# Patient Record
Sex: Male | Born: 1935
Health system: Southern US, Community
[De-identification: ages and names within clinical notes are randomized; demographics above are authoritative.]

## PROBLEM LIST (undated history)

## (undated) DIAGNOSIS — G4733 Obstructive sleep apnea (adult) (pediatric): Secondary | ICD-10-CM

## (undated) DIAGNOSIS — F419 Anxiety disorder, unspecified: Secondary | ICD-10-CM

## (undated) DIAGNOSIS — I4819 Other persistent atrial fibrillation: Secondary | ICD-10-CM

## (undated) DIAGNOSIS — G4700 Insomnia, unspecified: Secondary | ICD-10-CM

## (undated) DIAGNOSIS — I350 Nonrheumatic aortic (valve) stenosis: Secondary | ICD-10-CM

## (undated) DIAGNOSIS — Z8679 Personal history of other diseases of the circulatory system: Secondary | ICD-10-CM

## (undated) DIAGNOSIS — R569 Unspecified convulsions: Secondary | ICD-10-CM

## (undated) DIAGNOSIS — E785 Hyperlipidemia, unspecified: Secondary | ICD-10-CM

## (undated) DIAGNOSIS — J449 Chronic obstructive pulmonary disease, unspecified: Secondary | ICD-10-CM

## (undated) DIAGNOSIS — S065X0A Traumatic subdural hemorrhage without loss of consciousness, initial encounter: Secondary | ICD-10-CM

## (undated) HISTORY — DX: Morbid (severe) obesity due to excess calories: E66.01

## (undated) HISTORY — DX: Obstructive sleep apnea (adult) (pediatric): G47.33

## (undated) HISTORY — PX: APPENDECTOMY: SHX54

## (undated) HISTORY — DX: Hyperlipidemia, unspecified: E78.5

## (undated) HISTORY — PX: OTHER SURGICAL HISTORY: SHX169

## (undated) HISTORY — DX: Insomnia, unspecified: G47.00

## (undated) HISTORY — DX: Nonrheumatic aortic (valve) stenosis: I35.0

## (undated) HISTORY — DX: Other persistent atrial fibrillation: I48.19

## (undated) HISTORY — DX: Traumatic subdural hemorrhage without loss of consciousness, initial encounter: S06.5X0A

## (undated) HISTORY — DX: Chronic obstructive pulmonary disease, unspecified: J44.9

## (undated) HISTORY — DX: Anxiety disorder, unspecified: F41.9

## (undated) HISTORY — DX: Personal history of other diseases of the circulatory system: Z86.79

## (undated) HISTORY — DX: Unspecified convulsions: R56.9

## (undated) HISTORY — PX: REPLACEMENT TOTAL KNEE BILATERAL: SUR1225

## (undated) HISTORY — PX: CAROTID ENDARTERECTOMY: SUR193

---

## 2009-10-20 ENCOUNTER — Emergency Department (HOSPITAL_COMMUNITY): Admission: EM | Admit: 2009-10-20 | Discharge: 2009-10-20 | Payer: Self-pay | Admitting: Emergency Medicine

## 2011-01-27 LAB — DIFFERENTIAL
Lymphs Abs: 1.3 10*3/uL (ref 0.7–4.0)
Monocytes Absolute: 0.5 10*3/uL (ref 0.1–1.0)
Monocytes Relative: 7 % (ref 3–12)
Neutro Abs: 5.5 10*3/uL (ref 1.7–7.7)
Neutrophils Relative %: 74 % (ref 43–77)

## 2011-01-27 LAB — CBC
Hemoglobin: 14.6 g/dL (ref 13.0–17.0)
MCV: 100.7 fL — ABNORMAL HIGH (ref 78.0–100.0)
RBC: 4.36 MIL/uL (ref 4.22–5.81)
WBC: 7.4 10*3/uL (ref 4.0–10.5)

## 2011-01-27 LAB — BASIC METABOLIC PANEL
Calcium: 9 mg/dL (ref 8.4–10.5)
Chloride: 105 mEq/L (ref 96–112)
Creatinine, Ser: 0.79 mg/dL (ref 0.4–1.5)
GFR calc Af Amer: >60 mL/min (ref >60)
Sodium: 140 mEq/L (ref 135–145)

## 2011-01-27 LAB — PROTIME-INR: INR: 1.11 (ref 0.00–1.49)

## 2011-01-27 LAB — APTT: aPTT: 27 seconds (ref 24–37)

## 2011-06-19 LAB — PULMONARY FUNCTION TEST

## 2011-07-21 DIAGNOSIS — I2510 Atherosclerotic heart disease of native coronary artery without angina pectoris: Secondary | ICD-10-CM

## 2011-07-25 ENCOUNTER — Encounter: Payer: Self-pay | Admitting: Surgery

## 2011-07-28 ENCOUNTER — Encounter: Payer: Self-pay | Admitting: Surgery

## 2011-07-28 ENCOUNTER — Institutional Professional Consult (permissible substitution) (INDEPENDENT_AMBULATORY_CARE_PROVIDER_SITE_OTHER): Payer: Medicare Other | Admitting: Surgery

## 2011-07-28 DIAGNOSIS — I350 Nonrheumatic aortic (valve) stenosis: Secondary | ICD-10-CM

## 2011-07-28 DIAGNOSIS — I359 Nonrheumatic aortic valve disorder, unspecified: Secondary | ICD-10-CM

## 2011-07-28 DIAGNOSIS — I2510 Atherosclerotic heart disease of native coronary artery without angina pectoris: Secondary | ICD-10-CM

## 2011-07-28 DIAGNOSIS — E782 Mixed hyperlipidemia: Secondary | ICD-10-CM | POA: Insufficient documentation

## 2011-07-28 DIAGNOSIS — Z952 Presence of prosthetic heart valve: Secondary | ICD-10-CM | POA: Insufficient documentation

## 2011-07-28 NOTE — Progress Notes (Signed)
PCP is No primary provider on file. Referring Provider is Eliot Ford, MD  Chief Complaint  Patient presents with  . Aortic Stenosis  . Coronary Artery Disease    HPI:  The patient is a 77 year old gentleman who presents with a three-month history of increasing chest tightness and dyspnea with exertion. This has progressed and is occurring with less exertion. If he walks out to his mailbox, which is less than 25 yards away from his house, he will have symptoms. He's had no symptoms at rest.  Past Medical History  Diagnosis Date  . COPD (chronic obstructive pulmonary disease)   . Hyperlipidemia   . Morbid obesity     weight 243 pounds, BMI 31.2kg/m2, BSA 2.36 square meters  . Obstructive sleep apnea   . History of coronary artery disease     status post stenting of the marginal circumflex in 12/2003 and again in 2009  . Aortic stenosis     Past Surgical History  Procedure Date  . Appendectomy   . Replacement total knee bilateral     2006  . Carotid endarterectomy   . Permanent pacemaker     for complete heart block with syncope    History reviewed. No pertinent family history.  Social History History  Substance Use Topics  . Smoking status: Never Smoker   . Smokeless tobacco: Not on file  . Alcohol Use: Yes     drinks moderately    Current Outpatient Prescriptions  Medication Sig Dispense Refill  . ALPRAZolam (XANAX) 0.25 MG tablet Take 0.25 mg by mouth daily.        . celecoxib (CELEBREX) 200 MG capsule Take 200 mg by mouth daily.        . clopidogrel (PLAVIX) 75 MG tablet Take 75 mg by mouth daily.        Marland Kitchen ezetimibe (ZETIA) 10 MG tablet Take 10 mg by mouth daily.        Marland Kitchen RAMIPRIL PO 5 mg once per day       . zolpidem (AMBIEN) 10 MG tablet 10 mg 1/2-1 tablet at bedtime for sleep         No Known Allergies  Review of Systems:  Gen.-He denies any fever or chills. He has had no recent weight changes. He does report fatigue. Eyes-negative ENT-negative.  He does see a dentist regularly. Endocrine-he denies diabetes and hypothyroidism. Cardiovascular-as noted above he has exertional substernal chest pressure and dyspnea. He denies PND and orthopnea. He has had no peripheral edema or palpitations. Respiratory-he denies cough and sputum production. GI-he denies melena and bright red blood per rectum. GU-he denies dysuria and hematuria. Musculoskeletal-he denies arthralgias and myalgias. He has a history of degenerative joint disease affecting both knees. Neurological-he denies any focal weakness or numbness. He denies dizziness and syncope. He did have a history of a TIA in the past but has not had none since carotid endarterectomy was performed.  There were no vitals taken for this visit. Physical Exam:  Gen.-he is a well-developed white male in no distress. HEENT-he is normocephalic and atraumatic. Pupils are equal and reactive to light and accommodation. Extraocular muscles are intact. Oropharynx is clear. Neck-carotid pulses are palpable bilaterally. There is a transmitted murmur to both sides of his neck. There is no adenopathy or thyromegaly. Cardiac-regular rate and rhythm with normal S1 and S2. Harsh grade 4/6 systolic murmur of aortic stenosis. Lungs-clear. Abdomen-positive bowel sounds. Soft and nontender. There no palpable masses or organomegaly. Extremities-pedal pulses are  palpable bilaterally. There is no edema. Neurological-alert and oriented x3. Motor and sensory exams are grossly normal.  Diagnostic Tests:  Stress nuclear exam showed a small fixed inferolateral defect with a medium-sized reversible defect in the adjacent posterior lateral wall. Left ventricular ejection fraction 56%.  2-D echocardiogram dated 06/24/2011 shows an aortic valve area of 0.9 cm. Mean gradient is 35 and peak gradient was 50. Body surface area is 2.34.  Cardiac catheterization shows a 60-70% eccentric mid LAD stenosis just before a large diagonal  branch. There is high-grade in-stent restenosis in the mid left circumflex coronary artery affecting 2 large obtuse marginal branches. The right coronary artery has nonobstructive disease. Mean gradient was measured at 34 and peak gradient at 41.  Carotid Doppler examination shows 1-39% right internal carotid artery stenosis. There is 40-59% left internal carotid artery stenosis.    Impression:  The patient has severe aortic stenosis and high-grade in-stent restenosis in the left circumflex coronary system as well as a significant mid LAD stenosis. I agree that coronary bypass graft surgery and aortic valve replacement is the best treatment for this patient to prevent further progression of symptoms, coronary ischemia, and death. This will also improve his quality of life. I will plan to use a tissue valve given his age. I discussed the operative procedure with the patient and his wife and family with Dr. Lollie Sails in attendance who is a close family friend. I discussed alternatives, benefits, and risks, including but not limited to bleeding, blood transfusion, infection, stroke, myocardial infarction, graft failure, and death. He understands and agrees to proceed.  Plan:  We will plan to perform surgery on Tuesday, 08/05/2011. He already took his Plavix today but will discontinue it starting tomorrow.

## 2011-08-05 DIAGNOSIS — I359 Nonrheumatic aortic valve disorder, unspecified: Secondary | ICD-10-CM

## 2011-08-05 DIAGNOSIS — I2510 Atherosclerotic heart disease of native coronary artery without angina pectoris: Secondary | ICD-10-CM

## 2011-08-05 HISTORY — PX: AORTIC VALVE REPLACEMENT (AVR)/CORONARY ARTERY BYPASS GRAFTING (CABG): SHX5725

## 2011-09-17 ENCOUNTER — Ambulatory Visit (INDEPENDENT_AMBULATORY_CARE_PROVIDER_SITE_OTHER): Payer: Medicare Other | Admitting: Physician Assistant

## 2011-09-17 VITALS — BP 118/58 | HR 84 | Resp 18

## 2011-09-17 DIAGNOSIS — Z951 Presence of aortocoronary bypass graft: Secondary | ICD-10-CM

## 2011-09-17 DIAGNOSIS — Z952 Presence of prosthetic heart valve: Secondary | ICD-10-CM

## 2011-09-17 DIAGNOSIS — I2510 Atherosclerotic heart disease of native coronary artery without angina pectoris: Secondary | ICD-10-CM

## 2011-09-17 NOTE — Progress Notes (Signed)
Mr. Rockney Grenz returned to the office today for scheduled followup after aortic valve replacement and coronary bypass grafting by Dr. Arvilla Market approximately 6 weeks ago. Postoperatively he developed atrial fibrillation treated with amiodarone resulting in conversion back to sinus rhythm. Since his discharge home he is continuing to make steady progress. He denies any pain. He continues to have some fatigue and shortness of breath which he describes as being near his baseline. He does have a history of chronic obstructive pulmonary disease. He denies any angina.   Vital signs: Blood pressure 118/58 pulse 84 respirations 18   Medications include aspirin 81 mg by mouth daily Lipitor 80 mg by mouth daily Coumadin 2.5 mg by mouth daily amiodarone 200 mg by mouth twice a day Celebrex 200 mg by mouth daily and vitamin B 12  On exam the heart is in a regular rate and rhythm. There is an expected systolic murmur consistent with the bioprosthetic aortic valve. Breath sounds are full and clear to auscultation. The sternotomy incision is well healed. The sternum is stable. He has no peripheral edema. There is a small fluid collection at the left knee saphenous harvest site. This is not inflamed and is nontender. He has no lower extremity edema.  Impression: Satisfactory progress following aortic valve replacement and coronary bypass grafting in this 77 year old gentleman. Sternal precautions are reviewed. The importance of careful monitoring of PT and INR is discussed with the patient. He is currently being followed by [facility]. He has scheduled followup next week with Dr.Al Khori. No further followup is scheduled with our office but he understands he may contact us at any time should any other problems arise related to his surgery.

## 2013-03-16 ENCOUNTER — Encounter: Payer: Self-pay | Admitting: Cardiovascular Disease

## 2013-03-16 ENCOUNTER — Telehealth: Payer: Self-pay | Admitting: Cardiovascular Disease

## 2013-03-16 ENCOUNTER — Ambulatory Visit (INDEPENDENT_AMBULATORY_CARE_PROVIDER_SITE_OTHER): Payer: Medicare Other | Admitting: Cardiovascular Disease

## 2013-03-16 VITALS — BP 100/50 | HR 69 | Ht 74.0 in | Wt 238.0 lb

## 2013-03-16 DIAGNOSIS — I359 Nonrheumatic aortic valve disorder, unspecified: Secondary | ICD-10-CM

## 2013-03-16 DIAGNOSIS — I350 Nonrheumatic aortic (valve) stenosis: Secondary | ICD-10-CM

## 2013-03-16 DIAGNOSIS — I2510 Atherosclerotic heart disease of native coronary artery without angina pectoris: Secondary | ICD-10-CM

## 2013-03-16 LAB — CBC WITH DIFFERENTIAL/PLATELET
Basophils Relative: 0.8 % (ref 0.0–3.0)
Eosinophils Relative: 1.4 % (ref 0.0–5.0)
HCT: 41.7 % (ref 39.0–52.0)
Hemoglobin: 14.3 g/dL (ref 13.0–17.0)
Lymphs Abs: 1.3 10*3/uL (ref 0.7–4.0)
MCV: 95.3 fl (ref 78.0–100.0)
Monocytes Absolute: 0.6 10*3/uL (ref 0.1–1.0)
Neutro Abs: 4.5 10*3/uL (ref 1.4–7.7)
Neutrophils Relative %: 68.7 % (ref 43.0–77.0)
RBC: 4.38 Mil/uL (ref 4.22–5.81)
WBC: 6.5 10*3/uL (ref 4.5–10.5)

## 2013-03-16 LAB — BASIC METABOLIC PANEL
Chloride: 108 mEq/L (ref 96–112)
Potassium: 4 mEq/L (ref 3.5–5.1)

## 2013-03-16 LAB — PROTIME-INR: INR: 1.1 ratio — ABNORMAL HIGH (ref 0.8–1.0)

## 2013-03-16 NOTE — Telephone Encounter (Signed)
ROI faxed to [facility] [phone] 03/16/13/KM

## 2013-03-16 NOTE — Patient Instructions (Addendum)
Your physician has requested that you have a cardiac catheterization. Cardiac catheterization is used to diagnose and/or treat various heart conditions. Doctors may recommend this procedure for a number of different reasons. The most common reason is to evaluate chest pain. Chest pain can be a symptom of coronary artery disease (CAD), and cardiac catheterization can show whether plaque is narrowing or blocking your heart's arteries. This procedure is also used to evaluate the valves, as well as measure the blood flow and oxygen levels in different parts of your heart. For further information please visit www.cardiosmart.org. Please follow instruction sheet, as given.  Your physician recommends that you continue on your current medications as directed. Please refer to the Current Medication list given to you today.  Your physician recommends that you have lab work today: BMP, CBC, PT/INR   

## 2013-03-16 NOTE — Telephone Encounter (Signed)
Records rec From [facility] gave to [facility] 03/16/13/KM

## 2013-03-16 NOTE — Progress Notes (Signed)
HPI:  [name] is an 79 year old gentleman presenting for initial evaluation. The patient has been followed in [facility]. He has a history of coronary artery disease dating back several years. He also has vascular disease with history of right carotid endarterectomy. He underwent coronary stenting in the past. He's undergone stenting of the left circumflex/obtuse marginal in 2005 and again in 2009. He also had permanent pacemaker placement several years ago and his wife tells me that he was having syncope with prolonged pauses. He ultimately developed symptomatic aortic stenosis and coronary disease and underwent aortic valve replacement with a bioprosthesis and three-vessel CABG in October 2012.  He has done fairly well since his bypass surgery. He had a postoperative atrial fibrillation and was treated with amiodarone and warfarin. This has all resolved and he's been off of warfarin since 6 months from his surgery. He no longer takes amiodarone.  He recently has developed left arm pain from the upper arm down into the forearm. This occurs unpredictably. There is no associated chest pain. There is associated epigastric discomfort with this. There is symptom relief with belching. He denies exertional chest pain or pressure, edema, orthopnea, PND, lightheadedness, syncope, or palpitations. He does admit to dyspnea with exertion, and this has worsened over the last several months.  The patient underwent a nuclear stress scan 03/04/2013. This was a pharmacologic study. This showed a fixed, moderate sized inferoposterior defect as well as mild reversibility with peri-infarct ischemia in the distal lateral wall. The left ventricular ejection fraction was reduced at 48%. This was interpreted as a low to intermediate risk study.  Outpatient Encounter Prescriptions as of 03/16/2013  Medication Sig Dispense Refill  . ALPRAZolam (XANAX) 0.25 MG tablet Take 0.25 mg by mouth daily.        Marland Kitchen aspirin 81 MG tablet  Take 81 mg by mouth daily.      Marland Kitchen atorvastatin (LIPITOR) 80 MG tablet Take 80 mg by mouth daily.       . celecoxib (CELEBREX) 200 MG capsule Take 200 mg by mouth daily.        . furosemide (LASIX) 20 MG tablet Take 20 mg by mouth daily.       Marland Kitchen zolpidem (AMBIEN) 10 MG tablet 10 mg 1/2-1 tablet at bedtime for sleep       . fluorouracil (EFUDEX) 5 % cream       . LINZESS 145 MCG CAPS       . [DISCONTINUED] clopidogrel (PLAVIX) 75 MG tablet Take 75 mg by mouth daily.        . [DISCONTINUED] ezetimibe (ZETIA) 10 MG tablet Take 10 mg by mouth daily.        . [DISCONTINUED] RAMIPRIL PO 5 mg once per day        No facility-administered encounter medications on file as of 03/16/2013.    Review of patient's allergies indicates no known allergies.  Past Medical History  Diagnosis Date  . COPD (chronic obstructive pulmonary disease)   . Hyperlipidemia   . Morbid obesity     weight 243 pounds, BMI 31.2kg/m2, BSA 2.36 square meters  . Obstructive sleep apnea   . History of coronary artery disease     status post stenting of the marginal circumflex in 12/2003 and again in 2009  . Aortic stenosis     Past Surgical History  Procedure Laterality Date  . Appendectomy    . Replacement total knee bilateral      2006  . Carotid  endarterectomy    . Permanent pacemaker      for complete heart block with syncope  . Aortic valve replacement (avr)/coronary artery bypass grafting (cabg)   08/05/2011    LIMA to LAD, sequential saphenous vein graft to third and fourth obtuse marginal branches of the circumflex, aortic valve replacement using a 23 mm Edwards pericardial valve    History   Social History  . Marital Status: Married    Spouse Name: N/A    Number of Children: N/A  . Years of Education: N/A   Occupational History  . Not on file.   Social History Main Topics  . Smoking status: Former Games developer  . Smokeless tobacco: Not on file  . Alcohol Use: Yes     Comment: drinks moderately  . Drug  Use: Not on file  . Sexually Active: Not on file   Other Topics Concern  . Not on file   Social History Narrative  . No narrative on file   Family history: Negative for premature CAD  ROS:  General: no fevers/chills/night sweats Eyes: no blurry vision, diplopia, or amaurosis ENT: no sore throat or hearing loss Resp: no cough, wheezing, or hemoptysis CV: no edema or palpitations GI: no abdominal pain, nausea, vomiting, diarrhea, or constipation GU: no dysuria, frequency, or hematuria Skin: no rash Neuro: no headache, numbness, tingling, or weakness of extremities Musculoskeletal: no joint pain or swelling Heme: no bleeding, DVT, or easy bruising Endo: no polydipsia or polyuria  BP 100/50  Pulse 69  Ht 6\' 2"  (1.88 m)  Wt 107.956 kg (238 lb)  BMI 30.54 kg/m2  PHYSICAL EXAM: Pt is alert and oriented, WD, WN, pleasant overweight male in no distress. HEENT: normal Neck: JVP normal. Carotid upstrokes normal with a left carotid bruit. There is a right carotid endarterectomy scar with no bruit over the right carotid. No thyromegaly. Lungs: equal expansion, clear bilaterally CV: Apex is discrete and nondisplaced, RRR with a grade 2/6 systolic ejection murmur at the right upper sternal border Abd: soft, NT, +BS, no bruit, no hepatosplenomegaly Back: no CVA tenderness Ext: no C/C/E        DP/PT pulses intact and = Skin: warm and dry without rash Neuro: CNII-XII intact             Strength intact = bilaterally  EKG: Sinus rhythm with first-degree AV block, PR interval 214, within normal limits.  ASSESSMENT AND PLAN: 1. Coronary artery disease with history of CABG, now with symptoms of left arm pain and an abnormal stress test with infarction and peri-infarct ischemia as well as mild LV dysfunction. He has CCS class II symptoms, primarily with dyspnea as a possible anginal equivalent. He also has left arm pain which is somewhat atypical.  2. Aortic stenosis status post  bioprosthetic aortic valve replacement. Will try to obtain records to see if he has had an echocardiogram since his surgery. His exam shows an early systolic murmur consistent with his aortic valve replacement, but is not suggestive of significant residual aortic stenosis.  3. Hyperlipidemia. He is on high-dose atorvastatin and tells me his lipids are under excellent control.  I have reviewed potential diagnostic and treatment options with the patient and his wife. Considering his abnormal nuclear scan, progressive dyspnea, known CAD status post CABG, and new left arm pain with associated indigestion, I have recommended a cardiac catheterization and possible PCI. I have reviewed the specific risks, indications, and alternatives. Risks include vascular injury, bleeding, stroke, myocardial infarction, and death.  They understand these risks are low and agree to proceed. We'll plan a left radial approach. His Allen's test is normal.  Tonny Bollman 03/16/2013 9:24 PM

## 2013-03-21 ENCOUNTER — Other Ambulatory Visit: Payer: Self-pay | Admitting: Cardiovascular Disease

## 2013-03-21 DIAGNOSIS — I2510 Atherosclerotic heart disease of native coronary artery without angina pectoris: Secondary | ICD-10-CM

## 2013-03-22 ENCOUNTER — Encounter (HOSPITAL_BASED_OUTPATIENT_CLINIC_OR_DEPARTMENT_OTHER)
Admission: RE | Disposition: A | Discharge status: Home / Self Care | Payer: Self-pay | Source: Ambulatory Visit | Attending: Cardiovascular Disease

## 2013-03-22 ENCOUNTER — Inpatient Hospital Stay (HOSPITAL_BASED_OUTPATIENT_CLINIC_OR_DEPARTMENT_OTHER)
Admission: RE | Admit: 2013-03-22 | Discharge: 2013-03-22 | Disposition: A | Discharge status: Home / Self Care | Payer: Medicare Other | Source: Ambulatory Visit | Attending: Cardiovascular Disease | Admitting: Cardiovascular Disease

## 2013-03-22 DIAGNOSIS — I2582 Chronic total occlusion of coronary artery: Secondary | ICD-10-CM | POA: Insufficient documentation

## 2013-03-22 DIAGNOSIS — I2510 Atherosclerotic heart disease of native coronary artery without angina pectoris: Secondary | ICD-10-CM | POA: Insufficient documentation

## 2013-03-22 DIAGNOSIS — R0989 Other specified symptoms and signs involving the circulatory and respiratory systems: Secondary | ICD-10-CM | POA: Insufficient documentation

## 2013-03-22 DIAGNOSIS — E785 Hyperlipidemia, unspecified: Secondary | ICD-10-CM | POA: Insufficient documentation

## 2013-03-22 DIAGNOSIS — Z952 Presence of prosthetic heart valve: Secondary | ICD-10-CM | POA: Insufficient documentation

## 2013-03-22 DIAGNOSIS — R9439 Abnormal result of other cardiovascular function study: Secondary | ICD-10-CM | POA: Insufficient documentation

## 2013-03-22 DIAGNOSIS — Z951 Presence of aortocoronary bypass graft: Secondary | ICD-10-CM | POA: Insufficient documentation

## 2013-03-22 DIAGNOSIS — R0609 Other forms of dyspnea: Secondary | ICD-10-CM | POA: Insufficient documentation

## 2013-03-22 SURGERY — JV LEFT HEART CATHETERIZATION WITH CORONARY/GRAFT ANGIOGRAM

## 2013-03-22 MED ORDER — SODIUM CHLORIDE 0.9 % IV SOLN: 1.0000 mL/kg/h | INTRAVENOUS | Status: DC

## 2013-03-22 MED ORDER — DIAZEPAM 5 MG PO TABS
5.0000 mg | ORAL_TABLET | ORAL | Status: AC
  Administered 2013-03-22: 5 mg via ORAL

## 2013-03-22 MED ORDER — ACETAMINOPHEN 325 MG PO TABS: 650.0000 mg | ORAL_TABLET | ORAL | Status: DC | PRN

## 2013-03-22 MED ORDER — ASPIRIN 81 MG PO CHEW
324.0000 mg | CHEWABLE_TABLET | ORAL | Status: AC
  Administered 2013-03-22: 324 mg via ORAL

## 2013-03-22 MED ORDER — SODIUM CHLORIDE 0.9 % IV SOLN: 250.0000 mL | INTRAVENOUS | Status: DC | PRN

## 2013-03-22 MED ORDER — SODIUM CHLORIDE 0.9 % IV SOLN: INTRAVENOUS | Status: DC

## 2013-03-22 MED ORDER — ONDANSETRON HCL 4 MG/2ML IJ SOLN: 4.0000 mg | Freq: Four times a day (QID) | INTRAMUSCULAR | Status: DC | PRN

## 2013-03-22 MED ORDER — SODIUM CHLORIDE 0.9 % IJ SOLN: 3.0000 mL | Freq: Two times a day (BID) | INTRAMUSCULAR | Status: DC

## 2013-03-22 MED ORDER — SODIUM CHLORIDE 0.9 % IJ SOLN: 3.0000 mL | INTRAMUSCULAR | Status: DC | PRN

## 2013-03-22 NOTE — H&P (View-Only) (Signed)
 HPI:  [name] is an 79-year-old gentleman presenting for initial evaluation. The patient has been followed in [place]. He has a history of coronary artery disease dating back several years. He also has vascular disease with history of right carotid endarterectomy. He underwent coronary stenting in the past. He's undergone stenting of the left circumflex/obtuse marginal in 2005 and again in 2009. He also had permanent pacemaker placement several years ago and his wife tells me that he was having syncope with prolonged pauses. He ultimately developed symptomatic aortic stenosis and coronary disease and underwent aortic valve replacement with a bioprosthesis and three-vessel CABG in October 2012.  He has done fairly well since his bypass surgery. He had a postoperative atrial fibrillation and was treated with amiodarone and warfarin. This has all resolved and he's been off of warfarin since 6 months from his surgery. He no longer takes amiodarone.  He recently has developed left arm pain from the upper arm down into the forearm. This occurs unpredictably. There is no associated chest pain. There is associated epigastric discomfort with this. There is symptom relief with belching. He denies exertional chest pain or pressure, edema, orthopnea, PND, lightheadedness, syncope, or palpitations. He does admit to dyspnea with exertion, and this has worsened over the last several months.  The patient underwent a nuclear stress scan 03/04/2013. This was a pharmacologic study. This showed a fixed, moderate sized inferoposterior defect as well as mild reversibility with peri-infarct ischemia in the distal lateral wall. The left ventricular ejection fraction was reduced at 48%. This was interpreted as a low to intermediate risk study.  Outpatient Encounter Prescriptions as of 03/16/2013  Medication Sig Dispense Refill  . ALPRAZolam (XANAX) 0.25 MG tablet Take 0.25 mg by mouth daily.        . aspirin 81 MG tablet  Take 81 mg by mouth daily.      . atorvastatin (LIPITOR) 80 MG tablet Take 80 mg by mouth daily.       . celecoxib (CELEBREX) 200 MG capsule Take 200 mg by mouth daily.        . furosemide (LASIX) 20 MG tablet Take 20 mg by mouth daily.       . zolpidem (AMBIEN) 10 MG tablet 10 mg 1/2-1 tablet at bedtime for sleep       . fluorouracil (EFUDEX) 5 % cream       . LINZESS 145 MCG CAPS       . [DISCONTINUED] clopidogrel (PLAVIX) 75 MG tablet Take 75 mg by mouth daily.        . [DISCONTINUED] ezetimibe (ZETIA) 10 MG tablet Take 10 mg by mouth daily.        . [DISCONTINUED] RAMIPRIL PO 5 mg once per day        No facility-administered encounter medications on file as of 03/16/2013.    Review of patient's allergies indicates no known allergies.  Past Medical History  Diagnosis Date  . COPD (chronic obstructive pulmonary disease)   . Hyperlipidemia   . Morbid obesity     weight 243 pounds, BMI 31.2kg/m2, BSA 2.36 square meters  . Obstructive sleep apnea   . History of coronary artery disease     status post stenting of the marginal circumflex in 12/2003 and again in 2009  . Aortic stenosis     Past Surgical History  Procedure Laterality Date  . Appendectomy    . Replacement total knee bilateral      2006  . Carotid   endarterectomy    . Permanent pacemaker      for complete heart block with syncope  . Aortic valve replacement (avr)/coronary artery bypass grafting (cabg)   08/05/2011    LIMA to LAD, sequential saphenous vein graft to third and fourth obtuse marginal branches of the circumflex, aortic valve replacement using a 23 mm Edwards pericardial valve    History   Social History  . Marital Status: Married    Spouse Name: N/A    Number of Children: N/A  . Years of Education: N/A   Occupational History  . Not on file.   Social History Main Topics  . Smoking status: Former Smoker  . Smokeless tobacco: Not on file  . Alcohol Use: Yes     Comment: drinks moderately  . Drug  Use: Not on file  . Sexually Active: Not on file   Other Topics Concern  . Not on file   Social History Narrative  . No narrative on file   Family history: Negative for premature CAD  ROS:  General: no fevers/chills/night sweats Eyes: no blurry vision, diplopia, or amaurosis ENT: no sore throat or hearing loss Resp: no cough, wheezing, or hemoptysis CV: no edema or palpitations GI: no abdominal pain, nausea, vomiting, diarrhea, or constipation GU: no dysuria, frequency, or hematuria Skin: no rash Neuro: no headache, numbness, tingling, or weakness of extremities Musculoskeletal: no joint pain or swelling Heme: no bleeding, DVT, or easy bruising Endo: no polydipsia or polyuria  BP 100/50  Pulse 69  Ht 6' 2" (1.88 m)  Wt 107.956 kg (238 lb)  BMI 30.54 kg/m2  PHYSICAL EXAM: Pt is alert and oriented, WD, WN, pleasant overweight male in no distress. HEENT: normal Neck: JVP normal. Carotid upstrokes normal with a left carotid bruit. There is a right carotid endarterectomy scar with no bruit over the right carotid. No thyromegaly. Lungs: equal expansion, clear bilaterally CV: Apex is discrete and nondisplaced, RRR with a grade 2/6 systolic ejection murmur at the right upper sternal border Abd: soft, NT, +BS, no bruit, no hepatosplenomegaly Back: no CVA tenderness Ext: no C/C/E        DP/PT pulses intact and = Skin: warm and dry without rash Neuro: CNII-XII intact             Strength intact = bilaterally  EKG: Sinus rhythm with first-degree AV block, PR interval 214, within normal limits.  ASSESSMENT AND PLAN: 1. Coronary artery disease with history of CABG, now with symptoms of left arm pain and an abnormal stress test with infarction and peri-infarct ischemia as well as mild LV dysfunction. He has CCS class II symptoms, primarily with dyspnea as a possible anginal equivalent. He also has left arm pain which is somewhat atypical.  2. Aortic stenosis status post  bioprosthetic aortic valve replacement. Will try to obtain records to see if he has had an echocardiogram since his surgery. His exam shows an early systolic murmur consistent with his aortic valve replacement, but is not suggestive of significant residual aortic stenosis.  3. Hyperlipidemia. He is on high-dose atorvastatin and tells me his lipids are under excellent control.  I have reviewed potential diagnostic and treatment options with the patient and his wife. Considering his abnormal nuclear scan, progressive dyspnea, known CAD status post CABG, and new left arm pain with associated indigestion, I have recommended a cardiac catheterization and possible PCI. I have reviewed the specific risks, indications, and alternatives. Risks include vascular injury, bleeding, stroke, myocardial infarction, and death.   They understand these risks are low and agree to proceed. We'll plan a left radial approach. His Allen's test is normal.  Lateshia Schmoker 03/16/2013 9:24 PM      

## 2013-03-22 NOTE — OR Nursing (Signed)
Dr Cooper at bedside to discuss results and treatment plan with pt and family 

## 2013-03-22 NOTE — CV Procedure (Signed)
   Cardiac Catheterization Procedure Note  Name: Alex [name] MRN: [id number] DOB: 12-Aug-1931  Procedure: Selective Coronary Angiography, saphenous vein graft angiography, LIMA angiography.  Indication: Shortness of breath, abnormal Myoview scan, 79 year old gentleman status post aortic valve replacement and three-vessel bypass with the LIMA to LAD and sequential saphenous vein graft to OM 3 ML and 4.   Procedural details: The left radial site was prepped, draped, and anesthetized with 1% lidocaine. Using modified Seldinger technique, a 5 French sheath was introduced into the left radial artery. Standard Judkins catheters were used for coronary angiography, saphenous vein graft angiography, and LIMA angiography. The native right coronary artery was engaged with the LIMA catheter, the left internal mammary artery was engaged with the LIMA catheter, and the sequential saphenous vein graft to OM 3 an OM 4 was engaged with an AL-1 catheter. The native left coronary artery was engaged with a JL4 catheter. Catheter exchanges were performed over a guidewire. There were no immediate procedural complications. The patient was transferred to the post catheterization recovery area for further monitoring.  Procedural Findings: Hemodynamics:  AO 100/70 LV not recorded   Coronary angiography: Coronary dominance: right  Left mainstem: The left main is patent. There is no obstructive disease.  Left anterior descending (LAD): The ostial LAD has heavy calcification with a large filling defect and tight 90% stenosis. At the first septal perforator the LAD fills competitively from the mammary artery.  Left circumflex (LCx): The left circumflex is totally occluded. There is heavy calcification present. There is a small intermediate branch with 80% proximal stenosis   Right coronary artery (RCA): The RCA is dominant. The vessel has 50% proximal stenosis. There is diffuse irregularity throughout. The ostium and  proximal vessel are heavily calcified. There is no pressure damping with the catheter engaged. The mid vessel has 30-40% stenosis. The PDA is patent.  LIMA to LAD: Widely patent with no obstructive disease. The LIMA is large in caliber. The mid LAD beyond the graft insertion site is widely patent. The first diagonal fills retrograde from the graft with no significant stenoses.  Saphenous vein graft sequential to OM 3 an OM 4: Widely patent throughout. There are no significant stenoses within the graft. The distal anastomotic sites are widely patent. Both OM branches are free of significant disease. They are medium in caliber. The AV circumflex fills retrograde.  Left ventriculography: Not assessed  Final Conclusions:   1. Severe native two-vessel coronary artery disease with severe stenosis of the proximal LAD and total occlusion left circumflex 2. Mild to moderate proximal RCA stenosis 3. Status post aortocoronary bypass surgery with continued patency of the LIMA to LAD and saphenous vein graft sequential to OM 3 and OM 4  Tonny Bollman 03/22/2013, 9:47 AM

## 2013-03-22 NOTE — Interval H&P Note (Signed)
History and Physical Interval Note:  03/22/2013 8:45 AM  Alex [name]  has presented today for surgery, with the diagnosis of cp  The various methods of treatment have been discussed with the patient and family. After consideration of risks, benefits and other options for treatment, the patient has consented to  Procedure(s): JV LEFT HEART CATHETERIZATION WITH CORONARY/GRAFT ANGIOGRAM (N/A) as a surgical intervention .  The patient's history has been reviewed, patient examined, no change in status, stable for surgery.  I have reviewed the patient's chart and labs.  Questions were answered to the patient's satisfaction.     Tonny Bollman

## 2013-03-22 NOTE — OR Nursing (Signed)
Meal served 

## 2013-03-22 NOTE — OR Nursing (Signed)
+  Allen's test right hand 

## 2013-03-22 NOTE — OR Nursing (Signed)
TR band removed and pressure dressing with wrist splint applied, distal circulation intact, discharge instructions reviewed and signed, pt stated understanding, ambulated in hall without difficulty, transported to wife's car via wheelchair

## 2014-10-26 ENCOUNTER — Ambulatory Visit: Payer: Medicare Other | Admitting: Cardiovascular Disease

## 2014-10-31 ENCOUNTER — Encounter: Payer: Self-pay | Admitting: Cardiovascular Disease

## 2014-10-31 ENCOUNTER — Ambulatory Visit (INDEPENDENT_AMBULATORY_CARE_PROVIDER_SITE_OTHER): Payer: Medicare Other | Admitting: Cardiovascular Disease

## 2014-10-31 ENCOUNTER — Ambulatory Visit (INDEPENDENT_AMBULATORY_CARE_PROVIDER_SITE_OTHER): Payer: Medicare Other | Admitting: *Deleted

## 2014-10-31 VITALS — BP 142/80 | HR 73 | Ht 74.0 in | Wt 239.0 lb

## 2014-10-31 DIAGNOSIS — I350 Nonrheumatic aortic (valve) stenosis: Secondary | ICD-10-CM

## 2014-10-31 DIAGNOSIS — R0602 Shortness of breath: Secondary | ICD-10-CM | POA: Insufficient documentation

## 2014-10-31 DIAGNOSIS — I4819 Other persistent atrial fibrillation: Secondary | ICD-10-CM | POA: Insufficient documentation

## 2014-10-31 DIAGNOSIS — I4891 Unspecified atrial fibrillation: Secondary | ICD-10-CM

## 2014-10-31 DIAGNOSIS — Z950 Presence of cardiac pacemaker: Secondary | ICD-10-CM

## 2014-10-31 LAB — MDC_IDC_ENUM_SESS_TYPE_INCLINIC
Battery Voltage: 2.85 V
Brady Statistic AP VP Percent: 2.76 %
Brady Statistic AP VS Percent: 53.04 %
Brady Statistic AS VS Percent: 24.96 %
Brady Statistic RV Percent Paced: 22 %
Date Time Interrogation Session: 20160105221418
Lead Channel Sensing Intrinsic Amplitude: 1.9 mV
Lead Channel Sensing Intrinsic Amplitude: 6.5 mV
Lead Channel Setting Pacing Amplitude: 2.5 V
Lead Channel Setting Pacing Amplitude: 5 V
Lead Channel Setting Pacing Pulse Width: 1 ms
MDC IDC MSMT LEADCHNL RA IMPEDANCE VALUE: 368 Ohm
MDC IDC MSMT LEADCHNL RV IMPEDANCE VALUE: 1264 Ohm
MDC IDC MSMT LEADCHNL RV PACING THRESHOLD AMPLITUDE: 2.5 V
MDC IDC MSMT LEADCHNL RV PACING THRESHOLD PULSEWIDTH: 1 ms
MDC IDC SET LEADCHNL RV SENSING SENSITIVITY: 0.9 mV
MDC IDC SET ZONE DETECTION INTERVAL: 430 ms
MDC IDC STAT BRADY AS VP PERCENT: 19.24 %
MDC IDC STAT BRADY RA PERCENT PACED: 55.81 %
Zone Setting Detection Interval: 350 ms

## 2014-10-31 MED ORDER — WARFARIN SODIUM 2.5 MG PO TABS: 2.5000 mg | ORAL_TABLET | Freq: Every day | ORAL | Status: DC

## 2014-10-31 NOTE — Patient Instructions (Addendum)
Your physician recommends that have lab work in: TODAY (CBC; CMET; BNP)  Your physician has requested that you have an echocardiogram. Echocardiography is a painless test that uses sound waves to create images of your heart. It provides your doctor with information about the size and shape of your heart and how well your heart's chambers and valves are working. This procedure takes approximately one hour. There are no restrictions for this procedure.  You have been referred to [facility] for INR on 11/06/14  You have been referred to Electrophysiology for Atrial Fibrillation management and device follow-up.  Your physician recommends that you schedule a follow-up appointment in: 8 Weeks with Dr. Excell Seltzer.  Your physician has recommended you make the following change in your medication:  1) START Warfarin 2.5 mg: take one by mouth with evening meal   Warfarin: What You Need to Know Warfarin is an anticoagulant. Anticoagulants help prevent the formation of blood clots. They also help stop the growth of blood clots. Warfarin is sometimes referred to as a "blood thinner."  Normally, when body tissues are cut or damaged, the blood clots in order to prevent blood loss. Sometimes clots form inside your blood vessels and obstruct the flow of blood through your circulatory system (thrombosis). These clots may travel through your bloodstream and become lodged in smaller blood vessels in your brain, which can cause a stroke, or in your lungs (pulmonary embolism). WHO SHOULD USE WARFARIN? Warfarin is prescribed for people at risk of developing harmful blood clots:  People with surgically implanted mechanical heart valves, irregular heart rhythms called atrial fibrillation, and certain clotting disorders.  People who have developed harmful blood clotting in the past, including those who have had a stroke or a pulmonary embolism, or thrombosis in their legs (deep vein thrombosis [DVT]).  People  with an existing blood clot, such as a pulmonary embolism. WARFARIN DOSING Warfarin tablets come in different strengths. Each tablet strength is a different color, with the amount of warfarin (in milligrams) clearly printed on the tablet. If the color of your tablet is different than usual when you receive a new prescription, report it immediately to your pharmacist or health care provider. WARFARIN MONITORING The goal of warfarin therapy is to lessen the clotting tendency of blood but not prevent clotting completely. Your health care provider will monitor the anticoagulation effect of warfarin closely and adjust your dose as needed. For your safety, blood tests called prothrombin time (PT) or international normalized ratio (INR) are used to measure the effects of warfarin. Both of these tests can be done with a finger stick or a blood draw. The longer it takes the blood to clot, the higher the PT or INR. Your health care provider will inform you of your "target" PT or INR range. If, at any time, your PT or INR is above the target range, there is a risk of bleeding. If your PT or INR is below the target range, there is a risk of clotting. Whether you are started on warfarin while you are in the hospital or in your health care provider's office, you will need to have your PT or INR checked within one week of starting the medicine. Initially, some people are asked to have their PT or INR checked as much as twice a week. Once you are on a stable maintenance dose, the PT or INR is checked less often, usually once every 2 to 4 weeks. The warfarin dose may be adjusted if the PT or  INR is not within the target range. It is important to keep all laboratory and health care provider follow-up appointments. Not keeping appointments could result in a chronic or permanent injury, pain, or disability because warfarin is a medicine that requires close monitoring. WHAT ARE THE SIDE EFFECTS OF WARFARIN?  Too much warfarin  can cause bleeding (hemorrhage) from any part of the body. This may include bleeding from the gums, blood in the urine, bloody or dark stools, a nosebleed that is not easily stopped, coughing up blood, or vomiting blood.  Too little warfarin can increase the risk of blood clots.  Too little or too much warfarin can also increase the risk of a stroke.  Warfarin use may cause a skin rash or irritation, an unusual fever, continual nausea or stomach upset, or severe pain in your joints or back. SPECIAL PRECAUTIONS WHILE TAKING WARFARIN Warfarin should be taken exactly as directed. It is very important to take warfarin as directed since bleeding or blood clots could result in chronic or permanent injury, pain, or disability.  Take your medicine at the same time every day. If you forget to take your dose, you can take it if it is within 6 hours of when it was due.  Do not change the dose of warfarin on your own to make up for missed or extra doses.  If you miss more than 2 doses in a row, you should contact your health care provider for advice. Avoid situations that cause bleeding. You may have a tendency to bleed more easily than usual while taking warfarin. The following actions can limit bleeding:  Using a softer toothbrush.  Flossing with waxed floss rather than unwaxed floss.  Shaving with an Neurosurgeonelectric razor rather than a blade.  Limiting the use of sharp objects.  Avoiding potentially harmful activities, such as contact sports. Warfarin and Pregnancy or Breastfeeding  Warfarin is not advised during the first trimester of pregnancy due to an increased risk of birth defects. In certain situations, a woman may take warfarin after her first trimester of pregnancy. A woman who becomes pregnant or plans to become pregnant while taking warfarin should notify her health care provider immediately.  Although warfarin does not pass into breast milk, a woman who wishes to breastfeed while taking  warfarin should also consult with her health care provider. Alcohol, Smoking, and Illicit Drug Use  Alcohol affects how warfarin works in the body. It is best to avoid alcoholic drinks or consume very small amounts while taking warfarin. In general, alcohol intake should be limited to 1 oz (30 mL) of liquor, 6 oz (180 mL) of wine, or 12 oz (360 mL) of beer each day. Notify your health care provider if you change your alcohol intake.  Smoking affects how warfarin works. It is best to avoid smoking while taking warfarin. Notify your health care provider if you change your smoking habits.  It is best to avoid all illicit drugs while taking warfarin since there are few studies that show how warfarin interacts with these drugs. Other Medicines and Dietary Supplements Many prescription and over-the-counter medicines can interfere with warfarin. Be sure all of your health care providers know you are taking warfarin. Notify your health care provider who prescribed warfarin for you or your pharmacist before starting or stopping any new medicines, including over-the-counter vitamins, dietary supplements, and pain medicines. Your warfarin dose may need to be adjusted. Some common over-the-counter medicines that may increase the risk of bleeding while taking warfarin  include:   Acetaminophen.  Aspirin.  Nonsteroidal anti-inflammatory medicines (NSAIDs), such as ibuprofen or naproxen.  Vitamin E. Dietary Considerations  Foods that have moderate or high amounts of vitamin K can interfere with warfarin. Avoid major changes in your diet or notify your health care provider before changing your diet. Eat a consistent amount of foods that have moderate or high amounts of vitamin K. Eating less foods containing vitamin K can increase the risk of bleeding. Eating more foods containing vitamin K can increase the risk of blood clots. Additional questions about dietary considerations can be discussed with a  dietitian. Foods that are very high in vitamin K:  Greens, such as Swiss chard and beet, collard, mustard, or turnip greens (fresh or frozen, cooked).  Kale (fresh or frozen, cooked).  Parsley (raw).  Spinach (cooked). Foods that are high in vitamin K:  Asparagus (frozen, cooked).  Beans, green (frozen, cooked).  Broccoli.  Bok choy (cooked).  Brussels sprouts (fresh or frozen, cooked).  Cabbage (cooked).   Coleslaw. Foods that are moderately high in vitamin K:  Blueberries.  Black-eyed peas.  Endive (raw).  Green leaf lettuce (raw).  Green scallions (raw).  Kale (raw).  Okra (frozen, cooked).  Plantains (fried).  Romaine lettuce (raw).  Sauerkraut (canned).  Spinach (raw). CALL YOUR CLINIC OR HEALTH CARE PROVIDER IF YOU:  Plan to have any surgery or procedure.  Feel sick, especially if you have diarrhea or vomiting.  Experience or anticipate any major changes in your diet.  Start or stop a prescription or over-the-counter medicine.  Become, plan to become, or think you may be pregnant.  Are having heavier than usual menstrual periods.  Have had a fall, accident, or any symptoms of bleeding or unusual bruising.  Develop an unusual fever. CALL 911 IN THE [place] OR GO TO THE EMERGENCY DEPARTMENT IF YOU:   Think you may be having an allergic reaction to warfarin. The signs of an allergic reaction could include itching, rash, hives, swelling, chest tightness, or trouble breathing.  See signs of blood in your urine. The signs could include reddish, pinkish, or tea-colored urine.  See signs of blood in your stools. The signs could include bright red or black stools.  Vomit or cough up blood. In these instances, the blood could have either a bright red or a "coffee-grounds" appearance.  Have bleeding that will not stop after applying pressure for 30 minutes such as cuts, nosebleeds, or other injuries.  Have severe pain in your joints or  back.  Have a new and severe headache.  Have sudden weakness or numbness of your face, arm, or leg, especially on one side of your body.  Have sudden confusion or trouble understanding.  Have sudden trouble seeing in one or both eyes.  Have sudden trouble walking, dizziness, loss of balance, or coordination.  Have trouble speaking or understanding (aphasia). Document Released: 10/13/2005 Document Revised: 02/27/2014 Document Reviewed: 04/08/2013 [facility] Patient Information 2015 [place], [place]. This information is not intended to replace advice given to you by your health care provider. Make sure you discuss any questions you have with your health care provider.

## 2014-10-31 NOTE — Progress Notes (Signed)
Background: the patient is followed for coronary artery disease, aortic valve disease, and history of heart block status post permanent pacemaker placement. He has undergone coronary stenting in 2005 and 2009. He ultimately developed symptomatic severe aortic stenosis with progressive multivessel CAD and he underwent bioprosthetic aortic valve replacement and three-vessel CABG in 2012. His postoperative course was complicated by atrial fibrillation requiring amiodarone and warfarin. Atrial fibrillation was limited to the postoperative period and he has not required long-term antiarrhythmic therapy or anticoagulation. In 2014 he developed left arm pain and epigastric discomfort. A nuclear scan showed a fixed inferoposterior defect with peri-infarct ischemia and the patient underwent cardiac catheterization for further evaluation.  This demonstrated multivessel CAD with mild to moderate nonobstructive RCA stenosis, severe LAD and left circumflex stenoses, and continued patency of the LIMA to LAD graft as well as the sequential vein graftto the obtuse marginal branches.  HPI:  81 year old gentleman presenting for follow-up evaluation.he was last seen in 2014. He presents today because of progressive dyspnea with exertion. He has noted worsening fatigue and shortness of breath now over the past 6-12 months. Symptoms have slowly progressed. He has checked oxygen saturations at home and they have been in the range of 94%. They used to run about 98% on average. He has been evaluated by his pulmonologist who advised him that he felt his shortness of breath is related to heart disease.  The patient does carry a diagnosis of COPD and he smoked cigarettes for 30-40 years. He quit about 10 years ago. He has developed orthopnea without PND. He has some leg swelling. He denies any chest pain or pressure. He denies heart palpitations, lightheadedness, or syncope. He's had no fever or chills. He has no other complaints at  this time. He does feel that his fatigue is much worse since he started metoprolol succinate.  Studies:  Cardiac catheterization 03/22/2013: Procedural Findings: Hemodynamics:  AO 100/70 LV not recorded  Coronary angiography: Coronary dominance: right  Left mainstem: The left main is patent. There is no obstructive disease.  Left anterior descending (LAD): The ostial LAD has heavy calcification with a large filling defect and tight 90% stenosis. At the first septal perforator the LAD fills competitively from the mammary artery.  Left circumflex (LCx): The left circumflex is totally occluded. There is heavy calcification present. There is a small intermediate branch with 80% proximal stenosis   Right coronary artery (RCA): The RCA is dominant. The vessel has 50% proximal stenosis. There is diffuse irregularity throughout. The ostium and proximal vessel are heavily calcified. There is no pressure damping with the catheter engaged. The mid vessel has 30-40% stenosis. The PDA is patent.  LIMA to LAD: Widely patent with no obstructive disease. The LIMA is large in caliber. The mid LAD beyond the graft insertion site is widely patent. The first diagonal fills retrograde from the graft with no significant stenoses.  Saphenous vein graft sequential to OM 3 an OM 4: Widely patent throughout. There are no significant stenoses within the graft. The distal anastomotic sites are widely patent. Both OM branches are free of significant disease. They are medium in caliber. The AV circumflex fills retrograde.  Left ventriculography: Not assessed  Final Conclusions:  1. Severe native two-vessel coronary artery disease with severe stenosis of the proximal LAD and total occlusion left circumflex 2. Mild to moderate proximal RCA stenosis 3. Status post aortocoronary bypass surgery with continued patency of the LIMA to LAD and saphenous vein graft sequential to OM 3  and OM 4  Outpatient  Encounter Prescriptions as of 10/31/2014  Medication Sig  . aspirin 81 MG tablet Take 81 mg by mouth daily.  Marland Kitchen. atorvastatin (LIPITOR) 80 MG tablet Take 80 mg by mouth daily.   Marland Kitchen. escitalopram (LEXAPRO) 10 MG tablet Take 10 mg by mouth at bedtime. Take 1/2 to 1 tablet by mouth every night at bedtime as needed for sleep.  . metoprolol succinate (TOPROL-XL) 25 MG 24 hr tablet Take 25 mg by mouth at bedtime.   Marland Kitchen. zolpidem (AMBIEN) 10 MG tablet 10 mg 1/2-1 tablet at bedtime for sleep   . [DISCONTINUED] ALPRAZolam (XANAX) 0.25 MG tablet Take 0.25 mg by mouth daily.    . [DISCONTINUED] celecoxib (CELEBREX) 200 MG capsule Take 200 mg by mouth daily.    . [DISCONTINUED] fluorouracil (EFUDEX) 5 % cream   . [DISCONTINUED] furosemide (LASIX) 20 MG tablet Take 20 mg by mouth daily.   . [DISCONTINUED] LINZESS 145 MCG CAPS     No Known Allergies  Past Medical History  Diagnosis Date  . COPD (chronic obstructive pulmonary disease)   . Hyperlipidemia   . Morbid obesity     weight 243 pounds, BMI 31.2kg/m2, BSA 2.36 square meters  . Obstructive sleep apnea   . History of coronary artery disease     status post stenting of the marginal circumflex in 12/2003 and again in 2009  . Aortic stenosis     family history is not on file.   ROS: Negative except as per HPI  BP 142/80 mmHg  Pulse 73  Ht 6\' 2"  (1.88 m)  Wt 239 lb (108.41 kg)  BMI 30.67 kg/m2  SpO2 96%  PHYSICAL EXAM: Pt is alert and oriented, Pleasant elderly male in NAD HEENT: normal Neck: JVP - elevated with large V waves, carotids 2+= without bruits Lungs: CTA bilaterally with prolonged expiration CV: RRR with grade 2/6 early systolic ejection murmur at the right upper sternal border Abd: soft, NT, Positive BS, no hepatomegaly Ext: 1+ pretibial edema bilaterally, distal pulses intact and equal Skin: warm/dry no rash  EKG:  Electronic ventricular pacemaker 73 bpm  ASSESSMENT AND PLAN: 1. Coronary artery disease status post CABG,  without symptoms of angina 2. Aortic stenosis status post bioprosthetic aortic valve replacement 3. Complete heart block status post permanent pacemaker placement 4. Carotid stenosis without history of stroke, status post right carotid endarterectomy 5. Hyperlipidemia, treated with high-dose atorvastatin 6. Progressive shortness of breath. 7. Paroxysmal atrial fibrillation.  The patient's most significant problem at present is worsening shortness of breath. He does have the clinical exam evidence of volume excess with elevated jugular venous pressure and peripheral edema. I reviewed an outside echocardiogram from the early postoperative period in 2013. This demonstrated normal function of his aortic valve bioprosthesis, normal LVEF estimated at 65-70%, and no significant residual valvular disease. I think an echocardiogram should be repeated to evaluate for development of valvular disease, assessment of his bioprosthetic aortic valve, and reassessment of left ventricular systolic and diastolic function. I suspect he has developed at least some degree of diastolic heart failure. Will also check baseline lab work to include a CBC, metabolic panel, and BNP.  Depending on those findings, will likely add furosemide to his medical program. I will request his records from his most recent pulmonary visit. COPD may be playing a role in his symptoms. We will also interrogate his pacemaker to evaluate for atrial fibrillation or other dysrhythmia. Finally, may need to consider discontinuation of metoprolol succinate  since his fatigue has been much worse on this medication.  Addendum: The patient's pacemaker was interrogated today. This demonstrates atrial fibrillation 21% of the time. This may be significantly contributing to his shortness of breath. We discussed his risk of thromboembolism. CHADS-Vasc is at least 4 (age 83, vascular disease 1, HTN 1). We reviewed the pros and cons of chronic oral anticoagulation. In  the setting of valvular heart disease with an aortic valve bioprosthesis, I think warfarin is his only option. He will be started on warfarin 2.5 mg and followed in the Coumadin clinic. Will also refer him for formal electrophysiology consultation and to establish in our device clinic. I think antiarrhythmic drug therapy may be a reasonable consideration. Suspect his options will be either amiodarone or Tikosyn.  We will arrange follow-up after his studies have been completed.  Tonny Bollman, MD 10/31/2014 4:40 PM

## 2014-11-01 ENCOUNTER — Telehealth: Payer: Self-pay

## 2014-11-01 DIAGNOSIS — I2510 Atherosclerotic heart disease of native coronary artery without angina pectoris: Secondary | ICD-10-CM

## 2014-11-01 DIAGNOSIS — E782 Mixed hyperlipidemia: Secondary | ICD-10-CM

## 2014-11-01 LAB — CBC WITH DIFFERENTIAL/PLATELET
BASOS ABS: 0 10*3/uL (ref 0.0–0.1)
Basophils Relative: 0.5 % (ref 0.0–3.0)
EOS PCT: 2 % (ref 0.0–5.0)
Eosinophils Absolute: 0.1 10*3/uL (ref 0.0–0.7)
HEMATOCRIT: 41.3 % (ref 39.0–52.0)
Hemoglobin: 13.6 g/dL (ref 13.0–17.0)
Lymphocytes Relative: 17.8 % (ref 12.0–46.0)
Lymphs Abs: 1.3 10*3/uL (ref 0.7–4.0)
MCHC: 32.9 g/dL (ref 30.0–36.0)
MCV: 97.3 fl (ref 78.0–100.0)
MONO ABS: 0.5 10*3/uL (ref 0.1–1.0)
Monocytes Relative: 7.5 % (ref 3.0–12.0)
NEUTROS ABS: 5.1 10*3/uL (ref 1.4–7.7)
Neutrophils Relative %: 72.2 % (ref 43.0–77.0)
Platelets: 144 10*3/uL — ABNORMAL LOW (ref 150.0–400.0)
RBC: 4.24 Mil/uL (ref 4.22–5.81)
RDW: 14.3 % (ref 11.5–15.5)
WBC: 7.1 10*3/uL (ref 4.0–10.5)

## 2014-11-01 LAB — COMPREHENSIVE METABOLIC PANEL
ALT: 22 U/L (ref 0–53)
AST: 32 U/L (ref 0–37)
Albumin: 3.8 g/dL (ref 3.5–5.2)
Alkaline Phosphatase: 59 U/L (ref 39–117)
BILIRUBIN TOTAL: 0.8 mg/dL (ref 0.2–1.2)
BUN: 21 mg/dL (ref 6–23)
CALCIUM: 9.2 mg/dL (ref 8.4–10.5)
CHLORIDE: 105 meq/L (ref 96–112)
CO2: 29 meq/L (ref 19–32)
Creatinine, Ser: 0.8 mg/dL (ref 0.4–1.5)
GFR: 97 mL/min (ref >60.00)
Glucose, Bld: 79 mg/dL (ref 70–99)
Potassium: 4.4 mEq/L (ref 3.5–5.1)
SODIUM: 139 meq/L (ref 135–145)
Total Protein: 6.7 g/dL (ref 6.0–8.3)

## 2014-11-01 LAB — BRAIN NATRIURETIC PEPTIDE: PRO B NATRI PEPTIDE: 298 pg/mL — AB (ref 0.0–100.0)

## 2014-11-01 MED ORDER — FUROSEMIDE 20 MG PO TABS: ORAL_TABLET | ORAL | Status: DC

## 2014-11-01 MED ORDER — POTASSIUM CHLORIDE ER 10 MEQ PO TBCR: 10.0000 meq | EXTENDED_RELEASE_TABLET | Freq: Every day | ORAL | Status: DC

## 2014-11-01 NOTE — Progress Notes (Signed)
Arrhythmia check per Dr. Excell Seltzerooper. Pt in AT/AF 21.2% of time + ASA only. Pt being followed by [facility]. Battery voltage 2.85, ERI=2.81V. I advised pt to start monthly battery checks. Pt states [facility] loosely followed by [facility]. Threshold, sensing, impedances consistent with previous measurements. Device programmed to maximize longevity. Device programmed at appropriate safety margins. Histogram distribution appropriate for patient activity level. Device programmed to optimize intrinsic conduction. ROV w/ Dr[facility] Joh[facility]Allred [facility].

## 2014-11-01 NOTE — Telephone Encounter (Signed)
Discussed changes to medications and new lab order for Monday. Per Dr. Excell Seltzerooper, BNP elevated as suspected. Reviewed with Mrs Luetta NuttingReaves. Recommend start furosemide 40 mg x 3 days, then 20 mg daily thereafter. Would give KDur 10 meq daily and repeat BMET Monday when he comes in for his echocardiogram. Medication orders sent to pharmacy of patient's choice. Lab is ordered and scheduled.

## 2014-11-06 ENCOUNTER — Other Ambulatory Visit (INDEPENDENT_AMBULATORY_CARE_PROVIDER_SITE_OTHER): Payer: Medicare Other | Admitting: *Deleted

## 2014-11-06 ENCOUNTER — Ambulatory Visit (HOSPITAL_COMMUNITY): Payer: Medicare Other | Attending: Cardiology | Admitting: Radiology

## 2014-11-06 ENCOUNTER — Ambulatory Visit (INDEPENDENT_AMBULATORY_CARE_PROVIDER_SITE_OTHER): Payer: Medicare Other

## 2014-11-06 ENCOUNTER — Telehealth: Payer: Self-pay | Admitting: Cardiovascular Disease

## 2014-11-06 ENCOUNTER — Other Ambulatory Visit (HOSPITAL_COMMUNITY): Payer: Medicare Other

## 2014-11-06 DIAGNOSIS — R0602 Shortness of breath: Secondary | ICD-10-CM

## 2014-11-06 DIAGNOSIS — I350 Nonrheumatic aortic (valve) stenosis: Secondary | ICD-10-CM

## 2014-11-06 DIAGNOSIS — R0600 Dyspnea, unspecified: Secondary | ICD-10-CM | POA: Diagnosis not present

## 2014-11-06 DIAGNOSIS — I4891 Unspecified atrial fibrillation: Secondary | ICD-10-CM

## 2014-11-06 DIAGNOSIS — I2510 Atherosclerotic heart disease of native coronary artery without angina pectoris: Secondary | ICD-10-CM

## 2014-11-06 LAB — BASIC METABOLIC PANEL
BUN: 23 mg/dL (ref 6–23)
CALCIUM: 8.9 mg/dL (ref 8.4–10.5)
CO2: 29 meq/L (ref 19–32)
Chloride: 102 mEq/L (ref 96–112)
Creatinine, Ser: 0.8 mg/dL (ref 0.4–1.5)
GFR: 93.01 mL/min (ref >60.00)
Glucose, Bld: 75 mg/dL (ref 70–99)
POTASSIUM: 4.2 meq/L (ref 3.5–5.1)
Sodium: 138 mEq/L (ref 135–145)

## 2014-11-06 LAB — POCT INR: INR: 1.7

## 2014-11-06 NOTE — Patient Instructions (Signed)

## 2014-11-06 NOTE — Telephone Encounter (Signed)
ROI [facility] to [facility] Blaylock,PA-C at [phone]

## 2014-11-06 NOTE — Progress Notes (Signed)
Echocardiogram performed.  

## 2014-11-07 ENCOUNTER — Encounter: Payer: Self-pay | Admitting: *Deleted

## 2014-11-07 ENCOUNTER — Telehealth: Payer: Self-pay | Admitting: Cardiovascular Disease

## 2014-11-07 NOTE — Telephone Encounter (Signed)
Records rec From [facility] gave to Leotis ShamesLauren

## 2014-11-09 ENCOUNTER — Encounter: Payer: Self-pay | Admitting: *Deleted

## 2014-11-09 ENCOUNTER — Ambulatory Visit (INDEPENDENT_AMBULATORY_CARE_PROVIDER_SITE_OTHER): Payer: Medicare Other | Admitting: Internal Medicine

## 2014-11-09 ENCOUNTER — Encounter: Payer: Self-pay | Admitting: Internal Medicine

## 2014-11-09 VITALS — BP 110/60 | HR 72 | Ht 74.0 in | Wt 223.1 lb

## 2014-11-09 DIAGNOSIS — I4819 Other persistent atrial fibrillation: Secondary | ICD-10-CM

## 2014-11-09 DIAGNOSIS — I2510 Atherosclerotic heart disease of native coronary artery without angina pectoris: Secondary | ICD-10-CM

## 2014-11-09 DIAGNOSIS — I481 Persistent atrial fibrillation: Secondary | ICD-10-CM

## 2014-11-09 DIAGNOSIS — R0602 Shortness of breath: Secondary | ICD-10-CM

## 2014-11-09 DIAGNOSIS — I4891 Unspecified atrial fibrillation: Secondary | ICD-10-CM

## 2014-11-09 LAB — CBC WITH DIFFERENTIAL/PLATELET
BASOS ABS: 0.1 10*3/uL (ref 0.0–0.1)
BASOS PCT: 0.9 % (ref 0.0–3.0)
Eosinophils Absolute: 0.1 10*3/uL (ref 0.0–0.7)
Eosinophils Relative: 2 % (ref 0.0–5.0)
HEMATOCRIT: 42.3 % (ref 39.0–52.0)
Hemoglobin: 13.8 g/dL (ref 13.0–17.0)
LYMPHS ABS: 1.2 10*3/uL (ref 0.7–4.0)
LYMPHS PCT: 16.6 % (ref 12.0–46.0)
MCHC: 32.5 g/dL (ref 30.0–36.0)
MCV: 97.2 fl (ref 78.0–100.0)
MONO ABS: 0.7 10*3/uL (ref 0.1–1.0)
MONOS PCT: 9 % (ref 3.0–12.0)
Neutro Abs: 5.2 10*3/uL (ref 1.4–7.7)
Neutrophils Relative %: 71.5 % (ref 43.0–77.0)
Platelets: 154 10*3/uL (ref 150.0–400.0)
RBC: 4.36 Mil/uL (ref 4.22–5.81)
RDW: 14.2 % (ref 11.5–15.5)
WBC: 7.3 10*3/uL (ref 4.0–10.5)

## 2014-11-09 LAB — MDC_IDC_ENUM_SESS_TYPE_INCLINIC
Battery Voltage: 2.85 V
Brady Statistic AS VP Percent: 86.36 %
Brady Statistic AS VS Percent: 13.07 %
Brady Statistic RV Percent Paced: 86.9 %
Date Time Interrogation Session: 20160114102322
Lead Channel Impedance Value: 1312 Ohm
Lead Channel Sensing Intrinsic Amplitude: 0.5196
Lead Channel Sensing Intrinsic Amplitude: 6.4813
Lead Channel Setting Pacing Amplitude: 5 V
Lead Channel Setting Pacing Pulse Width: 1 ms
MDC IDC MSMT LEADCHNL RA IMPEDANCE VALUE: 376 Ohm
MDC IDC MSMT LEADCHNL RV PACING THRESHOLD AMPLITUDE: 2.5 V
MDC IDC MSMT LEADCHNL RV PACING THRESHOLD PULSEWIDTH: 1 ms
MDC IDC SET LEADCHNL RA PACING AMPLITUDE: 2.5 V
MDC IDC SET LEADCHNL RV SENSING SENSITIVITY: 0.9 mV
MDC IDC SET ZONE DETECTION INTERVAL: 430 ms
MDC IDC STAT BRADY AP VP PERCENT: 0.54 %
MDC IDC STAT BRADY AP VS PERCENT: 0.03 %
MDC IDC STAT BRADY RA PERCENT PACED: 0.57 %
Zone Setting Detection Interval: 350 ms

## 2014-11-09 LAB — BASIC METABOLIC PANEL
BUN: 19 mg/dL (ref 6–23)
CHLORIDE: 104 meq/L (ref 96–112)
CO2: 33 meq/L — AB (ref 19–32)
CREATININE: 0.96 mg/dL (ref 0.40–1.50)
Calcium: 9.1 mg/dL (ref 8.4–10.5)
GFR: 79.73 mL/min (ref >60.00)
Glucose, Bld: 81 mg/dL (ref 70–99)
Potassium: 4.9 mEq/L (ref 3.5–5.1)
Sodium: 140 mEq/L (ref 135–145)

## 2014-11-09 MED ORDER — ATORVASTATIN CALCIUM 80 MG PO TABS: 80.0000 mg | ORAL_TABLET | Freq: Every day | ORAL | Status: DC

## 2014-11-09 NOTE — Patient Instructions (Addendum)
Your physician recommends that you schedule a follow-up appointment in: 4 weeks with Rudi Cocoonna Carroll, NP and 12 months with Dr Johney FrameAllred  Remote monitoring is used to monitor your Pacemaker of ICD from home. This monitoring reduces the number of office visits required to check your device to one time per year. It allows us to keep an eye on the functioning of your device to ensure it is working properly. You are scheduled for a device check from home on 12/11/14. You may send your transmission at any time that day. If you have a wireless device, the transmission will be sent automatically. After your physician reviews your transmission, you will receive a postcard with your next transmission date.  Your physician recommends that you return for lab work today       Your physician has requested that you have a TEE/Cardioversion. During a TEE, sound waves are used to create images of your heart. It provides your doctor with information about the size and shape of your heart and how well your heart's chambers and valves are working. In this test, a transducer is attached to the end of a flexible tube that is guided down you throat and into your esophagus (the tube leading from your mouth to your stomach) to get a more detailed image of your heart. Once the TEE has determined that a blood clot is not present, the cardioversion begins. Electrical Cardioversion uses a jolt of electricity to your heart either through paddles or wired patches attached to your chest. This is a controlled, usually prescheduled, procedure. This procedure is done at the hospital and you are not awake during the procedure. You usually go home the day of the procedure. Please see the instruction sheet given to you today for more information.   Electrical Cardioversion Electrical cardioversion is the delivery of a jolt of electricity to change the rhythm of the heart. Sticky patches or metal paddles are placed on the chest to deliver the  electricity from a device. This is done to restore a normal rhythm. A rhythm that is too fast or not regular keeps the heart from pumping well. Electrical cardioversion is done in an emergency if:   There is low or no blood pressure as a result of the heart rhythm.   Normal rhythm must be restored as fast as possible to protect the brain and heart from further damage.   It may save a life. Cardioversion may be done for heart rhythms that are not immediately life threatening, such as atrial fibrillation or flutter, in which:   The heart is beating too fast or is not regular.   Medicine to change the rhythm has not worked.   It is safe to wait in order to allow time for preparation.  Symptoms of the abnormal rhythm are bothersome.  The risk of stroke and other serious problems can be reduced. LET [facility] PROVIDER KNOW ABOUT:   Any allergies you have.  All medicines you are taking, including vitamins, herbs, eye drops, creams, and over-the-counter medicines.  Previous problems you or members of your family have had with the use of anesthetics.   Any blood disorders you have.   Previous surgeries you have had.   Medical conditions you have. RISKS AND COMPLICATIONS  Generally, this is a safe procedure. However, problems can occur and include:   Breathing problems related to the anesthetic used.  A blood clot that breaks free and travels to other parts of your body. This could  cause a stroke or other problems. The risk of this is lowered by use of blood-thinning medicine (anticoagulant) prior to the procedure.  Cardiac arrest (rare). BEFORE THE PROCEDURE   You may have tests to detect blood clots in your heart and to evaluate heart function.  You may start taking anticoagulants so your blood does not clot as easily.   Medicines may be given to help stabilize your heart rate and rhythm. PROCEDURE  You will be given medicine through an IV tube to reduce  discomfort and make you sleepy (sedative).   An electrical shock will be delivered. AFTER THE PROCEDURE Your heart rhythm will be watched to make sure it does not change.  Document Released: 10/03/2002 Document Revised: 02/27/2014 Document Reviewed: 04/27/2013 [facility] Patient Information 2015 [place], [place]. This information is not intended to replace advice given to you by your health care provider. Make sure you discuss any questions you have with your health care provider.

## 2014-11-09 NOTE — Progress Notes (Signed)
Electrophysiology Office Note   Date:  11/09/2014   ID:  ATHA MCBAIN, DOB 07/26/1931, MRN [id number]  PCP:  [name], MD  Cardiologist:  Dr Excell Seltzer  Chief Complaint  Patient presents with  . Atrial Fibrillation     History of Present Illness: AHMARION SARACENO is a 81 y.o. male who presents today for EP consultation evaluation.   He recently transferred general cardiology care to Dr Excell Seltzer from [facility].  He has persistent atrial fibrillation.  He also has a PPM which was impmlanted 09/08/2005 for syncope and AV block.  He has done well since he had his pacemaker implanted.  He has not had any further syncope.  He has coronary artery disease, aortic valve disease, and history of heart block status post permanent pacemaker placement. He has undergone coronary stenting in 2005 and 2009. He ultimately developed symptomatic severe aortic stenosis with progressive multivessel CAD and he underwent bioprosthetic aortic valve replacement and three-vessel CABG in 2012. His postoperative course was complicated by atrial fibrillation requiring amiodarone and warfarin. Atrial fibrillation was limited to the postoperative period and he has not required long-term antiarrhythmic therapy or anticoagulation. In 2014 he developed left arm pain and epigastric discomfort. A nuclear scan showed a fixed inferoposterior defect with peri-infarct ischemia and the patient underwent cardiac catheterization for further evaluation. This demonstrated multivessel CAD with mild to moderate nonobstructive RCA stenosis, severe LAD and left circumflex stenoses, and continued patency of the LIMA to LAD graft as well as the sequential vein graftto the obtuse marginal branches.  He recently (November) developed atrial fibrillation.  He presents today because of progressive dyspnea with exertion. He has noted worsening fatigue and shortness of breath now over the past 6-12 months. Symptoms have slowly progressed. He has checked  oxygen saturations at home and they have been in the range of 94%.   He has been evaluated by his pulmonologist who advised him that he felt his shortness of breath is related to heart disease. The patient does carry a diagnosis of COPD and he smoked cigarettes for 30-40 years. He quit about 10 years ago. He has developed orthopnea without PND. He has some leg swelling.   He has noticed some chest "fluttering".  He was started on lasix and has lost 12 lbs. Today, he denies symptoms of  chest pain,  ower extremity edema, claudication, dizziness, presyncope, syncope, bleeding, or neurologic sequela. The patient is tolerating medications without difficulties and is otherwise without complaint today.   Past Medical History  Diagnosis Date  . COPD (chronic obstructive pulmonary disease)   . Hyperlipidemia   . Morbid obesity     weight 243 pounds, BMI 31.2kg/m2, BSA 2.36 square meters  . Obstructive sleep apnea     compliant with CPAP  . History of coronary artery disease     status post stenting of the marginal circumflex in 12/2003 and again in 2009  . Aortic stenosis     s/p AVR by Dr Laneta Simmers    Current Outpatient Prescriptions  Medication Sig Dispense Refill  . aspirin 81 MG tablet Take 81 mg by mouth daily.    Marland Kitchen atorvastatin (LIPITOR) 80 MG tablet Take 1 tablet (80 mg total) by mouth daily. 90 tablet 3  . escitalopram (LEXAPRO) 10 MG tablet Take 10 mg by mouth at bedtime. Take 1/2 to 1 tablet by mouth every night at bedtime as needed for sleep.  5  . furosemide (LASIX) 20 MG tablet Take 20 mg by  mouth daily.    . metoprolol succinate (TOPROL-XL) 25 MG 24 hr tablet Take 25 mg by mouth at bedtime.   6  . potassium chloride (K-DUR) 10 MEQ tablet Take 1 tablet (10 mEq total) by mouth daily. 90 tablet 3  . warfarin (COUMADIN) 2.5 MG tablet Take 1 tablet (2.5 mg total) by mouth daily. 90 tablet 3  . zolpidem (AMBIEN) 10 MG tablet 10 mg 1/2-1 tablet at bedtime for sleep      No current  facility-administered medications for this visit.    Allergies:   Review of patient's allergies indicates no known allergies.   Social History:  The patient  reports that he has quit smoking. He does not have any smokeless tobacco history on file. He reports that he drinks alcohol. He reports that he does not use illicit drugs.   Family History:  The patient is unaware of any pertinent FH.  Denies HTN or DM in family   ROS:  Please see the history of present illness.   Positive for fatigue, unsteadiness, and bruising,   All other systems are reviewed and negative.    PHYSICAL EXAM: VS:  BP 110/60 mmHg  Pulse 72  Ht  (1.88 m)  Wt 223 lb 2 oz (101.209 kg)  BMI 28.64 kg/m2 , BMI Body mass index is 28.64 kg/(m^2). GEN: Well nourished, well developed, in no acute distress HEENT: normal Neck: no JVD, carotid bruits, or masses Cardiac: iRRR; no murmurs, rubs, or gallops,no edema  Respiratory:  clear to auscultation bilaterally, normal work of breathing GI: soft, nontender, nondistended, + BS MS: no deformity or atrophy Skin: warm and dry, no rash, pacemaker pocket is well healed Neuro:  Strength and sensation are intact Psych: euthymic mood, full affect  EKG:  EKG is ordered today. The ekg ordered today shows atrial fibrillation, V paced at 72 bpm  Recent Labs: 10/31/2014: ALT 22; Hemoglobin 13.6; Platelets 144.0*; Pro B Natriuretic peptide (BNP) 298.0* 11/06/2014: BUN 23; Creatinine 0.8; Potassium 4.2; Sodium 138  No results found for requested labs within last 365 days.     Estimated Creatinine Clearance: 92 mL/min (by C-G formula based on Cr of 0.8).   Wt Readings from Last 3 Encounters:  11/09/14 223 lb 2 oz (101.209 kg)  10/31/14 239 lb (108.41 kg)  03/22/13 234 lb (106.142 kg)     Other studies Reviewed:  Additional studies/ records reviewed today include: echo 11/06/14 EF 55%, LA 45 mm, mild MR, Cath reveals stable CAD Dr Randolm Idol note is also reviewed   Device  interrogation is reviewed and reveals battery approaching ERI, RV pacing threshold is high, he has been in perstent AF and pacing since November (previously did not V pace very much)  ASSESSMENT AND PLAN:  1.  Persistent atrial fibrillation The patient has symptomatic persistent atrial fibrillation.  I think that he would do better in sinus rhythm.   I worry that he is at very high risk for decompensation/ rehospitalization. Today, we discussed options of tikosyn and amiodarone.  Risks, benefits, and alternatives to each were discussed today.  He would prefer tikosyn.  Given symptoms, I do not think that he should wait 4 weeks for cardioversion.  We therefore discussed TEE guided cardioversion with admit for tikosyn.  He will continue coumadin.  chads2vasc score is at least 3  2. AV block He did not V pace but 20% until recently converting to afib I have decreased pacing rate from 70 to 60 bpm today. RV  pacing threshold is chronically elevated He has an enrhythm that is approaching ERI We will follow remotely  3. CAD Stable No change required today    Current medicines are reviewed at length with the patient today.  The patient is without any concerns regarding medicines and no changes are made today.    Disposition:   FU with Rudi Cocoonna Carroll NP in the AF clinic in 4 weeks after cardioversion   Randolm IdolSigned, Timya Trimmer MD  11/09/2014 10:01 AM      [facility] [address] [place] [address]  [phone] (office) [phone] (fax)

## 2014-11-13 ENCOUNTER — Ambulatory Visit (INDEPENDENT_AMBULATORY_CARE_PROVIDER_SITE_OTHER): Payer: Medicare Other | Admitting: Pharmacist

## 2014-11-13 DIAGNOSIS — I481 Persistent atrial fibrillation: Secondary | ICD-10-CM

## 2014-11-13 DIAGNOSIS — I4819 Other persistent atrial fibrillation: Secondary | ICD-10-CM

## 2014-11-13 DIAGNOSIS — R0602 Shortness of breath: Secondary | ICD-10-CM

## 2014-11-13 DIAGNOSIS — I350 Nonrheumatic aortic (valve) stenosis: Secondary | ICD-10-CM

## 2014-11-13 DIAGNOSIS — I4891 Unspecified atrial fibrillation: Secondary | ICD-10-CM

## 2014-11-13 LAB — POCT INR: INR: 1.9

## 2014-11-14 ENCOUNTER — Encounter (HOSPITAL_COMMUNITY): Payer: Self-pay | Admitting: *Deleted

## 2014-11-14 ENCOUNTER — Encounter (HOSPITAL_COMMUNITY)
Admission: RE | Disposition: A | Discharge status: Home / Self Care | Payer: Self-pay | Source: Ambulatory Visit | Attending: Cardiology

## 2014-11-14 ENCOUNTER — Ambulatory Visit (HOSPITAL_COMMUNITY): Payer: Medicare Other | Admitting: Anesthesiology

## 2014-11-14 ENCOUNTER — Ambulatory Visit (HOSPITAL_COMMUNITY)
Admission: RE | Admit: 2014-11-14 | Discharge: 2014-11-14 | Disposition: A | Discharge status: Home / Self Care | Payer: Medicare Other | Source: Ambulatory Visit | Attending: Cardiology | Admitting: Cardiology

## 2014-11-14 DIAGNOSIS — Z7982 Long term (current) use of aspirin: Secondary | ICD-10-CM | POA: Diagnosis not present

## 2014-11-14 DIAGNOSIS — G4733 Obstructive sleep apnea (adult) (pediatric): Secondary | ICD-10-CM | POA: Insufficient documentation

## 2014-11-14 DIAGNOSIS — J449 Chronic obstructive pulmonary disease, unspecified: Secondary | ICD-10-CM | POA: Diagnosis not present

## 2014-11-14 DIAGNOSIS — Z951 Presence of aortocoronary bypass graft: Secondary | ICD-10-CM | POA: Diagnosis not present

## 2014-11-14 DIAGNOSIS — Z87891 Personal history of nicotine dependence: Secondary | ICD-10-CM | POA: Diagnosis not present

## 2014-11-14 DIAGNOSIS — I4430 Unspecified atrioventricular block: Secondary | ICD-10-CM | POA: Diagnosis not present

## 2014-11-14 DIAGNOSIS — I2510 Atherosclerotic heart disease of native coronary artery without angina pectoris: Secondary | ICD-10-CM | POA: Diagnosis present

## 2014-11-14 DIAGNOSIS — Z6828 Body mass index (BMI) 28.0-28.9, adult: Secondary | ICD-10-CM | POA: Insufficient documentation

## 2014-11-14 DIAGNOSIS — E785 Hyperlipidemia, unspecified: Secondary | ICD-10-CM | POA: Diagnosis not present

## 2014-11-14 DIAGNOSIS — I350 Nonrheumatic aortic (valve) stenosis: Secondary | ICD-10-CM | POA: Insufficient documentation

## 2014-11-14 DIAGNOSIS — Z7901 Long term (current) use of anticoagulants: Secondary | ICD-10-CM | POA: Diagnosis not present

## 2014-11-14 DIAGNOSIS — Z952 Presence of prosthetic heart valve: Secondary | ICD-10-CM | POA: Diagnosis not present

## 2014-11-14 DIAGNOSIS — I4891 Unspecified atrial fibrillation: Secondary | ICD-10-CM

## 2014-11-14 DIAGNOSIS — I481 Persistent atrial fibrillation: Secondary | ICD-10-CM | POA: Insufficient documentation

## 2014-11-14 DIAGNOSIS — Z950 Presence of cardiac pacemaker: Secondary | ICD-10-CM | POA: Insufficient documentation

## 2014-11-14 DIAGNOSIS — Z955 Presence of coronary angioplasty implant and graft: Secondary | ICD-10-CM | POA: Insufficient documentation

## 2014-11-14 DIAGNOSIS — I4819 Other persistent atrial fibrillation: Secondary | ICD-10-CM | POA: Diagnosis present

## 2014-11-14 DIAGNOSIS — I480 Paroxysmal atrial fibrillation: Secondary | ICD-10-CM | POA: Insufficient documentation

## 2014-11-14 HISTORY — PX: CARDIOVERSION: SHX1299

## 2014-11-14 HISTORY — PX: TEE WITHOUT CARDIOVERSION: SHX5443

## 2014-11-14 SURGERY — ECHOCARDIOGRAM, TRANSESOPHAGEAL
Anesthesia: Monitor Anesthesia Care

## 2014-11-14 MED ORDER — SODIUM CHLORIDE 0.9 % IV SOLN
INTRAVENOUS | Status: DC
  Administered 2014-11-14: 500 mL via INTRAVENOUS

## 2014-11-14 MED ORDER — PROPOFOL INFUSION 10 MG/ML OPTIME
INTRAVENOUS | Status: DC | PRN
  Administered 2014-11-14: 120 ug/kg/min via INTRAVENOUS

## 2014-11-14 NOTE — Discharge Instructions (Signed)
Electrical Cardioversion, Care After °Refer to this sheet in the next few weeks. These instructions provide you with information on caring for yourself after your procedure. Your health care provider may also give you more specific instructions. Your treatment has been planned according to current medical practices, but problems sometimes occur. Call your health care provider if you have any problems or questions after your procedure. °WHAT TO EXPECT AFTER THE PROCEDURE °After your procedure, it is typical to have the following sensations: °· Some redness on the skin where the shocks were delivered. If this is tender, a sunburn lotion or hydrocortisone cream may help. °· Possible return of an abnormal heart rhythm within hours or days after the procedure. °HOME CARE INSTRUCTIONS °· Take medicines only as directed by your health care provider. Be sure you understand how and when to take your medicine. °· Learn how to feel your pulse and check it often. °· Limit your activity for 48 hours after the procedure or as directed by your health care provider. °· Avoid or minimize caffeine and other stimulants as directed by your health care provider. °SEEK MEDICAL CARE IF: °· You feel like your heart is beating too fast or your pulse is not regular. °· You have any questions about your medicines. °· You have bleeding that will not stop. °SEEK IMMEDIATE MEDICAL CARE IF: °· You are dizzy or feel faint. °· It is hard to breathe or you feel short of breath. °· There is a change in discomfort in your chest. °· Your speech is slurred or you have trouble moving an arm or leg on one side of your body. °· You get a serious muscle cramp that does not go away. °· Your fingers or toes turn cold or blue. °Document Released: 08/03/2013 Document Revised: 02/27/2014 Document Reviewed: 08/03/2013 °[employer]® Patient Information ©2015 [employer]. This information is not intended to replace advice given to you by your health care provider.  Make sure you discuss any questions you have with your health care provider. °Transesophageal Echocardiogram °Transesophageal echocardiography (TEE) is a special type of test that produces images of the heart by using sound waves (echocardiogram). This type of echocardiography can obtain better images of the heart than standard echocardiography. TEE is done by passing a flexible tube down the esophagus. The heart is located in front of the esophagus. Because the heart and esophagus are close to one another, your health care provider can take very clear, detailed pictures of the heart via ultrasound waves. °TEE may be done: °· If your health care provider needs more information based on standard echocardiography findings. °· If you had a stroke. This might have happened because a clot formed in your heart. TEE can visualize different areas of the heart and check for clots. °· To check valve anatomy and function. °· To check for infection on the inside of your heart (endocarditis). °· To evaluate the dividing wall (septum) of the heart and presence of a hole that did not close after birth (patent foramen ovale or atrial septal defect). °· To help diagnose a tear in the wall of the aorta (aortic dissection). °· During cardiac valve surgery. This allows the surgeon to assess the valve repair before closing the chest. °· During a variety of other cardiac procedures to guide positioning of catheters. °· Sometimes before a cardioversion, which is a shock to convert heart rhythm back to normal. °LET YOUR HEALTH CARE PROVIDER KNOW ABOUT:  °· Any allergies you have. °· All medicines you   are taking, including vitamins, herbs, eye drops, creams, and over-the-counter medicines. °· Previous problems you or members of your family have had with the use of anesthetics. °· Any blood disorders you have. °· Previous surgeries you have had. °· Medical conditions you have. °· Swallowing difficulties. °· An esophageal obstruction. °RISKS  AND COMPLICATIONS  °Generally, TEE is a safe procedure. However, as with any procedure, complications can occur. Possible complications include an esophageal tear (rupture). °BEFORE THE PROCEDURE  °· Do not eat or drink for 6 hours before the procedure or as directed by your health care provider. °· Arrange for someone to drive you home after the procedure. Do not drive yourself home. During the procedure, you will be given medicines that can continue to make you feel drowsy and can impair your reflexes. °· An IV access tube will be started in the arm. °PROCEDURE  °· A medicine to help you relax (sedative) will be given through the IV access tube. °· A medicine may be sprayed or gargled to numb the back of the throat. °· Your blood pressure, heart rate, and breathing (vital signs) will be monitored during the procedure. °· The TEE probe is a long, flexible tube. The tip of the probe is placed into the back of the mouth, and you will be asked to swallow. This helps to pass the tip of the probe into the esophagus. Once the tip of the probe is in the correct area, your health care provider can take pictures of the heart. °· TEE is usually not a painful procedure. You may feel the probe press against the back of the throat. The probe does not enter the trachea and does not affect your breathing. °AFTER THE PROCEDURE  °· You will be in bed, resting, until you have fully returned to consciousness. °· When you first awaken, your throat may feel slightly sore and will probably still feel numb. This will improve slowly over time. °· You will not be allowed to eat or drink until it is clear that the numbness has improved. °· Once you have been able to drink, urinate, and sit on the edge of the bed without feeling sick to your stomach (nausea) or dizzy, you may be cleared to go home. °· You should have a friend or family member with you for the next 24 hours after your procedure. °Document Released: 01/03/2003 Document  Revised: 10/18/2013 Document Reviewed: 04/14/2013 °[employer]® Patient Information ©2015 [employer]. This information is not intended to replace advice given to you by your health care provider. Make sure you discuss any questions you have with your health care provider. ° °

## 2014-11-14 NOTE — H&P (View-Only) (Signed)
 Electrophysiology Office Note   Date:  11/09/2014   ID:  Alex [name], DOB 02/27/1931, MRN [id number]  PCP:  [name],Alex W, MD  Cardiologist:  Dr Cooper  Chief Complaint  Patient presents with  . Atrial Fibrillation     History of Present Illness: Alex [name] is a 81 y.o. male who presents today for EP consultation evaluation.   He recently transferred general cardiology care to Dr Cooper from [place].  He has persistent atrial fibrillation.  He also has a PPM which was impmlanted 09/08/2005 for syncope and AV block.  He has done well since he had his pacemaker implanted.  He has not had any further syncope.  He has coronary artery disease, aortic valve disease, and history of heart block status post permanent pacemaker placement. He has undergone coronary stenting in 2005 and 2009. He ultimately developed symptomatic severe aortic stenosis with progressive multivessel CAD and he underwent bioprosthetic aortic valve replacement and three-vessel CABG in 2012. His postoperative course was complicated by atrial fibrillation requiring amiodarone and warfarin. Atrial fibrillation was limited to the postoperative period and he has not required long-term antiarrhythmic therapy or anticoagulation. In 2014 he developed left arm pain and epigastric discomfort. A nuclear scan showed a fixed inferoposterior defect with peri-infarct ischemia and the patient underwent cardiac catheterization for further evaluation. This demonstrated multivessel CAD with mild to moderate nonobstructive RCA stenosis, severe LAD and left circumflex stenoses, and continued patency of the LIMA to LAD graft as well as the sequential vein graftto the obtuse marginal branches.  He recently (November) developed atrial fibrillation.  He presents today because of progressive dyspnea with exertion. He has noted worsening fatigue and shortness of breath now over the past 6-12 months. Symptoms have slowly progressed. He has checked  oxygen saturations at home and they have been in the range of 94%.   He has been evaluated by his pulmonologist who advised him that he felt his shortness of breath is related to heart disease. The patient does carry a diagnosis of COPD and he smoked cigarettes for 30-40 years. He quit about 10 years ago. He has developed orthopnea without PND. He has some leg swelling.   He has noticed some chest "fluttering".  He was started on lasix and has lost 12 lbs. Today, he denies symptoms of  chest pain,  ower extremity edema, claudication, dizziness, presyncope, syncope, bleeding, or neurologic sequela. The patient is tolerating medications without difficulties and is otherwise without complaint today.   Past Medical History  Diagnosis Date  . COPD (chronic obstructive pulmonary disease)   . Hyperlipidemia   . Morbid obesity     weight 243 pounds, BMI 31.2kg/m2, BSA 2.36 square meters  . Obstructive sleep apnea     compliant with CPAP  . History of coronary artery disease     status post stenting of the marginal circumflex in 12/2003 and again in 2009  . Aortic stenosis     s/p AVR by Dr Bartle    Current Outpatient Prescriptions  Medication Sig Dispense Refill  . aspirin 81 MG tablet Take 81 mg by mouth daily.    . atorvastatin (LIPITOR) 80 MG tablet Take 1 tablet (80 mg total) by mouth daily. 90 tablet 3  . escitalopram (LEXAPRO) 10 MG tablet Take 10 mg by mouth at bedtime. Take 1/2 to 1 tablet by mouth every night at bedtime as needed for sleep.  5  . furosemide (LASIX) 20 MG tablet Take 20 mg by   mouth daily.    . metoprolol succinate (TOPROL-XL) 25 MG 24 hr tablet Take 25 mg by mouth at bedtime.   6  . potassium chloride (K-DUR) 10 MEQ tablet Take 1 tablet (10 mEq total) by mouth daily. 90 tablet 3  . warfarin (COUMADIN) 2.5 MG tablet Take 1 tablet (2.5 mg total) by mouth daily. 90 tablet 3  . zolpidem (AMBIEN) 10 MG tablet 10 mg 1/2-1 tablet at bedtime for sleep      No current  facility-administered medications for this visit.    Allergies:   Review of patient's allergies indicates no known allergies.   Social History:  The patient  reports that he has quit smoking. He does not have any smokeless tobacco history on file. He reports that he drinks alcohol. He reports that he does not use illicit drugs.   Family History:  The patient is unaware of any pertinent FH.  Denies HTN or DM in family   ROS:  Please see the history of present illness.   Positive for fatigue, unsteadiness, and bruising,   All other systems are reviewed and negative.    PHYSICAL EXAM: VS:  BP 110/60 mmHg  Pulse 72  Ht 6' 2" (1.88 m)  Wt 223 lb 2 oz (101.209 kg)  BMI 28.64 kg/m2 , BMI Body mass index is 28.64 kg/(m^2). GEN: Well nourished, well developed, in no acute distress HEENT: normal Neck: no JVD, carotid bruits, or masses Cardiac: iRRR; no murmurs, rubs, or gallops,no edema  Respiratory:  clear to auscultation bilaterally, normal work of breathing GI: soft, nontender, nondistended, + BS MS: no deformity or atrophy Skin: warm and dry, no rash, pacemaker pocket is well healed Neuro:  Strength and sensation are intact Psych: euthymic mood, full affect  EKG:  EKG is ordered today. The ekg ordered today shows atrial fibrillation, V paced at 72 bpm  Recent Labs: 10/31/2014: ALT 22; Hemoglobin 13.6; Platelets 144.0*; Pro B Natriuretic peptide (BNP) 298.0* 11/06/2014: BUN 23; Creatinine 0.8; Potassium 4.2; Sodium 138  No results found for requested labs within last 365 days.     Estimated Creatinine Clearance: 92 mL/min (by C-G formula based on Cr of 0.8).   Wt Readings from Last 3 Encounters:  11/09/14 223 lb 2 oz (101.209 kg)  10/31/14 239 lb (108.41 kg)  03/22/13 234 lb (106.142 kg)     Other studies Reviewed:  Additional studies/ records reviewed today include: echo 11/06/14 EF 55%, LA 45 mm, mild MR, Cath reveals stable CAD Dr Coopers note is also reviewed   Device  interrogation is reviewed and reveals battery approaching ERI, RV pacing threshold is high, he has been in perstent AF and pacing since November (previously did not V pace very much)  ASSESSMENT AND PLAN:  1.  Persistent atrial fibrillation The patient has symptomatic persistent atrial fibrillation.  I think that he would do better in sinus rhythm.   I worry that he is at very high risk for decompensation/ rehospitalization. Today, we discussed options of tikosyn and amiodarone.  Risks, benefits, and alternatives to each were discussed today.  He would prefer tikosyn.  Given symptoms, I do not think that he should wait 4 weeks for cardioversion.  We therefore discussed TEE guided cardioversion with admit for tikosyn.  He will continue coumadin.  chads2vasc score is at least 3  2. AV block He did not V pace but 20% until recently converting to afib I have decreased pacing rate from 70 to 60 bpm today. RV   pacing threshold is chronically elevated He has an enrhythm that is approaching ERI We will follow remotely  3. CAD Stable No change required today    Current medicines are reviewed at length with the patient today.  The patient is without any concerns regarding medicines and no changes are made today.    Disposition:   FU with Donna Carroll NP in the AF clinic in 4 weeks after cardioversion   Signed, Minda Faas MD  11/09/2014 10:01 AM      [facility] [address] [place] [address]  [phone] (office) [phone] (fax)  

## 2014-11-14 NOTE — Progress Notes (Signed)
Echocardiogram Echocardiogram Transesophageal has been performed.  Dorothey BasemanReel, Kavion Mancinas M 11/14/2014, 2:44 PM

## 2014-11-14 NOTE — Anesthesia Preprocedure Evaluation (Addendum)
Anesthesia Evaluation  Patient identified by MRN, date of birth, ID band Patient awake    Reviewed: Allergy & Precautions, NPO status , Patient's Chart, lab work & pertinent test results  Airway Mallampati: II   Neck ROM: full    Dental  (+) Edentulous Upper   Pulmonary shortness of breath, sleep apnea , COPDformer smoker,          Cardiovascular + CAD + dysrhythmias Atrial Fibrillation     Neuro/Psych    GI/Hepatic   Endo/Other    Renal/GU      Musculoskeletal   Abdominal   Peds  Hematology   Anesthesia Other Findings   Reproductive/Obstetrics                           Anesthesia Physical Anesthesia Plan  ASA: III  Anesthesia Plan: MAC   Post-op Pain Management:    Induction: Intravenous  Airway Management Planned: Simple Face Mask  Additional Equipment:   Intra-op Plan:   Post-operative Plan:   Informed Consent: I have reviewed the patients History and Physical, chart, labs and discussed the procedure including the risks, benefits and alternatives for the proposed anesthesia with the patient or authorized representative who has indicated his/her understanding and acceptance.     Plan Discussed with: CRNA, Anesthesiologist and Surgeon  Anesthesia Plan Comments:         Anesthesia Quick Evaluation

## 2014-11-14 NOTE — CV Procedure (Signed)
    Electrical Cardioversion Procedure Note Alex DuhamelJohn R [name] [id number] July 01, 1931  Procedure: Electrical Cardioversion Indications:  Atrial Fibrillation  Time Out: Verified patient identification, verified procedure,medications/allergies/relevent history reviewed, required imaging and test results available.  Performed  Procedure Details  The patient was NPO after midnight. Anesthesia was administered at the beside  by Dr. Dan EuropeHodier with propofol.  Cardioversion was performed with synchronized biphasic defibrillation via AP pads with 150 joules.  1 attempt(s) were performed.  The patient converted to normal sinus rhythm. The patient tolerated the procedure well   IMPRESSION:   Successful cardioversion of atrial fibrillation Pacer functioning well. Demonstrates NSR.    SKAINS, MARK 11/14/2014, 2:41 PM

## 2014-11-14 NOTE — Transfer of Care (Signed)
Immediate Anesthesia Transfer of Care Note  Patient: Alex DuhamelJohn R [name]  Procedure(s) Performed: Procedure(s): TRANSESOPHAGEAL ECHOCARDIOGRAM (TEE) (N/A) CARDIOVERSION (N/A)  Patient Location: Endoscopy Unit  Anesthesia Type:MAC  Level of Consciousness: awake, alert  and oriented  Airway & Oxygen Therapy: Patient Spontanous Breathing and Patient connected to nasal cannula oxygen  Post-op Assessment: Report given to PACU RN, Post -op Vital signs reviewed and stable and Patient moving all extremities  Post vital signs: Reviewed and stable  Complications: No apparent anesthesia complications

## 2014-11-14 NOTE — Anesthesia Procedure Notes (Signed)
Procedure Name: MAC Date/Time: 11/14/2014 2:24 PM Performed by: Quentin OreWALKER, Ranjit Ashurst E Pre-anesthesia Checklist: Patient identified, Emergency Drugs available, Suction available, Patient being monitored and Timeout performed Patient Re-evaluated:Patient Re-evaluated prior to inductionOxygen Delivery Method: Nasal cannula Intubation Type: IV induction Placement Confirmation: positive ETCO2

## 2014-11-14 NOTE — Interval H&P Note (Signed)
History and Physical Interval Note:  11/14/2014 2:15 PM  Alex DuhamelJohn R [name]  has presented today for surgery, with the diagnosis of afib  The various methods of treatment have been discussed with the patient and family. After consideration of risks, benefits and other options for treatment, the patient has consented to  Procedure(s): TRANSESOPHAGEAL ECHOCARDIOGRAM (TEE) (N/A) CARDIOVERSION (N/A) as a surgical intervention .  The patient's history has been reviewed, patient examined, no change in status, stable for surgery.  I have reviewed the patient's chart and labs.  Questions were answered to the patient's satisfaction.     Devon Kingdon

## 2014-11-14 NOTE — Anesthesia Postprocedure Evaluation (Signed)
  Anesthesia Post-op Note  Patient: Alex DuhamelJohn R [name]  Procedure(s) Performed: Procedure(s): TRANSESOPHAGEAL ECHOCARDIOGRAM (TEE) (N/A) CARDIOVERSION (N/A)  Patient Location: Endoscopy Unit  Anesthesia Type:MAC  Level of Consciousness: awake, alert  and oriented  Airway and Oxygen Therapy: Patient Spontanous Breathing and Patient connected to nasal cannula oxygen  Post-op Pain: none  Post-op Assessment: Post-op Vital signs reviewed, Patient's Cardiovascular Status Stable, Respiratory Function Stable, Patent Airway and No signs of Nausea or vomiting  Post-op Vital Signs: Reviewed and stable  Last Vitals:  Filed Vitals:   11/14/14 1448  BP: 89/39  Pulse: 66  Temp: 36.5 C    Complications: No apparent anesthesia complications

## 2014-11-15 ENCOUNTER — Encounter (HOSPITAL_COMMUNITY): Payer: Self-pay | Admitting: Cardiology

## 2014-11-20 ENCOUNTER — Encounter: Payer: Self-pay | Admitting: Internal Medicine

## 2014-11-20 ENCOUNTER — Ambulatory Visit (INDEPENDENT_AMBULATORY_CARE_PROVIDER_SITE_OTHER): Payer: Medicare Other

## 2014-11-20 DIAGNOSIS — I4891 Unspecified atrial fibrillation: Secondary | ICD-10-CM

## 2014-11-20 DIAGNOSIS — R0602 Shortness of breath: Secondary | ICD-10-CM

## 2014-11-20 DIAGNOSIS — I350 Nonrheumatic aortic (valve) stenosis: Secondary | ICD-10-CM

## 2014-11-20 DIAGNOSIS — I481 Persistent atrial fibrillation: Secondary | ICD-10-CM

## 2014-11-20 DIAGNOSIS — I4819 Other persistent atrial fibrillation: Secondary | ICD-10-CM

## 2014-11-20 LAB — POCT INR: INR: 2.1

## 2014-11-27 ENCOUNTER — Ambulatory Visit (INDEPENDENT_AMBULATORY_CARE_PROVIDER_SITE_OTHER): Payer: Medicare Other | Admitting: *Deleted

## 2014-11-27 DIAGNOSIS — R0602 Shortness of breath: Secondary | ICD-10-CM

## 2014-11-27 DIAGNOSIS — I4819 Other persistent atrial fibrillation: Secondary | ICD-10-CM

## 2014-11-27 DIAGNOSIS — I4891 Unspecified atrial fibrillation: Secondary | ICD-10-CM

## 2014-11-27 DIAGNOSIS — I481 Persistent atrial fibrillation: Secondary | ICD-10-CM

## 2014-11-27 DIAGNOSIS — I350 Nonrheumatic aortic (valve) stenosis: Secondary | ICD-10-CM

## 2014-11-27 LAB — POCT INR: INR: 2.1

## 2014-12-11 ENCOUNTER — Ambulatory Visit (INDEPENDENT_AMBULATORY_CARE_PROVIDER_SITE_OTHER): Payer: Medicare Other | Admitting: Pharmacist

## 2014-12-11 ENCOUNTER — Ambulatory Visit (INDEPENDENT_AMBULATORY_CARE_PROVIDER_SITE_OTHER): Payer: Medicare Other | Admitting: *Deleted

## 2014-12-11 ENCOUNTER — Ambulatory Visit (INDEPENDENT_AMBULATORY_CARE_PROVIDER_SITE_OTHER): Payer: Medicare Other | Admitting: Nurse Practitioner

## 2014-12-11 ENCOUNTER — Encounter: Payer: Self-pay | Admitting: Nurse Practitioner

## 2014-12-11 ENCOUNTER — Encounter: Payer: Self-pay | Admitting: Internal Medicine

## 2014-12-11 VITALS — BP 130/68 | HR 69 | Ht 73.5 in | Wt 226.0 lb

## 2014-12-11 DIAGNOSIS — R0602 Shortness of breath: Secondary | ICD-10-CM

## 2014-12-11 DIAGNOSIS — I480 Paroxysmal atrial fibrillation: Secondary | ICD-10-CM

## 2014-12-11 DIAGNOSIS — I481 Persistent atrial fibrillation: Secondary | ICD-10-CM

## 2014-12-11 DIAGNOSIS — I350 Nonrheumatic aortic (valve) stenosis: Secondary | ICD-10-CM

## 2014-12-11 DIAGNOSIS — Z45010 Encounter for checking and testing of cardiac pacemaker pulse generator [battery]: Secondary | ICD-10-CM

## 2014-12-11 DIAGNOSIS — I4891 Unspecified atrial fibrillation: Secondary | ICD-10-CM

## 2014-12-11 DIAGNOSIS — I4819 Other persistent atrial fibrillation: Secondary | ICD-10-CM

## 2014-12-11 LAB — POCT INR: INR: 1.8

## 2014-12-11 LAB — MDC_IDC_ENUM_SESS_TYPE_INCLINIC
Battery Voltage: 2.88 V
Brady Statistic AP VP Percent: 0.66 %
Brady Statistic AP VS Percent: 27.19 %
Brady Statistic AS VS Percent: 71.24 %
Brady Statistic RA Percent Paced: 27.85 %
Brady Statistic RV Percent Paced: 1.57 %
Lead Channel Impedance Value: 1328 Ohm
Lead Channel Impedance Value: 352 Ohm
Lead Channel Sensing Intrinsic Amplitude: 4.7757
Lead Channel Setting Pacing Amplitude: 2.5 V
Lead Channel Setting Pacing Amplitude: 5 V
Lead Channel Setting Pacing Pulse Width: 1 ms
MDC IDC MSMT LEADCHNL RA SENSING INTR AMPL: 0.6927
MDC IDC SESS DTM: 20160215120602
MDC IDC SET LEADCHNL RV SENSING SENSITIVITY: 0.9 mV
MDC IDC STAT BRADY AS VP PERCENT: 0.91 %
Zone Setting Detection Interval: 350 ms
Zone Setting Detection Interval: 430 ms

## 2014-12-11 NOTE — Patient Instructions (Addendum)
Remote monitoring is used to monitor your Pacemaker or ICD from home. This monitoring reduces the number of office visits required to check your device to one time per year. It allows us to keep an eye on the functioning of your device to ensure it is working properly. You are scheduled for a device check from home on 01/10/2015. You may send your transmission at any time that day. If you have a wireless device, the transmission will be sent automatically. After your physician reviews your transmission, you will receive a postcard with your next transmission date.  Your physician recommends that you schedule a follow-up appointment in: 3 months with Rudi Cocoonna Carroll, NP.

## 2014-12-11 NOTE — Progress Notes (Signed)
Date:  12/11/2014   ID:  Alex [name], DOB [birth year], MRN [id number]  PCP:  [name], MD  Cardiologist:  Dr Excell Seltzer  Chief Complaint  Patient presents with  . Atrial Fibrillation     History of Present Illness: Alex [name] is a 81 y.o. male who presents today for EP consultation evaluation.   He recently transferred general cardiology care to Dr Excell Seltzer from [facility].  He has persistent atrial fibrillation.  He also has a PPM which was impmlanted 09/08/2005 for syncope and AV block.  He has done well since he had his pacemaker implanted.  He has not had any further syncope.  He has coronary artery disease, aortic valve disease, and history of heart block status post permanent pacemaker placement. He has undergone coronary stenting in 2005 and 2009. He ultimately developed symptomatic severe aortic stenosis with progressive multivessel CAD and he underwent bioprosthetic aortic valve replacement and three-vessel CABG in 2012. His postoperative course was complicated by atrial fibrillation requiring amiodarone and warfarin. Atrial fibrillation was limited to the postoperative period and he has not required long-term antiarrhythmic therapy or anticoagulation. In 2014 he developed left arm pain and epigastric discomfort. A nuclear scan showed a fixed inferoposterior defect with peri-infarct ischemia and the patient underwent cardiac catheterization for further evaluation. This demonstrated multivessel CAD with mild to moderate nonobstructive RCA stenosis, severe LAD and left circumflex stenoses, and continued patency of the LIMA to LAD graft as well as the sequential vein graftto the obtuse marginal branches.  He recently (November) developed atrial fibrillation.  He presents today because of progressive dyspnea with exertion. He has noted worsening fatigue and shortness of breath now over the past 6-12 months. Symptoms have slowly progressed. He has checked oxygen saturations at home  and they have been in the range of 94%.   He has been evaluated by his pulmonologist who advised him that he felt his shortness of breath is related to heart disease. The patient does carry a diagnosis of COPD and he smoked cigarettes for 30-40 years. He quit about 10 years ago. He has developed orthopnea without PND. He has some leg swelling.   He has noticed some chest "fluttering".  He was started on lasix and has lost 12 lbs.  Per Dr. Jenel Lucks last note, he was suppose to be hospitalized for tikosyn load and DCCV if necessary. For unknown reasons, pt did not receive tikosyn load  but did undergo successful DCCV. Today, he reports he feels much better and interrogation reveals Afib burden less that 0.1%. Interrogation also reveals battery life close to ERI,  he will need monthly remote checks for best timing of gen change out.  Today, he denies symptoms of  chest pain, lower extremity edema, claudication, dizziness, presyncope, syncope, bleeding, or neurologic sequela. The patient is tolerating medications without difficulties and is otherwise without complaint today.   Past Medical History  Diagnosis Date  . COPD (chronic obstructive pulmonary disease)   . Hyperlipidemia   . Morbid obesity     weight 243 pounds, BMI 31.2kg/m2, BSA 2.36 square meters  . Obstructive sleep apnea     compliant with CPAP  . History of coronary artery disease     status post stenting of the marginal circumflex in 12/2003 and again in 2009  . Aortic stenosis     s/p AVR by Dr Laneta Simmers    Current Outpatient Prescriptions  Medication Sig Dispense Refill  . aspirin 81 MG  tablet Take 81 mg by mouth daily.    Marland Kitchen atorvastatin (LIPITOR) 80 MG tablet Take 1 tablet (80 mg total) by mouth daily. 90 tablet 3  . escitalopram (LEXAPRO) 10 MG tablet Take 10 mg by mouth at bedtime. Take 1/2 to 1 tablet by mouth every night at bedtime as needed for sleep.  5  . furosemide (LASIX) 20 MG tablet Take 20 mg by mouth daily.    .  metoprolol succinate (TOPROL-XL) 25 MG 24 hr tablet Take 25 mg by mouth at bedtime.   6  . potassium chloride (K-DUR) 10 MEQ tablet Take 1 tablet (10 mEq total) by mouth daily. 90 tablet 3  . warfarin (COUMADIN) 2.5 MG tablet Take as directed by the coumadin clinic    . zolpidem (AMBIEN) 10 MG tablet 10 mg 1/2-1 tablet at bedtime for sleep      No current facility-administered medications for this visit.    Allergies:   Review of patient's allergies indicates no known allergies.   Social History:  The patient  reports that he has quit smoking. He does not have any smokeless tobacco history on file. He reports that he drinks alcohol. He reports that he does not use illicit drugs.   Family History:  The patient is unaware of any pertinent FH.  Denies HTN or DM in family   ROS:  Please see the history of present illness.   Positive for fatigue, unsteadiness, and bruising,   All other systems are reviewed and negative.    PHYSICAL EXAM: VS:  BP 130/68 mmHg  Pulse 69  Ht 6' 1.5" (1.867 m)  Wt 226 lb (102.513 kg)  BMI 29.41 kg/m2 , BMI Body mass index is 29.41 kg/(m^2). GEN: Well nourished, well developed, in no acute distress HEENT: normal Neck: no JVD, carotid bruits, or masses Cardiac: RRR; no murmurs, rubs, or gallops,no edema  Respiratory:  clear to auscultation bilaterally, normal work of breathing GI: soft, nontender, nondistended, + BS MS: no deformity or atrophy Skin: warm and dry, no rash, pacemaker pocket is well healed Neuro:  Strength and sensation are intact Psych: euthymic mood, full affect  EKG:  EKG is ordered today. The ekg ordered today shows, electronic atrial pacemaker. See pacer interrogation, SR today with low afib burden less than 0.1%. Battery approaching ERI. Monthly remote checks to follow.     Wt Readings from Last 3 Encounters:  12/11/14 226 lb (102.513 kg)  11/14/14 223 lb (101.152 kg)  11/09/14 223 lb 2 oz (101.209 kg)      ASSESSMENT AND  PLAN:  1.  Persistent atrial fibrillation Currently in SR with successful DCCV 11/14/14, pt feels well. Unclear reasons why pt did not undergo tikosyn loading. Will f/u in 3 months, if symptomatic afib burden increases, pt was advised to call office and will arrange tikosyn.  2. AV block He did not V pace but 20% until recently converting to afib I have decreased pacing rate from 70 to 60 bpm today. RV pacing threshold is chronically elevated He has an enrhythm that is approaching ERI We will follow with monthly remote checks  3. CAD Stable No change required today      Current medicines are reviewed at length with the patient today.  The patient is without any concerns regarding medicines and no changes are made today.    Disposition:   FU with Rudi Coco NP in the AF clinic in 4 weeks after cardioversion   Signed, Hillis Range MD  12/11/2014  1:30 PM      [facility] [address] [place] [address]  [phone] (office) [phone] (fax)

## 2014-12-11 NOTE — Progress Notes (Signed)
Rhythm & battery check post DCCV. 1 AT/AF---<0.1% + warfarin. No EGM for episode. Battery @2 .88V, ERI=2.81V. [facility] for battery only 01/10/15. Next billable April.

## 2014-12-26 ENCOUNTER — Ambulatory Visit (INDEPENDENT_AMBULATORY_CARE_PROVIDER_SITE_OTHER): Payer: Medicare Other | Admitting: *Deleted

## 2014-12-26 DIAGNOSIS — I350 Nonrheumatic aortic (valve) stenosis: Secondary | ICD-10-CM

## 2014-12-26 DIAGNOSIS — I4891 Unspecified atrial fibrillation: Secondary | ICD-10-CM

## 2014-12-26 DIAGNOSIS — I481 Persistent atrial fibrillation: Secondary | ICD-10-CM

## 2014-12-26 DIAGNOSIS — R0602 Shortness of breath: Secondary | ICD-10-CM

## 2014-12-26 DIAGNOSIS — I4819 Other persistent atrial fibrillation: Secondary | ICD-10-CM

## 2014-12-26 LAB — POCT INR: INR: 1.9

## 2015-01-04 ENCOUNTER — Encounter: Payer: Self-pay | Admitting: Cardiovascular Disease

## 2015-01-04 ENCOUNTER — Ambulatory Visit (INDEPENDENT_AMBULATORY_CARE_PROVIDER_SITE_OTHER): Payer: Medicare Other | Admitting: *Deleted

## 2015-01-04 ENCOUNTER — Ambulatory Visit (INDEPENDENT_AMBULATORY_CARE_PROVIDER_SITE_OTHER): Payer: Medicare Other | Admitting: Cardiovascular Disease

## 2015-01-04 VITALS — BP 102/58 | HR 63 | Ht 73.5 in | Wt 222.0 lb

## 2015-01-04 DIAGNOSIS — I4891 Unspecified atrial fibrillation: Secondary | ICD-10-CM

## 2015-01-04 DIAGNOSIS — I4819 Other persistent atrial fibrillation: Secondary | ICD-10-CM

## 2015-01-04 DIAGNOSIS — R0602 Shortness of breath: Secondary | ICD-10-CM

## 2015-01-04 DIAGNOSIS — I481 Persistent atrial fibrillation: Secondary | ICD-10-CM

## 2015-01-04 DIAGNOSIS — I350 Nonrheumatic aortic (valve) stenosis: Secondary | ICD-10-CM

## 2015-01-04 LAB — BASIC METABOLIC PANEL
BUN: 22 mg/dL (ref 6–23)
CALCIUM: 9.1 mg/dL (ref 8.4–10.5)
CHLORIDE: 103 meq/L (ref 96–112)
CO2: 34 meq/L — AB (ref 19–32)
Creatinine, Ser: 0.85 mg/dL (ref 0.40–1.50)
GFR: 91.71 mL/min (ref >60.00)
Glucose, Bld: 69 mg/dL — ABNORMAL LOW (ref 70–99)
POTASSIUM: 4.5 meq/L (ref 3.5–5.1)
Sodium: 139 mEq/L (ref 135–145)

## 2015-01-04 LAB — POCT INR: INR: 3

## 2015-01-04 LAB — BRAIN NATRIURETIC PEPTIDE: PRO B NATRI PEPTIDE: 205 pg/mL — AB (ref 0.0–100.0)

## 2015-01-04 NOTE — Progress Notes (Signed)
Cardiology Office Note   Date:  01/04/2015   ID:  Alex DuhamelJohn R [name], DOB 1931-04-18, MRN [id number]  PCP:  Alex [name],Alex [name]  Cardiologist:  Alex BollmanMichael Cassidee Deats, [name]    Chief Complaint  Patient presents with  . Aortic Stenosis     History of Present Illness: Alex [name] is a 81 y.o. male who presents for follow-up of diastolic heart failure, atrial fibrillation, aortic valve disease, and CAD. He has undergone permanent pacemaker placement and has recently established with Alex [name] for ongoing device follow-up.   He has undergone coronary stenting in 2005 and 2009. He ultimately developed symptomatic severe aortic stenosis with progressive multivessel CAD and he underwent bioprosthetic aortic valve replacement and three-vessel CABG in 2012. His postoperative course was complicated by atrial fibrillation requiring amiodarone and warfarin. Atrial fibrillation was limited to the postoperative period and he has not required long-term antiarrhythmic therapy or anticoagulation. In 2014 he developed left arm pain and epigastric discomfort. A nuclear scan showed a fixed inferoposterior defect with peri-infarct ischemia and the patient underwent cardiac catheterization for further evaluation. This demonstrated multivessel CAD with mild to moderate nonobstructive RCA stenosis, severe LAD and left circumflex stenoses, and continued patency of the LIMA to LAD graft as well as the sequential vein graftto the obtuse marginal branches.  The patient was seen back in follow-up in January 2016. He had symptomatic heart failure and was started on furosemide. He was also noted to be in atrial fibrillation. Warfarin was started for anticoagulation. Plans were made for Tikosyn initiation/cardioversion. For some reason, he was cardioverted and discharged home. Tikosyn was not initiated. At the time of his last device check, he was maintaining sinus rhythm without recurrence of atrial fibrillation. His device [facility] at [facility] and  he continues to have close follow-up in the device clinic.  The patient is feeling well. He's lost about 20 pounds since starting furosemide. Edema has resolved. His breathing has improved. He still has some shortness of breath with activity, but this is markedly improved. He has no chest pain, chest pressure, lightheadedness, or syncope.   Past Medical History  Diagnosis Date  . COPD (chronic obstructive pulmonary disease)   . Hyperlipidemia   . Morbid obesity     weight 243 pounds, BMI 31.2kg/m2, BSA 2.36 square meters  . Obstructive sleep apnea     compliant with CPAP  . History of coronary artery disease     status post stenting of the marginal circumflex in 12/2003 and again in 2009  . Aortic stenosis     s/p AVR by Alex [name]    Past Surgical History  Procedure Laterality Date  . Appendectomy    . Replacement total knee bilateral      2006  . Carotid endarterectomy      Alex [name]  . Permanent pacemaker      MDT EnRhythm implanted by Alex [name] for complete heart block with syncope  . Aortic valve replacement (avr)/coronary artery bypass grafting (cabg)   08/05/2011    LIMA to LAD, sequential saphenous vein graft to third and fourth obtuse marginal branches of the circumflex, aortic valve replacement using a 23 mm Edwards pericardial valve  . Tee without cardioversion N/A 11/14/2014    Procedure: TRANSESOPHAGEAL ECHOCARDIOGRAM (TEE);  Surgeon: Alex SchultzMark Skains, [name];  [facility];  Service: Cardiovascular;  Laterality: N/A;  . Cardioversion N/A 11/14/2014    Procedure: CARDIOVERSION;  Surgeon: Alex SchultzMark Skains, [name];  [name];  Service:  Cardiovascular;  Laterality: N/A;    Current Outpatient Prescriptions  Medication Sig Dispense Refill  . aspirin 81 MG tablet Take 81 mg by mouth daily.    Marland Kitchen atorvastatin (LIPITOR) 80 MG tablet Take 1 tablet (80 mg total) by mouth daily. 90 tablet 3  . escitalopram (LEXAPRO) 10 MG tablet Take 10 mg by mouth at  bedtime. Take 1/2 to 1 tablet by mouth every night at bedtime as needed for sleep.  5  . furosemide (LASIX) 20 MG tablet Take 20 mg by mouth daily.    . metoprolol succinate (TOPROL-XL) 25 MG 24 hr tablet Take 25 mg by mouth at bedtime.   6  . potassium chloride (K-DUR) 10 MEQ tablet Take 1 tablet (10 mEq total) by mouth daily. 90 tablet 3  . warfarin (COUMADIN) 2.5 MG tablet Take as directed by the coumadin clinic    . zolpidem (AMBIEN) 10 MG tablet 10 mg 1/2-1 tablet at bedtime for sleep      No current facility-administered medications for this visit.    Allergies:   Review of patient's allergies indicates no known allergies.   Social History:  The patient  reports that he has quit smoking. He does not have any smokeless tobacco history on file. He reports that he drinks alcohol. He reports that he does not use illicit drugs.   Family History:  The patient's  family history is not on file.   ROS:  Please see the history of present illness.  Otherwise, review of systems is positive for shortness of breath with exertion, easy bruising, balance problems.  All other systems are reviewed and negative.    PHYSICAL EXAM: VS:  BP 102/58 mmHg  Pulse 63  Ht 6' 1.5" (1.867 m)  Wt 222 lb (100.699 kg)  BMI 28.89 kg/m2  SpO2 97% , BMI Body mass index is 28.89 kg/(m^2). GEN: Well nourished, well developed, in no acute distress HEENT: normal Neck: no JVD, no masses. No carotid bruits Cardiac: RRR with soft systolic ejection murmur at the right upper sternal border               Respiratory:  clear to auscultation bilaterally, normal work of breathing GI: soft, nontender, nondistended, + BS MS: no deformity or atrophy Ext: no pretibial edema, pedal pulses 2+= bilaterally Skin: warm and dry, no rash Neuro:  Strength and sensation are intact Psych: euthymic mood, full affect  EKG:  EKG is not ordered today.   Recent Labs: 10/31/2014: ALT 22; Pro B Natriuretic peptide (BNP) 298.0* 11/09/2014:  BUN 19; Creatinine 0.96; Hemoglobin 13.8; Platelets 154.0; Potassium 4.9; Sodium 140   Lipid Panel  No results found for: CHOL, TRIG, HDL, CHOLHDL, VLDL, LDLCALC, LDLDIRECT    Wt Readings from Last 3 Encounters:  01/04/15 222 lb (100.699 kg)  12/11/14 226 lb (102.513 kg)  11/14/14 223 lb (101.152 kg)     Cardiac Studies Reviewed: TEE: Study Conclusions  - Left ventricle: The cavity size was normal. Wall thickness was normal. Systolic function was normal. The estimated ejection fraction was in the range of 55% to 60%. No evidence of thrombus. - Aortic valve: A bioprosthesis was present and functioning normally. No evidence of vegetation. There was trivial regurgitation. - Mitral valve: No evidence of vegetation. There was mild regurgitation. - Left atrium: No evidence of thrombus in the atrial cavity or appendage. No evidence of thrombus in the appendage. - Right atrium: No evidence of thrombus in the atrial cavity or appendage. - Atrial septum:  No defect or patent foramen ovale was identified. - Tricuspid valve: No evidence of vegetation. - Pulmonic valve: No evidence of vegetation.  Impressions:  - Successful cardioversion. No cardiac source of emboli was indentified.  ASSESSMENT AND PLAN: 1.  Chronic diastolic heart failure, New York Heart Association functional class II: The patient is doing much better on daily furosemide. His volume status is improved on exam. We'll continue his same medical program which was reviewed today. Blood pressure is very well controlled. We'll check a metabolic panel and BNP. I will see him back in 6 months unless problems arise.  2. Paroxysmal atrial fibrillation. He is tolerating warfarin. Appears to be maintaining sinus rhythm. He's at a fairly high risk of recurrence considering age, presence of diastolic heart failure, and aortic valve disease. If he has significant AF burden or becomes more symptomatic, he likely will  require antiarrhythmic therapy.  3. Aortic valve disease status post bioprosthetic aortic valve replacement: His valve is functioning normally by echo which was reviewed.  4. Essential hypertension: Blood pressure is well controlled on a combination of metoprolol succinate and furosemide.  5. Chronic anticoagulation: Tolerating without bleeding problems. He is on warfarin because of the presence of valvular heart disease.   Current medicines are reviewed with the patient today.  The patient does not have concerns regarding medicines.  The following changes have been made:  no change  Labs/ tests ordered today include:   Orders Placed This Encounter  Procedures  . Basic Metabolic Panel (BMET)  . B Nat Peptide    Disposition:   FU 6 months  Signed, Alex Bollman, [name]  01/04/2015 1:30 PM    [facility] [address] [place], [place], [place]  [address] Phone: [phone]; Fax: [phone]

## 2015-01-04 NOTE — Patient Instructions (Signed)
Your physician recommends that you return for lab work: BMP and BNP  Your physician wants you to follow-up in: 6 MONTHS with Dr Excell Seltzerooper.  You will receive a reminder letter in the mail two months in advance. If you don't receive a letter, please call our office to schedule the follow-up appointment.  Your physician recommends that you continue on your current medications as directed. Please refer to the Current Medication list given to you today.

## 2015-01-10 ENCOUNTER — Ambulatory Visit (INDEPENDENT_AMBULATORY_CARE_PROVIDER_SITE_OTHER): Payer: Medicare Other | Admitting: *Deleted

## 2015-01-10 DIAGNOSIS — Z95810 Presence of automatic (implantable) cardiac defibrillator: Secondary | ICD-10-CM | POA: Diagnosis not present

## 2015-01-10 DIAGNOSIS — I481 Persistent atrial fibrillation: Secondary | ICD-10-CM

## 2015-01-10 DIAGNOSIS — Z45010 Encounter for checking and testing of cardiac pacemaker pulse generator [battery]: Secondary | ICD-10-CM

## 2015-01-10 NOTE — Progress Notes (Signed)
Remote pacemaker transmission.   

## 2015-01-11 ENCOUNTER — Telehealth: Payer: Self-pay | Admitting: *Deleted

## 2015-01-11 LAB — MDC_IDC_ENUM_SESS_TYPE_REMOTE
Brady Statistic AP VP Percent: 0.23 %
Brady Statistic AS VS Percent: 51.89 %
Brady Statistic RV Percent Paced: 0.26 %
Lead Channel Impedance Value: 1328 Ohm
Lead Channel Impedance Value: 352 Ohm
Lead Channel Setting Pacing Amplitude: 2.5 V
Lead Channel Setting Pacing Pulse Width: 1 ms
Lead Channel Setting Sensing Sensitivity: 0.9 mV
MDC IDC MSMT BATTERY VOLTAGE: 2.88 V
MDC IDC MSMT LEADCHNL RA SENSING INTR AMPL: 0.736
MDC IDC MSMT LEADCHNL RV SENSING INTR AMPL: 4.0934
MDC IDC SESS DTM: 20160316132012
MDC IDC SET LEADCHNL RV PACING AMPLITUDE: 5 V
MDC IDC SET ZONE DETECTION INTERVAL: 350 ms
MDC IDC STAT BRADY AP VS PERCENT: 47.85 %
MDC IDC STAT BRADY AS VP PERCENT: 0.02 %
MDC IDC STAT BRADY RA PERCENT PACED: 48.09 %
Zone Setting Detection Interval: 430 ms

## 2015-01-11 NOTE — Telephone Encounter (Signed)
Called and talked to pt's wife regarding starting sotalol to help keep him in rhythm.  Made an appointment for Tuesday of next week.

## 2015-01-16 ENCOUNTER — Ambulatory Visit (INDEPENDENT_AMBULATORY_CARE_PROVIDER_SITE_OTHER): Payer: Medicare Other | Admitting: Nurse Practitioner

## 2015-01-16 ENCOUNTER — Encounter: Payer: Self-pay | Admitting: *Deleted

## 2015-01-16 ENCOUNTER — Encounter: Payer: Self-pay | Admitting: Nurse Practitioner

## 2015-01-16 VITALS — BP 130/72 | HR 84 | Ht 74.0 in | Wt 226.2 lb

## 2015-01-16 DIAGNOSIS — I4819 Other persistent atrial fibrillation: Secondary | ICD-10-CM

## 2015-01-16 DIAGNOSIS — I481 Persistent atrial fibrillation: Secondary | ICD-10-CM

## 2015-01-16 MED ORDER — SOTALOL HCL 80 MG PO TABS: 80.0000 mg | ORAL_TABLET | Freq: Two times a day (BID) | ORAL | Status: DC

## 2015-01-16 NOTE — Progress Notes (Signed)
Date:  01/16/2015   ID:  Alex [name], DOB 31-Mar-1931, MRN [id number]  PCP:  [name], MD  Cardiologist:  Dr Excell Seltzer  Chief Complaint  Patient presents with  . Atrial Fibrillation     History of Present Illness: Alex [name] is a 81 y.o. male who presents today for EP consultation evaluation.   He recently transferred general cardiology care to Dr Excell Seltzer from [facility].  He has persistent atrial fibrillation.  He also has a PPM which was impmlanted 09/08/2005 for syncope and AV block.  He has done well since he had his pacemaker implanted.  He has not had any further syncope.  He has coronary artery disease, aortic valve disease, and history of heart block status post permanent pacemaker placement. He has undergone coronary stenting in 2005 and 2009. He ultimately developed symptomatic severe aortic stenosis with progressive multivessel CAD and he underwent bioprosthetic aortic valve replacement and three-vessel CABG in 2012. His postoperative course was complicated by atrial fibrillation requiring amiodarone and warfarin. Atrial fibrillation was limited to the postoperative period and he has not required long-term antiarrhythmic therapy or anticoagulation. In 2014 he developed left arm pain and epigastric discomfort. A nuclear scan showed a fixed inferoposterior defect with peri-infarct ischemia and the patient underwent cardiac catheterization for further evaluation. This demonstrated multivessel CAD with mild to moderate nonobstructive RCA stenosis, severe LAD and left circumflex stenoses, and continued patency of the LIMA to LAD graft as well as the sequential vein graftto the obtuse marginal branches.  He recently (November) developed atrial fibrillation.  He presented because of progressive dyspnea with exertion. Symptoms have slowly progressed. He has checked oxygen saturations at home and they have been in the range of 94%.   He has been evaluated by his pulmonologist who  advised him that he felt his shortness of breath is related to heart disease. The patient does carry a diagnosis of COPD and he smoked cigarettes for 30-40 years. He quit about 10 years ago. He has developed orthopnea without PND. He has some leg swelling.   He has noticed some chest "fluttering".  He was started on lasix and has lost 12 lbs.  Per Dr. Jenel Lucks last note, he was suppose to be hospitalized for tikosyn load and DCCV if necessary. For unknown reasons, pt did not receive tikosyn load  but did undergo successful DCCV. Today, he reports he feels much better and interrogation reveals Afib burden less that 0.1%. Interrogation also reveals battery life close to ERI,  he will need monthly remote checks for best timing of gen change out. His most recent remote interrogation still shows afib burden less than 0.1%. However, there is concern if pt should return to afib with RVR, there is potential for significant decompensation. After discussion with Dr. Johney Frame, he would like pt to be loaded on sotalol on an outpt basis, if in SR when started, which EKG confirms today. He will have close monitoring with EKG's to confirm tolerance of drug with QT interval.  Today, he denies symptoms of  chest pain, lower extremity edema, claudication, dizziness, presyncope, syncope, bleeding, or neurologic sequela. The patient is tolerating medications without difficulties and is otherwise without complaint today.   Past Medical History  Diagnosis Date  . COPD (chronic obstructive pulmonary disease)   . Hyperlipidemia   . Morbid obesity     weight 243 pounds, BMI 31.2kg/m2, BSA 2.36 square meters  . Obstructive sleep apnea  compliant with CPAP  . History of coronary artery disease     status post stenting of the marginal circumflex in 12/2003 and again in 2009  . Aortic stenosis     s/p AVR by Dr Laneta SimmersBartle    Current Outpatient Prescriptions  Medication Sig Dispense Refill  . aspirin 81 MG tablet Take 81 mg by  mouth daily.    Marland Kitchen. atorvastatin (LIPITOR) 80 MG tablet Take 1 tablet (80 mg total) by mouth daily. 90 tablet 3  . escitalopram (LEXAPRO) 10 MG tablet Take 10 mg by mouth at bedtime. Take 1/2 to 1 tablet by mouth every night at bedtime as needed for sleep.  5  . furosemide (LASIX) 20 MG tablet Take 20 mg by mouth daily.    . metoprolol succinate (TOPROL-XL) 25 MG 24 hr tablet Take 25 mg by mouth at bedtime.   6  . potassium chloride (K-DUR) 10 MEQ tablet Take 1 tablet (10 mEq total) by mouth daily. 90 tablet 3  . warfarin (COUMADIN) 2.5 MG tablet Take as directed by the coumadin clinic    . zolpidem (AMBIEN) 10 MG tablet 10 mg 1/2-1 tablet at bedtime for sleep     . sotalol (BETAPACE) 80 MG tablet Take 1 tablet (80 mg total) by mouth 2 (two) times daily. 60 tablet 1   No current facility-administered medications for this visit.    Allergies:   Review of patient's allergies indicates no known allergies.   Social History:  The patient  reports that he has quit smoking. He does not have any smokeless tobacco history on file. He reports that he drinks alcohol. He reports that he does not use illicit drugs.   Family History:  The patient is unaware of any pertinent FH.  Denies HTN or DM in family   ROS:  Please see the history of present illness.   Positive for fatigue, unsteadiness, and bruising,   All other systems are reviewed and negative.    PHYSICAL EXAM: VS:  BP 130/72 mmHg  Pulse 84  Ht 6\' 2"  (1.88 m)  Wt 226 lb 3.2 oz (102.604 kg)  BMI 29.03 kg/m2 , BMI Body mass index is 29.03 kg/(m^2). GEN: Well nourished, well developed, in no acute distress HEENT: normal Neck: no JVD, carotid bruits, or masses Cardiac: RRR; no murmurs, rubs, or gallops,no edema  Respiratory:  clear to auscultation bilaterally, normal work of breathing GI: soft, nontender, nondistended, + BS MS: no deformity or atrophy Skin: warm and dry, no rash, pacemaker pocket is well healed Neuro:  Strength and  sensation are intact Psych: euthymic mood, full affect  EKG:  EKG is ordered today. The ekg ordered today shows NSR, PR int 172 ms/ QRS 86 ms/QTc 399 ms Monthly remote checks to follow battery for ERI.     Wt Readings from Last 3 Encounters:  01/16/15 226 lb 3.2 oz (102.604 kg)  01/04/15 222 lb (100.699 kg)  12/11/14 226 lb (102.513 kg)      ASSESSMENT AND PLAN:  1.  Persistent atrial fibrillation Currently in SR with successful DCCV 11/14/14, pt feels well. Unclear reasons why pt did not undergo tikosyn loading at time of DCCV Per Dr. Johney FrameAllred, due to concerns for significant clinical deterioration if afib with RVR returns, start sotalol 80 mg bid as outpt, with EKG showing SR today. Will return in am after 2 doses ( pm tonight and am tomorrow) for measuring of QT int and again Thursday pm for further evaluation of EKG intervals.  2. AV block/PPM He has a device that is approaching ERI Will follow with monthly remote checks  3. CAD Stable No change required today    Pt seen with collaboration with Dr. Johney Frame.

## 2015-01-16 NOTE — Patient Instructions (Signed)
Your physician recommends that you schedule a follow-up appointment on Wednesday afternoon for Nurse visit for EKG and then we will plan on follow up from there after ekg is performed  Your physician has recommended you make the following change in your medication:  1)start sotalol 80mg  twice daily

## 2015-01-17 ENCOUNTER — Ambulatory Visit (INDEPENDENT_AMBULATORY_CARE_PROVIDER_SITE_OTHER): Payer: Medicare Other | Admitting: Nurse Practitioner

## 2015-01-17 DIAGNOSIS — I481 Persistent atrial fibrillation: Secondary | ICD-10-CM

## 2015-01-17 DIAGNOSIS — I4819 Other persistent atrial fibrillation: Secondary | ICD-10-CM

## 2015-01-17 NOTE — Progress Notes (Signed)
Patient ID: Alex DuhamelJohn R [name], male   DOB: 1931-01-25, 81 y.o.   MRN: [id number] EKG performed after two tablets of sotalol 80 mg. Reviewed with Dr. Johney FrameAllred. Intervals are in normal range. Return Friday am for repeat EKG with drug at steady state.

## 2015-01-19 ENCOUNTER — Ambulatory Visit (INDEPENDENT_AMBULATORY_CARE_PROVIDER_SITE_OTHER): Payer: Medicare Other | Admitting: Nurse Practitioner

## 2015-01-19 DIAGNOSIS — I480 Paroxysmal atrial fibrillation: Secondary | ICD-10-CM | POA: Diagnosis not present

## 2015-01-19 NOTE — Patient Instructions (Signed)
Your physician recommends that you schedule a follow-up appointment in 3 months with Dr Allred    

## 2015-01-19 NOTE — Progress Notes (Signed)
Patient here for EKG following sotalol initiation.  EKG intervals stable per Rudi Cocoonna Carroll, NP. Appointment made for 3 months with Dr. Johney FrameAllred. Encouraged to call if any problems/questions

## 2015-01-23 ENCOUNTER — Encounter: Payer: Self-pay | Admitting: Internal Medicine

## 2015-01-24 ENCOUNTER — Ambulatory Visit (INDEPENDENT_AMBULATORY_CARE_PROVIDER_SITE_OTHER): Payer: Medicare Other | Admitting: *Deleted

## 2015-01-24 DIAGNOSIS — R0602 Shortness of breath: Secondary | ICD-10-CM | POA: Diagnosis not present

## 2015-01-24 DIAGNOSIS — I350 Nonrheumatic aortic (valve) stenosis: Secondary | ICD-10-CM | POA: Diagnosis not present

## 2015-01-24 DIAGNOSIS — I4891 Unspecified atrial fibrillation: Secondary | ICD-10-CM

## 2015-01-24 DIAGNOSIS — I481 Persistent atrial fibrillation: Secondary | ICD-10-CM | POA: Diagnosis not present

## 2015-01-24 DIAGNOSIS — I4819 Other persistent atrial fibrillation: Secondary | ICD-10-CM

## 2015-01-24 LAB — POCT INR: INR: 3.6

## 2015-02-07 ENCOUNTER — Ambulatory Visit (INDEPENDENT_AMBULATORY_CARE_PROVIDER_SITE_OTHER): Payer: Medicare Other | Admitting: *Deleted

## 2015-02-07 DIAGNOSIS — I350 Nonrheumatic aortic (valve) stenosis: Secondary | ICD-10-CM

## 2015-02-07 DIAGNOSIS — R0602 Shortness of breath: Secondary | ICD-10-CM

## 2015-02-07 DIAGNOSIS — I4891 Unspecified atrial fibrillation: Secondary | ICD-10-CM | POA: Diagnosis not present

## 2015-02-07 DIAGNOSIS — I4819 Other persistent atrial fibrillation: Secondary | ICD-10-CM

## 2015-02-07 DIAGNOSIS — I481 Persistent atrial fibrillation: Secondary | ICD-10-CM

## 2015-02-07 LAB — POCT INR: INR: 4.1

## 2015-02-08 ENCOUNTER — Telehealth: Payer: Self-pay | Admitting: Cardiology

## 2015-02-08 ENCOUNTER — Ambulatory Visit (INDEPENDENT_AMBULATORY_CARE_PROVIDER_SITE_OTHER): Payer: Medicare Other | Admitting: *Deleted

## 2015-02-08 DIAGNOSIS — Z45010 Encounter for checking and testing of cardiac pacemaker pulse generator [battery]: Secondary | ICD-10-CM

## 2015-02-08 LAB — MDC_IDC_ENUM_SESS_TYPE_REMOTE
Battery Voltage: 2.86 V
Brady Statistic AP VP Percent: 0.18 %
Brady Statistic AS VP Percent: 0.01 %
Brady Statistic AS VS Percent: 30.41 %
Brady Statistic RV Percent Paced: 0.19 %
Lead Channel Impedance Value: 352 Ohm
Lead Channel Sensing Intrinsic Amplitude: 1.039 mV
Lead Channel Sensing Intrinsic Amplitude: 4.435 mV
Lead Channel Setting Pacing Amplitude: 2.5 V
Lead Channel Setting Sensing Sensitivity: 0.9 mV
MDC IDC MSMT LEADCHNL RV IMPEDANCE VALUE: 1360 Ohm
MDC IDC SESS DTM: 20160414192327
MDC IDC SET LEADCHNL RV PACING AMPLITUDE: 5 V
MDC IDC SET LEADCHNL RV PACING PULSEWIDTH: 1 ms
MDC IDC SET ZONE DETECTION INTERVAL: 350 ms
MDC IDC STAT BRADY AP VS PERCENT: 69.4 %
MDC IDC STAT BRADY RA PERCENT PACED: 69.58 %
Zone Setting Detection Interval: 430 ms

## 2015-02-08 NOTE — Progress Notes (Signed)
Remote pacemaker transmission.   

## 2015-02-08 NOTE — Telephone Encounter (Signed)
Confirmed remote appt w/ pt wife.  

## 2015-02-22 ENCOUNTER — Ambulatory Visit (INDEPENDENT_AMBULATORY_CARE_PROVIDER_SITE_OTHER): Payer: Medicare Other | Admitting: *Deleted

## 2015-02-22 DIAGNOSIS — I4891 Unspecified atrial fibrillation: Secondary | ICD-10-CM | POA: Diagnosis not present

## 2015-02-22 DIAGNOSIS — I481 Persistent atrial fibrillation: Secondary | ICD-10-CM | POA: Diagnosis not present

## 2015-02-22 DIAGNOSIS — I4819 Other persistent atrial fibrillation: Secondary | ICD-10-CM

## 2015-02-22 DIAGNOSIS — R0602 Shortness of breath: Secondary | ICD-10-CM

## 2015-02-22 DIAGNOSIS — I350 Nonrheumatic aortic (valve) stenosis: Secondary | ICD-10-CM | POA: Diagnosis not present

## 2015-02-22 LAB — POCT INR: INR: 2.3

## 2015-02-27 ENCOUNTER — Encounter: Payer: Self-pay | Admitting: Cardiology

## 2015-03-05 ENCOUNTER — Encounter: Payer: Self-pay | Admitting: Internal Medicine

## 2015-03-12 ENCOUNTER — Ambulatory Visit (INDEPENDENT_AMBULATORY_CARE_PROVIDER_SITE_OTHER): Payer: Medicare Other | Admitting: *Deleted

## 2015-03-12 DIAGNOSIS — Z45010 Encounter for checking and testing of cardiac pacemaker pulse generator [battery]: Secondary | ICD-10-CM

## 2015-03-12 NOTE — Progress Notes (Signed)
Remote pacemaker transmission.   

## 2015-03-15 ENCOUNTER — Ambulatory Visit (INDEPENDENT_AMBULATORY_CARE_PROVIDER_SITE_OTHER): Payer: Medicare Other | Admitting: *Deleted

## 2015-03-15 DIAGNOSIS — I350 Nonrheumatic aortic (valve) stenosis: Secondary | ICD-10-CM

## 2015-03-15 DIAGNOSIS — I481 Persistent atrial fibrillation: Secondary | ICD-10-CM | POA: Diagnosis not present

## 2015-03-15 DIAGNOSIS — I4819 Other persistent atrial fibrillation: Secondary | ICD-10-CM

## 2015-03-15 DIAGNOSIS — I4891 Unspecified atrial fibrillation: Secondary | ICD-10-CM

## 2015-03-15 DIAGNOSIS — R0602 Shortness of breath: Secondary | ICD-10-CM

## 2015-03-15 LAB — CUP PACEART REMOTE DEVICE CHECK
Brady Statistic AP VP Percent: 0.21 %
Brady Statistic AP VS Percent: 85.9 %
Brady Statistic AS VP Percent: 0.01 %
Brady Statistic AS VS Percent: 13.88 %
Lead Channel Impedance Value: 1360 Ohm
Lead Channel Impedance Value: 352 Ohm
Lead Channel Sensing Intrinsic Amplitude: 0.909 mV
Lead Channel Sensing Intrinsic Amplitude: 4.776 mV
Lead Channel Setting Pacing Amplitude: 2.5 V
Lead Channel Setting Pacing Amplitude: 5 V
Lead Channel Setting Pacing Pulse Width: 1 ms
Lead Channel Setting Sensing Sensitivity: 0.9 mV
MDC IDC MSMT BATTERY VOLTAGE: 2.85 V
MDC IDC SESS DTM: 20160516135230
MDC IDC SET ZONE DETECTION INTERVAL: 430 ms
MDC IDC STAT BRADY RA PERCENT PACED: 86.12 %
MDC IDC STAT BRADY RV PERCENT PACED: 0.22 %
Zone Setting Detection Interval: 350 ms

## 2015-03-15 LAB — POCT INR: INR: 3.1

## 2015-03-21 ENCOUNTER — Encounter: Payer: Self-pay | Admitting: Cardiology

## 2015-03-22 ENCOUNTER — Other Ambulatory Visit: Payer: Self-pay | Admitting: Nurse Practitioner

## 2015-03-28 ENCOUNTER — Encounter: Payer: Self-pay | Admitting: Internal Medicine

## 2015-04-02 ENCOUNTER — Ambulatory Visit (INDEPENDENT_AMBULATORY_CARE_PROVIDER_SITE_OTHER): Payer: Medicare Other | Admitting: *Deleted

## 2015-04-02 ENCOUNTER — Telehealth (HOSPITAL_COMMUNITY): Payer: Self-pay | Admitting: *Deleted

## 2015-04-02 ENCOUNTER — Telehealth: Payer: Self-pay | Admitting: *Deleted

## 2015-04-02 DIAGNOSIS — I4891 Unspecified atrial fibrillation: Secondary | ICD-10-CM | POA: Diagnosis not present

## 2015-04-02 DIAGNOSIS — I481 Persistent atrial fibrillation: Secondary | ICD-10-CM | POA: Diagnosis not present

## 2015-04-02 DIAGNOSIS — I4819 Other persistent atrial fibrillation: Secondary | ICD-10-CM

## 2015-04-02 DIAGNOSIS — R0602 Shortness of breath: Secondary | ICD-10-CM | POA: Diagnosis not present

## 2015-04-02 DIAGNOSIS — I350 Nonrheumatic aortic (valve) stenosis: Secondary | ICD-10-CM | POA: Diagnosis not present

## 2015-04-02 LAB — POCT INR: INR: 6.4

## 2015-04-02 LAB — PROTIME-INR
INR: 6.9 ratio (ref 0.8–1.0)
PROTHROMBIN TIME: 72.8 s — AB (ref 9.6–13.1)

## 2015-04-02 MED ORDER — SOTALOL HCL 80 MG PO TABS: 80.0000 mg | ORAL_TABLET | Freq: Two times a day (BID) | ORAL | Status: DC

## 2015-04-02 NOTE — Telephone Encounter (Signed)
See coumadin encounter of this date 

## 2015-04-02 NOTE — Telephone Encounter (Signed)
pts wife called back and stated she found another bottle of sotalol and patient stated he has been taking that medication in the bottle as prescribed.  States just needs refill sent in as he only has medication to last until Wednesday.  Will cancel appt for Friday and prescription called in.

## 2015-04-02 NOTE — Telephone Encounter (Signed)
Patient's wife called in stating she found a empty bottle of sotalol and is unsure if he is supposed to continue this or not?  States she is "taking over" managing his medications so wants to make sure everything is correct.    Told her yes he is supposed to be on sotalol 80mg  bid.  She is unsure how long he has not taken the medication.  Instructed wife that patient would need ekg prior to restarting sotalol to ensure pt is still in NSR.  Appt made for Friday to have ekg performed and depending on what ekg showed will restart medication.

## 2015-04-02 NOTE — Telephone Encounter (Signed)
[facility]; Critical PT/INR 72.8; 6.9 Called and Forwarding to Coumadin Clinic

## 2015-04-06 ENCOUNTER — Ambulatory Visit (HOSPITAL_COMMUNITY): Payer: Medicare Other | Admitting: Nurse Practitioner

## 2015-04-06 ENCOUNTER — Ambulatory Visit (INDEPENDENT_AMBULATORY_CARE_PROVIDER_SITE_OTHER): Payer: Medicare Other | Admitting: *Deleted

## 2015-04-06 DIAGNOSIS — I481 Persistent atrial fibrillation: Secondary | ICD-10-CM | POA: Diagnosis not present

## 2015-04-06 DIAGNOSIS — I4819 Other persistent atrial fibrillation: Secondary | ICD-10-CM

## 2015-04-06 DIAGNOSIS — I4891 Unspecified atrial fibrillation: Secondary | ICD-10-CM | POA: Diagnosis not present

## 2015-04-06 DIAGNOSIS — I350 Nonrheumatic aortic (valve) stenosis: Secondary | ICD-10-CM | POA: Diagnosis not present

## 2015-04-06 DIAGNOSIS — R0602 Shortness of breath: Secondary | ICD-10-CM

## 2015-04-06 LAB — POCT INR: INR: 1.5

## 2015-04-12 ENCOUNTER — Encounter: Payer: Self-pay | Admitting: Internal Medicine

## 2015-04-12 ENCOUNTER — Ambulatory Visit (INDEPENDENT_AMBULATORY_CARE_PROVIDER_SITE_OTHER): Payer: Medicare Other | Admitting: *Deleted

## 2015-04-12 ENCOUNTER — Ambulatory Visit (INDEPENDENT_AMBULATORY_CARE_PROVIDER_SITE_OTHER): Payer: Medicare Other | Admitting: Internal Medicine

## 2015-04-12 VITALS — BP 102/56 | HR 62 | Ht 74.0 in | Wt 222.8 lb

## 2015-04-12 DIAGNOSIS — I4819 Other persistent atrial fibrillation: Secondary | ICD-10-CM

## 2015-04-12 DIAGNOSIS — I350 Nonrheumatic aortic (valve) stenosis: Secondary | ICD-10-CM | POA: Diagnosis not present

## 2015-04-12 DIAGNOSIS — I480 Paroxysmal atrial fibrillation: Secondary | ICD-10-CM

## 2015-04-12 DIAGNOSIS — I481 Persistent atrial fibrillation: Secondary | ICD-10-CM

## 2015-04-12 DIAGNOSIS — I2510 Atherosclerotic heart disease of native coronary artery without angina pectoris: Secondary | ICD-10-CM

## 2015-04-12 DIAGNOSIS — I4891 Unspecified atrial fibrillation: Secondary | ICD-10-CM

## 2015-04-12 DIAGNOSIS — R0602 Shortness of breath: Secondary | ICD-10-CM | POA: Diagnosis not present

## 2015-04-12 LAB — CUP PACEART INCLINIC DEVICE CHECK
Battery Voltage: 2.85 V
Brady Statistic AP VS Percent: 74.19 %
Brady Statistic AS VP Percent: 0.01 %
Lead Channel Impedance Value: 344 Ohm
Lead Channel Pacing Threshold Amplitude: 1 V
Lead Channel Pacing Threshold Amplitude: 3 V
Lead Channel Pacing Threshold Pulse Width: 0.4 ms
Lead Channel Sensing Intrinsic Amplitude: 6.8224
Lead Channel Setting Pacing Amplitude: 2.5 V
Lead Channel Setting Pacing Amplitude: 5 V
MDC IDC MSMT LEADCHNL RA SENSING INTR AMPL: 1.2123
MDC IDC MSMT LEADCHNL RV IMPEDANCE VALUE: 1376 Ohm
MDC IDC MSMT LEADCHNL RV PACING THRESHOLD PULSEWIDTH: 1 ms
MDC IDC SESS DTM: 20160616160839
MDC IDC SET LEADCHNL RV PACING PULSEWIDTH: 1 ms
MDC IDC SET LEADCHNL RV SENSING SENSITIVITY: 0.9 mV
MDC IDC SET ZONE DETECTION INTERVAL: 350 ms
MDC IDC STAT BRADY AP VP PERCENT: 0.24 %
MDC IDC STAT BRADY AS VS PERCENT: 25.56 %
MDC IDC STAT BRADY RA PERCENT PACED: 74.43 %
MDC IDC STAT BRADY RV PERCENT PACED: 0.25 %
Zone Setting Detection Interval: 430 ms

## 2015-04-12 LAB — POCT INR: INR: 1.6

## 2015-04-12 NOTE — Progress Notes (Signed)
Electrophysiology Office Note   Date:  04/12/2015   ID:  Alex [name], DOB 06-10-1931, MRN [id number]  PCP:  [name], MD  Cardiologist:  Dr Excell Seltzer  Chief Complaint  Patient presents with  . Persistent AFIB     History of Present Illness: Alex [name] is a 81 y.o. male who presents today for EP followup.  He also has a PPM which was impmlanted 09/08/2005 for syncope and AV block.  He has done well since he had his pacemaker implanted.  He has not had any further syncope.  He has coronary artery disease, aortic valve disease, and history of heart block status post permanent pacemaker placement. He has undergone coronary stenting in 2005 and 2009. He ultimately developed symptomatic severe aortic stenosis with progressive multivessel CAD and he underwent bioprosthetic aortic valve replacement and three-vessel CABG in 2012. His postoperative course was complicated by atrial fibrillation requiring amiodarone and warfarin. Atrial fibrillation was limited to the postoperative period and he has not required long-term antiarrhythmic therapy or anticoagulation. In 2014 he developed left arm pain and epigastric discomfort. A nuclear scan showed a fixed inferoposterior defect with peri-infarct ischemia and the patient underwent cardiac catheterization for further evaluation. This demonstrated multivessel CAD with mild to moderate nonobstructive RCA stenosis, severe LAD and left circumflex stenoses, and continued patency of the LIMA to LAD graft as well as the sequential vein graftto the obtuse marginal branches.  He underwent cardioversion several months ago and has been placed on sotalol.  He is doing very well and has maintained sinus rhythm since that time. Today, he denies symptoms of  chest pain,  ower extremity edema, claudication, dizziness, presyncope, syncope, bleeding, or neurologic sequela. The patient is tolerating medications without difficulties and is otherwise without complaint  today.   Past Medical History  Diagnosis Date  . COPD (chronic obstructive pulmonary disease)   . Hyperlipidemia   . Morbid obesity     weight 243 pounds, BMI 31.2kg/m2, BSA 2.36 square meters  . Obstructive sleep apnea     compliant with CPAP  . History of coronary artery disease     status post stenting of the marginal circumflex in 12/2003 and again in 2009  . Aortic stenosis     s/p AVR by Dr Laneta Simmers    Current Outpatient Prescriptions  Medication Sig Dispense Refill  . aspirin 81 MG tablet Take 81 mg by mouth daily.    Marland Kitchen atorvastatin (LIPITOR) 80 MG tablet Take 1 tablet (80 mg total) by mouth daily. 90 tablet 3  . escitalopram (LEXAPRO) 10 MG tablet Take 10 mg by mouth at bedtime.    . furosemide (LASIX) 20 MG tablet Take 20 mg by mouth daily.    . metoprolol succinate (TOPROL-XL) 25 MG 24 hr tablet Take 25 mg by mouth at bedtime.   6  . Multiple Vitamin (MULTIVITAMIN) tablet Take 1 tablet by mouth daily.    . potassium chloride (K-DUR) 10 MEQ tablet Take 1 tablet (10 mEq total) by mouth daily. 90 tablet 3  . sotalol (BETAPACE) 80 MG tablet Take 1 tablet (80 mg total) by mouth 2 (two) times daily. 60 tablet 6  . warfarin (COUMADIN) 2.5 MG tablet Take as directed by the coumadin clinic    . zolpidem (AMBIEN) 10 MG tablet Take 5-10 mg by mouth at bedtime.      No current facility-administered medications for this visit.    Allergies:   Review of patient's allergies indicates no known allergies.  Social History:  The patient  reports that he quit smoking about 11 years ago. His smoking use included Cigarettes. He started smoking about 41 years ago. He has a 15 pack-year smoking history. He does not have any smokeless tobacco history on file. He reports that he drinks alcohol. He reports that he does not use illicit drugs.   Family History:  The patient is unaware of any pertinent FH.  Denies HTN or DM in family   ROS:  Please see the history of present illness.   Positive for  fatigue, unsteadiness, and bruising,   All other systems are reviewed and negative.    PHYSICAL EXAM: VS:  BP 102/56 mmHg  Pulse 62  Ht 6\' 2"  (1.88 m)  Wt 101.061 kg (222 lb 12.8 oz)  BMI 28.59 kg/m2 , BMI Body mass index is 28.59 kg/(m^2). GEN: Well nourished, well developed, in no acute distress HEENT: normal Neck: no JVD, carotid bruits, or masses Cardiac: RRR; no murmurs, rubs, or gallops,no edema  Respiratory:  clear to auscultation bilaterally, normal work of breathing GI: soft, nontender, nondistended, + BS MS: no deformity or atrophy Skin: warm and dry, no rash, pacemaker pocket is well healed Neuro:  Strength and sensation are intact Psych: euthymic mood, full affect  EKG:  EKG is ordered today. The ekg ordered today shows atrial pacing with first degree AV block, Qtc 408 msec  Recent Labs: 10/31/2014: ALT 22 11/09/2014: Hemoglobin 13.8; Platelets 154.0 01/04/2015: BUN 22; Creatinine, Ser 0.85; Potassium 4.5; Pro B Natriuretic peptide (BNP) 205.0*; Sodium 139  No results found for requested labs within last 365 days.     CrCl cannot be calculated (Patient has no serum creatinine result on file.).   Wt Readings from Last 3 Encounters:  04/12/15 101.061 kg (222 lb 12.8 oz)  01/16/15 102.604 kg (226 lb 3.2 oz)  01/04/15 100.699 kg (222 lb)     Other studies Reviewed:  Additional studies/ records reviewed today include: echo 11/06/14 EF 55%, LA 45 mm, mild MR, Cath reveals stable CAD Device interrogation is reviewed and reveals battery approaching ERI, RV pacing threshold is hig ASSESSMENT AND PLAN:  1.  Persistent atrial fibrillation The patient has symptomatic persistent atrial fibrillation.  He is maintaining sinus rhythm with sotalol Check bmet, mg today He will continue coumadin.  chads2vasc score is at least 3  2. SSS He does not V pace at this time RV pacing threshold is chronically elevated.  Given advanced age, we will try to avoid lead revision. He has  an enrhythm that is approaching ERI We will follow remotely  3. CAD Stable No change required today   Current medicines are reviewed at length with the patient today.  The patient is without any concerns regarding medicines and no changes are made today.    Disposition:   FU with Dr Excell Seltzer as scheduled carelink every month Return to see me in 1 year unless he reaches ERI in the interim   Randolm Idol MD  04/12/2015 9:46 PM      [facility] [address] [place] [address]  [phone] (office) [phone] (fax)

## 2015-04-12 NOTE — Patient Instructions (Signed)
Medication Instructions:  Your physician recommends that you continue on your current medications as directed. Please refer to the Current Medication list given to you today.   Labwork: Your physician recommends that you return for lab work today: BMP/MAG   Testing/Procedures: None ordered  Follow-Up: Your physician wants you to follow-up in: 12 months with Dr Johney Frame Bonita Quin will receive a reminder letter in the mail two months in advance. If you don't receive a letter, please call our office to schedule the follow-up appointment.  Remote monitoring is used to monitor your Pacemaker or ICD from home. This monitoring reduces the number of office visits required to check your device to one time per year. It allows [place] to keep an eye on the functioning of your device to ensure it is working properly. You are scheduled for a device check from home on 05/10/15. You may send your transmission at any time that day. If you have a wireless device, the transmission will be sent automatically. After your physician reviews your transmission, you will receive a postcard with your next transmission date.     Any Other Special Instructions Will Be Listed Below (If Applicable).

## 2015-04-13 LAB — BASIC METABOLIC PANEL
BUN: 26 mg/dL — ABNORMAL HIGH (ref 6–23)
CHLORIDE: 101 meq/L (ref 96–112)
CO2: 30 meq/L (ref 19–32)
CREATININE: 0.93 mg/dL (ref 0.40–1.50)
Calcium: 9.4 mg/dL (ref 8.4–10.5)
GFR: 82.61 mL/min (ref >60.00)
GLUCOSE: 96 mg/dL (ref 70–99)
Potassium: 4.2 mEq/L (ref 3.5–5.1)
Sodium: 136 mEq/L (ref 135–145)

## 2015-04-13 LAB — MAGNESIUM: Magnesium: 1.9 mg/dL (ref 1.5–2.5)

## 2015-04-23 ENCOUNTER — Ambulatory Visit (INDEPENDENT_AMBULATORY_CARE_PROVIDER_SITE_OTHER): Payer: Medicare Other | Admitting: *Deleted

## 2015-04-23 DIAGNOSIS — I481 Persistent atrial fibrillation: Secondary | ICD-10-CM

## 2015-04-23 DIAGNOSIS — R0602 Shortness of breath: Secondary | ICD-10-CM

## 2015-04-23 DIAGNOSIS — I350 Nonrheumatic aortic (valve) stenosis: Secondary | ICD-10-CM | POA: Diagnosis not present

## 2015-04-23 DIAGNOSIS — I4891 Unspecified atrial fibrillation: Secondary | ICD-10-CM

## 2015-04-23 DIAGNOSIS — I4819 Other persistent atrial fibrillation: Secondary | ICD-10-CM

## 2015-04-23 LAB — POCT INR: INR: 1.9

## 2015-05-03 ENCOUNTER — Ambulatory Visit (INDEPENDENT_AMBULATORY_CARE_PROVIDER_SITE_OTHER): Payer: Medicare Other | Admitting: *Deleted

## 2015-05-03 ENCOUNTER — Encounter: Payer: Self-pay | Admitting: Internal Medicine

## 2015-05-03 DIAGNOSIS — Z45010 Encounter for checking and testing of cardiac pacemaker pulse generator [battery]: Secondary | ICD-10-CM

## 2015-05-03 NOTE — Progress Notes (Signed)
Remote pacemaker transmission.   

## 2015-05-04 ENCOUNTER — Ambulatory Visit (INDEPENDENT_AMBULATORY_CARE_PROVIDER_SITE_OTHER): Payer: Medicare Other | Admitting: *Deleted

## 2015-05-04 DIAGNOSIS — R0602 Shortness of breath: Secondary | ICD-10-CM | POA: Diagnosis not present

## 2015-05-04 DIAGNOSIS — I4891 Unspecified atrial fibrillation: Secondary | ICD-10-CM | POA: Diagnosis not present

## 2015-05-04 DIAGNOSIS — I481 Persistent atrial fibrillation: Secondary | ICD-10-CM

## 2015-05-04 DIAGNOSIS — I350 Nonrheumatic aortic (valve) stenosis: Secondary | ICD-10-CM | POA: Diagnosis not present

## 2015-05-04 DIAGNOSIS — I4819 Other persistent atrial fibrillation: Secondary | ICD-10-CM

## 2015-05-04 LAB — POCT INR: INR: 3.1

## 2015-05-05 LAB — CUP PACEART REMOTE DEVICE CHECK
Battery Voltage: 2.85 V
Brady Statistic AP VP Percent: 0.08 %
Brady Statistic AS VP Percent: 0 %
Brady Statistic RA Percent Paced: 82.5 %
Date Time Interrogation Session: 20160707120643
Lead Channel Impedance Value: 1360 Ohm
Lead Channel Setting Pacing Amplitude: 5 V
Lead Channel Setting Pacing Pulse Width: 1 ms
MDC IDC MSMT LEADCHNL RA IMPEDANCE VALUE: 344 Ohm
MDC IDC MSMT LEADCHNL RA SENSING INTR AMPL: 0.606 mV
MDC IDC MSMT LEADCHNL RV SENSING INTR AMPL: 4.776 mV
MDC IDC SET LEADCHNL RA PACING AMPLITUDE: 2.5 V
MDC IDC SET LEADCHNL RV SENSING SENSITIVITY: 0.9 mV
MDC IDC SET ZONE DETECTION INTERVAL: 350 ms
MDC IDC SET ZONE DETECTION INTERVAL: 430 ms
MDC IDC STAT BRADY AP VS PERCENT: 82.42 %
MDC IDC STAT BRADY AS VS PERCENT: 17.5 %
MDC IDC STAT BRADY RV PERCENT PACED: 0.08 %

## 2015-05-09 ENCOUNTER — Encounter: Payer: Self-pay | Admitting: Cardiology

## 2015-05-18 ENCOUNTER — Ambulatory Visit (INDEPENDENT_AMBULATORY_CARE_PROVIDER_SITE_OTHER): Payer: Medicare Other

## 2015-05-18 DIAGNOSIS — I4891 Unspecified atrial fibrillation: Secondary | ICD-10-CM | POA: Diagnosis not present

## 2015-05-18 DIAGNOSIS — I4819 Other persistent atrial fibrillation: Secondary | ICD-10-CM

## 2015-05-18 DIAGNOSIS — R0602 Shortness of breath: Secondary | ICD-10-CM

## 2015-05-18 DIAGNOSIS — I350 Nonrheumatic aortic (valve) stenosis: Secondary | ICD-10-CM | POA: Diagnosis not present

## 2015-05-18 DIAGNOSIS — I481 Persistent atrial fibrillation: Secondary | ICD-10-CM | POA: Diagnosis not present

## 2015-05-18 LAB — POCT INR: INR: 3.1

## 2015-05-29 ENCOUNTER — Telehealth: Payer: Self-pay | Admitting: Cardiovascular Disease

## 2015-05-29 NOTE — Telephone Encounter (Signed)
nEW message   Pt wife is calling because her husband is tired and   Taking naps, she said this is not like him

## 2015-05-29 NOTE — Telephone Encounter (Signed)
I spoke with the pt's wife and she has noticed that the pt is tired during the day and has to take a nap.  The pt was prescribed Ambien but has not been taking this medication because insurance will not cover the medication.  The pt does have a current prescription for Ambien and the pt's wife will go ahead and get this filled since the pt is not sleeping well at night.   I also asked if the pt has had routine follow-up for sleep apnea and had CPAP machine checked for function.  The wife is unsure of this information and she will touch base with PCP to see if the pt needs to be re-evaluated in regards to sleep apnea. The pt has a pending appointment with Dr Excell Seltzer in October and they will call the office if the pt has any further questions or concerns.

## 2015-06-01 ENCOUNTER — Ambulatory Visit (INDEPENDENT_AMBULATORY_CARE_PROVIDER_SITE_OTHER): Payer: Medicare Other | Admitting: *Deleted

## 2015-06-01 DIAGNOSIS — R0602 Shortness of breath: Secondary | ICD-10-CM | POA: Diagnosis not present

## 2015-06-01 DIAGNOSIS — I350 Nonrheumatic aortic (valve) stenosis: Secondary | ICD-10-CM

## 2015-06-01 DIAGNOSIS — I4891 Unspecified atrial fibrillation: Secondary | ICD-10-CM

## 2015-06-01 DIAGNOSIS — I4819 Other persistent atrial fibrillation: Secondary | ICD-10-CM

## 2015-06-01 DIAGNOSIS — I481 Persistent atrial fibrillation: Secondary | ICD-10-CM

## 2015-06-01 LAB — POCT INR: INR: 2.9

## 2015-06-11 ENCOUNTER — Ambulatory Visit (INDEPENDENT_AMBULATORY_CARE_PROVIDER_SITE_OTHER): Payer: Medicare Other | Admitting: *Deleted

## 2015-06-11 ENCOUNTER — Telehealth: Payer: Self-pay | Admitting: Cardiology

## 2015-06-11 DIAGNOSIS — Z950 Presence of cardiac pacemaker: Secondary | ICD-10-CM

## 2015-06-11 NOTE — Telephone Encounter (Signed)
Confirmed remote transmission w/ pt wife.   

## 2015-06-11 NOTE — Progress Notes (Signed)
Remote pacemaker transmission.   

## 2015-06-13 LAB — CUP PACEART REMOTE DEVICE CHECK
Battery Voltage: 2.83 V
Brady Statistic AP VS Percent: 58.54 %
Brady Statistic RA Percent Paced: 58.62 %
Brady Statistic RV Percent Paced: 0.1 %
Lead Channel Impedance Value: 360 Ohm
Lead Channel Sensing Intrinsic Amplitude: 5.117 mV
Lead Channel Setting Pacing Amplitude: 2.5 V
Lead Channel Setting Pacing Amplitude: 5 V
Lead Channel Setting Pacing Pulse Width: 1 ms
Lead Channel Setting Sensing Sensitivity: 0.9 mV
MDC IDC MSMT LEADCHNL RA SENSING INTR AMPL: 1.039 mV
MDC IDC MSMT LEADCHNL RV IMPEDANCE VALUE: 1424 Ohm
MDC IDC SESS DTM: 20160815184426
MDC IDC SET ZONE DETECTION INTERVAL: 350 ms
MDC IDC STAT BRADY AP VP PERCENT: 0.09 %
MDC IDC STAT BRADY AS VP PERCENT: 0.01 %
MDC IDC STAT BRADY AS VS PERCENT: 41.37 %
Zone Setting Detection Interval: 430 ms

## 2015-06-19 ENCOUNTER — Encounter: Payer: Self-pay | Admitting: Cardiology

## 2015-06-22 ENCOUNTER — Encounter: Payer: Self-pay | Admitting: Internal Medicine

## 2015-06-22 ENCOUNTER — Ambulatory Visit (INDEPENDENT_AMBULATORY_CARE_PROVIDER_SITE_OTHER): Payer: Medicare Other | Admitting: *Deleted

## 2015-06-22 DIAGNOSIS — I481 Persistent atrial fibrillation: Secondary | ICD-10-CM

## 2015-06-22 DIAGNOSIS — R0602 Shortness of breath: Secondary | ICD-10-CM

## 2015-06-22 DIAGNOSIS — I4819 Other persistent atrial fibrillation: Secondary | ICD-10-CM

## 2015-06-22 DIAGNOSIS — I4891 Unspecified atrial fibrillation: Secondary | ICD-10-CM

## 2015-06-22 DIAGNOSIS — I350 Nonrheumatic aortic (valve) stenosis: Secondary | ICD-10-CM | POA: Diagnosis not present

## 2015-06-22 LAB — POCT INR: INR: 2.5

## 2015-07-12 ENCOUNTER — Ambulatory Visit (INDEPENDENT_AMBULATORY_CARE_PROVIDER_SITE_OTHER): Payer: Medicare Other | Admitting: *Deleted

## 2015-07-12 DIAGNOSIS — I480 Paroxysmal atrial fibrillation: Secondary | ICD-10-CM

## 2015-07-12 NOTE — Progress Notes (Signed)
Remote pacemaker transmission.   

## 2015-07-20 ENCOUNTER — Ambulatory Visit (INDEPENDENT_AMBULATORY_CARE_PROVIDER_SITE_OTHER): Payer: Medicare Other

## 2015-07-20 DIAGNOSIS — I350 Nonrheumatic aortic (valve) stenosis: Secondary | ICD-10-CM

## 2015-07-20 DIAGNOSIS — I4819 Other persistent atrial fibrillation: Secondary | ICD-10-CM

## 2015-07-20 DIAGNOSIS — I4891 Unspecified atrial fibrillation: Secondary | ICD-10-CM | POA: Diagnosis not present

## 2015-07-20 DIAGNOSIS — R0602 Shortness of breath: Secondary | ICD-10-CM

## 2015-07-20 DIAGNOSIS — I481 Persistent atrial fibrillation: Secondary | ICD-10-CM

## 2015-07-20 LAB — POCT INR: INR: 2

## 2015-07-26 LAB — CUP PACEART REMOTE DEVICE CHECK
Battery Voltage: 2.83 V
Brady Statistic AP VP Percent: 0.16 %
Brady Statistic AS VP Percent: 0.03 %
Brady Statistic RA Percent Paced: 40.18 %
Brady Statistic RV Percent Paced: 0.18 %
Date Time Interrogation Session: 20160915131505
Lead Channel Sensing Intrinsic Amplitude: 0.996 mV
Lead Channel Sensing Intrinsic Amplitude: 4.435 mV
Lead Channel Setting Pacing Amplitude: 2.5 V
Lead Channel Setting Pacing Amplitude: 5 V
Lead Channel Setting Pacing Pulse Width: 1 ms
Lead Channel Setting Sensing Sensitivity: 0.9 mV
MDC IDC MSMT LEADCHNL RA IMPEDANCE VALUE: 352 Ohm
MDC IDC MSMT LEADCHNL RV IMPEDANCE VALUE: 1424 Ohm
MDC IDC STAT BRADY AP VS PERCENT: 40.03 %
MDC IDC STAT BRADY AS VS PERCENT: 59.79 %
Zone Setting Detection Interval: 350 ms
Zone Setting Detection Interval: 430 ms

## 2015-08-07 ENCOUNTER — Encounter: Payer: Self-pay | Admitting: Cardiology

## 2015-08-10 ENCOUNTER — Other Ambulatory Visit: Payer: Self-pay | Admitting: Cardiovascular Disease

## 2015-08-10 NOTE — Telephone Encounter (Signed)
Alex BollmanMichael Cooper, MD at 01/04/2015 10:29 AM  furosemide (LASIX) 20 MG tabletTake 20 mg by mouth daily ASSESSMENT AND PLAN: 1. Chronic diastolic heart failure, New York Heart Association functional class II: The patient is doing much better on daily furosemide. His volume status is improved on exam. We'll continue his same medical program which was reviewed today. Blood pressure is very well controlled. We'll check a metabolic panel and BNP. I will see him back in 6 months unless problems arise.

## 2015-08-13 ENCOUNTER — Telehealth: Payer: Self-pay | Admitting: Cardiology

## 2015-08-13 ENCOUNTER — Ambulatory Visit (INDEPENDENT_AMBULATORY_CARE_PROVIDER_SITE_OTHER): Payer: Medicare Other | Admitting: *Deleted

## 2015-08-13 DIAGNOSIS — Z950 Presence of cardiac pacemaker: Secondary | ICD-10-CM

## 2015-08-13 NOTE — Progress Notes (Signed)
Remote pacemaker transmission.   

## 2015-08-13 NOTE — Telephone Encounter (Signed)
Confirmed remote transmission w/ pt wife.   

## 2015-08-14 ENCOUNTER — Encounter: Payer: Self-pay | Admitting: Cardiology

## 2015-08-14 LAB — CUP PACEART REMOTE DEVICE CHECK
Battery Voltage: 2.82 V
Brady Statistic AP VP Percent: 0.12 %
Brady Statistic AP VS Percent: 44.79 %
Brady Statistic AS VS Percent: 55.06 %
Brady Statistic RA Percent Paced: 44.91 %
Date Time Interrogation Session: 20161017193150
Implantable Lead Implant Date: 20061113
Implantable Lead Location: 753859
Implantable Lead Model: 5594
Lead Channel Impedance Value: 1472 Ohm
Lead Channel Sensing Intrinsic Amplitude: 0.649 mV
Lead Channel Sensing Intrinsic Amplitude: 4.776 mV
Lead Channel Setting Pacing Pulse Width: 1 ms
Lead Channel Setting Sensing Sensitivity: 0.9 mV
MDC IDC LEAD IMPLANT DT: 20061113
MDC IDC LEAD LOCATION: 753860
MDC IDC MSMT LEADCHNL RA IMPEDANCE VALUE: 352 Ohm
MDC IDC SET LEADCHNL RA PACING AMPLITUDE: 2.5 V
MDC IDC SET LEADCHNL RV PACING AMPLITUDE: 5 V
MDC IDC STAT BRADY AS VP PERCENT: 0.03 %
MDC IDC STAT BRADY RV PERCENT PACED: 0.15 %

## 2015-08-16 ENCOUNTER — Encounter: Payer: Self-pay | Admitting: Internal Medicine

## 2015-08-20 ENCOUNTER — Ambulatory Visit (INDEPENDENT_AMBULATORY_CARE_PROVIDER_SITE_OTHER): Payer: Medicare Other | Admitting: *Deleted

## 2015-08-20 ENCOUNTER — Encounter: Payer: Self-pay | Admitting: Cardiovascular Disease

## 2015-08-20 ENCOUNTER — Ambulatory Visit (INDEPENDENT_AMBULATORY_CARE_PROVIDER_SITE_OTHER): Payer: Medicare Other | Admitting: Cardiovascular Disease

## 2015-08-20 VITALS — BP 104/50 | HR 64 | Ht 74.0 in | Wt 230.1 lb

## 2015-08-20 DIAGNOSIS — I5032 Chronic diastolic (congestive) heart failure: Secondary | ICD-10-CM | POA: Diagnosis not present

## 2015-08-20 DIAGNOSIS — R0602 Shortness of breath: Secondary | ICD-10-CM

## 2015-08-20 DIAGNOSIS — I359 Nonrheumatic aortic valve disorder, unspecified: Secondary | ICD-10-CM | POA: Diagnosis not present

## 2015-08-20 DIAGNOSIS — I4819 Other persistent atrial fibrillation: Secondary | ICD-10-CM

## 2015-08-20 DIAGNOSIS — I4891 Unspecified atrial fibrillation: Secondary | ICD-10-CM

## 2015-08-20 DIAGNOSIS — I350 Nonrheumatic aortic (valve) stenosis: Secondary | ICD-10-CM

## 2015-08-20 DIAGNOSIS — I481 Persistent atrial fibrillation: Secondary | ICD-10-CM

## 2015-08-20 LAB — POCT INR: INR: 1.3

## 2015-08-20 NOTE — Progress Notes (Signed)
Cardiology Office Note Date:  08/20/2015   ID:  Alex [name], DOB [birth year], MRN [id number]  PCP:  Elijio MilesWEAVER,Seven W, MD  Cardiologist:  Tonny Bollmanooper, Amey Hossain, MD    Chief Complaint  Patient presents with  . Follow-up    no complaints     History of Present Illness: Alex [name] is a 81 y.o. male who presents for follow-up of diastolic heart failure, atrial fibrillation, aortic valve disease, and CAD. He has undergone permanent pacemaker placement and has established with Dr Johney FrameAllred for device follow-up.   He complains of generalized fatigue. He also complains of shortness of breath with exertion. Symptoms are slowly getting worse. He denies orthopnea, PND, or leg swelling. He denies bleeding problems on combination of low-dose aspirin and warfarin.  He underwent cardiac catheterizationafter an abnormal stress test in 2014. This demonstrated multivessel CAD with mild to moderate nonobstructive RCA stenosis, severe LAD and left circumflex stenoses, and continued patency of the LIMA to LAD graft as well as the sequential vein graftto the obtuse marginal branches.  Within the past year he was noted to be in atrial fibrillation. He has been placed on sotalol and is maintaining sinus rhythm. Device checks are reviewed today.  The patient continues to complain of shortness of breath and fatigue. Symptoms are slowly progressive. He denies chest pain or pressure. He is short of breath with bending forward. He denies leg swelling, orthopnea, or PND. He denies cough, fever, or chills.  Past Medical History  Diagnosis Date  . COPD (chronic obstructive pulmonary disease) (HCC)   . Hyperlipidemia   . Morbid obesity (HCC)     weight 243 pounds, BMI 31.2kg/m2, BSA 2.36 square meters  . Obstructive sleep apnea     compliant with CPAP  . History of coronary artery disease     status post stenting of the marginal circumflex in 12/2003 and again in 2009  . Aortic stenosis     s/p AVR by Dr Laneta SimmersBartle     Past Surgical History  Procedure Laterality Date  . Appendectomy    . Replacement total knee bilateral      2006  . Carotid endarterectomy      Dr Lollie Sailsale Williams  . Permanent pacemaker      MDT EnRhythm implanted by Dr Lawanda CousinsAl-Kori in [place] for complete heart block with syncope  . Aortic valve replacement (avr)/coronary artery bypass grafting (cabg)   08/05/2011    LIMA to LAD, sequential saphenous vein graft to third and fourth obtuse marginal branches of the circumflex, aortic valve replacement using a 23 mm Edwards pericardial valve  . Tee without cardioversion N/A 11/14/2014    Procedure: TRANSESOPHAGEAL ECHOCARDIOGRAM (TEE);  Surgeon: Donato SchultzMark Skains, MD;  [facility];  Service: Cardiovascular;  Laterality: N/A;  . Cardioversion N/A 11/14/2014    Procedure: CARDIOVERSION;  Surgeon: Donato SchultzMark Skains, MD;  [facility];  Service: Cardiovascular;  Laterality: N/A;    Current Outpatient Prescriptions  Medication Sig Dispense Refill  . aspirin 81 MG tablet Take 81 mg by mouth daily.    Marland Kitchen. atorvastatin (LIPITOR) 80 MG tablet Take 1 tablet (80 mg total) by mouth daily. 90 tablet 3  . escitalopram (LEXAPRO) 10 MG tablet Take 10 mg by mouth at bedtime.    . furosemide (LASIX) 20 MG tablet Take 1 tablet (20 mg total) by mouth daily. 30 tablet 0  . metoprolol succinate (TOPROL-XL) 25 MG 24 hr tablet Take 25 mg by mouth at bedtime.   6  .  Multiple Vitamin (MULTIVITAMIN) tablet Take 1 tablet by mouth daily.    . potassium chloride (K-DUR) 10 MEQ tablet Take 1 tablet (10 mEq total) by mouth daily. 90 tablet 3  . sotalol (BETAPACE) 80 MG tablet Take 1 tablet (80 mg total) by mouth 2 (two) times daily. 60 tablet 6  . warfarin (COUMADIN) 2.5 MG tablet Take as directed by the coumadin clinic    . zolpidem (AMBIEN) 10 MG tablet Take 5-10 mg by mouth at bedtime.      No current facility-administered medications for this visit.    Allergies:   Review of patient's allergies indicates no  known allergies.   Social History:  The patient  reports that he quit smoking about 11 years ago. His smoking use included Cigarettes. He started smoking about 41 years ago. He has a 15 pack-year smoking history. He does not have any smokeless tobacco history on file. He reports that he drinks alcohol. He reports that he does not use illicit drugs.   Family History:  The patient's  family history includes Alzheimer's disease in his mother; Other in his father; Tuberculosis in his father.    ROS:  Please see the history of present illness.  Otherwise, review of systems is positive for easy bruising, excessive fatigue, constipation, balance problems.  All other systems are reviewed and negative.    PHYSICAL EXAM: VS:  BP 104/50 mmHg  Pulse 64  Ht  (1.88 m)  Wt 230 lb 1.9 oz (104.382 kg)  BMI 29.53 kg/m2 , BMI Body mass index is 29.53 kg/(m^2). GEN: Well nourished, well developed, pleasant elderly male in no acute distress HEENT: normal Neck: no JVD, no masses. No carotid bruits Cardiac: RRR without murmur or gallop                Respiratory:  clear to auscultation bilaterally, normal work of breathing GI: soft, nontender, nondistended, + BS MS: no deformity or atrophy Ext: no pretibial edema, pedal pulses 2+= bilaterally Skin: warm and dry, no rash Neuro:  Strength and sensation are intact Psych: euthymic mood, full affect  Recent Labs: 10/31/2014: ALT 22 11/09/2014: Hemoglobin 13.8; Platelets 154.0 01/04/2015: Pro B Natriuretic peptide (BNP) 205.0* 04/12/2015: BUN 26*; Creatinine, Ser 0.93; Magnesium 1.9; Potassium 4.2; Sodium 136   Lipid Panel  No results found for: CHOL, TRIG, HDL, CHOLHDL, VLDL, LDLCALC, LDLDIRECT    Wt Readings from Last 3 Encounters:  08/20/15 230 lb 1.9 oz (104.382 kg)  04/12/15 222 lb 12.8 oz (101.061 kg)  01/16/15 226 lb 3.2 oz (102.604 kg)     Cardiac Studies Reviewed: 2-D echocardiogram 11/06/2014: Study Conclusions  - Left ventricle: The  cavity size was normal. Wall thickness was increased in a pattern of mild LVH. Septal-lateral dyssynchrony likely from RV pacing. The estimated ejection fraction was 55%. Indeterminant diastolic function. - Aortic valve: Bioprosthetic aortic valve, poorly visualized. No significant stenosis by mean gradient, no significant regurgitation. Mean gradient (S): 11 mm Hg. - Mitral valve: Moderately calcified annulus. Mildly calcified leaflets . There was trivial regurgitation. - Left atrium: The atrium was mildly dilated. - Right ventricle: The cavity size was normal. Pacer wire or catheter noted in right ventricle. Systolic function was normal. - Right atrium: The atrium was mildly dilated. - Tricuspid valve: Peak RV-RA gradient (S): 28 mm Hg. - Pulmonary arteries: PA peak pressure: 36 mm Hg (S). - Systemic veins: IVC measured 2.0 cm with < 50% respirophasic variation, suggesting RA pressure 8 mmHg.  Impressions:  -  Normal LV size with mild LV hypertrophy. EF 55% with septal-lateral dyssynchrony from RV pacing. Normal RV size and systolic function. Bioprosthetic aortic valve was poorly visualized but appeared to function normally. Borderline pulmonary hypertension.   ASSESSMENT AND PLAN: 1.  Chronic diastolic heart failure, New York Heart Association functional class II: The patient appears stable. However, he does report progressive fatigue. He has had fatigue related to beta blockade in the past. He is taking sotalol for rhythm control and metoprolol succinate. Will wean him off of metoprolol succinate to see if this improves his symptoms. No other changes made in his medical regimen today. I do not appreciate any evidence of volume overload on his physical exam.  2. Paroxysmal atrial fibrillation: The patient is tolerating warfarin. He remains on sotalol for rhythm control. His most recent device check was reviewed and he appears to be maintaining sinus  rhythm.  3. Aortic valve disease status post bioprosthetic aortic valve replacement: Postoperative echo studies have shown normal function of his bioprosthetic aortic valve.  4. Essential hypertension: Blood pressure is controlled, and in fact is running low at present. Will wean him off of metoprolol succinate as outlined.  5. Chronic anticoagulation: Tolerating warfarin without bleeding problems in the presence of valvular heart disease and paroxysmal atrial fibrillation.  Current medicines are reviewed with the patient today.  The patient does not have concerns regarding medicines.  Labs/ tests ordered today include:  No orders of the defined types were placed in this encounter.   Disposition:   FU 6 months  Signed, Tonny Bollman, MD  08/20/2015 9:36 AM    [facility] [address] [place], [place], [place]  [address] Phone: [phone]; Fax: [phone]

## 2015-08-20 NOTE — Patient Instructions (Signed)
Medication Instructions:  Your physician has recommended you make the following change in your medication:  1. DECREASE Metoprolol Succinate to one-half tablet by mouth daily for 1 WEEK and then STOP medication  Labwork: No new orders.   Testing/Procedures: No new orders.   Follow-Up: Your physician wants you to follow-up in: 6 MONTHS with Dr Excell Seltzerooper.  You will receive a reminder letter in the mail two months in advance. If you don't receive a letter, please call our office to schedule the follow-up appointment.   Any Other Special Instructions Will Be Listed Below (If Applicable).

## 2015-08-22 ENCOUNTER — Encounter: Payer: Self-pay | Admitting: Cardiovascular Disease

## 2015-08-23 ENCOUNTER — Other Ambulatory Visit: Payer: Self-pay | Admitting: Cardiovascular Disease

## 2015-08-27 ENCOUNTER — Ambulatory Visit (INDEPENDENT_AMBULATORY_CARE_PROVIDER_SITE_OTHER): Payer: Medicare Other | Admitting: Pharmacist

## 2015-08-27 DIAGNOSIS — I350 Nonrheumatic aortic (valve) stenosis: Secondary | ICD-10-CM

## 2015-08-27 DIAGNOSIS — R0602 Shortness of breath: Secondary | ICD-10-CM

## 2015-08-27 DIAGNOSIS — I4891 Unspecified atrial fibrillation: Secondary | ICD-10-CM | POA: Diagnosis not present

## 2015-08-27 DIAGNOSIS — I481 Persistent atrial fibrillation: Secondary | ICD-10-CM | POA: Diagnosis not present

## 2015-08-27 DIAGNOSIS — I4819 Other persistent atrial fibrillation: Secondary | ICD-10-CM

## 2015-08-27 LAB — POCT INR: INR: 2.5

## 2015-09-13 ENCOUNTER — Telehealth: Payer: Self-pay | Admitting: Cardiology

## 2015-09-13 ENCOUNTER — Ambulatory Visit (INDEPENDENT_AMBULATORY_CARE_PROVIDER_SITE_OTHER): Payer: Medicare Other | Admitting: *Deleted

## 2015-09-13 DIAGNOSIS — Z950 Presence of cardiac pacemaker: Secondary | ICD-10-CM

## 2015-09-13 NOTE — Telephone Encounter (Signed)
Confirmed remote transmission w/ pt wife.   

## 2015-09-13 NOTE — Progress Notes (Signed)
Remote pacemaker transmission.   

## 2015-09-19 LAB — CUP PACEART REMOTE DEVICE CHECK
Battery Voltage: 2.82 V
Brady Statistic AP VP Percent: 0.13 %
Brady Statistic AS VS Percent: 50.18 %
Brady Statistic RV Percent Paced: 0.16 %
Implantable Lead Location: 753859
Implantable Lead Model: 5076
Implantable Lead Model: 5594
Lead Channel Impedance Value: 352 Ohm
Lead Channel Sensing Intrinsic Amplitude: 0.693 mV
Lead Channel Setting Pacing Amplitude: 2.5 V
Lead Channel Setting Pacing Pulse Width: 1 ms
MDC IDC LEAD IMPLANT DT: 20061113
MDC IDC LEAD IMPLANT DT: 20061113
MDC IDC LEAD LOCATION: 753860
MDC IDC MSMT LEADCHNL RV IMPEDANCE VALUE: 1504 Ohm
MDC IDC MSMT LEADCHNL RV SENSING INTR AMPL: 4.435 mV
MDC IDC SESS DTM: 20161117190933
MDC IDC SET LEADCHNL RV PACING AMPLITUDE: 5 V
MDC IDC SET LEADCHNL RV SENSING SENSITIVITY: 0.9 mV
MDC IDC STAT BRADY AP VS PERCENT: 49.66 %
MDC IDC STAT BRADY AS VP PERCENT: 0.03 %
MDC IDC STAT BRADY RA PERCENT PACED: 49.79 %

## 2015-09-24 ENCOUNTER — Ambulatory Visit (INDEPENDENT_AMBULATORY_CARE_PROVIDER_SITE_OTHER): Payer: Medicare Other

## 2015-09-24 DIAGNOSIS — I481 Persistent atrial fibrillation: Secondary | ICD-10-CM

## 2015-09-24 DIAGNOSIS — I4891 Unspecified atrial fibrillation: Secondary | ICD-10-CM

## 2015-09-24 DIAGNOSIS — I4819 Other persistent atrial fibrillation: Secondary | ICD-10-CM

## 2015-09-24 DIAGNOSIS — R0602 Shortness of breath: Secondary | ICD-10-CM | POA: Diagnosis not present

## 2015-09-24 DIAGNOSIS — I350 Nonrheumatic aortic (valve) stenosis: Secondary | ICD-10-CM | POA: Diagnosis not present

## 2015-09-24 LAB — POCT INR: INR: 4.7

## 2015-09-26 ENCOUNTER — Encounter: Payer: Self-pay | Admitting: Cardiology

## 2015-10-08 ENCOUNTER — Ambulatory Visit (INDEPENDENT_AMBULATORY_CARE_PROVIDER_SITE_OTHER): Payer: Medicare Other | Admitting: *Deleted

## 2015-10-08 DIAGNOSIS — R0602 Shortness of breath: Secondary | ICD-10-CM

## 2015-10-08 DIAGNOSIS — I4891 Unspecified atrial fibrillation: Secondary | ICD-10-CM | POA: Diagnosis not present

## 2015-10-08 DIAGNOSIS — I350 Nonrheumatic aortic (valve) stenosis: Secondary | ICD-10-CM

## 2015-10-08 DIAGNOSIS — I4819 Other persistent atrial fibrillation: Secondary | ICD-10-CM

## 2015-10-08 DIAGNOSIS — I481 Persistent atrial fibrillation: Secondary | ICD-10-CM

## 2015-10-08 LAB — POCT INR: INR: 2.2

## 2015-10-15 ENCOUNTER — Ambulatory Visit (INDEPENDENT_AMBULATORY_CARE_PROVIDER_SITE_OTHER): Payer: Medicare Other | Admitting: *Deleted

## 2015-10-15 ENCOUNTER — Telehealth: Payer: Self-pay | Admitting: Cardiology

## 2015-10-15 DIAGNOSIS — I480 Paroxysmal atrial fibrillation: Secondary | ICD-10-CM | POA: Diagnosis not present

## 2015-10-15 NOTE — Telephone Encounter (Signed)
Spoke with pt and reminded pt of remote transmission that is due today. Pt verbalized understanding.   

## 2015-10-15 NOTE — Telephone Encounter (Signed)
LMOVM for pt to return call 

## 2015-10-16 NOTE — Progress Notes (Signed)
Remote pacemaker transmission.   

## 2015-10-16 NOTE — Telephone Encounter (Signed)
Spoke w/ pt wife and informed her that pt implanted device has reached ERI and that he has about 3 months left on the battery. He may or may not here the alert tone and if he wants it turned off he can come into the office sooner and have it turned off. Informed pt wife that a scheduler will be calling to schedule an appt either w/ MD, NP, or PA. She verbalized understanding.

## 2015-10-18 ENCOUNTER — Encounter: Payer: Self-pay | Admitting: Cardiology

## 2015-10-18 LAB — CUP PACEART REMOTE DEVICE CHECK
Brady Statistic AP VS Percent: 30.34 %
Brady Statistic AS VP Percent: 12.99 %
Brady Statistic AS VS Percent: 56.53 %
Date Time Interrogation Session: 20161219184629
Implantable Lead Implant Date: 20061113
Implantable Lead Location: 753859
Implantable Lead Location: 753860
Implantable Lead Model: 5594
Lead Channel Impedance Value: 1536 Ohm
Lead Channel Impedance Value: 344 Ohm
Lead Channel Sensing Intrinsic Amplitude: 0.693 mV
Lead Channel Sensing Intrinsic Amplitude: 5.799 mV
Lead Channel Setting Pacing Amplitude: 5 V
MDC IDC LEAD IMPLANT DT: 20061113
MDC IDC MSMT BATTERY VOLTAGE: 2.81 V
MDC IDC SET LEADCHNL RV PACING PULSEWIDTH: 1 ms
MDC IDC SET LEADCHNL RV SENSING SENSITIVITY: 0.9 mV
MDC IDC STAT BRADY AP VP PERCENT: 0.14 %
MDC IDC STAT BRADY RA PERCENT PACED: 30.48 %
MDC IDC STAT BRADY RV PERCENT PACED: 13.13 %

## 2015-10-26 ENCOUNTER — Encounter: Payer: Self-pay | Admitting: *Deleted

## 2015-10-26 ENCOUNTER — Ambulatory Visit (INDEPENDENT_AMBULATORY_CARE_PROVIDER_SITE_OTHER): Payer: Medicare Other | Admitting: Physician Assistant

## 2015-10-26 ENCOUNTER — Encounter: Payer: Self-pay | Admitting: Physician Assistant

## 2015-10-26 ENCOUNTER — Encounter: Payer: Self-pay | Admitting: Internal Medicine

## 2015-10-26 VITALS — BP 114/68 | HR 65 | Ht 74.0 in | Wt 237.0 lb

## 2015-10-26 DIAGNOSIS — I480 Paroxysmal atrial fibrillation: Secondary | ICD-10-CM

## 2015-10-26 DIAGNOSIS — I10 Essential (primary) hypertension: Secondary | ICD-10-CM

## 2015-10-26 DIAGNOSIS — I739 Peripheral vascular disease, unspecified: Secondary | ICD-10-CM

## 2015-10-26 DIAGNOSIS — I4819 Other persistent atrial fibrillation: Secondary | ICD-10-CM

## 2015-10-26 DIAGNOSIS — I350 Nonrheumatic aortic (valve) stenosis: Secondary | ICD-10-CM | POA: Diagnosis not present

## 2015-10-26 DIAGNOSIS — I481 Persistent atrial fibrillation: Secondary | ICD-10-CM | POA: Diagnosis not present

## 2015-10-26 DIAGNOSIS — R001 Bradycardia, unspecified: Secondary | ICD-10-CM | POA: Diagnosis not present

## 2015-10-26 NOTE — Progress Notes (Signed)
Cardiology Office Note Date:  10/26/2015  Patient ID:  Alex [name], Alex [name] 1931/09/09, MRN [id number] PCP:  [name], MD  Cardiologist:  Dr. Excell Seltzer Electrophysiologist: Dr. Johney Frame   Chief Complaint: Device has reached ERI  History of Present Illness: Alex [name] is a 81 y.o. male with history of COPD, OSA/CPAP, morbid obesity, CAD (last PCI intervention 2009, CABG 2012, cath 2014 without intervention), AVR (bioprosthetic) 2012, PAFib (DCCV Jan 2016), on coumadin monitored and managed with the coumadin clinic, PVD with R carotid endarterectomy.  He comes into the office today feeling very well.  He saw Dr. Excell Seltzer in October with some exertional c/o, felt possibly secondary to his BB which was planned to wean off and remain on sotalol.  The patient has actually remained on 1/2 tab daily because he still had some and says he feels great.  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 He denies any kind of CP, no palpitations, no dizziness, near syncope or syncope.  He does have some degree of baseline DOE that he attributes to his COPD and at his baseline for a few years.  He denies bleeding or signs of bleeding with his coumadin.   Past Medical History  Diagnosis Date  . COPD (chronic obstructive pulmonary disease) (HCC)   . Hyperlipidemia   . Morbid obesity (HCC)     weight 243 pounds, BMI 31.2kg/m2, BSA 2.36 square meters  . Obstructive sleep apnea     compliant with CPAP  . History of coronary artery disease     status post stenting of the marginal circumflex in 12/2003 and again in 2009  . Aortic stenosis     s/p AVR by Dr Laneta Simmers    Past Surgical History  Procedure Laterality Date  . Appendectomy    . Replacement total knee bilateral      2006  . Carotid endarterectomy      Dr Lollie Sails  . Permanent pacemaker      MDT EnRhythm implanted by Dr  Lawanda Cousins in [place] for complete heart block with syncope  . Aortic valve replacement (avr)/coronary artery bypass grafting (cabg)   08/05/2011    LIMA to LAD, sequential saphenous vein graft to third and fourth obtuse marginal branches of the circumflex, aortic valve replacement using a 23 mm Edwards pericardial valve  . Tee without cardioversion N/A 11/14/2014    Procedure: TRANSESOPHAGEAL ECHOCARDIOGRAM (TEE);  Surgeon: Donato Schultz, MD;  Location: [facility] ENDOSCOPY;  Service: Cardiovascular;  Laterality: N/A;  . Cardioversion N/A 11/14/2014    Procedure: CARDIOVERSION;  Surgeon: Donato Schultz, MD;  Location: [facility] ENDOSCOPY;  Service: Cardiovascular;  Laterality: N/A;    Current Outpatient Prescriptions  Medication Sig Dispense Refill  . aspirin 81 MG tablet Take 81 mg by mouth daily.    Marland Kitchen atorvastatin (LIPITOR) 80 MG tablet Take 1 tablet (80 mg total) by mouth daily. 90 tablet 3  . B Complex Vitamins (VITAMIN B COMPLEX PO) Take by mouth.    . escitalopram (LEXAPRO) 10 MG tablet Take 10 mg by mouth at bedtime.    . furosemide (LASIX) 20 MG tablet Take 1 tablet (20 mg total) by mouth daily. 30 tablet 0  . metoprolol succinate (TOPROL-XL) 25 MG 24 hr tablet Take 25 mg by mouth daily. TAKE HALF 12.5 MG ONCE A DAY    . potassium chloride (K-DUR) 10 MEQ tablet  Take 1 tablet (10 mEq total) by mouth daily. 90 tablet 3  . sotalol (BETAPACE) 80 MG tablet Take 1 tablet (80 mg total) by mouth 2 (two) times daily. 60 tablet 6  . warfarin (COUMADIN) 2.5 MG tablet TAKE 1 TABLET BY MOUTH DAILY 120 tablet 0  . zolpidem (AMBIEN) 10 MG tablet Take 5-10 mg by mouth at bedtime.      No current facility-administered medications for this visit.    Allergies:   Review of patient's allergies indicates no known allergies.   Social History:  The patient  reports that he quit smoking about 12 years ago. His smoking use included Cigarettes. He started smoking about 42 years ago. He has a 15 pack-year smoking history. He  does not have any smokeless tobacco history on file. He reports that he drinks alcohol. He reports that he does not use illicit drugs.   Family History:  The patient's family history includes Alzheimer's disease in his mother; Other in his father; Tuberculosis in his father.  ROS:  Please see the history of present illness.    All other systems are reviewed and otherwise negative.   PHYSICAL EXAM:  VS:  BP 114/68 mmHg  Pulse 65  Ht 6\' 2"  (1.88 m)  Wt 237 lb (107.502 kg)  BMI 30.42 kg/m2 BMI: Body mass index is 30.42 kg/(m^2). Very pleasant obese WM, in no acute distress HEENT: normocephalic, atraumatic Neck: no JVD, carotid bruits or masses Cardiac:  normal S1, S2; RRR; no significant murmurs, no rubs, or gallops Lungs:  clear to auscultation bilaterally, no wheezing, rhonchi or rales Abd: soft, nontender MS: no deformity or atrophy Ext: no edema Skin: warm and dry, no rash Neuro:  No gross deficits appreciated Psych: euthymic mood, full affect  PPM site is stable, no tethering or discomfort   EKG:  Done today shows V paced, after reprogramming is A [aced, V sensed, QTc appears stable PPM check today: PPM reached ERI 10/09/15, VVI was reprogrammed back to MVP, RV lead with elevated threshold chronically with a slow increase over time. Notes reflect chronically elevated RV thresholds efforts to avoid lead revision given advanced age  44/19/16 TEE Study Conclusions - Left ventricle: The cavity size was normal. Wall thickness was normal. Systolic function was normal. The estimated ejection fraction was in the range of 55% to 60%. No evidence of thrombus. - Aortic valve: A bioprosthesis was present and functioning normally. No evidence of vegetation. There was trivial regurgitation. - Mitral valve: No evidence of vegetation. There was mild regurgitation. - Left atrium: No evidence of thrombus in the atrial cavity or appendage. No evidence of thrombus in the  appendage. - Right atrium: No evidence of thrombus in the atrial cavity or appendage. - Atrial septum: No defect or patent foramen ovale was identified. - Tricuspid valve: No evidence of vegetation. - Pulmonic valve: No evidence of vegetation. Impressions: - Successful cardioversion. No cardiac source of emboli was indentified.  03/22/13: [facility] Final Conclusions:  1. Severe native two-vessel coronary artery disease with severe stenosis of the proximal LAD and total occlusion left circumflex 2. Mild to moderate proximal RCA stenosis 3. Status post aortocoronary bypass surgery with continued patency of the LIMA to LAD and saphenous vein graft sequential to OM 3 and OM 4   Recent Labs: 10/31/2014: ALT 22 11/09/2014: Hemoglobin 13.8; Platelets 154.0 01/04/2015: Pro B Natriuretic peptide (BNP) 205.0* 04/12/2015: BUN 26*; Creatinine, Ser 0.93; Magnesium 1.9; Potassium 4.2; Sodium 136   10/08/15 INR 2.2  CrCl  cannot be calculated (Patient has no serum creatinine result on file.).   Wt Readings from Last 3 Encounters:  10/26/15 237 lb (107.502 kg)  08/20/15 230 lb 1.9 oz (104.382 kg)  04/12/15 222 lb 12.8 oz (101.061 kg)     Other studies reviewed: Additional studies/records reviewed today include: summarized above  DEVICE information: MDT enrhythm PPM implanted by Dr. Johney Frame 09/08/2005 Last EP nots reflects chronically elevated RV thresholds efforts to avoid lead revision given advanced age  ASSESSMENT AND PLAN:  1. PAFib, Avblock with PPM CHADS2Vasc is at least 4 on coumadin (given VHD) no bleeding or signs of bleeding reported by the patient Device has reached ERI, arrange generator change, discussed his lead, and possibility of lead revision if Dr. Johney Frame felt indicated at the time of gen change, discussed risk/benefit and he is agreeable to proceed. Hold coumadin pending his INR pre-procedure Sotalol, last Creat OK, QTc is stable Continue Merlin remotes Q 3 months  2.  CAD no CP On ASA, statin  3. VHD/AVR (bioprosthetic) Echo as above  4. HTN Appears well controlled  5. PVD, hx of right CEA asymptomatic Will defer to his PMD/primary cardiology, he does follow with vascular   Disposition: F/u with wound check post procedure, 56month visit with Dr. Johney Frame. Continue Carelink remotes Q 3 months post generator change.  Current medicines are reviewed at length with the patient today.  The patient did not have any concerns regarding medicines.  Judith Blonder, PA-C 10/26/2015 3:27 PM     [facility] [address] [place] [address] [phone] (office)  [phone] (fax)

## 2015-10-26 NOTE — Patient Instructions (Addendum)
Medication Instructions:   Your physician recommends that you continue on your current medications as directed. Please refer to the Current Medication list given to you today.   If you need a refill on your cardiac medications before your next appointment, please call your pharmacy.  Labwork:  RETURN ON 11/01/15 FOR BMET PT/INR  CBC    Testing/Procedures:  SEE LETTER ON PPM GEN CHANGE 11/06/15   Follow-Up:    10 DAY WOUND CHECK AFTER : 11/06/15  31 DAY PHYS PACER CK  AFTER: 11/06/15    Any Other Special Instructions Will Be Listed Below (If Applicable).

## 2015-10-29 ENCOUNTER — Other Ambulatory Visit: Payer: Self-pay | Admitting: Physician Assistant

## 2015-11-01 ENCOUNTER — Other Ambulatory Visit (INDEPENDENT_AMBULATORY_CARE_PROVIDER_SITE_OTHER): Payer: Medicare Other

## 2015-11-01 DIAGNOSIS — R001 Bradycardia, unspecified: Secondary | ICD-10-CM | POA: Diagnosis not present

## 2015-11-01 LAB — BASIC METABOLIC PANEL
BUN: 28 mg/dL — ABNORMAL HIGH (ref 7–25)
CALCIUM: 9 mg/dL (ref 8.6–10.3)
CO2: 30 mmol/L (ref 20–31)
Chloride: 103 mmol/L (ref 98–110)
Creat: 0.92 mg/dL (ref 0.70–1.11)
Glucose, Bld: 88 mg/dL (ref 65–99)
Potassium: 4.4 mmol/L (ref 3.5–5.3)
SODIUM: 141 mmol/L (ref 135–146)

## 2015-11-01 LAB — CBC
HEMATOCRIT: 40.3 % (ref 39.0–52.0)
Hemoglobin: 13.4 g/dL (ref 13.0–17.0)
MCH: 33.3 pg (ref 26.0–34.0)
MCHC: 33.3 g/dL (ref 30.0–36.0)
MCV: 100 fL (ref 78.0–100.0)
MPV: 10.6 fL (ref 8.6–12.4)
Platelets: 152 10*3/uL (ref 150–400)
RBC: 4.03 MIL/uL — ABNORMAL LOW (ref 4.22–5.81)
RDW: 13.5 % (ref 11.5–15.5)
WBC: 5.3 10*3/uL (ref 4.0–10.5)

## 2015-11-01 LAB — PROTIME-INR
INR: 2.35 — AB (ref <1.50)
Prothrombin Time: 26.1 seconds — ABNORMAL HIGH (ref 11.6–15.2)

## 2015-11-01 NOTE — Addendum Note (Signed)
Addended by: BOWDEN, ROBIN K on: 11/01/2015 07:58 AM   Modules accepted: Orders  

## 2015-11-02 ENCOUNTER — Telehealth: Payer: Self-pay | Admitting: *Deleted

## 2015-11-02 NOTE — Telephone Encounter (Signed)
-----   Message from Renee Lynn Ursuy, PA-C sent at 11/02/2015 11:16 AM EST ----- Please let the patient know that his labs look good for his procedure next week.  Dr. Allred would like him to hold his coumadin for 3 days prior to the procedure.  Thanks, Renee Ursuy, PA-C 

## 2015-11-02 NOTE — Telephone Encounter (Signed)
-----   Message from [facility] Lynn Ursuy, [place]-C sent at 11/02/2015 11:16 AM EST ----- Please let the patient know that his labs look good for his procedure next week.  Dr. Johney FrameAllred would like him to hold his coumadin for 3 days prior to the procedure.  Thanks, Francis Dowseenee Ursuy, PA-C

## 2015-11-06 ENCOUNTER — Encounter (HOSPITAL_COMMUNITY)
Admission: RE | Disposition: A | Discharge status: Home / Self Care | Payer: Self-pay | Source: Ambulatory Visit | Attending: Internal Medicine

## 2015-11-06 ENCOUNTER — Ambulatory Visit (HOSPITAL_COMMUNITY)
Admission: RE | Admit: 2015-11-06 | Discharge: 2015-11-06 | Disposition: A | Discharge status: Home / Self Care | Payer: Medicare Other | Source: Ambulatory Visit | Attending: Internal Medicine | Admitting: Internal Medicine

## 2015-11-06 DIAGNOSIS — I442 Atrioventricular block, complete: Secondary | ICD-10-CM | POA: Insufficient documentation

## 2015-11-06 DIAGNOSIS — I739 Peripheral vascular disease, unspecified: Secondary | ICD-10-CM | POA: Diagnosis not present

## 2015-11-06 DIAGNOSIS — J449 Chronic obstructive pulmonary disease, unspecified: Secondary | ICD-10-CM | POA: Diagnosis not present

## 2015-11-06 DIAGNOSIS — Z7901 Long term (current) use of anticoagulants: Secondary | ICD-10-CM | POA: Diagnosis not present

## 2015-11-06 DIAGNOSIS — Z952 Presence of prosthetic heart valve: Secondary | ICD-10-CM | POA: Diagnosis not present

## 2015-11-06 DIAGNOSIS — I10 Essential (primary) hypertension: Secondary | ICD-10-CM | POA: Insufficient documentation

## 2015-11-06 DIAGNOSIS — I350 Nonrheumatic aortic (valve) stenosis: Secondary | ICD-10-CM | POA: Diagnosis not present

## 2015-11-06 DIAGNOSIS — Z87891 Personal history of nicotine dependence: Secondary | ICD-10-CM | POA: Diagnosis not present

## 2015-11-06 DIAGNOSIS — I495 Sick sinus syndrome: Secondary | ICD-10-CM

## 2015-11-06 DIAGNOSIS — Z7982 Long term (current) use of aspirin: Secondary | ICD-10-CM | POA: Diagnosis not present

## 2015-11-06 DIAGNOSIS — Z951 Presence of aortocoronary bypass graft: Secondary | ICD-10-CM | POA: Insufficient documentation

## 2015-11-06 DIAGNOSIS — Z6830 Body mass index (BMI) 30.0-30.9, adult: Secondary | ICD-10-CM | POA: Insufficient documentation

## 2015-11-06 DIAGNOSIS — G4733 Obstructive sleep apnea (adult) (pediatric): Secondary | ICD-10-CM | POA: Insufficient documentation

## 2015-11-06 DIAGNOSIS — E785 Hyperlipidemia, unspecified: Secondary | ICD-10-CM | POA: Insufficient documentation

## 2015-11-06 DIAGNOSIS — I2510 Atherosclerotic heart disease of native coronary artery without angina pectoris: Secondary | ICD-10-CM | POA: Insufficient documentation

## 2015-11-06 DIAGNOSIS — Z45010 Encounter for checking and testing of cardiac pacemaker pulse generator [battery]: Secondary | ICD-10-CM | POA: Diagnosis not present

## 2015-11-06 HISTORY — PX: EP IMPLANTABLE DEVICE: SHX172B

## 2015-11-06 LAB — PROTIME-INR
INR: 1.44 (ref 0.00–1.49)
Prothrombin Time: 17.6 seconds — ABNORMAL HIGH (ref 11.6–15.2)

## 2015-11-06 LAB — SURGICAL PCR SCREEN
MRSA, PCR: NEGATIVE
Staphylococcus aureus: NEGATIVE

## 2015-11-06 SURGERY — PPM/BIV PPM GENERATOR CHANGEOUT
Anesthesia: LOCAL

## 2015-11-06 MED ORDER — SODIUM CHLORIDE 0.9 % IV SOLN
INTRAVENOUS | Status: DC
  Administered 2015-11-06: 14:00:00 via INTRAVENOUS

## 2015-11-06 MED ORDER — SODIUM CHLORIDE 0.9 % IV SOLN: 250.0000 mL | INTRAVENOUS | Status: DC | PRN

## 2015-11-06 MED ORDER — CEFAZOLIN SODIUM-DEXTROSE 2-3 GM-% IV SOLR
2.0000 g | INTRAVENOUS | Status: AC
  Administered 2015-11-06: 2 g via INTRAVENOUS

## 2015-11-06 MED ORDER — SODIUM CHLORIDE 0.9 % IJ SOLN: 3.0000 mL | Freq: Two times a day (BID) | INTRAMUSCULAR | Status: DC

## 2015-11-06 MED ORDER — SODIUM CHLORIDE 0.9 % IJ SOLN: 3.0000 mL | INTRAMUSCULAR | Status: DC | PRN

## 2015-11-06 MED ORDER — CHLORHEXIDINE GLUCONATE 4 % EX LIQD: 60.0000 mL | Freq: Once | CUTANEOUS | Status: DC

## 2015-11-06 MED ORDER — MUPIROCIN 2 % EX OINT
1.0000 "application " | TOPICAL_OINTMENT | Freq: Once | CUTANEOUS | Status: AC
  Administered 2015-11-06: 1 via TOPICAL

## 2015-11-06 MED ORDER — MIDAZOLAM HCL 5 MG/5ML IJ SOLN
INTRAMUSCULAR | Status: DC | PRN
  Administered 2015-11-06: 1 mg via INTRAVENOUS

## 2015-11-06 MED ORDER — CEFAZOLIN SODIUM-DEXTROSE 2-3 GM-% IV SOLR
INTRAVENOUS | Status: AC
  Filled 2015-11-06: qty 50

## 2015-11-06 MED ORDER — MUPIROCIN 2 % EX OINT
TOPICAL_OINTMENT | CUTANEOUS | Status: AC
  Administered 2015-11-06: 1 via TOPICAL
  Filled 2015-11-06: qty 22

## 2015-11-06 MED ORDER — FENTANYL CITRATE (PF) 100 MCG/2ML IJ SOLN
INTRAMUSCULAR | Status: AC
  Filled 2015-11-06: qty 2

## 2015-11-06 MED ORDER — ACETAMINOPHEN 325 MG PO TABS: 325.0000 mg | ORAL_TABLET | ORAL | Status: DC | PRN

## 2015-11-06 MED ORDER — LIDOCAINE HCL (PF) 1 % IJ SOLN
INTRAMUSCULAR | Status: AC
  Filled 2015-11-06: qty 60

## 2015-11-06 MED ORDER — SODIUM CHLORIDE 0.9 % IR SOLN
80.0000 mg | Status: AC
  Administered 2015-11-06: 80 mg
  Filled 2015-11-06: qty 2

## 2015-11-06 MED ORDER — MIDAZOLAM HCL 5 MG/5ML IJ SOLN
INTRAMUSCULAR | Status: AC
  Filled 2015-11-06: qty 5

## 2015-11-06 MED ORDER — ONDANSETRON HCL 4 MG/2ML IJ SOLN
4.0000 mg | Freq: Four times a day (QID) | INTRAMUSCULAR | Status: DC | PRN
Start: 2015-11-06 — End: 2015-11-06

## 2015-11-06 MED ORDER — LIDOCAINE HCL (PF) 1 % IJ SOLN
INTRAMUSCULAR | Status: DC | PRN
  Administered 2015-11-06: 60 mL

## 2015-11-06 MED ORDER — FENTANYL CITRATE (PF) 100 MCG/2ML IJ SOLN
INTRAMUSCULAR | Status: DC | PRN
  Administered 2015-11-06: 12.5 ug via INTRAVENOUS

## 2015-11-06 MED ORDER — SODIUM CHLORIDE 0.9 % IR SOLN
Status: AC
  Filled 2015-11-06: qty 2

## 2015-11-06 SURGICAL SUPPLY — 5 items
CABLE SURGICAL S-101-97-12 (CABLE) ×2 IMPLANT
PACEMAKER ADAPTA DR ADDRL1 (Pacemaker) ×1 IMPLANT
PAD DEFIB LIFELINK (PAD) ×2 IMPLANT
PPM ADAPTA DR ADDRL1 (Pacemaker) ×2 IMPLANT
TRAY PACEMAKER INSERTION (PACKS) ×2 IMPLANT

## 2015-11-06 NOTE — Discharge Instructions (Signed)
Keep incision clean and dry for 10 days. °No driving for 2 days.  °You can remove outer dressing tomorrow. °Leave steri-strips (little pieces of tape) on until seen in the office for wound check appointment. °Call the office ([phone]) for redness, drainage, swelling, or fever. ° °Pacemaker Battery Change, Care After °Refer to this sheet in the next few weeks. These instructions provide you with information on caring for yourself after your procedure. Your health care provider may also give you more specific instructions. Your treatment has been planned according to current medical practices, but problems sometimes occur. Call your health care provider if you have any problems or questions after your procedure. °WHAT TO EXPECT AFTER THE PROCEDURE °After your procedure, it is typical to have the following sensations: °· Soreness at the pacemaker site. °HOME CARE INSTRUCTIONS  °· Keep the incision clean and dry. °· Unless advised otherwise, you may shower beginning 48 hours after your procedure. °· For the first week after the replacement, avoid stretching motions that pull at the incision site, and avoid heavy exercise with the arm that is on the same side as the incision. °· Take medicines only as directed by your health care provider. °· Keep all follow-up visits as directed by your health care provider. °SEEK MEDICAL CARE IF:  °· You have pain at the incision site that is not relieved by over-the-counter or prescription medicine. °· There is drainage or pus from the incision site. °· There is swelling larger than a lime at the incision site. °· You develop red streaking that extends above or below the incision site. °· You feel brief, intermittent palpitations, light-headedness, or any symptoms that you feel might be related to your heart. °SEEK IMMEDIATE MEDICAL CARE IF:  °· You experience chest pain that is different than the pain at the pacemaker site. °· You experience shortness of breath. °· You have  palpitations or irregular heartbeat. °· You have light-headedness that does not go away quickly. °· You faint. °· You have pain that gets worse and is not relieved by medicine. °  °This information is not intended to replace advice given to you by your health care provider. Make sure you discuss any questions you have with your health care provider. °  °Document Released: 08/03/2013 Document Revised: 11/03/2014 Document Reviewed: 08/03/2013 °Elsevier Interactive Patient Education ©2016 Elsevier Inc[employer] ° °

## 2015-11-06 NOTE — H&P (View-Only) (Signed)
Cardiology Office Note Date:  10/26/2015  Patient ID:  Alex [name], Alex [name] 1931/09/09, MRN [id number] PCP:  [name], MD  Cardiologist:  Dr. Excell Seltzer Electrophysiologist: Dr. Johney Frame   Chief Complaint: Device has reached ERI  History of Present Illness: TRAJAN GROVE is a 82 y.o. male with history of COPD, OSA/CPAP, morbid obesity, CAD (last PCI intervention 2009, CABG 2012, cath 2014 without intervention), AVR (bioprosthetic) 2012, PAFib (DCCV Jan 2016), on coumadin monitored and managed with the coumadin clinic, PVD with R carotid endarterectomy.  He comes into the office today feeling very well.  He saw Dr. Excell Seltzer in October with some exertional c/o, felt possibly secondary to his BB which was planned to wean off and remain on sotalol.  The patient has actually remained on 1/2 tab daily because he still had some and says he feels great.  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 He denies any kind of CP, no palpitations, no dizziness, near syncope or syncope.  He does have some degree of baseline DOE that he attributes to his COPD and at his baseline for a few years.  He denies bleeding or signs of bleeding with his coumadin.   Past Medical History  Diagnosis Date  . COPD (chronic obstructive pulmonary disease) (HCC)   . Hyperlipidemia   . Morbid obesity (HCC)     weight 243 pounds, BMI 31.2kg/m2, BSA 2.36 square meters  . Obstructive sleep apnea     compliant with CPAP  . History of coronary artery disease     status post stenting of the marginal circumflex in 12/2003 and again in 2009  . Aortic stenosis     s/p AVR by Dr Laneta Simmers    Past Surgical History  Procedure Laterality Date  . Appendectomy    . Replacement total knee bilateral      2006  . Carotid endarterectomy      Dr Lollie Sails  . Permanent pacemaker      MDT EnRhythm implanted by Dr  Lawanda Cousins in [place] for complete heart block with syncope  . Aortic valve replacement (avr)/coronary artery bypass grafting (cabg)   08/05/2011    LIMA to LAD, sequential saphenous vein graft to third and fourth obtuse marginal branches of the circumflex, aortic valve replacement using a 23 mm Edwards pericardial valve  . Tee without cardioversion N/A 11/14/2014    Procedure: TRANSESOPHAGEAL ECHOCARDIOGRAM (TEE);  Surgeon: Donato Schultz, MD;  Location: [facility] ENDOSCOPY;  Service: Cardiovascular;  Laterality: N/A;  . Cardioversion N/A 11/14/2014    Procedure: CARDIOVERSION;  Surgeon: Donato Schultz, MD;  Location: [facility] ENDOSCOPY;  Service: Cardiovascular;  Laterality: N/A;    Current Outpatient Prescriptions  Medication Sig Dispense Refill  . aspirin 81 MG tablet Take 81 mg by mouth daily.    Marland Kitchen atorvastatin (LIPITOR) 80 MG tablet Take 1 tablet (80 mg total) by mouth daily. 90 tablet 3  . B Complex Vitamins (VITAMIN B COMPLEX PO) Take by mouth.    . escitalopram (LEXAPRO) 10 MG tablet Take 10 mg by mouth at bedtime.    . furosemide (LASIX) 20 MG tablet Take 1 tablet (20 mg total) by mouth daily. 30 tablet 0  . metoprolol succinate (TOPROL-XL) 25 MG 24 hr tablet Take 25 mg by mouth daily. TAKE HALF 12.5 MG ONCE A DAY    . potassium chloride (K-DUR) 10 MEQ tablet  Take 1 tablet (10 mEq total) by mouth daily. 90 tablet 3  . sotalol (BETAPACE) 80 MG tablet Take 1 tablet (80 mg total) by mouth 2 (two) times daily. 60 tablet 6  . warfarin (COUMADIN) 2.5 MG tablet TAKE 1 TABLET BY MOUTH DAILY 120 tablet 0  . zolpidem (AMBIEN) 10 MG tablet Take 5-10 mg by mouth at bedtime.      No current facility-administered medications for this visit.    Allergies:   Review of patient's allergies indicates no known allergies.   Social History:  The patient  reports that he quit smoking about 12 years ago. His smoking use included Cigarettes. He started smoking about 42 years ago. He has a 15 pack-year smoking history. He  does not have any smokeless tobacco history on file. He reports that he drinks alcohol. He reports that he does not use illicit drugs.   Family History:  The patient's family history includes Alzheimer's disease in his mother; Other in his father; Tuberculosis in his father.  ROS:  Please see the history of present illness.    All other systems are reviewed and otherwise negative.   PHYSICAL EXAM:  VS:  BP 114/68 mmHg  Pulse 65  Ht 6\' 2"  (1.88 m)  Wt 237 lb (107.502 kg)  BMI 30.42 kg/m2 BMI: Body mass index is 30.42 kg/(m^2). Very pleasant obese WM, in no acute distress HEENT: normocephalic, atraumatic Neck: no JVD, carotid bruits or masses Cardiac:  normal S1, S2; RRR; no significant murmurs, no rubs, or gallops Lungs:  clear to auscultation bilaterally, no wheezing, rhonchi or rales Abd: soft, nontender MS: no deformity or atrophy Ext: no edema Skin: warm and dry, no rash Neuro:  No gross deficits appreciated Psych: euthymic mood, full affect  PPM site is stable, no tethering or discomfort   EKG:  Done today shows V paced, after reprogramming is A [aced, V sensed, QTc appears stable PPM check today: PPM reached ERI 10/09/15, VVI was reprogrammed back to MVP, RV lead with elevated threshold chronically with a slow increase over time. Notes reflect chronically elevated RV thresholds efforts to avoid lead revision given advanced age  44/19/16 TEE Study Conclusions - Left ventricle: The cavity size was normal. Wall thickness was normal. Systolic function was normal. The estimated ejection fraction was in the range of 55% to 60%. No evidence of thrombus. - Aortic valve: A bioprosthesis was present and functioning normally. No evidence of vegetation. There was trivial regurgitation. - Mitral valve: No evidence of vegetation. There was mild regurgitation. - Left atrium: No evidence of thrombus in the atrial cavity or appendage. No evidence of thrombus in the  appendage. - Right atrium: No evidence of thrombus in the atrial cavity or appendage. - Atrial septum: No defect or patent foramen ovale was identified. - Tricuspid valve: No evidence of vegetation. - Pulmonic valve: No evidence of vegetation. Impressions: - Successful cardioversion. No cardiac source of emboli was indentified.  03/22/13: [facility] Final Conclusions:  1. Severe native two-vessel coronary artery disease with severe stenosis of the proximal LAD and total occlusion left circumflex 2. Mild to moderate proximal RCA stenosis 3. Status post aortocoronary bypass surgery with continued patency of the LIMA to LAD and saphenous vein graft sequential to OM 3 and OM 4   Recent Labs: 10/31/2014: ALT 22 11/09/2014: Hemoglobin 13.8; Platelets 154.0 01/04/2015: Pro B Natriuretic peptide (BNP) 205.0* 04/12/2015: BUN 26*; Creatinine, Ser 0.93; Magnesium 1.9; Potassium 4.2; Sodium 136   10/08/15 INR 2.2  CrCl  cannot be calculated (Patient has no serum creatinine result on file.).   Wt Readings from Last 3 Encounters:  10/26/15 237 lb (107.502 kg)  08/20/15 230 lb 1.9 oz (104.382 kg)  04/12/15 222 lb 12.8 oz (101.061 kg)     Other studies reviewed: Additional studies/records reviewed today include: summarized above  DEVICE information: MDT enrhythm PPM implanted by Dr. Johney FrameAllred 09/08/2005 Last EP nots reflects chronically elevated RV thresholds efforts to avoid lead revision given advanced age  ASSESSMENT AND PLAN:  1. PAFib, Avblock with PPM CHADS2Vasc is at least 4 on coumadin (given VHD) no bleeding or signs of bleeding reported by the patient Device has reached ERI, arrange generator change, discussed his lead, and possibility of lead revision if Dr. Johney FrameAllred felt indicated at the time of gen change, discussed risk/benefit and he is agreeable to proceed. Hold coumadin pending his INR pre-procedure Sotalol, last Creat OK, QTc is stable Continue Merlin remotes Q 3 months  2.  CAD no CP On ASA, statin  3. VHD/AVR (bioprosthetic) Echo as above  4. HTN Appears well controlled  5. PVD, hx of right CEA asymptomatic Will defer to his PMD/primary cardiology, he does follow with vascular   Disposition: F/u with wound check post procedure, 29month visit with Dr. Johney FrameAllred. Continue Carelink remotes Q 3 months post generator change.  Current medicines are reviewed at length with the patient today.  The patient did not have any concerns regarding medicines.  Judith BlonderSigned, Renee Ursy, PA-C 10/26/2015 3:27 PM     [facility] [address] [place] [id number] [phone] (office)  [phone] (fax)

## 2015-11-06 NOTE — Interval H&P Note (Signed)
History and Physical Interval Note:  11/06/2015 3:04 PM  Alex [name]  has presented today for surgery, with the diagnosis of eri  The various methods of treatment have been discussed with the patient and family. After consideration of risks, benefits and other options for treatment, the patient has consented to  Procedure(s): PPM Generator Changeout (N/A) as a surgical intervention .  The patient's history has been reviewed, patient examined, no change in status, stable for surgery.  I have reviewed the patient's chart and labs.  Questions were answered to the patient's satisfaction.    Risks, benefits, and alternatives to pacemaker pulse generator replacement with possible lead revision were discussed in detail today.  The patient understands that risks include but are not limited to bleeding, infection, pneumothorax, perforation, tamponade, vascular damage, renal failure, MI, stroke, death,  damage to his existing leads, and lead dislodgement and wishes to proceed.  Given advanced age and minimal RV pacing, I would anticipate using his current RV lead despite known elevated RV threshold if possible.   Alex [name]

## 2015-11-07 ENCOUNTER — Encounter (HOSPITAL_COMMUNITY): Payer: Self-pay | Admitting: Internal Medicine

## 2015-11-16 ENCOUNTER — Other Ambulatory Visit: Payer: Self-pay | Admitting: Cardiovascular Disease

## 2015-11-19 ENCOUNTER — Ambulatory Visit (INDEPENDENT_AMBULATORY_CARE_PROVIDER_SITE_OTHER): Payer: Medicare Other | Admitting: *Deleted

## 2015-11-19 ENCOUNTER — Encounter: Payer: Self-pay | Admitting: Internal Medicine

## 2015-11-19 DIAGNOSIS — R001 Bradycardia, unspecified: Secondary | ICD-10-CM | POA: Diagnosis not present

## 2015-11-19 LAB — CUP PACEART INCLINIC DEVICE CHECK
Brady Statistic AS VS Percent: 47 %
Implantable Lead Implant Date: 20061113
Implantable Lead Location: 753860
Lead Channel Impedance Value: 403 Ohm
Lead Channel Pacing Threshold Amplitude: 2 V
Lead Channel Pacing Threshold Pulse Width: 1.5 ms
Lead Channel Sensing Intrinsic Amplitude: 5.6 mV
Lead Channel Setting Pacing Amplitude: 2 V
Lead Channel Setting Pacing Amplitude: 4 V
Lead Channel Setting Pacing Pulse Width: 1.5 ms
MDC IDC LEAD IMPLANT DT: 20061113
MDC IDC LEAD LOCATION: 753859
MDC IDC MSMT BATTERY IMPEDANCE: 100 Ohm
MDC IDC MSMT BATTERY REMAINING LONGEVITY: 157 mo
MDC IDC MSMT BATTERY VOLTAGE: 2.8 V
MDC IDC MSMT LEADCHNL RA PACING THRESHOLD AMPLITUDE: 0.75 V
MDC IDC MSMT LEADCHNL RA PACING THRESHOLD AMPLITUDE: 0.75 V
MDC IDC MSMT LEADCHNL RA PACING THRESHOLD PULSEWIDTH: 0.4 ms
MDC IDC MSMT LEADCHNL RA PACING THRESHOLD PULSEWIDTH: 0.4 ms
MDC IDC MSMT LEADCHNL RA SENSING INTR AMPL: 1.4 mV
MDC IDC MSMT LEADCHNL RV IMPEDANCE VALUE: 1309 Ohm
MDC IDC SESS DTM: 20170123132242
MDC IDC SET LEADCHNL RV SENSING SENSITIVITY: 2 mV
MDC IDC STAT BRADY AP VP PERCENT: 1 %
MDC IDC STAT BRADY AP VS PERCENT: 52 %
MDC IDC STAT BRADY AS VP PERCENT: 0 %

## 2015-11-19 NOTE — Progress Notes (Signed)
Wound check appointment. Steri-strips removed. Wound without redness or edema. Incision edges approximated, wound well healed. Normal device function. Thresholds, sensing, and impedances consistent with implant measurements. Device programmed at appropriate safety margins---RV output increased from 3.5V to 4.0V (threshold 2.0V @ 1.64ms, unipolar)---1.7%Vp. Histogram distribution appropriate for patient and level of activity. (2) mode switches (<0.1%)--both <30 sec, no EGMs. (1) NSVT episode noted x 16 bts @ 70/179---EF 55-60%. Patient educated about wound care and arm mobility. ROV in 3 months with JA.

## 2015-12-18 ENCOUNTER — Other Ambulatory Visit: Payer: Self-pay | Admitting: Internal Medicine

## 2015-12-18 ENCOUNTER — Other Ambulatory Visit (HOSPITAL_COMMUNITY): Payer: Self-pay | Admitting: Nurse Practitioner

## 2015-12-29 ENCOUNTER — Other Ambulatory Visit: Payer: Self-pay | Admitting: Cardiovascular Disease

## 2015-12-29 ENCOUNTER — Other Ambulatory Visit: Payer: Self-pay | Admitting: Internal Medicine

## 2016-01-02 ENCOUNTER — Ambulatory Visit (INDEPENDENT_AMBULATORY_CARE_PROVIDER_SITE_OTHER): Payer: Medicare Other | Admitting: *Deleted

## 2016-01-02 DIAGNOSIS — I350 Nonrheumatic aortic (valve) stenosis: Secondary | ICD-10-CM

## 2016-01-02 DIAGNOSIS — R0602 Shortness of breath: Secondary | ICD-10-CM | POA: Diagnosis not present

## 2016-01-02 DIAGNOSIS — I481 Persistent atrial fibrillation: Secondary | ICD-10-CM

## 2016-01-02 DIAGNOSIS — I4819 Other persistent atrial fibrillation: Secondary | ICD-10-CM

## 2016-01-02 LAB — POCT INR: INR: 2.6

## 2016-01-02 MED ORDER — WARFARIN SODIUM 2.5 MG PO TABS: ORAL_TABLET | ORAL | Status: DC

## 2016-01-29 ENCOUNTER — Ambulatory Visit (INDEPENDENT_AMBULATORY_CARE_PROVIDER_SITE_OTHER): Payer: Medicare Other | Admitting: *Deleted

## 2016-01-29 DIAGNOSIS — R0602 Shortness of breath: Secondary | ICD-10-CM | POA: Diagnosis not present

## 2016-01-29 DIAGNOSIS — I350 Nonrheumatic aortic (valve) stenosis: Secondary | ICD-10-CM | POA: Diagnosis not present

## 2016-01-29 DIAGNOSIS — I481 Persistent atrial fibrillation: Secondary | ICD-10-CM | POA: Diagnosis not present

## 2016-01-29 DIAGNOSIS — I4819 Other persistent atrial fibrillation: Secondary | ICD-10-CM

## 2016-01-29 LAB — POCT INR: INR: 2.5

## 2016-02-08 ENCOUNTER — Other Ambulatory Visit: Payer: Self-pay | Admitting: Cardiovascular Disease

## 2016-02-14 ENCOUNTER — Encounter: Payer: Self-pay | Admitting: Internal Medicine

## 2016-02-14 ENCOUNTER — Ambulatory Visit (INDEPENDENT_AMBULATORY_CARE_PROVIDER_SITE_OTHER): Payer: Medicare Other | Admitting: Internal Medicine

## 2016-02-14 VITALS — BP 116/64 | HR 83 | Ht 74.0 in | Wt 233.8 lb

## 2016-02-14 DIAGNOSIS — I4819 Other persistent atrial fibrillation: Secondary | ICD-10-CM

## 2016-02-14 DIAGNOSIS — I481 Persistent atrial fibrillation: Secondary | ICD-10-CM | POA: Diagnosis not present

## 2016-02-14 DIAGNOSIS — I495 Sick sinus syndrome: Secondary | ICD-10-CM | POA: Diagnosis not present

## 2016-02-14 DIAGNOSIS — I480 Paroxysmal atrial fibrillation: Secondary | ICD-10-CM

## 2016-02-14 LAB — CUP PACEART INCLINIC DEVICE CHECK
Brady Statistic AP VP Percent: 3 %
Brady Statistic AP VS Percent: 75 %
Brady Statistic AS VP Percent: 0 %
Brady Statistic AS VS Percent: 22 %
Implantable Lead Implant Date: 20061113
Implantable Lead Implant Date: 20061113
Implantable Lead Location: 753859
Implantable Lead Model: 5594
Lead Channel Impedance Value: 1347 Ohm
Lead Channel Pacing Threshold Amplitude: 1 V
Lead Channel Pacing Threshold Amplitude: 2 V
Lead Channel Pacing Threshold Pulse Width: 0.4 ms
Lead Channel Pacing Threshold Pulse Width: 1.5 ms
Lead Channel Sensing Intrinsic Amplitude: 4 mV
Lead Channel Setting Pacing Amplitude: 2 V
Lead Channel Setting Pacing Amplitude: 4 V
Lead Channel Setting Sensing Sensitivity: 2 mV
MDC IDC LEAD LOCATION: 753860
MDC IDC MSMT BATTERY IMPEDANCE: 100 Ohm
MDC IDC MSMT BATTERY REMAINING LONGEVITY: 145 mo
MDC IDC MSMT BATTERY VOLTAGE: 2.79 V
MDC IDC MSMT LEADCHNL RA IMPEDANCE VALUE: 389 Ohm
MDC IDC MSMT LEADCHNL RA SENSING INTR AMPL: 1 mV
MDC IDC SESS DTM: 20170420111737
MDC IDC SET LEADCHNL RV PACING PULSEWIDTH: 1.5 ms

## 2016-02-14 NOTE — Progress Notes (Signed)
Electrophysiology Office Note   Date:  02/14/2016   ID:  MAVIS FICHERA, DOB April 21, 1931, MRN [id number]  PCP:  [name], MD  Cardiologist:  Dr Excell Seltzer  Chief Complaint  Patient presents with  . Atrial Fibrillation     History of Present Illness: Alex [name] is a 82 y.o. male who presents today for EP followup.  Doing well since recent generator change.  Denies procedure related complications.    He is maintaining sinus rhythm with sotalol.  He is doing very well.. Today, he denies symptoms of  chest pain,  ower extremity edema, claudication, dizziness, presyncope, syncope, bleeding, or neurologic sequela. The patient is tolerating medications without difficulties and is otherwise without complaint today.   Past Medical History  Diagnosis Date  . COPD (chronic obstructive pulmonary disease) (HCC)   . Hyperlipidemia   . Morbid obesity (HCC)     weight 243 pounds, BMI 31.2kg/m2, BSA 2.36 square meters  . Obstructive sleep apnea     compliant with CPAP  . History of coronary artery disease     status post stenting of the marginal circumflex in 12/2003 and again in 2009  . Aortic stenosis     s/p AVR by Dr Laneta Simmers    Current Outpatient Prescriptions  Medication Sig Dispense Refill  . aspirin 81 MG tablet Take 81 mg by mouth daily.    Marland Kitchen atorvastatin (LIPITOR) 80 MG tablet TAKE 1 TABLET BY MOUTH DAILY 90 tablet 3  . B Complex Vitamins (VITAMIN B COMPLEX PO) Take 1 tablet by mouth daily.     Marland Kitchen escitalopram (LEXAPRO) 10 MG tablet Take 10 mg by mouth at bedtime.    . furosemide (LASIX) 20 MG tablet TAKE 1 TABLET BY MOUTH DAILY 30 tablet 1  . HYDROcodone-acetaminophen (NORCO/VICODIN) 5-325 MG tablet Take 1 tablet by mouth as directed.    Marland Kitchen ibuprofen (ADVIL,MOTRIN) 200 MG tablet Take 400 mg by mouth daily as needed for moderate pain.    Marland Kitchen imiquimod (ALDARA) 5 % cream Apply 1 application topically 3 (three) times a week.  0  . metoprolol succinate (TOPROL-XL) 25 MG 24 hr tablet  Take 12.5 mg by mouth 2 (two) times daily.     . potassium chloride (K-DUR) 10 MEQ tablet TAKE 1 TABLET BY MOUTH DAILY 90 tablet 0  . sotalol (BETAPACE) 80 MG tablet TAKE 1 TABLET(80 MG) BY MOUTH TWICE DAILY 60 tablet 3  . warfarin (COUMADIN) 2.5 MG tablet Take as directed by coumadin clinic 40 tablet 1  . zolpidem (AMBIEN) 10 MG tablet Take 5-10 mg by mouth at bedtime.      No current facility-administered medications for this visit.    Allergies:   Review of patient's allergies indicates no known allergies.   Social History:  The patient  reports that he quit smoking about 12 years ago. His smoking use included Cigarettes. He started smoking about 42 years ago. He has a 15 pack-year smoking history. He does not have any smokeless tobacco history on file. He reports that he drinks alcohol. He reports that he does not use illicit drugs.   Family History:  The patient is unaware of any pertinent FH.  Denies HTN or DM in family   ROS:  Please see the history of present illness.   Positive for fatigue, unsteadiness, and bruising,   All other systems are reviewed and negative.    PHYSICAL EXAM: VS:  BP 116/64 mmHg  Pulse 83  Ht  (1.88 m)  Wt 233 lb 12.8 oz (106.051 kg)  BMI 30.01 kg/m2 , BMI Body mass index is 30.01 kg/(m^2). GEN: Well nourished, well developed, in no acute distress HEENT: normal Neck: no JVD, carotid bruits, or masses Cardiac: RRR; no murmurs, rubs, or gallops,no edema  Respiratory:  clear to auscultation bilaterally, normal work of breathing GI: soft, nontender, nondistended, + BS MS: no deformity or atrophy Skin: warm and dry, no rash, pacemaker pocket is well healed Neuro:  Strength and sensation are intact Psych: euthymic mood, full affect  EKG:  EKG is ordered today. The ekg ordered today shows AV pacing  Recent Labs: 04/12/2015: Magnesium 1.9 11/01/2015: BUN 28*; Creat 0.92; Hemoglobin 13.4; Platelets 152; Potassium 4.4; Sodium 141  No results found for  requested labs within last 365 days.     CrCl cannot be calculated (Patient has no serum creatinine result on file.).   Wt Readings from Last 3 Encounters:  02/14/16 233 lb 12.8 oz (106.051 kg)  11/06/15 225 lb (102.059 kg)  10/26/15 237 lb (107.502 kg)    ASSESSMENT AND PLAN:  1.  Persistent atrial fibrillation The patient has symptomatic persistent atrial fibrillation.  He is maintaining sinus rhythm with sotalol Labs 1/17 reviewed Would repeat bmet, mg when he sees Dr Excell Seltzerooper in June He will continue coumadin.  chads2vasc score is at least 3  2. SSS He does not V pace at this time RV pacing threshold is chronically elevated.  Given advanced age, we will try to avoid lead revision. We will follow remotely  3. CAD Stable No change required today   Current medicines are reviewed at length with the patient today.  The patient is without any concerns regarding medicines and no changes are made today.    Disposition:   FU with Dr Excell Seltzerooper as scheduled carelink every 3 months Return to see EP NP in 1 year   Randolm IdolSigned, Venancio Chenier MD  02/14/2016 10:25 AM      [facility] [address] [place] [address]  [phone] (office) [phone] (fax)

## 2016-02-14 NOTE — Patient Instructions (Signed)
Medication Instructions:  Your physician recommends that you continue on your current medications as directed. Please refer to the Current Medication list given to you today.   Labwork: None ordered   Testing/Procedures: None ordered   Follow-Up: Your physician wants you to follow-up in: 12 months with Gypsy BalsamAmber Seiler, NP You will receive a reminder letter in the mail two months in advance. If you don't receive a letter, please call our office to schedule the follow-up appointment.   Remote monitoring is used to monitor your Pacemaker  from home. This monitoring reduces the number of office visits required to check your device to one time per year. It allows us to keep an eye on the functioning of your device to ensure it is working properly. You are scheduled for a device check from home on 05/15/16. You may send your transmission at any time that day. If you have a wireless device, the transmission will be sent automatically. After your physician reviews your transmission, you will receive a postcard with your next transmission date.     Any Other Special Instructions Will Be Listed Below (If Applicable).     If you need a refill on your cardiac medications before your next appointment, please call your pharmacy.

## 2016-02-18 NOTE — Addendum Note (Signed)
Addended by: Reesa ChewJONES, Jerel Sardina G on: 02/18/2016 10:24 AM   Modules accepted: Orders

## 2016-02-26 ENCOUNTER — Ambulatory Visit (INDEPENDENT_AMBULATORY_CARE_PROVIDER_SITE_OTHER): Payer: Medicare Other | Admitting: *Deleted

## 2016-02-26 DIAGNOSIS — I350 Nonrheumatic aortic (valve) stenosis: Secondary | ICD-10-CM

## 2016-02-26 DIAGNOSIS — R0602 Shortness of breath: Secondary | ICD-10-CM

## 2016-02-26 DIAGNOSIS — I481 Persistent atrial fibrillation: Secondary | ICD-10-CM

## 2016-02-26 DIAGNOSIS — I4819 Other persistent atrial fibrillation: Secondary | ICD-10-CM

## 2016-02-26 LAB — POCT INR: INR: 1.7

## 2016-03-11 ENCOUNTER — Other Ambulatory Visit: Payer: Self-pay | Admitting: Cardiovascular Disease

## 2016-03-18 ENCOUNTER — Ambulatory Visit (INDEPENDENT_AMBULATORY_CARE_PROVIDER_SITE_OTHER): Payer: Medicare Other | Admitting: *Deleted

## 2016-03-18 DIAGNOSIS — I350 Nonrheumatic aortic (valve) stenosis: Secondary | ICD-10-CM | POA: Diagnosis not present

## 2016-03-18 DIAGNOSIS — R0602 Shortness of breath: Secondary | ICD-10-CM

## 2016-03-18 DIAGNOSIS — I4819 Other persistent atrial fibrillation: Secondary | ICD-10-CM

## 2016-03-18 DIAGNOSIS — I481 Persistent atrial fibrillation: Secondary | ICD-10-CM

## 2016-03-18 LAB — POCT INR: INR: 2.7

## 2016-03-20 ENCOUNTER — Other Ambulatory Visit: Payer: Self-pay | Admitting: Cardiovascular Disease

## 2016-03-25 ENCOUNTER — Other Ambulatory Visit: Payer: Self-pay | Admitting: Cardiovascular Disease

## 2016-04-07 ENCOUNTER — Other Ambulatory Visit (HOSPITAL_COMMUNITY): Payer: Self-pay | Admitting: Nurse Practitioner

## 2016-04-10 ENCOUNTER — Ambulatory Visit (INDEPENDENT_AMBULATORY_CARE_PROVIDER_SITE_OTHER): Payer: Medicare Other | Admitting: Cardiovascular Disease

## 2016-04-10 ENCOUNTER — Ambulatory Visit (INDEPENDENT_AMBULATORY_CARE_PROVIDER_SITE_OTHER): Payer: Medicare Other | Admitting: Pharmacist

## 2016-04-10 ENCOUNTER — Encounter: Payer: Self-pay | Admitting: Cardiovascular Disease

## 2016-04-10 VITALS — BP 120/58 | HR 60 | Ht 73.5 in | Wt 232.1 lb

## 2016-04-10 DIAGNOSIS — I5032 Chronic diastolic (congestive) heart failure: Secondary | ICD-10-CM | POA: Diagnosis not present

## 2016-04-10 DIAGNOSIS — I481 Persistent atrial fibrillation: Secondary | ICD-10-CM | POA: Diagnosis not present

## 2016-04-10 DIAGNOSIS — I480 Paroxysmal atrial fibrillation: Secondary | ICD-10-CM

## 2016-04-10 DIAGNOSIS — I350 Nonrheumatic aortic (valve) stenosis: Secondary | ICD-10-CM

## 2016-04-10 DIAGNOSIS — R0602 Shortness of breath: Secondary | ICD-10-CM | POA: Diagnosis not present

## 2016-04-10 DIAGNOSIS — I4819 Other persistent atrial fibrillation: Secondary | ICD-10-CM

## 2016-04-10 LAB — POCT INR: INR: 1.8

## 2016-04-10 NOTE — Progress Notes (Signed)
Cardiology Office Note Date:  04/10/2016   ID:  Alex [name], DOB 1931/06/18, MRN [id number]  PCP:  [name], MD  Cardiologist:  Tonny Bollman, MD    Chief Complaint  Patient presents with  . Aortic Stenosis     History of Present Illness: Alex [name] is a 82 y.o. male who presents for follow-up of diastolic heart failure, atrial fibrillation, aortic valve disease, and CAD. He has undergone permanent pacemaker placement and has established with Dr Johney Frame for device follow-up.   He underwent cardiac catheterizationafter an abnormal stress test in 2014. This demonstrated multivessel CAD with mild to moderate nonobstructive RCA stenosis, severe LAD and left circumflex stenoses, and continued patency of the LIMA to LAD graft as well as the sequential vein graft to the obtuse marginal branches.  He is here with his wife today. Doing ok, but has slowly progressive shortness of breath with activity. Feels like he's slowing down, not getting out as much, and more fatigued. No chest pain, orthopnea, PND, or leg swelling.   Past Medical History  Diagnosis Date  . COPD (chronic obstructive pulmonary disease) (HCC)   . Hyperlipidemia   . Morbid obesity (HCC)     weight 243 pounds, BMI 31.2kg/m2, BSA 2.36 square meters  . Obstructive sleep apnea     compliant with CPAP  . History of coronary artery disease     status post stenting of the marginal circumflex in 12/2003 and again in 2009  . Aortic stenosis     s/p AVR by Dr Laneta Simmers    Past Surgical History  Procedure Laterality Date  . Appendectomy    . Replacement total knee bilateral      2006  . Carotid endarterectomy      Dr Lollie Sails  . Permanent pacemaker      MDT EnRhythm implanted by Dr Lawanda Cousins in [place] for complete heart block with syncope  . Aortic valve replacement (avr)/coronary artery bypass grafting (cabg)   08/05/2011    LIMA to LAD, sequential saphenous vein graft to third and fourth obtuse marginal  branches of the circumflex, aortic valve replacement using a 23 mm Edwards pericardial valve  . Tee without cardioversion N/A 11/14/2014    Procedure: TRANSESOPHAGEAL ECHOCARDIOGRAM (TEE);  Surgeon: Donato Schultz, MD;  Location: [facility] ENDOSCOPY;  Service: Cardiovascular;  Laterality: N/A;  . Cardioversion N/A 11/14/2014    Procedure: CARDIOVERSION;  Surgeon: Donato Schultz, MD;  Location: [facility] ENDOSCOPY;  Service: Cardiovascular;  Laterality: N/A;  . Ep implantable device N/A 11/06/2015    Procedure: PPM Generator Changeout;  Surgeon: Hillis Range, MD;  Location: [facility] INVASIVE CV LAB;  Service: Cardiovascular;  Laterality: N/A;    Current Outpatient Prescriptions  Medication Sig Dispense Refill  . aspirin 81 MG tablet Take 81 mg by mouth daily.    Marland Kitchen atorvastatin (LIPITOR) 80 MG tablet TAKE 1 TABLET BY MOUTH DAILY 90 tablet 3  . B Complex Vitamins (VITAMIN B COMPLEX PO) Take 1 tablet by mouth daily.     Marland Kitchen escitalopram (LEXAPRO) 10 MG tablet Take 10 mg by mouth at bedtime.    . furosemide (LASIX) 20 MG tablet TAKE 1 TABLET BY MOUTH DAILY 30 tablet 3  . HYDROcodone-acetaminophen (NORCO/VICODIN) 5-325 MG tablet Take 1 tablet by mouth 2 (two) times daily as needed for moderate pain.     Marland Kitchen ibuprofen (ADVIL,MOTRIN) 200 MG tablet Take 400 mg by mouth daily as needed for moderate pain.    Marland Kitchen imiquimod (ALDARA) 5 %  cream Apply 1 application topically 3 (three) times a week.  0  . metoprolol succinate (TOPROL-XL) 25 MG 24 hr tablet Take 12.5 mg by mouth 2 (two) times daily.     . potassium chloride (K-DUR) 10 MEQ tablet TAKE 1 TABLET BY MOUTH DAILY 90 tablet 0  . sotalol (BETAPACE) 80 MG tablet TAKE 1 TABLET(80 MG) BY MOUTH TWICE DAILY 60 tablet 6  . warfarin (COUMADIN) 2.5 MG tablet TAKE AS DIRECTED BY COUMADIN CLINIC 40 tablet 3  . zolpidem (AMBIEN) 10 MG tablet Take 5-10 mg by mouth at bedtime.      No current facility-administered medications for this visit.    Allergies:   Review of patient's allergies  indicates no known allergies.   Social History:  The patient  reports that he quit smoking about 12 years ago. His smoking use included Cigarettes. He started smoking about 42 years ago. He has a 15 pack-year smoking history. He does not have any smokeless tobacco history on file. He reports that he drinks alcohol. He reports that he does not use illicit drugs.   Family History:  The patient's family history includes Alzheimer's disease in his mother; Other in his father; Tuberculosis in his father.    ROS:  Please see the history of present illness.  Otherwise, review of systems is positive for balance problems, easy bruising.  All other systems are reviewed and negative.   PHYSICAL EXAM: VS:  BP 120/58 mmHg  Pulse 60  Ht 6' 1.5" (1.867 m)  Wt 232 lb 1.9 oz (105.289 kg)  BMI 30.21 kg/m2 , BMI Body mass index is 30.21 kg/(m^2). GEN: Well nourished, well developed, in no acute distress HEENT: normal Neck: no JVD, no masses. No carotid bruits Cardiac: RRR with 2/6 SEM at the LSB               Respiratory:  Prolonged expiratory phase, normal work of breathing GI: soft, nontender, nondistended, + BS MS: no deformity or atrophy Ext: no pretibial edema, pedal pulses 2+= bilaterally Skin: warm and dry, no rash Neuro:  Strength and sensation are intact Psych: euthymic mood, full affect  EKG:  EKG is not ordered today.  Recent Labs: 04/12/2015: Magnesium 1.9 11/01/2015: BUN 28*; Creat 0.92; Hemoglobin 13.4; Platelets 152; Potassium 4.4; Sodium 141   Lipid Panel  No results found for: CHOL, TRIG, HDL, CHOLHDL, VLDL, LDLCALC, LDLDIRECT    Wt Readings from Last 3 Encounters:  04/10/16 232 lb 1.9 oz (105.289 kg)  02/14/16 233 lb 12.8 oz (106.051 kg)  11/06/15 225 lb (102.059 kg)     Cardiac Studies Reviewed: Echo 11-14-2014: Study Conclusions  - Left ventricle: The cavity size was normal. Wall thickness was normal. Systolic function was normal. The estimated ejection fraction was  in the range of 55% to 60%. No evidence of thrombus. - Aortic valve: A bioprosthesis was present and functioning normally. No evidence of vegetation. There was trivial regurgitation. - Mitral valve: No evidence of vegetation. There was mild regurgitation. - Left atrium: No evidence of thrombus in the atrial cavity or appendage. No evidence of thrombus in the appendage. - Right atrium: No evidence of thrombus in the atrial cavity or appendage. - Atrial septum: No defect or patent foramen ovale was identified. - Tricuspid valve: No evidence of vegetation. - Pulmonic valve: No evidence of vegetation.  Impressions:  - Successful cardioversion. No cardiac source of emboli was indentified.  ASSESSMENT AND PLAN: 1.  Chronic diastolic CHF: NYHA II. Pt becoming more  limited, but seems like generalized decline associated with aging more than progressive heart failure. His volume status is normal with no JVD or leg swelling. LVEF was normal last year by echo. Will continue current Rx.   2. Hyperlipidemia: treated with a statin drug.  3. Paroxysmal AF: anticoagulated with warfarin. Treated with sotalol. No bleeding problems reported.   4. Aortic valve disease: s/p bioprosthetic AVR. Stable valve function by TEE last year.   5. Essential HTN: BP well-controlled  6. CAD, native vessel: no angina.   Current medicines are reviewed with the patient today.  The patient does not have concerns regarding medicines.  Labs/ tests ordered today include:  No orders of the defined types were placed in this encounter.   Disposition:   FU 6 months  Signed, Tonny Bollman, MD  04/10/2016 11:50 PM    [facility] [address] [place], [place], [place]  [address] Phone: [phone]; Fax: [phone]

## 2016-04-10 NOTE — Patient Instructions (Signed)
Medication Instructions:  Your physician recommends that you continue on your current medications as directed. Please refer to the Current Medication list given to you today.   Labwork: None ordered  Testing/Procedures: None ordered  Follow-Up: Your physician wants you to follow-up in: 6 MONTHS WITH DR. COOPER   You will receive a reminder letter in the mail two months in advance. If you don't receive a letter, please call our office to schedule the follow-up appointment.   Any Other Special Instructions Will Be Listed Below (If Applicable).     If you need a refill on your cardiac medications before your next appointment, please call your pharmacy.   

## 2016-05-08 ENCOUNTER — Ambulatory Visit (INDEPENDENT_AMBULATORY_CARE_PROVIDER_SITE_OTHER): Payer: Medicare Other | Admitting: *Deleted

## 2016-05-08 DIAGNOSIS — R0602 Shortness of breath: Secondary | ICD-10-CM | POA: Diagnosis not present

## 2016-05-08 DIAGNOSIS — I350 Nonrheumatic aortic (valve) stenosis: Secondary | ICD-10-CM | POA: Diagnosis not present

## 2016-05-08 DIAGNOSIS — I481 Persistent atrial fibrillation: Secondary | ICD-10-CM | POA: Diagnosis not present

## 2016-05-08 DIAGNOSIS — I4819 Other persistent atrial fibrillation: Secondary | ICD-10-CM

## 2016-05-08 LAB — POCT INR: INR: 3

## 2016-05-15 ENCOUNTER — Ambulatory Visit (INDEPENDENT_AMBULATORY_CARE_PROVIDER_SITE_OTHER): Payer: Medicare Other | Admitting: *Deleted

## 2016-05-15 ENCOUNTER — Telehealth: Payer: Self-pay | Admitting: Internal Medicine

## 2016-05-15 ENCOUNTER — Telehealth: Payer: Self-pay | Admitting: Cardiology

## 2016-05-15 DIAGNOSIS — I495 Sick sinus syndrome: Secondary | ICD-10-CM

## 2016-05-15 NOTE — Telephone Encounter (Signed)
LMOVM reminding pt to send remote transmission.   

## 2016-05-15 NOTE — Telephone Encounter (Signed)
New Message   Pt wife calling to speak w/ RN about pt's remote check-is not able to send transmission today wanted to discuss waht to do going forward. Please call back and discuss.

## 2016-05-15 NOTE — Telephone Encounter (Signed)
Called pts wife back regarding remote transmission. Pts wife is in rehab and the My [facility] app is on her (wife) phone. Pts wife stated that they cannot find the transmitter. Pts wife stated that someone would try and find it at home. Pts wife informed that if she could not find transmitter she would need to call [employer] to send her a new one, otherwise to call the office when a transmission is sent. Pts wife voiced understanding.

## 2016-05-16 ENCOUNTER — Encounter: Payer: Self-pay | Admitting: Cardiology

## 2016-05-16 ENCOUNTER — Telehealth: Payer: Self-pay | Admitting: Internal Medicine

## 2016-05-16 NOTE — Telephone Encounter (Signed)
NeW Message  Pt wife calling to make sure remote signal was sent- if not please call back. Please call back and discuss.

## 2016-05-16 NOTE — Telephone Encounter (Signed)
Transmission received. Wife made aware.

## 2016-05-19 ENCOUNTER — Encounter: Payer: Self-pay | Admitting: Cardiology

## 2016-05-19 NOTE — Progress Notes (Signed)
Remote pacemaker transmission.   

## 2016-05-27 LAB — CUP PACEART REMOTE DEVICE CHECK
Battery Impedance: 100 Ohm
Battery Voltage: 2.79 V
Brady Statistic AP VP Percent: 6 %
Brady Statistic AS VP Percent: 1 %
Implantable Lead Implant Date: 20061113
Implantable Lead Location: 753860
Implantable Lead Model: 5076
Lead Channel Impedance Value: 394 Ohm
Lead Channel Setting Pacing Amplitude: 2 V
Lead Channel Setting Pacing Amplitude: 4 V
Lead Channel Setting Pacing Pulse Width: 1.5 ms
MDC IDC LEAD IMPLANT DT: 20061113
MDC IDC LEAD LOCATION: 753859
MDC IDC MSMT BATTERY REMAINING LONGEVITY: 141 mo
MDC IDC MSMT LEADCHNL RA PACING THRESHOLD AMPLITUDE: 0.625 V
MDC IDC MSMT LEADCHNL RA PACING THRESHOLD PULSEWIDTH: 0.4 ms
MDC IDC MSMT LEADCHNL RV IMPEDANCE VALUE: 1451 Ohm
MDC IDC MSMT LEADCHNL RV SENSING INTR AMPL: 5.6 mV
MDC IDC SESS DTM: 20170721143035
MDC IDC SET LEADCHNL RV SENSING SENSITIVITY: 2 mV
MDC IDC STAT BRADY AP VS PERCENT: 77 %
MDC IDC STAT BRADY AS VS PERCENT: 16 %

## 2016-06-05 ENCOUNTER — Ambulatory Visit (INDEPENDENT_AMBULATORY_CARE_PROVIDER_SITE_OTHER): Payer: Medicare Other | Admitting: *Deleted

## 2016-06-05 DIAGNOSIS — I350 Nonrheumatic aortic (valve) stenosis: Secondary | ICD-10-CM

## 2016-06-05 DIAGNOSIS — I4819 Other persistent atrial fibrillation: Secondary | ICD-10-CM

## 2016-06-05 DIAGNOSIS — R0602 Shortness of breath: Secondary | ICD-10-CM | POA: Diagnosis not present

## 2016-06-05 DIAGNOSIS — I481 Persistent atrial fibrillation: Secondary | ICD-10-CM | POA: Diagnosis not present

## 2016-06-05 LAB — POCT INR: INR: 1.8

## 2016-06-26 ENCOUNTER — Ambulatory Visit (INDEPENDENT_AMBULATORY_CARE_PROVIDER_SITE_OTHER): Payer: Medicare Other | Admitting: *Deleted

## 2016-06-26 ENCOUNTER — Encounter (INDEPENDENT_AMBULATORY_CARE_PROVIDER_SITE_OTHER): Payer: Self-pay

## 2016-06-26 DIAGNOSIS — R0602 Shortness of breath: Secondary | ICD-10-CM

## 2016-06-26 DIAGNOSIS — I350 Nonrheumatic aortic (valve) stenosis: Secondary | ICD-10-CM

## 2016-06-26 DIAGNOSIS — I481 Persistent atrial fibrillation: Secondary | ICD-10-CM | POA: Diagnosis not present

## 2016-06-26 DIAGNOSIS — I4819 Other persistent atrial fibrillation: Secondary | ICD-10-CM

## 2016-06-26 LAB — POCT INR: INR: 2.8

## 2016-06-27 ENCOUNTER — Other Ambulatory Visit: Payer: Self-pay | Admitting: Cardiovascular Disease

## 2016-07-14 ENCOUNTER — Other Ambulatory Visit: Payer: Self-pay | Admitting: Cardiovascular Disease

## 2016-07-24 ENCOUNTER — Ambulatory Visit (INDEPENDENT_AMBULATORY_CARE_PROVIDER_SITE_OTHER): Payer: Medicare Other

## 2016-07-24 DIAGNOSIS — I481 Persistent atrial fibrillation: Secondary | ICD-10-CM | POA: Diagnosis not present

## 2016-07-24 DIAGNOSIS — R0602 Shortness of breath: Secondary | ICD-10-CM

## 2016-07-24 DIAGNOSIS — I350 Nonrheumatic aortic (valve) stenosis: Secondary | ICD-10-CM | POA: Diagnosis not present

## 2016-07-24 DIAGNOSIS — I4819 Other persistent atrial fibrillation: Secondary | ICD-10-CM

## 2016-07-24 LAB — POCT INR: INR: 1.9

## 2016-08-18 ENCOUNTER — Telehealth: Payer: Self-pay | Admitting: Cardiology

## 2016-08-18 ENCOUNTER — Encounter: Payer: Medicare Other | Admitting: *Deleted

## 2016-08-18 NOTE — Telephone Encounter (Signed)
LMOVM reminding pt to send remote transmission.   

## 2016-08-21 ENCOUNTER — Ambulatory Visit (INDEPENDENT_AMBULATORY_CARE_PROVIDER_SITE_OTHER): Payer: Medicare Other

## 2016-08-21 ENCOUNTER — Encounter: Payer: Self-pay | Admitting: Cardiology

## 2016-08-21 DIAGNOSIS — R0602 Shortness of breath: Secondary | ICD-10-CM

## 2016-08-21 DIAGNOSIS — I350 Nonrheumatic aortic (valve) stenosis: Secondary | ICD-10-CM

## 2016-08-21 DIAGNOSIS — I4819 Other persistent atrial fibrillation: Secondary | ICD-10-CM

## 2016-08-21 DIAGNOSIS — I481 Persistent atrial fibrillation: Secondary | ICD-10-CM

## 2016-08-21 LAB — POCT INR: INR: 1.6

## 2016-09-04 ENCOUNTER — Ambulatory Visit (INDEPENDENT_AMBULATORY_CARE_PROVIDER_SITE_OTHER): Payer: Medicare Other | Admitting: *Deleted

## 2016-09-04 ENCOUNTER — Encounter (INDEPENDENT_AMBULATORY_CARE_PROVIDER_SITE_OTHER): Payer: Self-pay

## 2016-09-04 DIAGNOSIS — R0602 Shortness of breath: Secondary | ICD-10-CM

## 2016-09-04 DIAGNOSIS — I350 Nonrheumatic aortic (valve) stenosis: Secondary | ICD-10-CM

## 2016-09-04 DIAGNOSIS — I4819 Other persistent atrial fibrillation: Secondary | ICD-10-CM

## 2016-09-04 DIAGNOSIS — I481 Persistent atrial fibrillation: Secondary | ICD-10-CM

## 2016-09-04 LAB — POCT INR: INR: 1.4

## 2016-09-11 ENCOUNTER — Ambulatory Visit (INDEPENDENT_AMBULATORY_CARE_PROVIDER_SITE_OTHER): Payer: Medicare Other

## 2016-09-11 DIAGNOSIS — R0602 Shortness of breath: Secondary | ICD-10-CM | POA: Diagnosis not present

## 2016-09-11 DIAGNOSIS — I481 Persistent atrial fibrillation: Secondary | ICD-10-CM

## 2016-09-11 DIAGNOSIS — I350 Nonrheumatic aortic (valve) stenosis: Secondary | ICD-10-CM | POA: Diagnosis not present

## 2016-09-11 DIAGNOSIS — I4819 Other persistent atrial fibrillation: Secondary | ICD-10-CM

## 2016-09-11 LAB — POCT INR: INR: 3.1

## 2016-09-25 ENCOUNTER — Ambulatory Visit (INDEPENDENT_AMBULATORY_CARE_PROVIDER_SITE_OTHER): Payer: Medicare Other | Admitting: *Deleted

## 2016-09-25 DIAGNOSIS — I4819 Other persistent atrial fibrillation: Secondary | ICD-10-CM

## 2016-09-25 DIAGNOSIS — I350 Nonrheumatic aortic (valve) stenosis: Secondary | ICD-10-CM | POA: Diagnosis not present

## 2016-09-25 DIAGNOSIS — R0602 Shortness of breath: Secondary | ICD-10-CM | POA: Diagnosis not present

## 2016-09-25 DIAGNOSIS — I481 Persistent atrial fibrillation: Secondary | ICD-10-CM | POA: Diagnosis not present

## 2016-09-25 LAB — POCT INR: INR: 2.6

## 2016-09-25 MED ORDER — WARFARIN SODIUM 2.5 MG PO TABS: ORAL_TABLET | ORAL | 1 refills | Status: DC

## 2016-10-16 ENCOUNTER — Ambulatory Visit (INDEPENDENT_AMBULATORY_CARE_PROVIDER_SITE_OTHER): Payer: Medicare Other | Admitting: *Deleted

## 2016-10-16 ENCOUNTER — Encounter (INDEPENDENT_AMBULATORY_CARE_PROVIDER_SITE_OTHER): Payer: Self-pay

## 2016-10-16 DIAGNOSIS — R0602 Shortness of breath: Secondary | ICD-10-CM

## 2016-10-16 DIAGNOSIS — I481 Persistent atrial fibrillation: Secondary | ICD-10-CM | POA: Diagnosis not present

## 2016-10-16 DIAGNOSIS — I350 Nonrheumatic aortic (valve) stenosis: Secondary | ICD-10-CM

## 2016-10-16 DIAGNOSIS — I4819 Other persistent atrial fibrillation: Secondary | ICD-10-CM

## 2016-10-16 LAB — POCT INR: INR: 1.8

## 2016-10-30 ENCOUNTER — Ambulatory Visit (INDEPENDENT_AMBULATORY_CARE_PROVIDER_SITE_OTHER): Payer: Medicare Other | Admitting: *Deleted

## 2016-10-30 DIAGNOSIS — R0602 Shortness of breath: Secondary | ICD-10-CM

## 2016-10-30 DIAGNOSIS — I350 Nonrheumatic aortic (valve) stenosis: Secondary | ICD-10-CM | POA: Diagnosis not present

## 2016-10-30 DIAGNOSIS — I481 Persistent atrial fibrillation: Secondary | ICD-10-CM

## 2016-10-30 DIAGNOSIS — I4819 Other persistent atrial fibrillation: Secondary | ICD-10-CM

## 2016-10-30 LAB — POCT INR: INR: 2.2

## 2016-11-03 ENCOUNTER — Encounter (HOSPITAL_COMMUNITY): Payer: Self-pay | Admitting: Nurse Practitioner

## 2016-11-03 ENCOUNTER — Ambulatory Visit (HOSPITAL_COMMUNITY)
Admission: RE | Admit: 2016-11-03 | Discharge: 2016-11-03 | Disposition: A | Discharge status: Home / Self Care | Payer: Medicare Other | Source: Ambulatory Visit | Attending: Nurse Practitioner | Admitting: Nurse Practitioner

## 2016-11-03 VITALS — BP 138/64 | HR 77 | Ht 73.5 in | Wt 233.0 lb

## 2016-11-03 DIAGNOSIS — J449 Chronic obstructive pulmonary disease, unspecified: Secondary | ICD-10-CM | POA: Insufficient documentation

## 2016-11-03 DIAGNOSIS — I480 Paroxysmal atrial fibrillation: Secondary | ICD-10-CM

## 2016-11-03 DIAGNOSIS — Z79899 Other long term (current) drug therapy: Secondary | ICD-10-CM | POA: Insufficient documentation

## 2016-11-03 DIAGNOSIS — Z96653 Presence of artificial knee joint, bilateral: Secondary | ICD-10-CM | POA: Diagnosis not present

## 2016-11-03 DIAGNOSIS — I4892 Unspecified atrial flutter: Secondary | ICD-10-CM | POA: Diagnosis not present

## 2016-11-03 DIAGNOSIS — Z955 Presence of coronary angioplasty implant and graft: Secondary | ICD-10-CM | POA: Insufficient documentation

## 2016-11-03 DIAGNOSIS — I4891 Unspecified atrial fibrillation: Secondary | ICD-10-CM | POA: Insufficient documentation

## 2016-11-03 DIAGNOSIS — Z952 Presence of prosthetic heart valve: Secondary | ICD-10-CM | POA: Diagnosis not present

## 2016-11-03 DIAGNOSIS — Z87891 Personal history of nicotine dependence: Secondary | ICD-10-CM | POA: Insufficient documentation

## 2016-11-03 DIAGNOSIS — I2510 Atherosclerotic heart disease of native coronary artery without angina pectoris: Secondary | ICD-10-CM | POA: Insufficient documentation

## 2016-11-03 DIAGNOSIS — E785 Hyperlipidemia, unspecified: Secondary | ICD-10-CM | POA: Insufficient documentation

## 2016-11-03 DIAGNOSIS — Z7901 Long term (current) use of anticoagulants: Secondary | ICD-10-CM | POA: Insufficient documentation

## 2016-11-03 DIAGNOSIS — G4733 Obstructive sleep apnea (adult) (pediatric): Secondary | ICD-10-CM | POA: Diagnosis not present

## 2016-11-03 DIAGNOSIS — Z7982 Long term (current) use of aspirin: Secondary | ICD-10-CM | POA: Insufficient documentation

## 2016-11-03 DIAGNOSIS — Z951 Presence of aortocoronary bypass graft: Secondary | ICD-10-CM | POA: Diagnosis not present

## 2016-11-03 DIAGNOSIS — Z950 Presence of cardiac pacemaker: Secondary | ICD-10-CM | POA: Diagnosis not present

## 2016-11-03 LAB — BASIC METABOLIC PANEL
ANION GAP: 7 (ref 5–15)
BUN: 25 mg/dL — ABNORMAL HIGH (ref 6–20)
CHLORIDE: 103 mmol/L (ref 101–111)
CO2: 31 mmol/L (ref 22–32)
Calcium: 9.1 mg/dL (ref 8.9–10.3)
Creatinine, Ser: 1.07 mg/dL (ref 0.61–1.24)
GFR calc non Af Amer: >60 mL/min (ref >60)
Glucose, Bld: 131 mg/dL — ABNORMAL HIGH (ref 65–99)
POTASSIUM: 4.2 mmol/L (ref 3.5–5.1)
Sodium: 141 mmol/L (ref 135–145)

## 2016-11-03 LAB — CBC
HEMATOCRIT: 38.2 % — AB (ref 39.0–52.0)
HEMOGLOBIN: 12.8 g/dL — AB (ref 13.0–17.0)
MCH: 34.5 pg — ABNORMAL HIGH (ref 26.0–34.0)
MCHC: 33.5 g/dL (ref 30.0–36.0)
MCV: 103 fL — ABNORMAL HIGH (ref 78.0–100.0)
Platelets: 162 10*3/uL (ref 150–400)
RBC: 3.71 MIL/uL — AB (ref 4.22–5.81)
RDW: 13.4 % (ref 11.5–15.5)
WBC: 6.5 10*3/uL (ref 4.0–10.5)

## 2016-11-03 LAB — PROTIME-INR
INR: 2.42
Prothrombin Time: 26.8 seconds — ABNORMAL HIGH (ref 11.4–15.2)

## 2016-11-03 LAB — TSH: TSH: 0.719 u[IU]/mL (ref 0.350–4.500)

## 2016-11-03 LAB — MAGNESIUM: MAGNESIUM: 1.6 mg/dL — AB (ref 1.7–2.4)

## 2016-11-03 NOTE — Progress Notes (Signed)
Primary Care Physician: Elijio MilesWEAVER,Lopaka W, MD Referring Physician: Dr. Meyer RusselAllred   Alex [name] is a 83 y.o. male with a h/o CAD, s/p AVR, COPD, OSA with cpap, PPM, that asked to be seen in the afib clinic urgently for not feeling well, more short of breath with exertion that he first noticed about two weeks ago but has gotten worse over the last few days. Device interrogated by Noreene LarssonJill and shows afib/flutter with controlled v rates since 1/2-1/3. Fluid status stable. No PND/orthopnea. States no change in his heatlh. No triggers. No change in meds. Continues on sotalol and warfarin. INR last week at 2.2. His wife has just gotten out of rehab due to fx of pelvis.  Today, he denies symptoms of palpitations, chest pain, shortness of breath, orthopnea, PND, lower extremity edema, dizziness, presyncope, syncope, or neurologic sequela. Positive for fatigue and shortness of breath with exertion.The patient is tolerating medications without difficulties and is otherwise without complaint today.   Past Medical History:  Diagnosis Date  . Aortic stenosis    s/p AVR by Dr Laneta SimmersBartle  . COPD (chronic obstructive pulmonary disease) (HCC)   . History of coronary artery disease    status post stenting of the marginal circumflex in 12/2003 and again in 2009  . Hyperlipidemia   . Morbid obesity (HCC)    weight 243 pounds, BMI 31.2kg/m2, BSA 2.36 square meters  . Obstructive sleep apnea    compliant with CPAP   Past Surgical History:  Procedure Laterality Date  . AORTIC VALVE REPLACEMENT (AVR)/CORONARY ARTERY BYPASS GRAFTING (CABG)   08/05/2011   LIMA to LAD, sequential saphenous vein graft to third and fourth obtuse marginal branches of the circumflex, aortic valve replacement using a 23 mm Edwards pericardial valve  . APPENDECTOMY    . CARDIOVERSION N/A 11/14/2014   Procedure: CARDIOVERSION;  Surgeon: Donato SchultzMark Skains, MD;  [facility];  Service: Cardiovascular;  Laterality: N/A;  . CAROTID ENDARTERECTOMY       Dr Lollie Sailsale Williams  . EP IMPLANTABLE DEVICE N/A 11/06/2015   Procedure: PPM Generator Changeout;  Surgeon: Hillis RangeJames Allred, MD;  Location: [facility] INVASIVE CV LAB;  Service: Cardiovascular;  Laterality: N/A;  . permanent pacemaker     MDT EnRhythm implanted by Dr Lawanda CousinsAl-Kori in [place] for complete heart block with syncope  . REPLACEMENT TOTAL KNEE BILATERAL     2006  . TEE WITHOUT CARDIOVERSION N/A 11/14/2014   Procedure: TRANSESOPHAGEAL ECHOCARDIOGRAM (TEE);  Surgeon: Donato SchultzMark Skains, MD;  [facility];  Service: Cardiovascular;  Laterality: N/A;    Current Outpatient Prescriptions  Medication Sig Dispense Refill  . aspirin 81 MG tablet Take 81 mg by mouth daily.    Marland Kitchen. atorvastatin (LIPITOR) 80 MG tablet TAKE 1 TABLET BY MOUTH DAILY 90 tablet 3  . B Complex Vitamins (VITAMIN B COMPLEX PO) Take 1 tablet by mouth daily.     Marland Kitchen. escitalopram (LEXAPRO) 10 MG tablet Take 10 mg by mouth at bedtime.    . furosemide (LASIX) 20 MG tablet TAKE 1 TABLET BY MOUTH DAILY 30 tablet 8  . HYDROcodone-acetaminophen (NORCO/VICODIN) 5-325 MG tablet Take 1 tablet by mouth 2 (two) times daily as needed for moderate pain.     Marland Kitchen. ibuprofen (ADVIL,MOTRIN) 200 MG tablet Take 400 mg by mouth daily as needed for moderate pain.    Marland Kitchen. imiquimod (ALDARA) 5 % cream Apply 1 application topically 3 (three) times a week.  0  . metoprolol succinate (TOPROL-XL) 25 MG 24 hr tablet Take 12.5  mg by mouth 2 (two) times daily.     . potassium chloride (K-DUR) 10 MEQ tablet TAKE 1 TABLET BY MOUTH DAILY 90 tablet 0  . sotalol (BETAPACE) 80 MG tablet TAKE 1 TABLET(80 MG) BY MOUTH TWICE DAILY 60 tablet 6  . warfarin (COUMADIN) 2.5 MG tablet Take as directed by coumadin clinic 40 tablet 1  . zolpidem (AMBIEN) 10 MG tablet Take 5-10 mg by mouth at bedtime.      No current facility-administered medications for this encounter.     No Known Allergies  Social History   Social History  . Marital status: Married    Spouse name: N/A  .  Number of children: N/A  . Years of education: N/A   Occupational History  . Not on file.   Social History Main Topics  . Smoking status: Former Smoker    Packs/day: 0.50    Years: 30.00    Types: Cigarettes    Start date: 10/27/1973    Quit date: 10/28/2003  . Smokeless tobacco: Not on file  . Alcohol use 0.0 oz/week     Comment: 8 oz of wine per night  . Drug use: No  . Sexual activity: Not on file   Other Topics Concern  . Not on file   Social History Narrative   Lives in [place] with spouse.  Owns a Designer, industrial/product.    Family History  Problem Relation Age of Onset  . Alzheimer's disease Mother   . Tuberculosis Father   . Other Father     Spinal Meningitis    ROS- All systems are reviewed and negative except as per the HPI above  Physical Exam: Vitals:   11/03/16 1502  BP: 138/64  Pulse: 77  Weight: 233 lb (105.7 kg)  Height: 6' 1.5" (1.867 m)   Wt Readings from Last 3 Encounters:  11/03/16 233 lb (105.7 kg)  04/10/16 232 lb 1.9 oz (105.3 kg)  02/14/16 233 lb 12.8 oz (106.1 kg)    Labs: Lab Results  Component Value Date   NA 141 11/01/2015   K 4.4 11/01/2015   CL 103 11/01/2015   CO2 30 11/01/2015   GLUCOSE 88 11/01/2015   BUN 28 (H) 11/01/2015   CREATININE 0.92 11/01/2015   CALCIUM 9.0 11/01/2015   MG 1.9 04/12/2015   Lab Results  Component Value Date   INR 2.2 10/30/2016   No results found for: CHOL, HDL, LDLCALC, TRIG   GEN- The patient is well appearing, alert and oriented x 3 today.   Head- normocephalic, atraumatic Eyes-  Sclera clear, conjunctiva pink Ears- hearing intact Oropharynx- clear Neck- supple, no JVP Lymph- no cervical lymphadenopathy Lungs- Clear to ausculation bilaterally, normal work of breathing Heart- irregular rate and rhythm, no murmurs, rubs or gallops, PMI not laterally displaced GI- soft, NT, ND, + BS Extremities- no clubbing, cyanosis, or edema MS- no significant deformity or atrophy Skin-  no rash or lesion Psych- euthymic mood, full affect Neuro- strength and sensation are intact  EKG- shows undetermined rhythm at 74 bpm, qrs int 86 ms, qtc 448 ms Interrogation performed showing afib/flutter x 5 days with controlled v rates Epic records reviewed    Assessment and Plan: 1. Afib/flutter Discussed with pt a cardioversion but he is very unsure Last week INR was 2.2 but 12/21 he was subtherapeutic Will continue sotalol  No need for extra rate control med since he has CVR INR, BMET, mag, tsh ,cbc today  Will refer back  to Dr. Johney Frame at his request, appointment for Wednesday 1/10  [facility] [address] [place], [place] [address] [phone]

## 2016-11-04 ENCOUNTER — Other Ambulatory Visit (HOSPITAL_COMMUNITY): Payer: Self-pay | Admitting: *Deleted

## 2016-11-04 DIAGNOSIS — I4819 Other persistent atrial fibrillation: Secondary | ICD-10-CM

## 2016-11-04 MED ORDER — MAGNESIUM 250 MG PO TABS: 250.0000 mg | ORAL_TABLET | Freq: Every day | ORAL | 0 refills | Status: DC

## 2016-11-05 ENCOUNTER — Encounter: Payer: Self-pay | Admitting: Internal Medicine

## 2016-11-05 ENCOUNTER — Ambulatory Visit (INDEPENDENT_AMBULATORY_CARE_PROVIDER_SITE_OTHER): Payer: Medicare Other | Admitting: Internal Medicine

## 2016-11-05 VITALS — BP 132/78 | HR 82 | Ht 74.0 in | Wt 234.8 lb

## 2016-11-05 DIAGNOSIS — I495 Sick sinus syndrome: Secondary | ICD-10-CM | POA: Diagnosis not present

## 2016-11-05 DIAGNOSIS — I480 Paroxysmal atrial fibrillation: Secondary | ICD-10-CM

## 2016-11-05 NOTE — Patient Instructions (Signed)
Medication Instructions:  Your physician recommends that you continue on your current medications as directed. Please refer to the Current Medication list given to you today.   Labwork: Your physician recommends that you return for lab work ---will need weekly INR's and pre cardioversion labs on 11/14/15   Testing/Procedures: Your physician has recommended that you have a Cardioversion (DCCV). Electrical Cardioversion uses a jolt of electricity to your heart either through paddles or wired patches attached to your chest. This is a controlled, usually prescheduled, procedure. Defibrillation is done under light anesthesia in the hospital, and you usually go home the day of the procedure. This is done to get your heart back into a normal rhythm. You are not awake for the procedure. Please see the instruction sheet given to you today.---11/20/16  [facility] at 10:30am Do not eat or drink after midnight the night before your procedure Okay to take your medication the morning of the procedure with a small sip of water Will need someone to drive you home after the procedure    Follow-Up: Your physician recommends that you schedule a follow-up appointment in: 6 weeks with Dr Johney FrameAllred   Any Other Special Instructions Will Be Listed Below (If Applicable).     If you need a refill on your cardiac medications before your next appointment, please call your pharmacy.

## 2016-11-05 NOTE — Progress Notes (Signed)
Electrophysiology Office Note   Date:  11/05/2016   ID:  Alex [name], DOB 1931/05/28, MRN [id number]  PCP:  [name], MD  Cardiologist:  Dr Excell Seltzer  Chief Complaint  Patient presents with  . Pacemaker Check    PAF     History of Present Illness: Alex [name] is a 83 y.o. male who presents today for EP followup.  He recently went into atrial fibrillation.  He reports fatigue and decreased exercise tolerance. Today, he denies symptoms of  chest pain,  lower extremity edema, claudication, dizziness, presyncope, syncope, bleeding, or neurologic sequela. The patient is tolerating medications without difficulties and is otherwise without complaint today.   Past Medical History:  Diagnosis Date  . Aortic stenosis    s/p AVR by Dr Laneta Simmers  . COPD (chronic obstructive pulmonary disease) (HCC)   . History of coronary artery disease    status post stenting of the marginal circumflex in 12/2003 and again in 2009  . Hyperlipidemia   . Morbid obesity (HCC)    weight 243 pounds, BMI 31.2kg/m2, BSA 2.36 square meters  . Obstructive sleep apnea    compliant with CPAP  . Persistent atrial fibrillation [facility])    Past Surgical History:  Procedure Laterality Date  . AORTIC VALVE REPLACEMENT (AVR)/CORONARY ARTERY BYPASS GRAFTING (CABG)   08/05/2011   LIMA to LAD, sequential saphenous vein graft to third and fourth obtuse marginal branches of the circumflex, aortic valve replacement using a 23 mm Edwards pericardial valve  . APPENDECTOMY    . CARDIOVERSION N/A 11/14/2014   Procedure: CARDIOVERSION;  Surgeon: Donato Schultz, MD;  Location: [facility] ENDOSCOPY;  Service: Cardiovascular;  Laterality: N/A;  . CAROTID ENDARTERECTOMY     Dr Lollie Sails  . EP IMPLANTABLE DEVICE N/A 11/06/2015   Procedure: PPM Generator Changeout;for sick sinus syndrome with a MDT Adapta L PPM, chronically elevated RV threshold.  . permanent pacemaker     MDT EnRhythm implanted by Dr Lawanda Cousins in [place] for complete  heart block with syncope  . REPLACEMENT TOTAL KNEE BILATERAL     2006  . TEE WITHOUT CARDIOVERSION N/A 11/14/2014   Procedure: TRANSESOPHAGEAL ECHOCARDIOGRAM (TEE);  Surgeon: Donato Schultz, MD;  Location: [facility] ENDOSCOPY;  Service: Cardiovascular;  Laterality: N/A;     Current Outpatient Prescriptions  Medication Sig Dispense Refill  . aspirin 81 MG tablet Take 81 mg by mouth daily.    Marland Kitchen atorvastatin (LIPITOR) 80 MG tablet TAKE 1 TABLET BY MOUTH DAILY 90 tablet 3  . B Complex Vitamins (VITAMIN B COMPLEX PO) Take 1 tablet by mouth daily.     Marland Kitchen escitalopram (LEXAPRO) 10 MG tablet Take 10 mg by mouth at bedtime.    . furosemide (LASIX) 20 MG tablet TAKE 1 TABLET BY MOUTH DAILY 30 tablet 8  . HYDROcodone-acetaminophen (NORCO/VICODIN) 5-325 MG tablet Take 1 tablet by mouth 2 (two) times daily as needed for moderate pain.     Marland Kitchen ibuprofen (ADVIL,MOTRIN) 200 MG tablet Take 400 mg by mouth daily as needed for moderate pain.    . Magnesium 250 MG TABS Take 1 tablet (250 mg total) by mouth daily.  0  . metoprolol succinate (TOPROL-XL) 25 MG 24 hr tablet Take 12.5 mg by mouth 2 (two) times daily.     . potassium chloride (K-DUR) 10 MEQ tablet TAKE 1 TABLET BY MOUTH DAILY 90 tablet 0  . sotalol (BETAPACE) 80 MG tablet TAKE 1 TABLET(80 MG) BY MOUTH TWICE DAILY 60 tablet 6  . warfarin (  COUMADIN) 2.5 MG tablet Take as directed by coumadin clinic 40 tablet 1  . zolpidem (AMBIEN) 10 MG tablet Take 5-10 mg by mouth at bedtime.      No current facility-administered medications for this visit.     Allergies:   Patient has no known allergies.   Social History:  The patient  reports that he quit smoking about 13 years ago. His smoking use included Cigarettes. He started smoking about 43 years ago. He has a 15.00 pack-year smoking history. He does not have any smokeless tobacco history on file. He reports that he drinks alcohol. He reports that he does not use drugs.   Family History:  The patient is unaware of any  pertinent FH.  Denies HTN or DM in family   ROS:  Please see the history of present illness.   Positive for fatigue, unsteadiness, and bruising,   All other systems are reviewed and negative.    PHYSICAL EXAM: VS:  BP 132/78   Pulse 82   Ht 6\' 2"  (1.88 m)   Wt 234 lb 12.8 oz (106.5 kg)   BMI 30.15 kg/m  , BMI Body mass index is 30.15 kg/m. GEN: Well nourished, well developed, in no acute distress  HEENT: normal  Neck: no JVD, carotid bruits, or masses Cardiac: RRR (paced); no murmurs, rubs, or gallops,no edema  Respiratory:  clear to auscultation bilaterally, normal work of breathing GI: soft, nontender, nondistended, + BS MS: no deformity or atrophy  Skin: warm and dry, no rash, pacemaker pocket is well healed Neuro:  Strength and sensation are intact Psych: euthymic mood, full affect  Recent Labs: 11/03/2016: BUN 25; Creatinine, Ser 1.07; Hemoglobin 12.8; Magnesium 1.6; Platelets 162; Potassium 4.2; Sodium 141; TSH 0.719  No results found for requested labs within last 8760 hours.     Estimated Creatinine Clearance: 68 mL/min (by C-G formula based on SCr of 1.07 mg/dL).   Wt Readings from Last 3 Encounters:  11/05/16 234 lb 12.8 oz (106.5 kg)  11/03/16 233 lb (105.7 kg)  04/10/16 232 lb 1.9 oz (105.3 kg)    ASSESSMENT AND PLAN:  1.  Persistent atrial fibrillation The patient has symptomatic persistent atrial fibrillation.  He is now in afib.  I am not convinced that he has failed medical therapy with sotalol yet.  I would advise that we cardiovert and then reassess.  I am reluctant to increase sotalol.  I think that should his AF burden increase, we may consider amiodarone at that time. INR 1.8 10/17/16.  He has been therapeutic since. Weekly INRs and then cardioversion 11/20/16 (will have been therapeutic for at least 3 weeks at that point). Risks of cardioversion discussed with the patient who wishes to proceed.  He will continue coumadin.  chads2vasc score is at  least 3  2. SSS Normal pacemaker function See Pace Art report  RV pacing threshold is chronically elevated.  Given advanced age, we have opted to avoid lead revision. We will follow remotely  3. CAD Stable No change required today   Current medicines are reviewed at length with the patient today.  The patient is without any concerns regarding medicines and no changes are made today.    Disposition:   FU with Dr Excell Seltzerooper as scheduled carelink every 3 months Return to see me 4 weeks after cardioversion   Randolm IdolSigned, Kharson Rasmusson MD  11/05/2016 10:46 AM      [facility] [address] [place] [address]  [phone] (  office) [phone] (fax)

## 2016-11-13 ENCOUNTER — Other Ambulatory Visit: Payer: Medicare Other

## 2016-11-14 ENCOUNTER — Ambulatory Visit (INDEPENDENT_AMBULATORY_CARE_PROVIDER_SITE_OTHER): Payer: Medicare Other | Admitting: *Deleted

## 2016-11-14 ENCOUNTER — Other Ambulatory Visit: Payer: Medicare Other | Admitting: *Deleted

## 2016-11-14 DIAGNOSIS — I4819 Other persistent atrial fibrillation: Secondary | ICD-10-CM

## 2016-11-14 DIAGNOSIS — I350 Nonrheumatic aortic (valve) stenosis: Secondary | ICD-10-CM | POA: Diagnosis not present

## 2016-11-14 DIAGNOSIS — I481 Persistent atrial fibrillation: Secondary | ICD-10-CM

## 2016-11-14 DIAGNOSIS — I480 Paroxysmal atrial fibrillation: Secondary | ICD-10-CM

## 2016-11-14 DIAGNOSIS — R0602 Shortness of breath: Secondary | ICD-10-CM | POA: Diagnosis not present

## 2016-11-14 LAB — POCT INR: INR: 3.7

## 2016-11-15 LAB — CBC WITH DIFFERENTIAL/PLATELET
BASOS ABS: 0.1 10*3/uL (ref 0.0–0.2)
Basos: 1 %
EOS (ABSOLUTE): 0.1 10*3/uL (ref 0.0–0.4)
EOS: 2 %
Hematocrit: 37.6 % (ref 37.5–51.0)
Hemoglobin: 12.4 g/dL — ABNORMAL LOW (ref 13.0–17.7)
Immature Grans (Abs): 0 10*3/uL (ref 0.0–0.1)
Immature Granulocytes: 0 %
LYMPHS: 25 %
Lymphocytes Absolute: 1.7 10*3/uL (ref 0.7–3.1)
MCH: 33.7 pg — ABNORMAL HIGH (ref 26.6–33.0)
MCHC: 33 g/dL (ref 31.5–35.7)
MCV: 102 fL — AB (ref 79–97)
Monocytes Absolute: 0.8 10*3/uL (ref 0.1–0.9)
Monocytes: 12 %
NEUTROS ABS: 4 10*3/uL (ref 1.4–7.0)
Neutrophils: 60 %
PLATELETS: 181 10*3/uL (ref 150–379)
RBC: 3.68 x10E6/uL — ABNORMAL LOW (ref 4.14–5.80)
RDW: 13.9 % (ref 12.3–15.4)
WBC: 6.7 10*3/uL (ref 3.4–10.8)

## 2016-11-15 LAB — BASIC METABOLIC PANEL
BUN / CREAT RATIO: 24 (ref 10–24)
BUN: 23 mg/dL (ref 8–27)
CALCIUM: 8.7 mg/dL (ref 8.6–10.2)
CHLORIDE: 98 mmol/L (ref 96–106)
CO2: 31 mmol/L — ABNORMAL HIGH (ref 18–29)
CREATININE: 0.96 mg/dL (ref 0.76–1.27)
GFR, EST AFRICAN AMERICAN: 84 mL/min/{1.73_m2} (ref >59)
GFR, EST NON AFRICAN AMERICAN: 73 mL/min/{1.73_m2} (ref >59)
Glucose: 74 mg/dL (ref 65–99)
Potassium: 4.2 mmol/L (ref 3.5–5.2)
Sodium: 142 mmol/L (ref 134–144)

## 2016-11-17 LAB — CUP PACEART INCLINIC DEVICE CHECK
Battery Impedance: 110 Ohm
Brady Statistic AP VP Percent: 22 %
Brady Statistic AP VS Percent: 2 %
Brady Statistic AS VS Percent: 58 %
Date Time Interrogation Session: 20180110162755
Implantable Lead Implant Date: 20061113
Implantable Lead Location: 753859
Implantable Lead Model: 5076
Implantable Lead Model: 5594
Lead Channel Impedance Value: 1525 Ohm
Lead Channel Pacing Threshold Pulse Width: 1.5 ms
Lead Channel Sensing Intrinsic Amplitude: 1.4 mV
Lead Channel Sensing Intrinsic Amplitude: 4 mV
Lead Channel Setting Pacing Amplitude: 2 V
Lead Channel Setting Pacing Pulse Width: 1.5 ms
MDC IDC LEAD IMPLANT DT: 20061113
MDC IDC LEAD LOCATION: 753860
MDC IDC MSMT BATTERY REMAINING LONGEVITY: 97 mo
MDC IDC MSMT BATTERY VOLTAGE: 2.78 V
MDC IDC MSMT LEADCHNL RA IMPEDANCE VALUE: 388 Ohm
MDC IDC MSMT LEADCHNL RA PACING THRESHOLD AMPLITUDE: 2 V
MDC IDC MSMT LEADCHNL RA PACING THRESHOLD PULSEWIDTH: 0.4 ms
MDC IDC MSMT LEADCHNL RV PACING THRESHOLD AMPLITUDE: 2 V
MDC IDC PG IMPLANT DT: 20170110
MDC IDC SET LEADCHNL RV PACING AMPLITUDE: 4 V
MDC IDC SET LEADCHNL RV SENSING SENSITIVITY: 2 mV
MDC IDC STAT BRADY AS VP PERCENT: 19 %

## 2016-11-20 ENCOUNTER — Ambulatory Visit (HOSPITAL_COMMUNITY): Payer: Medicare Other | Admitting: Certified Registered Nurse Anesthetist

## 2016-11-20 ENCOUNTER — Ambulatory Visit (INDEPENDENT_AMBULATORY_CARE_PROVIDER_SITE_OTHER): Payer: Medicare Other | Admitting: *Deleted

## 2016-11-20 ENCOUNTER — Encounter (HOSPITAL_COMMUNITY): Payer: Self-pay | Admitting: *Deleted

## 2016-11-20 ENCOUNTER — Encounter (HOSPITAL_COMMUNITY)
Admission: RE | Disposition: A | Discharge status: Home / Self Care | Payer: Self-pay | Source: Ambulatory Visit | Attending: Internal Medicine

## 2016-11-20 ENCOUNTER — Ambulatory Visit (HOSPITAL_COMMUNITY)
Admission: RE | Admit: 2016-11-20 | Discharge: 2016-11-20 | Disposition: A | Discharge status: Home / Self Care | Payer: Medicare Other | Source: Ambulatory Visit | Attending: Internal Medicine | Admitting: Internal Medicine

## 2016-11-20 DIAGNOSIS — R0602 Shortness of breath: Secondary | ICD-10-CM | POA: Diagnosis not present

## 2016-11-20 DIAGNOSIS — I4819 Other persistent atrial fibrillation: Secondary | ICD-10-CM

## 2016-11-20 DIAGNOSIS — G4733 Obstructive sleep apnea (adult) (pediatric): Secondary | ICD-10-CM | POA: Insufficient documentation

## 2016-11-20 DIAGNOSIS — I2510 Atherosclerotic heart disease of native coronary artery without angina pectoris: Secondary | ICD-10-CM | POA: Diagnosis not present

## 2016-11-20 DIAGNOSIS — Z79899 Other long term (current) drug therapy: Secondary | ICD-10-CM | POA: Insufficient documentation

## 2016-11-20 DIAGNOSIS — Z6830 Body mass index (BMI) 30.0-30.9, adult: Secondary | ICD-10-CM | POA: Diagnosis not present

## 2016-11-20 DIAGNOSIS — I350 Nonrheumatic aortic (valve) stenosis: Secondary | ICD-10-CM

## 2016-11-20 DIAGNOSIS — Z952 Presence of prosthetic heart valve: Secondary | ICD-10-CM | POA: Diagnosis not present

## 2016-11-20 DIAGNOSIS — Z7901 Long term (current) use of anticoagulants: Secondary | ICD-10-CM | POA: Insufficient documentation

## 2016-11-20 DIAGNOSIS — J449 Chronic obstructive pulmonary disease, unspecified: Secondary | ICD-10-CM | POA: Insufficient documentation

## 2016-11-20 DIAGNOSIS — I481 Persistent atrial fibrillation: Secondary | ICD-10-CM

## 2016-11-20 DIAGNOSIS — Z87891 Personal history of nicotine dependence: Secondary | ICD-10-CM | POA: Insufficient documentation

## 2016-11-20 DIAGNOSIS — Z7982 Long term (current) use of aspirin: Secondary | ICD-10-CM | POA: Diagnosis not present

## 2016-11-20 HISTORY — PX: CARDIOVERSION: SHX1299

## 2016-11-20 LAB — POCT INR: INR: 3.4

## 2016-11-20 SURGERY — CARDIOVERSION
Anesthesia: Monitor Anesthesia Care

## 2016-11-20 MED ORDER — LIDOCAINE HCL (CARDIAC) 20 MG/ML IV SOLN
INTRAVENOUS | Status: DC | PRN
  Administered 2016-11-20: 100 mg via INTRAVENOUS

## 2016-11-20 MED ORDER — SODIUM CHLORIDE 0.9 % IV SOLN
INTRAVENOUS | Status: DC
  Administered 2016-11-20: 12:00:00 via INTRAVENOUS

## 2016-11-20 MED ORDER — PROPOFOL 10 MG/ML IV BOLUS
INTRAVENOUS | Status: DC | PRN
  Administered 2016-11-20: 80 mg via INTRAVENOUS

## 2016-11-20 NOTE — Discharge Instructions (Signed)
Electrical Cardioversion, Care After °This sheet gives you information about how to care for yourself after your procedure. Your health care provider may also give you more specific instructions. If you have problems or questions, contact your health care provider. °What can I expect after the procedure? °After the procedure, it is common to have: °· Some redness on the skin where the shocks were given. °Follow these instructions at home: °· Do not drive for 24 hours if you were given a medicine to help you relax (sedative). °· Take over-the-counter and prescription medicines only as told by your health care provider. °· Ask your health care provider how to check your pulse. Check it often. °· Rest for 48 hours after the procedure or as told by your health care provider. °· Avoid or limit your caffeine use as told by your health care provider. °Contact a health care provider if: °· You feel like your heart is beating too quickly or your pulse is not regular. °· You have a serious muscle cramp that does not go away. °Get help right away if: °· You have discomfort in your chest. °· You are dizzy or you feel faint. °· You have trouble breathing or you are short of breath. °· Your speech is slurred. °· You have trouble moving an arm or leg on one side of your body. °· Your fingers or toes turn cold or blue. °This information is not intended to replace advice given to you by your health care provider. Make sure you discuss any questions you have with your health care provider. °Document Released: 08/03/2013 Document Revised: 05/16/2016 Document Reviewed: 04/18/2016 °Elsevier Interactive Patient Education © 2017 [employer]vier Inc[employer] ° °

## 2016-11-20 NOTE — Op Note (Signed)
Patient anesthetized by anesthesia With pads in AP position patient caridoverted to SR with 200 J synchronized biphasix energy Pacer interrogated, working normally.

## 2016-11-20 NOTE — Anesthesia Preprocedure Evaluation (Signed)
Anesthesia Evaluation  Patient identified by MRN, date of birth, ID band Patient awake    Reviewed: Allergy & Precautions, NPO status , Patient's Chart, lab work & pertinent test results  Airway Mallampati: I  TM Distance: >3 FB Neck ROM: Full    Dental   Pulmonary sleep apnea , former smoker,    Pulmonary exam normal        Cardiovascular + CAD  Normal cardiovascular exam+ dysrhythmias Atrial Fibrillation      Neuro/Psych    GI/Hepatic   Endo/Other    Renal/GU      Musculoskeletal   Abdominal   Peds  Hematology   Anesthesia Other Findings   Reproductive/Obstetrics                             Anesthesia Physical Anesthesia Plan  ASA: III  Anesthesia Plan: General   Post-op Pain Management:    Induction: Intravenous  Airway Management Planned: Mask  Additional Equipment:   Intra-op Plan:   Post-operative Plan: Extubation in OR  Informed Consent: I have reviewed the patients History and Physical, chart, labs and discussed the procedure including the risks, benefits and alternatives for the proposed anesthesia with the patient or authorized representative who has indicated his/her understanding and acceptance.     Plan Discussed with: CRNA and Surgeon  Anesthesia Plan Comments:         Anesthesia Quick Evaluation

## 2016-11-20 NOTE — Anesthesia Postprocedure Evaluation (Signed)
Anesthesia Post Note  Patient: Alex DuhamelJohn R [name]  Procedure(s) Performed: Procedure(s) (LRB): CARDIOVERSION (N/A)  Patient location during evaluation: PACU Anesthesia Type: General Level of consciousness: awake and alert Pain management: pain level controlled Vital Signs Assessment: post-procedure vital signs reviewed and stable Respiratory status: spontaneous breathing, nonlabored ventilation, respiratory function stable and patient connected to nasal cannula oxygen Cardiovascular status: blood pressure returned to baseline and stable Postop Assessment: no signs of nausea or vomiting Anesthetic complications: no       Last Vitals:  Vitals:   11/20/16 1220 11/20/16 1230  BP: 126/73 (!) 142/68  Pulse: 63 65  Resp: 19 (!) 25  Temp:      Last Pain:  Vitals:   11/20/16 1206  TempSrc: Oral                 [name] DAVID

## 2016-11-20 NOTE — Transfer of Care (Signed)
Immediate Anesthesia Transfer of Care Note  Patient: Alex DuhamelJohn R [name]  Procedure(s) Performed: Procedure(s): CARDIOVERSION (N/A)  Patient Location: Endoscopy Unit  Anesthesia Type:General  Level of Consciousness: awake and alert   Airway & Oxygen Therapy: Patient Spontanous Breathing and Patient connected to nasal cannula oxygen  Post-op Assessment: Report given to RN and Post -op Vital signs reviewed and stable  Post vital signs: Reviewed and stable  Last Vitals:  Vitals:   11/20/16 1204 11/20/16 1205  BP:    Pulse: 65 64  Resp: (!) 21 19  Temp:      Last Pain:  Vitals:   11/20/16 1028  TempSrc: Oral         Complications: No apparent anesthesia complications

## 2016-11-21 ENCOUNTER — Encounter (HOSPITAL_COMMUNITY): Payer: Self-pay | Admitting: Internal Medicine

## 2016-11-23 NOTE — H&P (Signed)
    Primary Physician: Primary Cardiologist:  Allred  HPI:  Pt is an 83 yo who has a history of persistent atrial fibrillation  Followed in clinic [facility] history of AVR, CAD, COPD, Morbid obesity Presents for cardioversion.    Past Medical History:  Diagnosis Date  . Aortic stenosis    s/p AVR by Dr Laneta SimmersBartle  . COPD (chronic obstructive pulmonary disease) (HCC)   . History of coronary artery disease    status post stenting of the marginal circumflex in 12/2003 and again in 2009  . Hyperlipidemia   . Morbid obesity (HCC)    weight 243 pounds, BMI 31.2kg/m2, BSA 2.36 square meters  . Obstructive sleep apnea    compliant with CPAP  . Persistent atrial fibrillation (HCC)     No prescriptions prior to admission.       Infusions:   No Known Allergies  Social History   Social History  . Marital status: Married    Spouse name: N/A  . Number of children: N/A  . Years of education: N/A   Occupational History  . Not on file.   Social History Main Topics  . Smoking status: Former Smoker    Packs/day: 0.50    Years: 30.00    Types: Cigarettes    Start date: 10/27/1973    Quit date: 10/28/2003  . Smokeless tobacco: Not on file  . Alcohol use 0.0 oz/week     Comment: 8 oz of wine per night  . Drug use: No  . Sexual activity: Not on file   Other Topics Concern  . Not on file   Social History Narrative   [place].  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    Family History  Problem Relation Age of Onset  . Alzheimer's disease Mother   . Tuberculosis Father   . Other Father     Spinal Meningitis    REVIEW OF SYSTEMS:  All systems reviewed  Negative to the above problem except as noted above.    PHYSICAL EXAM: Vitals:   11/20/16 1220 11/20/16 1230  BP: 126/73 (!) 142/68  Pulse: 63 65  Resp: 19 (!) 25  Temp:      No intake or output data in the 24 hours ending 11/23/16 1918  General:  Well appearing. No respiratory difficulty HEENT:  normal Neck: supple. no JVD. Carotids 2+ bilat; no bruits. No lymphadenopathy or thryomegaly appreciated. Cor: PMI nondisplaced. Regular rate & rhythm. No rubs, gallops or murmurs. Lungs: clear Abdomen: soft, nontender, nondistended. No hepatosplenomegaly. No bruits or masses. Good bowel sounds. Extremities: no cyanosis, clubbing, rash, edema Neuro: alert & oriented x 3, cranial nerves grossly intact. moves all 4 extremities w/o difficulty. Affect pleasant.     ASSESSMENT: Pt presents for elective cardioversion  He is an acceptable candidate. Risks / benefits described  Pt understands and agrees to proceed.  Dietrich PatesPaula Kingslee Mairena

## 2016-11-26 ENCOUNTER — Ambulatory Visit (HOSPITAL_COMMUNITY)
Admission: RE | Admit: 2016-11-26 | Discharge: 2016-11-26 | Disposition: A | Discharge status: Home / Self Care | Payer: Medicare Other | Source: Ambulatory Visit | Attending: Nurse Practitioner | Admitting: Nurse Practitioner

## 2016-11-26 ENCOUNTER — Encounter (HOSPITAL_COMMUNITY): Payer: Self-pay | Admitting: Nurse Practitioner

## 2016-11-26 VITALS — BP 162/90 | HR 85 | Temp 97.6°F | Ht 74.0 in | Wt 240.6 lb

## 2016-11-26 DIAGNOSIS — Z955 Presence of coronary angioplasty implant and graft: Secondary | ICD-10-CM | POA: Insufficient documentation

## 2016-11-26 DIAGNOSIS — Z7901 Long term (current) use of anticoagulants: Secondary | ICD-10-CM | POA: Insufficient documentation

## 2016-11-26 DIAGNOSIS — Z79899 Other long term (current) drug therapy: Secondary | ICD-10-CM | POA: Insufficient documentation

## 2016-11-26 DIAGNOSIS — Z952 Presence of prosthetic heart valve: Secondary | ICD-10-CM | POA: Diagnosis not present

## 2016-11-26 DIAGNOSIS — I5032 Chronic diastolic (congestive) heart failure: Secondary | ICD-10-CM | POA: Diagnosis not present

## 2016-11-26 DIAGNOSIS — I2510 Atherosclerotic heart disease of native coronary artery without angina pectoris: Secondary | ICD-10-CM | POA: Diagnosis not present

## 2016-11-26 DIAGNOSIS — I4892 Unspecified atrial flutter: Secondary | ICD-10-CM | POA: Insufficient documentation

## 2016-11-26 DIAGNOSIS — E785 Hyperlipidemia, unspecified: Secondary | ICD-10-CM | POA: Diagnosis not present

## 2016-11-26 DIAGNOSIS — J449 Chronic obstructive pulmonary disease, unspecified: Secondary | ICD-10-CM | POA: Insufficient documentation

## 2016-11-26 DIAGNOSIS — Z951 Presence of aortocoronary bypass graft: Secondary | ICD-10-CM | POA: Diagnosis not present

## 2016-11-26 DIAGNOSIS — I481 Persistent atrial fibrillation: Secondary | ICD-10-CM

## 2016-11-26 DIAGNOSIS — Z96653 Presence of artificial knee joint, bilateral: Secondary | ICD-10-CM | POA: Diagnosis not present

## 2016-11-26 DIAGNOSIS — G4733 Obstructive sleep apnea (adult) (pediatric): Secondary | ICD-10-CM | POA: Insufficient documentation

## 2016-11-26 DIAGNOSIS — I4891 Unspecified atrial fibrillation: Secondary | ICD-10-CM | POA: Diagnosis not present

## 2016-11-26 DIAGNOSIS — Z950 Presence of cardiac pacemaker: Secondary | ICD-10-CM | POA: Insufficient documentation

## 2016-11-26 DIAGNOSIS — I4819 Other persistent atrial fibrillation: Secondary | ICD-10-CM

## 2016-11-26 DIAGNOSIS — Z7982 Long term (current) use of aspirin: Secondary | ICD-10-CM | POA: Insufficient documentation

## 2016-11-26 DIAGNOSIS — Z87891 Personal history of nicotine dependence: Secondary | ICD-10-CM | POA: Diagnosis not present

## 2016-11-26 MED ORDER — FUROSEMIDE 20 MG PO TABS: ORAL_TABLET | ORAL | 6 refills | Status: DC

## 2016-11-26 MED ORDER — POTASSIUM CHLORIDE ER 10 MEQ PO TBCR: EXTENDED_RELEASE_TABLET | ORAL | 3 refills | Status: DC

## 2016-11-26 NOTE — Progress Notes (Signed)
Primary Care Physician: [name], MD Referring Physician: Dr. Meyer Russel is a 83 y.o. male with a h/o CAD, s/p AVR, COPD, OSA with cpap, PPM, that asked to be seen in the afib clinic urgently for not feeling well, more short of breath with exertion that he first noticed about two weeks ago but has gotten worse over the last few days. Device interrogated by Noreene Larsson and shows afib/flutter with controlled v rates since 1/2-1/3. Fluid status stable. No PND/orthopnea. States no change in his heatlh. No triggers. No change in meds. Continues on sotalol and warfarin. INR last week at 2.2. His wife has just gotten out of rehab due to fx of pelvis.  He had cardioversion 11/20/16 and called the office today for continuation of fatigue and shortness of breath. By his scales and our weights here he has gained approximately 7 lbs.He has not felt any different after cardioversion.  Device interrogated and he is in atrial flutter and only stayed in normal rhythm for one day.  Today, he denies symptoms of palpitations, chest pain,orthopnea, PND, lower extremity edema, dizziness, presyncope, syncope, or neurologic sequela. Positive for fatigue and shortness of breath with exertion.The patient is tolerating medications without difficulties and is otherwise without complaint today.   Past Medical History:  Diagnosis Date  . Aortic stenosis    s/p AVR by Dr Laneta Simmers  . COPD (chronic obstructive pulmonary disease) (HCC)   . History of coronary artery disease    status post stenting of the marginal circumflex in 12/2003 and again in 2009  . Hyperlipidemia   . Morbid obesity (HCC)    weight 243 pounds, BMI 31.2kg/m2, BSA 2.36 square meters  . Obstructive sleep apnea    compliant with CPAP  . Persistent atrial fibrillation [facility])    Past Surgical History:  Procedure Laterality Date  . AORTIC VALVE REPLACEMENT (AVR)/CORONARY ARTERY BYPASS GRAFTING (CABG)   08/05/2011   LIMA to LAD, sequential  saphenous vein graft to third and fourth obtuse marginal branches of the circumflex, aortic valve replacement using a 23 mm Edwards pericardial valve  . APPENDECTOMY    . CARDIOVERSION N/A 11/14/2014   Procedure: CARDIOVERSION;  Surgeon: Donato Schultz, MD;  Location: [facility] ENDOSCOPY;  Service: Cardiovascular;  Laterality: N/A;  . CARDIOVERSION N/A 11/20/2016   Procedure: CARDIOVERSION;  Surgeon: Pricilla Riffle, MD;  Location: [facility] ENDOSCOPY;  Service: Cardiovascular;  Laterality: N/A;  . CAROTID ENDARTERECTOMY     Dr Lollie Sails  . EP IMPLANTABLE DEVICE N/A 11/06/2015   Procedure: PPM Generator Changeout;for sick sinus syndrome with a MDT Adapta L PPM, chronically elevated RV threshold.  . permanent pacemaker     MDT EnRhythm implanted by Dr Lawanda Cousins in [place] for complete heart block with syncope  . REPLACEMENT TOTAL KNEE BILATERAL     2006  . TEE WITHOUT CARDIOVERSION N/A 11/14/2014   Procedure: TRANSESOPHAGEAL ECHOCARDIOGRAM (TEE);  Surgeon: Donato Schultz, MD;  Location: [facility] ENDOSCOPY;  Service: Cardiovascular;  Laterality: N/A;    Current Outpatient Prescriptions  Medication Sig Dispense Refill  . aspirin 81 MG tablet Take 81 mg by mouth daily.    Marland Kitchen atorvastatin (LIPITOR) 80 MG tablet TAKE 1 TABLET BY MOUTH DAILY 90 tablet 3  . B Complex Vitamins (VITAMIN B COMPLEX PO) Take 1 tablet by mouth daily.     Marland Kitchen escitalopram (LEXAPRO) 10 MG tablet Take 10 mg by mouth at bedtime.    . furosemide (LASIX) 20 MG tablet TAKE 1 TABLET BY  MOUTH DAILY 30 tablet 8  . HYDROcodone-acetaminophen (NORCO/VICODIN) 5-325 MG tablet Take 1 tablet by mouth 2 (two) times daily as needed for moderate pain.     Marland Kitchen. ibuprofen (ADVIL,MOTRIN) 200 MG tablet Take 400 mg by mouth daily as needed for moderate pain.    . Magnesium 250 MG TABS Take 1 tablet (250 mg total) by mouth daily.  0  . metoprolol succinate (TOPROL-XL) 25 MG 24 hr tablet Take 12.5 mg by mouth 2 (two) times daily.     . potassium chloride (K-DUR) 10 MEQ tablet  TAKE 1 TABLET BY MOUTH DAILY 90 tablet 0  . sotalol (BETAPACE) 80 MG tablet TAKE 1 TABLET(80 MG) BY MOUTH TWICE DAILY 60 tablet 6  . warfarin (COUMADIN) 2.5 MG tablet Take as directed by coumadin clinic 40 tablet 1  . zolpidem (AMBIEN) 10 MG tablet Take 5-10 mg by mouth at bedtime.      No current facility-administered medications for this encounter.     No Known Allergies  Social History   Social History  . Marital status: Married    Spouse name: N/A  . Number of children: N/A  . Years of education: N/A   Occupational History  . Not on file.   Social History Main Topics  . Smoking status: Former Smoker    Packs/day: 0.50    Years: 30.00    Types: Cigarettes    Start date: 10/27/1973    Quit date: 10/28/2003  . Smokeless tobacco: Never Used  . Alcohol use 0.0 oz/week     Comment: 8 oz of wine per night  . Drug use: No  . Sexual activity: Not on file   Other Topics Concern  . Not on file   Social History Narrative   [place].  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    Family History  Problem Relation Age of Onset  . Alzheimer's disease Mother   . Tuberculosis Father   . Other Father     Spinal Meningitis    ROS- All systems are reviewed and negative except as per the HPI above  Physical Exam: Vitals:   11/26/16 1328  BP: (!) 162/90  Pulse: 85  Temp: 97.6 F (36.4 C)  TempSrc: Oral  SpO2: 96%  Weight: 240 lb 9.6 oz (109.1 kg)  Height: 6\' 2"  (1.88 m)   Wt Readings from Last 3 Encounters:  11/26/16 240 lb 9.6 oz (109.1 kg)  11/05/16 234 lb 12.8 oz (106.5 kg)  11/03/16 233 lb (105.7 kg)    Labs: Lab Results  Component Value Date   NA 142 11/14/2016   K 4.2 11/14/2016   CL 98 11/14/2016   CO2 31 (H) 11/14/2016   GLUCOSE 74 11/14/2016   BUN 23 11/14/2016   CREATININE 0.96 11/14/2016   CALCIUM 8.7 11/14/2016   MG 1.6 (L) 11/03/2016   Lab Results  Component Value Date   INR 3.4 11/20/2016   No results found for: CHOL,  HDL, LDLCALC, TRIG   GEN- The patient is well appearing, alert and oriented x 3 today.   Head- normocephalic, atraumatic Eyes-  Sclera clear, conjunctiva pink Ears- hearing intact Oropharynx- clear Neck- supple, no JVP Lymph- no cervical lymphadenopathy Lungs- Clear to ausculation bilaterally, normal work of breathing Heart- irregular rate and rhythm, no murmurs, rubs or gallops, PMI not laterally displaced GI- soft, NT, ND, + BS Extremities- no clubbing, cyanosis, or edema MS- no significant deformity or atrophy Skin- no rash or lesion  Psych- euthymic mood, full affect Neuro- strength and sensation are intact  EKG- shows a sensed v paced rhythm at 88 bpm Interrogation performed showing afib/flutter with SR only holding x one day after cardioversion Epic records reviewed    Assessment and Plan: 1. Symptomatic Afib/flutter Successful cardioversion with ERAF Will continue sotalol for now but per Dr. Jenel Lucks last note if burden increased, he would consider switching him to amiodarone  2. Diastolic heart failure with shortness of breath Weight is up 7 lbs over the last 2 weeks Increase lasix to 20 mg bid thru Saturday Weigh daily  F/u with Dr. Johney Frame 1/29  [facility] Sidle. [facility] [address] [place], [place] [address] [phone]

## 2016-11-26 NOTE — Patient Instructions (Addendum)
Your physician has recommended you make the following change in your medication:  1)Take 40mg  of lasix when you get home  --- Thursday, Friday and Saturday --- take 20mg  of lasix twice a day  --- On Sunday return to 20mg  of lasix once a day  2)increase potassium to a full tablet while increasing lasix

## 2016-11-27 ENCOUNTER — Ambulatory Visit (INDEPENDENT_AMBULATORY_CARE_PROVIDER_SITE_OTHER): Payer: Medicare Other | Admitting: *Deleted

## 2016-11-27 DIAGNOSIS — I350 Nonrheumatic aortic (valve) stenosis: Secondary | ICD-10-CM | POA: Diagnosis not present

## 2016-11-27 DIAGNOSIS — R0602 Shortness of breath: Secondary | ICD-10-CM

## 2016-11-27 DIAGNOSIS — I4819 Other persistent atrial fibrillation: Secondary | ICD-10-CM

## 2016-11-27 DIAGNOSIS — I481 Persistent atrial fibrillation: Secondary | ICD-10-CM | POA: Diagnosis not present

## 2016-11-27 LAB — POCT INR: INR: 2.6

## 2016-12-01 ENCOUNTER — Ambulatory Visit (HOSPITAL_COMMUNITY)
Admission: RE | Admit: 2016-12-01 | Discharge: 2016-12-01 | Disposition: A | Discharge status: Home / Self Care | Payer: Medicare Other | Source: Ambulatory Visit | Attending: Internal Medicine | Admitting: Internal Medicine

## 2016-12-01 ENCOUNTER — Ambulatory Visit (HOSPITAL_COMMUNITY)
Admission: RE | Admit: 2016-12-01 | Discharge: 2016-12-01 | Disposition: A | Discharge status: Home / Self Care | Payer: Medicare Other | Source: Ambulatory Visit | Attending: Nurse Practitioner | Admitting: Nurse Practitioner

## 2016-12-01 ENCOUNTER — Encounter (HOSPITAL_COMMUNITY): Payer: Self-pay | Admitting: Nurse Practitioner

## 2016-12-01 VITALS — BP 110/62 | HR 76 | Ht 74.0 in | Wt 232.0 lb

## 2016-12-01 DIAGNOSIS — Z87891 Personal history of nicotine dependence: Secondary | ICD-10-CM | POA: Diagnosis not present

## 2016-12-01 DIAGNOSIS — G4733 Obstructive sleep apnea (adult) (pediatric): Secondary | ICD-10-CM | POA: Diagnosis not present

## 2016-12-01 DIAGNOSIS — Z7982 Long term (current) use of aspirin: Secondary | ICD-10-CM | POA: Insufficient documentation

## 2016-12-01 DIAGNOSIS — I4891 Unspecified atrial fibrillation: Secondary | ICD-10-CM | POA: Diagnosis present

## 2016-12-01 DIAGNOSIS — R0602 Shortness of breath: Secondary | ICD-10-CM

## 2016-12-01 DIAGNOSIS — R0600 Dyspnea, unspecified: Secondary | ICD-10-CM

## 2016-12-01 DIAGNOSIS — I4819 Other persistent atrial fibrillation: Secondary | ICD-10-CM

## 2016-12-01 DIAGNOSIS — J449 Chronic obstructive pulmonary disease, unspecified: Secondary | ICD-10-CM | POA: Diagnosis not present

## 2016-12-01 DIAGNOSIS — I484 Atypical atrial flutter: Secondary | ICD-10-CM | POA: Diagnosis not present

## 2016-12-01 DIAGNOSIS — Z7901 Long term (current) use of anticoagulants: Secondary | ICD-10-CM | POA: Diagnosis not present

## 2016-12-01 DIAGNOSIS — I700 Atherosclerosis of aorta: Secondary | ICD-10-CM

## 2016-12-01 DIAGNOSIS — Z79899 Other long term (current) drug therapy: Secondary | ICD-10-CM | POA: Insufficient documentation

## 2016-12-01 DIAGNOSIS — Z96653 Presence of artificial knee joint, bilateral: Secondary | ICD-10-CM | POA: Diagnosis not present

## 2016-12-01 DIAGNOSIS — Z955 Presence of coronary angioplasty implant and graft: Secondary | ICD-10-CM | POA: Insufficient documentation

## 2016-12-01 DIAGNOSIS — Z951 Presence of aortocoronary bypass graft: Secondary | ICD-10-CM | POA: Insufficient documentation

## 2016-12-01 DIAGNOSIS — Z950 Presence of cardiac pacemaker: Secondary | ICD-10-CM

## 2016-12-01 DIAGNOSIS — Z952 Presence of prosthetic heart valve: Secondary | ICD-10-CM | POA: Diagnosis not present

## 2016-12-01 DIAGNOSIS — J90 Pleural effusion, not elsewhere classified: Secondary | ICD-10-CM

## 2016-12-01 DIAGNOSIS — I481 Persistent atrial fibrillation: Secondary | ICD-10-CM | POA: Diagnosis not present

## 2016-12-01 DIAGNOSIS — E785 Hyperlipidemia, unspecified: Secondary | ICD-10-CM | POA: Diagnosis not present

## 2016-12-01 DIAGNOSIS — I495 Sick sinus syndrome: Secondary | ICD-10-CM | POA: Diagnosis not present

## 2016-12-01 DIAGNOSIS — I2510 Atherosclerotic heart disease of native coronary artery without angina pectoris: Secondary | ICD-10-CM | POA: Diagnosis not present

## 2016-12-01 LAB — HEPATIC FUNCTION PANEL
ALT: 18 U/L (ref 17–63)
AST: 31 U/L (ref 15–41)
Albumin: 3.6 g/dL (ref 3.5–5.0)
Alkaline Phosphatase: 57 U/L (ref 38–126)
BILIRUBIN DIRECT: 0.2 mg/dL (ref 0.1–0.5)
BILIRUBIN INDIRECT: 0.6 mg/dL (ref 0.3–0.9)
BILIRUBIN TOTAL: 0.8 mg/dL (ref 0.3–1.2)
Total Protein: 6.4 g/dL — ABNORMAL LOW (ref 6.5–8.1)

## 2016-12-01 MED ORDER — AMIODARONE HCL 200 MG PO TABS: 200.0000 mg | ORAL_TABLET | Freq: Two times a day (BID) | ORAL | 1 refills | Status: DC

## 2016-12-01 NOTE — Patient Instructions (Signed)
Your physician has recommended you make the following change in your medication:  1)STOP sotalol  2)On Wednesday, February 7th evening start Amiodarone 200mg  -- you will take this twice a day   The coumadin clinic will be in touch with you to follow up on coumadin closely while loading on amiodarone

## 2016-12-01 NOTE — Progress Notes (Signed)
Electrophysiology Office Note   Date:  12/01/2016   ID:  Alex DuhamelJohn R Mondo, DOB 01/08/1931, MRN [id number]  PCP:  Elijio MilesWEAVER,Gildardo W, MD  Cardiologist:  Dr Excell Seltzerooper  CC: afib/ atrial flutter   History of Present Illness: Alex [name] is a 83 y.o. male who presents today for EP followup in the AF clinic.  Unfortunately, recent cardioversion resulted in sinus rhythm for only about 24 hours before returning to an atypical atrial flutter.  He continues to have fatigue and very limited exercise tolerance.  He reports SOB with walking across the floor which is new. Today, he denies symptoms of  chest pain,  lower extremity edema, claudication, dizziness, presyncope, syncope, bleeding, or neurologic sequela. The patient is tolerating medications without difficulties and is otherwise without complaint today.   Past Medical History:  Diagnosis Date  . Aortic stenosis    s/p AVR by Dr Laneta SimmersBartle  . COPD (chronic obstructive pulmonary disease) (HCC)   . History of coronary artery disease    status post stenting of the marginal circumflex in 12/2003 and again in 2009  . Hyperlipidemia   . Morbid obesity (HCC)    weight 243 pounds, BMI 31.2kg/m2, BSA 2.36 square meters  . Obstructive sleep apnea    compliant with CPAP  . Persistent atrial fibrillation [facility](HCC)    Past Surgical History:  Procedure Laterality Date  . AORTIC VALVE REPLACEMENT (AVR)/CORONARY ARTERY BYPASS GRAFTING (CABG)   08/05/2011   LIMA to LAD, sequential saphenous vein graft to third and fourth obtuse marginal branches of the circumflex, aortic valve replacement using a 23 mm Edwards pericardial valve  . APPENDECTOMY    . CARDIOVERSION N/A 11/14/2014   Procedure: CARDIOVERSION;  Surgeon: Donato SchultzMark Skains, MD;  [facility];  Service: Cardiovascular;  Laterality: N/A;  . CARDIOVERSION N/A 11/20/2016   Procedure: CARDIOVERSION;  Surgeon: Pricilla RifflePaula Ross V, MD;  [facility];  Service: Cardiovascular;  Laterality: N/A;  . CAROTID  ENDARTERECTOMY     Dr Lollie Sailsale Williams  . EP IMPLANTABLE DEVICE N/A 11/06/2015   Procedure: PPM Generator Changeout;for sick sinus syndrome with a MDT Adapta L PPM, chronically elevated RV threshold.  . permanent pacemaker     MDT EnRhythm implanted by Dr Lawanda CousinsAl-Kori in [place] for complete heart block with syncope  . REPLACEMENT TOTAL KNEE BILATERAL     2006  . TEE WITHOUT CARDIOVERSION N/A 11/14/2014   Procedure: TRANSESOPHAGEAL ECHOCARDIOGRAM (TEE);  Surgeon: Donato SchultzMark Skains, MD;  [facility];  Service: Cardiovascular;  Laterality: N/A;     Current Outpatient Prescriptions  Medication Sig Dispense Refill  . aspirin 81 MG tablet Take 81 mg by mouth daily.    Marland Kitchen. atorvastatin (LIPITOR) 80 MG tablet TAKE 1 TABLET BY MOUTH DAILY 90 tablet 3  . B Complex Vitamins (VITAMIN B COMPLEX PO) Take 1 tablet by mouth daily.     Marland Kitchen. escitalopram (LEXAPRO) 10 MG tablet Take 10 mg by mouth at bedtime.    . furosemide (LASIX) 20 MG tablet Take 1 tablet (20mg ) by mouth daily. May take extra tablet for weight gain >3lbs 45 tablet 6  . HYDROcodone-acetaminophen (NORCO/VICODIN) 5-325 MG tablet Take 1 tablet by mouth 2 (two) times daily as needed for moderate pain.     Marland Kitchen. ibuprofen (ADVIL,MOTRIN) 200 MG tablet Take 400 mg by mouth daily as needed for moderate pain.    . Magnesium 250 MG TABS Take 1 tablet (250 mg total) by mouth daily.  0  . metoprolol succinate (TOPROL-XL)  25 MG 24 hr tablet Take 12.5 mg by mouth 2 (two) times daily.     . potassium chloride (K-DUR) 10 MEQ tablet Take 1 tablet ( ) by mouth daily. Take extra 1 tablet if taking extra dose of lasix 45 tablet 3  . sotalol (BETAPACE) 80 MG tablet TAKE 1 TABLET(80 MG) BY MOUTH TWICE DAILY 60 tablet 6  . warfarin (COUMADIN) 2.5 MG tablet Take as directed by coumadin clinic 40 tablet 1  . zolpidem (AMBIEN) 10 MG tablet Take 5-10 mg by mouth at bedtime.      No current facility-administered medications for this encounter.     Allergies:    Patient has no known allergies.   Social History:  The patient  reports that he quit smoking about 13 years ago. His smoking use included Cigarettes. He started smoking about 43 years ago. He has a 15.00 pack-year smoking history. He has never used smokeless tobacco. He reports that he drinks alcohol. He reports that he does not use drugs.   Family History:  The patient is unaware of any pertinent FH.  Denies HTN or DM in family   ROS:  Please see the history of present illness.   All other systems are reviewed and negative.    PHYSICAL EXAM: VS:  BP 110/62 (BP Location: Left Arm, Patient Position: Sitting, Cuff Size: Normal)   Pulse 76   Ht 6\' 2"  (1.88 m)   Wt 232 lb (105.2 kg)   BMI 29.79 kg/m  , BMI Body mass index is 29.79 kg/m. GEN: Well nourished, well developed, in no acute distress  HEENT: normal  Neck: no JVD, carotid bruits, or masses Cardiac: RRR (paced); no murmurs, rubs, or gallops,no edema  Respiratory:  clear to auscultation bilaterally, normal work of breathing GI: soft, nontender, nondistended, + BS MS: no deformity or atrophy  Skin: warm and dry, no rash, pacemaker pocket is well healed Neuro:  Strength and sensation are intact Psych: euthymic mood, full affect  Recent Labs: 11/03/2016: Hemoglobin 12.8; Magnesium 1.6; TSH 0.719 11/14/2016: BUN 23; Creatinine, Ser 0.96; Platelets 181; Potassium 4.2; Sodium 142  No results found for requested labs within last 8760 hours.     Estimated Creatinine Clearance: 75.4 mL/min (by C-G formula based on SCr of 0.96 mg/dL).   Wt Readings from Last 3 Encounters:  12/01/16 232 lb (105.2 kg)  11/26/16 240 lb 9.6 oz (109.1 kg)  11/05/16 234 lb 12.8 oz (106.5 kg)    ekg today reveals atypical atrial flutter with V pacing  ASSESSMENT AND PLAN:  1.  Persistent atrial fibrillation/ atypical atrial flutter The patient has symptomatic persistent atrial fibrillation.  He is currently in an atypical atrial flutter.  INRs have  been therapeutic.  He has failed medical therapy with sotalol. Therapeutic strategies for his atrial arrhythmias including rate control, a different AAD (amiodarone) and ablation were discussed in detail with the patient today. Risk, benefits, and alternatives to each approach were discussed.  EP study and radiofrequency ablation for afib was also discussed in detail today. These risks include but are not limited to stroke, bleeding, vascular damage, tamponade, perforation, damage to the esophagus, lungs, and other structures, pulmonary vein stenosis, worsening renal function, and death. The patient understands these risk and wishes to avoid ablation. I did discuss amiodarone and its risks at length today.  He is aware of risks of liver/thyroid/ occular/ pulmonary toxicity and even increased mortality with this medicine.  He accepts risks and wishes to proceed. LFTs today (  recent TSH was normal).  CXR today. Stop sotalol.  Start amiodarone 200mg  BID after adequate sotalol washout. Weekly INRs while starting amiodarone He will continue coumadin.  chads2vasc score is at least 3  2. SSS Pacemaker not interrogated today  RV pacing threshold is chronically elevated.  Given advanced age, we have opted to avoid lead revision. We will follow remotely  3. CAD Stable No change required today  Return to see me in 3-4 weeks.  If still in atypical atrial flutter, we may try pace termination in the office vs arranging cardioversion.   Current medicines are reviewed at length with the patient today.  The patient is without any concerns regarding medicines and no changes are made today.  Today, I have spent 40 minutes with the patient discussing atrial arrhythmias.  More than 50% of the visit time today was spent on this issue.    Disposition:   FU with Dr Excell Seltzer as scheduled carelink every 3 months Return to see me in 3-4 weeks   Signed, Hillis Range MD

## 2016-12-03 ENCOUNTER — Encounter (HOSPITAL_COMMUNITY): Payer: Self-pay | Admitting: Certified Registered Nurse Anesthetist

## 2016-12-03 ENCOUNTER — Inpatient Hospital Stay (HOSPITAL_BASED_OUTPATIENT_CLINIC_OR_DEPARTMENT_OTHER)
Admission: EM | Admit: 2016-12-03 | Discharge: 2016-12-11 | DRG: 086 | Disposition: A | Discharge status: Rehabilitation Facility | Payer: Medicare Other | Attending: Neurological Surgery | Admitting: Neurological Surgery

## 2016-12-03 ENCOUNTER — Encounter (HOSPITAL_BASED_OUTPATIENT_CLINIC_OR_DEPARTMENT_OTHER): Payer: Self-pay | Admitting: *Deleted

## 2016-12-03 ENCOUNTER — Inpatient Hospital Stay (HOSPITAL_COMMUNITY): Payer: Medicare Other

## 2016-12-03 ENCOUNTER — Emergency Department (HOSPITAL_BASED_OUTPATIENT_CLINIC_OR_DEPARTMENT_OTHER): Payer: Medicare Other

## 2016-12-03 ENCOUNTER — Encounter (HOSPITAL_COMMUNITY)
Admission: EM | Disposition: A | Discharge status: Rehabilitation Facility | Payer: Self-pay | Source: Home / Self Care | Attending: Neurological Surgery

## 2016-12-03 DIAGNOSIS — D539 Nutritional anemia, unspecified: Secondary | ICD-10-CM | POA: Diagnosis present

## 2016-12-03 DIAGNOSIS — N390 Urinary tract infection, site not specified: Secondary | ICD-10-CM | POA: Diagnosis not present

## 2016-12-03 DIAGNOSIS — S065X9A Traumatic subdural hemorrhage with loss of consciousness of unspecified duration, initial encounter: Secondary | ICD-10-CM

## 2016-12-03 DIAGNOSIS — I2510 Atherosclerotic heart disease of native coronary artery without angina pectoris: Secondary | ICD-10-CM | POA: Diagnosis present

## 2016-12-03 DIAGNOSIS — D72829 Elevated white blood cell count, unspecified: Secondary | ICD-10-CM | POA: Diagnosis not present

## 2016-12-03 DIAGNOSIS — R0603 Acute respiratory distress: Secondary | ICD-10-CM | POA: Diagnosis not present

## 2016-12-03 DIAGNOSIS — R791 Abnormal coagulation profile: Secondary | ICD-10-CM

## 2016-12-03 DIAGNOSIS — Z96653 Presence of artificial knee joint, bilateral: Secondary | ICD-10-CM | POA: Diagnosis present

## 2016-12-03 DIAGNOSIS — S065X0A Traumatic subdural hemorrhage without loss of consciousness, initial encounter: Principal | ICD-10-CM

## 2016-12-03 DIAGNOSIS — F05 Delirium due to known physiological condition: Secondary | ICD-10-CM | POA: Diagnosis not present

## 2016-12-03 DIAGNOSIS — R740 Nonspecific elevation of levels of transaminase and lactic acid dehydrogenase [LDH]: Secondary | ICD-10-CM | POA: Diagnosis not present

## 2016-12-03 DIAGNOSIS — G4700 Insomnia, unspecified: Secondary | ICD-10-CM | POA: Diagnosis present

## 2016-12-03 DIAGNOSIS — Z87891 Personal history of nicotine dependence: Secondary | ICD-10-CM

## 2016-12-03 DIAGNOSIS — G4710 Hypersomnia, unspecified: Secondary | ICD-10-CM | POA: Diagnosis not present

## 2016-12-03 DIAGNOSIS — K5900 Constipation, unspecified: Secondary | ICD-10-CM | POA: Diagnosis present

## 2016-12-03 DIAGNOSIS — J449 Chronic obstructive pulmonary disease, unspecified: Secondary | ICD-10-CM | POA: Diagnosis present

## 2016-12-03 DIAGNOSIS — Z6830 Body mass index (BMI) 30.0-30.9, adult: Secondary | ICD-10-CM

## 2016-12-03 DIAGNOSIS — W1830XA Fall on same level, unspecified, initial encounter: Secondary | ICD-10-CM | POA: Diagnosis present

## 2016-12-03 DIAGNOSIS — Z955 Presence of coronary angioplasty implant and graft: Secondary | ICD-10-CM

## 2016-12-03 DIAGNOSIS — R739 Hyperglycemia, unspecified: Secondary | ICD-10-CM | POA: Diagnosis present

## 2016-12-03 DIAGNOSIS — Z9989 Dependence on other enabling machines and devices: Secondary | ICD-10-CM | POA: Diagnosis not present

## 2016-12-03 DIAGNOSIS — R1310 Dysphagia, unspecified: Secondary | ICD-10-CM | POA: Diagnosis not present

## 2016-12-03 DIAGNOSIS — Y92007 Garden or yard of unspecified non-institutional (private) residence as the place of occurrence of the external cause: Secondary | ICD-10-CM | POA: Diagnosis not present

## 2016-12-03 DIAGNOSIS — Z66 Do not resuscitate: Secondary | ICD-10-CM | POA: Diagnosis not present

## 2016-12-03 DIAGNOSIS — G4733 Obstructive sleep apnea (adult) (pediatric): Secondary | ICD-10-CM | POA: Diagnosis present

## 2016-12-03 DIAGNOSIS — G40409 Other generalized epilepsy and epileptic syndromes, not intractable, without status epilepticus: Secondary | ICD-10-CM | POA: Diagnosis not present

## 2016-12-03 DIAGNOSIS — S065X0D Traumatic subdural hemorrhage without loss of consciousness, subsequent encounter: Secondary | ICD-10-CM | POA: Diagnosis not present

## 2016-12-03 DIAGNOSIS — Z7901 Long term (current) use of anticoagulants: Secondary | ICD-10-CM

## 2016-12-03 DIAGNOSIS — E785 Hyperlipidemia, unspecified: Secondary | ICD-10-CM | POA: Diagnosis not present

## 2016-12-03 DIAGNOSIS — R8290 Unspecified abnormal findings in urine: Secondary | ICD-10-CM | POA: Diagnosis not present

## 2016-12-03 DIAGNOSIS — E782 Mixed hyperlipidemia: Secondary | ICD-10-CM | POA: Diagnosis present

## 2016-12-03 DIAGNOSIS — S065X0S Traumatic subdural hemorrhage without loss of consciousness, sequela: Secondary | ICD-10-CM | POA: Diagnosis not present

## 2016-12-03 DIAGNOSIS — Z950 Presence of cardiac pacemaker: Secondary | ICD-10-CM

## 2016-12-03 DIAGNOSIS — R4182 Altered mental status, unspecified: Secondary | ICD-10-CM

## 2016-12-03 DIAGNOSIS — S0689AA Other specified intracranial injury with loss of consciousness status unknown, initial encounter: Secondary | ICD-10-CM | POA: Diagnosis present

## 2016-12-03 DIAGNOSIS — Z7982 Long term (current) use of aspirin: Secondary | ICD-10-CM

## 2016-12-03 DIAGNOSIS — I5033 Acute on chronic diastolic (congestive) heart failure: Secondary | ICD-10-CM | POA: Diagnosis not present

## 2016-12-03 DIAGNOSIS — F329 Major depressive disorder, single episode, unspecified: Secondary | ICD-10-CM | POA: Diagnosis present

## 2016-12-03 DIAGNOSIS — R4701 Aphasia: Secondary | ICD-10-CM | POA: Diagnosis not present

## 2016-12-03 DIAGNOSIS — I10 Essential (primary) hypertension: Secondary | ICD-10-CM | POA: Diagnosis present

## 2016-12-03 DIAGNOSIS — R1312 Dysphagia, oropharyngeal phase: Secondary | ICD-10-CM | POA: Diagnosis not present

## 2016-12-03 DIAGNOSIS — R001 Bradycardia, unspecified: Secondary | ICD-10-CM | POA: Diagnosis not present

## 2016-12-03 DIAGNOSIS — Z6831 Body mass index (BMI) 31.0-31.9, adult: Secondary | ICD-10-CM

## 2016-12-03 DIAGNOSIS — Z789 Other specified health status: Secondary | ICD-10-CM

## 2016-12-03 DIAGNOSIS — R079 Chest pain, unspecified: Secondary | ICD-10-CM | POA: Diagnosis present

## 2016-12-03 DIAGNOSIS — Z952 Presence of prosthetic heart valve: Secondary | ICD-10-CM

## 2016-12-03 DIAGNOSIS — F10239 Alcohol dependence with withdrawal, unspecified: Secondary | ICD-10-CM | POA: Diagnosis not present

## 2016-12-03 DIAGNOSIS — I484 Atypical atrial flutter: Secondary | ICD-10-CM | POA: Diagnosis not present

## 2016-12-03 DIAGNOSIS — E870 Hyperosmolality and hypernatremia: Secondary | ICD-10-CM | POA: Diagnosis not present

## 2016-12-03 DIAGNOSIS — J410 Simple chronic bronchitis: Secondary | ICD-10-CM | POA: Diagnosis not present

## 2016-12-03 DIAGNOSIS — E6609 Other obesity due to excess calories: Secondary | ICD-10-CM | POA: Diagnosis not present

## 2016-12-03 DIAGNOSIS — F1010 Alcohol abuse, uncomplicated: Secondary | ICD-10-CM

## 2016-12-03 DIAGNOSIS — I481 Persistent atrial fibrillation: Secondary | ICD-10-CM | POA: Diagnosis present

## 2016-12-03 DIAGNOSIS — I4892 Unspecified atrial flutter: Secondary | ICD-10-CM | POA: Diagnosis not present

## 2016-12-03 DIAGNOSIS — Z4659 Encounter for fitting and adjustment of other gastrointestinal appliance and device: Secondary | ICD-10-CM

## 2016-12-03 DIAGNOSIS — I6200 Nontraumatic subdural hemorrhage, unspecified: Secondary | ICD-10-CM | POA: Diagnosis not present

## 2016-12-03 DIAGNOSIS — R569 Unspecified convulsions: Secondary | ICD-10-CM | POA: Diagnosis not present

## 2016-12-03 DIAGNOSIS — J42 Unspecified chronic bronchitis: Secondary | ICD-10-CM | POA: Diagnosis not present

## 2016-12-03 DIAGNOSIS — R451 Restlessness and agitation: Secondary | ICD-10-CM

## 2016-12-03 DIAGNOSIS — I25810 Atherosclerosis of coronary artery bypass graft(s) without angina pectoris: Secondary | ICD-10-CM

## 2016-12-03 DIAGNOSIS — J810 Acute pulmonary edema: Secondary | ICD-10-CM | POA: Diagnosis not present

## 2016-12-03 DIAGNOSIS — S065XAA Traumatic subdural hemorrhage with loss of consciousness status unknown, initial encounter: Secondary | ICD-10-CM

## 2016-12-03 DIAGNOSIS — I482 Chronic atrial fibrillation: Secondary | ICD-10-CM | POA: Diagnosis not present

## 2016-12-03 DIAGNOSIS — G9340 Encephalopathy, unspecified: Secondary | ICD-10-CM | POA: Diagnosis not present

## 2016-12-03 DIAGNOSIS — Z953 Presence of xenogenic heart valve: Secondary | ICD-10-CM

## 2016-12-03 DIAGNOSIS — S069X2S Unspecified intracranial injury with loss of consciousness of 31 minutes to 59 minutes, sequela: Secondary | ICD-10-CM | POA: Diagnosis not present

## 2016-12-03 DIAGNOSIS — Z7289 Other problems related to lifestyle: Secondary | ICD-10-CM

## 2016-12-03 DIAGNOSIS — I5031 Acute diastolic (congestive) heart failure: Secondary | ICD-10-CM | POA: Diagnosis not present

## 2016-12-03 DIAGNOSIS — S065X2S Traumatic subdural hemorrhage with loss of consciousness of 31 minutes to 59 minutes, sequela: Secondary | ICD-10-CM | POA: Diagnosis not present

## 2016-12-03 DIAGNOSIS — R339 Retention of urine, unspecified: Secondary | ICD-10-CM | POA: Diagnosis not present

## 2016-12-03 DIAGNOSIS — R609 Edema, unspecified: Secondary | ICD-10-CM | POA: Diagnosis not present

## 2016-12-03 DIAGNOSIS — S065X3S Traumatic subdural hemorrhage with loss of consciousness of 1 hour to 5 hours 59 minutes, sequela: Secondary | ICD-10-CM | POA: Diagnosis not present

## 2016-12-03 DIAGNOSIS — E876 Hypokalemia: Secondary | ICD-10-CM | POA: Diagnosis not present

## 2016-12-03 DIAGNOSIS — Z951 Presence of aortocoronary bypass graft: Secondary | ICD-10-CM

## 2016-12-03 DIAGNOSIS — D696 Thrombocytopenia, unspecified: Secondary | ICD-10-CM

## 2016-12-03 DIAGNOSIS — Z9981 Dependence on supplemental oxygen: Secondary | ICD-10-CM

## 2016-12-03 DIAGNOSIS — A499 Bacterial infection, unspecified: Secondary | ICD-10-CM | POA: Diagnosis not present

## 2016-12-03 DIAGNOSIS — R0789 Other chest pain: Secondary | ICD-10-CM | POA: Diagnosis present

## 2016-12-03 DIAGNOSIS — S06369A Traumatic hemorrhage of cerebrum, unspecified, with loss of consciousness of unspecified duration, initial encounter: Secondary | ICD-10-CM

## 2016-12-03 DIAGNOSIS — I480 Paroxysmal atrial fibrillation: Secondary | ICD-10-CM | POA: Diagnosis not present

## 2016-12-03 DIAGNOSIS — R410 Disorientation, unspecified: Secondary | ICD-10-CM | POA: Diagnosis not present

## 2016-12-03 DIAGNOSIS — I4821 Permanent atrial fibrillation: Secondary | ICD-10-CM

## 2016-12-03 DIAGNOSIS — Z820 Family history of epilepsy and other diseases of the nervous system: Secondary | ICD-10-CM

## 2016-12-03 LAB — CBC WITH DIFFERENTIAL/PLATELET
BASOS PCT: 0 %
Basophils Absolute: 0 10*3/uL (ref 0.0–0.1)
EOS ABS: 0.1 10*3/uL (ref 0.0–0.7)
EOS PCT: 1 %
HCT: 38.1 % — ABNORMAL LOW (ref 39.0–52.0)
HEMOGLOBIN: 13.1 g/dL (ref 13.0–17.0)
LYMPHS ABS: 1 10*3/uL (ref 0.7–4.0)
Lymphocytes Relative: 10 %
MCH: 35.8 pg — AB (ref 26.0–34.0)
MCHC: 34.4 g/dL (ref 30.0–36.0)
MCV: 104.1 fL — ABNORMAL HIGH (ref 78.0–100.0)
MONOS PCT: 7 %
Monocytes Absolute: 0.7 10*3/uL (ref 0.1–1.0)
Neutro Abs: 8.4 10*3/uL — ABNORMAL HIGH (ref 1.7–7.7)
Neutrophils Relative %: 82 %
Platelets: 152 10*3/uL (ref 150–400)
RBC: 3.66 MIL/uL — ABNORMAL LOW (ref 4.22–5.81)
RDW: 13.3 % (ref 11.5–15.5)
WBC: 10.3 10*3/uL (ref 4.0–10.5)

## 2016-12-03 LAB — COMPREHENSIVE METABOLIC PANEL
ALBUMIN: 4 g/dL (ref 3.5–5.0)
ALK PHOS: 56 U/L (ref 38–126)
ALT: 19 U/L (ref 17–63)
ANION GAP: 10 (ref 5–15)
AST: 35 U/L (ref 15–41)
BUN: 30 mg/dL — ABNORMAL HIGH (ref 6–20)
CALCIUM: 9.1 mg/dL (ref 8.9–10.3)
CO2: 29 mmol/L (ref 22–32)
Chloride: 100 mmol/L — ABNORMAL LOW (ref 101–111)
Creatinine, Ser: 0.93 mg/dL (ref 0.61–1.24)
GFR calc non Af Amer: >60 mL/min (ref >60)
GLUCOSE: 124 mg/dL — AB (ref 65–99)
POTASSIUM: 3.8 mmol/L (ref 3.5–5.1)
SODIUM: 139 mmol/L (ref 135–145)
TOTAL PROTEIN: 7.3 g/dL (ref 6.5–8.1)
Total Bilirubin: 1.2 mg/dL (ref 0.3–1.2)

## 2016-12-03 LAB — PROTIME-INR
INR: 4.14 — AB
INR: 1.23
INR: 1.36
PROTHROMBIN TIME: 41.1 s — AB (ref 11.4–15.2)
PROTHROMBIN TIME: 16.9 s — AB (ref 11.4–15.2)
Prothrombin Time: 15.6 seconds — ABNORMAL HIGH (ref 11.4–15.2)

## 2016-12-03 LAB — TROPONIN I: Troponin I: <0.03 ng/mL (ref <0.03)

## 2016-12-03 LAB — BRAIN NATRIURETIC PEPTIDE: B NATRIURETIC PEPTIDE 5: 358.8 pg/mL — AB (ref 0.0–100.0)

## 2016-12-03 LAB — MRSA PCR SCREENING: MRSA BY PCR: NEGATIVE

## 2016-12-03 LAB — LIPASE, BLOOD: Lipase: 19 U/L (ref 11–51)

## 2016-12-03 SURGERY — CRANIOTOMY HEMATOMA EVACUATION SUBDURAL
Anesthesia: General | Laterality: Right

## 2016-12-03 MED ORDER — ORAL CARE MOUTH RINSE
15.0000 mL | Freq: Two times a day (BID) | OROMUCOSAL | Status: DC
  Administered 2016-12-03 – 2016-12-04 (×2): 15 mL via OROMUCOSAL

## 2016-12-03 MED ORDER — METOPROLOL SUCCINATE ER 25 MG PO TB24
12.5000 mg | ORAL_TABLET | Freq: Two times a day (BID) | ORAL | Status: DC
  Administered 2016-12-03: 12.5 mg via ORAL
  Filled 2016-12-03 (×2): qty 1

## 2016-12-03 MED ORDER — ONDANSETRON HCL 4 MG/2ML IJ SOLN
4.0000 mg | Freq: Once | INTRAMUSCULAR | Status: AC
  Administered 2016-12-03: 4 mg via INTRAVENOUS
  Filled 2016-12-03: qty 2

## 2016-12-03 MED ORDER — PANTOPRAZOLE SODIUM 40 MG IV SOLR
40.0000 mg | Freq: Every day | INTRAVENOUS | Status: DC
  Administered 2016-12-03 – 2016-12-09 (×7): 40 mg via INTRAVENOUS
  Filled 2016-12-03 (×7): qty 40

## 2016-12-03 MED ORDER — FENTANYL CITRATE (PF) 100 MCG/2ML IJ SOLN
INTRAMUSCULAR | Status: AC
  Filled 2016-12-03: qty 2

## 2016-12-03 MED ORDER — ACETAMINOPHEN 500 MG PO TABS
1000.0000 mg | ORAL_TABLET | Freq: Once | ORAL | Status: AC
  Administered 2016-12-03: 1000 mg via ORAL
  Filled 2016-12-03: qty 2

## 2016-12-03 MED ORDER — SODIUM CHLORIDE 0.9 % IV SOLN
INTRAVENOUS | Status: DC
  Administered 2016-12-03 – 2016-12-11 (×11): via INTRAVENOUS

## 2016-12-03 MED ORDER — ACETAMINOPHEN 650 MG RE SUPP
650.0000 mg | RECTAL | Status: DC | PRN
  Administered 2016-12-05: 650 mg via RECTAL
  Filled 2016-12-03: qty 1

## 2016-12-03 MED ORDER — AMIODARONE HCL 200 MG PO TABS
200.0000 mg | ORAL_TABLET | Freq: Two times a day (BID) | ORAL | Status: DC
  Administered 2016-12-03 – 2016-12-11 (×12): 200 mg via ORAL
  Filled 2016-12-03 (×12): qty 1

## 2016-12-03 MED ORDER — ESMOLOL HCL 100 MG/10ML IV SOLN
INTRAVENOUS | Status: AC
  Filled 2016-12-03: qty 10

## 2016-12-03 MED ORDER — LEVETIRACETAM 500 MG/5ML IV SOLN
500.0000 mg | Freq: Two times a day (BID) | INTRAVENOUS | Status: DC
  Administered 2016-12-03 – 2016-12-05 (×4): 500 mg via INTRAVENOUS
  Filled 2016-12-03 (×5): qty 5

## 2016-12-03 MED ORDER — FUROSEMIDE 20 MG PO TABS: 20.0000 mg | ORAL_TABLET | Freq: Every day | ORAL | Status: DC

## 2016-12-03 MED ORDER — LABETALOL HCL 5 MG/ML IV SOLN
10.0000 mg | INTRAVENOUS | Status: DC | PRN
  Administered 2016-12-03 – 2016-12-11 (×13): 10 mg via INTRAVENOUS
  Filled 2016-12-03 (×13): qty 4

## 2016-12-03 MED ORDER — DEXAMETHASONE SODIUM PHOSPHATE 4 MG/ML IJ SOLN
4.0000 mg | Freq: Four times a day (QID) | INTRAMUSCULAR | Status: AC
  Administered 2016-12-03 – 2016-12-04 (×4): 4 mg via INTRAVENOUS
  Filled 2016-12-03 (×4): qty 1

## 2016-12-03 MED ORDER — ACETAMINOPHEN-CODEINE #3 300-30 MG PO TABS
1.0000 | ORAL_TABLET | ORAL | Status: DC | PRN
  Administered 2016-12-08: 1 via ORAL
  Administered 2016-12-09 – 2016-12-11 (×8): 2 via ORAL
  Filled 2016-12-03: qty 2
  Filled 2016-12-03: qty 1
  Filled 2016-12-03 (×7): qty 2

## 2016-12-03 MED ORDER — ACETAMINOPHEN 325 MG PO TABS
650.0000 mg | ORAL_TABLET | ORAL | Status: DC | PRN
  Administered 2016-12-03: 650 mg via ORAL
  Filled 2016-12-03: qty 2

## 2016-12-03 MED ORDER — MORPHINE SULFATE (PF) 2 MG/ML IV SOLN
2.0000 mg | INTRAVENOUS | Status: DC | PRN
  Administered 2016-12-03 – 2016-12-04 (×4): 2 mg via INTRAVENOUS
  Filled 2016-12-03 (×4): qty 1

## 2016-12-03 MED ORDER — PROTHROMBIN COMPLEX CONC HUMAN 500 UNITS IV KIT
3500.0000 [IU] | PACK | Status: AC
  Administered 2016-12-03: 3500 [IU] via INTRAVENOUS
  Filled 2016-12-03: qty 140

## 2016-12-03 MED ORDER — MAGNESIUM 250 MG PO TABS: 250.0000 mg | ORAL_TABLET | Freq: Every day | ORAL | Status: DC

## 2016-12-03 MED ORDER — VITAMIN K1 10 MG/ML IJ SOLN
10.0000 mg | INTRAVENOUS | Status: AC
  Administered 2016-12-03: 10 mg via INTRAVENOUS
  Filled 2016-12-03: qty 1

## 2016-12-03 MED ORDER — ESCITALOPRAM OXALATE 10 MG PO TABS
10.0000 mg | ORAL_TABLET | Freq: Every day | ORAL | Status: DC
  Administered 2016-12-03: 10 mg via ORAL
  Filled 2016-12-03: qty 1

## 2016-12-03 MED ORDER — THIAMINE HCL 100 MG/ML IJ SOLN
Freq: Once | INTRAVENOUS | Status: AC
  Administered 2016-12-03: 20:00:00 via INTRAVENOUS
  Filled 2016-12-03: qty 1000

## 2016-12-03 MED ORDER — POTASSIUM CHLORIDE CRYS ER 10 MEQ PO TBCR
10.0000 meq | EXTENDED_RELEASE_TABLET | Freq: Every day | ORAL | Status: DC
  Administered 2016-12-06: 10 meq via ORAL
  Filled 2016-12-03 (×6): qty 1

## 2016-12-03 MED ORDER — ASPIRIN 81 MG PO CHEW
324.0000 mg | CHEWABLE_TABLET | Freq: Once | ORAL | Status: AC
  Administered 2016-12-03: 324 mg via ORAL
  Filled 2016-12-03: qty 4

## 2016-12-03 MED ORDER — VITAMIN K1 10 MG/ML IJ SOLN
INTRAMUSCULAR | Status: AC
  Filled 2016-12-03: qty 1

## 2016-12-03 MED ORDER — ACETAMINOPHEN 160 MG/5ML PO SOLN: 650.0000 mg | ORAL | Status: DC | PRN

## 2016-12-03 MED ORDER — SENNOSIDES-DOCUSATE SODIUM 8.6-50 MG PO TABS
1.0000 | ORAL_TABLET | Freq: Two times a day (BID) | ORAL | Status: DC
  Administered 2016-12-03 – 2016-12-11 (×11): 1 via ORAL
  Filled 2016-12-03 (×11): qty 1

## 2016-12-03 NOTE — ED Notes (Signed)
Family visiting at bedside, Patient awake, talking and joking

## 2016-12-03 NOTE — ED Provider Notes (Signed)
[facility] Provider Note   CSN: [id number] Arrival date & time: 12/03/16  4098     History   Chief Complaint Chief Complaint  Patient presents with  . Chest Pain    HPI Alex [name] is a 83 y.o. male.  HPI 83 year old male who presents with chest pain. History of AS s/p prosthetic AVR, COPD on night time O2, HLD, persistent atrial fibrillation on coumadin, SSS s/p PPM, and CAD s/p CABG. States that he had a mechanical fall 2 nights ago where he hit his head, but did not have loss of consciousness and was doing fine. Yesterday evening developed dry heaving, gradual onset moderate squeezing chest pain, shortness of breath. Has been persistent throughout the night into this morning. No alleviating or aggravating factors. Her husband states that he has been stumbling tonight, but he denies any vision or speech changes, confusion, focal numbness or weakness. Has not been recently ill with fever, cough, flu or pneumonia symptoms. Denies any diarrhea or constipation or abdominal pain. No urinary complaints. No lower extremity edema , orthopnea or PND.   Past Medical History:  Diagnosis Date  . Aortic stenosis    s/p AVR by Dr Laneta Simmers  . COPD (chronic obstructive pulmonary disease) (HCC)   . History of coronary artery disease    status post stenting of the marginal circumflex in 12/2003 and again in 2009  . Hyperlipidemia   . Morbid obesity (HCC)    weight 243 pounds, BMI 31.2kg/m2, BSA 2.36 square meters  . Obstructive sleep apnea    compliant with CPAP  . Persistent atrial fibrillation [facility])     Patient Active Problem List   Diagnosis Date Noted  . Subdural hematoma (HCC) 12/03/2016  . Sick sinus syndrome (HCC) 11/06/2015  . PAF (paroxysmal atrial fibrillation) (HCC) 11/14/2014  . SOB (shortness of breath) 10/31/2014  . Persistent atrial fibrillation (HCC) 10/31/2014  . Aortic stenosis, severe 07/28/2011  . 3-vessel coronary artery disease 07/28/2011  .  Hyperlipidemia, mixed 07/28/2011    Past Surgical History:  Procedure Laterality Date  . AORTIC VALVE REPLACEMENT (AVR)/CORONARY ARTERY BYPASS GRAFTING (CABG)   08/05/2011   LIMA to LAD, sequential saphenous vein graft to third and fourth obtuse marginal branches of the circumflex, aortic valve replacement using a 23 mm Edwards pericardial valve  . APPENDECTOMY    . CARDIOVERSION N/A 11/14/2014   Procedure: CARDIOVERSION;  Surgeon: Donato Schultz, MD;  [facility]: [facility] END[facility]COPY;  Service: Cardiovascular;  Laterality: N/A;  . CARDIOVERSION N/A 11/20/2016   Procedure: CARDIOVERSION;  Surgeon: Pricilla Riffle, MD;  Location: [facility] ENDOSCOPY;  Service: Cardiovascular;  Laterality: N/A;  . CAROTID ENDARTERECTOMY     Dr Lollie Sails  . EP IMPLANTABLE DEVICE N/A 11/06/2015   Procedure: PPM Generator Changeout;for sick sinus syndrome with a MDT Adapta L PPM, chronically elevated RV threshold.  . permanent pacemaker     MDT EnRhythm implanted by Dr Lawanda Cousins in [place] for complete heart block with syncope  . REPLACEMENT TOTAL KNEE BILATERAL     2006  . TEE WITHOUT CARDIOVERSION N/A 11/14/2014   Procedure: TRANSESOPHAGEAL ECHOCARDIOGRAM (TEE);  Surgeon: Donato Schultz, MD;  Location: [facility] ENDOSCOPY;  Service: Cardiovascular;  Laterality: N/A;       Home Medications    Prior to Admission medications   Medication Sig Start Date End Date Taking? Authorizing Provider  amiodarone (PACERONE) 200 MG tablet Take 1 tablet (200 mg total) by mouth 2 (two) times daily. 12/01/16   Hillis Range,  MD  aspirin 81 MG tablet Take 81 mg by mouth daily.    Historical Provider, MD  atorvastatin (LIPITOR) 80 MG tablet TAKE 1 TABLET BY MOUTH DAILY 12/18/15   Hillis RangeJames Allred, MD  B Complex Vitamins (VITAMIN B COMPLEX PO) Take 1 tablet by mouth daily.     Historical Provider, MD  escitalopram (LEXAPRO) 10 MG tablet Take 10 mg by mouth at bedtime.    Historical Provider, MD  furosemide (LASIX) 20 MG tablet Take 1 tablet (20mg ) by mouth  daily. May take extra tablet for weight gain >3lbs 11/26/16   Newman Niponna C Carroll, NP  HYDROcodone-acetaminophen (NORCO/VICODIN) 5-325 MG tablet Take 1 tablet by mouth 2 (two) times daily as needed for moderate pain.  12/03/15   Historical Provider, MD  ibuprofen (ADVIL,MOTRIN) 200 MG tablet Take 400 mg by mouth daily as needed for moderate pain.    Historical Provider, MD  Magnesium 250 MG TABS Take 1 tablet (250 mg total) by mouth daily. 11/04/16   Newman Niponna C Carroll, NP  metoprolol succinate (TOPROL-XL) 25 MG 24 hr tablet Take 12.5 mg by mouth 2 (two) times daily.     Historical Provider, MD  potassium chloride (K-DUR) 10 MEQ tablet Take 1 tablet (10meq) by mouth daily. Take extra 1 tablet if taking extra dose of lasix 11/26/16   Newman Niponna C Carroll, NP  warfarin (COUMADIN) 2.5 MG tablet Take as directed by coumadin clinic 09/25/16   Tonny BollmanMichael Cooper, MD  zolpidem (AMBIEN) 10 MG tablet Take 5-10 mg by mouth at bedtime.     Historical Provider, MD    Family History Family History  Problem Relation Age of Onset  . Alzheimer's disease Mother   . Tuberculosis Father   . Other Father     Spinal Meningitis    Social History Social History  Substance Use Topics  . Smoking status: Former Smoker    Packs/day: 0.50    Years: 30.00    Types: Cigarettes    Start date: 10/27/1973    Quit date: 10/28/2003  . Smokeless tobacco: Never Used  . Alcohol use 0.0 oz/week     Comment: 8 oz of wine per night     Allergies   Patient has no known allergies.   Review of Systems Review of Systems 10/14 systems reviewed and are negative other than those stated in the HPI   Physical Exam Updated Vital Signs BP 154/75   Pulse 68   Temp 97.7 F (36.5 C) (Oral)   Resp 18   Ht 6\' 2"  (1.88 m)   Wt 232 lb (105.2 kg)   SpO2 100%   BMI 29.79 kg/m   Physical Exam Physical Exam  Nursing note and vitals reviewed. Constitutional:  Non-toxic, looks unwell with occasional dry heaving, no acute distress Head:  Normocephalic and atraumatic.  Mouth/Throat: Oropharynx is clear and moist.  Neck: Normal range of motion. Neck supple.  Cardiovascular: Normal rate and regular rhythm.  No edema. Pulmonary/Chest: Effort normal and breath sounds normal.  Abdominal: Soft. There is no tenderness. There is no rebound and no guarding.  Musculoskeletal: Normal range of motion.  Neurological: Alert, no facial droop, fluent speech, moves all extremities symmetrically Skin: Skin is warm and dry.  Psychiatric: Cooperative   ED Treatments / Results  Labs (all labs ordered are listed, but only abnormal results are displayed) Labs Reviewed  CBC WITH DIFFERENTIAL/PLATELET - Abnormal; Notable for the following:       Result Value   RBC 3.66 (*)  HCT 38.1 (*)    MCV 104.1 (*)    MCH 35.8 (*)    Neutro Abs 8.4 (*)    All other components within normal limits  COMPREHENSIVE METABOLIC PANEL - Abnormal; Notable for the following:    Chloride 100 (*)    Glucose, Bld 124 (*)    BUN 30 (*)    All other components within normal limits  PROTIME-INR - Abnormal; Notable for the following:    Prothrombin Time 41.1 (*)    INR 4.14 (*)    All other components within normal limits  BRAIN NATRIURETIC PEPTIDE - Abnormal; Notable for the following:    B Natriuretic Peptide 358.8 (*)    All other components within normal limits  TROPONIN I  LIPASE, BLOOD    EKG  EKG Interpretation  Date/Time:  Wednesday December 03 2016 07:02:06 EST Ventricular Rate:  80 PR Interval:    QRS Duration: 159 QT Interval:  459 QTC Calculation: 530 R Axis:   -37 Text Interpretation:  Afib/flutter and ventricular-paced rhythm No further analysis attempted due to paced rhythm similar to prior EKG  Confirmed by Mazal Ebey MD, Mathews Stuhr [phone]) on 12/03/2016 7:15:29 AM       Radiology Dg Chest 2 View  Result Date: 12/03/2016 CLINICAL DATA:  Shortness of breath with chest pain, nausea and vomiting. EXAM: CHEST  2 VIEW COMPARISON:  12/01/2016  FINDINGS: The heart size and pulmonary vascularity are normal. Slight tortuosity and calcification of the thoracic aorta. Pacemaker in place. Aortic valve prosthesis. Blunting of the costophrenic angles bilaterally is chronic. No acute infiltrates or effusions. Lungs are somewhat hyperinflated with flattening of the diaphragm suggesting emphysema. IMPRESSION: No acute abnormality.  Probable emphysema. Electronically Signed   By: Francene Boyers M.D.   On: 12/03/2016 08:22   Dg Chest 2 View  Result Date: 12/01/2016 CLINICAL DATA:  Increasing exertional shortness of breath. History of COPD, atrial fibrillation, coronary artery disease and aortic stenosis. EXAM: CHEST  2 VIEW COMPARISON:  Report of a chest x-ray dated August 25, 2011 FINDINGS: The lungs are mildly hyperinflated with hemidiaphragm flattening. There is no focal infiltrate. There is a small amount of pleural thickening or pleural fluid at the right lung base. The interstitial markings are not abnormally increased. The heart and pulmonary vascularity are normal. The ICD is in stable position. The sternal wires are intact. The prosthetic aortic valve ring appears to be in appropriate position. There is calcification in the wall of the aortic arch. The mediastinum is normal in width. The bony thorax exhibits no acute abnormality. IMPRESSION: Chronic bronchitic changes, likely stable. Tiny right pleural effusion but this is not new by report. Previous CABG. No acute pneumonia. Thoracic aortic atherosclerosis. Electronically Signed   By: David  [place] M.D.   On: 12/01/2016 10:16   Ct Head Wo Contrast  Result Date: 12/03/2016 CLINICAL DATA:  Multiple falls, most recently yesterday. EXAM: CT HEAD WITHOUT CONTRAST TECHNIQUE: Contiguous axial images were obtained from the base of the skull through the vertex without intravenous contrast. COMPARISON:  05/05/2014 FINDINGS: Brain: Right holo hemispheric acute subdural hematoma with maximal thickness in the  parietal region measuring 15 mm. Mass effect with right-to-left shift of 7.5 mm. No evidence of intraparenchymal bleeding. There may be a very small amount of subarachnoid blood on the right. No subdural on the left. No hydrocephalus. No sign of focal brain infarction. Chronic small-vessel changes of the deep white matter. Vascular: There is atherosclerotic calcification of the major vessels at the  base of the brain. Skull: No skull fracture. Sinuses/Orbits: Clear/normal Other: Left parietal scalp hematoma. IMPRESSION: Left parietal scalp hematoma. Acute right holo hemispheric subdural hematoma with maximal thickness in the right posterior parietal region measuring 15 mm. Right-to-left shift of 7.5 mm. Critical Value/emergent results were called by telephone at the time of interpretation on 12/03/2016 at 8:33 am to Dr. Crista Curb , who verbally acknowledged these results. Electronically Signed   By: Paulina Fusi M.D.   On: 12/03/2016 08:41    Procedures Procedures (including critical care time) CRITICAL CARE Performed by: Lavera Guise   Total critical care time: 40 minutes  Critical care time was exclusive of separately billable procedures and treating other patients.  Critical care was necessary to treat or prevent imminent or life-threatening deterioration.  Critical care was time spent personally by me on the following activities: development of treatment plan with patient and/or surrogate as well as nursing, discussions with consultants, evaluation of patient's response to treatment, examination of patient, obtaining history from patient or surrogate, ordering and performing treatments and interventions, ordering and review of laboratory studies, ordering and review of radiographic studies, pulse oximetry and re-evaluation of patient's condition.  Medications Ordered in ED Medications  prothrombin complex conc human (KCENTRA) IVPB 3,500 Units (not administered)  phytonadione (VITAMIN K) 10 mg in  dextrose 5 % 50 mL IVPB (10 mg Intravenous New Bag/Given 12/03/16 0905)  ondansetron (ZOFRAN) injection 4 mg (not administered)  phytonadione (VITAMIN K) 10 MG/ML SQ injection (  Not Given 12/03/16 0909)  ondansetron (ZOFRAN) injection 4 mg (4 mg Intravenous Given 12/03/16 0740)  aspirin chewable tablet 324 mg (324 mg Oral Given 12/03/16 0742)     Initial Impression / Assessment and Plan / ED Course  I have reviewed the triage vital signs and the nursing notes.  Pertinent labs & imaging results that were available during my care of the patient were reviewed by me and considered in my medical decision making (see chart for details).     Old records are reviewed. Patient's recently seen in the cardiology office on January 31 and February 5th for 2-3 weeks of fatigue and dyspnea on exertion. He has been symptomatic from persistent atrial fibrillation, with recent cardioversion to sinus rhythm for only 24 hours on January 25. Plan had been to switch from sotalol to amiodarone. Currently has not started amiodarone as awaiting sotalol washout.His pacemaker was interrogated in the office on January 31, without any major events. Interrogated here today without any major changes.  CT head is visualized, and he has a large subdural hematoma with 7. millimeter shift. He is mentating normally, with normal neurological exam. INR is supratherapeutic at 4.14. Reversal attempted, and given KCentra and vitamin K. Spoke with Dr. Yetta Barre who will admit to neuro ICU at [facility]. No operative management at this time unless there is deterioration.   Final Clinical Impressions(s) / ED Diagnoses   Final diagnoses:  Traumatic subdural hematoma without loss of consciousness, initial encounter (HCC)  Supratherapeutic INR    New Prescriptions New Prescriptions   No medications on file     Lavera Guise, MD 12/03/16 [phone]

## 2016-12-03 NOTE — ED Notes (Signed)
Pt returned from xray; Reports feels nauseated. EDP Liu at bedside to eval

## 2016-12-03 NOTE — H&P (Signed)
Reason for Consult:SDH Referring Physician: EDP  Jetty DuhamelJohn R Nyquist is an 83 y.o. male.   HPI:  83 year old gentleman who "fell in the yard" 2 days ago. Presented to the emergency department today. CT scan showed an acute right subdural hematoma. He was on Coumadin for atrial fibrillation and his anticoagulation was reversed in the emergency department. He was then transferred for neurosurgical care. He denies headache. He denies numbness tingling or weakness. He began having some left parietal neglect in the ambulance and therefore his head CT was repeated upon arrival.  Past Medical History:  Diagnosis Date  . Aortic stenosis    s/p AVR by Dr Laneta SimmersBartle  . COPD (chronic obstructive pulmonary disease) (HCC)   . History of coronary artery disease    status post stenting of the marginal circumflex in 12/2003 and again in 2009  . Hyperlipidemia   . Morbid obesity (HCC)    weight 243 pounds, BMI 31.2kg/m2, BSA 2.36 square meters  . Obstructive sleep apnea    compliant with CPAP  . Persistent atrial fibrillation [facility](HCC)     Past Surgical History:  Procedure Laterality Date  . AORTIC VALVE REPLACEMENT (AVR)/CORONARY ARTERY BYPASS GRAFTING (CABG)   08/05/2011   LIMA to LAD, sequential saphenous vein graft to third and fourth obtuse marginal branches of the circumflex, aortic valve replacement using a 23 mm Edwards pericardial valve  . APPENDECTOMY    . CARDIOVERSION N/A 11/14/2014   Procedure: CARDIOVERSION;  Surgeon: Donato SchultzMark Skains, MD;  [facility];  Service: Cardiovascular;  Laterality: N/A;  . CARDIOVERSION N/A 11/20/2016   Procedure: CARDIOVERSION;  Surgeon: Pricilla RifflePaula Ross V, MD;  [facility];  Service: Cardiovascular;  Laterality: N/A;  . CAROTID ENDARTERECTOMY     Dr Lollie Sailsale Williams  . EP IMPLANTABLE DEVICE N/A 11/06/2015   Procedure: PPM Generator Changeout;for sick sinus syndrome with a MDT Adapta L PPM, chronically elevated RV threshold.  . permanent pacemaker     MDT EnRhythm  implanted by Dr Lawanda CousinsAl-Kori in [place] for complete heart block with syncope  . REPLACEMENT TOTAL KNEE BILATERAL     2006  . TEE WITHOUT CARDIOVERSION N/A 11/14/2014   Procedure: TRANSESOPHAGEAL ECHOCARDIOGRAM (TEE);  Surgeon: Donato SchultzMark Skains, MD;  [facility];  Service: Cardiovascular;  Laterality: N/A;    No Known Allergies  Social History  Substance Use Topics  . Smoking status: Former Smoker    Packs/day: 0.50    Years: 30.00    Types: Cigarettes    Start date: 10/27/1973    Quit date: 10/28/2003  . Smokeless tobacco: Never Used  . Alcohol use 0.0 oz/week     Comment: 8 oz of wine per night    Family History  Problem Relation Age of Onset  . Alzheimer's disease Mother   . Tuberculosis Father   . Other Father     Spinal Meningitis     Review of Systems  Positive ROS: neg  All other systems have been reviewed and were otherwise negative with the exception of those mentioned in the HPI and as above.  Objective: Vital signs in last 24 hours: Temp:  [97.7 F (36.5 C)] 97.7 F (36.5 C) (02/07 0704) Pulse Rate:  [47-126] 72 (02/07 1519) Resp:  [15-27] 18 (02/07 1519) BP: (127-174)/(59-113) 134/77 (02/07 1519) SpO2:  [92 %-100 %] 100 % (02/07 1519) Weight:  [103.2 kg (227 lb 8.2 oz)-105.2 kg (232 lb)] 103.2 kg (227 lb 8.2 oz) (02/07 1500)  General Appearance: Alert, cooperative, no distress, appears stated  age Head: Normocephalic, without obvious abnormality, atraumatic Eyes: PERRL, conjunctiva/corneas clear, EOM's intact    Neck: Supple Lungs: respirations unlabored Heart: irregular rhythm Abdomen: Soft   NEUROLOGIC:   Mental status: A&O x4, no aphasia, good attention span, Memory and fund of knowledge appear to be appropriate Motor Exam - grossly normal, normal tone and bulk, with no pronator drift. He will show some extinction on the left follow commands on the left Sensory Exam - grossly normal Reflexes: symmetric, no pathologic reflexes, No Hoffman's, No  clonus Coordination - grossly normal Gait - not tested Balance - not tested Cranial Nerves: I: smell Not tested  II: visual acuity  OS: na    OD: na  II: visual fields Full to confrontation  II: pupils Equal, round, reactive to light  III,VII: ptosis None  III,IV,VI: extraocular muscles  Full ROM  V: mastication Normal  V: facial light touch sensation  Normal  V,VII: corneal reflex  Present  VII: facial muscle function - upper  Normal  VII: facial muscle function - lower Normal  VIII: hearing Not tested  IX: soft palate elevation  Normal  IX,X: gag reflex Present  XI: trapezius strength  5/5  XI: sternocleidomastoid strength 5/5  XI: neck flexion strength  5/5  XII: tongue strength  Normal    Data Review Lab Results  Component Value Date   WBC 10.3 12/03/2016   HGB 13.1 12/03/2016   HCT 38.1 (L) 12/03/2016   MCV 104.1 (H) 12/03/2016   PLT 152 12/03/2016   Lab Results  Component Value Date   NA 139 12/03/2016   K 3.8 12/03/2016   CL 100 (L) 12/03/2016   CO2 29 12/03/2016   BUN 30 (H) 12/03/2016   CREATININE 0.93 12/03/2016   GLUCOSE 124 (H) 12/03/2016   Lab Results  Component Value Date   INR 1.23 12/03/2016    Radiology: Dg Chest 2 View  Result Date: 12/03/2016 CLINICAL DATA:  Shortness of breath with chest pain, nausea and vomiting. EXAM: CHEST  2 VIEW COMPARISON:  12/01/2016 FINDINGS: The heart size and pulmonary vascularity are normal. Slight tortuosity and calcification of the thoracic aorta. Pacemaker in place. Aortic valve prosthesis. Blunting of the costophrenic angles bilaterally is chronic. No acute infiltrates or effusions. Lungs are somewhat hyperinflated with flattening of the diaphragm suggesting emphysema. IMPRESSION: No acute abnormality.  Probable emphysema. Electronically Signed   By: Francene Boyers M.D.   On: 12/03/2016 08:22   Ct Head Wo Contrast  Result Date: 12/03/2016 CLINICAL DATA:  Multiple falls, most recently yesterday. EXAM: CT HEAD  WITHOUT CONTRAST TECHNIQUE: Contiguous axial images were obtained from the base of the skull through the vertex without intravenous contrast. COMPARISON:  05/05/2014 FINDINGS: Brain: Right holo hemispheric acute subdural hematoma with maximal thickness in the parietal region measuring 15 mm. Mass effect with right-to-left shift of 7.5 mm. No evidence of intraparenchymal bleeding. There may be a very small amount of subarachnoid blood on the right. No subdural on the left. No hydrocephalus. No sign of focal brain infarction. Chronic small-vessel changes of the deep white matter. Vascular: There is atherosclerotic calcification of the major vessels at the base of the brain. Skull: No skull fracture. Sinuses/Orbits: Clear/normal Other: Left parietal scalp hematoma. IMPRESSION: Left parietal scalp hematoma. Acute right holo hemispheric subdural hematoma with maximal thickness in the right posterior parietal region measuring 15 mm. Right-to-left shift of 7.5 mm. Critical Value/emergent results were called by telephone at the time of interpretation on 12/03/2016 at 8:33 am  to Dr. Crista Curb , who verbally acknowledged these results. Electronically Signed   By: Paulina Fusi M.D.   On: 12/03/2016 08:41     Assessment/Plan: 83 year old gentleman admitted with a right subdural hematoma suffered in a fall 2 days ago.Anticoagulants have been reversed by the ER. ER physician tells me the latest INR is normal.neurologically he looks good other than some mild parietal neglect on the left and repeat CT scan looks stable. I am not recommending craniotomy at the present time. He could potentially develope a chronic subdural hematoma or could acutely decompensate from the current acute hematoma requiring surgical intervention. He will be managed in the neurosurgical ICU.I will start an Anticonvulsant.    Shaaron Golliday S 12/03/2016 3:23 PM

## 2016-12-03 NOTE — ED Triage Notes (Signed)
C/o cp w n/v/sob onset last pm 2200

## 2016-12-03 NOTE — ED Notes (Signed)
medtronic pacemaker interrogated per vorb from Dr. Verdie MosherLiu

## 2016-12-03 NOTE — ED Notes (Signed)
ED Provider at bedside. 

## 2016-12-03 NOTE — ED Notes (Signed)
Dr. Verdie MosherLiu and receiving RN Marchelle FolksAmanda updated of pt's neuro statis; Pt slower to follow commands on left side and shows neglect with left visual field

## 2016-12-03 NOTE — ED Notes (Signed)
Pt vomiting, states headache continues. Post Kcentra pt/inr drawn and sent to lab

## 2016-12-04 ENCOUNTER — Inpatient Hospital Stay (HOSPITAL_COMMUNITY): Payer: Medicare Other

## 2016-12-04 LAB — TROPONIN I: Troponin I: 0.03 ng/mL (ref <0.03)

## 2016-12-04 LAB — PROTIME-INR
INR: 1.32
PROTHROMBIN TIME: 16.5 s — AB (ref 11.4–15.2)

## 2016-12-04 MED ORDER — LORAZEPAM 2 MG/ML IJ SOLN
1.0000 mg | Freq: Once | INTRAMUSCULAR | Status: AC
  Administered 2016-12-04: 1 mg via INTRAVENOUS

## 2016-12-04 MED ORDER — ORAL CARE MOUTH RINSE
15.0000 mL | Freq: Two times a day (BID) | OROMUCOSAL | Status: DC
  Administered 2016-12-04 – 2016-12-10 (×13): 15 mL via OROMUCOSAL

## 2016-12-04 MED ORDER — CHLORHEXIDINE GLUCONATE 0.12 % MT SOLN
15.0000 mL | Freq: Two times a day (BID) | OROMUCOSAL | Status: DC
  Administered 2016-12-04 – 2016-12-11 (×15): 15 mL via OROMUCOSAL
  Filled 2016-12-04 (×8): qty 15

## 2016-12-04 MED ORDER — DEXMEDETOMIDINE HCL IN NACL 200 MCG/50ML IV SOLN
0.2000 ug/kg/h | INTRAVENOUS | Status: AC
  Administered 2016-12-04 (×2): 0.2 ug/kg/h via INTRAVENOUS
  Administered 2016-12-04: 0.3 ug/kg/h via INTRAVENOUS
  Administered 2016-12-05: 0.4 ug/kg/h via INTRAVENOUS
  Filled 2016-12-04 (×4): qty 50

## 2016-12-04 MED ORDER — LORAZEPAM 2 MG/ML IJ SOLN
INTRAMUSCULAR | Status: AC
  Administered 2016-12-04: 1 mg via INTRAVENOUS
  Filled 2016-12-04: qty 1

## 2016-12-04 NOTE — Care Management Note (Addendum)
Case Management Note  Patient Details  Name: PAULINO CORK MRN: [id number] Date of Birth: December 02, 1930  Subjective/Objective:   Pt admitted on 12/03/16 s/p fall with acute Rt subdural hematoma.  PTA, pt independent, lives with spouse.               Action/Plan: Met with pt's wife, at bedside.  Pt currently sedated on Precedex drip due to agitation.  Wife states pt very independent PTA; she endorses that she will be able to provide 24h care at discharge.  Recommend PT/OT consults when able to tolerate therapies.   Will follow for discharge planning as pt progresses.    Expected Discharge Date:                  Expected Discharge Plan:  [place]  In-House Referral:     Discharge planning Services  CM Consult  Post Acute Care Choice:    Choice offered to:     DME Arranged:    DME Agency:     HH Arranged:    [place] Agency:     Status of Service:  In process, will continue to follow  If discussed at Long Length of Stay Meetings, dates discussed:    Additional Comments:  Reinaldo Raddle, RN, BSN  Trauma/Neuro ICU Case Manager [phone]

## 2016-12-04 NOTE — Progress Notes (Signed)
PT Cancellation Note  Patient Details Name: Alex DuhamelJohn R Rhome MRN: [id number] DOB: 06-Jul-1931   Cancelled Treatment:    Reason Eval/Treat Not Completed: Patient not medically ready pt on bedrest. Will await increase in activity orders prior to PT evaluation.   Blake DivineShauna A Tenisha Fleece 12/04/2016, 7:14 AM Mylo RedShauna Princella Jaskiewicz, PT, DPT [phone]

## 2016-12-04 NOTE — Progress Notes (Signed)
Patient ID: Alex [name], male   DOB: [birth year], 83 y.o.   MRN: [id number] Subjective: Patient remains fairly agitated this morning. He is moving all extremities.  Objective: Vital signs in last 24 hours: Temp:  [97.6 F (36.4 C)-98.5 F (36.9 C)] 98.1 F (36.7 C) (02/08 0800) Pulse Rate:  [36-134] 55 (02/08 0853) Resp:  [15-29] 23 (02/08 0853) BP: (92-174)/(59-113) 140/91 (02/08 0845) SpO2:  [92 %-100 %] 99 % (02/08 0853) Weight:  [103.2 kg (227 lb 8.2 oz)] 103.2 kg (227 lb 8.2 oz) (02/07 1500)  Intake/Output from previous day: 02/07 0701 - 02/08 0700 In: 2227.5 [I.V.:1142.5; IV Piggyback:260] Out: 435 [Urine:435] Intake/Output this shift: Total I/O In: 225 [I.V.:225] Out: -   Awake but agitated and moving all extremities. Pupils equal and reactive.  Lab Results: Lab Results  Component Value Date   WBC 10.3 12/03/2016   HGB 13.1 12/03/2016   HCT 38.1 (L) 12/03/2016   MCV 104.1 (H) 12/03/2016   PLT 152 12/03/2016   Lab Results  Component Value Date   INR 1.32 12/04/2016   BMET Lab Results  Component Value Date   NA 139 12/03/2016   K 3.8 12/03/2016   CL 100 (L) 12/03/2016   CO2 29 12/03/2016   GLUCOSE 124 (H) 12/03/2016   BUN 30 (H) 12/03/2016   CREATININE 0.93 12/03/2016   CALCIUM 9.1 12/03/2016    Studies/Results: Dg Chest 2 View  Result Date: 12/03/2016 CLINICAL DATA:  Shortness of breath with chest pain, nausea and vomiting. EXAM: CHEST  2 VIEW COMPARISON:  12/01/2016 FINDINGS: The heart size and pulmonary vascularity are normal. Slight tortuosity and calcification of the thoracic aorta. Pacemaker in place. Aortic valve prosthesis. Blunting of the costophrenic angles bilaterally is chronic. No acute infiltrates or effusions. Lungs are somewhat hyperinflated with flattening of the diaphragm suggesting emphysema. IMPRESSION: No acute abnormality.  Probable emphysema. Electronically Signed   By: Francene Boyers M.D.   On: 12/03/2016 08:22   Ct Head Wo  Contrast  Result Date: 12/04/2016 CLINICAL DATA:  Subdural hematoma. EXAM: CT HEAD WITHOUT CONTRAST TECHNIQUE: Contiguous axial images were obtained from the base of the skull through the vertex without intravenous contrast. COMPARISON:  Yesterday FINDINGS: Brain: High-density subdural hematoma around the right cerebral convexity shows no interval increase compared to prior. Maximal thickness is in the right parietal region at up to 12 mm. Midline shift is stable at up to 6 mm at the septum pellucidum. Small subarachnoid hemorrhage seen in the right parietal region and likely in the inferior right frontal sulci. No visible infarct. No hydrocephalus. Diffuse chronic microvascular ischemic change. Vascular: Atherosclerotic calcification Skull: Left parietal scalp contusion without fracture. Sinuses/Orbits: Curvilinear density overlapping the posterior left globe is likely from motion artifact, and was not seen yesterday. Bilateral cataract resection. Other: Extensive motion artifact which could obscure pathology. IMPRESSION: 1. Stable motion degraded head CT compared yesterday. 2. Acute subdural hematoma around the right cerebral hemisphere with up to 12 mm thickness in the parietal region. Small volume subarachnoid hemorrhage in right cerebral sulci. Midline shift measures up to 6 mm. Electronically Signed   By: Marnee Spring M.D.   On: 12/04/2016 08:39   Ct Head Wo Contrast  Result Date: 12/03/2016 CLINICAL DATA:  Frontal headache, LEFT drift for 1 hour. Follow-up subdural hematoma. EXAM: CT HEAD WITHOUT CONTRAST TECHNIQUE: Contiguous axial images were obtained from the base of the skull through the vertex without intravenous contrast. COMPARISON:  CT HEAD December 03, 2016 FINDINGS: BRAIN: Similar  11 mm dense RIGHT holo hemispheric subdural hematoma. 2 mm RIGHT falcotentorial subdural hematoma, trace LEFT cerebellar tentorium subdural hematoma. Small amount of RIGHT parietal subarachnoid hemorrhage. Focal  subarachnoid hemorrhage versus subdural hematoma anterior falx/ anterior cranial fossa. 6 mm RIGHT to LEFT midline shift. Partially effaced RIGHT lateral ventricle without hydrocephalus or entrapment. Patchy supratentorial white matter hypodensities compatible with mild chronic small vessel ischemic disease. No acute large vascular territory infarct. Basal cisterns remain patent. VASCULAR: Moderate calcific atherosclerosis of the carotid siphons. SKULL: No skull fracture. Large LEFT parietal scalp hematoma without subcutaneous gas or radiopaque foreign bodies. SINUSES/ORBITS: The mastoid air-cells and included paranasal sinuses are well-aerated. Status post bilateral ocular lens implants. The included ocular globes and orbital contents are non-suspicious. OTHER: None. IMPRESSION: Stable examination: 11 mm RIGHT holo hemispheric acute subdural hematoma results in 6 mm RIGHT to LEFT midline shift. No ventricular entrapment. 2 mm RIGHT falcotentorial and trace LEFT cerebellar tentorium subdural hematomas. Small amount of RIGHT parietal subarachnoid hemorrhage. Subarachnoid hemorrhage versus small subdural hematoma falx/ anterior cranial fossa. Electronically Signed   By: Awilda Metroourtnay  Bloomer M.D.   On: 12/03/2016 16:01   Ct Head Wo Contrast  Result Date: 12/03/2016 CLINICAL DATA:  Multiple falls, most recently yesterday. EXAM: CT HEAD WITHOUT CONTRAST TECHNIQUE: Contiguous axial images were obtained from the base of the skull through the vertex without intravenous contrast. COMPARISON:  05/05/2014 FINDINGS: Brain: Right holo hemispheric acute subdural hematoma with maximal thickness in the parietal region measuring 15 mm. Mass effect with right-to-left shift of 7.5 mm. No evidence of intraparenchymal bleeding. There may be a very small amount of subarachnoid blood on the right. No subdural on the left. No hydrocephalus. No sign of focal brain infarction. Chronic small-vessel changes of the deep white matter. Vascular:  There is atherosclerotic calcification of the major vessels at the base of the brain. Skull: No skull fracture. Sinuses/Orbits: Clear/normal Other: Left parietal scalp hematoma. IMPRESSION: Left parietal scalp hematoma. Acute right holo hemispheric subdural hematoma with maximal thickness in the right posterior parietal region measuring 15 mm. Right-to-left shift of 7.5 mm. Critical Value/emergent results were called by telephone at the time of interpretation on 12/03/2016 at 8:33 am to Dr. Crista CurbANA LIU , who verbally acknowledged these results. Electronically Signed   By: Paulina FusiMark  Shogry M.D.   On: 12/03/2016 08:41    Assessment/Plan: I think his scan is stable. I do not believe that evacuation of the subdural hematoma will change his current neurologic status. He could have alcohol withdrawal as he is a heavy drinker and we will start Precedex. This could also be an element of sundowning syndrome. I have talked to his wife.   LOS: 1 day    Levester Waldridge S 12/04/2016, 9:40 AM

## 2016-12-04 NOTE — Progress Notes (Signed)
SLP Cancellation Note  Patient Details Name: Alex DuhamelJohn R [name] MRN: [id number] DOB: May 07, 1931   Cancelled treatment:       Reason Eval/Treat Not Completed: Medical issues which prohibited therapy; pt non-cooperative; given medication prior to SLP attempt; nursing deferred   Torris House,PAT, M.S., CCC-SLP 12/04/2016, 3:37 PM

## 2016-12-04 NOTE — Progress Notes (Signed)
OT Cancellation Note  Patient Details Name: Alex DuhamelJohn R [name] MRN: [id number] DOB: 1931-01-12   Cancelled Treatment:    Reason Eval/Treat Not Completed: Patient not medically ready (active bedrest orders). Will follow up as time allows.  Gaye AlkenBailey A Dartanion Teo M.S., OTR/L Pager: [phone]  12/04/2016, 7:22 AM

## 2016-12-05 ENCOUNTER — Inpatient Hospital Stay (HOSPITAL_COMMUNITY): Payer: Medicare Other

## 2016-12-05 DIAGNOSIS — I6200 Nontraumatic subdural hemorrhage, unspecified: Secondary | ICD-10-CM

## 2016-12-05 DIAGNOSIS — R4182 Altered mental status, unspecified: Secondary | ICD-10-CM

## 2016-12-05 DIAGNOSIS — R4701 Aphasia: Secondary | ICD-10-CM

## 2016-12-05 DIAGNOSIS — R0603 Acute respiratory distress: Secondary | ICD-10-CM

## 2016-12-05 DIAGNOSIS — G9340 Encephalopathy, unspecified: Secondary | ICD-10-CM

## 2016-12-05 LAB — POCT I-STAT 3, ART BLOOD GAS (G3+)
ACID-BASE EXCESS: 1 mmol/L (ref 0.0–2.0)
BICARBONATE: 23.9 mmol/L (ref 20.0–28.0)
O2 SAT: 99 %
PCO2 ART: 32.2 mmHg (ref 32.0–48.0)
PO2 ART: 121 mmHg — AB (ref 83.0–108.0)
Patient temperature: 98.6
TCO2: 25 mmol/L (ref 0–100)
pH, Arterial: 7.478 — ABNORMAL HIGH (ref 7.350–7.450)

## 2016-12-05 LAB — GLUCOSE, CAPILLARY: Glucose-Capillary: 120 mg/dL — ABNORMAL HIGH (ref 65–99)

## 2016-12-05 LAB — PROTIME-INR
INR: 1.44
PROTHROMBIN TIME: 17.6 s — AB (ref 11.4–15.2)

## 2016-12-05 MED ORDER — LORAZEPAM 2 MG/ML IJ SOLN
INTRAMUSCULAR | Status: AC
  Administered 2016-12-05: 1 mg via INTRAVENOUS
  Filled 2016-12-05: qty 1

## 2016-12-05 MED ORDER — LORAZEPAM 2 MG/ML IJ SOLN
1.0000 mg | Freq: Once | INTRAMUSCULAR | Status: AC
  Administered 2016-12-05: 1 mg via INTRAVENOUS

## 2016-12-05 MED ORDER — SODIUM CHLORIDE 0.9 % IV SOLN
500.0000 mg | Freq: Once | INTRAVENOUS | Status: AC
  Administered 2016-12-05: 500 mg via INTRAVENOUS
  Filled 2016-12-05: qty 5

## 2016-12-05 MED ORDER — SODIUM CHLORIDE 0.9 % IV SOLN
750.0000 mg | Freq: Two times a day (BID) | INTRAVENOUS | Status: DC
  Filled 2016-12-05: qty 7.5

## 2016-12-05 MED ORDER — SODIUM CHLORIDE 0.9 % IV SOLN
750.0000 mg | Freq: Two times a day (BID) | INTRAVENOUS | Status: DC
  Administered 2016-12-05 – 2016-12-06 (×2): 750 mg via INTRAVENOUS
  Filled 2016-12-05 (×2): qty 7.5

## 2016-12-05 MED ORDER — LORAZEPAM 2 MG/ML IJ SOLN
1.0000 mg | INTRAMUSCULAR | Status: DC | PRN
  Administered 2016-12-05 – 2016-12-06 (×4): 1 mg via INTRAVENOUS
  Filled 2016-12-05 (×5): qty 1

## 2016-12-05 MED ORDER — THIAMINE HCL 100 MG/ML IJ SOLN
100.0000 mg | Freq: Every day | INTRAMUSCULAR | Status: DC
  Administered 2016-12-05 – 2016-12-08 (×4): 100 mg via INTRAVENOUS
  Filled 2016-12-05 (×5): qty 2

## 2016-12-05 MED ORDER — HEPARIN SODIUM (PORCINE) 1000 UNIT/ML IJ SOLN
INTRAMUSCULAR | Status: AC
  Filled 2016-12-05: qty 1

## 2016-12-05 MED ORDER — FOLIC ACID 5 MG/ML IJ SOLN
1.0000 mg | Freq: Every day | INTRAMUSCULAR | Status: DC
  Administered 2016-12-05 – 2016-12-08 (×4): 1 mg via INTRAVENOUS
  Filled 2016-12-05 (×5): qty 0.2

## 2016-12-05 NOTE — Progress Notes (Signed)
PCCM Interval Note  I spoke with the patient's wife [place] at bedside. Discussed his current condition, agitation, probable EtOH withdrawal. Also discussed his marginal airway protection and BiPAp needs for tachypnea.   I confirmed with her that he would not want MV, ETT, CPR in the event of decline. We will plan to continue BiPAp prn, aggressive medical care, but will change code status to DNR/I.   Levy Pupaobert Jasman Pfeifle, MD, PhD 12/05/2016, 1:00 PM [facility] [phone] or if no answer [phone]

## 2016-12-05 NOTE — Progress Notes (Signed)
PT Cancellation Note  Patient Details Name: Alex DuhamelJohn R [name] MRN: [id number] DOB: [birth year]   Cancelled Treatment:    Reason Eval/Treat Not Completed: Patient not medically ready.  Spoke with RN about new seizure this am.  Will hold PT and mobility at this time.  Will f/u as appropriate.     Alison MurrayMegan F Chinwe Lope, PT [phone] 12/05/2016, 10:03 AM

## 2016-12-05 NOTE — Progress Notes (Signed)
Patient ID: Alex DuhamelJohn R [name], male   DOB: [birth year], 83 y.o.   MRN: [id number] Pt very lethargic after seizure and ativan, off precedex, on bipap, PERRL  CT reviewed  Increase keppra, still feel non-surgical mgmt is best

## 2016-12-05 NOTE — Progress Notes (Signed)
Dr Yetta BarreJones made aware of right side brain irritability per the EEG. Orders received for a one time dose of keppra 500mg  IVP. Orders completed, wife at bedside and updated with plan of care. Will continue to monitor.

## 2016-12-05 NOTE — Progress Notes (Signed)
SLP Cancellation Note  Patient Details Name: Jetty DuhamelJohn R Cullimore MRN: [id number] DOB: 12-31-1930   Cancelled treatment:       Reason Eval/Treat Not Completed: Medical issues which prohibited therapy. Pt currently poorly responsive, requiring BiPAP. Will continue efforts to evaluate cog/com status when pt more appropriate to assess.  Bellina Tokarczyk B. Murvin NatalBueche, [facility] CenterMSP, CCC-SLP [phone]  Leigh AuroraBueche, Kacen Mellinger Brown 12/05/2016, 2:39 PM

## 2016-12-05 NOTE — Procedures (Signed)
ELECTROENCEPHALOGRAM REPORT  Date of Study: 12/05/2016  Patient's Name: Alex DuhamelJohn R [name] MRN: [id number] Date of Birth: 25-Aug-1931  Referring Provider: Dr. Max Fickleouglas McQuaid  Clinical History: This is an 83 year old man with right-sided subdural hematoma with seizures.  Medications: levETIRAcetam (KEPPRA) 750 mg in sodium chloride 0.9 % 100 mL IVPB  LORazepam (ATIVAN) injection 1 mg  acetaminophen (TYLENOL) tablet 650 mg  acetaminophen-codeine (TYLENOL #3) 300-30 MG per tablet 1-2 tablet  amiodarone (PACERONE) tablet 200 mg  chlorhexidine (PERIDEX) 0.12 % solution 15 mL  dexmedetomidine (PRECEDEX) 200 MCG/50ML (4 mcg/mL) infusion  folic acid injection 1 mg  furosemide (LASIX) tablet 20 mg  labetalol (NORMODYNE,TRANDATE) injection 10 mg  metoprolol succinate (TOPROL-XL) 24 hr tablet 12.5 mg  pantoprazole (PROTONIX) injection 40 mg  potassium chloride (K-DUR) CR tablet 10 mEq  senna-docusate (Senokot-S) tablet 1 tablet  thiamine (B-1) injection 100 mg   Technical Summary: A multichannel digital EEG recording measured by the international 10-20 system with electrodes applied with paste and impedances below 5000 ohms performed as portable with EKG monitoring in an unresponsive patient on BiPAP.  Hyperventilation and photic stimulation were not performed.  The digital EEG was referentially recorded, reformatted, and digitally filtered in a variety of bipolar and referential montages for optimal display.   Description: The patient is unresponsive during the recording. There is no clear posterior dominant rhythm. The background consists of a large amount of diffuse 4-5 Hz theta and 2-3 Hz delta slowing with additional focal delta slowing over the right hemisphere. Normal sleep architecture is not seen. Hyperventilation and photic stimulation were not performed. There were occasional low to medium amplitude sharp waves seen over the right posterior temporal region. There were 3  clinicoelectrographic seizures lasting 2-4 minutes arising from the right hemisphere. The first and third seizures are maximal over the right centrotemporal region. The second seizure is maximal over the right frontocentral region.  The patient is noted be staring and unresponsive during the seizures, no motor activity noted.   Technical difficulties with EKG lead.  Impression: This EEG is abnormal due to the presence of: 1. Moderate diffuse slowing of the waking background 2. Additional focal slowing over the right hemisphere 3. Occasional low amplitude epileptiform discharges over the right posterior temporal region 4. Three clinicoelectrographic seizures captured arising from the right hemisphere lasting 2-4 minutes  Clinical Correlation of the above findings indicates diffuse cerebral dysfunction that is non-specific in etiology and can be seen with hypoxic/ischemic injury, toxic/metabolic encephalopathies, or medication effect. Additional focal slowing over the right hemisphere indicates focal cerebral dysfunction in this region suggestive of underlying structural or physiologic abnormality. There were 3 clinicoelectrographic seizures captured lateralizing to the right hemisphere consistent with status epilepticus with patient not returning to baseline in between seizures. Clinical correlation is advised.   Patrcia DollyKaren Kiely Cousar, M.D.

## 2016-12-05 NOTE — Progress Notes (Signed)
OT Cancellation Note  Patient Details Name: Jetty DuhamelJohn R Brabec MRN: [id number] DOB: [birth year]   Cancelled Treatment:    Reason Eval/Treat Not Completed: Patient not medically ready.  Pt with seizure activity this am.  Will reattempt when medically ready.  Arturo Freundlich Mirandaonarpe, OTR/L [phone]   Jeani HawkingConarpe, Karlen Barbar M 12/05/2016, 10:09 AM

## 2016-12-05 NOTE — Consult Note (Signed)
PULMONARY / CRITICAL CARE MEDICINE   Name: Alex [name] MRN: [id number] DOB: 20-Sep-1931    ADMISSION DATE:  12/03/2016 CONSULTATION DATE:  12/05/16  REFERRING MD:  Yetta Barre  CHIEF COMPLAINT:  Resp Distress  HISTORY OF PRESENT ILLNESS:  Pt is encephelopathic; therefore, this HPI is obtained from chart review. Alex [name] is a 83 y.o. male with PMH as outlined below. He was admitted 12/03/16 after he "fell in the yard" 2 days prior to that.  He had CT of the head in ED which showed acute right SDH.  He had been on coumadin for A.fib; this was reversed in ED and pt was then admitted to neuro ICU for further management.  He was started and maintained on precedex for [facility] withdrawal.  He had repeat CT 02/08 which was stable.  He had been doing well up until early AM hours 02/09, when he had an acute grand mal seizure.  He received 1mg  ativan and then later had increased WOB and decreased level of consciousness.  PCCM was subsequently called for further evaluation.  PAST MEDICAL HISTORY :  He  has a past medical history of Aortic stenosis; COPD (chronic obstructive pulmonary disease) (HCC); History of coronary artery disease; Hyperlipidemia; Morbid obesity (HCC); Obstructive sleep apnea; and Persistent atrial fibrillation (HCC).  PAST SURGICAL HISTORY: He  has a past surgical history that includes Appendectomy; Replacement total knee bilateral; Carotid endarterectomy; permanent pacemaker; Aortic valve replacement (avr)/coronary artery bypass grafting (cabg) ( 08/05/2011); TEE without cardioversion (N/A, 11/14/2014); Cardioversion (N/A, 11/14/2014); Cardiac catheterization (N/A, 11/06/2015); and Cardioversion (N/A, 11/20/2016).  No Known Allergies  No current facility-administered medications on file prior to encounter.    Current Outpatient Prescriptions on File Prior to Encounter  Medication Sig  . amiodarone (PACERONE) 200 MG tablet Take 1 tablet (200 mg total) by mouth 2 (two) times daily.  Marland Kitchen  aspirin 81 MG tablet Take 81 mg by mouth daily.  Marland Kitchen atorvastatin (LIPITOR) 80 MG tablet TAKE 1 TABLET BY MOUTH DAILY  . B Complex Vitamins (VITAMIN B COMPLEX PO) Take 1 tablet by mouth daily.   Marland Kitchen escitalopram (LEXAPRO) 10 MG tablet Take 10 mg by mouth at bedtime.  . furosemide (LASIX) 20 MG tablet Take 1 tablet (20mg ) by mouth daily. May take extra tablet for weight gain >3lbs  . HYDROcodone-acetaminophen (NORCO/VICODIN) 5-325 MG tablet Take 1 tablet by mouth 2 (two) times daily as needed for moderate pain.   Marland Kitchen ibuprofen (ADVIL,MOTRIN) 200 MG tablet Take 400 mg by mouth daily as needed for moderate pain.  . Magnesium 250 MG TABS Take 1 tablet (250 mg total) by mouth daily.  . metoprolol succinate (TOPROL-XL) 25 MG 24 hr tablet Take 12.5 mg by mouth 2 (two) times daily.   . potassium chloride (K-DUR) 10 MEQ tablet Take 1 tablet ( ) by mouth daily. Take extra 1 tablet if taking extra dose of lasix  . warfarin (COUMADIN) 2.5 MG tablet Take as directed by coumadin clinic (Patient taking differently: Take 1.25-2.5 mg by mouth See admin instructions. Pt takes 1.25mg  on Thursday, takes 2.5mg  on all other days)  . zolpidem (AMBIEN) 10 MG tablet Take 5-10 mg by mouth at bedtime.     FAMILY HISTORY:  His indicated that his mother is deceased. He indicated that his father is deceased.    SOCIAL HISTORY: He  reports that he quit smoking about 13 years ago. His smoking use included Cigarettes. He started smoking about 43 years ago. He has a 15.00 pack-year smoking  history. He has never used smokeless tobacco. He reports that he drinks alcohol. He reports that he does not use drugs.  REVIEW OF SYSTEMS:   Unable to obtain as pt is encephalopathic.  SUBJECTIVE:  Hypersomnolent.  Opens eyes to sternal rub but quickly falls back asleep.  Has mild increase in WOB.  VITAL SIGNS: BP 137/63   Pulse 69   Temp 99 F (37.2 C) (Axillary)   Resp 16   Ht 6\' 2"  (1.88 m)   Wt 103.2 kg (227 lb 8.2 oz)   SpO2  100%   BMI 29.21 kg/m   HEMODYNAMICS:    VENTILATOR SETTINGS:    INTAKE / OUTPUT: I/O last 3 completed shifts: In: 3701.1 [I.V.:2511.1; Other:825; IV Piggyback:365] Out: 835 [Urine:835]   PHYSICAL EXAMINATION: General: Elderly male, in mild respiratory distress. Neuro: Hypersomnolent.  Opens eyes to sternal rub but then falls asleep again.  Flaccid on left (this has how he has been since admission per RN). HEENT: [place]/AT. PERRL, sclerae anicteric. Cardiovascular: RRR, no M/R/G.  Lungs: Respirations even and mildly labored.  CTA bilaterally, No W/R/R. Mild increase in WOB. Abdomen: BS x 4, soft, NT/ND.  Musculoskeletal: No gross deformities, no edema.  Skin: Intact, warm, no rashes.  LABS:  BMET  Recent Labs Lab 12/03/16 0718  NA 139  K 3.8  CL 100*  CO2 29  BUN 30*  CREATININE 0.93  GLUCOSE 124*    Electrolytes  Recent Labs Lab 12/03/16 0718  CALCIUM 9.1    CBC  Recent Labs Lab 12/03/16 0718  WBC 10.3  HGB 13.1  HCT 38.1*  PLT 152    Coag's  Recent Labs Lab 12/03/16 1810 12/04/16 0355 12/05/16 0256  INR 1.36 1.32 1.44    Sepsis Markers No results for input(s): LATICACIDVEN, PROCALCITON, O2SATVEN in the last 168 hours.  ABG No results for input(s): PHART, PCO2ART, PO2ART in the last 168 hours.  Liver Enzymes  Recent Labs Lab 12/01/16 0930 12/03/16 0718  AST 31 35  ALT 18 19  ALKPHOS 57 56  BILITOT 0.8 1.2  ALBUMIN 3.6 4.0    Cardiac Enzymes  Recent Labs Lab 12/03/16 0718 12/03/16 2256  TROPONINI <0.03 0.03*    Glucose  Recent Labs Lab 12/05/16 0452  GLUCAP 120*    Imaging Ct Head Wo Contrast  Result Date: 12/04/2016 CLINICAL DATA:  Subdural hematoma. EXAM: CT HEAD WITHOUT CONTRAST TECHNIQUE: Contiguous axial images were obtained from the base of the skull through the vertex without intravenous contrast. COMPARISON:  Yesterday FINDINGS: Brain: High-density subdural hematoma around the right cerebral convexity  shows no interval increase compared to prior. Maximal thickness is in the right parietal region at up to 12 mm. Midline shift is stable at up to 6 mm at the septum pellucidum. Small subarachnoid hemorrhage seen in the right parietal region and likely in the inferior right frontal sulci. No visible infarct. No hydrocephalus. Diffuse chronic microvascular ischemic change. Vascular: Atherosclerotic calcification Skull: Left parietal scalp contusion without fracture. Sinuses/Orbits: Curvilinear density overlapping the posterior left globe is likely from motion artifact, and was not seen yesterday. Bilateral cataract resection. Other: Extensive motion artifact which could obscure pathology. IMPRESSION: 1. Stable motion degraded head CT compared yesterday. 2. Acute subdural hematoma around the right cerebral hemisphere with up to 12 mm thickness in the parietal region. Small volume subarachnoid hemorrhage in right cerebral sulci. Midline shift measures up to 6 mm. Electronically Signed   By: Marnee SpringJonathon  Watts M.D.   On: 12/04/2016 08:39  STUDIES:  CT head 02/08 > stable CT compared to prior (stable acute right SDH). CT head 02/09 >  CULTURES: None.  ANTIBIOTICS: None.  SIGNIFICANT EVENTS: 02/07 > admit. 02/09 > seizure > resp distress after 1mg  ativan.  LINES/TUBES: None.  DISCUSSION: 83 y.o. male admitted 02/07 with acute right SDH.  Had grand mal seizure early AM 02/09, received 1mg  ativan then had increased WOB.  PCCM asked to see.  ASSESSMENT / PLAN:  NEUROLOGIC A:   Acute right SDH - being followed by neurosurgery. Acute grand mal seizure early AM 02/0. Acute encephalopathy - ? Ativan related vs post ictal. Hx EtoH abuse - has been on prophylactic precedex since admission. P:   STAT head CT. Neurosurgery following. Hold precedex given hypersomnolence (was actually d/c'd earlier due to intermittent bradycardia). Continue prophylactic AED (keppra). Thiamine / Folate. Hold  preadmission alprazolam, escitalopram, norco, zolpidem.  PULMONARY A: Mild respiratory distress - likely mild hypercarbia after ativan; ABG pending. Hx COPD per report (PFT's from 2014 do not show true obstruction), OSA. P:   STAT ABG. Plan for BiPAP for now unless ABG worse than anticipated.  If so, will intubate. Continue BD's. CXR intermittently.  CARDIOVASCULAR A:  Hx A.fib (on coumadin; currently being held due to SDH), HLD, CAD, AS s/p AVR. P:  Continue to hold coumadin. Monitor hemodynamics. Continue preadmisson amio, toprol-xl. Hold preadmission coumadin.   RENAL A:   No acute issues. P:   Follow BMP.  GASTROINTESTINAL A:   Nutrition. P:   NPO.  HEMATOLOGIC A:   VTE Prophylaxis. P:  SCD's. CBC in AM.  INFECTIOUS A:   No indication of infection. P:   Monitor clinically.  ENDOCRINE A:   No acute issues.   P:   No interventions required.   Family updated: None available.  Interdisciplinary Family Meeting v Palliative Care Meeting:  Due by: 12/12/16.  CC time: 30 minutes.   Rutherford Guys, [place] - [facility] [place] [facility] Pager: [phone]  or [phone] 12/05/2016, 5:33 AM

## 2016-12-05 NOTE — Progress Notes (Signed)
Patient ID: Jetty DuhamelJohn R Mcgovern, male   DOB: 09-Sep-1931, 83 y.o.   MRN: [id number] BP 127/74   Pulse 68   Temp 99 F (37.2 C) (Axillary)   Resp 14   Ht 6\' 2"  (1.88 m)   Wt 103.2 kg (227 lb 8.2 oz)   SpO2 95%   BMI 29.21 kg/m  Called last night after seizure like activity. Was noted to be lethargic, not as sharp as he had been. Head CT ordered, actually improved. Certainly no need for operative decompression of posterior subdural, no significant mass effect.  Mildly improved, calm resting in bed. Respiratory status is good.

## 2016-12-05 NOTE — Progress Notes (Signed)
EEG completed, results pending. 

## 2016-12-05 NOTE — Progress Notes (Signed)
Between [id number], witnessed increased amount of artifact on cardiac monitor for pt. Immediately went to assess pt's status - found pt to be having seizure-like activity (convulsions of entire body) followed by decreased LOC, inability to follow commands and increased work of breathing. Notified for assistance via other RN's on unit - in attempt to assess pt multiple interventions were taken: 1. Called on-call neurosurgeon - received orders for a stat head CT w/o contrast and to give 1mg  of ativan IVP. After giving ativan, if WOB did not decrease - to notify CCM via eLink. 2. CBG was assessed - 120 was the result 3. WOB remained difficult for pt and CCM was notified. eLink MD notified on-site CCM PA/MD to assess the pt face-to-face. Upon that assessment, the pt was put on bi-pap in an attempt to maintain SPO2 and decrease WOB/use of accessory muscles.  4. CCM gave RRT orders to obtain a stat ABG via I-stat. 5. Pt went down to CT on bi-pap w/ transport and RRT to assist RN. Pt maintained adequate SPO2 throughout transport and is currently resting in 3M09.  6. Precedex gtt remains off.  Pt more alert at this time - awakens to voice/touch intermittently. Was able to say "okay" appropriately and nod head.  Will continue to monitor closely.  Francia GreavesSavannah R Chyla Schlender, RN

## 2016-12-06 ENCOUNTER — Inpatient Hospital Stay (HOSPITAL_COMMUNITY): Payer: Medicare Other

## 2016-12-06 DIAGNOSIS — R569 Unspecified convulsions: Secondary | ICD-10-CM

## 2016-12-06 LAB — BASIC METABOLIC PANEL
ANION GAP: 9 (ref 5–15)
BUN: 24 mg/dL — ABNORMAL HIGH (ref 6–20)
CO2: 24 mmol/L (ref 22–32)
CREATININE: 0.69 mg/dL (ref 0.61–1.24)
Calcium: 8.3 mg/dL — ABNORMAL LOW (ref 8.9–10.3)
Chloride: 112 mmol/L — ABNORMAL HIGH (ref 101–111)
GFR calc non Af Amer: >60 mL/min (ref >60)
GLUCOSE: 95 mg/dL (ref 65–99)
Potassium: 3.4 mmol/L — ABNORMAL LOW (ref 3.5–5.1)
Sodium: 145 mmol/L (ref 135–145)

## 2016-12-06 LAB — CBC
HCT: 34.8 % — ABNORMAL LOW (ref 39.0–52.0)
HEMOGLOBIN: 11 g/dL — AB (ref 13.0–17.0)
MCH: 33.3 pg (ref 26.0–34.0)
MCHC: 31.6 g/dL (ref 30.0–36.0)
MCV: 105.5 fL — ABNORMAL HIGH (ref 78.0–100.0)
PLATELETS: 123 10*3/uL — AB (ref 150–400)
RBC: 3.3 MIL/uL — ABNORMAL LOW (ref 4.22–5.81)
RDW: 13.7 % (ref 11.5–15.5)
WBC: 12.6 10*3/uL — ABNORMAL HIGH (ref 4.0–10.5)

## 2016-12-06 LAB — GLUCOSE, CAPILLARY
GLUCOSE-CAPILLARY: 104 mg/dL — AB (ref 65–99)
Glucose-Capillary: 116 mg/dL — ABNORMAL HIGH (ref 65–99)
Glucose-Capillary: 128 mg/dL — ABNORMAL HIGH (ref 65–99)

## 2016-12-06 LAB — PROTIME-INR
INR: 1.47
Prothrombin Time: 18 seconds — ABNORMAL HIGH (ref 11.4–15.2)

## 2016-12-06 MED ORDER — SODIUM CHLORIDE 0.9 % IV SOLN
1000.0000 mg | Freq: Two times a day (BID) | INTRAVENOUS | Status: DC
  Administered 2016-12-06 – 2016-12-11 (×11): 1000 mg via INTRAVENOUS
  Filled 2016-12-06 (×12): qty 10

## 2016-12-06 MED ORDER — VITAL AF 1.2 CAL PO LIQD
1000.0000 mL | ORAL | Status: DC
  Administered 2016-12-06 – 2016-12-08 (×4): 1000 mL

## 2016-12-06 MED ORDER — LORAZEPAM 2 MG/ML IJ SOLN
1.0000 mg | INTRAMUSCULAR | Status: DC | PRN
  Administered 2016-12-06 – 2016-12-10 (×7): 1 mg via INTRAVENOUS
  Filled 2016-12-06 (×7): qty 1

## 2016-12-06 MED ORDER — VITAL HIGH PROTEIN PO LIQD: 1000.0000 mL | ORAL | Status: DC

## 2016-12-06 MED ORDER — METOPROLOL TARTRATE 25 MG/10 ML ORAL SUSPENSION
12.5000 mg | Freq: Two times a day (BID) | ORAL | Status: DC
  Administered 2016-12-06 – 2016-12-11 (×10): 12.5 mg
  Filled 2016-12-06 (×3): qty 5
  Filled 2016-12-06 (×10): qty 10

## 2016-12-06 NOTE — Progress Notes (Signed)
PULMONARY / CRITICAL CARE MEDICINE   Name: Alex [name] MRN: [id number] DOB: 09/20/1931    ADMISSION DATE:  12/03/2016 CONSULTATION DATE:  12/05/16  REFERRING MD:  Yetta Barre  CHIEF COMPLAINT:  Resp Distress  HISTORY OF PRESENT ILLNESS:  Pt is encephelopathic; therefore, this HPI is obtained from chart review. Alex [name] is a 83 y.o. male with PMH as outlined below. He was admitted 12/03/16 after he "fell in the yard" 2 days prior to that.  He had CT of the head in ED which showed acute right SDH.  He had been on coumadin for A.fib; this was reversed in ED and pt was then admitted to neuro ICU for further management.  He was started and maintained on precedex for [facility] withdrawal.  He had repeat CT 02/08 which was stable.  He had been doing well up until early AM hours 02/09, when he had an acute grand mal seizure.  He received 1mg  ativan and then later had increased WOB and decreased level of consciousness.  PCCM was subsequently called for further evaluation.   SUBJECTIVE:   Off bipap, lethargic, lying sideways in bed afebrile  VITAL SIGNS: BP (!) 126/96   Pulse (!) 115   Temp 98 F (36.7 C) (Axillary)   Resp (!) 21   Ht 6\' 2"  (1.88 m)   Wt 227 lb 8.2 oz (103.2 kg)   SpO2 100%   BMI 29.21 kg/m   HEMODYNAMICS:    VENTILATOR SETTINGS: Vent Mode: BIPAP FiO2 (%):  [40 %] 40 % PEEP:  [5 cmH20] 5 cmH20 Pressure Support:  [5 cmH20] 5 cmH20  INTAKE / OUTPUT: I/O last 3 completed shifts: In: 3028.4 [I.V.:2603.4; IV Piggyback:425] Out: 1400 [Urine:1400]   PHYSICAL EXAMINATION: General: Elderly male, in mild respiratory distress. Neuro: lethargic, follows commands on right Flaccid on left  HEENT: [place]/AT. PERRL, sclerae anicteric. Cardiovascular: RRR, no M/R/G.  Lungs: Respirations even and mildly labored.  CTA bilaterally, No W/R/R. Mild increase in WOB. Abdomen: BS x 4, soft, NT/ND.  Musculoskeletal: No gross deformities, no edema.  Skin: Intact, warm, no  rashes.  LABS:  BMET  Recent Labs Lab 12/03/16 0718  NA 139  K 3.8  CL 100*  CO2 29  BUN 30*  CREATININE 0.93  GLUCOSE 124*    Electrolytes  Recent Labs Lab 12/03/16 0718  CALCIUM 9.1    CBC  Recent Labs Lab 12/03/16 0718  WBC 10.3  HGB 13.1  HCT 38.1*  PLT 152    Coag's  Recent Labs Lab 12/04/16 0355 12/05/16 0256 12/06/16 0436  INR 1.32 1.44 1.47    Sepsis Markers No results for input(s): LATICACIDVEN, PROCALCITON, O2SATVEN in the last 168 hours.  ABG  Recent Labs Lab 12/05/16 0543  PHART 7.478*  PCO2ART 32.2  PO2ART 121.0*    Liver Enzymes  Recent Labs Lab 12/01/16 0930 12/03/16 0718  AST 31 35  ALT 18 19  ALKPHOS 57 56  BILITOT 0.8 1.2  ALBUMIN 3.6 4.0    Cardiac Enzymes  Recent Labs Lab 12/03/16 0718 12/03/16 2256  TROPONINI <0.03 0.03*    Glucose  Recent Labs Lab 12/05/16 0452  GLUCAP 120*    Imaging No results found.   STUDIES:  CT head 02/08 > stable CT compared to prior (stable acute right SDH). CT head 02/09 >Right convexity subdural hematoma is stable to mildly decreased in size   EEG 2/9 >> rt seizure focus, Occasional low amplitude epileptiform discharges over the right posterior temporal region Three  clinicoelectrographic seizures captured arising from the right hemisphere lasting 2-4 minutes  CULTURES: None.  ANTIBIOTICS: None.  SIGNIFICANT EVENTS: 02/07 > admit. 02/09 > seizure > resp distress after 1mg  ativan.  LINES/TUBES: None.  DISCUSSION: 83 y.o. male admitted 02/07 with acute right SDH.  Had grand mal seizure early AM 02/09, received 1mg  ativan then had increased WOB.    ASSESSMENT / PLAN:  NEUROLOGIC A:   Acute right SDH - being followed by neurosurgery. Acute grand mal seizure  Acute encephalopathy - ? Ativan related vs post ictal. Hx EtoH abuse - has been on prophylactic precedex since admission. P:   Neurosurgery following. Hold precedex given hypersomnolence &  bradycardia - use ativan prn Continue prophylactic AED (keppra). Thiamine / Folate. Hold preadmission alprazolam, escitalopram, norco, zolpidem.  PULMONARY A: Acute resp failure - DNR/I Hx COPD per report (PFT's from 2014 do not show true obstruction), OSA. P:   dc BiPAP Continue BD's. CXR intermittently.  CARDIOVASCULAR A:  Hx A.fib (on coumadin; currently being held due to SDH), HLD, CAD, AS s/p AVR. P:  Continue to hold coumadin. Continue preadmisson amio, toprol-xl.   RENAL A:   No acute issues. P:   Follow BMP.  GASTROINTESTINAL A:   Nutrition. P:   NPO. Insert NGT  HEMATOLOGIC A:   VTE Prophylaxis. P:  SCD's. CBC in AM.  INFECTIOUS A:   No indication of infection. P:   Monitor clinically.  ENDOCRINE A:   No acute issues.   P:   No interventions required.    d/w wife 2/9 -DNR/I issued  Interdisciplinary Family Meeting v Palliative Care Meeting: done 2/9  CC time: 30 minutes.  Cyril Mourningakesh Marybelle Giraldo MD. Tonny BollmanFCCP. [place] [facility] Pager [phone] If no response call [phone]    12/06/2016, 8:03 AM

## 2016-12-06 NOTE — Evaluation (Signed)
Physical Therapy Evaluation Patient Details Name: Alex [name] MRN: [id number] DOB: May 22, 1931 Today's Date: 12/06/2016   History of Present Illness  83 year old man with right-sided subdural hematoma with seizures  Clinical Impression  Patient demonstrates deficits in functional mobility as indicated below. Will need continued skilled PT to address deficits and maximize function. Will see as indicated and progress as tolerated. Prior to admission, wife states patient was independent with mobility and getting along fine. Given prior level of function and significant deficits noted, feel patient will need comprehensive therapies upon acute discharge pending patients ability to progress and engage with treatment team. Will follow acutely.    Follow Up Recommendations CIR    Equipment Recommendations   (TBD)    Recommendations for Other Services Rehab consult     Precautions / Restrictions Precautions Precautions: Fall (aeizure)      Mobility  Bed Mobility Overal bed mobility: Needs Assistance Bed Mobility: Rolling;Supine to Sit;Sit to Supine Rolling: Total assist;+2 for physical assistance   Supine to sit: Total assist;+2 for physical assistance Sit to supine: Total assist;+2 for physical assistance   General bed mobility comments: Heliopter technique with chuck pad to bring to EOB. patient with noted extensor response on right side, question volitional vs tone response.   Increased physical assist to position and scoot to EOB, no overtly notable initiation from patient at this time. Assisted back to bed with reverse technique then repositioned in bed with HOB elevated.  Transfers                 General transfer comment: no safe to attempt at this time  Ambulation/Gait                Stairs            Wheelchair Mobility    Modified Rankin (Stroke Patients Only) Modified Rankin (Stroke Patients Only) Pre-Morbid Rankin Score: No symptoms Modified  Rankin: Severe disability     Balance Overall balance assessment: Needs assistance Sitting-balance support: Feet supported;Bilateral upper extremity supported Sitting balance-[name]: Zero Sitting balance - Comments: 2 person max assist, RUE pushing (question tone vs volitional) left lateral lean.  Postural control: Left lateral lean                                   Pertinent Vitals/Pain Pain Assessment: Faces Faces Pain Scale: Hurts a little bit Pain Location: generalized discomfort    Home Living Family/patient expects to be discharged to:: Private residence Living Arrangements: Spouse/significant other Available Help at Discharge: Family Type of Home: House Home Access: Stairs to enter Entrance Stairs-Rails: Right Entrance Stairs-Number of Steps: 3 Home Layout: One level        Prior Function Level of Independence: Independent               Hand Dominance   Dominant Hand: Right    Extremity/Trunk Assessment   Upper Extremity Assessment Upper Extremity Assessment: Defer to OT evaluation    Lower Extremity Assessment Lower Extremity Assessment: Generalized weakness;Difficult to assess due to impaired cognition RLE Deficits / Details: increased tone noted when attempting to move RLE toward EOB, question tone vs patient extending against therapist activity LLE Deficits / Details: unable to assess       Communication      Cognition Arousal/Alertness: Lethargic Behavior During Therapy: Flat affect Overall Cognitive Status: Difficult to assess  General Comments      Exercises     Assessment/Plan    PT Assessment Patient needs continued PT services  PT Problem List Decreased strength;Decreased activity tolerance;Decreased balance;Decreased mobility;Decreased coordination;Decreased cognition;Decreased safety awareness;Pain          PT Treatment Interventions DME instruction;Gait training;Stair  training;Functional mobility training;Therapeutic activities;Therapeutic exercise;Balance training;Neuromuscular re-education;Cognitive remediation;Patient/family education    PT Goals (Current goals can be found in the Care Plan section)  Acute Rehab PT Goals Patient Stated Goal: to get better PT Goal Formulation: With family Time For Goal Achievement: 12/13/16 Potential to Achieve Goals: Fair    Frequency Min 3X/week   Barriers to discharge        Co-evaluation               End of Session Equipment Utilized During Treatment: Oxygen Activity Tolerance: Patient limited by fatigue;Patient limited by lethargy Patient left: in bed;with call bell/phone within reach;with bed alarm set;with family/visitor present;with restraints reapplied Nurse Communication: Mobility status         Time: 1356-1420 PT Time Calculation (min) (ACUTE ONLY): 24 min   Charges:   PT Evaluation $PT Eval Moderate Complexity: 1 Procedure PT Treatments $Therapeutic Activity: 8-22 mins   PT G Codes:        Fabio AsaDevon J Azoria Abbett 12/06/2016, 3:47 PM Charlotte Crumbevon Farah Lepak, PT DPT  [phone]

## 2016-12-06 NOTE — Progress Notes (Signed)
Rehab Admissions Coordinator Note:  Patient was screened by Clois DupesBoyette, Zienna Ahlin Godwin for appropriateness for an Inpatient Acute Rehab Consult per PT recommendation.  At this time, we are recommending Inpatient Rehab consult.  Please place order for consult as appropriate.  Clois DupesBoyette, Naphtali Zywicki Godwin 12/06/2016, 4:29 PM  I can be reached at [phone].

## 2016-12-06 NOTE — Progress Notes (Signed)
Pt seen and examined. Had a seizure early this morning  EXAM: Temp:  [97.4 F (36.3 C)-101.2 F (38.4 C)] 98.5 F (36.9 C) (02/10 0800) Pulse Rate:  [32-117] 115 (02/10 0700) Resp:  [13-21] 21 (02/10 0700) BP: (108-164)/(64-98) 126/96 (02/10 0700) SpO2:  [91 %-100 %] 100 % (02/10 0700) FiO2 (%):  [40 %] 40 % (02/09 1400) Weight:  [104.8 kg (231 lb 0.7 oz)] 104.8 kg (231 lb 0.7 oz) (02/10 0800) Intake/Output      02/09 0701 - 02/10 0700 02/10 0701 - 02/11 0700   I.V. (mL/kg) 1791.3 (17.4)    IV Piggyback 320    Total Intake(mL/kg) 2111.3 (20.5)    Urine (mL/kg/hr) 900 (0.4)    Total Output 900     Net +1211.3          Urine Occurrence 6 x     Arousable Not talking Moving all extremities spontaneously  Seizures likely related to both SDH and ETOH withdrawal, probably mostly ETOH withdrawal Will consult neurology for further advice for seizures SDH fairly small, likely minimal benefit to evacuation

## 2016-12-06 NOTE — Progress Notes (Signed)
Initial Nutrition Assessment  DOCUMENTATION CODES:   Not applicable  INTERVENTION:    Initiate Vital AF 1.2 at goal rate of 75 ml/h (1800 ml per day) to provide 2160 kcals, 135 gm protein, 1459 ml free water daily  NUTRITION DIAGNOSIS:   Inadequate oral intake related to inability to eat as evidenced by NPO status  GOAL:   Patient will meet greater than or equal to 90% of their needs  MONITOR:   Vent status, TF tolerance, Labs, Weight trends, I & O's  REASON FOR ASSESSMENT:   Consult Enteral/tube feeding initiation and management  ASSESSMENT:   83 y.o. Male with PMH as outlined below. He was admitted 12/03/16 after he "fell in the yard" 2 days prior to that.  He had CT of the head in ED which showed acute right SDH.  He had been on coumadin for A.fib; this was reversed in ED and pt was then admitted to neuro ICU for further management.  Patient is currently intubated on ventilator support MV: 11.9 L/min Temp (24hrs), Avg:99.1 F (37.3 C), Min:97.4 F (36.3 C), Max:101.2 F (38.4 C)  RD spoke with pt's wife at bedside. Reports pt was eating well PTA; no recent weight loss. CORTRAK small bore feeding tube placed this AM; tip in duodenum. Labs and medications reviewed. CBG 120.  Nutrition focused physical exam completed.  No muscle or subcutaneous fat depletion noticed.  Diet Order:  Diet NPO time specified  Skin:  Reviewed, no issues  Last BM:  PTA  Height:   Ht Readings from Last 1 Encounters:  12/06/16 6\' 2"  (1.88 m)    Weight:   Wt Readings from Last 1 Encounters:  12/06/16 231 lb 0.7 oz (104.8 kg)    Ideal Body Weight:  86.3 kg  BMI:  Body mass index is 29.66 kg/m.  Estimated Nutritional Needs:   Kcal:  2149  Protein:  130-140 gm  Fluid:  per MD  EDUCATION NEEDS:   No education needs identified at this time  Maureen ChattersKatie Sarann Tregre, RD, LDN Pager #: [phone] After-Hours Pager #: [phone]

## 2016-12-07 LAB — CBC
HEMATOCRIT: 37.3 % — AB (ref 39.0–52.0)
Hemoglobin: 11.8 g/dL — ABNORMAL LOW (ref 13.0–17.0)
MCH: 33.1 pg (ref 26.0–34.0)
MCHC: 31.6 g/dL (ref 30.0–36.0)
MCV: 104.8 fL — ABNORMAL HIGH (ref 78.0–100.0)
Platelets: 134 10*3/uL — ABNORMAL LOW (ref 150–400)
RBC: 3.56 MIL/uL — ABNORMAL LOW (ref 4.22–5.81)
RDW: 13.8 % (ref 11.5–15.5)
WBC: 9.6 10*3/uL (ref 4.0–10.5)

## 2016-12-07 LAB — BASIC METABOLIC PANEL
Anion gap: 11 (ref 5–15)
BUN: 23 mg/dL — ABNORMAL HIGH (ref 6–20)
CHLORIDE: 109 mmol/L (ref 101–111)
CO2: 26 mmol/L (ref 22–32)
Calcium: 8.7 mg/dL — ABNORMAL LOW (ref 8.9–10.3)
Creatinine, Ser: 0.65 mg/dL (ref 0.61–1.24)
GFR calc non Af Amer: >60 mL/min (ref >60)
Glucose, Bld: 126 mg/dL — ABNORMAL HIGH (ref 65–99)
Potassium: 3.4 mmol/L — ABNORMAL LOW (ref 3.5–5.1)
SODIUM: 146 mmol/L — AB (ref 135–145)

## 2016-12-07 LAB — MAGNESIUM: Magnesium: 1.9 mg/dL (ref 1.7–2.4)

## 2016-12-07 LAB — PHOSPHORUS: PHOSPHORUS: 1.9 mg/dL — AB (ref 2.5–4.6)

## 2016-12-07 LAB — GLUCOSE, CAPILLARY
GLUCOSE-CAPILLARY: 115 mg/dL — AB (ref 65–99)
GLUCOSE-CAPILLARY: 116 mg/dL — AB (ref 65–99)
GLUCOSE-CAPILLARY: 141 mg/dL — AB (ref 65–99)
GLUCOSE-CAPILLARY: 167 mg/dL — AB (ref 65–99)
Glucose-Capillary: 148 mg/dL — ABNORMAL HIGH (ref 65–99)

## 2016-12-07 LAB — PROTIME-INR
INR: 1.56
PROTHROMBIN TIME: 18.8 s — AB (ref 11.4–15.2)

## 2016-12-07 MED ORDER — POTASSIUM CHLORIDE 20 MEQ PO PACK
10.0000 meq | PACK | Freq: Every day | ORAL | Status: DC
  Administered 2016-12-07 – 2016-12-10 (×4): 10 meq via ORAL
  Filled 2016-12-07 (×5): qty 1

## 2016-12-07 MED ORDER — POTASSIUM CHLORIDE 20 MEQ/15ML (10%) PO SOLN
20.0000 meq | Freq: Once | ORAL | Status: AC
  Administered 2016-12-07: 20 meq
  Filled 2016-12-07: qty 15

## 2016-12-07 NOTE — Progress Notes (Signed)
Physical Therapy Treatment Patient Details Name: Alex DuhamelJohn R [name] MRN: [id number] DOB: 08/24/1931 Today's Date: 12/07/2016    History of Present Illness 83 year old male with right-sided subdural hematoma with seizures. PMHx: Aortic stenosis, COPD, Hx of CAD, Obesity, Obstructive sleep apnea, Afib.     PT Comments    Patient tolerated increased time EOB today, more alert and engaged during session. patient with improved arousal and able to follow some commands as well as verbalize orientation to self, month (february) and place ([place]). At this time, feel current POC remains appropriate. Will follow acutely.  Follow Up Recommendations  CIR     Equipment Recommendations   (TBD)    Recommendations for Other Services Rehab consult     Precautions / Restrictions Precautions Precautions: Fall (seizure) Restrictions Weight Bearing Restrictions: No    Mobility  Bed Mobility Overal bed mobility: Needs Assistance Bed Mobility: Supine to Sit;Sit to Supine     Supine to sit: Total assist;+2 for physical assistance Sit to supine: Total assist;+2 for physical assistance   General bed mobility comments: Pt initiating trunk movement for sit to supine but continues to require total assist for all aspects of bed mobililty.   Transfers                    Ambulation/Gait                 Stairs            Wheelchair Mobility    Modified Rankin (Stroke Patients Only) Modified Rankin (Stroke Patients Only) Pre-Morbid Rankin Score: No symptoms Modified Rankin: Severe disability     Balance Overall balance assessment: Needs assistance Sitting-balance support: Feet supported;Bilateral upper extremity supported Sitting balance-[name]: Zero Sitting balance - Comments: Strong posterior push in sitting despite cues for relaxation and anterior lean Postural control: Posterior lean                          Cognition Arousal/Alertness:  Awake/alert Behavior During Therapy: Flat affect Overall Cognitive Status: Impaired/Different from baseline Area of Impairment: Following commands;Safety/judgement       Following Commands: Follows one step commands inconsistently;Follows one step commands with increased time Safety/Judgement: Decreased awareness of safety;Decreased awareness of deficits     General Comments: patient with improved arousal and able to follow some commands as well as verbalize orientation to self, month (february) and place ([place])     Exercises      General Comments        Pertinent Vitals/Pain Pain Assessment: No/denies pain    Home Living Family/patient expects to be discharged to:: Private residence Living Arrangements: Spouse/significant other Available Help at Discharge: Family Type of Home: House Home Access: Stairs to enter Entrance Stairs-Rails: Right Home Layout: One level        Prior Function Level of Independence: Independent      Comments: drives, active on property at home   PT Goals (current goals can now be found in the care plan section) Acute Rehab PT Goals Patient Stated Goal: to get better PT Goal Formulation: With family Time For Goal Achievement: 12/13/16 Potential to Achieve Goals: Good Progress towards PT goals: Progressing toward goals    Frequency    Min 3X/week      PT Plan Current plan remains appropriate    Co-evaluation PT/OT/SLP Co-Evaluation/Treatment: Yes Reason for Co-Treatment: Complexity of the patient's impairments (multi-system involvement);For patient/therapist safety PT goals addressed during  session: Balance;Mobility/safety with mobility OT goals addressed during session: ADL's and self-care     End of Session Equipment Utilized During Treatment: Oxygen Activity Tolerance: Patient limited by fatigue;Patient limited by lethargy Patient left: in bed;with call bell/phone within reach;with bed alarm set;with family/visitor  present;with restraints reapplied     Time: 1610-9604 PT Time Calculation (min) (ACUTE ONLY): 21 min  Charges:  $Therapeutic Activity: 8-22 mins                    G Codes:      Fabio Asa 12-28-2016, 9:49 AM Charlotte Crumb, PT DPT  [phone]

## 2016-12-07 NOTE — Progress Notes (Signed)
eLink Physician-Brief Progress Note Patient Name: Alex DuhamelJohn R [name] DOB: [birth year] MRN: [id number]   Date of Service  12/07/2016  HPI/Events of Note  Reportedly patient attempting to get out of bed.   eICU Interventions  1. Ordering a soft waist belt  2. Fall risk precautions     Intervention Category Major Interventions: Delirium, psychosis, severe agitation - evaluation and management  Lawanda CousinsJennings Myrissa Chipley 12/07/2016, 3:28 PM

## 2016-12-07 NOTE — Progress Notes (Signed)
Pt seen and examined. No issues overnight.  No more seizures since yesterday morning  EXAM: Temp:  [97.7 F (36.5 C)-99 F (37.2 C)] 97.7 F (36.5 C) (02/11 0800) Pulse Rate:  [38-125] 92 (02/11 0800) Resp:  [14-24] 22 (02/11 0800) BP: (120-181)/(66-123) 137/81 (02/11 0800) SpO2:  [95 %-100 %] 100 % (02/11 0800) Weight:  [106.8 kg (235 lb 7.2 oz)] 106.8 kg (235 lb 7.2 oz) (02/11 0500) Intake/Output      02/10 0701 - 02/11 0700 02/11 0701 - 02/12 0700   I.V. (mL/kg) 1725 (16.2) 75 (0.7)   NG/GT 1425 75   IV Piggyback 220    Total Intake(mL/kg) 3370 (31.6) 150 (1.4)   Urine (mL/kg/hr) 950 (0.4)    Total Output 950     Net +2420 +150         More arousable today Incomprehensible words Moves all extremities spontaneously, R>L  Stable Continue current care

## 2016-12-07 NOTE — Progress Notes (Signed)
PULMONARY / CRITICAL CARE MEDICINE   Name: Alex [name] MRN: [id number] DOB: [birth year]    ADMISSION DATE:  12/03/2016 CONSULTATION DATE:  12/05/16  REFERRING MD:  Yetta Barre  CHIEF COMPLAINT:  Resp Distress  HISTORY OF PRESENT ILLNESS:  Pt is encephelopathic; therefore, this HPI is obtained from chart review. Alex [name] is a 83 y.o. male with PMH as outlined below. He was admitted 12/03/16 after he "fell in the yard" 2 days prior to that.  He had CT of the head in ED which showed acute right SDH.  He had been on coumadin for A.fib; this was reversed in ED and pt was then admitted to neuro ICU for further management.  He was started and maintained on precedex for [facility] withdrawal.  He had repeat CT 02/08 which was stable.  He had been doing well up until early AM hours 02/09, when he had an acute grand mal seizure.  He received 1mg  ativan and then later had increased WOB and decreased level of consciousness.  PCCM was subsequently called for further evaluation.   SUBJECTIVE:   More awake Weak cough afebrile  VITAL SIGNS: BP 137/81   Pulse 92   Temp 98.4 F (36.9 C) (Axillary)   Resp (!) 22   Ht 6\' 2"  (1.88 m)   Wt 235 lb 7.2 oz (106.8 kg)   SpO2 100%   BMI 30.23 kg/m   HEMODYNAMICS:    VENTILATOR SETTINGS:    INTAKE / OUTPUT: I/O last 3 completed shifts: In: 4377.5 [I.V.:2625; NG/GT:1425; IV Piggyback:327.5] Out: 1850 [Urine:1850]   PHYSICAL EXAMINATION: General: Elderly male, in mild respiratory distress. Neuro:awake, follows commands on right Flaccid on left  HEENT: [place]/AT. PERRL, sclerae anicteric. Cardiovascular: RRR, no M/R/G.  Lungs: Respirations even and mildly labored.  CTA bilaterally, No W/R/R. Mild increase in WOB. Abdomen: BS x 4, soft, NT/ND.  Musculoskeletal: No gross deformities, no edema.  Skin: Intact, warm, no rashes.  LABS:  BMET  Recent Labs Lab 12/03/16 0718 12/06/16 0851 12/07/16 0321  NA 139 145 146*  K 3.8 3.4* 3.4*  CL 100*  112* 109  CO2 29 24 26   BUN 30* 24* 23*  CREATININE 0.93 0.69 0.65  GLUCOSE 124* 95 126*    Electrolytes  Recent Labs Lab 12/03/16 0718 12/06/16 0851 12/07/16 0321  CALCIUM 9.1 8.3* 8.7*  MG  --   --  1.9  PHOS  --   --  1.9*    CBC  Recent Labs Lab 12/03/16 0718 12/06/16 0851 12/07/16 0321  WBC 10.3 12.6* 9.6  HGB 13.1 11.0* 11.8*  HCT 38.1* 34.8* 37.3*  PLT 152 123* 134*    Coag's  Recent Labs Lab 12/05/16 0256 12/06/16 0436 12/07/16 0321  INR 1.44 1.47 1.56    Sepsis Markers No results for input(s): LATICACIDVEN, PROCALCITON, O2SATVEN in the last 168 hours.  ABG  Recent Labs Lab 12/05/16 0543  PHART 7.478*  PCO2ART 32.2  PO2ART 121.0*    Liver Enzymes  Recent Labs Lab 12/01/16 0930 12/03/16 0718  AST 31 35  ALT 18 19  ALKPHOS 57 56  BILITOT 0.8 1.2  ALBUMIN 3.6 4.0    Cardiac Enzymes  Recent Labs Lab 12/03/16 0718 12/03/16 2256  TROPONINI <0.03 0.03*    Glucose  Recent Labs Lab 12/05/16 0452 12/06/16 1557 12/06/16 1954 12/06/16 2315 12/07/16 0322 12/07/16 0752  GLUCAP 120* 104* 116* 128* 116* 115*    Imaging Dg Abd Portable 1v  Result Date: 12/06/2016 CLINICAL DATA:  Feeding tube placement EXAM: PORTABLE ABDOMEN - 1 VIEW COMPARISON:  08/10/2011 FINDINGS: Feeding tube is in place with the tip in the distal duodenum near the ligament of Treitz. IMPRESSION: Feeding tube tip in the distal duodenum. Electronically Signed   By: Charlett NoseKevin  Dover M.D.   On: 12/06/2016 10:18     STUDIES:  CT head 02/08 > stable CT compared to prior (stable acute right SDH). CT head 02/09 >Right convexity subdural hematoma is stable to mildly decreased in size   EEG 2/9 >> rt seizure focus, Occasional low amplitude epileptiform discharges over the right posterior temporal region Three clinicoelectrographic seizures captured arising from the right hemisphere lasting 2-4 minutes  CULTURES: None.  ANTIBIOTICS: None.  SIGNIFICANT  EVENTS: 02/07 > admit. 02/09 > seizure > resp distress after 1mg  ativan.  LINES/TUBES: None.  DISCUSSION: 83 y.o. male admitted 02/07 with acute right SDH.  Had grand mal seizure early AM 02/09, received 1mg  ativan then had increased WOB.    ASSESSMENT / PLAN:  NEUROLOGIC A:   Acute right SDH - being followed by neurosurgery. Acute grand mal seizure  Acute encephalopathy - ? Ativan related vs post ictal. Hx EtoH abuse - has been on prophylactic precedex since admission. P:   Neurosurgery following. DC  precedex given hypersomnolence & bradycardia - use ativan prn Continue keppra - Add second agent if any more seizures. Thiamine / Folate. Hold preadmission alprazolam, escitalopram, norco, zolpidem.  PULMONARY A: Acute resp failure - DNR/I Hx COPD per report (PFT's from 2014 do not show true obstruction), OSA. P:   Off  BiPAP Continue BD's.   CARDIOVASCULAR A:  Hx A.fib (on coumadin; currently being held due to SDH), HLD, CAD, AS s/p AVR. P:  Continue to hold coumadin. Continue preadmisson amio, metoprolol   RENAL A:   hypokalemia P:   replete  GASTROINTESTINAL A:   Nutrition. P:   NPO. Started TFs  HEMATOLOGIC A:   VTE Prophylaxis. P:  SCD's. CBC in AM.  INFECTIOUS A:   No indication of infection. P:   Monitor clinically.  ENDOCRINE A:   No acute issues.   P:   No interventions required.    d/w wife 2/9 -DNR/I issued, updated 2/11  Interdisciplinary Family Meeting v Palliative Care Meeting: done 2/9   Cyril Mourningakesh Alva MD. [facility].  Pulmonary & Critical care Pager [phone] If no response call [phone]    12/07/2016, 8:08 AM

## 2016-12-07 NOTE — Evaluation (Addendum)
Occupational Therapy Evaluation Patient Details Name: Alex DuhamelJohn R [name] MRN: [id number] DOB: 27-Dec-1930 Today's Date: 12/07/2016    History of Present Illness 83 year old male with right-sided subdural hematoma with seizures. PMHx: Aortic stenosis, COPD, Hx of CAD, Obesity, Obstructive sleep apnea, Afib.    Clinical Impression   Per wife, pt independent with ADL PTA. Currently pt total assist for ADL and bed mobility. Pt oriented to person, place, and time today and inconsistently responding to one step commands with increased time. Pt presenting with L sided weakness/decreased ROM/and impaired sensation, potential visual deficits, cognitive impairments, impaired communication, and poor sitting balance impacting his independence and safety with ADL and functional mobility. Recommending CIR level therapies to maximize independence and safety with ADL and functional mobility prior to return home. Pt would benefit from continued skilled OT to address established goals.    Follow Up Recommendations  CIR;Supervision/Assistance - 24 hour    Equipment Recommendations  Other (comment) (TBD at next venue)    Recommendations for Other Services Rehab consult     Precautions / Restrictions Precautions Precautions: Fall (seizure) Restrictions Weight Bearing Restrictions: No      Mobility Bed Mobility Overal bed mobility: Needs Assistance Bed Mobility: Supine to Sit;Sit to Supine     Supine to sit: Total assist;+2 for physical assistance Sit to supine: Total assist;+2 for physical assistance   General bed mobility comments: Pt initiating trunk movement for sit to supine but continues to require total assist for all aspects of bed mobililty.   Transfers                      Balance Overall balance assessment: Needs assistance Sitting-balance support: Feet supported;Bilateral upper extremity supported Sitting balance-[name]: Zero Sitting balance - Comments: Strong posterior  push in sitting despite cues for relaxation and anterior lean Postural control: Posterior lean                                  ADL Overall ADL's : Needs assistance/impaired Eating/Feeding: NPO                                     General ADL Comments: Total assist for ADL at this time. Tolerated sitting EOB 10 minutes with VSS throughout; total assist provided for blocking knees and posterior push.     Vision Additional Comments: Difficult to assess due to impaired communication/cognition. Pt seems to have slight disconjugate gaze. Reports no diplopia.   Perception     Praxis      Pertinent Vitals/Pain Pain Assessment: No/denies pain     Hand Dominance Right   Extremity/Trunk Assessment Upper Extremity Assessment Upper Extremity Assessment: LUE deficits/detail LUE Deficits / Details: Pt noted to have functional AROM in LUE, does not purposefully move to command. Increased edema noted. LUE Sensation: decreased light touch LUE Coordination: decreased fine motor;decreased gross motor   Lower Extremity Assessment Lower Extremity Assessment: Defer to PT evaluation   Cervical / Trunk Assessment Cervical / Trunk Assessment: Normal   Communication     Cognition Arousal/Alertness: Awake/alert Behavior During Therapy: Flat affect Overall Cognitive Status: Impaired/Different from baseline Area of Impairment: Following commands;Safety/judgement       Following Commands: Follows one step commands inconsistently;Follows one step commands with increased time Safety/Judgement: Decreased awareness of safety;Decreased awareness of deficits  General Comments: Pt oriented to person, place, and month. Seems to be appropriately answering questions but difficult to understand due to impaired speech.   General Comments       Exercises       Shoulder Instructions      Home Living Family/patient expects to be discharged to:: Private  residence Living Arrangements: Spouse/significant other Available Help at Discharge: Family Type of Home: House Home Access: Stairs to enter Secretary/administrator of Steps: 3 Entrance Stairs-Rails: Right Home Layout: One level     Bathroom Shower/Tub: Walk-in shower                    Prior Functioning/Environment Level of Independence: Independent        Comments: drives, active on property at home        OT Problem List: Decreased strength;Decreased range of motion;Decreased activity tolerance;Impaired balance (sitting and/or standing);Impaired vision/perception;Decreased coordination;Decreased cognition;Decreased safety awareness;Decreased knowledge of use of DME or AE;Decreased knowledge of precautions;Impaired sensation;Impaired tone;Obesity;Impaired UE functional use;Increased edema   OT Treatment/Interventions: Self-care/ADL training;Therapeutic exercise;Neuromuscular education;Energy conservation;DME and/or AE instruction;Therapeutic activities;Cognitive remediation/compensation;Visual/perceptual remediation/compensation;Patient/family education;Balance training    OT Goals(Current goals can be found in the care plan section) Acute Rehab OT Goals Patient Stated Goal: to get better OT Goal Formulation: With patient/family Time For Goal Achievement: 12/21/16 Potential to Achieve Goals: Good ADL Goals Pt Will Perform Grooming: with min assist;sitting Pt Will Transfer to Toilet: with mod assist;stand pivot transfer;bedside commode Additional ADL Goal #1: Pt will sit EOB 10 minutes with min assist as precursor to ADL. Additional ADL Goal #2: Pt will follow one step command 100% of the time with min verbal cues.  OT Frequency: Min 3X/week   Barriers to D/C:            Co-evaluation PT/OT/SLP Co-Evaluation/Treatment: Yes Reason for Co-Treatment: Complexity of the patient's impairments (multi-system involvement);For patient/therapist safety   OT goals addressed  during session: ADL's and self-care      End of Session Equipment Utilized During Treatment: Oxygen Nurse Communication: Mobility status  Activity Tolerance: Patient tolerated treatment well Patient left: in bed;with call bell/phone within reach;with family/visitor present   Time: 1610-9604 OT Time Calculation (min): 21 min Charges:  OT General Charges $OT Visit: 1 Procedure OT Evaluation $OT Eval Moderate Complexity: 1 Procedure G-Codes:     Gaye Alken M.S., OTR/L Pager: [phone]  12/07/2016, 9:21 AM

## 2016-12-07 NOTE — Progress Notes (Signed)
Pt's wedding band given to his wife to take home.

## 2016-12-07 NOTE — Progress Notes (Signed)
SLP Cancellation Note  Patient Details Name: Alex DuhamelJohn R [name] MRN: [id number] DOB: November 24, 1930   Cancelled treatment:       Reason Eval/Treat Not Completed: Patient's level of consciousness. Per chart pt not yet alert for SLP interventions. Will follow for readiness.    Hunter Pinkard, Riley NearingBonnie Caroline 12/07/2016, 7:25 AM

## 2016-12-08 DIAGNOSIS — S065X0A Traumatic subdural hemorrhage without loss of consciousness, initial encounter: Secondary | ICD-10-CM

## 2016-12-08 LAB — CBC
HEMATOCRIT: 36 % — AB (ref 39.0–52.0)
HEMOGLOBIN: 11.5 g/dL — AB (ref 13.0–17.0)
MCH: 33.4 pg (ref 26.0–34.0)
MCHC: 31.9 g/dL (ref 30.0–36.0)
MCV: 104.7 fL — ABNORMAL HIGH (ref 78.0–100.0)
Platelets: 140 10*3/uL — ABNORMAL LOW (ref 150–400)
RBC: 3.44 MIL/uL — AB (ref 4.22–5.81)
RDW: 13.8 % (ref 11.5–15.5)
WBC: 11.1 10*3/uL — ABNORMAL HIGH (ref 4.0–10.5)

## 2016-12-08 LAB — GLUCOSE, CAPILLARY
GLUCOSE-CAPILLARY: 136 mg/dL — AB (ref 65–99)
GLUCOSE-CAPILLARY: 154 mg/dL — AB (ref 65–99)
GLUCOSE-CAPILLARY: 130 mg/dL — AB (ref 65–99)
Glucose-Capillary: 149 mg/dL — ABNORMAL HIGH (ref 65–99)
Glucose-Capillary: 140 mg/dL — ABNORMAL HIGH (ref 65–99)
Glucose-Capillary: 142 mg/dL — ABNORMAL HIGH (ref 65–99)

## 2016-12-08 LAB — BASIC METABOLIC PANEL
ANION GAP: 9 (ref 5–15)
BUN: 20 mg/dL (ref 6–20)
CHLORIDE: 112 mmol/L — AB (ref 101–111)
CO2: 25 mmol/L (ref 22–32)
Calcium: 8.4 mg/dL — ABNORMAL LOW (ref 8.9–10.3)
Creatinine, Ser: 0.63 mg/dL (ref 0.61–1.24)
GFR calc non Af Amer: >60 mL/min (ref >60)
Glucose, Bld: 141 mg/dL — ABNORMAL HIGH (ref 65–99)
Potassium: 3.3 mmol/L — ABNORMAL LOW (ref 3.5–5.1)
SODIUM: 146 mmol/L — AB (ref 135–145)

## 2016-12-08 LAB — PROTIME-INR
INR: 1.65
Prothrombin Time: 19.7 seconds — ABNORMAL HIGH (ref 11.4–15.2)

## 2016-12-08 MED ORDER — POTASSIUM CHLORIDE 20 MEQ/15ML (10%) PO SOLN
40.0000 meq | Freq: Once | ORAL | Status: AC
  Administered 2016-12-08: 40 meq
  Filled 2016-12-08: qty 30

## 2016-12-08 MED ORDER — INSULIN ASPART 100 UNIT/ML ~~LOC~~ SOLN
0.0000 [IU] | SUBCUTANEOUS | Status: DC
  Administered 2016-12-08 – 2016-12-09 (×5): 2 [IU] via SUBCUTANEOUS
  Administered 2016-12-09: 3 [IU] via SUBCUTANEOUS
  Administered 2016-12-09 – 2016-12-10 (×5): 2 [IU] via SUBCUTANEOUS

## 2016-12-08 MED ORDER — PRO-STAT SUGAR FREE PO LIQD
30.0000 mL | Freq: Three times a day (TID) | ORAL | Status: DC
  Administered 2016-12-08 – 2016-12-11 (×9): 30 mL
  Filled 2016-12-08 (×9): qty 30

## 2016-12-08 MED ORDER — JEVITY 1.2 CAL PO LIQD
1000.0000 mL | ORAL | Status: DC
Start: 2016-12-08 — End: 2016-12-11
  Administered 2016-12-08 – 2016-12-09 (×2)
  Administered 2016-12-10: 1000 mL
  Filled 2016-12-08 (×9): qty 1000

## 2016-12-08 MED ORDER — IPRATROPIUM-ALBUTEROL 0.5-2.5 (3) MG/3ML IN SOLN
3.0000 mL | Freq: Four times a day (QID) | RESPIRATORY_TRACT | Status: DC
  Administered 2016-12-08 – 2016-12-10 (×11): 3 mL via RESPIRATORY_TRACT
  Filled 2016-12-08 (×10): qty 3

## 2016-12-08 NOTE — Progress Notes (Signed)
eLink Physician-Brief Progress Note Patient Name: Alex DuhamelJohn R [name] DOB: [birth year] MRN: [id number]   Date of Service  12/08/2016  HPI/Events of Note    eICU Interventions  K replaced     Intervention Category Minor Interventions: Electrolytes abnormality - evaluation and management  Tanya Crothers S. 12/08/2016, 6:47 AM

## 2016-12-08 NOTE — Progress Notes (Signed)
Nutrition Follow-up  INTERVENTION:   D/C Vital AF 1.2  Jevity 1.2 @ 65 ml/hr (1560 ml/day) 30 ml Prostat TID Provides: 2172 kcal, 131 grams protein, and 1263 ml free water.    NUTRITION DIAGNOSIS:   Inadequate oral intake related to inability to eat as evidenced by NPO status. Ongoing.   GOAL:   Patient will meet greater than or equal to 90% of their needs Met.   MONITOR:   TF tolerance, I & O's, Labs, Diet advancement  ASSESSMENT:   Pt with hx of ETOH and admitted 12/03/16 after he "fell in the yard" 2 days prior to that.  He had CT of the head in ED which showed acute right SDH.   2/9 grand mal seizure, intubated but now extubated 2/10 Cortrak placed and TF started Failed swallow evaluation after extubation  Medications reviewed and include: folic acid, thiamine, senokot-s Labs reviewed: K+ 3.3 CBG's: [phone]   Diet Order:  Diet NPO time specified  Skin:  Reviewed, no issues  Last BM:  unknown  Height:   Ht Readings from Last 1 Encounters:  12/06/16 _0  (1.88 m)    Weight:   Wt Readings from Last 1 Encounters:  12/08/16 240 lb 15.4 oz (109.3 kg)    Ideal Body Weight:  86.3 kg  BMI:  Body mass index is 30.94 kg/m.  Estimated Nutritional Needs:   Kcal:  2100-2300  Protein:  130-140 gm  Fluid:  per MD  EDUCATION NEEDS:   No education needs identified at this time  [place], [place], [place] Pager [phone] After Hours Pager

## 2016-12-08 NOTE — Progress Notes (Signed)
PULMONARY / CRITICAL CARE MEDICINE   Name: Alex [name] MRN: [id number] DOB: January 25, 1931    ADMISSION DATE:  12/03/2016 CONSULTATION DATE:  12/05/16  REFERRING MD:  Yetta Barre  CHIEF COMPLAINT:  Resp Distress   SUBJECTIVE:   No acute events.  Has not needed BiPAP.  Much more awake now.  VITAL SIGNS: BP (!) 150/105   Pulse 92   Temp 97.8 F (36.6 C) (Axillary)   Resp (!) 30   Ht 6\' 2"  (1.88 m)   Wt 109.3 kg (240 lb 15.4 oz)   SpO2 95%   BMI 30.94 kg/m   HEMODYNAMICS:    VENTILATOR SETTINGS:    INTAKE / OUTPUT: I/O last 3 completed shifts: In: 5655 [I.V.:2700; NG/GT:2625; IV Piggyback:330] Out: 2100 [Urine:2100]   PHYSICAL EXAMINATION: General: Adult male, resting in bed, in NAD. Neuro: Awake but not able to follow commands yet. HEENT: [place]/AT. PERRL, sclerae anicteric. Cardiovascular: RRR, no M/R/G.  Lungs: Respirations even and unlabored.  CTA bilaterally, No W/R/R.  Abdomen: Obese, BS x 4, soft, NT/ND.  Musculoskeletal: No gross deformities, no edema.  Skin: Intact, warm, no rashes.   LABS:  BMET  Recent Labs Lab 12/06/16 0851 12/07/16 0321 12/08/16 0257  NA 145 146* 146*  K 3.4* 3.4* 3.3*  CL 112* 109 112*  CO2 24 26 25   BUN 24* 23* 20  CREATININE 0.69 0.65 0.63  GLUCOSE 95 126* 141*    Electrolytes  Recent Labs Lab 12/06/16 0851 12/07/16 0321 12/08/16 0257  CALCIUM 8.3* 8.7* 8.4*  MG  --  1.9  --   PHOS  --  1.9*  --     CBC  Recent Labs Lab 12/06/16 0851 12/07/16 0321 12/08/16 0257  WBC 12.6* 9.6 11.1*  HGB 11.0* 11.8* 11.5*  HCT 34.8* 37.3* 36.0*  PLT 123* 134* 140*    Coag's  Recent Labs Lab 12/06/16 0436 12/07/16 0321 12/08/16 0257  INR 1.47 1.56 1.65    Sepsis Markers No results for input(s): LATICACIDVEN, PROCALCITON, O2SATVEN in the last 168 hours.  ABG  Recent Labs Lab 12/05/16 0543  PHART 7.478*  PCO2ART 32.2  PO2ART 121.0*    Liver Enzymes  Recent Labs Lab 12/01/16 0930 12/03/16 0718  AST 31  35  ALT 18 19  ALKPHOS 57 56  BILITOT 0.8 1.2  ALBUMIN 3.6 4.0    Cardiac Enzymes  Recent Labs Lab 12/03/16 0718 12/03/16 2256  TROPONINI <0.03 0.03*    Glucose  Recent Labs Lab 12/07/16 0322 12/07/16 0752 12/07/16 1141 12/07/16 1944 12/07/16 2351 12/08/16 0330  GLUCAP 116* 115* 141* 148* 167* 136*    Imaging No results found.   STUDIES:  CT head 02/08 > stable CT compared to prior (stable acute right SDH). CT head 02/09 >Right convexity subdural hematoma is stable to mildly decreased in size   EEG 2/9 >> rt seizure focus, Occasional low amplitude epileptiform discharges over the right posterior temporal region Three clinicoelectrographic seizures captured arising from the right hemisphere lasting 2-4 minutes  CULTURES: None.  ANTIBIOTICS: None.  SIGNIFICANT EVENTS: 02/07 > admit. 02/09 > seizure > resp distress after 1mg  ativan.  LINES/TUBES: None.  DISCUSSION: 83 y.o. male admitted 02/07 with acute right SDH.  Had grand mal seizure early AM 02/09, received 1mg  ativan then had increased WOB.    ASSESSMENT / PLAN:  NEUROLOGIC A:   Acute right SDH - being followed by neurosurgery. Acute grand mal seizure. Acute encephalopathy - ? Ativan related vs post ictal. Hx EtoH abuse -  has been on prophylactic precedex since admission. P:   Neurosurgery following. DC  precedex given hypersomnolence & bradycardia - use ativan prn. Continue keppra - Add second agent if any more seizures. Thiamine / Folate. Hold preadmission alprazolam, escitalopram, norco, zolpidem.  PULMONARY A: Acute resp failure - DNR/I.  Needed BiPAP initially but now off and continues to not need. Hx COPD per report (PFT's from 2014 do not show true obstruction), OSA. P:   Pulm hygiene. Continue BD's.  CARDIOVASCULAR A:  Hx A.fib (on coumadin; currently being held due to SDH), HLD, CAD, AS s/p AVR. P:  Continue to hold coumadin. Continue preadmisson amio,  metoprolol.  RENAL A:   Hypokalemia - s/p repletion 02/12. P:   Correct electrolytes as indicated. BMP in AM.  GASTROINTESTINAL A:   Nutrition. P:   NPO. Started TFs.  HEMATOLOGIC A:   VTE Prophylaxis. P:  SCD's. CBC in AM.  INFECTIOUS A:   No indication of infection. P:   Monitor clinically.  ENDOCRINE A:   No acute issues.   P:   No interventions required.   No family at bedside. Interdisciplinary Family Meeting v Palliative Care Meeting: done 2/9.   PCCM will sign off.  Please do not hesitate to call us back if we can be of any further assistance.    Rutherford Guysahul Desai, [place]PA - C [facility] Pager: [phone]  or [phone] 12/08/2016, 7:26 AM   ATTENDING NOTE / ATTESTATION NOTE :   I have discussed the case with the resident/APP  Rutherford Guysahul Desai PA.   I agree with the resident/APP's  history, physical examination, assessment, and plans.    I have edited the above note and modified it according to our agreed history, physical examination, assessment and plan.   Briefly, Alex [name] is a 83 y.o. male with PMH as outlined below. He was admitted 12/03/16 after he "fell in the yard" 2 days prior to that.  He had CT of the head in ED which showed acute right SDH.  He had been on coumadin for A.fib; this was reversed in ED and pt was then admitted to neuro ICU for further management. [facility]  He had repeat CT 02/08 which was stable.  He had been doing well up until early AM hours 02/09, when he had an acute grand mal seizure.  He received 1mg  ativan and then later had increased WOB and decreased level of consciousness. PCCM was subsequently called for further evaluation.  Slowly improving over the weekend.  Bipap weaned off. (-) recurrence of szes. Withdrawals and agitation better. precedex has been weaned off. Gets prn ativan.   Vitals:  Vitals:   12/08/16 0800 12/08/16  0900 12/08/16 0908 12/08/16 1000  BP: 127/63 (!) 163/80 (!) 163/80 (!) 141/107  Pulse: (!) 47 (!) 50  (!) 50  Resp: 20 (!) 21  (!) 28  Temp: 98.2 F (36.8 C)     TempSrc: Axillary     SpO2: 95% 99%  99%  Weight:      Height:        Constitutional/General: well-nourished, well-developed, NAD. Confused. Has dubhoff tube. Tolerating TF.  Body mass index is 30.94 kg/m. Wt Readings from Last 3 Encounters:  12/08/16 109.3 kg (240 lb 15.4 oz)  12/01/16 105.2 kg (232 lb)  11/26/16 109.1 kg (240 lb 9.6 oz)    HEENT: PERLA, anicteric sclerae. (-) Oral  thrush.  Neck: No masses. Midline trachea. No JVD, (-) LAD. (-) bruits appreciated.  Respiratory/Chest: Grossly normal chest. (-) deformity. (-) Accessory muscle use.  Symmetric expansion. Diminished BS on both lower lung zones. (-) wheezing, crackles, Some rhonhi at the bases.  (-) egophony  Cardiovascular: Regular rate and  rhythm, heart sounds normal, no murmur or gallops,  Trace peripheral edema  Gastrointestinal:  Normal bowel sounds. Soft, non-tender. No hepatosplenomegaly.  (-) masses.   Musculoskeletal:  Normal muscle tone.   Extremities: Grossly normal. (-) clubbing, cyanosis.  (-) edema  Skin: (-) rash,lesions seen.   Neurological/Psychiatric : CN grossly intact. (-) lateralizing signs. Confused, answers simple questions.     CBC Recent Labs     12/06/16  0851  12/07/16  0321  12/08/16  0257  WBC  12.6*  9.6  11.1*  HGB  11.0*  11.8*  11.5*  HCT  34.8*  37.3*  36.0*  PLT  123*  134*  140*    Coag's Recent Labs     12/06/16  0436  12/07/16  0321  12/08/16  0257  INR  1.47  1.56  1.65    BMET Recent Labs     12/06/16  0851  12/07/16  0321  12/08/16  0257  NA  145  146*  146*  K  3.4*  3.4*  3.3*  CL  112*  109  112*  CO2  24  26  25   BUN  24*  23*  20  CREATININE  0.69  0.65  0.63  GLUCOSE  95  126*  141*    Electrolytes Recent Labs     12/06/16  0851  12/07/16  0321  12/08/16  0257   CALCIUM  8.3*  8.7*  8.4*  MG   --   1.9   --   PHOS   --   1.9*   --     Sepsis Markers No results for input(s): PROCALCITON, O2SATVEN in the last 72 hours.  Invalid input(s): LACTICACIDVEN  ABG No results for input(s): PHART, PCO2ART, PO2ART in the last 72 hours.  Liver Enzymes No results for input(s): AST, ALT, ALKPHOS, BILITOT, ALBUMIN in the last 72 hours.  Cardiac Enzymes No results for input(s): TROPONINI, PROBNP in the last 72 hours.  Glucose Recent Labs     12/07/16  1141  12/07/16  1944  12/07/16  2351  12/08/16  0330  12/08/16  0823  12/08/16  1130  GLUCAP  141*  148*  167*  136*  154*  149*    Imaging No results found.  Assessment/Plan : Acute right SDH  Acute grand mal seizure Acute encephalopathy - ? Ativan related vs post ictal. Hx EtoH abuse - per neurosurgery - off precedex drip - ativan prn  H/o COPD. Pt with OSA - keep o2 sats > 88% - Duoneb QID to help with mucus clearance  Afib. HTN - off OAC 2/2 fall and SDH - continue lopressor and amiodarone.    Family :Family updated at length today.  Spoke with wife and daughter in Social worker.  PCCM will sign off for now. pls call back if with issues.    Pollie Meyer, MD 12/08/2016, 11:50 AM [facility] Pager [phone] After 3 pm or if no answer, call [phone]

## 2016-12-08 NOTE — Progress Notes (Signed)
Occupational Therapy Treatment Patient Details Name: Alex DuhamelJohn R [name] MRN: [id number] DOB: 01-Nov-1930 Today's Date: 12/08/2016    History of present illness 83 year old male with right-sided subdural hematoma with seizures. PMHx: Aortic stenosis, COPD, Hx of CAD, Obesity, Obstructive sleep apnea, Afib.    OT comments  This 83 yo male admitted with above presents to acute OT today making progress with grooming sitting EOB and  following commands. He will continue to benefit from acute OT with follow up OT on CIR--pt was quite active prior to  this admission and thus would greatly benefit from follow up OT on CIR.  Follow Up Recommendations  CIR;Supervision/Assistance - 24 hour    Equipment Recommendations  Other (comment) (TBD next venue)    Recommendations for Other Services Rehab consult    Precautions / Restrictions Precautions Precautions: Fall Restrictions Weight Bearing Restrictions: No       Mobility Bed Mobility Overal bed mobility: Needs Assistance Bed Mobility: Supine to Sit;Sit to Supine Rolling: Total assist;+2 for physical assistance   Supine to sit: +2 for physical assistance;Max assist Sit to supine: +2 for physical assistance;Max assist   General bed mobility comments: Pt required multiple cues and increased time for movement initiation; able to initiate trunk and extremity movement, requiring maxA x2 for sup<>sit  Transfers Overall transfer level: Needs assistance Equipment used: None Transfers: Sit to/from Stand Sit to Stand: Max assist;+2 physical assistance         General transfer comment: MaxA x2 for sit-to-stand with bilat UE support on back of chair; pads used for pelvic control, and L knee block required for buckling.     Balance Overall balance assessment: Needs assistance Sitting-balance support: Feet supported;Bilateral upper extremity supported Sitting balance-[name]: Zero Sitting balance - Comments: Posterior pushing intermittently  when sitting EOB; able to lean forward with multiple tactile and visual cues. Postural control: Posterior lean Standing balance support: Bilateral upper extremity supported Standing balance-[name]: Zero Standing balance comment: Pt requires maxA on left side with bilat UE supported on chair back in standing. Able to weight shift R/L without clearing feet.                    ADL Overall ADL's : Needs assistance/impaired       Grooming Details (indicate cue type and reason): Able to wash face with RUE once wash cloth presented. Pt was asking about toothbrush when gave him a swab with mouthwash on it it rubbed it on his lips despite telling him it was like a toothbrush                                      Vision                 Additional Comments: Pt would look left but could not maintain left gaze          Cognition   Behavior During Therapy: [facility] for tasks assessed/performed Overall Cognitive Status: Impaired/Different from baseline Area of Impairment: Following commands;Safety/judgement        Following Commands: Follows one step commands inconsistently;Follows one step commands with increased time Safety/Judgement: Decreased awareness of safety;Decreased awareness of deficits     General Comments: Pt continues to have improved arousal and able to follow some commands; most speech is still difficult to understand.  Pertinent Vitals/ Pain       Pain Assessment: Faces Faces Pain Scale: No hurt         Frequency  Min 3X/week        Progress Toward Goals  OT Goals(current goals can now be found in the care plan section)  Progress towards OT goals: Progressing toward goals     Plan Discharge plan remains appropriate    Co-evaluation    PT/OT/SLP Co-Evaluation/Treatment: Yes Reason for Co-Treatment: Complexity of the patient's impairments (multi-system involvement);For patient/therapist safety PT goals  addressed during session: Mobility/safety with mobility;Balance OT goals addressed during session: ADL's and self-care;Strengthening/ROM      End of Session Equipment Utilized During Treatment: Gait belt   Activity Tolerance     Patient Left in bed;with call bell/phone within reach;with family/visitor present;with restraints reapplied (right hand mit)   Nurse Communication  (how he did during session)        Time: 9604-5409 OT Time Calculation (min): 32 min  Charges: OT General Charges $OT Visit: 1 Procedure OT Treatments $Self Care/Home Management : 8-22 mins  Evette Georges [phone] 12/08/2016, 2:51 PM

## 2016-12-08 NOTE — Progress Notes (Signed)
Physical Therapy Treatment Patient Details Name: Alex [name] MRN: [id number] DOB: [birth year] Today's Date: 12/08/2016    History of Present Illness 83 year old male with right-sided subdural hematoma with seizures. PMHx: Aortic stenosis, COPD, Hx of CAD, Obesity, Obstructive sleep apnea, Afib.     PT Comments    Pt alert and engaged during session today, able to tolerate sitting EOB and standing with maxA. Pt required increased time and multiple cues to follow one-step commands. Required quiet environment without distraction to participate with PT. Pt responded well to wife being included in session. Pt limited during treatment by fatigue. Current POC remains appropriate. Will continue to follow acutely.   Follow Up Recommendations  CIR     Equipment Recommendations       Recommendations for Other Services       Precautions / Restrictions Precautions Precautions: Fall Restrictions Weight Bearing Restrictions: No    Mobility  Bed Mobility Overal bed mobility: Needs Assistance Bed Mobility: Supine to Sit;Sit to Supine Rolling: Total assist;+2 for physical assistance   Supine to sit: +2 for physical assistance;Max assist Sit to supine: +2 for physical assistance;Max assist   General bed mobility comments: Pt required multiple cues and increased time for movement initiation; able to initiate trunk and extremity movement, requiring maxA x2 for sup<>sit  Transfers Overall transfer level: Needs assistance Equipment used: None Transfers: Sit to/from Stand Sit to Stand: Max assist;+2 physical assistance         General transfer comment: MaxA x2 for sit-to-stand with bilat UE support on back of chair; pads used for pelvic control, and L knee block required for buckling.   Ambulation/Gait                 Stairs            Wheelchair Mobility    Modified Rankin (Stroke Patients Only) Modified Rankin (Stroke Patients Only) Pre-Morbid Rankin Score: No  symptoms Modified Rankin: Severe disability     Balance Overall balance assessment: Needs assistance Sitting-balance support: Feet supported;Bilateral upper extremity supported Sitting balance-[name]: Zero Sitting balance - Comments: Posterior pushing intermittently when sitting EOB; able to lean forward with multiple tactile and visual cues. Postural control: Posterior lean Standing balance support: Bilateral upper extremity supported Standing balance-[name]: Zero Standing balance comment: Pt requires maxA on left side with bilat UE supported on chair back in standing. Able to weight shift R/L without clearing feet.                     Cognition Arousal/Alertness: Awake/alert Behavior During Therapy: WFL for tasks assessed/performed Overall Cognitive Status: Impaired/Different from baseline Area of Impairment: Following commands;Safety/judgement       Following Commands: Follows one step commands inconsistently;Follows one step commands with increased time Safety/Judgement: Decreased awareness of safety;Decreased awareness of deficits     General Comments: Pt continues to have improved arousal and able to follow some commands; most speech is still difficult to understand.     Exercises      General Comments General comments (skin integrity, edema, etc.): SpO2 remained >90% on RA with activity.       Pertinent Vitals/Pain Pain Assessment: Faces Faces Pain Scale: No hurt    Home Living                      Prior Function            PT Goals (current goals can now be found  in the care plan section) Progress towards PT goals: Progressing toward goals    Frequency    Min 3X/week      PT Plan Current plan remains appropriate    Co-evaluation PT/OT/SLP Co-Evaluation/Treatment: Yes Reason for Co-Treatment: Complexity of the patient's impairments (multi-system involvement);For patient/therapist safety;To address functional/ADL  transfers PT goals addressed during session: Mobility/safety with mobility;Balance OT goals addressed during session: ADL's and self-care;Strengthening/ROM     End of Session Equipment Utilized During Treatment: Gait belt Activity Tolerance: Patient limited by fatigue Patient left: in bed;with call bell/phone within reach;with bed alarm set;with family/visitor present;with restraints reapplied     Time: [id number] PT Time Calculation (min) (ACUTE ONLY): 31 min  Charges:  $Therapeutic Activity: 8-22 mins                    G Codes:      Dewayne HatchJaclyn Leigh Calyn Sivils, SPT [phone]  Ina HomesJaclyn Jamarri Vuncannon 12/08/2016, 4:29 PM

## 2016-12-08 NOTE — Evaluation (Signed)
Speech Language Pathology Evaluation Patient Details Name: Alex [name] MRN: [id number] DOB: 09-26-1931 Today's Date: 12/08/2016 Time: 4098-1191 SLP Time Calculation (min) (ACUTE ONLY): 28 min  Problem List:  Patient Active Problem List   Diagnosis Date Noted  . Change in mental status   . Subdural hematoma (HCC) 12/03/2016  . Sick sinus syndrome (HCC) 11/06/2015  . PAF (paroxysmal atrial fibrillation) (HCC) 11/14/2014  . SOB (shortness of breath) 10/31/2014  . Persistent atrial fibrillation (HCC) 10/31/2014  . Aortic stenosis, severe 07/28/2011  . 3-vessel coronary artery disease 07/28/2011  . Hyperlipidemia, mixed 07/28/2011   Past Medical History:  Past Medical History:  Diagnosis Date  . Aortic stenosis    s/p AVR by Dr Laneta Simmers  . COPD (chronic obstructive pulmonary disease) (HCC)   . History of coronary artery disease    status post stenting of the marginal circumflex in 12/2003 and again in 2009  . Hyperlipidemia   . Morbid obesity (HCC)    weight 243 pounds, BMI 31.2kg/m2, BSA 2.36 square meters  . Obstructive sleep apnea    compliant with CPAP  . Persistent atrial fibrillation [facility])    Past Surgical History:  Past Surgical History:  Procedure Laterality Date  . AORTIC VALVE REPLACEMENT (AVR)/CORONARY ARTERY BYPASS GRAFTING (CABG)   08/05/2011   LIMA to LAD, sequential saphenous vein graft to third and fourth obtuse marginal branches of the circumflex, aortic valve replacement using a 23 mm Edwards pericardial valve  . APPENDECTOMY    . CARDIOVERSION N/A 11/14/2014   Procedure: CARDIOVERSION;  Surgeon: Donato Schultz, MD;  Location: [facility] ENDOSCOPY;  Service: Cardiovascular;  Laterality: N/A;  . CARDIOVERSION N/A 11/20/2016   Procedure: CARDIOVERSION;  Surgeon: Pricilla Riffle, MD;  Location: [facility] ENDOSCOPY;  Service: Cardiovascular;  Laterality: N/A;  . CAROTID ENDARTERECTOMY     Dr Lollie Sails  . EP IMPLANTABLE DEVICE N/A 11/06/2015   Procedure: PPM Generator Changeout;for  sick sinus syndrome with a MDT Adapta L PPM, chronically elevated RV threshold.  . permanent pacemaker     MDT EnRhythm implanted by Dr Lawanda Cousins in [place] for complete heart block with syncope  . REPLACEMENT TOTAL KNEE BILATERAL     2006  . TEE WITHOUT CARDIOVERSION N/A 11/14/2014   Procedure: TRANSESOPHAGEAL ECHOCARDIOGRAM (TEE);  Surgeon: Donato Schultz, MD;  Location: [facility] ENDOSCOPY;  Service: Cardiovascular;  Laterality: N/A;   HPI:  83 year old male with right-sided subdural hematoma with seizures. PMHx: Aortic stenosis, COPD, Hx of CAD, Obesity, Obstructive sleep apnea, Afib.    Assessment / Plan / Recommendation Clinical Impression  Pt has significant dysarthria and cognitive impairments. He is difficult to understand even at the single word and short phrase level. He is oriented to person and location only. HIs sustained attention, basic comprehension, confrontational naming, and processing speed are all impaired with Mod-Max cues given for task completion, although given inconsistent performance question what his true level of function is. He often laughs when presented with instructions or questions, which appears to be an attempt to cover his deficits. Pt will need intensive SLP f/u to maximize functional independence and communication.    SLP Assessment  Patient needs continued Speech Lanaguage Pathology Services    Follow Up Recommendations  Inpatient Rehab    Frequency and Duration min 2x/week  2 weeks      SLP Evaluation Cognition  Overall Cognitive Status: Impaired/Different from baseline Arousal/Alertness: Awake/alert Orientation Level: Oriented to person;Oriented to place;Disoriented to time;Disoriented to situation Attention: Sustained Sustained Attention: Impaired Sustained  Attention Impairment: Verbal basic;Functional basic Memory: Impaired Memory Impairment: Decreased recall of new information Awareness: Impaired Awareness Impairment: Intellectual  impairment;Emergent impairment;Anticipatory impairment Problem Solving: Impaired Problem Solving Impairment: Verbal basic Behaviors: Other (comment) (frequent laughing, appears to try to cover deficits) Safety/Judgment: Impaired       Comprehension  Auditory Comprehension Overall Auditory Comprehension: Impaired Yes/No Questions: Impaired Basic Biographical Questions: 26-50% accurate Basic Immediate Environment Questions: 25-49% accurate Complex Questions: 50-74% accurate Commands: Impaired One Step Basic Commands: 25-49% accurate Conversation: Simple Interfering Components: Attention;Processing speed EffectiveTechniques: Extra processing time;Repetition    Expression Expression Primary Mode of Expression: Verbal Verbal Expression Overall Verbal Expression: Impaired Naming: Impairment Confrontation: Impaired Non-Verbal Means of Communication: Not applicable   Oral / Motor  Oral Motor/Sensory Function Overall Oral Motor/Sensory Function:  (difficult to assess, limited command following) Motor Speech Overall Motor Speech: Impaired Respiration: Within functional limits Phonation: Normal Articulation: Impaired Level of Impairment: Word Intelligibility: Intelligibility reduced Word: 0-24% accurate Phrase: 0-24% accurate   GO                    Maxcine Hamaiewonsky, Jojo Pehl 12/08/2016, 11:06 AM  Maxcine HamLaura Paiewonsky, M.A. CCC-SLP [phone]

## 2016-12-08 NOTE — Progress Notes (Signed)
Patient ID: Alex DuhamelJohn R [name], male   DOB: 03-03-1931, 83 y.o.   MRN: [id number] Subjective: Patient remains letharic but arousable [id number] MAEx4  Objective: Vital signs in last 24 hours: Temp:  [97.8 F (36.6 C)-99.7 F (37.6 C)] 97.8 F (36.6 C) (02/12 0400) Pulse Rate:  [26-123] 92 (02/12 0630) Resp:  [19-30] 30 (02/12 0630) BP: (123-182)/(77-138) 150/105 (02/12 0630) SpO2:  [80 %-100 %] 95 % (02/12 0630) Weight:  [109.3 kg (240 lb 15.4 oz)] 109.3 kg (240 lb 15.4 oz) (02/12 0338)  Intake/Output from previous day: 02/11 0701 - 02/12 0700 In: 3745 [I.V.:1800; NG/GT:1725; IV Piggyback:220] Out: 1150 [Urine:1150] Intake/Output this shift: No intake/output data recorded.    Lab Results: Lab Results  Component Value Date   WBC 11.1 (H) 12/08/2016   HGB 11.5 (L) 12/08/2016   HCT 36.0 (L) 12/08/2016   MCV 104.7 (H) 12/08/2016   PLT 140 (L) 12/08/2016   Lab Results  Component Value Date   INR 1.65 12/08/2016   BMET Lab Results  Component Value Date   NA 146 (H) 12/08/2016   K 3.3 (L) 12/08/2016   CL 112 (H) 12/08/2016   CO2 25 12/08/2016   GLUCOSE 141 (H) 12/08/2016   BUN 20 12/08/2016   CREATININE 0.63 12/08/2016   CALCIUM 8.4 (L) 12/08/2016    Studies/Results: Dg Abd Portable 1v  Result Date: 12/06/2016 CLINICAL DATA:  Feeding tube placement EXAM: PORTABLE ABDOMEN - 1 VIEW COMPARISON:  08/10/2011 FINDINGS: Feeding tube is in place with the tip in the distal duodenum near the ligament of Treitz. IMPRESSION: Feeding tube tip in the distal duodenum. Electronically Signed   By: Charlett NoseKevin  Dover M.D.   On: 12/06/2016 10:18    Assessment/Plan: Continue current supportive mgmt   LOS: 5 days    Alex [name] 12/08/2016, 8:13 AM

## 2016-12-09 DIAGNOSIS — D539 Nutritional anemia, unspecified: Secondary | ICD-10-CM

## 2016-12-09 DIAGNOSIS — Z9981 Dependence on supplemental oxygen: Secondary | ICD-10-CM

## 2016-12-09 DIAGNOSIS — E870 Hyperosmolality and hypernatremia: Secondary | ICD-10-CM

## 2016-12-09 DIAGNOSIS — R739 Hyperglycemia, unspecified: Secondary | ICD-10-CM

## 2016-12-09 DIAGNOSIS — I4891 Unspecified atrial fibrillation: Secondary | ICD-10-CM

## 2016-12-09 DIAGNOSIS — I4821 Permanent atrial fibrillation: Secondary | ICD-10-CM

## 2016-12-09 DIAGNOSIS — G4733 Obstructive sleep apnea (adult) (pediatric): Secondary | ICD-10-CM

## 2016-12-09 DIAGNOSIS — R001 Bradycardia, unspecified: Secondary | ICD-10-CM

## 2016-12-09 DIAGNOSIS — R569 Unspecified convulsions: Secondary | ICD-10-CM

## 2016-12-09 DIAGNOSIS — Z7289 Other problems related to lifestyle: Secondary | ICD-10-CM

## 2016-12-09 DIAGNOSIS — E876 Hypokalemia: Secondary | ICD-10-CM

## 2016-12-09 DIAGNOSIS — Z789 Other specified health status: Secondary | ICD-10-CM

## 2016-12-09 DIAGNOSIS — D72829 Elevated white blood cell count, unspecified: Secondary | ICD-10-CM

## 2016-12-09 DIAGNOSIS — J449 Chronic obstructive pulmonary disease, unspecified: Secondary | ICD-10-CM

## 2016-12-09 DIAGNOSIS — D696 Thrombocytopenia, unspecified: Secondary | ICD-10-CM

## 2016-12-09 DIAGNOSIS — R410 Disorientation, unspecified: Secondary | ICD-10-CM

## 2016-12-09 DIAGNOSIS — F1010 Alcohol abuse, uncomplicated: Secondary | ICD-10-CM

## 2016-12-09 DIAGNOSIS — S065X0D Traumatic subdural hemorrhage without loss of consciousness, subsequent encounter: Secondary | ICD-10-CM

## 2016-12-09 DIAGNOSIS — R451 Restlessness and agitation: Secondary | ICD-10-CM

## 2016-12-09 DIAGNOSIS — Z6830 Body mass index (BMI) 30.0-30.9, adult: Secondary | ICD-10-CM

## 2016-12-09 DIAGNOSIS — R1310 Dysphagia, unspecified: Secondary | ICD-10-CM

## 2016-12-09 DIAGNOSIS — I25810 Atherosclerosis of coronary artery bypass graft(s) without angina pectoris: Secondary | ICD-10-CM

## 2016-12-09 DIAGNOSIS — E6609 Other obesity due to excess calories: Secondary | ICD-10-CM

## 2016-12-09 DIAGNOSIS — Z9989 Dependence on other enabling machines and devices: Secondary | ICD-10-CM

## 2016-12-09 LAB — URINALYSIS, ROUTINE W REFLEX MICROSCOPIC
Bilirubin Urine: NEGATIVE
GLUCOSE, UA: NEGATIVE mg/dL
Hgb urine dipstick: NEGATIVE
Ketones, ur: NEGATIVE mg/dL
Leukocytes, UA: NEGATIVE
NITRITE: NEGATIVE
Protein, ur: 100 mg/dL — AB
Specific Gravity, Urine: 1.023 (ref 1.005–1.030)
pH: 6 (ref 5.0–8.0)

## 2016-12-09 LAB — GLUCOSE, CAPILLARY
GLUCOSE-CAPILLARY: 111 mg/dL — AB (ref 65–99)
Glucose-Capillary: 128 mg/dL — ABNORMAL HIGH (ref 65–99)
Glucose-Capillary: 118 mg/dL — ABNORMAL HIGH (ref 65–99)
Glucose-Capillary: 156 mg/dL — ABNORMAL HIGH (ref 65–99)
Glucose-Capillary: 124 mg/dL — ABNORMAL HIGH (ref 65–99)
Glucose-Capillary: 113 mg/dL — ABNORMAL HIGH (ref 65–99)

## 2016-12-09 LAB — PROTIME-INR
INR: 1.58
PROTHROMBIN TIME: 19.1 s — AB (ref 11.4–15.2)

## 2016-12-09 MED ORDER — POLYETHYLENE GLYCOL 3350 17 G PO PACK
17.0000 g | PACK | Freq: Every day | ORAL | Status: DC
  Administered 2016-12-09 – 2016-12-11 (×3): 17 g
  Filled 2016-12-09 (×3): qty 1

## 2016-12-09 MED ORDER — FOLIC ACID 1 MG PO TABS
1.0000 mg | ORAL_TABLET | Freq: Every day | ORAL | Status: DC
  Administered 2016-12-09 – 2016-12-11 (×3): 1 mg
  Filled 2016-12-09 (×3): qty 1

## 2016-12-09 MED ORDER — VITAMIN B-1 100 MG PO TABS
100.0000 mg | ORAL_TABLET | Freq: Every day | ORAL | Status: DC
  Administered 2016-12-09 – 2016-12-11 (×3): 100 mg
  Filled 2016-12-09 (×3): qty 1

## 2016-12-09 NOTE — Consult Note (Addendum)
Physical Medicine and Rehabilitation Consult Reason for Consult: Traumatic SDH Referring Physician: Dr. Marikay Alar   HPI: Alex [name] is a 83 y.o. right handed male with history of morbid obesity, alcohol use, COPD with nighttime oxygen, obstructive sleep apnea with CPAP, atrial fibrillation/AVR/CABG/PPM maintained on aspirin and Coumadin. Per chart review and wife, patient lives with spouse independent prior to admission. One level home with 3 steps to entry. Presented 12/03/2016 after a recent fall while in the yard and struck his head without loss of consciousness. Develop dry heaving gradual onset of moderate squeezing chest pain shortness of breath. CT of the head showed left parietal scalp hematoma. Acute right hemispheric subdural hematoma with maximal thickness in the right posterior parietal region measuring 15 mm. Right to left shift of 7.5 mm. Troponin negative. INR of 4.14 that was reversed. Neurosurgery Dr. Marikay Alar advised conservative care. Hospital course confusion with delirium he had been started on Precedex. Soft restraints were added for patient safety. Reported grand mal seizure 12/04/2016 required intubation and currently maintained on Keppra. EEG showed occasional low amplitude epileptiform discharges over the right posterior temporal region 3 clinicoelectrographic seizures captured arising from the right hemisphere lasting 2-4 minutes. Patient remains off anticoagulation due to subdural hematoma. He is currently NPO with nasogastric tube feeds for nutritional support. Latest cranial CT scan 12/05/2016 reviewed, showing stable right subdural hematoma with stable mass effect. Per report, no new large territory infarct or parenchymal hemorrhage identified. Physical occupational therapy evaluations completed with recommendations of physical medicine rehabilitation consult.   Review of Systems  Unable to perform ROS: Acuity of condition   Past Medical History:    Diagnosis Date  . Aortic stenosis    s/p AVR by Dr Laneta Simmers  . COPD (chronic obstructive pulmonary disease) (HCC)   . History of coronary artery disease    status post stenting of the marginal circumflex in 12/2003 and again in 2009  . Hyperlipidemia   . Morbid obesity (HCC)    weight 243 pounds, BMI 31.2kg/m2, BSA 2.36 square meters  . Obstructive sleep apnea    compliant with CPAP  . Persistent atrial fibrillation [facility])    Past Surgical History:  Procedure Laterality Date  . AORTIC VALVE REPLACEMENT (AVR)/CORONARY ARTERY BYPASS GRAFTING (CABG)   08/05/2011   LIMA to LAD, sequential saphenous vein graft to third and fourth obtuse marginal branches of the circumflex, aortic valve replacement using a 23 mm Edwards pericardial valve  . APPENDECTOMY    . CARDIOVERSION N/A 11/14/2014   Procedure: CARDIOVERSION;  Surgeon: Donato Schultz, MD;  Location: [facility] ENDOSCOPY;  Service: Cardiovascular;  Laterality: N/A;  . CARDIOVERSION N/A 11/20/2016   Procedure: CARDIOVERSION;  Surgeon: Pricilla Riffle, MD;  Location: [facility] ENDOSCOPY;  Service: Cardiovascular;  Laterality: N/A;  . CAROTID ENDARTERECTOMY     Dr Lollie Sails  . EP IMPLANTABLE DEVICE N/A 11/06/2015   Procedure: PPM Generator Changeout;for sick sinus syndrome with a MDT Adapta L PPM, chronically elevated RV threshold.  . permanent pacemaker     MDT EnRhythm implanted by Dr Lawanda Cousins in [place] for complete heart block with syncope  . REPLACEMENT TOTAL KNEE BILATERAL     2006  . TEE WITHOUT CARDIOVERSION N/A 11/14/2014   Procedure: TRANSESOPHAGEAL ECHOCARDIOGRAM (TEE);  Surgeon: Donato Schultz, MD;  Location: [facility] ENDOSCOPY;  Service: Cardiovascular;  Laterality: N/A;   Family History  Problem Relation Age of Onset  . Alzheimer's disease Mother   . Tuberculosis Father   .  Other Father     Spinal Meningitis   Social History:  reports that he quit smoking about 13 years ago. His smoking use included Cigarettes. He started smoking about 43 years  ago. He has a 15.00 pack-year smoking history. He has never used smokeless tobacco. He reports that he drinks alcohol. He reports that he does not use drugs. Allergies: No Known Allergies Medications Prior to Admission  Medication Sig Dispense Refill  . ALPRAZolam (XANAX) 0.25 MG tablet Take 0.25 mg by mouth at bedtime.    Marland Kitchen amiodarone (PACERONE) 200 MG tablet Take 1 tablet (200 mg total) by mouth 2 (two) times daily. 60 tablet 1  . aspirin 81 MG tablet Take 81 mg by mouth daily.    Marland Kitchen atorvastatin (LIPITOR) 80 MG tablet TAKE 1 TABLET BY MOUTH DAILY 90 tablet 3  . B Complex Vitamins (VITAMIN B COMPLEX PO) Take 1 tablet by mouth daily.     Marland Kitchen escitalopram (LEXAPRO) 10 MG tablet Take 10 mg by mouth at bedtime.    . furosemide (LASIX) 20 MG tablet Take 1 tablet (20mg ) by mouth daily. May take extra tablet for weight gain >3lbs 45 tablet 6  . HYDROcodone-acetaminophen (NORCO/VICODIN) 5-325 MG tablet Take 1 tablet by mouth 2 (two) times daily as needed for moderate pain.     Marland Kitchen ibuprofen (ADVIL,MOTRIN) 200 MG tablet Take 400 mg by mouth daily as needed for moderate pain.    . Magnesium 250 MG TABS Take 1 tablet (250 mg total) by mouth daily.  0  . metoprolol succinate (TOPROL-XL) 25 MG 24 hr tablet Take 12.5 mg by mouth 2 (two) times daily.     . potassium chloride (K-DUR) 10 MEQ tablet Take 1 tablet ( ) by mouth daily. Take extra 1 tablet if taking extra dose of lasix 45 tablet 3  . warfarin (COUMADIN) 2.5 MG tablet Take as directed by coumadin clinic (Patient taking differently: Take 1.25-2.5 mg by mouth See admin instructions. Pt takes 1.25mg  on Thursday, takes 2.5mg  on all other days) 40 tablet 1  . zolpidem (AMBIEN) 10 MG tablet Take 5-10 mg by mouth at bedtime.       Home: Home Living Family/patient expects to be discharged to:: Private residence Living Arrangements: Spouse/significant other Available Help at Discharge: Family Type of Home: House Home Access: Stairs to enter Water quality scientist of Steps: 3 Entrance Stairs-Rails: Right Home Layout: One level Bathroom Shower/Tub: Walk-in shower  Lives With: Spouse  Functional History: Prior Function Level of Independence: Independent Comments: drives, active on property at home Functional Status:  Mobility: Bed Mobility Overal bed mobility: Needs Assistance Bed Mobility: Supine to Sit, Sit to Supine Rolling: Total assist, +2 for physical assistance Supine to sit: +2 for physical assistance, Max assist Sit to supine: +2 for physical assistance, Max assist General bed mobility comments: Pt required multiple cues and increased time for movement initiation; able to initiate trunk and extremity movement, requiring maxA x2 for sup<>sit Transfers Overall transfer level: Needs assistance Equipment used: None Transfers: Sit to/from Stand Sit to Stand: Max assist, +2 physical assistance General transfer comment: MaxA x2 for sit-to-stand with bilat UE support on back of chair; pads used for pelvic control, and L knee block required for buckling.       ADL: ADL Overall ADL's : Needs assistance/impaired Eating/Feeding: NPO Grooming Details (indicate cue type and reason): Able to wash face with RUE once wash cloth presented. Pt was asking about toothbrush when gave him a swab with mouthwash on  it it rubbed it on his lips despite telling him it was like a toothbrush General ADL Comments: Total assist for ADL at this time. Tolerated sitting EOB 10 minutes with VSS throughout; total assist provided for blocking knees and posterior push.  Cognition: Cognition Overall Cognitive Status: Impaired/Different from baseline Arousal/Alertness: Awake/alert Orientation Level: Oriented to person, Disoriented to place, Disoriented to time, Disoriented to situation Attention: Sustained Sustained Attention: Impaired Sustained Attention Impairment: Verbal basic, Functional basic Memory: Impaired Memory Impairment: Decreased recall of  new information Awareness: Impaired Awareness Impairment: Intellectual impairment, Emergent impairment, Anticipatory impairment Problem Solving: Impaired Problem Solving Impairment: Verbal basic Behaviors: Other (comment) (frequent laughing, appears to try to cover deficits) Safety/Judgment: Impaired Cognition Arousal/Alertness: Awake/alert Behavior During Therapy: WFL for tasks assessed/performed Overall Cognitive Status: Impaired/Different from baseline Area of Impairment: Following commands, Safety/judgement Following Commands: Follows one step commands inconsistently, Follows one step commands with increased time Safety/Judgement: Decreased awareness of safety, Decreased awareness of deficits General Comments: Pt continues to have improved arousal and able to follow some commands; most speech is still difficult to understand.  Difficult to assess due to: Level of arousal  Blood pressure 124/80, pulse (!) 122, temperature 97.5 F (36.4 C), temperature source Axillary, resp. rate 13, height 6\' 2"  (1.88 m), weight 112.3 kg (247 lb 9.2 oz), SpO2 94 %. Physical Exam  Vitals reviewed. Constitutional: He appears well-developed.  Obese  HENT:  Head: Normocephalic.  Right Ear: External ear normal.  Left Ear: External ear normal.  Nasogastric tube in place  Eyes: Right eye exhibits no discharge. Left eye exhibits no discharge.  Pupils sluggish to light  Neck: Normal range of motion. Neck supple. No thyromegaly present.  Cardiovascular:  Cardiac rate controlled  Respiratory: Effort normal.  Decreased breath sounds at the bases but clear to auscultation Receiving Neb  GI: Soft. Bowel sounds are normal. He exhibits no distension.  Musculoskeletal: He exhibits no edema or tenderness.  Neurological: He is alert.  Bilateral mittens in place. Unable to accurately assess MMT and sensation, but strength appears to be >/4/5 throughout DTRs appears to be symmetric  Skin: Skin is warm and  dry.  Psychiatric: His affect is labile. His speech is delayed. He is agitated and slowed. Cognition and memory are impaired. He expresses inappropriate judgment.    Results for orders placed or performed during the hospital encounter of 12/03/16 (from the past 24 hour(s))  Glucose, capillary     Status: Abnormal   Collection Time: 12/08/16  8:23 AM  Result Value Ref Range   Glucose-Capillary 154 (H) 65 - 99 mg/dL   Comment 1 Notify RN    Comment 2 Document in Chart   Glucose, capillary     Status: Abnormal   Collection Time: 12/08/16 11:30 AM  Result Value Ref Range   Glucose-Capillary 149 (H) 65 - 99 mg/dL   Comment 1 Notify RN    Comment 2 Document in Chart   Glucose, capillary     Status: Abnormal   Collection Time: 12/08/16  3:51 PM  Result Value Ref Range   Glucose-Capillary 140 (H) 65 - 99 mg/dL  Glucose, capillary     Status: Abnormal   Collection Time: 12/08/16  7:40 PM  Result Value Ref Range   Glucose-Capillary 142 (H) 65 - 99 mg/dL  Glucose, capillary     Status: Abnormal   Collection Time: 12/08/16 11:16 PM  Result Value Ref Range   Glucose-Capillary 130 (H) 65 - 99 mg/dL  Protime-INR  Status: Abnormal   Collection Time: 12/09/16  3:10 AM  Result Value Ref Range   Prothrombin Time 19.1 (H) 11.4 - 15.2 seconds   INR 1.58   Glucose, capillary     Status: Abnormal   Collection Time: 12/09/16  3:29 AM  Result Value Ref Range   Glucose-Capillary 128 (H) 65 - 99 mg/dL   No results found.  Assessment/Plan: Diagnosis: Traumatic SDH Labs and images independently reviewed.  Records reviewed and summated above.  [place] score:  >/IV  Speech to evaluate for Post traumatic amnesia and interval GOAT scores to assess progress.  NeuroPsych evaluation for behavorial assessment.  Provide environmental management by reducing the level of stimulation, tolerating restlessness when possible, protecting patient from harming self or others and reducing patient's  cognitive confusion.  Address behavioral concerns include providing structured environments and daily routines.  Cognitive therapy to direct modular abilities in order to maintain goals  including problem solving, self regulation/monitoring, self management, attention, and memory.  Fall precautions; pt at risk for second impact syndrome  Prevention of secondary injury: monitor for hypotension, hypoxia, seizures or signs of increased ICP  AED:   Consider Propranolol for agitation and storming  Avoid medications that could impair cognitive abilities, such as anticholinergics, antihistaminic, benzodiazapines, narcotics, etc when possible   1. Does the need for close, 24 hr/day medical supervision in concert with the patient's rehab needs make it unreasonable for this patient to be served in a less intensive setting? Yes  2. Co-Morbidities requiring supervision/potential complications: obesity (Body mass index is 31.79 kg/m., diet and exercise education, encourage weight loss to increase endurance and promote overall health), alcohol use (CIWA), COPD (cont supplemental O2 qHS, monitor RR and O2 sats with increased activity), obstructive sleep apnea (Cont CPAP, monitor for daytime somnolence), atrial fibrillation/AVR/CABG/PPM (cont meds, monitor with increased activity), confusion with delirium, seizure (cont meds), NPO (advance diet as tolerated), Bradycardia (monitor HR with increased physical activity), hyperglycemia (Monitor in accordance with exercise and adjust meds as necessary), agitation (wean IV meds), hypokalemia (continue to monitor and replete as necessary), mild hypernatremia (cont to monitor, treat if necessary), leukocytosis (cont to monitor for signs and symptoms of infection, further workup if indicated), macrocytic anemia (transfuse if necessary to ensure appropriate perfusion for increased activity tolerance), Thrombocytopenia (< 60,000/mm3 no resistive exercise) 3. Due to bladder  management, bowel management, safety, skin/wound care, disease management, medication administration, pain management and patient education, does the patient require 24 hr/day rehab nursing? Yes 4. Does the patient require coordinated care of a physician, rehab nurse, PT (1-2 hrs/day, 5 days/week), OT (1-2 hrs/day, 5 days/week) and SLP (1-2 hrs/day, 5 days/week) to address physical and functional deficits in the context of the above medical diagnosis(es)? Yes Addressing deficits in the following areas: balance, endurance, locomotion, strength, transferring, bowel/bladder control, bathing, dressing, feeding, grooming, toileting, cognition, speech, language, swallowing and psychosocial support 5. Can the patient actively participate in an intensive therapy program of at least 3 hrs of therapy per day at least 5 days per week? Potentially 6. The potential for patient to make measurable gains while on inpatient rehab is excellent 7. Anticipated functional outcomes upon discharge from inpatient rehab are min assist  with PT, min assist with OT, min assist with SLP. 8. Estimated rehab length of stay to reach the above functional goals is: 25-30 days. 9. Does the patient have adequate social supports and living environment to accommodate these discharge functional goals? Potentially 10. Anticipated D/C setting: SNF 11.  Anticipated post D/C treatments: SNF 12. Overall Rehab/Functional Prognosis: good  RECOMMENDATIONS: This patient's condition is appropriate for continued rehabilitative care in the following setting: Will consider CIR to decrease burden of care once medically stable as wife can only supply supervision at discharge.  Patient has agreed to participate in recommended program. Potentially Note that insurance prior authorization may be required for reimbursement for recommended care.  Comment: Rehab Admissions Coordinator to follow up.  Maryla MorrowAnkit Ayodele Hartsock, MD, [place] DomFAAPMR Charlton AmorANGIULLI,DANIEL J.,  PA-C 12/09/2016

## 2016-12-09 NOTE — Progress Notes (Signed)
Patient ID: Jetty DuhamelJohn R Snelgrove, male   DOB: 07/08/1931, 83 y.o.   MRN: [id number] Subjective: Patient remains confused and at times agitated, some speech, more alert at times, MAEx4  Objective: Vital signs in last 24 hours: Temp:  [97.5 F (36.4 C)-98.4 F (36.9 C)] 97.5 F (36.4 C) (02/13 0400) Pulse Rate:  [43-125] 65 (02/13 0700) Resp:  [13-33] 14 (02/13 0700) BP: (114-183)/(63-121) 118/89 (02/13 0700) SpO2:  [94 %-100 %] 97 % (02/13 0700) Weight:  [112.3 kg (247 lb 9.2 oz)] 112.3 kg (247 lb 9.2 oz) (02/13 0430)  Intake/Output from previous day: 02/12 0701 - 02/13 0700 In: 3560 [I.V.:1725; NG/GT:1725; IV Piggyback:110] Out: 1225 [Urine:1225] Intake/Output this shift: No intake/output data recorded.    Lab Results: Lab Results  Component Value Date   WBC 11.1 (H) 12/08/2016   HGB 11.5 (L) 12/08/2016   HCT 36.0 (L) 12/08/2016   MCV 104.7 (H) 12/08/2016   PLT 140 (L) 12/08/2016   Lab Results  Component Value Date   INR 1.58 12/09/2016   BMET Lab Results  Component Value Date   NA 146 (H) 12/08/2016   K 3.3 (L) 12/08/2016   CL 112 (H) 12/08/2016   CO2 25 12/08/2016   GLUCOSE 141 (H) 12/08/2016   BUN 20 12/08/2016   CREATININE 0.63 12/08/2016   CALCIUM 8.4 (L) 12/08/2016    Studies/Results: No results found.  Assessment/Plan: Maybe slightly improved over last 24 hrs, PT/OT U/A AFIB - cannot anticoagulate obviously, rate ok  CT tomorrow  LOS: 6 days    JONES,DAVID S 12/09/2016, 7:24 AM

## 2016-12-09 NOTE — Progress Notes (Signed)
RRT held NIV for the night. Pt is all over the bed and seems confused and unable to follow commands. Aspiration risk.... Furthermore patient has a NG tube placement. RN aware

## 2016-12-09 NOTE — Progress Notes (Signed)
I met with pt's wife at bedside to discuss pt's options for rehab venue. She prefers an inpt rehab admission and can provide 24/7 supervision level at d/c. She is aware that pt likely to need min assist at d/c and she would like to see how he progresses and she also has a long term care policy that can assist either in a facility or for caregivers in the home. I will follow his progress and initiate insurance authorization when it seems he is close to d/c. [phone]

## 2016-12-10 ENCOUNTER — Inpatient Hospital Stay (HOSPITAL_COMMUNITY): Payer: Medicare Other

## 2016-12-10 LAB — GLUCOSE, CAPILLARY
GLUCOSE-CAPILLARY: 125 mg/dL — AB (ref 65–99)
Glucose-Capillary: 104 mg/dL — ABNORMAL HIGH (ref 65–99)
Glucose-Capillary: 132 mg/dL — ABNORMAL HIGH (ref 65–99)
Glucose-Capillary: 135 mg/dL — ABNORMAL HIGH (ref 65–99)
Glucose-Capillary: 149 mg/dL — ABNORMAL HIGH (ref 65–99)
Glucose-Capillary: 90 mg/dL (ref 65–99)

## 2016-12-10 MED ORDER — IPRATROPIUM-ALBUTEROL 0.5-2.5 (3) MG/3ML IN SOLN
3.0000 mL | RESPIRATORY_TRACT | Status: DC | PRN
  Administered 2016-12-11: 3 mL via RESPIRATORY_TRACT
  Filled 2016-12-10: qty 3
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
## 2016-12-10 NOTE — Progress Notes (Signed)
Speech Language Pathology Treatment: Cognitive-Linquistic  Patient Details Name: Alex DuhamelJohn R [name] MRN: [id number] DOB: 11/11/1930 Today's Date: 12/10/2016 Time: [id number] SLP Time Calculation (min) (ACUTE ONLY): 20 min  Assessment / Plan / Recommendation Clinical Impression  Pt is more alert and participatory this session, and makes jokes in more appropriate context. He needed Mod cues for sustained attention to PO trials, and Mod cues for following one-step commands, although this was faded to Min cues during more familiar, functional tasks. His speech is more intelligible at the sentence level, although he still has a Mild-Moderate dysarthria. Continue to recommend CIR level therapy upon d/c.    HPI HPI: 83 year old male with right-sided subdural hematoma with seizures. PMHx: Aortic stenosis, COPD, Hx of CAD, Obesity, Obstructive sleep apnea, Afib.       SLP Plan  Continue with current plan of care     Recommendations  Medication Administration: Via alternative means                Oral Care Recommendations: Oral care QID Follow up Recommendations: Inpatient Rehab Plan: Continue with current plan of care       GO                Maxcine Hamaiewonsky, Hendry Speas 12/10/2016, 4:22 PM  Maxcine HamLaura Paiewonsky, M.A. CCC-SLP [phone]

## 2016-12-10 NOTE — Evaluation (Signed)
Clinical/Bedside Swallow Evaluation Patient Details  Name: Alex DuhamelJohn R Furnas MRN: [id number] Date of Birth: [birth year]  Today's Date: 12/10/2016 Time: SLP Start Time (ACUTE ONLY): 1511 SLP Stop Time (ACUTE ONLY): 1521 SLP Time Calculation (min) (ACUTE ONLY): 10 min  Past Medical History:  Past Medical History:  Diagnosis Date  . Aortic stenosis    s/p AVR by Dr Laneta SimmersBartle  . COPD (chronic obstructive pulmonary disease) (HCC)   . History of coronary artery disease    status post stenting of the marginal circumflex in 12/2003 and again in 2009  . Hyperlipidemia   . Morbid obesity (HCC)    weight 243 pounds, BMI 31.2kg/m2, BSA 2.36 square meters  . Obstructive sleep apnea    compliant with CPAP  . Persistent atrial fibrillation [facility](HCC)    Past Surgical History:  Past Surgical History:  Procedure Laterality Date  . AORTIC VALVE REPLACEMENT (AVR)/CORONARY ARTERY BYPASS GRAFTING (CABG)   08/05/2011   LIMA to LAD, sequential saphenous vein graft to third and fourth obtuse marginal branches of the circumflex, aortic valve replacement using a 23 mm Edwards pericardial valve  . APPENDECTOMY    . CARDIOVERSION N/A 11/14/2014   Procedure: CARDIOVERSION;  Surgeon: Donato SchultzMark Skains, MD;  [facility];  Service: Cardiovascular;  Laterality: N/A;  . CARDIOVERSION N/A 11/20/2016   Procedure: CARDIOVERSION;  Surgeon: Pricilla RifflePaula Ross V, MD;  [facility];  Service: Cardiovascular;  Laterality: N/A;  . CAROTID ENDARTERECTOMY     Dr Lollie Sailsale Williams  . EP IMPLANTABLE DEVICE N/A 11/06/2015   Procedure: PPM Generator Changeout;for sick sinus syndrome with a MDT Adapta L PPM, chronically elevated RV threshold.  . permanent pacemaker     MDT EnRhythm implanted by Dr Lawanda CousinsAl-Kori in [place] for complete heart block with syncope  . REPLACEMENT TOTAL KNEE BILATERAL     2006  . TEE WITHOUT CARDIOVERSION N/A 11/14/2014   Procedure: TRANSESOPHAGEAL ECHOCARDIOGRAM (TEE);  Surgeon: Donato SchultzMark Skains, MD;  [facility];  Service: Cardiovascular;  Laterality: N/A;   HPI:  83 year old male with right-sided subdural hematoma with seizures. PMHx: Aortic stenosis, COPD, Hx of CAD, Obesity, Obstructive sleep apnea, Afib.    Assessment / Plan / Recommendation Clinical Impression  Pt had moderate amounts of dried secretions adhered to the back of his tongue, which was removed prior to PO trials. Brief oral holding is noted. He had an occasional, delayed throat clear, but overall with minimal overt s/s of aspiration. He does however have deep inhalations post-swallow, which is concerning for his ability to coordinate breath/swallow appropriately. Recommend to proceed with MBS prior to initiating PO diet.    Aspiration Risk  Moderate aspiration risk    Diet Recommendation NPO   Medication Administration: Via alternative means    Other  Recommendations Oral Care Recommendations: Oral care QID   Follow up Recommendations        Frequency and Duration            Prognosis Prognosis for Safe Diet Advancement: Good      Swallow Study   General HPI: 83 year old male with right-sided subdural hematoma with seizures. PMHx: Aortic stenosis, COPD, Hx of CAD, Obesity, Obstructive sleep apnea, Afib.  Type of Study: Bedside Swallow Evaluation Previous Swallow Assessment: none in chart Diet Prior to this Study: NPO Temperature Spikes Noted: No Respiratory Status: Nasal cannula History of Recent Intubation: No Behavior/Cognition: Alert;Cooperative;Requires cueing Oral Cavity Assessment: Dry;Dried secretions Oral Care Completed by SLP: Yes Oral Cavity - Dentition:  (  partials?) Self-Feeding Abilities: Needs assist Patient Positioning: Upright in chair Baseline Vocal Quality: Normal Volitional Swallow: Able to elicit    Oral/Motor/Sensory Function Overall Oral Motor/Sensory Function: Generalized oral weakness   Ice Chips Ice chips: Within functional limits Presentation: Spoon   Thin Liquid Thin  Liquid: Impaired Presentation: Cup;Spoon Oral Phase Functional Implications: Oral holding Pharyngeal  Phase Impairments: Throat Clearing - Delayed;Change in Vital Signs    Nectar Thick Nectar Thick Liquid: Not tested   Honey Thick Honey Thick Liquid: Not tested   Puree Puree: Impaired Presentation: Spoon Oral Phase Impairments: Poor awareness of bolus Oral Phase Functional Implications: Oral holding Pharyngeal Phase Impairments: Change in Vital Signs   Solid   GO   Solid: Not tested        Maxcine Ham 12/10/2016,4:20 PM  Maxcine Ham, M.A. CCC-SLP [phone]

## 2016-12-10 NOTE — PMR Pre-admission (Signed)
PMR Admission Coordinator Pre-Admission Assessment  Patient: Alex [name] is an 83 y.o., male MRN: [id number] DOB: [birth year] Height: 6\' 2"  (188 cm) Weight: 115.5 kg (254 lb 11.2 oz)              Insurance Information HMO: yes    PPO:      PCP:      IPA:      80/20:      OTHER: medicare advantage plan PRIMARY: [employer] Medicare      Policy#: [id number]      Subscriber: pt CM Name: Jonny RuizJohn      Phone#: [phone]     Fax#: [phone] Pre-Cert#: [id number] approved for 7 days with follow up CM Velvet BatheEmily Davis phone [phone] Fax: EPIC access      Employer: retired Benefits:  Phone #: [phone]     Name: 12/09/16 Eff. Date: 10/27/16     Deduct: none      Out of Pocket Max: $4400      Life Max: none CIR: $345 co pay per day days 1-5 then covers 100%      SNF: no co pay days 1-20: $160 co pay per day days 21-48: no co pay days 49-100 Outpatient: $40 co pay per visit     Co-Pay: visits per medical neccesity Home Health: 100%      Co-Pay: visits per medical neccesity DME: 80%     Co-Pay: 20% Providers: in network  SECONDARY: none        Medicaid Application Date:       Case Manager:  Disability Application Date:       Case Worker:   Emergency Contact Information Contact Information    Name Relation Home Work Mobile   [place] Spouse [phone]  [phone]   Bobetta LimeReaves,Kerry Son   [phone]     Current Medical History  Patient Admitting Diagnosis: Traumatic SDH  History of Present Illness: HPI: Alex BlanchJohn R Reavesis a 83 y.o.right handed malewith history of morbid obesity, alcohol use, COPD with nighttime oxygen, obstructive sleep apnea with CPAP, atrial fibrillation/AVR/CABG/PPM maintained on aspirin and Coumadin. Presented 12/03/2016 after a recent fall and struck his head without loss of consciousness. Develop dry heaving gradual onset of moderate squeezing chest pain shortness of breath. CT of the head showed left parietal scalp hematoma. Acute right hemispheric subdural  hematoma with maximal thickness in the right posterior parietal region measuring 15 mm. Right to left shift of 7.5 mm. Troponin negative. INR of 4.14 that was reversed. Neurosurgery Dr. Marikay Alaravid Jones advised conservative care. Hospital course confusion with delirium he had been started on Precedex. Soft restraints were added for patient safety. Reported grand mal seizure 12/04/2016 required intubation and currently maintained on Keppra. EEG showed occasional low amplitude epileptiform discharges over the right posterior temporal region 3 clinicoelectrographic seizures captured arising from the right hemisphere lasting 2-4 minutes. Patient remains off anticoagulation due to subdural hematoma. MBS 12/11/16 with pt now on full liquid diet with meds crushed in puree. No plans to replace cortrak at this time. Latest cranial CT scan 12/10/2016 reviewed, showingstable right subdural hematoma with stable mass effect. Per report, no new large territory infarct or parenchymal hemorrhage identified.  Past Medical History  Past Medical History:  Diagnosis Date  . Aortic stenosis    s/p AVR by Dr Laneta SimmersBartle  . COPD (chronic obstructive pulmonary disease) (HCC)   . History of coronary artery disease    status post stenting of the marginal circumflex in 12/2003 and again in  2009  . Hyperlipidemia   . Morbid obesity (HCC)    weight 243 pounds, BMI 31.2kg/m2, BSA 2.36 square meters  . Obstructive sleep apnea    compliant with CPAP  . Persistent atrial fibrillation (HCC)     Family History  family history includes Alzheimer's disease in his mother; Other in his father; Tuberculosis in his father.  Prior Rehab/Hospitalizations:  Has the patient had major surgery during 100 days prior to admission? No  Current Medications   Current Facility-Administered Medications:  .  0.9 %  sodium chloride infusion, , Intravenous, Continuous, Tia Alert, MD, Last Rate: 75 mL/hr at 12/11/16 0357 .  acetaminophen (TYLENOL)  tablet 650 mg, 650 mg, Oral, Q4H PRN, 650 mg at 12/03/16 1826 **OR** acetaminophen (TYLENOL) solution 650 mg, 650 mg, Per Tube, Q4H PRN **OR** acetaminophen (TYLENOL) suppository 650 mg, 650 mg, Rectal, Q4H PRN, Tia Alert, MD, 650 mg at 12/05/16 1216 .  acetaminophen-codeine (TYLENOL #3) 300-30 MG per tablet 1-2 tablet, 1-2 tablet, Oral, Q4H PRN, Tia Alert, MD, 2 tablet at 12/10/16 1605 .  amiodarone (PACERONE) tablet 200 mg, 200 mg, Oral, BID, Tia Alert, MD, 200 mg at 12/10/16 9528 .  chlorhexidine (PERIDEX) 0.12 % solution 15 mL, 15 mL, Mouth Rinse, BID, Tia Alert, MD, 15 mL at 12/10/16 2200 .  feeding supplement (JEVITY 1.2 CAL) liquid 1,000 mL, 1,000 mL, Per Tube, Continuous, Jose Alexis Frock, MD, Last Rate: 65 mL/hr at 12/10/16 0538, 1,000 mL at 12/10/16 0538 .  feeding supplement (PRO-STAT SUGAR FREE 64) liquid 30 mL, 30 mL, Per Tube, TID, Jose Alexis Frock, MD, 30 mL at 12/10/16 1604 .  folic acid (FOLVITE) tablet 1 mg, 1 mg, Per Tube, Daily, Tia Alert, MD, 1 mg at 12/10/16 [phone] .  insulin aspart (novoLOG) injection 0-15 Units, 0-15 Units, Subcutaneous, Q4H, Rahul P Desai, PA-C, 2 Units at 12/10/16 1707 .  ipratropium-albuterol (DUONEB) 0.5-2.5 (3) MG/3ML nebulizer solution 3 mL, 3 mL, Nebulization, Q2H PRN, Tia Alert, MD, 3 mL at 12/11/16 0421 .  labetalol (NORMODYNE,TRANDATE) injection 10 mg, 10 mg, Intravenous, Q2H PRN, Tia Alert, MD, 10 mg at 12/08/16 1952 .  levETIRAcetam (KEPPRA) 1,000 mg in sodium chloride 0.9 % 100 mL IVPB, 1,000 mg, Intravenous, Q12H, Cyril Mourning V, MD, 1,000 mg at 12/11/16 0357 .  LORazepam (ATIVAN) injection 1 mg, 1 mg, Intravenous, Q2H PRN, Cyril Mourning V, MD, 1 mg at 12/10/16 2200 .  MEDLINE mouth rinse, 15 mL, Mouth Rinse, q12n4p, Tia Alert, MD, 15 mL at 12/10/16 1610 .  metoprolol tartrate (LOPRESSOR) 25 mg/10 mL oral suspension 12.5 mg, 12.5 mg, Per Tube, BID, Cyril Mourning V, MD, 12.5 mg at 12/10/16 0938 .  pantoprazole  (PROTONIX) injection 40 mg, 40 mg, Intravenous, QHS, Tia Alert, MD, 40 mg at 12/09/16 2145 .  polyethylene glycol (MIRALAX / GLYCOLAX) packet 17 g, 17 g, Per Tube, Daily, Tia Alert, MD, 17 g at 12/10/16 [phone] .  potassium chloride 20 MEQ/15ML (10%) solution 10 mEq, 10 mEq, Oral, Daily, Tia Alert, MD .  senna-docusate (Senokot-S) tablet 1 tablet, 1 tablet, Oral, BID, Tia Alert, MD, 1 tablet at 12/10/16 [phone] .  thiamine (VITAMIN B-1) tablet 100 mg, 100 mg, Per Tube, Daily, Tia Alert, MD, 100 mg at 12/10/16 5366  Patients Current Diet: Diet full liquid Room service appropriate? Yes; Fluid consistency: Thin Diet - low sodium heart healthy . Pt removed cortrak 2/15,  no plans to replace at this time due to MBS results today 12/11/16  Precautions / Restrictions Precautions Precautions: Fall Restrictions Weight Bearing Restrictions: No   Has the patient had 2 or more falls or a fall with injury in the past year?Yes. Pt with numerous falls over past 6 months per wife. Pt will not use AD due to pride and makes him look old. He has balance issues and ETOH abuse per wife.  Prior Activity Level Community (5-7x/wk): independent and driving pta. balance issues and refused AD  Home Assistive Devices / Equipment Home Assistive Devices/Equipment: None  Prior Device Use: Indicate devices/aids used by the patient prior to current illness, exacerbation or injury? None of the above  Prior Functional Level Prior Function Level of Independence: Independent Comments: drives, active on property at home  Self Care: Did the patient need help bathing, dressing, using the toilet or eating?  Independent  Indoor Mobility: Did the patient need assistance with walking from room to room (with or without device)? Independent  Stairs: Did the patient need assistance with internal or external stairs (with or without device)? Independent  Functional Cognition: Did the patient need help planning  regular tasks such as shopping or remembering to take medications? Independent  Current Functional Level Cognition  Arousal/Alertness: Awake/alert Overall Cognitive Status: Impaired/Different from baseline Difficult to assess due to: Level of arousal Current Attention Level: Selective Orientation Level: Oriented to person, Oriented to place, Oriented to time Following Commands: Follows one step commands consistently Safety/Judgement: Decreased awareness of safety General Comments: Pt joking appropriately.  repeatedly asking for water.  He is slow to initiate activity  Attention: Sustained Sustained Attention: Impaired Sustained Attention Impairment: Verbal basic, Functional basic Memory: Impaired Memory Impairment: Decreased recall of new information Awareness: Impaired Awareness Impairment: Intellectual impairment, Emergent impairment, Anticipatory impairment Problem Solving: Impaired Problem Solving Impairment: Verbal basic Behaviors: Other (comment) (frequent laughing, appears to try to cover deficits) Safety/Judgment: Impaired    Extremity Assessment (includes Sensation/Coordination)  Upper Extremity Assessment: LUE deficits/detail LUE Deficits / Details: Pt noted to have functional AROM in LUE, does not purposefully move to command. Increased edema noted. LUE Sensation: decreased light touch LUE Coordination: decreased fine motor, decreased gross motor  Lower Extremity Assessment: Defer to PT evaluation RLE Deficits / Details: increased tone noted when attempting to move RLE toward EOB, question tone vs patient extending against therapist activity LLE Deficits / Details: unable to assess    ADLs  Overall ADL's : Needs assistance/impaired Eating/Feeding: NPO Grooming: Wash/dry hands, Wash/dry face, Min guard, Sitting Grooming Details (indicate cue type and reason): Able to wash face with RUE once wash cloth presented. Pt was asking about toothbrush when gave him a swab with  mouthwash on it it rubbed it on his lips despite telling him it was like a toothbrush Toilet Transfer: Minimal assistance, +2 for physical assistance, BSC, Squat-pivot Toilet Transfer Details (indicate cue type and reason): assist to shift weight  Toileting- Clothing Manipulation and Hygiene: Maximal assistance, Sit to/from stand Functional mobility during ADLs: Moderate assistance, +2 for physical assistance General ADL Comments: Total assist for ADL at this time. Tolerated sitting EOB 10 minutes with VSS throughout; total assist provided for blocking knees and posterior push.    Mobility  Overal bed mobility: Needs Assistance Bed Mobility: Supine to Sit Rolling: Total assist, +2 for physical assistance Supine to sit: Min assist, HOB elevated Sit to supine: +2 for physical assistance, Max assist General bed mobility comments: Pt required assist to lift trunk.  cues to scoot hips to EOB     Transfers  Overall transfer level: Needs assistance Equipment used: 2 person hand held assist Transfers: Sit to/from Stand, [employer] Transfers Sit to Stand: Mod assist, +2 physical assistance Squat pivot transfers: Min assist, +2 physical assistance General transfer comment: Pt requires mod A +2 to boost into standing.   He performed squat pivot transfer to recliner requiring cues for hand placement and assist to shift hips/weight.     Ambulation / Gait / Stairs / Engineer, drilling / Balance Dynamic Sitting Balance Sitting balance - Comments: Able to sit EOB without UE support. Balance Overall balance assessment: Needs assistance Sitting-balance support: Feet supported, No upper extremity supported Sitting balance-[name]: Fair Sitting balance - Comments: Able to sit EOB without UE support. Postural control: Posterior lean Standing balance support: Bilateral upper extremity supported, During functional activity Standing balance-[name]: Poor Standing balance comment:  Pt able to stand with min guard assist with bil. UE support. Pt with posterior lean and sway at times requiring Min A. Stood for ~5 mins for pericare and to change sheets. Marching in standing with BUE supported. No knee buckling.    Special needs/care consideration BiPAP/CPAP did not use pta. Only used in ED this admission CPM  N/a Continuous Drip IV  N/a Dialysis  N/a Life Vest  N/a Oxygen  Uses O2 at 2 liters nasal cannula at HS pta Special Bed  N/a Trach Size  N/a Wound Vac (area)  N/a Skin ecchymosis BUE; skin tear left arm Bowel mgmt: incontinent LBM 2/14 Bladder mgmt: catheter Diabetic mgmt  N/a Pt is HOH per wife Mitts used with cortrak usage Decreased safety awareness ; needs bed and chair alarm   Previous Home Environment Living Arrangements: Spouse/significant other  Lives With: Spouse Available Help at Discharge: Family, Available 24 hours/day Type of Home: House Home Layout: One level Home Access: Stairs to enter Entrance Stairs-Rails: Right Entrance Stairs-Number of Steps: 3 Bathroom Shower/Tub: Health visitor: Standard Bathroom Accessibility: Yes How Accessible: Accessible via walker Home Care Services: No Additional Comments: wife can provide supervision level  Discharge Living Setting Plans for Discharge Living Setting: Patient's home, Lives with (comment) (wife) Type of Home at Discharge: House Discharge Home Layout: One level Discharge Home Access: Stairs to enter Entrance Stairs-Rails: Right Entrance Stairs-Number of Steps: 3 Discharge Bathroom Shower/Tub: Walk-in shower Discharge Bathroom Toilet: Standard Discharge Bathroom Accessibility: Yes How Accessible: Accessible via walker Does the patient have any problems obtaining your medications?: No  Social/Family/Support Systems Patient Roles: Spouse, Parent Contact Information: [place] Facundo, wife Anticipated Caregiver: wife and hired Child psychotherapist  Information: see above Ability/Limitations of Caregiver: wife can provide supervision level, would hire assist if needed Caregiver Availability: 24/7 Discharge Plan Discussed with Primary Caregiver: Yes Is Caregiver In Agreement with Plan?: Yes Does Caregiver/Family have Issues with Lodging/Transportation while Pt is in Rehab?: No  Goals/Additional Needs Patient/Family Goal for Rehab: supervision to min assist with PT, OT, and SLP Expected length of stay: ELOS 20 -25 days Additional Information: Pt with history of alcohol abuse per wife Pt/Family Agrees to Admission and willing to participate: Yes Program Orientation Provided & Reviewed with Pt/Caregiver Including Roles  & Responsibilities: Yes  Decrease burden of Care through IP rehab admission: n/a  Possible need for SNF placement upon discharge: if patient does not reach supervision level or light min assist that wife can not manage at home, SNF is an option  per wife. Pt has a long term care policy that pays for care in the home as well as at SNF. Son discussing SNF option with his Mom after CIR per wife.  Patient Condition: This patient's medical and functional status has changed since the consult dated 12/08/2016 in which the Rehabilitation Physician determined and documented that the patient was potentially appropriate for intensive rehabilitative care in an inpatient rehabilitation facility. Issues have been addressed and update has been discussed with Dr. Riley Kill and patient now appropriate for inpatient rehabilitation. Pt has progressed functionally well on Acute hospital and wife would like to see if he could make progress to SNF level at thi=ome rather than SNF.Will admit to inpatient rehab today.   Preadmission Screen Completed By:  Clois Dupes, 12/11/2016 10:49 AM ______________________________________________________________________   Discussed status with Dr. Riley Kill on 12/11/2016 at  1056  and received telephone approval  for admission today.  Admission Coordinator:  Clois Dupes, time 1610 Date 12/11/2016

## 2016-12-10 NOTE — Progress Notes (Signed)
I will begin insurance authorization for a possible inpt rehab admission. [id number]

## 2016-12-10 NOTE — Progress Notes (Signed)
Physical Therapy Treatment Patient Details Name: Alex [name] MRN: [name] DOB: 1931-04-28 Today's Date: 12/10/2016    History of Present Illness 83 year old male with right-sided subdural hematoma with seizures. PMHx: Aortic stenosis, COPD, Hx of CAD, Obesity, Obstructive sleep apnea, Afib.     PT Comments    Patient progressing well towards PT goals. Appropriately joking during session and in good spirits. Able to sit unsupported EOB today. Tolerated longer standing bouts with marching requiring UE support. Pt eager to drink some water. Tolerated squat pivot transfer to chair with min A of 2. Great CIR candidate. Will continue to follow and progress as tolerated.  Follow Up Recommendations  CIR     Equipment Recommendations  None recommended by PT    Recommendations for Other Services       Precautions / Restrictions Precautions Precautions: Fall Restrictions Weight Bearing Restrictions: No    Mobility  Bed Mobility Overal bed mobility: Needs Assistance Bed Mobility: Supine to Sit     Supine to sit: Min assist;HOB elevated     General bed mobility comments: Pt required assist to lift trunk.  cues to scoot hips to EOB   Transfers Overall transfer level: Needs assistance Equipment used: 2 person hand held assist Transfers: Sit to/from [employer] CorporationStand;Squat Pivot Transfers Sit to Stand: Mod assist;+2 physical assistance   Squat pivot transfers: Min assist;+2 physical assistance     General transfer comment: Pt requires mod A +2 to boost into standing.   He performed squat pivot transfer to recliner requiring cues for hand placement and assist to shift hips/weight.   Ambulation/Gait                 Stairs            Wheelchair Mobility    Modified Rankin (Stroke Patients Only) Modified Rankin (Stroke Patients Only) Pre-Morbid Rankin Score: No symptoms Modified Rankin: Moderately severe disability     Balance Overall balance assessment: Needs  assistance Sitting-balance support: Feet supported;No upper extremity supported Sitting balance-[name]: Fair Sitting balance - Comments: Able to sit EOB without UE support.   Standing balance support: Bilateral upper extremity supported;During functional activity Standing balance-[name]: Poor Standing balance comment: Pt able to stand with min guard assist with bil. UE support. Pt with posterior lean and sway at times requiring Min A. Stood for ~5 mins for pericare and to change sheets. Marching in standing with BUE supported. No knee buckling.                    Cognition Arousal/Alertness: Awake/alert Behavior During Therapy: WFL for tasks assessed/performed Overall Cognitive Status: Impaired/Different from baseline Area of Impairment: Attention;Following commands;Safety/judgement;Problem solving;Memory   Current Attention Level: Selective Memory: Decreased recall of precautions Following Commands: Follows one step commands consistently Safety/Judgement: Decreased awareness of safety   Problem Solving: Slow processing;Decreased initiation;Requires verbal cues;Requires tactile cues General Comments: Pt joking appropriately.  repeatedly asking for water.  He is slow to initiate activity     Exercises      General Comments General comments (skin integrity, edema, etc.): Wife present.      Pertinent Vitals/Pain Pain Assessment: No/denies pain    Home Living                      Prior Function            PT Goals (current goals can now be found in the care plan section) Progress towards PT goals:  Progressing toward goals    Frequency    Min 3X/week      PT Plan Current plan remains appropriate    Co-evaluation PT/OT/SLP Co-Evaluation/Treatment: Yes Reason for Co-Treatment: Complexity of the patient's impairments (multi-system involvement);To address functional/ADL transfers PT goals addressed during session: Mobility/safety with  mobility;Balance OT goals addressed during session: ADL's and self-care     End of Session Equipment Utilized During Treatment: Gait belt Activity Tolerance: Patient tolerated treatment well Patient left: in chair;with call bell/phone within reach;with family/visitor present;with chair alarm set     Time: 9604-5409 PT Time Calculation (min) (ACUTE ONLY): 46 min  Charges:  $Therapeutic Activity: 23-37 mins                    G Codes:      Ariz Terrones A Isaiah Torok 12/10/2016, 3:36 PM Mylo Red, PT, DPT [phone]

## 2016-12-10 NOTE — Progress Notes (Signed)
Patient ID: Alex DuhamelJohn R [name], male   DOB: 1931/07/13, 83 y.o.   MRN: [id number] Doing much better, much more awake and appropriate, maex4, gaze conjugate. Pleased with progress, CT head stable. Stable for dc to CIR when bed approved.

## 2016-12-10 NOTE — Progress Notes (Signed)
Occupational Therapy Treatment Patient Details Name: Alex DuhamelJohn R [name] MRN: [id number] DOB: February 04, 1931 Today's Date: 12/10/2016    History of present illness 83 year old male with right-sided subdural hematoma with seizures. PMHx: Aortic stenosis, COPD, Hx of CAD, Obesity, Obstructive sleep apnea, Afib.    OT comments  Pt is making excellent progress thus far.  He requires min A for bed mobility and mod A +2 for standing, but min guard for standing balance in prep for ADLs. He requires mod A overall for ADLs.  He joked appropriately, but continues with impaired cognition.  Wife is very supportive.    Follow Up Recommendations  CIR;Supervision/Assistance - 24 hour    Equipment Recommendations  None recommended by OT    Recommendations for Other Services Rehab consult    Precautions / Restrictions Precautions Precautions: Fall       Mobility Bed Mobility Overal bed mobility: Needs Assistance Bed Mobility: Supine to Sit     Supine to sit: Min assist     General bed mobility comments: Pt required assist to lift trunk.  cues to scoot hips to EOB   Transfers Overall transfer level: Needs assistance Equipment used: 2 person hand held assist Transfers: Sit to/from [employer]Stand;Squat Pivot Transfers Sit to Stand: Mod assist;+2 physical assistance   Squat pivot transfers: Min assist;+2 physical assistance     General transfer comment: Pt requires mod A +2 to boost into standing.   He performed squat pivot transfer to recliner requiring cues for hand placement and assist to shift hips/weight     Balance Overall balance assessment: Needs assistance Sitting-balance support: Feet supported Sitting balance-[name]: Fair     Standing balance support: Bilateral upper extremity supported Standing balance-[name]: Poor Standing balance comment: Pt able to stand with min guard assist with bil. UE support                    ADL Overall ADL's : Needs  assistance/impaired Eating/Feeding: NPO   Grooming: Wash/dry hands;Wash/dry face;Min Freight forwarderguard;Sitting                   Toilet Transfer: Minimal assistance;+2 for physical assistance;BSC;Squat-pivot StatisticianToilet Transfer Details (indicate cue type and reason): assist to shift weight  Toileting- Clothing Manipulation and Hygiene: Maximal assistance;Sit to/from stand       Functional mobility during ADLs: Moderate assistance;+2 for physical assistance        Vision                 Additional Comments: to be further assessed    Perception     Praxis      Cognition   Behavior During Therapy: [facility] SystemWFL for tasks assessed/performed Overall Cognitive Status: Impaired/Different from baseline Area of Impairment: Attention;Following commands;Safety/judgement;Problem solving;Memory   Current Attention Level: Selective (with cues ) Memory: Decreased recall of precautions  Following Commands: Follows one step commands consistently Safety/Judgement: Decreased awareness of safety   Problem Solving: Slow processing;Decreased initiation;Requires verbal cues;Requires tactile cues General Comments: Pt joking appropriately.  repeatedly asking for water.  He is slow to initiate activity     Extremity/Trunk Assessment               Exercises     Shoulder Instructions       General Comments      Pertinent Vitals/ Pain       Pain Assessment: No/denies pain  Home Living  Prior Functioning/Environment              Frequency  Min 3X/week        Progress Toward Goals  OT Goals(current goals can now be found in the care plan section)  Progress towards OT goals: Progressing toward goals     Plan Discharge plan remains appropriate    Co-evaluation    PT/OT/SLP Co-Evaluation/Treatment: Yes Reason for Co-Treatment: Complexity of the patient's impairments (multi-system involvement)   OT goals addressed  during session: ADL's and self-care      End of Session Equipment Utilized During Treatment: Gait belt   Activity Tolerance Patient tolerated treatment well   Patient Left in chair;with call bell/phone within reach;with chair alarm set;with family/visitor present   Nurse Communication Mobility status        Time: 1610-9604 OT Time Calculation (min): 46 min  Charges: OT General Charges $OT Visit: 1 Procedure OT Treatments $Therapeutic Activity: 8-22 mins  Noelene Gang M 12/10/2016, 3:28 PM

## 2016-12-10 NOTE — Progress Notes (Signed)
Paged RT about patient's BIPAP, but was told patient has not been on it since from ED.

## 2016-12-11 ENCOUNTER — Inpatient Hospital Stay (HOSPITAL_COMMUNITY): Payer: Medicare Other

## 2016-12-11 ENCOUNTER — Encounter (HOSPITAL_COMMUNITY): Payer: Self-pay | Admitting: *Deleted

## 2016-12-11 ENCOUNTER — Inpatient Hospital Stay (HOSPITAL_COMMUNITY)
Admission: RE | Admit: 2016-12-11 | Discharge: 2016-12-15 | DRG: 949 | Disposition: A | Discharge status: Other Acute Inpatient Hospital | Payer: Medicare Other | Source: Intra-hospital | Attending: Physical Medicine & Rehabilitation | Admitting: Physical Medicine & Rehabilitation

## 2016-12-11 DIAGNOSIS — Z6831 Body mass index (BMI) 31.0-31.9, adult: Secondary | ICD-10-CM

## 2016-12-11 DIAGNOSIS — G4733 Obstructive sleep apnea (adult) (pediatric): Secondary | ICD-10-CM | POA: Diagnosis not present

## 2016-12-11 DIAGNOSIS — J810 Acute pulmonary edema: Secondary | ICD-10-CM | POA: Diagnosis not present

## 2016-12-11 DIAGNOSIS — S0689AA Other specified intracranial injury with loss of consciousness status unknown, initial encounter: Secondary | ICD-10-CM | POA: Diagnosis present

## 2016-12-11 DIAGNOSIS — Z7982 Long term (current) use of aspirin: Secondary | ICD-10-CM | POA: Diagnosis not present

## 2016-12-11 DIAGNOSIS — I4892 Unspecified atrial flutter: Secondary | ICD-10-CM | POA: Diagnosis present

## 2016-12-11 DIAGNOSIS — Z950 Presence of cardiac pacemaker: Secondary | ICD-10-CM | POA: Diagnosis not present

## 2016-12-11 DIAGNOSIS — Z955 Presence of coronary angioplasty implant and graft: Secondary | ICD-10-CM

## 2016-12-11 DIAGNOSIS — W1830XD Fall on same level, unspecified, subsequent encounter: Secondary | ICD-10-CM

## 2016-12-11 DIAGNOSIS — J449 Chronic obstructive pulmonary disease, unspecified: Secondary | ICD-10-CM | POA: Diagnosis not present

## 2016-12-11 DIAGNOSIS — I5032 Chronic diastolic (congestive) heart failure: Secondary | ICD-10-CM | POA: Diagnosis not present

## 2016-12-11 DIAGNOSIS — I480 Paroxysmal atrial fibrillation: Secondary | ICD-10-CM | POA: Diagnosis not present

## 2016-12-11 DIAGNOSIS — I484 Atypical atrial flutter: Secondary | ICD-10-CM

## 2016-12-11 DIAGNOSIS — R0603 Acute respiratory distress: Secondary | ICD-10-CM

## 2016-12-11 DIAGNOSIS — E785 Hyperlipidemia, unspecified: Secondary | ICD-10-CM | POA: Diagnosis not present

## 2016-12-11 DIAGNOSIS — D72829 Elevated white blood cell count, unspecified: Secondary | ICD-10-CM

## 2016-12-11 DIAGNOSIS — G40409 Other generalized epilepsy and epileptic syndromes, not intractable, without status epilepticus: Secondary | ICD-10-CM | POA: Diagnosis not present

## 2016-12-11 DIAGNOSIS — Z96653 Presence of artificial knee joint, bilateral: Secondary | ICD-10-CM | POA: Diagnosis not present

## 2016-12-11 DIAGNOSIS — S065X3A Traumatic subdural hemorrhage with loss of consciousness of 1 hour to 5 hours 59 minutes, initial encounter: Secondary | ICD-10-CM | POA: Diagnosis present

## 2016-12-11 DIAGNOSIS — E876 Hypokalemia: Secondary | ICD-10-CM | POA: Diagnosis not present

## 2016-12-11 DIAGNOSIS — S065X0D Traumatic subdural hemorrhage without loss of consciousness, subsequent encounter: Secondary | ICD-10-CM | POA: Diagnosis present

## 2016-12-11 DIAGNOSIS — R5383 Other fatigue: Secondary | ICD-10-CM

## 2016-12-11 DIAGNOSIS — K5900 Constipation, unspecified: Secondary | ICD-10-CM

## 2016-12-11 DIAGNOSIS — R1312 Dysphagia, oropharyngeal phase: Secondary | ICD-10-CM

## 2016-12-11 DIAGNOSIS — I110 Hypertensive heart disease with heart failure: Secondary | ICD-10-CM

## 2016-12-11 DIAGNOSIS — S065X3S Traumatic subdural hemorrhage with loss of consciousness of 1 hour to 5 hours 59 minutes, sequela: Secondary | ICD-10-CM | POA: Diagnosis not present

## 2016-12-11 DIAGNOSIS — Z79899 Other long term (current) drug therapy: Secondary | ICD-10-CM

## 2016-12-11 DIAGNOSIS — R1310 Dysphagia, unspecified: Secondary | ICD-10-CM

## 2016-12-11 DIAGNOSIS — I5033 Acute on chronic diastolic (congestive) heart failure: Secondary | ICD-10-CM | POA: Diagnosis not present

## 2016-12-11 DIAGNOSIS — Z953 Presence of xenogenic heart valve: Secondary | ICD-10-CM | POA: Diagnosis not present

## 2016-12-11 DIAGNOSIS — I2510 Atherosclerotic heart disease of native coronary artery without angina pectoris: Secondary | ICD-10-CM

## 2016-12-11 DIAGNOSIS — I25810 Atherosclerosis of coronary artery bypass graft(s) without angina pectoris: Secondary | ICD-10-CM | POA: Diagnosis not present

## 2016-12-11 DIAGNOSIS — I5031 Acute diastolic (congestive) heart failure: Secondary | ICD-10-CM | POA: Diagnosis not present

## 2016-12-11 DIAGNOSIS — Z87891 Personal history of nicotine dependence: Secondary | ICD-10-CM

## 2016-12-11 DIAGNOSIS — S06369A Traumatic hemorrhage of cerebrum, unspecified, with loss of consciousness of unspecified duration, initial encounter: Secondary | ICD-10-CM

## 2016-12-11 DIAGNOSIS — F1010 Alcohol abuse, uncomplicated: Secondary | ICD-10-CM | POA: Diagnosis not present

## 2016-12-11 DIAGNOSIS — F0390 Unspecified dementia without behavioral disturbance: Secondary | ICD-10-CM

## 2016-12-11 DIAGNOSIS — Z951 Presence of aortocoronary bypass graft: Secondary | ICD-10-CM

## 2016-12-11 DIAGNOSIS — J42 Unspecified chronic bronchitis: Secondary | ICD-10-CM | POA: Diagnosis not present

## 2016-12-11 LAB — GLUCOSE, CAPILLARY
GLUCOSE-CAPILLARY: 108 mg/dL — AB (ref 65–99)
GLUCOSE-CAPILLARY: 102 mg/dL — AB (ref 65–99)
Glucose-Capillary: 114 mg/dL — ABNORMAL HIGH (ref 65–99)
Glucose-Capillary: 115 mg/dL — ABNORMAL HIGH (ref 65–99)

## 2016-12-11 MED ORDER — LORAZEPAM 2 MG/ML IJ SOLN
1.0000 mg | INTRAMUSCULAR | Status: DC | PRN
  Administered 2016-12-11 – 2016-12-12 (×4): 1 mg via INTRAVENOUS
  Filled 2016-12-11 (×4): qty 1

## 2016-12-11 MED ORDER — LEVETIRACETAM 500 MG PO TABS
1000.0000 mg | ORAL_TABLET | Freq: Two times a day (BID) | ORAL | Status: DC
  Filled 2016-12-11: qty 2

## 2016-12-11 MED ORDER — POTASSIUM CHLORIDE 20 MEQ/15ML (10%) PO SOLN
10.0000 meq | Freq: Every day | ORAL | Status: DC
  Administered 2016-12-12: 10 meq via ORAL
  Filled 2016-12-11: qty 15

## 2016-12-11 MED ORDER — IPRATROPIUM-ALBUTEROL 0.5-2.5 (3) MG/3ML IN SOLN
3.0000 mL | RESPIRATORY_TRACT | Status: DC | PRN
  Administered 2016-12-11 – 2016-12-12 (×2): 3 mL via RESPIRATORY_TRACT
  Filled 2016-12-11 (×2): qty 3

## 2016-12-11 MED ORDER — FOLIC ACID 1 MG PO TABS
1.0000 mg | ORAL_TABLET | Freq: Every day | ORAL | Status: DC
  Administered 2016-12-12 – 2016-12-15 (×4): 1 mg via ORAL
  Filled 2016-12-11 (×4): qty 1

## 2016-12-11 MED ORDER — VITAMIN B-1 100 MG PO TABS
100.0000 mg | ORAL_TABLET | Freq: Every day | ORAL | Status: DC
  Administered 2016-12-12 – 2016-12-15 (×4): 100 mg via ORAL
  Filled 2016-12-11 (×4): qty 1

## 2016-12-11 MED ORDER — PANTOPRAZOLE SODIUM 40 MG PO TBEC: 40.0000 mg | DELAYED_RELEASE_TABLET | Freq: Every day | ORAL | Status: DC

## 2016-12-11 MED ORDER — ONDANSETRON HCL 4 MG PO TABS: 4.0000 mg | ORAL_TABLET | Freq: Four times a day (QID) | ORAL | Status: DC | PRN

## 2016-12-11 MED ORDER — ACETAMINOPHEN 160 MG/5ML PO SOLN
650.0000 mg | ORAL | Status: DC | PRN
  Administered 2016-12-13: 650 mg
  Filled 2016-12-11 (×2): qty 20.3

## 2016-12-11 MED ORDER — AMIODARONE HCL 200 MG PO TABS
200.0000 mg | ORAL_TABLET | Freq: Two times a day (BID) | ORAL | Status: DC
  Administered 2016-12-12: 200 mg via ORAL
  Filled 2016-12-11 (×2): qty 1

## 2016-12-11 MED ORDER — METOPROLOL TARTRATE 25 MG/10 ML ORAL SUSPENSION
12.5000 mg | Freq: Two times a day (BID) | ORAL | Status: DC
  Administered 2016-12-11 – 2016-12-12 (×2): 12.5 mg via ORAL
  Filled 2016-12-11 (×2): qty 10

## 2016-12-11 MED ORDER — ONDANSETRON HCL 4 MG/2ML IJ SOLN: 4.0000 mg | Freq: Four times a day (QID) | INTRAMUSCULAR | Status: DC | PRN

## 2016-12-11 MED ORDER — ACETAMINOPHEN 325 MG PO TABS
650.0000 mg | ORAL_TABLET | ORAL | Status: DC | PRN
  Administered 2016-12-14: 650 mg via ORAL
  Filled 2016-12-11: qty 2

## 2016-12-11 MED ORDER — ACETAMINOPHEN 650 MG RE SUPP
650.0000 mg | RECTAL | Status: DC | PRN
  Administered 2016-12-12: 650 mg via RECTAL
  Filled 2016-12-11: qty 1

## 2016-12-11 MED ORDER — SORBITOL 70 % SOLN
30.0000 mL | Freq: Every day | Status: DC | PRN
Start: 2016-12-11 — End: 2016-12-15
  Filled 2016-12-11: qty 30

## 2016-12-11 MED ORDER — SENNOSIDES-DOCUSATE SODIUM 8.6-50 MG PO TABS
1.0000 | ORAL_TABLET | Freq: Two times a day (BID) | ORAL | Status: DC
  Administered 2016-12-12: 1 via ORAL
  Filled 2016-12-11 (×2): qty 1

## 2016-12-11 MED ORDER — POTASSIUM CHLORIDE 20 MEQ/15ML (10%) PO SOLN
10.0000 meq | Freq: Every day | ORAL | Status: DC
  Administered 2016-12-11: 10 meq via ORAL
  Filled 2016-12-11: qty 15

## 2016-12-11 MED ORDER — LEVETIRACETAM 100 MG/ML PO SOLN
1000.0000 mg | Freq: Two times a day (BID) | ORAL | Status: DC
  Administered 2016-12-12 – 2016-12-15 (×8): 1000 mg via ORAL
  Filled 2016-12-11 (×8): qty 10

## 2016-12-11 MED ORDER — PANTOPRAZOLE SODIUM 40 MG PO PACK
40.0000 mg | PACK | Freq: Every day | ORAL | Status: DC
  Administered 2016-12-12 – 2016-12-15 (×4): 40 mg via ORAL
  Filled 2016-12-11 (×3): qty 20

## 2016-12-11 MED ORDER — POLYETHYLENE GLYCOL 3350 17 G PO PACK
17.0000 g | PACK | Freq: Every day | ORAL | Status: DC
Start: 2016-12-12 — End: 2016-12-15
  Administered 2016-12-12 – 2016-12-15 (×4): 17 g via ORAL
  Filled 2016-12-11 (×4): qty 1

## 2016-12-11 NOTE — Progress Notes (Signed)
2120-2215--late entry BP taken manually 170/98, AP pulse 92/min,  RR 22, pulse ox 100% on 2L [place],  lungs with scattered rhonchi, raised HOB to 90 degrees, pt denies being hungry, noted his tray had just arrived,  started to administer medications,  metoprolol po 5ml given, pt swallows without difficulty, crushed and mixed keppra in vanilla pudding.  Did not administer keppra or other meds as pt began to increase RR to 28, then up to 32/min. Increased SOB, O2 sat remains 100 on 2L [place], expriatory wheeze noted,pt with increased restlessness,ativan administered,  call to [place] [facility] [place], to notify of change in resp status, state to given prn neb tx, prn neb tx given, RR decreased to 24/min, lungs remain with scattered rhonchi, no wheezing. Pt with strong cough, small amount of light whitish sputum, tinged brown, foamy, suctioned with yankauer.  Pt denies sob, will continue to monitor, respiratory therapy notified to come and assess need for bipap.

## 2016-12-11 NOTE — Care Management Note (Signed)
Case Management Note  Patient Details  Name: Alex DuhamelJohn R [name] MRN: [id number] Date of Birth: [birth year]  Subjective/Objective:                    Action/Plan: Pt discharging to CIR today. No further needs per CM.   Expected Discharge Date:  12/11/16               Expected Discharge Plan:  IP Rehab Facility  In-House Referral:     Discharge planning Services  CM Consult  Post Acute Care Choice:    Choice offered to:     DME Arranged:    DME Agency:     HH Arranged:    HH Agency:     Status of Service:  Completed, signed off  If discussed at [employer] Length of [employer] Meetings, dates discussed:    Additional Comments:  Kermit BaloKelli F Ramiz Turpin, RN 12/11/2016, 12:09 PM

## 2016-12-11 NOTE — Progress Notes (Signed)
Ankit Karis JubaAnil Patel, MD Physician Addendum Physical Medicine and Rehabilitation  Consult Note Date of Service: 12/09/2016 5:53 AM  Related encounter: ED to Hosp-Admission (Current) [facility]O [facility]ICAL     Expand All Collapse All   [] Hide copied text [] Hover for attribution information      Physical Medicine and Rehabilitation Consult Reason for Consult: Traumatic SDH Referring Physician: Dr. Marikay Alaravid Jones   HPI: Alex [name] is a 83 y.o. right handed male with history of morbid obesity, alcohol use, COPD with nighttime oxygen, obstructive sleep apnea with CPAP, atrial fibrillation/AVR/CABG/PPM maintained on aspirin and Coumadin. Per chart review and wife, patient lives with spouse independent prior to admission. One level home with 3 steps to entry. Presented 12/03/2016 after a recent fall while in the yard and struck his head without loss of consciousness. Develop dry heaving gradual onset of moderate squeezing chest pain shortness of breath. CT of the head showed left parietal scalp hematoma. Acute right hemispheric subdural hematoma with maximal thickness in the right posterior parietal region measuring 15 mm. Right to left shift of 7.5 mm. Troponin negative. INR of 4.14 that was reversed. Neurosurgery Dr. Marikay Alaravid Jones advised conservative care. Hospital course confusion with delirium he had been started on Precedex. Soft restraints were added for patient safety. Reported grand mal seizure 12/04/2016 required intubation and currently maintained on Keppra. EEG showed occasional low amplitude epileptiform discharges over the right posterior temporal region 3 clinicoelectrographic seizures captured arising from the right hemisphere lasting 2-4 minutes. Patient remains off anticoagulation due to subdural hematoma. He is currently NPO with nasogastric tube feeds for nutritional support. Latest cranial CT scan 12/05/2016 reviewed, showing stable right subdural  hematoma with stable mass effect. Per report, no new large territory infarct or parenchymal hemorrhage identified. Physical occupational therapy evaluations completed with recommendations of physical medicine rehabilitation consult.   Review of Systems  Unable to perform ROS: Acuity of condition       Past Medical History:  Diagnosis Date  . Aortic stenosis    s/p AVR by Dr Laneta SimmersBartle  . COPD (chronic obstructive pulmonary disease) (HCC)   . History of coronary artery disease    status post stenting of the marginal circumflex in 12/2003 and again in 2009  . Hyperlipidemia   . Morbid obesity (HCC)    weight 243 pounds, BMI 31.2kg/m2, BSA 2.36 square meters  . Obstructive sleep apnea    compliant with CPAP  . Persistent atrial fibrillation [facility](HCC)         Past Surgical History:  Procedure Laterality Date  . AORTIC VALVE REPLACEMENT (AVR)/CORONARY ARTERY BYPASS GRAFTING (CABG)   08/05/2011   LIMA to LAD, sequential saphenous vein graft to third and fourth obtuse marginal branches of the circumflex, aortic valve replacement using a 23 mm Edwards pericardial valve  . APPENDECTOMY    . CARDIOVERSION N/A 11/14/2014   Procedure: CARDIOVERSION;  Surgeon: Donato SchultzMark Skains, MD;  [facility];  Service: Cardiovascular;  Laterality: N/A;  . CARDIOVERSION N/A 11/20/2016   Procedure: CARDIOVERSION;  Surgeon: Pricilla RifflePaula Ross V, MD;  [facility];  Service: Cardiovascular;  Laterality: N/A;  . CAROTID ENDARTERECTOMY     Dr Lollie Sailsale Williams  . EP IMPLANTABLE DEVICE N/A 11/06/2015   Procedure: PPM Generator Changeout;for sick sinus syndrome with a MDT Adapta L PPM, chronically elevated RV threshold.  . permanent pacemaker     MDT EnRhythm implanted by Dr Lawanda CousinsAl-Kori in [place] for complete heart block with syncope  .  REPLACEMENT TOTAL KNEE BILATERAL     2006  . TEE WITHOUT CARDIOVERSION N/A 11/14/2014   Procedure: TRANSESOPHAGEAL ECHOCARDIOGRAM (TEE);  Surgeon: Donato Schultz, MD;  Location[facility];  Service: Cardiovascular;  Laterality: N/A;         Family History  Problem Relation Age of Onset  . Alzheimer's disease Mother   . Tuberculosis Father   . Other Father     Spinal Meningitis   Social History:  reports that he quit smoking about 13 years ago. His smoking use included Cigarettes. He started smoking about 43 years ago. He has a 15.00 pack-year smoking history. He has never used smokeless tobacco. He reports that he drinks alcohol. He reports that he does not use drugs. Allergies: No Known Allergies       Medications Prior to Admission  Medication Sig Dispense Refill  . ALPRAZolam (XANAX) 0.25 MG tablet Take 0.25 mg by mouth at bedtime.    Marland Kitchen amiodarone (PACERONE) 200 MG tablet Take 1 tablet (200 mg total) by mouth 2 (two) times daily. 60 tablet 1  . aspirin 81 MG tablet Take 81 mg by mouth daily.    Marland Kitchen atorvastatin (LIPITOR) 80 MG tablet TAKE 1 TABLET BY MOUTH DAILY 90 tablet 3  . B Complex Vitamins (VITAMIN B COMPLEX PO) Take 1 tablet by mouth daily.     Marland Kitchen escitalopram (LEXAPRO) 10 MG tablet Take 10 mg by mouth at bedtime.    . furosemide (LASIX) 20 MG tablet Take 1 tablet (20mg ) by mouth daily. May take extra tablet for weight gain >3lbs 45 tablet 6  . HYDROcodone-acetaminophen (NORCO/VICODIN) 5-325 MG tablet Take 1 tablet by mouth 2 (two) times daily as needed for moderate pain.     Marland Kitchen ibuprofen (ADVIL,MOTRIN) 200 MG tablet Take 400 mg by mouth daily as needed for moderate pain.    . Magnesium 250 MG TABS Take 1 tablet (250 mg total) by mouth daily.  0  . metoprolol succinate (TOPROL-XL) 25 MG 24 hr tablet Take 12.5 mg by mouth 2 (two) times daily.     . potassium chloride (K-DUR) 10 MEQ tablet Take 1 tablet ( ) by mouth daily. Take extra 1 tablet if taking extra dose of lasix 45 tablet 3  . warfarin (COUMADIN) 2.5 MG tablet Take as directed by coumadin clinic (Patient taking differently: Take 1.25-2.5 mg by mouth  See admin instructions. Pt takes 1.25mg  on Thursday, takes 2.5mg  on all other days) 40 tablet 1  . zolpidem (AMBIEN) 10 MG tablet Take 5-10 mg by mouth at bedtime.       Home: Home Living Family/patient expects to be discharged to:: Private residence Living Arrangements: Spouse/significant other Available Help at Discharge: Family Type of Home: House Home Access: Stairs to enter Secretary/administrator of Steps: 3 Entrance Stairs-Rails: Right Home Layout: One level Bathroom Shower/Tub: Walk-in shower  Lives With: Spouse  Functional History: Prior Function Level of Independence: Independent Comments: drives, active on property at home Functional Status:  Mobility: Bed Mobility Overal bed mobility: Needs Assistance Bed Mobility: Supine to Sit, Sit to Supine Rolling: Total assist, +2 for physical assistance Supine to sit: +2 for physical assistance, Max assist Sit to supine: +2 for physical assistance, Max assist General bed mobility comments: Pt required multiple cues and increased time for movement initiation; able to initiate trunk and extremity movement, requiring maxA x2 for sup<>sit Transfers Overall transfer level: Needs assistance Equipment used: None Transfers: Sit to/from Stand Sit to Stand: Max assist, +2 physical assistance  General transfer comment: MaxA x2 for sit-to-stand with bilat UE support on back of chair; pads used for pelvic control, and L knee block required for buckling.   ADL: ADL Overall ADL's : Needs assistance/impaired Eating/Feeding: NPO Grooming Details (indicate cue type and reason): Able to wash face with RUE once wash cloth presented. Pt was asking about toothbrush when gave him a swab with mouthwash on it it rubbed it on his lips despite telling him it was like a toothbrush General ADL Comments: Total assist for ADL at this time. Tolerated sitting EOB 10 minutes with VSS throughout; total assist provided for blocking knees and posterior  push.  Cognition: Cognition Overall Cognitive Status: Impaired/Different from baseline Arousal/Alertness: Awake/alert Orientation Level: Oriented to person, Disoriented to place, Disoriented to time, Disoriented to situation Attention: Sustained Sustained Attention: Impaired Sustained Attention Impairment: Verbal basic, Functional basic Memory: Impaired Memory Impairment: Decreased recall of new information Awareness: Impaired Awareness Impairment: Intellectual impairment, Emergent impairment, Anticipatory impairment Problem Solving: Impaired Problem Solving Impairment: Verbal basic Behaviors: Other (comment) (frequent laughing, appears to try to cover deficits) Safety/Judgment: Impaired Cognition Arousal/Alertness: Awake/alert Behavior During Therapy: WFL for tasks assessed/performed Overall Cognitive Status: Impaired/Different from baseline Area of Impairment: Following commands, Safety/judgement Following Commands: Follows one step commands inconsistently, Follows one step commands with increased time Safety/Judgement: Decreased awareness of safety, Decreased awareness of deficits General Comments: Pt continues to have improved arousal and able to follow some commands; most speech is still difficult to understand.  Difficult to assess due to: Level of arousal  Blood pressure 124/80, pulse (!) 122, temperature 97.5 F (36.4 C), temperature source Axillary, resp. rate 13, height 6\' 2"  (1.88 m), weight 112.3 kg (247 lb 9.2 oz), SpO2 94 %. Physical Exam  Vitals reviewed. Constitutional: He appears well-developed.  Obese  HENT:  Head: Normocephalic.  Right Ear: External ear normal.  Left Ear: External ear normal.  Nasogastric tube in place  Eyes: Right eye exhibits no discharge. Left eye exhibits no discharge.  Pupils sluggish to light  Neck: Normal range of motion. Neck supple. No thyromegaly present.  Cardiovascular:  Cardiac rate controlled  Respiratory: Effort normal.   Decreased breath sounds at the bases but clear to auscultation Receiving Neb  GI: Soft. Bowel sounds are normal. He exhibits no distension.  Musculoskeletal: He exhibits no edema or tenderness.  Neurological: He is alert.  Bilateral mittens in place. Unable to accurately assess MMT and sensation, but strength appears to be >/4/5 throughout DTRs appears to be symmetric  Skin: Skin is warm and dry.  Psychiatric: His affect is labile. His speech is delayed. He is agitated and slowed. Cognition and memory are impaired. He expresses inappropriate judgment.    Lab Results Last 24 Hours       Results for orders placed or performed during the hospital encounter of 12/03/16 (from the past 24 hour(s))  Glucose, capillary     Status: Abnormal   Collection Time: 12/08/16  8:23 AM  Result Value Ref Range   Glucose-Capillary 154 (H) 65 - 99 mg/dL   Comment 1 Notify RN    Comment 2 Document in Chart   Glucose, capillary     Status: Abnormal   Collection Time: 12/08/16 11:30 AM  Result Value Ref Range   Glucose-Capillary 149 (H) 65 - 99 mg/dL   Comment 1 Notify RN    Comment 2 Document in Chart   Glucose, capillary     Status: Abnormal   Collection Time: 12/08/16  3:51 PM  Result Value Ref Range   Glucose-Capillary 140 (H) 65 - 99 mg/dL  Glucose, capillary     Status: Abnormal   Collection Time: 12/08/16  7:40 PM  Result Value Ref Range   Glucose-Capillary 142 (H) 65 - 99 mg/dL  Glucose, capillary     Status: Abnormal   Collection Time: 12/08/16 11:16 PM  Result Value Ref Range   Glucose-Capillary 130 (H) 65 - 99 mg/dL  Protime-INR     Status: Abnormal   Collection Time: 12/09/16  3:10 AM  Result Value Ref Range   Prothrombin Time 19.1 (H) 11.4 - 15.2 seconds   INR 1.58   Glucose, capillary     Status: Abnormal   Collection Time: 12/09/16  3:29 AM  Result Value Ref Range   Glucose-Capillary 128 (H) 65 - 99 mg/dL     Imaging Results (Last 48 hours)  No  results found.    Assessment/Plan: Diagnosis: Traumatic SDH Labs and images independently reviewed.  Records reviewed and summated above.             [place] score:  >/IV             Speech to evaluate for Post traumatic amnesia and interval GOAT scores to assess progress.             NeuroPsych evaluation for behavorial assessment.             Provide environmental management by reducing the level of stimulation, tolerating restlessness when possible, protecting patient from harming self or others and reducing patient's cognitive confusion.             Address behavioral concerns include providing structured environments and daily routines.             Cognitive therapy to direct modular abilities in order to maintain goals        including problem solving, self regulation/monitoring, self management, attention, and memory.             Fall precautions; pt at risk for second impact syndrome             Prevention of secondary injury: monitor for hypotension, hypoxia, seizures or signs of increased ICP             AED:              Consider Propranolol for agitation and storming             Avoid medications that could impair cognitive abilities, such as anticholinergics, antihistaminic, benzodiazapines, narcotics, etc when possible   1. Does the need for close, 24 hr/day medical supervision in concert with the patient's rehab needs make it unreasonable for this patient to be served in a less intensive setting? Yes  2. Co-Morbidities requiring supervision/potential complications: obesity (Body mass index is 31.79 kg/m., diet and exercise education, encourage weight loss to increase endurance and promote overall health), alcohol use (CIWA), COPD (cont supplemental O2 qHS, monitor RR and O2 sats with increased activity), obstructive sleep apnea (Cont CPAP, monitor for daytime somnolence), atrial fibrillation/AVR/CABG/PPM (cont meds, monitor with increased activity), confusion with  delirium, seizure (cont meds), NPO (advance diet as tolerated), Bradycardia (monitor HR with increased physical activity), hyperglycemia (Monitor in accordance with exercise and adjust meds as necessary), agitation (wean IV meds), hypokalemia (continue to monitor and replete as necessary), mild hypernatremia (cont to monitor, treat if necessary), leukocytosis (cont to monitor for signs and symptoms of infection, further workup if indicated), macrocytic anemia (  transfuse if necessary to ensure appropriate perfusion for increased activity tolerance), Thrombocytopenia (< 60,000/mm3 no resistive exercise) 3. Due to bladder management, bowel management, safety, skin/wound care, disease management, medication administration, pain management and patient education, does the patient require 24 hr/day rehab nursing? Yes 4. Does the patient require coordinated care of a physician, rehab nurse, PT (1-2 hrs/day, 5 days/week), OT (1-2 hrs/day, 5 days/week) and SLP (1-2 hrs/day, 5 days/week) to address physical and functional deficits in the context of the above medical diagnosis(es)? Yes Addressing deficits in the following areas: balance, endurance, locomotion, strength, transferring, bowel/bladder control, bathing, dressing, feeding, grooming, toileting, cognition, speech, language, swallowing and psychosocial support 5. Can the patient actively participate in an intensive therapy program of at least 3 hrs of therapy per day at least 5 days per week? Potentially 6. The potential for patient to make measurable gains while on inpatient rehab is excellent 7. Anticipated functional outcomes upon discharge from inpatient rehab are min assist  with PT, min assist with OT, min assist with SLP. 8. Estimated rehab length of stay to reach the above functional goals is: 25-30 days. 9. Does the patient have adequate social supports and living environment to accommodate these discharge functional goals? Potentially 10. Anticipated  D/C setting: SNF 11. Anticipated post D/C treatments: SNF 12. Overall Rehab/Functional Prognosis: good  RECOMMENDATIONS: This patient's condition is appropriate for continued rehabilitative care in the following setting: Will consider CIR to decrease burden of care once medically stable as wife can only supply supervision at discharge.  Patient has agreed to participate in recommended program. Potentially Note that insurance prior authorization may be required for reimbursement for recommended care.  Comment: Rehab Admissions Coordinator to follow up.  Maryla Morrow, MD, [place] Dom Charlton Amor., PA-C 12/09/2016    Revision History                             Routing History

## 2016-12-11 NOTE — Progress Notes (Signed)
I have insurance approval to admit pt to inpt rehab today. Wife at bedside and in agreement. Dr. Yetta BarreJones in to see pt and also in agreement. Plans are to not reinsert cortrak at this time due to MBS results this morning. RN CM made aware. I will make the arrangements to admit today. [phone]

## 2016-12-11 NOTE — Progress Notes (Signed)
2200-2300-- Summary of care, orientee Malon KindleElizabeth Perkins,RN also with me at this time, pt respirations remain in 24-28/min, O2 sats continue at 100% on Ra, pt still SOB, JVD noted, pt using accessory muscles to breathe, diminished lung sounds noted with crackles to left lower lobe, pt coughing but mostly non-productive, HOB remains elevated to 75 degrees, BP remains elevated, call to Dan,PA on call at [phone], assessment given, noted RN had charted "clear" lung sounds at 11 am this morning. Chest xray ordered.  Placed order, call to rapid response nurse to assess.

## 2016-12-11 NOTE — Progress Notes (Signed)
Standley Brooking, RN Rehab Admission Coordinator Signed Physical Medicine and Rehabilitation  PMR Pre-admission Date of Service: 12/10/2016 4:22 PM  Related encounter: ED to Hosp-Admission (Current) from 12/03/2016 in [facility] 78M NEURO MEDICAL       [] Hide copied text PMR Admission Coordinator Pre-Admission Assessment  Patient: Alex [name] is an 83 y.o., male MRN: [id number] DOB: 1931/04/30 Height: 6\' 2"  (188 cm) Weight: 115.5 kg (254 lb 11.2 oz)                                                                                                                                                  Insurance Information HMO: yes    PPO:      PCP:      IPA:      80/20:      OTHER: medicare advantage plan PRIMARY: [employer] Medicare      Policy#: [id number]      Subscriber: pt CM Name: Gerardo      Phone#: [phone]     Fax#: [phone] Pre-Cert#: [id number] approved for 7 days with follow up CM Velvet Bathe phone [phone] Fax: EPIC access      Employer: retired Benefits:  Phone #: [phone]     Name: 12/09/16 Eff. Date: 10/27/16     Deduct: none      Out of Pocket Max: $4400      Life Max: none CIR: $345 co pay per day days 1-5 then covers 100%      SNF: no co pay days 1-20: $160 co pay per day days 21-48: no co pay days 49-100 Outpatient: $40 co pay per visit     Co-Pay: visits per medical neccesity Home Health: 100%      Co-Pay: visits per medical neccesity DME: 80%     Co-Pay: 20% Providers: in network  SECONDARY: none        Medicaid Application Date:       Case Manager:  Disability Application Date:       Case Worker:   Emergency Contact Information        Contact Information    Name Relation Home Work Mobile   Wirkkala,[place] Spouse [phone]  [phone]   Mavin, Dyke   [phone]     Current Medical History  Patient Admitting Diagnosis: Traumatic SDH  History of Present Illness: HPI: VAMSI APFEL a 83 y.o.right handed  malewith history of morbid obesity, alcohol use, COPD with nighttime oxygen, obstructive sleep apnea with CPAP, atrial fibrillation/AVR/CABG/PPM maintained on aspirin and Coumadin. Presented 12/03/2016 after a recent fall and struck his head without loss of consciousness. Develop dry heaving gradual onset of moderate squeezing chest pain shortness of breath. CT of the head showed left parietal scalp hematoma. Acute right hemispheric subdural hematoma with maximal thickness in the right posterior parietal region measuring 15 mm.  Right to left shift of 7.5 mm. Troponin negative. INR of 4.14 that was reversed. Neurosurgery Dr. Marikay Alar advised conservative care. Hospital course confusion with delirium he had been started on Precedex. Soft restraints were added for patient safety. Reported grand mal seizure 12/04/2016 required intubation and currently maintained on Keppra. EEG showed occasional low amplitude epileptiform discharges over the right posterior temporal region 3 clinicoelectrographic seizures captured arising from the right hemisphere lasting 2-4 minutes. Patient remains off anticoagulation due to subdural hematoma. MBS 12/11/16 with pt now on full liquid diet with meds crushed in puree. No plans to replace cortrak at this time. Latest cranial CT scan 12/10/2016 reviewed, showingstable right subdural hematoma with stable mass effect. Per report, no new large territory infarct or parenchymal hemorrhage identified.  Past Medical History      Past Medical History:  Diagnosis Date  . Aortic stenosis    s/p AVR by Dr Laneta Simmers  . COPD (chronic obstructive pulmonary disease) (HCC)   . History of coronary artery disease    status post stenting of the marginal circumflex in 12/2003 and again in 2009  . Hyperlipidemia   . Morbid obesity (HCC)    weight 243 pounds, BMI 31.2kg/m2, BSA 2.36 square meters  . Obstructive sleep apnea    compliant with CPAP  . Persistent atrial fibrillation (HCC)      Family History  family history includes Alzheimer's disease in his mother; Other in his father; Tuberculosis in his father.  Prior Rehab/Hospitalizations:  Has the patient had major surgery during 100 days prior to admission? No  Current Medications   Current Facility-Administered Medications:  .  0.9 %  sodium chloride infusion, , Intravenous, Continuous, Tia Alert, MD, Last Rate: 75 mL/hr at 12/11/16 0357 .  acetaminophen (TYLENOL) tablet 650 mg, 650 mg, Oral, Q4H PRN, 650 mg at 12/03/16 1826 **OR** acetaminophen (TYLENOL) solution 650 mg, 650 mg, Per Tube, Q4H PRN **OR** acetaminophen (TYLENOL) suppository 650 mg, 650 mg, Rectal, Q4H PRN, Tia Alert, MD, 650 mg at 12/05/16 1216 .  acetaminophen-codeine (TYLENOL #3) 300-30 MG per tablet 1-2 tablet, 1-2 tablet, Oral, Q4H PRN, Tia Alert, MD, 2 tablet at 12/10/16 1605 .  amiodarone (PACERONE) tablet 200 mg, 200 mg, Oral, BID, Tia Alert, MD, 200 mg at 12/10/16 9147 .  chlorhexidine (PERIDEX) 0.12 % solution 15 mL, 15 mL, Mouth Rinse, BID, Tia Alert, MD, 15 mL at 12/10/16 2200 .  feeding supplement (JEVITY 1.2 CAL) liquid 1,000 mL, 1,000 mL, Per Tube, Continuous, Jose Alexis Frock, MD, Last Rate: 65 mL/hr at 12/10/16 0538, 1,000 mL at 12/10/16 0538 .  feeding supplement (PRO-STAT SUGAR FREE 64) liquid 30 mL, 30 mL, Per Tube, TID, Jose Alexis Frock, MD, 30 mL at 12/10/16 1604 .  folic acid (FOLVITE) tablet 1 mg, 1 mg, Per Tube, Daily, Tia Alert, MD, 1 mg at 12/10/16 [phone] .  insulin aspart (novoLOG) injection 0-15 Units, 0-15 Units, Subcutaneous, Q4H, Rahul P Desai, PA-C, 2 Units at 12/10/16 1707 .  ipratropium-albuterol (DUONEB) 0.5-2.5 (3) MG/3ML nebulizer solution 3 mL, 3 mL, Nebulization, Q2H PRN, Tia Alert, MD, 3 mL at 12/11/16 0421 .  labetalol (NORMODYNE,TRANDATE) injection 10 mg, 10 mg, Intravenous, Q2H PRN, Tia Alert, MD, 10 mg at 12/08/16 1952 .  levETIRAcetam (KEPPRA) 1,000 mg in sodium  chloride 0.9 % 100 mL IVPB, 1,000 mg, Intravenous, Q12H, Cyril Mourning V, MD, 1,000 mg at 12/11/16 0357 .  LORazepam (ATIVAN) injection  1 mg, 1 mg, Intravenous, Q2H PRN, Cyril Mourningakesh Alva V, MD, 1 mg at 12/10/16 2200 .  MEDLINE mouth rinse, 15 mL, Mouth Rinse, q12n4p, Tia Alertavid S Jones, MD, 15 mL at 12/10/16 1610 .  metoprolol tartrate (LOPRESSOR) 25 mg/10 mL oral suspension 12.5 mg, 12.5 mg, Per Tube, BID, Cyril Mourningakesh Alva V, MD, 12.5 mg at 12/10/16 0938 .  pantoprazole (PROTONIX) injection 40 mg, 40 mg, Intravenous, QHS, Tia Alertavid S Jones, MD, 40 mg at 12/09/16 2145 .  polyethylene glycol (MIRALAX / GLYCOLAX) packet 17 g, 17 g, Per Tube, Daily, Tia Alertavid S Jones, MD, 17 g at 12/10/16 [phone] .  potassium chloride 20 MEQ/15ML (10%) solution 10 mEq, 10 mEq, Oral, Daily, Tia Alertavid S Jones, MD .  senna-docusate (Senokot-S) tablet 1 tablet, 1 tablet, Oral, BID, Tia Alertavid S Jones, MD, 1 tablet at 12/10/16 [phone] .  thiamine (VITAMIN B-1) tablet 100 mg, 100 mg, Per Tube, Daily, Tia Alertavid S Jones, MD, 100 mg at 12/10/16 [phone]  Patients Current Diet: Diet full liquid Room service appropriate? Yes; Fluid consistency: Thin Diet - low sodium heart healthy . Pt removed cortrak 2/15, no plans to replace at this time due to MBS results today 12/11/16  Precautions / Restrictions Precautions Precautions: Fall Restrictions Weight Bearing Restrictions: No   Has the patient had 2 or more falls or a fall with injury in the past year?Yes. Pt with numerous falls over past 6 months per wife. Pt will not use AD due to pride and makes him look old. He has balance issues and ETOH abuse per wife.  Prior Activity Level Community (5-7x/wk): independent and driving pta. balance issues and refused AD  Home Assistive Devices / Equipment Home Assistive Devices/Equipment: None  Prior Device Use: Indicate devices/aids used by the patient prior to current illness, exacerbation or injury? None of the above  Prior Functional Level Prior Function Level of  Independence: Independent Comments: drives, active on property at home  Self Care: Did the patient need help bathing, dressing, using the toilet or eating?  Independent  Indoor Mobility: Did the patient need assistance with walking from room to room (with or without device)? Independent  Stairs: Did the patient need assistance with internal or external stairs (with or without device)? Independent  Functional Cognition: Did the patient need help planning regular tasks such as shopping or remembering to take medications? Independent  Current Functional Level Cognition  Arousal/Alertness: Awake/alert Overall Cognitive Status: Impaired/Different from baseline Difficult to assess due to: Level of arousal Current Attention Level: Selective Orientation Level: Oriented to person, Oriented to place, Oriented to time Following Commands: Follows one step commands consistently Safety/Judgement: Decreased awareness of safety General Comments: Pt joking appropriately.  repeatedly asking for water.  He is slow to initiate activity  Attention: Sustained Sustained Attention: Impaired Sustained Attention Impairment: Verbal basic, Functional basic Memory: Impaired Memory Impairment: Decreased recall of new information Awareness: Impaired Awareness Impairment: Intellectual impairment, Emergent impairment, Anticipatory impairment Problem Solving: Impaired Problem Solving Impairment: Verbal basic Behaviors: Other (comment) (frequent laughing, appears to try to cover deficits) Safety/Judgment: Impaired    Extremity Assessment (includes Sensation/Coordination)  Upper Extremity Assessment: LUE deficits/detail LUE Deficits / Details: Pt noted to have functional AROM in LUE, does not purposefully move to command. Increased edema noted. LUE Sensation: decreased light touch LUE Coordination: decreased fine motor, decreased gross motor  Lower Extremity Assessment: Defer to PT evaluation RLE  Deficits / Details: increased tone noted when attempting to move RLE toward EOB, question tone vs patient extending against therapist activity  LLE Deficits / Details: unable to assess    ADLs  Overall ADL's : Needs assistance/impaired Eating/Feeding: NPO Grooming: Wash/dry hands, Wash/dry face, Min guard, Sitting Grooming Details (indicate cue type and reason): Able to wash face with RUE once wash cloth presented. Pt was asking about toothbrush when gave him a swab with mouthwash on it it rubbed it on his lips despite telling him it was like a toothbrush Toilet Transfer: Minimal assistance, +2 for physical assistance, BSC, Squat-pivot Toilet Transfer Details (indicate cue type and reason): assist to shift weight  Toileting- Clothing Manipulation and Hygiene: Maximal assistance, Sit to/from stand Functional mobility during ADLs: Moderate assistance, +2 for physical assistance General ADL Comments: Total assist for ADL at this time. Tolerated sitting EOB 10 minutes with VSS throughout; total assist provided for blocking knees and posterior push.    Mobility  Overal bed mobility: Needs Assistance Bed Mobility: Supine to Sit Rolling: Total assist, +2 for physical assistance Supine to sit: Min assist, HOB elevated Sit to supine: +2 for physical assistance, Max assist General bed mobility comments: Pt required assist to lift trunk.  cues to scoot hips to EOB     Transfers  Overall transfer level: Needs assistance Equipment used: 2 person hand held assist Transfers: Sit to/from Stand, [employer] Transfers Sit to Stand: Mod assist, +2 physical assistance Squat pivot transfers: Min assist, +2 physical assistance General transfer comment: Pt requires mod A +2 to boost into standing.   He performed squat pivot transfer to recliner requiring cues for hand placement and assist to shift hips/weight.     Ambulation / Gait / Stairs / Engineer, drilling / Balance Dynamic  Sitting Balance Sitting balance - Comments: Able to sit EOB without UE support. Balance Overall balance assessment: Needs assistance Sitting-balance support: Feet supported, No upper extremity supported Sitting balance-[name]: Fair Sitting balance - Comments: Able to sit EOB without UE support. Postural control: Posterior lean Standing balance support: Bilateral upper extremity supported, During functional activity Standing balance-[name]: Poor Standing balance comment: Pt able to stand with min guard assist with bil. UE support. Pt with posterior lean and sway at times requiring Min A. Stood for ~5 mins for pericare and to change sheets. Marching in standing with BUE supported. No knee buckling.    Special needs/care consideration BiPAP/CPAP did not use pta. Only used in ED this admission CPM  N/a Continuous Drip IV  N/a Dialysis  N/a Life Vest  N/a Oxygen  Uses O2 at 2 liters nasal cannula at HS pta Special Bed  N/a Trach Size  N/a Wound Vac (area)  N/a Skin ecchymosis BUE; skin tear left arm Bowel mgmt: incontinent LBM 2/14 Bladder mgmt: catheter Diabetic mgmt  N/a Pt is HOH per wife Mitts used with cortrak usage Decreased safety awareness ; needs bed and chair alarm   Previous Home Environment Living Arrangements: Spouse/significant other  Lives With: Spouse Available Help at Discharge: Family, Available 24 hours/day Type of Home: House Home Layout: One level Home Access: Stairs to enter Entrance Stairs-Rails: Right Entrance Stairs-Number of Steps: 3 Bathroom Shower/Tub: Health visitor: Standard Bathroom Accessibility: Yes How Accessible: Accessible via walker Home Care Services: No Additional Comments: wife can provide supervision level  Discharge Living Setting Plans for Discharge Living Setting: Patient's home, Lives with (comment) (wife) Type of Home at Discharge: House Discharge Home Layout: One level Discharge Home Access: Stairs  to enter Entrance Stairs-Rails: Right Entrance Stairs-Number of Steps:  3 Discharge Bathroom Shower/Tub: Walk-in shower Discharge Bathroom Toilet: Standard Discharge Bathroom Accessibility: Yes How Accessible: Accessible via walker Does the patient have any problems obtaining your medications?: No  Social/Family/Support Systems Patient Roles: Spouse, Parent Contact Information: [place] Schwabe, wife Anticipated Caregiver: wife and hired Child psychotherapist Information: see above Ability/Limitations of Caregiver: wife can provide supervision level, would hire assist if needed Caregiver Availability: 24/7 Discharge Plan Discussed with Primary Caregiver: Yes Is Caregiver In Agreement with Plan?: Yes Does Caregiver/Family have Issues with Lodging/Transportation while Pt is in Rehab?: No  Goals/Additional Needs Patient/Family Goal for Rehab: supervision to min assist with PT, OT, and SLP Expected length of stay: ELOS 20 -25 days Additional Information: Pt with history of alcohol abuse per wife Pt/Family Agrees to Admission and willing to participate: Yes Program Orientation Provided & Reviewed with Pt/Caregiver Including Roles  & Responsibilities: Yes  Decrease burden of Care through IP rehab admission: n/a  Possible need for SNF placement upon discharge: if patient does not reach supervision level or light min assist that wife can not manage at home, SNF is an option per wife. Pt has a long term care policy that pays for care in the home as well as at SNF. Son discussing SNF option with his Mom after CIR per wife.  Patient Condition: This patient's medical and functional status has changed since the consult dated 12/08/2016 in which the Rehabilitation Physician determined and documented that the patient was potentially appropriate for intensive rehabilitative care in an inpatient rehabilitation facility. Issues have been addressed and update has been discussed with Dr.  Riley Kill and patient now appropriate for inpatient rehabilitation. Pt has progressed functionally well on Acute hospital and wife would like to see if he could make progress to SNF level at thi=ome rather than SNF.Will admit to inpatient rehab today.   Preadmission Screen Completed By:  Clois Dupes, 12/11/2016 10:49 AM ______________________________________________________________________   Discussed status with Dr. Riley Kill on 12/11/2016 at  1056  and received telephone approval for admission today.  Admission Coordinator:  Clois Dupes, time 1610 Date 12/11/2016       Cosigned by: [name], MD at 12/11/2016 11:29 AM  Revision History

## 2016-12-11 NOTE — Care Management Important Message (Signed)
Important Message  Patient Details  Name: Alex [name] MRN: [id number] Date of Birth: Oct 27, 1931   Medicare Important Message Given:  Yes    Shakeyla Giebler Stefan ChurchBratton 12/11/2016, 11:44 AM

## 2016-12-11 NOTE — Progress Notes (Signed)
Patient ID: Alex DuhamelJohn R [name], male   DOB: Nov 22, 1930, 83 y.o.   MRN: [id number] Patient received approximately 1830 via bed. Patent alert and oriented to self. Oriented patient to room and call bell system. waist belt intact sr up x 4. Patient verbalized understanding of safety request but continue to reinforce.   B/P elevated 151/101. Will recheck after patient is settled in bed. Next shift RN aware.  Continue with plan of care. Cleotilde NeerJoyce, Trampus Mcquerry S

## 2016-12-11 NOTE — Progress Notes (Signed)
Modified Barium Swallow Progress Note  Patient Details  Name: Alex DuhamelJohn R Leveque MRN: [id number] Date of Birth: 1931/05/21  Today's Date: 12/11/2016  Modified Barium Swallow completed.  Full report located under Chart Review in the Imaging Section.  Brief recommendations include the following:  Clinical Impression  Pt has a moderate oral dysphagia felt to be largely cognitive in nature, but otherwise with relatively intact pharyngeal phase. He has oral holding and decreased bolus cohesion, needing Mod cues to initiate posterior transit. He has premature spillage into the pharynx and lingual residue that remains post-swallow. With large straw sips, pt showed piecemeal swallowing. Recommend to initiate full liquid diet to decrease oral burden. Suspect that as mentation clears, pt will have good prognosis for solid advancement.   Swallow Evaluation Recommendations       SLP Diet Recommendations: Thin liquid;Other (Comment) (full liquid diet)   Liquid Administration via: Cup;Straw   Medication Administration: Crushed with puree   Supervision: Staff to assist with self feeding;Full supervision/cueing for compensatory strategies   Compensations: Minimize environmental distractions;Slow rate;Small sips/bites;Follow solids with liquid;Other (Comment) (check for residue)   Postural Changes: Seated upright at 90 degrees   Oral Care Recommendations: Oral care BID;Other (Comment) (after meals)   Other Recommendations: Have oral suction available    Maxcine Hamaiewonsky, Zain Bingman 12/11/2016,10:16 AM   Maxcine HamLaura Paiewonsky, M.A. CCC-SLP [phone]

## 2016-12-11 NOTE — Progress Notes (Signed)
Pt being transferred to inpatient rehab. Pt and family made aware of transfer. Pt and family verbalized understanding of transfer. All questions and concerns were addressed. RN gave report to RN, Doree FudgeLuz, about pt. IV in right hand was removed before transfer. Pt transferred via bed with oxygen.

## 2016-12-11 NOTE — Discharge Summary (Signed)
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 BID;Other (Comment) Other Recommendations Have oral suction available   [facility] IP FOLLOW UP RECOMMENDATIONS 12/11/2016 Follow up Recommendations Inpatient Rehab   [facility] IP FREQUENCY AND DURATION 12/11/2016 Speech Therapy Frequency (ACUTE ONLY) min 2x/week Treatment Duration 2 weeks      [facility] IP ORAL PHASE 12/11/2016 Oral Phase Impaired Oral - Pudding Teaspoon -- Oral - Pudding Cup -- Oral - Honey Teaspoon -- Oral - Honey Cup -- Oral - Nectar Teaspoon -- Oral - Nectar Cup -- Oral - Nectar Straw -- Oral - Thin Teaspoon Weak lingual manipulation;Reduced posterior propulsion;Holding of bolus;Lingual/palatal residue;Delayed oral transit;Decreased bolus cohesion;Premature spillage Oral - Thin Cup Weak lingual manipulation;Reduced posterior propulsion;Holding of bolus;Lingual/palatal residue;Delayed oral transit;Decreased bolus cohesion;Premature spillage Oral - Thin Straw Weak lingual manipulation;Reduced posterior propulsion;Holding of bolus;Lingual/palatal residue;Delayed oral transit;Decreased bolus cohesion;Piecemeal swallowing;Premature spillage Oral -  Puree Weak lingual manipulation;Reduced posterior propulsion;Holding of bolus;Lingual/palatal residue;Delayed oral transit;Decreased bolus cohesion;Premature spillage Oral - Mech Soft -- Oral - Regular -- Oral - Multi-Consistency -- Oral - Pill -- Oral Phase - Comment --  [facility] IP PHARYNGEAL PHASE 12/11/2016 Pharyngeal Phase WFL Pharyngeal- Pudding Teaspoon -- Pharyngeal -- Pharyngeal- Pudding Cup -- Pharyngeal -- Pharyngeal- Honey Teaspoon -- Pharyngeal -- Pharyngeal- Honey Cup -- Pharyngeal -- Pharyngeal- Nectar Teaspoon -- Pharyngeal -- Pharyngeal- Nectar Cup -- Pharyngeal -- Pharyngeal- Nectar Straw -- Pharyngeal -- Pharyngeal- Thin Teaspoon -- Pharyngeal -- Pharyngeal- Thin Cup -- Pharyngeal -- Pharyngeal- Thin Straw -- Pharyngeal -- Pharyngeal- Puree -- Pharyngeal -- Pharyngeal- Mechanical Soft -- Pharyngeal -- Pharyngeal- Regular -- Pharyngeal -- Pharyngeal- Multi-consistency -- Pharyngeal -- Pharyngeal- Pill -- Pharyngeal -- Pharyngeal Comment --  [facility] IP CERVICAL ESOPHAGEAL PHASE 12/11/2016 Cervical Esophageal Phase WFL Pudding Teaspoon -- Pudding Cup -- Honey Teaspoon -- Honey Cup -- Nectar Teaspoon -- Nectar Cup -- Nectar Straw -- Thin Teaspoon -- Thin Cup -- Thin Straw -- Puree -- Mechanical Soft -- Regular -- Multi-consistency -- Pill -- Cervical Esophageal Comment -- No flowsheet data found. Maxcine Hamaiewonsky, Laura 12/11/2016, 10:14 AM  Maxcine HamLaura Paiewonsky, M.A. CCC-SLP [phone]              Antibiotics:  Anti-infectives    None      Discharge Exam: Blood pressure (!) 177/92, pulse 65, temperature 98.1 F (36.7 C), temperature source Oral, resp. rate 20, height 6\' 2"  (1.88 m), weight 115.5 kg (254 lb 11.2 oz), SpO2 96 %. Neurologic: Grossly normal with some disorientation   Discharge Medications:   Allergies as of 12/11/2016   No Known Allergies     Medication List    STOP taking these medications  warfarin 2.5 MG tablet Commonly known as:  COUMADIN     TAKE these medications    ALPRAZolam 0.25 MG tablet Commonly known as:  XANAX Take 0.25 mg by mouth at bedtime.   amiodarone 200 MG tablet Commonly known as:  PACERONE Take 1 tablet (200 mg total) by mouth 2 (two) times daily.   aspirin 81 MG tablet Take 81 mg by mouth daily.   atorvastatin 80 MG tablet Commonly known as:  LIPITOR TAKE 1 TABLET BY MOUTH DAILY   escitalopram 10 MG tablet Commonly known as:  LEXAPRO Take 10 mg by mouth at bedtime.   furosemide 20 MG tablet Commonly known as:  LASIX Take 1 tablet (20mg ) by mouth daily. May take extra tablet for weight gain >3lbs   HYDROcodone-acetaminophen 5-325 MG tablet Commonly known as:  NORCO/VICODIN Take 1 tablet by mouth 2 (two) times daily as needed for moderate pain.   ibuprofen 200 MG tablet Commonly known as:  ADVIL,MOTRIN Take 400 mg by mouth daily as needed for moderate pain.   Magnesium 250 MG Tabs Take 1 tablet (250 mg total) by mouth daily.   metoprolol succinate 25 MG 24 hr tablet Commonly known as:  TOPROL-XL Take 12.5 mg by mouth 2 (two) times daily.   potassium chloride 10 MEQ tablet Commonly known as:  K-DUR Take 1 tablet ( ) by mouth daily. Take extra 1 tablet if taking extra dose of lasix   VITAMIN B COMPLEX PO Take 1 tablet by mouth daily.   zolpidem 10 MG tablet Commonly known as:  AMBIEN Take 5-10 mg by mouth at bedtime.       Disposition: CIR   Final Dx: Right subdural hematoma  Discharge Instructions    Diet - low sodium heart healthy    Complete by:  As directed    Increase activity slowly    Complete by:  As directed          Signed: Jassica Zazueta S 12/11/2016, 10:22 AM

## 2016-12-12 ENCOUNTER — Inpatient Hospital Stay (HOSPITAL_COMMUNITY): Payer: Medicare Other | Admitting: Speech Pathology

## 2016-12-12 ENCOUNTER — Inpatient Hospital Stay (HOSPITAL_COMMUNITY): Payer: Medicare Other | Admitting: Occupational Therapy

## 2016-12-12 ENCOUNTER — Other Ambulatory Visit: Payer: Self-pay

## 2016-12-12 ENCOUNTER — Inpatient Hospital Stay (HOSPITAL_COMMUNITY): Payer: Medicare Other | Admitting: Physical Therapy

## 2016-12-12 ENCOUNTER — Inpatient Hospital Stay (HOSPITAL_COMMUNITY): Payer: Medicare Other

## 2016-12-12 DIAGNOSIS — I4892 Unspecified atrial flutter: Secondary | ICD-10-CM | POA: Diagnosis present

## 2016-12-12 DIAGNOSIS — I5032 Chronic diastolic (congestive) heart failure: Secondary | ICD-10-CM | POA: Diagnosis not present

## 2016-12-12 DIAGNOSIS — J810 Acute pulmonary edema: Secondary | ICD-10-CM

## 2016-12-12 DIAGNOSIS — I484 Atypical atrial flutter: Secondary | ICD-10-CM

## 2016-12-12 DIAGNOSIS — E876 Hypokalemia: Secondary | ICD-10-CM

## 2016-12-12 DIAGNOSIS — I25810 Atherosclerosis of coronary artery bypass graft(s) without angina pectoris: Secondary | ICD-10-CM

## 2016-12-12 LAB — CBC WITH DIFFERENTIAL/PLATELET
BASOS PCT: 0 %
Basophils Absolute: 0 10*3/uL (ref 0.0–0.1)
EOS ABS: 0.1 10*3/uL (ref 0.0–0.7)
Eosinophils Relative: 0 %
HCT: 41.9 % (ref 39.0–52.0)
HEMOGLOBIN: 13.1 g/dL (ref 13.0–17.0)
Lymphocytes Relative: 9 %
Lymphs Abs: 1 10*3/uL (ref 0.7–4.0)
MCH: 32.9 pg (ref 26.0–34.0)
MCHC: 31.3 g/dL (ref 30.0–36.0)
MCV: 105.3 fL — ABNORMAL HIGH (ref 78.0–100.0)
MONO ABS: 0.7 10*3/uL (ref 0.1–1.0)
MONOS PCT: 6 %
NEUTROS PCT: 85 %
Neutro Abs: 9.5 10*3/uL — ABNORMAL HIGH (ref 1.7–7.7)
Platelets: 227 10*3/uL (ref 150–400)
RBC: 3.98 MIL/uL — ABNORMAL LOW (ref 4.22–5.81)
RDW: 13.7 % (ref 11.5–15.5)
WBC: 11.2 10*3/uL — ABNORMAL HIGH (ref 4.0–10.5)

## 2016-12-12 LAB — COMPREHENSIVE METABOLIC PANEL
ALBUMIN: 3.3 g/dL — AB (ref 3.5–5.0)
ALK PHOS: 62 U/L (ref 38–126)
ALT: 38 U/L (ref 17–63)
ANION GAP: 9 (ref 5–15)
AST: 43 U/L — ABNORMAL HIGH (ref 15–41)
BILIRUBIN TOTAL: 1.7 mg/dL — AB (ref 0.3–1.2)
BUN: 22 mg/dL — ABNORMAL HIGH (ref 6–20)
CALCIUM: 9.1 mg/dL (ref 8.9–10.3)
CO2: 29 mmol/L (ref 22–32)
Chloride: 109 mmol/L (ref 101–111)
Creatinine, Ser: 0.8 mg/dL (ref 0.61–1.24)
GFR calc non Af Amer: >60 mL/min (ref >60)
Glucose, Bld: 129 mg/dL — ABNORMAL HIGH (ref 65–99)
POTASSIUM: 3.7 mmol/L (ref 3.5–5.1)
SODIUM: 147 mmol/L — AB (ref 135–145)
TOTAL PROTEIN: 6.8 g/dL (ref 6.5–8.1)

## 2016-12-12 LAB — URINALYSIS, ROUTINE W REFLEX MICROSCOPIC
BACTERIA UA: NONE SEEN
Bilirubin Urine: NEGATIVE
GLUCOSE, UA: NEGATIVE mg/dL
Hgb urine dipstick: NEGATIVE
Ketones, ur: NEGATIVE mg/dL
Leukocytes, UA: NEGATIVE
Nitrite: NEGATIVE
PROTEIN: 100 mg/dL — AB
SQUAMOUS EPITHELIAL / LPF: NONE SEEN
Specific Gravity, Urine: 1.02 (ref 1.005–1.030)
pH: 7 (ref 5.0–8.0)

## 2016-12-12 LAB — CK TOTAL AND CKMB (NOT AT ARMC)
CK TOTAL: 121 U/L (ref 49–397)
CK, MB: 4.1 ng/mL (ref 0.5–5.0)
RELATIVE INDEX: 3.4 — AB (ref 0.0–2.5)

## 2016-12-12 LAB — TROPONIN I: Troponin I: 0.04 ng/mL (ref <0.03)

## 2016-12-12 MED ORDER — ESCITALOPRAM OXALATE 10 MG PO TABS
10.0000 mg | ORAL_TABLET | Freq: Every day | ORAL | Status: DC
  Administered 2016-12-12 – 2016-12-15 (×4): 10 mg via ORAL
  Filled 2016-12-12 (×4): qty 1

## 2016-12-12 MED ORDER — METOPROLOL TARTRATE 25 MG/10 ML ORAL SUSPENSION
50.0000 mg | Freq: Two times a day (BID) | ORAL | Status: DC
  Administered 2016-12-12 – 2016-12-15 (×6): 50 mg via ORAL
  Filled 2016-12-12 (×6): qty 20

## 2016-12-12 MED ORDER — ENSURE ENLIVE PO LIQD
237.0000 mL | Freq: Three times a day (TID) | ORAL | Status: DC
  Administered 2016-12-12 – 2016-12-14 (×6): 237 mL via ORAL

## 2016-12-12 MED ORDER — ALPRAZOLAM 0.25 MG PO TABS
0.2500 mg | ORAL_TABLET | Freq: Every day | ORAL | Status: DC
  Administered 2016-12-12 – 2016-12-15 (×4): 0.25 mg via ORAL
  Filled 2016-12-12 (×4): qty 1

## 2016-12-12 MED ORDER — PRO-STAT SUGAR FREE PO LIQD
30.0000 mL | Freq: Two times a day (BID) | ORAL | Status: DC
  Administered 2016-12-12 – 2016-12-15 (×7): 30 mL via ORAL
  Filled 2016-12-12 (×7): qty 30

## 2016-12-12 MED ORDER — FUROSEMIDE 10 MG/ML IJ SOLN
20.0000 mg | Freq: Once | INTRAMUSCULAR | Status: AC
  Administered 2016-12-12: 20 mg via INTRAVENOUS
  Filled 2016-12-12: qty 2

## 2016-12-12 MED ORDER — POTASSIUM CHLORIDE 20 MEQ/15ML (10%) PO SOLN
20.0000 meq | Freq: Every day | ORAL | Status: DC
  Administered 2016-12-13 – 2016-12-14 (×2): 20 meq via ORAL
  Filled 2016-12-12 (×2): qty 15

## 2016-12-12 MED ORDER — DOCUSATE SODIUM 50 MG/5ML PO LIQD
100.0000 mg | Freq: Two times a day (BID) | ORAL | Status: DC
  Administered 2016-12-12 – 2016-12-15 (×8): 100 mg via ORAL
  Filled 2016-12-12 (×9): qty 10

## 2016-12-12 MED ORDER — LEVALBUTEROL HCL 0.63 MG/3ML IN NEBU: 0.6300 mg | INHALATION_SOLUTION | Freq: Four times a day (QID) | RESPIRATORY_TRACT | Status: DC | PRN

## 2016-12-12 MED ORDER — FUROSEMIDE 10 MG/ML IJ SOLN
40.0000 mg | Freq: Once | INTRAMUSCULAR | Status: AC
  Administered 2016-12-12: 40 mg via INTRAVENOUS
  Filled 2016-12-12: qty 4

## 2016-12-12 MED ORDER — FUROSEMIDE 20 MG PO TABS
20.0000 mg | ORAL_TABLET | Freq: Every day | ORAL | Status: DC
  Filled 2016-12-12 (×2): qty 1

## 2016-12-12 MED ORDER — SENNOSIDES 8.8 MG/5ML PO SYRP
5.0000 mL | ORAL_SOLUTION | Freq: Two times a day (BID) | ORAL | Status: DC
  Administered 2016-12-12 – 2016-12-15 (×6): 5 mL via ORAL
  Filled 2016-12-12 (×7): qty 5

## 2016-12-12 MED ORDER — FUROSEMIDE 40 MG PO TABS
40.0000 mg | ORAL_TABLET | Freq: Every day | ORAL | Status: DC
Start: 2016-12-13 — End: 2016-12-15
  Administered 2016-12-13 – 2016-12-15 (×3): 40 mg via ORAL
  Filled 2016-12-12 (×3): qty 1

## 2016-12-12 MED ORDER — ATORVASTATIN CALCIUM 80 MG PO TABS
80.0000 mg | ORAL_TABLET | Freq: Every day | ORAL | Status: DC
  Administered 2016-12-12 – 2016-12-15 (×3): 80 mg via ORAL
  Filled 2016-12-12 (×4): qty 1

## 2016-12-12 NOTE — Progress Notes (Signed)
Pt was reassessed BP 190/100, P 110 irregular, R 26, Temp 98.6. Pt restless and complains of headache. Respirations are labored and pt continues to have rhonchi and wheezing. O2 sat is 100% with 2.5 l per nasal canula. Marland Kitchen.PA  Ang[place] contacted by phone let to know the pt. Status. Order of 20 mg lasix ordered stat. 1 mg ativan to be given stat.

## 2016-12-12 NOTE — Progress Notes (Signed)
[facility] PHYSICAL [facility]ICINE & REHABILITATION     PROGRESS NOTE    Subjective/Complaints: Pt with increased work of breathing, wheezing per Charity fundraiserN. Pt states he's feeling a little better after administration of lasix early this morning  ROS: Limited due cognitive/behavioral   Objective: Vital Signs: Blood pressure (!) 172/100, pulse 100, temperature 97.7 F (36.5 C), temperature source Axillary, resp. rate (!) 24, height 6\' 2"  (1.88 m), weight 114.8 kg (253 lb 1.4 oz), SpO2 92 %. Dg Chest 2 View  Result Date: 12/12/2016 CLINICAL DATA:  Respiratory distress EXAM: CHEST  2 VIEW COMPARISON:  12/03/2016 FINDINGS: Low lung volumes with mild interstitial edema. Borderline cardiomegaly with right atrial and right ventricular pacing leads noted. Left-sided pacemaker apparatus projects over the left shoulder. There is aortic atherosclerosis at the arch. Median sternotomy sutures are in place. Mild left basilar atelectasis. Layering small to moderate posterior pleural effusions. Degenerative change along the dorsal spine. IMPRESSION: Findings consistent with pulmonary edema with small to moderate posterior pleural effusions. Electronically Signed   By: Tollie Ethavid  Kwon M.D.   On: 12/12/2016 00:15   Dg Swallowing Func-speech Pathology  Result Date: 12/11/2016 Objective Swallowing Evaluation: Type of Study: MBS-Modified Barium Swallow Study Patient Details Name: Jetty DuhamelJohn R Bullis MRN: [id number] Date of Birth: 07-Mar-1931 Today's Date: 12/11/2016 Time: SLP Start Time (ACUTE ONLY): 0924-SLP Stop Time (ACUTE ONLY): 0948 SLP Time Calculation (min) (ACUTE ONLY): 24 min Past Medical History: Past Medical History: Diagnosis Date . Aortic stenosis   s/p AVR by Dr Laneta SimmersBartle . COPD (chronic obstructive pulmonary disease) (HCC)  . History of coronary artery disease   status post stenting of the marginal circumflex in 12/2003 and again in 2009 . Hyperlipidemia  . Morbid obesity (HCC)   weight 243 pounds, BMI 31.2kg/m2, BSA 2.36 square  meters . Obstructive sleep apnea   compliant with CPAP . Persistent atrial fibrillation [facility](HCC)  Past Surgical History: Past Surgical History: Procedure Laterality Date . AORTIC VALVE REPLACEMENT (AVR)/CORONARY ARTERY BYPASS GRAFTING (CABG)   08/05/2011  LIMA to LAD, sequential saphenous vein graft to third and fourth obtuse marginal branches of the circumflex, aortic valve replacement using a 23 mm Edwards pericardial valve . APPENDECTOMY   . CARDIOVERSION N/A 11/14/2014  Procedure: CARDIOVERSION;  Surgeon: Donato SchultzMark Skains, MD;  [facility];  Service: Cardiovascular;  Laterality: N/A; . CARDIOVERSION N/A 11/20/2016  Procedure: CARDIOVERSION;  Surgeon: Pricilla RifflePaula Ross V, MD;  [facility];  Service: Cardiovascular;  Laterality: N/A; . CAROTID ENDARTERECTOMY    Dr Lollie Sailsale Williams . EP IMPLANTABLE DEVICE N/A 11/06/2015  Procedure: PPM Generator Changeout;for sick sinus syndrome with a MDT Adapta L PPM, chronically elevated RV threshold. . permanent pacemaker    MDT EnRhythm implanted by Dr Lawanda CousinsAl-Kori in [place] for complete heart block with syncope . REPLACEMENT TOTAL KNEE BILATERAL    2006 . TEE WITHOUT CARDIOVERSION N/A 11/14/2014  Procedure: TRANSESOPHAGEAL ECHOCARDIOGRAM (TEE);  Surgeon: Donato SchultzMark Skains, MD;  [facility];  Service: Cardiovascular;  Laterality: N/A; HPI: 83 year old male with right-sided subdural hematoma with seizures. PMHx: Aortic stenosis, COPD, Hx of CAD, Obesity, Obstructive sleep apnea, Afib.  Subjective: pt alert, needs cueing Assessment / Plan / Recommendation CHL IP CLINICAL IMPRESSIONS 12/11/2016 Therapy Diagnosis Moderate oral phase dysphagia  Clinical Impression Pt has a moderate oral dysphagia felt to be largely cognitive in nature, but otherwise with relatively intact pharyngeal phase. He has oral holding and decreased bolus cohesion, needing Mod cues to initiate posterior transit. He has premature spillage into the pharynx  and lingual residue that remains post-swallow. With  large straw sips, pt showed piecemeal swallowing. Recommend to initiate full liquid diet to decrease oral burden. Suspect that as mentation clears, pt will have good prognosis for solid advancement.  Impact on safety and function Mild aspiration risk   [facility]L IP TREATMENT RECOMMENDATION 12/11/2016 Treatment Recommendations Therapy as outlined in treatment plan below   Prognosis 12/11/2016 Prognosis for Safe Diet Advancement Good Barriers to Reach Goals Cognitive deficits Barriers/Prognosis Comment -- [facility] IP DIET RECOMMENDATION 12/11/2016 SLP Diet Recommendations Thin liquid;Other (Comment) Liquid Administration via Cup;Straw Medication Administration Crushed with puree Compensations Minimize environmental distractions;Slow rate;Small sips/bites;Follow solids with liquid;Other (Comment) Postural Changes Seated upright at 90 degrees   [facility] IP OTHER RECOMMENDATIONS 12/11/2016 Recommended Consults -- Oral Care Recommendations Oral care BID;Other (Comment) Other Recommendations Have oral suction available   [facility] IP FOLLOW UP RECOMMENDATIONS 12/11/2016 Follow up Recommendations Inpatient Rehab   [facility] IP FREQUENCY AND DURATION 12/11/2016 Speech Therapy Frequency (ACUTE ONLY) min 2x/week Treatment Duration 2 weeks      [facility] IP ORAL PHASE 12/11/2016 Oral Phase Impaired Oral - Pudding Teaspoon -- Oral - Pudding Cup -- Oral - Honey Teaspoon -- Oral - Honey Cup -- Oral - Nectar Teaspoon -- Oral - Nectar Cup -- Oral - Nectar Straw -- Oral - Thin Teaspoon Weak lingual manipulation;Reduced posterior propulsion;Holding of bolus;Lingual/palatal residue;Delayed oral transit;Decreased bolus cohesion;Premature spillage Oral - Thin Cup Weak lingual manipulation;Reduced posterior propulsion;Holding of bolus;Lingual/palatal residue;Delayed oral transit;Decreased bolus cohesion;Premature spillage Oral - Thin Straw Weak lingual manipulation;Reduced posterior propulsion;Holding of bolus;Lingual/palatal residue;Delayed oral transit;Decreased bolus  cohesion;Piecemeal swallowing;Premature spillage Oral - Puree Weak lingual manipulation;Reduced posterior propulsion;Holding of bolus;Lingual/palatal residue;Delayed oral transit;Decreased bolus cohesion;Premature spillage Oral - Mech Soft -- Oral - Regular -- Oral - Multi-Consistency -- Oral - Pill -- Oral Phase - Comment --  CHL IP PHARYNGEAL PHASE 12/11/2016 Pharyngeal Phase WFL Pharyngeal- Pudding Teaspoon -- Pharyngeal -- Pharyngeal- Pudding Cup -- Pharyngeal -- Pharyngeal- Honey Teaspoon -- Pharyngeal -- Pharyngeal- Honey Cup -- Pharyngeal -- Pharyngeal- Nectar Teaspoon -- Pharyngeal -- Pharyngeal- Nectar Cup -- Pharyngeal -- Pharyngeal- Nectar Straw -- Pharyngeal -- Pharyngeal- Thin Teaspoon -- Pharyngeal -- Pharyngeal- Thin Cup -- Pharyngeal -- Pharyngeal- Thin Straw -- Pharyngeal -- Pharyngeal- Puree -- Pharyngeal -- Pharyngeal- Mechanical Soft -- Pharyngeal -- Pharyngeal- Regular -- Pharyngeal -- Pharyngeal- Multi-consistency -- Pharyngeal -- Pharyngeal- Pill -- Pharyngeal -- Pharyngeal Comment --  CHL IP CERVICAL ESOPHAGEAL PHASE 12/11/2016 Cervical Esophageal Phase WFL Pudding Teaspoon -- Pudding Cup -- Honey Teaspoon -- Honey Cup -- Nectar Teaspoon -- Nectar Cup -- Nectar Straw -- Thin Teaspoon -- Thin Cup -- Thin Straw -- Puree -- Mechanical Soft -- Regular -- Multi-consistency -- Pill -- Cervical Esophageal Comment -- No flowsheet data found. Maxcine Ham 12/11/2016, 10:14 AM  Maxcine Ham, M.A. CCC-SLP [phone]              Recent Labs  12/12/16 0503  WBC 11.2*  HGB 13.1  HCT 41.9  PLT 227    Recent Labs  12/12/16 0503  NA 147*  K 3.7  CL 109  GLUCOSE 129*  BUN 22*  CREATININE 0.80  CALCIUM 9.1   CBG (last 3)   Recent Labs  12/11/16 0813 12/11/16 1130 12/11/16 1656  GLUCAP 102* 114* 115*    Wt Readings from Last 3 Encounters:  12/12/16 114.8 kg (253 lb 1.4 oz)  12/11/16 115.5 kg (254 lb 11.2 oz)  12/01/16 105.2 kg (232 lb)    Physical Exam:  HENT:  Head: Normocephalic.  Eyes: EOMare normal. Left eye exhibits no discharge.  Neck: Normal range of motion. Neck supple. No tracheal deviationpresent. No thyromegalypresent.  Cardiovascular: IRR/IRR tachy Respiratory: using accessory muscles to breathe. Some sob with speech.  GI: Soft. Bowel sounds are normal. He exhibits mild distension. There is no tenderness.  Musculoskeletal: He exhibits no edemaor tenderness.  Skin: Skin is warmand dry.  Neurological.more distracted. Having difficulty following simple commands.  Patient moves all extremities and senses pain.  Psych: distracted/ non-agitated.    Assessment/Plan: 1. Functional and cognitive deficits secondary to TBI which require 3+ hours per day of interdisciplinary therapy in a comprehensive inpatient rehab setting. Physiatrist is providing close team supervision and 24 hour management of active medical problems listed below. Physiatrist and rehab team continue to assess barriers to discharge/monitor patient progress toward functional and medical goals.  Function:  Bathing Bathing position      Bathing parts      Bathing assist        Upper Body Dressing/Undressing Upper body dressing                    Upper body assist        Lower Body Dressing/Undressing Lower body dressing                                  Lower body assist        Toileting Toileting          Toileting assist     Transfers Chair/bed transfer             Locomotion Ambulation           Wheelchair          Cognition Comprehension    Expression    Social Interaction    Problem Solving    Memory     Medical Problem List and Plan: 1. Decreased functional mobility with altered mental statussecondary to traumatic right SDH -begin CIR therapies as tolerated 2. DVT Prophylaxis/Anticoagulation: SCDs. 3. Pain Management: Tylenol as needed 4. Mood: Ativan as needed 5. Neuropsych: This  patient is not yetcapable of making decisions on hisown behalf. 6. Skin/Wound Care: Routine skin checks 7. Fluids/Electrolytes/Nutrition: Routine I&O with follow-up chemistries 8.Dysphagia.Full Liquid diet.Speech therapy follow-up. 9.Atrial fibrillation/AVR/CABG/PPM. Coumadin discontinued due to subdural hematoma. Cardiac rate control. Amiodarone 200 mg twice a day, Lopressor 12.5 mg twice a day  -appears to have gone into flash pulmonary edema  -weight up 10kg compaired to 12/01/16  -lasix 20mg  + 20mg  given earlier this morning. Give another 40mg  IV lasix now  -place foley cath  -labs/ekg ordered  -nebs,/supplemental oxygen  -cards consult 10.COPD. Continue BiPAP/  -nebs 11.Seizure disorder. Keppra 1000 mg every 12 hours. Monitor for any seizure activity 12.Alcohol abuse. Monitor for withdrawal. Provide counseling as appropriate 13.Hypokalemia. I personally reviewed the patient's labs today.   -potassium supplement 14.Constipation. Laxative assistance   LOS (Days) 1 A FACE TO FACE EVALUATION WAS PERFORMED  Faith Rogue T, MD 12/12/2016 9:00 AM

## 2016-12-12 NOTE — Evaluation (Signed)
Physical Therapy Assessment and Plan  Patient Details  Name: Alex [name] MRN: [id number] Date of Birth: 01-18-1931  PT Diagnosis: Difficulty walking, Impaired cognition, Impaired sensation and Muscle weakness Rehab Potential: Fair ELOS: 21-28 days   Today's Date: 12/12/2016 PT Individual Time: 1300-1330 PT Individual Time Calculation (min): 30 min    Problem List:  Patient Active Problem List   Diagnosis Date Noted  . Acute pulmonary edema ([place]) 12/12/2016  . Acute diastolic CHF (congestive heart failure) ([place]) 12/12/2016  . Atrial flutter ([place]) 12/12/2016  . Traumatic subdural bleed with LOC of 1 hour to 5 hours 59 minutes ([place]) 12/11/2016  . Class 1 obesity due to excess calories with body mass index (BMI) of 30.0 to 30.9 in adult   . ETOH abuse   . Chronic obstructive pulmonary disease ([place])   . Supplemental oxygen dependent   . Alcohol use   . OSA on CPAP   . Atrial fibrillation ([place])   . Coronary artery disease involving coronary bypass graft of native heart without angina pectoris   . Seizures ([place])   . Dysphagia   . Bradycardia   . Hyperglycemia   . Agitation   . Hypokalemia   . Hypernatremia   . Leukocytosis   . Macrocytic anemia   . Thrombocytopenia ([place])   . Traumatic subdural hematoma without loss of consciousness ([place])   . Change in mental status   . Subdural hematoma ([place]) 12/03/2016  . Sick sinus syndrome ([place]) 11/06/2015  . PAF (paroxysmal atrial fibrillation) ([place]) 11/14/2014  . SOB (shortness of breath) 10/31/2014  . Persistent atrial fibrillation ([place]) 10/31/2014  . Aortic stenosis, severe 07/28/2011  . 3-vessel coronary artery disease 07/28/2011  . Hyperlipidemia, mixed 07/28/2011    Past Medical History:  Past Medical History:  Diagnosis Date  . Aortic stenosis    s/p AVR by Dr Cyndia Bent  . COPD (chronic obstructive pulmonary disease) ([place])   . History of coronary artery disease    status post stenting of the marginal circumflex in 12/2003 and  again in 2009  . Hyperlipidemia   . Morbid obesity ([facility])    weight 243 pounds, BMI 31.2kg/m2, BSA 2.36 square meters  . Obstructive sleep apnea    compliant with CPAP  . Persistent atrial fibrillation [facility])    Past Surgical History:  Past Surgical History:  Procedure Laterality Date  . AORTIC VALVE REPLACEMENT (AVR)/CORONARY ARTERY BYPASS GRAFTING (CABG)   08/05/2011   LIMA to LAD, sequential saphenous vein graft to third and fourth obtuse marginal branches of the circumflex, aortic valve replacement using a 23 mm Edwards pericardial valve  . APPENDECTOMY    . CARDIOVERSION N/A 11/14/2014   Procedure: CARDIOVERSION;  Surgeon: Candee Furbish, MD;  Location: [facility]COPY;  Service: Cardiovascular;  Laterality: N/A;  . CARDIOVERSION N/A 11/20/2016   Procedure: CARDIOVERSION;  Surgeon: Fay Records, MD;  Location: [place];  Service: Cardiovascular;  Laterality: N/A;  . CAROTID ENDARTERECTOMY     Dr Levi Aland  . EP IMPLANTABLE DEVICE N/A 11/06/2015   Procedure: PPM Generator Changeout;for sick sinus syndrome with a MDT Adapta L PPM, chronically elevated RV threshold.  . permanent pacemaker     MDT EnRhythm implanted by Dr Sherilyn Banker in [place] for complete heart block with syncope  . REPLACEMENT TOTAL KNEE BILATERAL     2006  . TEE WITHOUT CARDIOVERSION N/A 11/14/2014   Procedure: TRANSESOPHAGEAL ECHOCARDIOGRAM (TEE);  Surgeon: Candee Furbish, MD;  Location: [place];  Service: Cardiovascular;  Laterality: N/A;  Assessment & Plan Clinical Impression: Patient is a 83 y.o. year old male with recent admission to the hospital on 12/03/2016 after a recent fall while in the yard and struck his head without loss of consciousness. Develop dry heaving gradual onset of moderate squeezing chest pain shortness of breath. CT of the head showed left parietal scalp hematoma. Acute right hemispheric subdural hematoma with maximal thickness in the right posterior parietal region measuring 15 mm. Right  to left shift of 7.5 mm..  Patient transferred to CIR on 12/11/2016 .   Patient currently requires total with mobility secondary to muscle weakness, decreased cardiorespiratoy endurance and decreased oxygen support, abnormal tone, unbalanced muscle activation, motor apraxia, decreased coordination and decreased motor planning, decreased initiation, decreased attention, decreased awareness, decreased safety awareness and delayed processing and decreased sitting balance, decreased standing balance, decreased postural control, hemiplegia and decreased balance strategies.  Prior to hospitalization, patient was independent  with mobility and lived with Spouse in a House home.  Home access is 3Stairs to enter.  Patient will benefit from skilled PT intervention to maximize safe functional mobility, minimize fall risk and decrease caregiver burden for planned discharge home with 24 hour assist.  Anticipate patient will benefit from follow up [facility] at discharge.  PT - End of Session Activity Tolerance: Tolerates < 10 min activity with changes in vital signs Endurance Deficit: Yes Endurance Deficit Description: increased HR with activity PT Assessment Rehab Potential (ACUTE/IP ONLY): Fair PT Patient demonstrates impairments in the following area(s): Balance;Edema;Endurance;Motor;Safety;Sensory PT Transfers Functional Problem(s): Bed Mobility;Bed to Chair;Car;Furniture PT Locomotion Functional Problem(s): Ambulation;Wheelchair Mobility;Stairs PT Plan PT Intensity: Minimum of 1-2 x/day ,45 to 90 minutes PT Frequency: 5 out of 7 days PT Duration Estimated Length of Stay: 21-28 days PT Treatment/Interventions: Ambulation/gait training;Cognitive remediation/compensation;Discharge planning;DME/adaptive equipment instruction;Functional mobility training;Pain management;Splinting/orthotics;Therapeutic Activities;UE/LE Strength taining/ROM;Balance/vestibular training;Community reintegration;Functional electrical  stimulation;Neuromuscular re-education;Patient/family education;Stair training;Therapeutic Exercise;UE/LE Coordination activities;Wheelchair propulsion/positioning PT Transfers Anticipated Outcome(s): min A PT Locomotion Anticipated Outcome(s): supervision w/c level  Skilled Therapeutic Intervention PT eval limited due to pt with low arousal, unable to keep eyes open during session > 2 seconds.  Pt's wife present and able to give information.  Pt's wife educated on PT POC and goals for rehab.  Pt total +2 assist for squat pivot transfer to bed and for bed mobility. Pt left in bed with restraints applied and bed alarm on.  PT Evaluation Precautions/Restrictions Precautions Precautions: Fall Precaution Comments: monitor HR/ BP; weeping edema Restrictions Weight Bearing Restrictions: No General HR 126 at rest, BP 160/106, spO2 95% Pain Pain Assessment Pain Score: 0-No pain Home Living/Prior Functioning Home Living Available Help at Discharge: Family;Available 24 hours/day Type of Home: House Home Access: Stairs to enter [employer] of Steps: 3 Entrance Stairs-Rails: Right Home Layout: One level Bathroom Shower/Tub: Multimedia programmer: Standard Additional Comments: wife can provide supervision level  Lives With: Spouse Prior Function Level of Independence: Independent with basic ADLs;Independent with gait;Independent with transfers  Able to Take Stairs?: Yes Driving: Yes Vocation: Full time employment Comments: drives, active on property at home  Cognition Overall Cognitive Status: Impaired/Different from baseline Arousal/Alertness: Lethargic Attention: Sustained Sustained Attention: Impaired Sustained Attention Impairment: Verbal basic;Functional basic Memory: Impaired Awareness: Impaired Awareness Impairment: Intellectual impairment;Emergent impairment;Anticipatory impairment Problem Solving: Impaired Problem Solving Impairment: Verbal  basic Executive Function:  (all impaired due to lower level cognition impaired) Safety/Judgment: Impaired Comments: pt very lethargic on PT eval, unable to keep eyes open > 2 seconds Rancho Los Amigos Scales of Cognitive  Functioning: Confused/inappropriate/non-agitated Sensation Sensation Light Touch: Impaired Detail Light Touch Impaired Details: Impaired LLE;Impaired LUE Proprioception: Impaired Detail Proprioception Impaired Details: Impaired LLE;Impaired LUE Coordination Gross Motor Movements are Fluid and Coordinated: No Fine Motor Movements are Fluid and Coordinated: No Coordination and Movement Description: decr coordination left>right Motor  Motor Motor: Hemiplegia;Abnormal postural alignment and control;Motor apraxia;Motor impersistence   Trunk/Postural Assessment  Cervical Assessment Cervical Assessment:  (forward flexed) Thoracic Assessment Thoracic Assessment:  (rounded trunk) Lumbar Assessment Lumbar Assessment:  (posterior pelvic tilt) Postural Control Postural Control: Deficits on evaluation Trunk Control: min to mod A without support Righting Reactions: delayed Protective Responses: delayed  Balance Balance Balance Assessed: Yes Dynamic Sitting Balance Dynamic Sitting - Balance Support: During functional activity Dynamic Sitting - Level of Assistance: 2: Max Insurance risk surveyor Standing - Balance Support: During functional activity Static Standing - Level of Assistance: 2: Max assist;1: +1 Total assist;1: +2 Total assist Extremity Assessment  RUE Assessment RUE Assessment: Within Functional Limits LUE Assessment LUE Assessment: Exceptions to WFL LUE AROM (degrees) Overall AROM Left Upper Extremity:  (0-90) LUE Strength LUE Overall Strength Comments: 2/5 RLE Assessment RLE Assessment:  (unable to assess due to arousal) LLE Assessment LLE Assessment:  (unable to assess due to arousal)   See Function Navigator for Current Functional  Status.   Refer to Care Plan for Long Term Goals  Recommendations for other services: None   Discharge Criteria: Patient will be discharged from PT if patient refuses treatment 3 consecutive times without medical reason, if treatment goals not met, if there is a change in medical status, if patient makes no progress towards goals or if patient is discharged from hospital.  The above assessment, treatment plan, treatment alternatives and goals were discussed and mutually agreed upon: by patient  [facility] 12/12/2016, 2:47 PM

## 2016-12-12 NOTE — Progress Notes (Signed)
Occupational Therapy Note  Patient Details  Name: Alex DuhamelJohn R [name] MRN: [id number] Date of Birth: 1931-03-04  Today's Date: 12/12/2016 OT Missed Time: 30 Minutes Missed Time Reason: Patient fatigue  Pt missed 30 mins skilled OT services.  Pt asleep upon arrival with wife present.  Unable to arouse pt to actively participate in therapy.     Lavone NeriLanier, Oswald Pott [facility] 12/12/2016, 2:41 PM

## 2016-12-12 NOTE — Consult Note (Signed)
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 Minimize environmental distractions;Slow rate;Small sips/bites;Follow solids with liquid;Other (Comment) Postural Changes Seated upright at 90 degrees   CHL IP OTHER RECOMMENDATIONS 12/11/2016 Recommended Consults -- Oral Care Recommendations Oral care BID;Other (Comment) Other Recommendations Have oral suction available   [facility]L IP FOLLOW UP RECOMMENDATIONS 12/11/2016 Follow up Recommendations Inpatient Rehab   [facility] IP FREQUENCY AND DURATION 12/11/2016 Speech Therapy Frequency (ACUTE ONLY) min 2x/week Treatment Duration 2 weeks      [facility]L IP ORAL PHASE 12/11/2016 Oral Phase Impaired Oral - Pudding Teaspoon -- Oral - Pudding Cup -- Oral - Honey Teaspoon -- Oral - Honey Cup -- Oral - Nectar Teaspoon -- Oral - Nectar Cup -- Oral - Nectar Straw -- Oral - Thin Teaspoon Weak lingual manipulation;Reduced posterior propulsion;Holding of bolus;Lingual/palatal residue;Delayed oral transit;Decreased bolus cohesion;Premature spillage Oral - Thin Cup Weak lingual manipulation;Reduced posterior propulsion;Holding of bolus;Lingual/palatal residue;Delayed oral transit;Decreased bolus cohesion;Premature spillage Oral - Thin Straw Weak lingual manipulation;Reduced posterior propulsion;Holding of bolus;Lingual/palatal residue;Delayed oral transit;Decreased bolus cohesion;Piecemeal swallowing;Premature spillage Oral - Puree Weak lingual  manipulation;Reduced posterior propulsion;Holding of bolus;Lingual/palatal residue;Delayed oral transit;Decreased bolus cohesion;Premature spillage Oral - Mech Soft -- Oral - Regular -- Oral - Multi-Consistency -- Oral - Pill -- Oral Phase - Comment --  [facility]L IP PHARYNGEAL PHASE 12/11/2016 Pharyngeal Phase WFL Pharyngeal- Pudding Teaspoon -- Pharyngeal -- Pharyngeal- Pudding Cup -- Pharyngeal -- Pharyngeal- Honey Teaspoon -- Pharyngeal -- Pharyngeal- Honey Cup -- Pharyngeal -- Pharyngeal- Nectar Teaspoon -- Pharyngeal -- Pharyngeal- Nectar Cup -- Pharyngeal -- Pharyngeal- Nectar Straw -- Pharyngeal -- Pharyngeal- Thin Teaspoon -- Pharyngeal -- Pharyngeal- Thin Cup -- Pharyngeal -- Pharyngeal- Thin Straw -- Pharyngeal -- Pharyngeal- Puree -- Pharyngeal -- Pharyngeal- Mechanical Soft -- Pharyngeal -- Pharyngeal- Regular -- Pharyngeal --  Pharyngeal- Multi-consistency -- Pharyngeal -- Pharyngeal- Pill -- Pharyngeal -- Pharyngeal Comment --  CHL IP CERVICAL ESOPHAGEAL PHASE 12/11/2016 Cervical Esophageal Phase WFL Pudding Teaspoon -- Pudding Cup -- Honey Teaspoon -- Honey Cup -- Nectar Teaspoon -- Nectar Cup -- Nectar Straw -- Thin Teaspoon -- Thin Cup -- Thin Straw -- Puree -- Mechanical Soft -- Regular -- Multi-consistency -- Pill -- Cervical Esophageal Comment -- No flowsheet data found. Maxcine Ham 12/11/2016, 10:14 AM  Maxcine Ham, M.A. CCC-SLP [phone]              ASSESSMENT AND PLAN  Principal Problem:   Traumatic subdural bleed with LOC of 1 hour to 5 hours 59 minutes (HCC) Active Problems:   PAF (paroxysmal atrial fibrillation) (HCC)   Chronic obstructive pulmonary disease (HCC)   Coronary artery disease involving coronary bypass graft of native heart without angina pectoris   Acute pulmonary edema (HCC)   1. Traumatic Subdural Hematoma: 2/2 mechanical fall while on anticoagulation therapy with Coumadin. INR at time of admit was 4. Coumadin was discontinued and INR reversed with Vitamin  K. F/u CT scan on 2/14 showed a stable right subdural hematoma with stable mass effect. Now in inpatient rehab.   2. Atrial Fibrillation/Flutter: difficult to control in the past. Failed cardioversion's. Failed drug therapy with sotalol. Pt offered ablation but declined.  2 days prior to admission, he was seen in the afib clinic and started on amiodarone, 200 mg BID. His CHA2DS2 VASc score is 3, however coumadin has been discontinued in the setting of subdural hematoma. Given inability to resume oral anticoagulation, we may need to consider discontinuing amiodarone to prevent chemical conversion. Recommend rate control management only. This may require further titration of his metoprolol in order to achieve adequate rate control. Assume is increased HR overnight was secondary to volume overload/ acute pulmonary edema. His rate should hopefully continue to improve with diuresis.   3. CAD: s/p CABG in 2012. LHC in 2014 showed multivessel CAD with mild to moderate nonobstructive RCA stenosis, severe LAD and left circumflex stenoses, and continued patency of the LIMA to LAD graft as well as the sequential vein graft to the obtuse marginal branches. Continue medical therapy. On BB and atorvastatin.   4. Aortic Valve Disease: s/p AVR per Dr. Laneta Simmers in 2012 at time of CABG. Luckily this was a tissue valve, thus no indication for anticoagulation.   5. Acute Diastolic CHF: presumed diastolic exacerbation. His last echo was in 2016 and showed normal LVEF at that time. Pt had increased WOB yesterday with STAT CXR showing acute pulmonary edema. This was in the setting of his PO diuretic being on hold. He was given IV lasix with improvement in condition and scheduled lasix has been reordered, 20 mg PO daily. Suspect his volume overload was driving his increased HR. Continue lasix and beta blocker. Monitor volume status closely. He has a foley. Monitor and record urine output and check weight daily.   6. HTN: BP is  elevated. Recommend further titration of his metoprolol, which will also help with rate control of his atrial fibrillation.   7. SSS: Has PPM followed by Dr. Johney Frame.   MD to assess and will provider further recommendations.    Signed, Robbie Lis, PA-C 12/12/2016, 9:36 AM Patient seen and examined and history reviewed. Agree with above findings and plan. 83 yo WM with complex medical history as noted above. Transferred to inpatient Rehab this week following a traumatic SDH. Noted yesterday to have increased work of  breathing. CXR showed pulmonary edema. Given IV lasix with excellent response. Now patient unable to voice complaints. Minimally responsive.  On exam he is tachycardic with HR 120. BP is elevated. Breathing is unlabored and lungs are clear anteriorly. IRR with mild systolic ejection murmur. Minimal edema. Of note weight is up 20 lbs compared to 12/01/16.  Ecg reviewed and shows an atypical flutter with RVR rate 130.  Impression:  1. Acute diastolic CHF. EF normal by last Echo in 2016. May consider repeating but I don't think this would be very helpful now with his elevated HR. Results would not appreciably alter treatment. Recommend resume oral lasix 40 mg daily. Follow BMET. Replete potassium to >4. Appears to have a excellent response to IV lasix. Monitor weight. We should be able to manage on Rehab floor. 2. Persistent atypical flutter with RVR. Recently started on amiodarone with goal to restore NSR. Failed prior sotalol and DCCV. Since he is unable to be anticoagulated at this point I would stop amiodarone since we will not be able to restore NSR any time soon. Will increase metoprolol given poor HR and BP control 3. HTN uncontrolled. Will see how he responds to increase metoprolol and resumption of lasix.  4. S/p traumatic SDH. Anticoagulants on hold. 5. SSS 6. S/p bioprosthetic AVR and CABG.   Clearence Vitug [place], MDFACC 12/12/2016 12:35 PM

## 2016-12-12 NOTE — Progress Notes (Signed)
Received report of pt having difficulty breathing during the night, found upon assessment to have bil rhonchi, exp wheezes, labored breathing, tachycardia, arousable, answers questions however having hard time staying still, restless, coorperative, mittens removed. Pa notified of status and new orders received for IV lasix, ckmb, troponin level and EKG. EKG results shared with Dr. Riley KillSwartz. Pamelia HoitSharp, Levester Waldridge B

## 2016-12-12 NOTE — Progress Notes (Signed)
Pt given 20 mg lasix IV and 1 mg Ativan IV.
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
## 2016-12-12 NOTE — Significant Event (Signed)
Rapid Response Event Note RN called for rhonchus, wheezing, increase WOB Overview: Time Called: 2230 Arrival Time: 2235 Event Type: Respiratory  Initial Focused Assessment: On arrival pt lying supine in bed, skin warm, clammy, pale. Bilateral lungs rhonchus throughout with expiratory wheezing, pt alert to self only, per RN this different from his norm but consistent with this admission. RN also reports 7 lb weight gain in the last two days. RN made aware in report pt had "coughed up" his feeding tube prior to transfer. [name] paged PTA, new order for 2 view CXR which resulted in pulmonary edema with small to moderate posterior pleural effusion. BP 170/98, HR 92, RR 24, 100% 2L [place], rectal temp 100.4.   Interventions: Alex Okaan PA gave new orders for Lasix 20 mg IVP, UA with culture. RN gave Tylenol PR 650 mg, last temp 97.9 oral Plan of Care (if not transferred): Continue to monitor pt, call RRT as needed, Alex CelesteJoanna RN aware of plan Event Summary: Name of Physician Notified: [name] at  ( PTA RRT  )    at    Outcome: Stayed in room and stabalized     [name], Alex [name]

## 2016-12-12 NOTE — Evaluation (Addendum)
Speech Language Pathology Assessment and Plan  Patient Details  Name: Alex [name] MRN: [id number] Date of Birth: [birth year]  SLP Diagnosis: Aphasia;Dysarthria;Cognitive Impairments;Dysphagia  Rehab Potential: Good ELOS: 21-28 days     Today's Date: 12/12/2016 SLP Individual Time: 1000-1100 SLP Individual Time Calculation (min): 60 min   Problem List:  Patient Active Problem List   Diagnosis Date Noted  . Acute pulmonary edema ([place]) 12/12/2016  . Acute diastolic CHF (congestive heart failure) ([place]) 12/12/2016  . Atrial flutter ([place]) 12/12/2016  . Traumatic subdural bleed with LOC of 1 hour to 5 hours 59 minutes ([place]) 12/11/2016  . Class 1 obesity due to excess calories with body mass index (BMI) of 30.0 to 30.9 in adult   . ETOH abuse   . Chronic obstructive pulmonary disease ([place])   . Supplemental oxygen dependent   . Alcohol use   . OSA on CPAP   . Atrial fibrillation ([place])   . Coronary artery disease involving coronary bypass graft of native heart without angina pectoris   . Seizures ([place])   . Dysphagia   . Bradycardia   . Hyperglycemia   . Agitation   . Hypokalemia   . Hypernatremia   . Leukocytosis   . Macrocytic anemia   . Thrombocytopenia ([place])   . Traumatic subdural hematoma without loss of consciousness ([place])   . Change in mental status   . Subdural hematoma ([place]) 12/03/2016  . Sick sinus syndrome ([place]) 11/06/2015  . PAF (paroxysmal atrial fibrillation) ([place]) 11/14/2014  . SOB (shortness of breath) 10/31/2014  . Persistent atrial fibrillation ([place]) 10/31/2014  . Aortic stenosis, severe 07/28/2011  . 3-vessel coronary artery disease 07/28/2011  . Hyperlipidemia, mixed 07/28/2011   Past Medical History:  Past Medical History:  Diagnosis Date  . Aortic stenosis    s/p AVR by Dr Cyndia Bent  . COPD (chronic obstructive pulmonary disease) ([place])   . History of coronary artery disease    status post stenting of the marginal circumflex in 12/2003 and again in 2009   . Hyperlipidemia   . Morbid obesity ([facility])    weight 243 pounds, BMI 31.2kg/m2, BSA 2.36 square meters  . Obstructive sleep apnea    compliant with CPAP  . Persistent atrial fibrillation [facility])    Past Surgical History:  Past Surgical History:  Procedure Laterality Date  . AORTIC VALVE REPLACEMENT (AVR)/CORONARY ARTERY BYPASS GRAFTING (CABG)   08/05/2011   LIMA to LAD, sequential saphenous vein graft to third and fourth obtuse marginal branches of the circumflex, aortic valve replacement using a 23 mm Edwards pericardial valve  . APPENDECTOMY    . CARDIOVERSION N/A 11/14/2014   Procedure: CARDIOVERSION;  Surgeon: Candee Furbish, MD;  Location: [facility] ENDOSCOPY;  Service: Cardiovascular;  Laterality: N/A;  . CARDIOVERSION N/A 11/20/2016   Procedure: CARDIOVERSION;  Surgeon: Fay Records, MD;  Location: [place];  Service: Cardiovascular;  Laterality: N/A;  . CAROTID ENDARTERECTOMY     Dr Levi Aland  . EP IMPLANTABLE DEVICE N/A 11/06/2015   Procedure: PPM Generator Changeout;for sick sinus syndrome with a MDT Adapta L PPM, chronically elevated RV threshold.  . permanent pacemaker     MDT EnRhythm implanted by Dr Sherilyn Banker in [place] for complete heart block with syncope  . REPLACEMENT TOTAL KNEE BILATERAL     2006  . TEE WITHOUT CARDIOVERSION N/A 11/14/2014   Procedure: TRANSESOPHAGEAL ECHOCARDIOGRAM (TEE);  Surgeon: Candee Furbish, MD;  [facility] END[facility]COPY;  Service: Cardiovascular;  Laterality: N/A;    Assessment /  Plan / Recommendation Clinical Impression Patient is a 83 y.o.right handed malewith history of morbid obesity, alcohol use, COPD with nighttime oxygen, obstructive sleep apnea with CPAP, atrial fibrillation/AVR/CABG/PPM maintained on aspirin and Coumadin. Per chart review and wife, patient lives with spouse independent prior to admission. One level home with 3 steps to entry. Presented 12/03/2016 after a recent fall while in the yard and struck his head without loss of  consciousness. Develop dry heaving gradual onset of moderate squeezing chest pain shortness of breath. CT of the head showed left parietal scalp hematoma. Acute right hemispheric subdural hematoma with maximal thickness in the right posterior parietal region measuring 15 mm. Right to left shift of 7.5 mm. Troponin negative. INR of 4.14 that was reversed. Neurosurgery Dr. Sherley Bounds advised conservative care. Hospital course confusion with delirium he had been started on Precedex. Soft restraints were added for patient safety. Reported grand mal seizure 12/04/2016 required intubation and currently maintained on Keppra. EEG showed occasional low amplitude epileptiform discharges over the right posterior temporal region 3 clinicoelectrographic seizures captured arising from the right hemisphere lasting 2-4 minutes. Patient remains off anticoagulation due to subdural hematoma. Patient initially NPOwith nasogastric tube for nutritional support however patient did pull his nasogastric tube out. A swallow study 12/11/2016 placed on a full liquid diet.. Latest cranial CT scan 12/10/2016 reviewed, showingstable right subdural hematoma with stable mass effect. Per report, no new large territory infarct or parenchymal hemorrhage identified. Physical andoccupational therapy evaluations completed with recommendations of physical medicine rehabilitation consult. Patient was admitted for a comprehensive rehabilitation program on 12/11/16.  Patient demonstrates behaviors consistent with a [place] Level V and requires overall total A to complete functional and familiar tasks safely in regards to initiation, sustained attention, functional problem solving, recall of new information, attention to left field of environment and intellectual awareness. Patient also demonstrates restlessness. Patient administered a limited BSE due to fatigue and declined trials of Dys. 1 textures. However, patient consumed thin liquids via cup without  overt s/s of aspiration but demonstrated intermittent oral holding and anterior spillage. Patient was unable to use the straw today due to increased work of breathing (MD aware) with inability to form efficient labial seal and coordination. Recommend patient continue current diet with full supervision to maximize safety. Patient also demonstrated receptive and expressive language deficits which impacts his overall functional communication and basic auditory comprehension. Patient would benefit from skilled SLP intervention to maximize his cognitive-linguistic and swallowing function in order to maximize his overall functional independence prior to discharge.    Skilled Therapeutic Interventions          Administered a cognitive-linguistic evaluation and BSE. Please see above for details.   SLP Assessment  Patient will need skilled Speech Lanaguage Pathology Services during CIR admission    Recommendations  SLP Diet Recommendations:  (full liquid diet ) Liquid Administration via: Cup;Straw Medication Administration: Crushed with puree Supervision: Full supervision/cueing for compensatory strategies;Staff to assist with self feeding;Patient able to self feed Compensations: Minimize environmental distractions;Slow rate;Small sips/bites;Follow solids with liquid Postural Changes and/or Swallow Maneuvers: Seated upright 90 degrees Oral Care Recommendations: Oral care BID Recommendations for Other Services: Neuropsych consult Patient destination: Home Follow up Recommendations: Home Health SLP;24 hour supervision/assistance Equipment Recommended: To be determined    SLP Frequency 3 to 5 out of 7 days   SLP Duration  SLP Intensity  SLP Treatment/Interventions 21-28 days   Minumum of 1-2 x/day, 30 to 90 minutes  Cognitive remediation/compensation;Cueing hierarchy;Dysphagia/aspiration precaution training;Environmental controls;Functional  tasks;Internal/external aids;Patient/family  education;Speech/Language facilitation;Therapeutic Activities    Pain Pain Assessment Pain Score: 0-No pain  Prior Functioning Type of Home: House  Lives With: Spouse Available Help at Discharge: Family;Available 24 hours/day Vocation: Full time employment  Function:  Eating Eating   Modified Consistency Diet: Yes Eating Assist Level: Supervision or verbal cues;Hand over hand assist;Help managing cup/glass           Cognition Comprehension Comprehension assist level: Understands basic 75 - 89% of the time/ requires cueing 10 - 24% of the time (Simultaneous filing. User may not have seen previous data.)  Expression Expression assistive device: Communication board Expression assist level: Expresses basic 25 - 49% of the time/requires cueing 50 - 75% of the time. Uses single words/gestures. (Simultaneous filing. User may not have seen previous data.)  Social Interaction Social Interaction assist level: Interacts appropriately 75 - 89% of the time - Needs redirection for appropriate language or to initiate interaction. (Simultaneous filing. User may not have seen previous data.)  Problem Solving Problem solving assist level: Solves basic 25 - 49% of the time - needs direction more than half the time to initiate, plan or complete simple activities (Simultaneous filing. User may not have seen previous data.)  Memory Memory assist level: Recognizes or recalls 25 - 49% of the time/requires cueing 50 - 75% of the time (Simultaneous filing. User may not have seen previous data.)   Short Term Goals: Week 1: SLP Short Term Goal 1 (Week 1): Patient will consume current diet with minimal overt s/s of aspiration with Min A verbal cues needed to reduce oral holding.  SLP Short Term Goal 2 (Week 1): Patient will name functional items with Max A multimodal cues.  SLP Short Term Goal 3 (Week 1): Patient will identify objects from a field of 2 with 90% accuracy and Min A verbal cues.  SLP Short Term  Goal 4 (Week 1): Patient will answer basic yes/no question in regards to wants/needs and biographical information with 75% accuracy and Min A verbal cues.  SLP Short Term Goal 5 (Week 1): Patient will demonstrate sustained attention to a task for 2 minutes with Max A verbal cues for redirection.   Refer to Care Plan for Long Term Goals  Recommendations for other services: Neuropsych  Discharge Criteria: Patient will be discharged from SLP if patient refuses treatment 3 consecutive times without medical reason, if treatment goals not met, if there is a change in medical status, if patient makes no progress towards goals or if patient is discharged from hospital.  The above assessment, treatment plan, treatment alternatives and goals were discussed and mutually agreed upon: by patient and by family  Lesbia Ottaway 12/12/2016, 3:59 PM

## 2016-12-12 NOTE — Progress Notes (Signed)
Occupational Therapy Session Note  Patient Details  Name: Alex DuhamelJohn R [name] MRN: [id number] Date of Birth: [birth year]  Today's Date: 12/12/2016 OT Missed Time: 60 Minutes Missed Time Reason: Patient fatigue   Skilled Therapeutic Interventions/Progress Updates:  Pt was asleep with spouse and friends present at time of arrival. 60 minutes missed due to pt unable to be aroused to actively participate in tx.   Therapy Documentation Precautions:  Precautions Precautions: Fall Precaution Comments: monitor HR/ BP; weeping edema Restrictions Weight Bearing Restrictions: No General: General Chart Reviewed: Yes OT Amount of Missed Time: 60 Minutes Family/Caregiver Present: Yes (wife) Vital Signs: Therapy Vitals Temp: 98.4 F (36.9 C) Temp Source: Oral Pulse Rate: 84 Resp: (!) 24 BP: 136/68 Patient Position (if appropriate): Lying Oxygen Therapy SpO2: 95 % O2 Device: Nasal Cannula O2 Flow Rate (L/min): 2 L/min Pain: Pain Assessment Pain Score: 0-No pain ADL:      See Function Navigator for Current Functional Status.   Therapy/Group: Individual Therapy  Jaison Petraglia A Arhianna Ebey 12/12/2016, 4:12 PM

## 2016-12-12 NOTE — Progress Notes (Signed)
Initial Nutrition Assessment  DOCUMENTATION CODES:   Obesity unspecified  INTERVENTION:  Provide Ensure Enlive po TID, each supplement provides 350 kcal and 20 grams of protein.  Provide 30 ml Prostat po BID, each supplement provides 100 kcal and 15 grams of protein.   Encourage adequate PO intake.   NUTRITION DIAGNOSIS:   Increased nutrient needs related to chronic illness as evidenced by estimated needs.  GOAL:   Patient will meet greater than or equal to 90% of their needs  MONITOR:   PO intake, Supplement acceptance, Diet advancement, Labs, Weight trends, Skin, I & O's  REASON FOR ASSESSMENT:   Consult Assessment of nutrition requirement/status  ASSESSMENT:   83 y.o. right handed male with history alcohol use, COPD with nighttime oxygen, obstructive sleep apnea with CPAP, atrial fibrillation/AVR/CABG/PPM maintained on aspirin and Coumadin. Presented 12/03/2016 after a recent fall while in the yard and struck his head without loss of consciousness. CT of the head showed left parietal scalp hematoma. Acute right hemispheric subdural hematoma with maximal thickness in the right posterior parietal region measuring 15 mm. Patient initially NPO with nasogastric tube for nutritional support however patient did pull his nasogastric tube out. A swallow study 12/11/2016 placed on a full liquid diet.. Latest cranial CT scan 12/10/2016 reviewed, showing stable right subdural hematoma with stable mass effect.   Noted RD consulted for enteral/tube feeding initiation and management. NGT pulled out 2/14. Discussed case with RN discussed pt need for nutrition assessment and nutritional supplements. Meal completion 25% yesterday, no recent meal completion recorded. Pt was unable to respond to questions asked. Wife at bedside reports pt was eating well PTA with no other difficulties. Pt with no weight loss but instead weight gain since 2/5. Noted pt is on lasix now. RD to order nutritional  supplements to aid in adequate nutrition. Pt with no observed significant fat or muscle mass loss.   Labs and medications reviewed. Sodium elevated at 147.   Diet Order:  Diet full liquid Room service appropriate? Yes; Fluid consistency: Thin  Skin:  Reviewed, no issues  Last BM:  Unknown  Height:   Ht Readings from Last 1 Encounters:  12/11/16 6\' 2"  (1.88 m)    Weight:   Wt Readings from Last 1 Encounters:  12/12/16 253 lb 1.4 oz (114.8 kg)    Ideal Body Weight:  86.36 kg  BMI:  Body mass index is 32.49 kg/m.  Estimated Nutritional Needs:   Kcal:  2100-2300  Protein:  110-130 grams  Fluid:  Per MD  EDUCATION NEEDS:   No education needs identified at this time  Roslyn SmilingStephanie Lyndell Gillyard, MS, RD, LDN Pager # [phone] After hours/ weekend pager # [phone]

## 2016-12-12 NOTE — Evaluation (Signed)
Occupational Therapy Assessment and Plan  Patient Details  Name: Alex [name] MRN: [id number] Date of Birth: [birth year]  OT Diagnosis: abnormal posture, apraxia, cognitive deficits, hemiplegia affecting non-dominant side and muscle weakness (generalized) Rehab Potential: Rehab Potential (ACUTE ONLY): Good ELOS: ~21-28 days   Today's Date: 12/12/2016 OT Individual Time: 0900-1000 OT Individual Time Calculation (min): 60 min     Problem List:  Patient Active Problem List   Diagnosis Date Noted  . Acute pulmonary edema ([place]) 12/12/2016  . Acute diastolic CHF (congestive heart failure) ([place]) 12/12/2016  . Atrial flutter ([place]) 12/12/2016  . Traumatic subdural bleed with LOC of 1 hour to 5 hours 59 minutes ([place]) 12/11/2016  . Class 1 obesity due to excess calories with body mass index (BMI) of 30.0 to 30.9 in adult   . ETOH abuse   . Chronic obstructive pulmonary disease ([place])   . Supplemental oxygen dependent   . Alcohol use   . OSA on CPAP   . Atrial fibrillation ([place])   . Coronary artery disease involving coronary bypass graft of native heart without angina pectoris   . Seizures ([place])   . Dysphagia   . Bradycardia   . Hyperglycemia   . Agitation   . Hypokalemia   . Hypernatremia   . Leukocytosis   . Macrocytic anemia   . Thrombocytopenia ([place])   . Traumatic subdural hematoma without loss of consciousness ([place])   . Change in mental status   . Subdural hematoma ([place]) 12/03/2016  . Sick sinus syndrome ([place]) 11/06/2015  . PAF (paroxysmal atrial fibrillation) ([place]) 11/14/2014  . SOB (shortness of breath) 10/31/2014  . Persistent atrial fibrillation ([place]) 10/31/2014  . Aortic stenosis, severe 07/28/2011  . 3-vessel coronary artery disease 07/28/2011  . Hyperlipidemia, mixed 07/28/2011    Past Medical History:  Past Medical History:  Diagnosis Date  . Aortic stenosis    s/p AVR by Dr Cyndia Bent  . COPD (chronic obstructive pulmonary disease) ([place])   . History of coronary  artery disease    status post stenting of the marginal circumflex in 12/2003 and again in 2009  . Hyperlipidemia   . Morbid obesity ([facility])    weight 243 pounds, BMI 31.2kg/m2, BSA 2.36 square meters  . Obstructive sleep apnea    compliant with CPAP  . Persistent atrial fibrillation [facility])    Past Surgical History:  Past Surgical History:  Procedure Laterality Date  . AORTIC VALVE REPLACEMENT (AVR)/CORONARY ARTERY BYPASS GRAFTING (CABG)   08/05/2011   LIMA to LAD, sequential saphenous vein graft to third and fourth obtuse marginal branches of the circumflex, aortic valve replacement using a 23 mm Edwards pericardial valve  . APPENDECTOMY    . CARDIOVERSION N/A 11/14/2014   Procedure: CARDIOVERSION;  Surgeon: Candee Furbish, MD;  Location: [facility] ENDOSCOPY;  Service: Cardiovascular;  Laterality: N/A;  . CARDIOVERSION N/A 11/20/2016   Procedure: CARDIOVERSION;  Surgeon: Fay Records, MD;  Location: [place];  Service: Cardiovascular;  Laterality: N/A;  . CAROTID ENDARTERECTOMY     Dr Levi Aland  . EP IMPLANTABLE DEVICE N/A 11/06/2015   Procedure: PPM Generator Changeout;for sick sinus syndrome with a MDT Adapta L PPM, chronically elevated RV threshold.  . permanent pacemaker     MDT EnRhythm implanted by Dr Sherilyn Banker in [place] for complete heart block with syncope  . REPLACEMENT TOTAL KNEE BILATERAL     2006  . TEE WITHOUT CARDIOVERSION N/A 11/14/2014   Procedure: TRANSESOPHAGEAL ECHOCARDIOGRAM (TEE);  Surgeon: Candee Furbish, MD;  Location: [facility] ENDOSCOPY;  Service: Cardiovascular;  Laterality: N/A;    Assessment & Plan Clinical Impression: Patient is a 83 y.o. year old male right handed malewith history of morbid obesity, alcohol use, COPD with nighttime oxygen, obstructive sleep apnea with CPAP, atrial fibrillation/AVR/CABG/PPM maintained on aspirin and Coumadin. Per chart review and wife, patient lives with spouse independent prior to admission. One level home with 3 steps to entry. Presented  12/03/2016 after a recent fall while in the yard and struck his head without loss of consciousness. Develop dry heaving gradual onset of moderate squeezing chest pain shortness of breath. CT of the head showed left parietal scalp hematoma. Acute right hemispheric subdural hematoma with maximal thickness in the right posterior parietal region measuring 15 mm. Right to left shift of 7.5 mm. Troponin negative. INR of 4.14 that was reversed. Neurosurgery Dr. Sherley Bounds advised conservative care. Hospital course confusion with delirium he had been started on Precedex. Soft restraints were added for patient safety. Reported grand mal seizure 12/04/2016 required intubation and currently maintained on Keppra. EEG showed occasional low amplitude epileptiform discharges over the right posterior temporal region 3 clinicoelectrographic seizures captured arising from the right hemisphere lasting 2-4 minutes. Patient remains off anticoagulation due to subdural hematoma. Patient initially NPOwith nasogastric tube for nutritional support however patient did pull his nasogastric tube out. A swallow study 12/11/2016 placed on a full liquid diet.. Latest cranial CT scan 12/10/2016 reviewed, showingstable right subdural hematoma with stable mass effect. Per report, no new large territory infarct or parenchymal hemorrhage identified Patient transferred to CIR on 12/11/2016 .    Patient currently requires max with basic self-care skills and total A +2 basic mobilty secondary to muscle weakness, decreased cardiorespiratoy endurance, impaired timing and sequencing, unbalanced muscle activation, motor apraxia, decreased coordination and decreased motor planning, decreased midline orientation, decreased attention to left and decreased motor planning, decreased initiation, decreased attention, decreased awareness, decreased problem solving, decreased safety awareness, decreased memory and delayed processing and decreased sitting balance,  decreased standing balance, decreased postural control, hemiplegia, decreased balance strategies and difficulty maintaining precautions.  Prior to hospitalization, patient could complete  with independent .  Patient will benefit from skilled intervention to decrease level of assist with basic self-care skills and increase independence with basic self-care skills prior to discharge home with care partner.  Anticipate patient will require minimal physical assistance and follow up home health and follow up outpatient.  OT - End of Session Activity Tolerance: Tolerates < 10 min activity with changes in vital signs Endurance Deficit: Yes OT Assessment Rehab Potential (ACUTE ONLY): Good Barriers to Discharge: Decreased caregiver support OT Patient demonstrates impairments in the following area(s): Balance;Safety;Behavior;Cognition;Edema;Endurance;Motor;Nutrition;Pain;Perception;Sensory;Skin Integrity OT Basic ADL's Functional Problem(s): Eating;Grooming;Bathing;Dressing;Toileting OT Transfers Functional Problem(s): Toilet;Tub/Shower OT Additional Impairment(s): Fuctional Use of Upper Extremity OT Plan OT Intensity: Minimum of 1-2 x/day, 45 to 90 minutes OT Frequency: 5 out of 7 days OT Duration/Estimated Length of Stay: ~21-28 days OT Treatment/Interventions: Balance/vestibular training;Discharge planning;Functional electrical stimulation;Pain management;Self Care/advanced ADL retraining;Therapeutic Activities;UE/LE Coordination activities;Visual/perceptual remediation/compensation;Therapeutic Exercise;Skin care/wound managment;Patient/family education;Functional mobility training;Disease mangement/prevention;Cognitive remediation/compensation;Community reintegration;DME/adaptive equipment instruction;Neuromuscular re-education;Psychosocial support;UE/LE Strength taining/ROM;Wheelchair propulsion/positioning OT Self Feeding Anticipated Outcome(s): setup OT Basic Self-Care Anticipated Outcome(s):  overall min A OT Toileting Anticipated Outcome(s): min A  OT Bathroom Transfers Anticipated Outcome(s): min A  OT Recommendation Recommendations for Other Services: Neuropsych consult Patient destination: Home Follow Up Recommendations: Home health OT;Outpatient OT Equipment Recommended: To be determined Equipment Details: probaly will need shower and toilet equipment  Skilled Therapeutic Intervention 1:1 Ot eval initiated with OT purpose, role and goals with pt and wife. Self care retraining including bed mobility, bed<>w/c transfer and dressing sit to stand. Pt with elevated HR at 132 at rest with labored breathing and increased respiration. Pt required max to total A to come to EOB and mod A to maintain static balance. Pt able to come into standing from elevated surface with max. Pt unable to pivot to the right with total A - required second person to A to help with pivot to the tilt in space.  Engaged with dressing with total A for LB dressing. Tried to perform sit to stands with STEDY but unable to come into fully erect position. During grooming tasks pt demonstrated apraxia with use of tools and orally. Left in tilt in space w/c with belt around thighs to decr abduction. Left with wife.     OT Evaluation Precautions/Restrictions  Precautions Precautions: Fall Precaution Comments: monitor HR/ BP; weeping edema Restrictions Weight Bearing Restrictions: No General Chart Reviewed: Yes Family/Caregiver Present: Yes (wife) Vital Signs Therapy Vitals Resp: 20 Oxygen Therapy SpO2: 93 % O2 Device: Nasal Cannula O2 Flow Rate (L/min): 2 L/min Pain  no c/o  Home Living/Prior Functioning Home Living Available Help at Discharge: Family, Available 24 hours/day Type of Home: House Home Access: Stairs to enter Technical brewer of Steps: 3 Entrance Stairs-Rails: Right Home Layout: One level Bathroom Shower/Tub: Multimedia programmer: Standard  Lives With: Spouse ADL   see functional navigator Vision/Perception  Vision- History Baseline Vision/History: No visual deficits Vision- Assessment Vision Assessment?: Vision impaired- to be further tested in functional context  Cognition Overall Cognitive Status: Impaired/Different from baseline Arousal/Alertness: Awake/alert Orientation Level: Person;Place Year: 2018 Month:  (didnt come up with an answer) Day of Week: Incorrect (Monday) Memory: Impaired Attention: Sustained Sustained Attention: Impaired Sustained Attention Impairment: Verbal basic;Functional basic Awareness: Impaired Awareness Impairment: Intellectual impairment;Emergent impairment;Anticipatory impairment Problem Solving: Impaired Problem Solving Impairment: Verbal basic Executive Function:  (all impaired due to lower level cognition impaired) Safety/Judgment: Impaired [employer] Scales of Cognitive Functioning: Confused/inappropriate/non-agitated Sensation Sensation Light Touch: Impaired Detail Light Touch Impaired Details: Impaired LLE;Impaired LUE Proprioception: Impaired Detail Proprioception Impaired Details: Impaired LLE;Impaired LUE Coordination Gross Motor Movements are Fluid and Coordinated: No Fine Motor Movements are Fluid and Coordinated: No Coordination and Movement Description: decr coordination left>right Motor  Motor Motor: Hemiplegia;Abnormal postural alignment and control;Motor apraxia;Motor impersistence Mobility  Transfers Transfers: Sit to Stand;Stand to Sit Sit to Stand: From chair/3-in-1;1: +1 Total assist Stand to Sit: To chair/3-in-1;1: +1 Total assist  Trunk/Postural Assessment  Cervical Assessment Cervical Assessment:  (forwARD flexed) Thoracic Assessment Thoracic Assessment:  (rounded trunk) Lumbar Assessment Lumbar Assessment:  (posterior pelvic tilt; abdomen very swollen) Postural Control Postural Control: Deficits on evaluation Trunk Control: min to mod A without support Righting  Reactions: delayed Protective Responses: delayed  Balance Balance Balance Assessed: Yes Dynamic Sitting Balance Dynamic Sitting - Balance Support: During functional activity Dynamic Sitting - Level of Assistance: 4: Min assist;3: Mod assist Static Standing Balance Static Standing - Balance Support: During functional activity Static Standing - Level of Assistance: 2: Max assist;1: +1 Total assist;1: +2 Total assist Extremity/Trunk Assessment RUE Assessment RUE Assessment: Within Functional Limits LUE Assessment LUE Assessment: Exceptions to WFL LUE AROM (degrees) Overall AROM Left Upper Extremity:  (0-90) LUE Strength LUE Overall Strength Comments: 2/5   See Function Navigator for Current Functional Status.   Refer to Care Plan for Long Term Goals  Recommendations  for other services: Neuropsych   Discharge Criteria: Patient will be discharged from OT if patient refuses treatment 3 consecutive times without medical reason, if treatment goals not met, if there is a change in medical status, if patient makes no progress towards goals or if patient is discharged from hospital.  The above assessment, treatment plan, treatment alternatives and goals were discussed and mutually agreed upon: by patient and by family  Nicoletta Ba 12/12/2016, 1:10 PM
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
## 2016-12-12 NOTE — Progress Notes (Signed)
End of shift summary, Pt resp no longer labored but continue to be increased rate at 24-26/min.  O2 sats remain above 90 on 2 L [place].  BP remains elevated, PA Dan here and looked in on pt approx 0500.  Labs drawn and Dan,PA states "are pt's usual" no new orders, report to Chana Bodeeborah Sharp, Rn, informed of pt's lack of bm since admit and of pt's cardio/respiratory status throughout night.

## 2016-12-12 NOTE — Progress Notes (Addendum)
CRITICAL VALUE ALERT  Critical value received:  Troponin 0.04  Date of notification:  12/12/16  Time of notification:  1330  Critical value read back:Yes.    Nurse who received alert:  Chana Bodeeborah Lindamarie Maclachlan  MD notified (1st page):  Jesusita Okaan Finland[place]  Time of first page:  1330  MD notified (2nd page):  Time of second page:  Responding MD:  Dan [place]  Time MD responded:  1330 Ok will notify cardiolgy.

## 2016-12-13 ENCOUNTER — Inpatient Hospital Stay (HOSPITAL_COMMUNITY): Payer: Medicare Other

## 2016-12-13 ENCOUNTER — Inpatient Hospital Stay (HOSPITAL_COMMUNITY): Payer: Medicare Other | Admitting: Speech Pathology

## 2016-12-13 ENCOUNTER — Inpatient Hospital Stay (HOSPITAL_COMMUNITY): Payer: Medicare Other | Admitting: Physical Therapy

## 2016-12-13 DIAGNOSIS — J42 Unspecified chronic bronchitis: Secondary | ICD-10-CM

## 2016-12-13 DIAGNOSIS — I5031 Acute diastolic (congestive) heart failure: Secondary | ICD-10-CM

## 2016-12-13 DIAGNOSIS — I4892 Unspecified atrial flutter: Secondary | ICD-10-CM

## 2016-12-13 LAB — URINE CULTURE

## 2016-12-13 NOTE — Progress Notes (Signed)
Patient calm during the night and resting without respiratory distress.  O2 sats 96% on 2L via .  No attempts made to exit bed.  Arouses to name.  Oriented to person, place, and situation.  Disoriented to time.  Soft waist belt removed when restraint order timed out for trial period.  Patient did excellent without restraint in place during the night.    Kelli HopeBarber, Lima Chillemi M

## 2016-12-13 NOTE — Progress Notes (Signed)
Occupational Therapy Session Note  Patient Details  Name: Alex DuhamelJohn R Kishimoto MRN: [id number] Date of Birth: 12/07/1930  Today's Date: 12/13/2016 OT Individual Time: 1045-1150 OT Individual Time Calculation (min): 65 min   Short Term Goals: Week 1:  OT Short Term Goal 1 (Week 1): Pt will perform stand pivot transfer with mod A +1 to BSC/ toilet OT Short Term Goal 2 (Week 1): Pt will perform sit to stand for clothing management with  mod A  OT Short Term Goal 3 (Week 1): Pt will perform bed mobility in prep for ADL task with mod A with bed rails and extra time OT Short Term Goal 4 (Week 1): Pt will be oriented x4 with mod external cues  Skilled Therapeutic Interventions/Progress Updates: ADL-retraining seated in tilt-in-space w/c with emphasis on assisted grooming, attention, and orientation.   Per physical therapist, pt was exhausted from prolonged bowel movement while sitting on BSC during prior session and might refuse treatment.   Upon entering room, pt was alert and able to follow cues during assisted shaving as needed to turn his head, lift his arms, weight-shift and participate in pt/family ed discussion relating to discharge planning.   Pt was prompted to attempt sit<>stand at sink but decline and also deferred returning to bed.   Therapy session ended early due to pt fatigue.        Therapy Documentation Precautions:  Precautions Precautions: Fall Precaution Comments: monitor HR/ BP; weeping edema Restrictions Weight Bearing Restrictions: No   General: General OT Amount of Missed Time: 10 Minutes (fatigue)   Vital Signs: Therapy Vitals Pulse Rate: 95 BP: 110/80   Pain: No/denies pain  See Function Navigator for Current Functional Status.   Therapy/Group: Individual Therapy  Stclair Szymborski 12/13/2016, 12:58 PM

## 2016-12-13 NOTE — Progress Notes (Signed)
Speech Language Pathology Daily Session Note  Patient Details  Name: Alex DuhamelJohn R [name] MRN: [id number] Date of Birth: 16-Dec-1930  Today's Date: 12/13/2016 SLP Individual Time: 1400-1430 SLP Individual Time Calculation (min): 30 min  Missed Time: 15 minutes due to fatigue, attempted to see patient X 2  Short Term Goals: Week 1: SLP Short Term Goal 1 (Week 1): Patient will consume current diet with minimal overt s/s of aspiration with Min A verbal cues needed to reduce oral holding.  SLP Short Term Goal 2 (Week 1): Patient will name functional items with Max A multimodal cues.  SLP Short Term Goal 3 (Week 1): Patient will identify objects from a field of 2 with 90% accuracy and Min A verbal cues.  SLP Short Term Goal 4 (Week 1): Patient will answer basic yes/no question in regards to wants/needs and biographical information with 75% accuracy and Min A verbal cues.  SLP Short Term Goal 5 (Week 1): Patient will demonstrate sustained attention to a task for 2 minutes with Max A verbal cues for redirection.   Skilled Therapeutic Interventions: Skilled treatment session focused on speech and dysphagia goals. Upon arrival X 2, patient was awake while reclined in wheelchair. Patient independently requested a drink and consumed thin liquids via straw without oral holding or overt s/s of aspiration and overall Mod I. Patient participated in a basic conversation that focused on biographical information and topics of interest. Patient verbalized at the sentence level with supervision verbal cues needed for speech intelligibility but overall Mod I for verbal expression. Patient was oriented to situation but required Mod verbal cues for orientation to place. Patient left upright in wheelchair with wife present. Continue with current plan of care.      Function:  Eating Eating   Modified Consistency Diet: Yes Eating Assist Level: No help, No cues (with thin liquids via straw)            Cognition Comprehension Comprehension assist level: Understands basic 75 - 89% of the time/ requires cueing 10 - 24% of the time  Expression   Expression assist level: Expresses basic 75 - 89% of the time/requires cueing 10 - 24% of the time. Needs helper to occlude trach/needs to repeat words.  Social Interaction Social Interaction assist level: Interacts appropriately 75 - 89% of the time - Needs redirection for appropriate language or to initiate interaction.  Problem Solving Problem solving assist level: Solves basic 50 - 74% of the time/requires cueing 25 - 49% of the time  Memory Memory assist level: Recognizes or recalls 50 - 74% of the time/requires cueing 25 - 49% of the time    Pain No/Denies Pain   Therapy/Group: Individual Therapy  Zunaira Lamy 12/13/2016, 3:08 PM

## 2016-12-13 NOTE — Progress Notes (Signed)
[place] PHYSICAL MEDICINE & REHABILITATION     PROGRESS NOTE    Subjective/Complaints: Pt with increased work of breathing, wheezing per Charity fundraiser. Pt states he's feeling a little better after administration of lasix early this morning  ROS: Limited due cognitive/behavioral   Objective: Vital Signs: Blood pressure (!) 169/114, pulse (!) 118, temperature 98.2 F (36.8 C), temperature source Oral, resp. rate (!) 22, height 6\' 2"  (1.88 m), weight 106.8 kg (235 lb 6.4 oz), SpO2 95 %. Dg Chest 2 View  Result Date: 12/12/2016 CLINICAL DATA:  Respiratory distress EXAM: CHEST  2 VIEW COMPARISON:  12/03/2016 FINDINGS: Low lung volumes with mild interstitial edema. Borderline cardiomegaly with right atrial and right ventricular pacing leads noted. Left-sided pacemaker apparatus projects over the left shoulder. There is aortic atherosclerosis at the arch. Median sternotomy sutures are in place. Mild left basilar atelectasis. Layering small to moderate posterior pleural effusions. Degenerative change along the dorsal spine. IMPRESSION: Findings consistent with pulmonary edema with small to moderate posterior pleural effusions. Electronically Signed   By: Tollie Eth M.D.   On: 12/12/2016 00:15   Dg Swallowing Func-speech Pathology  Result Date: 12/11/2016 Objective Swallowing Evaluation: Type of Study: MBS-Modified Barium Swallow Study Patient Details Name: ALIZE ACY MRN: [id number] Date of Birth: 10/22/1931 Today's Date: 12/11/2016 Time: SLP Start Time (ACUTE ONLY): 0924-SLP Stop Time (ACUTE ONLY): 0948 SLP Time Calculation (min) (ACUTE ONLY): 24 min Past Medical History: Past Medical History: Diagnosis Date . Aortic stenosis   s/p AVR by Dr Laneta Simmers . COPD (chronic obstructive pulmonary disease) (HCC)  . History of coronary artery disease   status post stenting of the marginal circumflex in 12/2003 and again in 2009 . Hyperlipidemia  . Morbid obesity (HCC)   weight 243 pounds, BMI 31.2kg/m2, BSA 2.36 square  meters . Obstructive sleep apnea   compliant with CPAP . Persistent atrial fibrillation [facility])  Past Surgical History: Past Surgical History: Procedure Laterality Date . AORTIC VALVE REPLACEMENT (AVR)/CORONARY ARTERY BYPASS GRAFTING (CABG)   08/05/2011  LIMA to LAD, sequential saphenous vein graft to third and fourth obtuse marginal branches of the circumflex, aortic valve replacement using a 23 mm Edwards pericardial valve . APPENDECTOMY   . CARDIOVERSION N/A 11/14/2014  Procedure: CARDIOVERSION;  Surgeon: Donato Schultz, MD;  Location: [facility] ENDOSCOPY;  Service: Cardiovascular;  Laterality: N/A; . CARDIOVERSION N/A 11/20/2016  Procedure: CARDIOVERSION;  Surgeon: Pricilla Riffle, MD;  Location: [facility] ENDOSCOPY;  Service: Cardiovascular;  Laterality: N/A; . CAROTID ENDARTERECTOMY    Dr Lollie Sails . EP IMPLANTABLE DEVICE N/A 11/06/2015  Procedure: PPM Generator Changeout;for sick sinus syndrome with a MDT Adapta L PPM, chronically elevated RV threshold. . permanent pacemaker    MDT EnRhythm implanted by Dr Lawanda Cousins in [place] for complete heart block with syncope . REPLACEMENT TOTAL KNEE BILATERAL    2006 . TEE WITHOUT CARDIOVERSION N/A 11/14/2014  Procedure: TRANSESOPHAGEAL ECHOCARDIOGRAM (TEE);  Surgeon: Donato Schultz, MD;  Location: [facility] ENDOSCOPY;  Service: Cardiovascular;  Laterality: N/A; HPI: 83 year old male with right-sided subdural hematoma with seizures. PMHx: Aortic stenosis, COPD, Hx of CAD, Obesity, Obstructive sleep apnea, Afib.  Subjective: pt alert, needs cueing Assessment / Plan / Recommendation CHL IP CLINICAL IMPRESSIONS 12/11/2016 Therapy Diagnosis Moderate oral phase dysphagia  Clinical Impression Pt has a moderate oral dysphagia felt to be largely cognitive in nature, but otherwise with relatively intact pharyngeal phase. He has oral holding and decreased bolus cohesion, needing Mod cues to initiate posterior transit. He has premature spillage into the  pharynx and lingual residue that remains post-swallow. With  large straw sips, pt showed piecemeal swallowing. Recommend to initiate full liquid diet to decrease oral burden. Suspect that as mentation clears, pt will have good prognosis for solid advancement.  Impact on safety and function Mild aspiration risk   CHL IP TREATMENT RECOMMENDATION 12/11/2016 Treatment Recommendations Therapy as outlined in treatment plan below   Prognosis 12/11/2016 Prognosis for Safe Diet Advancement Good Barriers to Reach Goals Cognitive deficits Barriers/Prognosis Comment -- CHL IP DIET RECOMMENDATION 12/11/2016 SLP Diet Recommendations Thin liquid;Other (Comment) Liquid Administration via Cup;Straw Medication Administration Crushed with puree Compensations Minimize environmental distractions;Slow rate;Small sips/bites;Follow solids with liquid;Other (Comment) Postural Changes Seated upright at 90 degrees   CHL IP OTHER RECOMMENDATIONS 12/11/2016 Recommended Consults -- Oral Care Recommendations Oral care BID;Other (Comment) Other Recommendations Have oral suction available   CHL IP FOLLOW UP RECOMMENDATIONS 12/11/2016 Follow up Recommendations Inpatient Rehab   CHL IP FREQUENCY AND DURATION 12/11/2016 Speech Therapy Frequency (ACUTE ONLY) min 2x/week Treatment Duration 2 weeks      CHL IP ORAL PHASE 12/11/2016 Oral Phase Impaired Oral - Pudding Teaspoon -- Oral - Pudding Cup -- Oral - Honey Teaspoon -- Oral - Honey Cup -- Oral - Nectar Teaspoon -- Oral - Nectar Cup -- Oral - Nectar Straw -- Oral - Thin Teaspoon Weak lingual manipulation;Reduced posterior propulsion;Holding of bolus;Lingual/palatal residue;Delayed oral transit;Decreased bolus cohesion;Premature spillage Oral - Thin Cup Weak lingual manipulation;Reduced posterior propulsion;Holding of bolus;Lingual/palatal residue;Delayed oral transit;Decreased bolus cohesion;Premature spillage Oral - Thin Straw Weak lingual manipulation;Reduced posterior propulsion;Holding of bolus;Lingual/palatal residue;Delayed oral transit;Decreased bolus  cohesion;Piecemeal swallowing;Premature spillage Oral - Puree Weak lingual manipulation;Reduced posterior propulsion;Holding of bolus;Lingual/palatal residue;Delayed oral transit;Decreased bolus cohesion;Premature spillage Oral - Mech Soft -- Oral - Regular -- Oral - Multi-Consistency -- Oral - Pill -- Oral Phase - Comment --  CHL IP PHARYNGEAL PHASE 12/11/2016 Pharyngeal Phase WFL Pharyngeal- Pudding Teaspoon -- Pharyngeal -- Pharyngeal- Pudding Cup -- Pharyngeal -- Pharyngeal- Honey Teaspoon -- Pharyngeal -- Pharyngeal- Honey Cup -- Pharyngeal -- Pharyngeal- Nectar Teaspoon -- Pharyngeal -- Pharyngeal- Nectar Cup -- Pharyngeal -- Pharyngeal- Nectar Straw -- Pharyngeal -- Pharyngeal- Thin Teaspoon -- Pharyngeal -- Pharyngeal- Thin Cup -- Pharyngeal -- Pharyngeal- Thin Straw -- Pharyngeal -- Pharyngeal- Puree -- Pharyngeal -- Pharyngeal- Mechanical Soft -- Pharyngeal -- Pharyngeal- Regular -- Pharyngeal -- Pharyngeal- Multi-consistency -- Pharyngeal -- Pharyngeal- Pill -- Pharyngeal -- Pharyngeal Comment --  CHL IP CERVICAL ESOPHAGEAL PHASE 12/11/2016 Cervical Esophageal Phase WFL Pudding Teaspoon -- Pudding Cup -- Honey Teaspoon -- Honey Cup -- Nectar Teaspoon -- Nectar Cup -- Nectar Straw -- Thin Teaspoon -- Thin Cup -- Thin Straw -- Puree -- Mechanical Soft -- Regular -- Multi-consistency -- Pill -- Cervical Esophageal Comment -- No flowsheet data found. Maxcine Hamaiewonsky, Laura 12/11/2016, 10:14 AM  Maxcine HamLaura Paiewonsky, M.A. CCC-SLP [phone]              Recent Labs  12/12/16 0503  WBC 11.2*  HGB 13.1  HCT 41.9  PLT 227    Recent Labs  12/12/16 0503  NA 147*  K 3.7  CL 109  GLUCOSE 129*  BUN 22*  CREATININE 0.80  CALCIUM 9.1   CBG (last 3)   Recent Labs  12/11/16 0813 12/11/16 1130 12/11/16 1656  GLUCAP 102* 114* 115*    Wt Readings from Last 3 Encounters:  12/13/16 106.8 kg (235 lb 6.4 oz)  12/11/16 115.5 kg (254 lb 11.2 oz)  12/01/16 105.2 kg (232 lb)    Physical Exam:  HENT:   Head: Normocephalic.  Eyes: EOMI.  Neck: Normal range of motion. Neck supple. No tracheal deviationpresent. No thyromegalypresent.  Cardiovascular: IRR/IRR tachyin 110's Respiratory: CTA x bases. Decreased work of breathing, feels comfortable  GI: Soft. Bowel sounds are normal. He exhibits mild distension. There is no tenderness.  Musculoskeletal: He exhibits no edemaor tenderness.  Skin: Skin is warmand dry.  Neurological.alert Patient moves all extremities and senses pain--no changes Psych: distracted     Assessment/Plan: 1. Functional and cognitive deficits secondary to TBI which require 3+ hours per day of interdisciplinary therapy in a comprehensive inpatient rehab setting. Physiatrist is providing close team supervision and 24 hour management of active medical problems listed below. Physiatrist and rehab team continue to assess barriers to discharge/monitor patient progress toward functional and medical goals.  Function:  Bathing Bathing position      Bathing parts   Body parts bathed by helper: Right arm, Left arm, Chest, Abdomen  Bathing assist Assist Level: Touching or steadying assistance(Pt > 75%)      Upper Body Dressing/Undressing Upper body dressing   What is the patient wearing?: Pull over shirt/dress     Pull over shirt/dress - Perfomed by patient: Thread/unthread right sleeve, Thread/unthread left sleeve Pull over shirt/dress - Perfomed by helper: Put head through opening, Pull shirt over trunk        Upper body assist Assist Level: Touching or steadying assistance(Pt > 75%)      Lower Body Dressing/Undressing Lower body dressing   What is the patient wearing?: Pants, Socks       Pants- Performed by helper: Thread/unthread right pants leg, Thread/unthread left pants leg, Pull pants up/down       Socks - Performed by helper: Don/doff right sock, Don/doff left sock              Lower body assist Assist for lower body dressing: 2  Helpers      Financial trader activity did not occur: No continent bowel/bladder event        Toileting assist     Transfers Chair/bed transfer   Chair/bed transfer method: Stand pivot Chair/bed transfer assist level: 2 helpers       Science writer Ambulation activity did not occur: Safety/medical Investment banker, operational activity did not occur: Safety/medical concerns        Cognition Comprehension Comprehension assist level: Understands basic 75 - 89% of the time/ requires cueing 10 - 24% of the time  Expression Expression assist level: Expresses basic 50 - 74% of the time/requires cueing 25 - 49% of the time. Needs to repeat parts of sentences.  Social Interaction Social Interaction assist level: Interacts appropriately 75 - 89% of the time - Needs redirection for appropriate language or to initiate interaction.  Problem Solving Problem solving assist level: Solves basic 50 - 74% of the time/requires cueing 25 - 49% of the time  Memory Memory assist level: Recognizes or recalls 50 - 74% of the time/requires cueing 25 - 49% of the time   Medical Problem List and Plan: 1. Decreased functional mobility with altered mental statussecondary to traumatic right SDH -continue CIR therapies as tolerated 2. DVT Prophylaxis/Anticoagulation: SCDs. 3. Pain Management: Tylenol as needed 4. Mood: Ativan as needed 5. Neuropsych: This patient is not yetcapable of making decisions on hisown behalf. 6. Skin/Wound Care: Routine skin checks 7. Fluids/Electrolytes/Nutrition: Routine I&O with follow-up chemistries 8.Dysphagia.Full Liquid diet.Speech therapy follow-up. 9.Atrial fibrillation/AVR/CABG/PPM. Coumadin discontinued due  to subdural hematoma. Cardiac rate control. Amiodarone 200 mg twice a day (held per cardiology), Lopressor increased to 50 mg twice a day  -has responded to diuresis. HR still elevated but improved from  yesterday  -weight down to 107kg today!  -lasix 40mg  daily  -continue foley cath  -nebs,/supplemental oxygen  -cards help appreciated!! 10.COPD. Continue BiPAP/  -nebs 11.Seizure disorder. Keppra 1000 mg every 12 hours. Monitor for any seizure activity 12.Alcohol abuse. Monitor for withdrawal. Provide counseling as appropriate 13.Hypokalemia. I personally reviewed the patient's labs today.   -potassium supplement  -recheck labs tomorrow 14.Constipation. Laxative assistance   LOS (Days) 2 A FACE TO FACE EVALUATION WAS PERFORMED  [name], MD 12/13/2016 9:27 AM

## 2016-12-13 NOTE — Progress Notes (Signed)
Physical Therapy Note  Patient Details  Name: Alex DuhamelJohn R [name] MRN: [id number] Date of Birth: 11-20-1930 Today's Date: 12/13/2016    Time: [phone] 90 minutes  1:1 No c/o pain.  Pt able to assist with rolling to don pants with min A with bed rails. Supine to sit with mod A for trunk, pt able to move legs without assist.  Sitting balance with min A for donning shirt and shoes.  Mod A squat pivot transfer to w/c.  Sit to stand multiple attempts with +2 assist, pt with forward trunk flexion, weakness in glutes and back so pt is unable to come to full stand. Pt verbalizes need to have bowel movement.  Transfer with stedy to toilet with +2 assist for sit to stand.  Total assist for clothing management and hygiene during toileting. Pt very fatigued after toileting, requires +2 assist to stand and only able to stand 5 seconds for hygiene and clothing management.  Attempted x 5 to have pt stand with stedy to get off of toilet. Had to use maxi move due to pt fatigue.   DONAWERTH,KAREN 12/13/2016, 10:19 AM

## 2016-12-13 NOTE — Progress Notes (Addendum)
Patient HR 114-118 radial/apical. Respiration22. No distress. Reportedt to MD. No orders given.

## 2016-12-14 DIAGNOSIS — I480 Paroxysmal atrial fibrillation: Secondary | ICD-10-CM

## 2016-12-14 LAB — CBC
HCT: 42.2 % (ref 39.0–52.0)
Hemoglobin: 13.3 g/dL (ref 13.0–17.0)
MCH: 33 pg (ref 26.0–34.0)
MCHC: 31.5 g/dL (ref 30.0–36.0)
MCV: 104.7 fL — AB (ref 78.0–100.0)
PLATELETS: 189 10*3/uL (ref 150–400)
RBC: 4.03 MIL/uL — ABNORMAL LOW (ref 4.22–5.81)
RDW: 13.6 % (ref 11.5–15.5)
WBC: 15.2 10*3/uL — AB (ref 4.0–10.5)

## 2016-12-14 LAB — BASIC METABOLIC PANEL
ANION GAP: 10 (ref 5–15)
BUN: 28 mg/dL — ABNORMAL HIGH (ref 6–20)
CO2: 29 mmol/L (ref 22–32)
Calcium: 8.9 mg/dL (ref 8.9–10.3)
Chloride: 107 mmol/L (ref 101–111)
Creatinine, Ser: 0.71 mg/dL (ref 0.61–1.24)
Glucose, Bld: 117 mg/dL — ABNORMAL HIGH (ref 65–99)
POTASSIUM: 3.4 mmol/L — AB (ref 3.5–5.1)
SODIUM: 146 mmol/L — AB (ref 135–145)

## 2016-12-14 MED ORDER — POTASSIUM CHLORIDE 20 MEQ/15ML (10%) PO SOLN
20.0000 meq | Freq: Two times a day (BID) | ORAL | Status: DC
  Administered 2016-12-14 – 2016-12-15 (×2): 20 meq via ORAL
  Filled 2016-12-14 (×2): qty 15

## 2016-12-14 NOTE — Progress Notes (Signed)
[place] PHYSICAL MEDICINE & REHABILITATION     PROGRESS NOTE    Subjective/Complaints: Slept well. Had a good night. Still fatigues easily.   ROS: Limited due cognitive/behavioral   Objective: Vital Signs: Blood pressure (!) 142/90, pulse 89, temperature 98.2 F (36.8 C), temperature source Oral, resp. rate 20, height 6\' 2"  (1.88 m), weight 103 kg (227 lb), SpO2 96 %. No results found.  Recent Labs  12/12/16 0503 12/14/16 0503  WBC 11.2* 15.2*  HGB 13.1 13.3  HCT 41.9 42.2  PLT 227 189    Recent Labs  12/12/16 0503 12/14/16 0503  NA 147* 146*  K 3.7 3.4*  CL 109 107  GLUCOSE 129* 117*  BUN 22* 28*  CREATININE 0.80 0.71  CALCIUM 9.1 8.9   CBG (last 3)   Recent Labs  12/11/16 1130 12/11/16 1656  GLUCAP 114* 115*    Wt Readings from Last 3 Encounters:  12/14/16 103 kg (227 lb)  12/11/16 115.5 kg (254 lb 11.2 oz)  12/01/16 105.2 kg (232 lb)    Physical Exam:  HENT:  Head: Normocephalic.  Eyes: EOMI.  Neck: Normal range of motion. Neck supple. No tracheal deviationpresent. No thyromegalypresent.  Cardiovascular: IRR/IRR in 90's Respiratory: CTA with occ wheeze  GI: Soft. Bowel sounds are normal. He exhibits mild distension. There is no tenderness.  Musculoskeletal: He exhibits no edemaor tenderness.  Skin: Skin is warmand dry.  Neurological.slow to arouse this morning Patient moves all extremities and senses pain still Psych: remains distracted     Assessment/Plan: 1. Functional and cognitive deficits secondary to TBI which require 3+ hours per day of interdisciplinary therapy in a comprehensive inpatient rehab setting. Physiatrist is providing close team supervision and 24 hour management of active medical problems listed below. Physiatrist and rehab team continue to assess barriers to discharge/monitor patient progress toward functional and medical goals.  Function:  Bathing Bathing position      Bathing parts   Body parts bathed by  helper: Right arm, Left arm, Chest, Abdomen  Bathing assist Assist Level: Touching or steadying assistance(Pt > 75%)      Upper Body Dressing/Undressing Upper body dressing   What is the patient wearing?: Pull over shirt/dress     Pull over shirt/dress - Perfomed by patient: Thread/unthread right sleeve, Thread/unthread left sleeve Pull over shirt/dress - Perfomed by helper: Put head through opening, Pull shirt over trunk        Upper body assist Assist Level: Touching or steadying assistance(Pt > 75%)      Lower Body Dressing/Undressing Lower body dressing   What is the patient wearing?: Pants, Socks       Pants- Performed by helper: Thread/unthread right pants leg, Thread/unthread left pants leg, Pull pants up/down       Socks - Performed by helper: Don/doff right sock, Don/doff left sock              Lower body assist Assist for lower body dressing: 2 Helpers      Toileting Toileting Toileting activity did not occur: No continent bowel/bladder event   Toileting steps completed by helper: Adjust clothing prior to toileting, Adjust clothing after toileting, Performs perineal hygiene Toileting Assistive Devices: Grab bar or rail  Toileting assist Assist level: Two helpers   Transfers Chair/bed transfer   Chair/bed transfer method: Stand pivot Chair/bed transfer assist level: 2 helpers       Science writer Ambulation activity did not occur: Safety/medical concerns  Wheelchair Wheelchair activity did not occur: Safety/medical concerns        Cognition Comprehension Comprehension assist level: Understands basic 75 - 89% of the time/ requires cueing 10 - 24% of the time  Expression Expression assist level: Expresses basic 50 - 74% of the time/requires cueing 25 - 49% of the time. Needs to repeat parts of sentences.  Social Interaction Social Interaction assist level: Interacts appropriately 75 - 89% of the time - Needs redirection for  appropriate language or to initiate interaction.  Problem Solving Problem solving assist level: Solves basic 50 - 74% of the time/requires cueing 25 - 49% of the time  Memory Memory assist level: Recognizes or recalls 50 - 74% of the time/requires cueing 25 - 49% of the time   Medical Problem List and Plan: 1. Decreased functional mobility with altered mental statussecondary to traumatic right SDH -continue CIR therapies  2. DVT Prophylaxis/Anticoagulation: SCDs. 3. Pain Management: Tylenol as needed 4. Mood: Ativan as needed 5. Neuropsych: This patient is not yetcapable of making decisions on hisown behalf. 6. Skin/Wound Care: Routine skin checks 7. Fluids/Electrolytes/Nutrition: encourage PO 8.Dysphagia.Full Liquid diet.Speech therapy follow-up. 9.Atrial fibrillation/AVR/CABG/PPM. Coumadin discontinued due to subdural hematoma. Cardiac rate control. Amiodarone 200 mg twice a day (held per cardiology), Lopressor increased to 50 mg twice a day  -has responded to diuresis. HR has decreased into 80-90 range  -weight down to 103kg   -lasix 40mg  daily  -continue foley cath  -nebs,/supplemental oxygen  -cards help appreciated!! 10.COPD. Continue BiPAP/  -nebs 11.Seizure disorder. Keppra 1000 mg every 12 hours. Monitor for any seizure activity 12.Alcohol abuse. Monitor for withdrawal. Provide counseling as appropriate 13.Hypokalemia. I personally reviewed the patient's labs today.   -potassium supplement, K+ 3.4 today  -follow serially 14.Constipation. Laxative assistance 15. Leukocytosis: likely reactive. Recent ucx negative.cx with CHF. Afebrile. Clinically improving  -recheck tomorrow  -consider follow up cxr   LOS (Days) 3 A FACE TO FACE EVALUATION WAS PERFORMED  Ranelle OysterSWARTZ,ZACHARY T, MD 12/14/2016 9:11 AM

## 2016-12-14 NOTE — Progress Notes (Signed)
Subjective:  He is lying in bed being fed at the present time.  He says his breathing is better than it has been in some time.  Receiving therapy for his subdural  Objective:  Vital Signs in the last 24 hours: BP (!) 142/90 (BP Location: Right Arm)   Pulse 89   Temp 98.2 F (36.8 C) (Oral)   Resp 20   Ht 6\' 2"  (1.88 m)   Wt 103 kg (227 lb)   SpO2 96%   BMI 29.15 kg/m   Physical Exam: Pleasant bearded male in no acute distress Lungs:  Clear  Cardiac: Irregular rhythm, normal S1 and S2, no S3, 1 to 2/6 systolic murmur Extremities:  Trace edema present  Intake/Output from previous day: 02/17 0701 - 02/18 0700 In: 1460 [P.O.:1460] Out: 1875 [Urine:1875] Weight Filed Weights   12/12/16 0431 12/13/16 0527 12/14/16 0500  Weight: 114.8 kg (253 lb 1.4 oz) 106.8 kg (235 lb 6.4 oz) 103 kg (227 lb)    Lab Results: Basic Metabolic Panel:  Recent Labs  16/07/9601/16/18 0503 12/14/16 0503  NA 147* 146*  K 3.7 3.4*  CL 109 107  CO2 29 29  GLUCOSE 129* 117*  BUN 22* 28*  CREATININE 0.80 0.71    CBC:  Recent Labs  12/12/16 0503 12/14/16 0503  WBC 11.2* 15.2*  NEUTROABS 9.5*  --   HGB 13.1 13.3  HCT 41.9 42.2  MCV 105.3* 104.7*  PLT 227 189   BNP    Component Value Date/Time   BNP 358.8 (H) 12/03/2016 0718   Cardiac Panel (last 3 results)  Recent Labs  12/12/16 1153  CKTOTAL 121  CKMB 4.1  TROPONINI 0.04*  RELINDX 3.4*   Assessment/Plan:  1.  Acute diastolic heart failure responding to resumption of oral Lasix 2.  Hypokalemia 3.  Atypical atrial flutter better rate control at the present time but unable to anticoagulate due to recent subdural 4.  Hypertension somewhat labile but coming under better control  Recommendations:  Replete potassium, continue oral Lasix and watch BP at present time.      Darden PalmerW. Spencer Ahmiyah Coil, Jr.  MD [facility] IncFACC Cardiology  12/14/2016, 10:10 AM

## 2016-12-14 NOTE — IPOC Note (Signed)
Overall Plan of Care [facility](IPOC) Patient Details Name: Alex DuhamelJohn R [name] MRN: [id number] DOB: 07-20-1931  Admitting Diagnosis: SDH  Hospital Problems: Principal Problem:   Traumatic subdural bleed with LOC of 1 hour to 5 hours 59 minutes (HCC) Active Problems:   PAF (paroxysmal atrial fibrillation) (HCC)   Chronic obstructive pulmonary disease (HCC)   Coronary artery disease involving coronary bypass graft of native heart without angina pectoris   Acute pulmonary edema (HCC)   Acute diastolic CHF (congestive heart failure) (HCC)   Atrial flutter (HCC)     Functional Problem List: Nursing Bladder, Bowel, Nutrition, Pain, Safety, Skin Integrity  PT Balance, Edema, Endurance, Motor, Safety, Sensory  OT Balance, Safety, Behavior, Cognition, Edema, Endurance, Motor, Nutrition, Pain, Perception, Sensory, Skin Integrity  SLP Cognition, Nutrition, Linguistic  TR         Basic ADL's: OT Eating, Grooming, Bathing, Dressing, Toileting     Advanced  ADL's: OT       Transfers: PT Bed Mobility, Bed to Chair, Car, Occupational psychologisturniture  OT Toilet, Research scientist (life sciences)Tub/Shower     Locomotion: PT Ambulation, Psychologist, prison and probation servicesWheelchair Mobility, Stairs     Additional Impairments: OT Fuctional Use of Upper Extremity  SLP Social Cognition, Swallowing, Communication comprehension, expression Memory, Problem Solving, Social Interaction, Attention, Awareness  TR      Anticipated Outcomes Item Anticipated Outcome  Self Feeding setup  Swallowing  Min A   Basic self-care  overall min A  Toileting  min A    Bathroom Transfers min A   Bowel/Bladder  continent of b/b with mod assist  Transfers  min A  Locomotion  supervision w/c level  Communication  Min A  Cognition  Min A   Pain  pain less than 2 with min assist  Safety/Judgment  pt safety maintained with mod assist   Therapy Plan: PT Intensity: Minimum of 1-2 x/day ,45 to 90 minutes PT Frequency: 5 out of 7 days PT Duration Estimated Length of Stay: 21-28 days OT  Intensity: Minimum of 1-2 x/day, 45 to 90 minutes OT Frequency: 5 out of 7 days OT Duration/Estimated Length of Stay: ~21-28 days SLP Intensity: Minumum of 1-2 x/day, 30 to 90 minutes SLP Frequency: 3 to 5 out of 7 days SLP Duration/Estimated Length of Stay: 21-28 days        Team Interventions: Nursing Interventions Patient/Family Education, Pain Management, Bladder Management, Medication Management, Discharge Planning, Dysphagia/Aspiration Precaution Training, Bowel Management, Skin Care/Wound Management, Disease Management/Prevention  PT interventions Ambulation/gait training, Cognitive remediation/compensation, Discharge planning, DME/adaptive equipment instruction, Functional mobility training, Pain management, Splinting/orthotics, Therapeutic Activities, UE/LE Strength taining/ROM, Warden/rangerBalance/vestibular training, Community reintegration, Development worker, international aidunctional electrical stimulation, Neuromuscular re-education, Patient/family education, Museum/gallery curatortair training, Therapeutic Exercise, UE/LE Coordination activities, Wheelchair propulsion/positioning  OT Interventions Warden/rangerBalance/vestibular training, Discharge planning, Functional electrical stimulation, Pain management, Self Care/advanced ADL retraining, Therapeutic Activities, UE/LE Coordination activities, Visual/perceptual remediation/compensation, Therapeutic Exercise, Skin care/wound managment, Patient/family education, Functional mobility training, Disease mangement/prevention, Cognitive remediation/compensation, FirefighterCommunity reintegration, Fish farm managerDME/adaptive equipment instruction, Neuromuscular re-education, Psychosocial support, UE/LE Strength taining/ROM, Wheelchair propulsion/positioning  SLP Interventions Cognitive remediation/compensation, Financial traderCueing hierarchy, Dysphagia/aspiration precaution training, Environmental controls, Functional tasks, Internal/external aids, Patient/family education, Speech/Language facilitation, Therapeutic Activities  TR Interventions    SW/CM  Interventions Discharge Planning, Psychosocial Support, Patient/Family Education    Team Discharge Planning: Destination: PT-  ,OT- Home , SLP-Home Projected Follow-up: PT- , OT-  Home health OT, Outpatient OT, SLP-Home Health SLP, 24 hour supervision/assistance Projected Equipment Needs: PT- , OT- To be determined, SLP-To be determined Equipment Details: PT- , OT-probaly will need shower  and toilet equipment Patient/family involved in discharge planning: PT-  ,  OT-Patient, Family member/caregiver, SLP-Patient, Family member/caregiver  MD ELOS: 21-28 days Medical Rehab Prognosis:  Excellent Assessment: The patient has been admitted for CIR therapies with the diagnosis of TBI. The team will be addressing functional mobility, strength, stamina, balance, safety, adaptive techniques and equipment, self-care, bowel and bladder mgt, patient and caregiver education, cognition, activity tolerance, communication, swallowing, community reintegration. Goals have been set at min assist for mobility, self-care and cognition. Course complicated by acute pulmonary edema/afib. [name], MD, FAAPMR      See Team Conference Notes for weekly updates to the plan of care

## 2016-12-15 ENCOUNTER — Encounter (HOSPITAL_COMMUNITY): Payer: Self-pay | Admitting: *Deleted

## 2016-12-15 ENCOUNTER — Inpatient Hospital Stay (HOSPITAL_COMMUNITY): Payer: Medicare Other | Admitting: Physical Therapy

## 2016-12-15 ENCOUNTER — Inpatient Hospital Stay (HOSPITAL_COMMUNITY)
Admission: AD | Admit: 2016-12-15 | Discharge: 2016-12-19 | DRG: 026 | Disposition: A | Discharge status: Rehabilitation Facility | Payer: Medicare Other | Source: Ambulatory Visit | Attending: Neurological Surgery | Admitting: Neurological Surgery

## 2016-12-15 ENCOUNTER — Inpatient Hospital Stay (HOSPITAL_COMMUNITY): Payer: Medicare Other | Admitting: Occupational Therapy

## 2016-12-15 ENCOUNTER — Inpatient Hospital Stay (HOSPITAL_COMMUNITY): Payer: Medicare Other

## 2016-12-15 ENCOUNTER — Inpatient Hospital Stay (HOSPITAL_COMMUNITY): Payer: Medicare Other | Admitting: *Deleted

## 2016-12-15 ENCOUNTER — Inpatient Hospital Stay (HOSPITAL_COMMUNITY): Payer: Medicare Other | Admitting: Speech Pathology

## 2016-12-15 DIAGNOSIS — Z96653 Presence of artificial knee joint, bilateral: Secondary | ICD-10-CM | POA: Diagnosis present

## 2016-12-15 DIAGNOSIS — I4892 Unspecified atrial flutter: Secondary | ICD-10-CM | POA: Diagnosis not present

## 2016-12-15 DIAGNOSIS — S0689AA Other specified intracranial injury with loss of consciousness status unknown, initial encounter: Secondary | ICD-10-CM | POA: Diagnosis present

## 2016-12-15 DIAGNOSIS — F1010 Alcohol abuse, uncomplicated: Secondary | ICD-10-CM | POA: Diagnosis present

## 2016-12-15 DIAGNOSIS — Z7982 Long term (current) use of aspirin: Secondary | ICD-10-CM | POA: Diagnosis not present

## 2016-12-15 DIAGNOSIS — J449 Chronic obstructive pulmonary disease, unspecified: Secondary | ICD-10-CM | POA: Diagnosis present

## 2016-12-15 DIAGNOSIS — S06369A Traumatic hemorrhage of cerebrum, unspecified, with loss of consciousness of unspecified duration, initial encounter: Secondary | ICD-10-CM

## 2016-12-15 DIAGNOSIS — G4733 Obstructive sleep apnea (adult) (pediatric): Secondary | ICD-10-CM | POA: Diagnosis present

## 2016-12-15 DIAGNOSIS — Z952 Presence of prosthetic heart valve: Secondary | ICD-10-CM

## 2016-12-15 DIAGNOSIS — Z950 Presence of cardiac pacemaker: Secondary | ICD-10-CM

## 2016-12-15 DIAGNOSIS — I480 Paroxysmal atrial fibrillation: Secondary | ICD-10-CM | POA: Diagnosis not present

## 2016-12-15 DIAGNOSIS — K5900 Constipation, unspecified: Secondary | ICD-10-CM | POA: Diagnosis present

## 2016-12-15 DIAGNOSIS — E876 Hypokalemia: Secondary | ICD-10-CM | POA: Diagnosis present

## 2016-12-15 DIAGNOSIS — I482 Chronic atrial fibrillation: Secondary | ICD-10-CM | POA: Diagnosis not present

## 2016-12-15 DIAGNOSIS — R1310 Dysphagia, unspecified: Secondary | ICD-10-CM | POA: Diagnosis present

## 2016-12-15 DIAGNOSIS — I481 Persistent atrial fibrillation: Secondary | ICD-10-CM | POA: Diagnosis present

## 2016-12-15 DIAGNOSIS — Z9981 Dependence on supplemental oxygen: Secondary | ICD-10-CM | POA: Diagnosis not present

## 2016-12-15 DIAGNOSIS — D72829 Elevated white blood cell count, unspecified: Secondary | ICD-10-CM | POA: Diagnosis present

## 2016-12-15 DIAGNOSIS — W19XXXA Unspecified fall, initial encounter: Secondary | ICD-10-CM | POA: Diagnosis present

## 2016-12-15 DIAGNOSIS — R1312 Dysphagia, oropharyngeal phase: Secondary | ICD-10-CM | POA: Diagnosis not present

## 2016-12-15 DIAGNOSIS — Y92007 Garden or yard of unspecified non-institutional (private) residence as the place of occurrence of the external cause: Secondary | ICD-10-CM | POA: Diagnosis not present

## 2016-12-15 DIAGNOSIS — S065X9A Traumatic subdural hemorrhage with loss of consciousness of unspecified duration, initial encounter: Secondary | ICD-10-CM | POA: Diagnosis present

## 2016-12-15 DIAGNOSIS — Z781 Physical restraint status: Secondary | ICD-10-CM | POA: Diagnosis not present

## 2016-12-15 DIAGNOSIS — Z951 Presence of aortocoronary bypass graft: Secondary | ICD-10-CM | POA: Diagnosis not present

## 2016-12-15 DIAGNOSIS — E785 Hyperlipidemia, unspecified: Secondary | ICD-10-CM | POA: Diagnosis present

## 2016-12-15 DIAGNOSIS — S065X3S Traumatic subdural hemorrhage with loss of consciousness of 1 hour to 5 hours 59 minutes, sequela: Secondary | ICD-10-CM | POA: Diagnosis not present

## 2016-12-15 DIAGNOSIS — G40409 Other generalized epilepsy and epileptic syndromes, not intractable, without status epilepticus: Secondary | ICD-10-CM | POA: Diagnosis present

## 2016-12-15 DIAGNOSIS — J42 Unspecified chronic bronchitis: Secondary | ICD-10-CM | POA: Diagnosis not present

## 2016-12-15 DIAGNOSIS — Z79899 Other long term (current) drug therapy: Secondary | ICD-10-CM | POA: Diagnosis not present

## 2016-12-15 DIAGNOSIS — S065X0S Traumatic subdural hemorrhage without loss of consciousness, sequela: Secondary | ICD-10-CM | POA: Diagnosis not present

## 2016-12-15 DIAGNOSIS — Z87891 Personal history of nicotine dependence: Secondary | ICD-10-CM | POA: Diagnosis not present

## 2016-12-15 DIAGNOSIS — S065XAA Traumatic subdural hemorrhage with loss of consciousness status unknown, initial encounter: Secondary | ICD-10-CM

## 2016-12-15 DIAGNOSIS — I5031 Acute diastolic (congestive) heart failure: Secondary | ICD-10-CM | POA: Diagnosis not present

## 2016-12-15 DIAGNOSIS — I2510 Atherosclerotic heart disease of native coronary artery without angina pectoris: Secondary | ICD-10-CM | POA: Diagnosis present

## 2016-12-15 DIAGNOSIS — Z7901 Long term (current) use of anticoagulants: Secondary | ICD-10-CM

## 2016-12-15 DIAGNOSIS — R5383 Other fatigue: Secondary | ICD-10-CM | POA: Diagnosis present

## 2016-12-15 LAB — URINALYSIS, ROUTINE W REFLEX MICROSCOPIC
BILIRUBIN URINE: NEGATIVE
Glucose, UA: NEGATIVE mg/dL
Ketones, ur: NEGATIVE mg/dL
Nitrite: NEGATIVE
Protein, ur: NEGATIVE mg/dL
SPECIFIC GRAVITY, URINE: 1.012 (ref 1.005–1.030)
SQUAMOUS EPITHELIAL / LPF: NONE SEEN
pH: 8 (ref 5.0–8.0)

## 2016-12-15 LAB — CBC
HEMATOCRIT: 41.1 % (ref 39.0–52.0)
Hemoglobin: 12.9 g/dL — ABNORMAL LOW (ref 13.0–17.0)
MCH: 32.7 pg (ref 26.0–34.0)
MCHC: 31.4 g/dL (ref 30.0–36.0)
MCV: 104.3 fL — AB (ref 78.0–100.0)
Platelets: 196 10*3/uL (ref 150–400)
RBC: 3.94 MIL/uL — ABNORMAL LOW (ref 4.22–5.81)
RDW: 13.5 % (ref 11.5–15.5)
WBC: 12 10*3/uL — AB (ref 4.0–10.5)

## 2016-12-15 LAB — BASIC METABOLIC PANEL
ANION GAP: 7 (ref 5–15)
BUN: 23 mg/dL — AB (ref 6–20)
CALCIUM: 8.9 mg/dL (ref 8.9–10.3)
CO2: 32 mmol/L (ref 22–32)
Chloride: 107 mmol/L (ref 101–111)
Creatinine, Ser: 0.67 mg/dL (ref 0.61–1.24)
GFR calc Af Amer: >60 mL/min (ref >60)
GFR calc non Af Amer: >60 mL/min (ref >60)
GLUCOSE: 93 mg/dL (ref 65–99)
Potassium: 3.6 mmol/L (ref 3.5–5.1)
Sodium: 146 mmol/L — ABNORMAL HIGH (ref 135–145)

## 2016-12-15 MED ORDER — ONDANSETRON HCL 4 MG/2ML IJ SOLN: 4.0000 mg | Freq: Four times a day (QID) | INTRAMUSCULAR | Status: DC | PRN

## 2016-12-15 MED ORDER — METOPROLOL SUCCINATE ER 25 MG PO TB24
12.5000 mg | ORAL_TABLET | Freq: Two times a day (BID) | ORAL | Status: DC
  Administered 2016-12-17 – 2016-12-19 (×5): 12.5 mg via ORAL
  Filled 2016-12-15 (×6): qty 1

## 2016-12-15 MED ORDER — SODIUM CHLORIDE 0.9 % IV SOLN: INTRAVENOUS | Status: DC

## 2016-12-15 MED ORDER — SENNA 8.6 MG PO TABS
1.0000 | ORAL_TABLET | Freq: Two times a day (BID) | ORAL | Status: DC
  Filled 2016-12-15: qty 1

## 2016-12-15 MED ORDER — FLEET ENEMA 7-19 GM/118ML RE ENEM: 1.0000 | ENEMA | Freq: Once | RECTAL | Status: DC | PRN

## 2016-12-15 MED ORDER — BISACODYL 10 MG RE SUPP: 10.0000 mg | Freq: Every day | RECTAL | Status: DC | PRN

## 2016-12-15 MED ORDER — ACETAMINOPHEN 650 MG RE SUPP: 650.0000 mg | Freq: Four times a day (QID) | RECTAL | Status: DC | PRN

## 2016-12-15 MED ORDER — IBUPROFEN 200 MG PO TABS: 400.0000 mg | ORAL_TABLET | Freq: Every day | ORAL | Status: DC | PRN

## 2016-12-15 MED ORDER — MAGNESIUM 200 MG PO TABS
200.0000 mg | ORAL_TABLET | Freq: Every day | ORAL | Status: DC
  Filled 2016-12-15 (×3): qty 1

## 2016-12-15 MED ORDER — ACETAMINOPHEN 325 MG PO TABS: 650.0000 mg | ORAL_TABLET | Freq: Four times a day (QID) | ORAL | Status: DC | PRN

## 2016-12-15 MED ORDER — ESCITALOPRAM OXALATE 10 MG PO TABS
10.0000 mg | ORAL_TABLET | Freq: Every day | ORAL | Status: DC
  Administered 2016-12-17 – 2016-12-18 (×2): 10 mg via ORAL
  Filled 2016-12-15 (×3): qty 1

## 2016-12-15 MED ORDER — POTASSIUM CHLORIDE CRYS ER 20 MEQ PO TBCR
20.0000 meq | EXTENDED_RELEASE_TABLET | Freq: Two times a day (BID) | ORAL | Status: DC
  Administered 2016-12-15: 20 meq via ORAL
  Filled 2016-12-15: qty 1

## 2016-12-15 MED ORDER — ZOLPIDEM TARTRATE 5 MG PO TABS: 5.0000 mg | ORAL_TABLET | Freq: Every evening | ORAL | Status: DC | PRN

## 2016-12-15 MED ORDER — FUROSEMIDE 20 MG PO TABS
20.0000 mg | ORAL_TABLET | Freq: Every day | ORAL | Status: DC
  Administered 2016-12-17 – 2016-12-19 (×3): 20 mg via ORAL
  Filled 2016-12-15 (×3): qty 1

## 2016-12-15 MED ORDER — ALPRAZOLAM 0.25 MG PO TABS
0.2500 mg | ORAL_TABLET | Freq: Every day | ORAL | Status: DC
  Administered 2016-12-17 – 2016-12-18 (×2): 0.25 mg via ORAL
  Filled 2016-12-15 (×3): qty 1

## 2016-12-15 MED ORDER — ZOLPIDEM TARTRATE 5 MG PO TABS
5.0000 mg | ORAL_TABLET | Freq: Every day | ORAL | Status: DC
  Administered 2016-12-17 – 2016-12-18 (×2): 5 mg via ORAL
  Filled 2016-12-15 (×2): qty 1

## 2016-12-15 MED ORDER — ASPIRIN EC 81 MG PO TBEC
81.0000 mg | DELAYED_RELEASE_TABLET | Freq: Every day | ORAL | Status: DC
  Administered 2016-12-17 – 2016-12-19 (×3): 81 mg via ORAL
  Filled 2016-12-15 (×3): qty 1

## 2016-12-15 MED ORDER — HYDROCODONE-ACETAMINOPHEN 5-325 MG PO TABS
1.0000 | ORAL_TABLET | Freq: Two times a day (BID) | ORAL | Status: DC | PRN
  Administered 2016-12-16 – 2016-12-18 (×2): 1 via ORAL
  Filled 2016-12-15 (×3): qty 1

## 2016-12-15 MED ORDER — SENNA 8.6 MG PO TABS: 1.0000 | ORAL_TABLET | Freq: Two times a day (BID) | ORAL | Status: DC

## 2016-12-15 MED ORDER — ATORVASTATIN CALCIUM 80 MG PO TABS
80.0000 mg | ORAL_TABLET | Freq: Every day | ORAL | Status: DC
  Administered 2016-12-17 – 2016-12-19 (×3): 80 mg via ORAL
  Filled 2016-12-15 (×3): qty 1

## 2016-12-15 MED ORDER — ONDANSETRON HCL 4 MG PO TABS: 4.0000 mg | ORAL_TABLET | Freq: Four times a day (QID) | ORAL | Status: DC | PRN

## 2016-12-15 MED ORDER — SENNA 8.6 MG PO TABS
1.0000 | ORAL_TABLET | Freq: Two times a day (BID) | ORAL | Status: DC
  Administered 2016-12-17 – 2016-12-19 (×5): 8.6 mg via ORAL
  Filled 2016-12-15 (×6): qty 1

## 2016-12-15 MED ORDER — METOPROLOL TARTRATE 50 MG PO TABS
50.0000 mg | ORAL_TABLET | Freq: Two times a day (BID) | ORAL | Status: DC
  Administered 2016-12-15: 50 mg via ORAL
  Filled 2016-12-15: qty 1

## 2016-12-15 MED ORDER — SODIUM CHLORIDE 0.9 % IV SOLN
INTRAVENOUS | Status: DC
  Administered 2016-12-16: 12:00:00 via INTRAVENOUS

## 2016-12-15 MED ORDER — AMIODARONE HCL 200 MG PO TABS
200.0000 mg | ORAL_TABLET | Freq: Two times a day (BID) | ORAL | Status: DC
  Administered 2016-12-16 – 2016-12-19 (×6): 200 mg via ORAL
  Filled 2016-12-15 (×7): qty 1

## 2016-12-15 MED ORDER — POTASSIUM CHLORIDE ER 10 MEQ PO TBCR
10.0000 meq | EXTENDED_RELEASE_TABLET | Freq: Every day | ORAL | Status: DC
  Administered 2016-12-17 – 2016-12-19 (×3): 10 meq via ORAL
  Filled 2016-12-15 (×7): qty 1

## 2016-12-15 MED ORDER — HYDROCODONE-ACETAMINOPHEN 5-325 MG PO TABS: 1.0000 | ORAL_TABLET | ORAL | Status: DC | PRN

## 2016-12-15 MED ORDER — POLYETHYLENE GLYCOL 3350 17 G PO PACK: 17.0000 g | PACK | Freq: Every day | ORAL | Status: DC | PRN

## 2016-12-15 MED ORDER — B COMPLEX-C PO TABS
1.0000 | ORAL_TABLET | Freq: Every day | ORAL | Status: DC
  Administered 2016-12-17 – 2016-12-19 (×3): 1 via ORAL
  Filled 2016-12-15 (×5): qty 1

## 2016-12-15 NOTE — Progress Notes (Signed)
Occupational Therapy Note  Patient Details  Name: Alex DuhamelJohn R [name] MRN: [id number] Date of Birth: 11-14-1930  Today's Date: 12/15/2016 OT Individual Time: 1100-1130 OT Individual Time Calculation (min): 30 min  and Today's Date: 12/15/2016 OT Missed Time: 30 Minutes Missed Time Reason: Patient fatigue  Pt denied pain Individual Therapy  Pt resting in w/c upon arrival with visitors present.  Pt slow to respond to questions/commands.  RN notified and PA notified.  PA and RN attending to pt.  Pt stated he was extremely fatigued.  Pt assisted back to bed with max A for squat pivot.  Pt required tot A + 2 for sit>supine in bed.  Pt immediately closed his eyes.  Pt remained in bed with RN present.  Pt missed 30 mins skilled OT services 2/2 fatigue.    Lavone NeriLanier, Elenora Hawbaker [facility]Chappell 12/15/2016, 11:50 AM

## 2016-12-15 NOTE — Progress Notes (Signed)
Came for shift report at 1900, results had just come back from pt's CT done this afternoon at about 1700.  Results showed significant increase in subdural hematoma and a doubling of the midline shift.  Pam PA was notified and neurosurgery was called, with Cindra PresumeVincent Costella PA from neuro coming to assess pt.  Decision made to transfer pt to ICU bed [id number].  After assessing pt and obtaining information from day shift nurse Angie, Rn, called report to [place] in ICU.  V/S taken 3 times between 1900 and 2030.  Pt responding orally and moving all extremities on command.  Pupils equal at 2mm.  Family called, both son and wife, and both came to pt's bedside and were with pt when pt was tranferred by Reliant EnergySWAT Mindy Rn.  Of note, pt able to take meds prior to transfer.  PA consulted about this and he stated administering oral meds, especially Keppra and blood pressure meds was fine.  Pt tolerated them well crushed in applesauce.  Pt transferred to ICU at 2100.   Plan is to do surgery in the morning, pt stable currently.   Pt will be in the care of [facility] D[facility], neurosurgeon in ICU.  Tera HelperPamela  Dhalia Zingaro

## 2016-12-15 NOTE — Progress Notes (Signed)
Occupational Therapy Session Note  Patient Details  Name: Alex DuhamelJohn R Stenseth MRN: [id number] Date of Birth: 12-12-1930  Today's Date: 12/15/2016 OT Individual Time: 0800-0900 OT Individual Time Calculation (min): 60 min    Short Term Goals: Week 1:  OT Short Term Goal 1 (Week 1): Pt will perform stand pivot transfer with mod A +1 to BSC/ toilet OT Short Term Goal 2 (Week 1): Pt will perform sit to stand for clothing management with  mod A  OT Short Term Goal 3 (Week 1): Pt will perform bed mobility in prep for ADL task with mod A with bed rails and extra time OT Short Term Goal 4 (Week 1): Pt will be oriented x4 with mod external cues  Skilled Therapeutic Interventions/Progress Updates:    Pt resting in bed upon arrival and agreeable to participating in therapy.  Pt oriented to place, situation, and year.  Pt required max A with max verbal cues for sequencing for supine>sit EOB in preparation for transfer to w/c.  Pt performed squat pivot transfer with mod A.  Pt engaged in BADL retraining including bathing/dressing with sit<>stand from w/c at sink.  Pt required mod A for sit<>stand and min A for standing balance at sink with BUE support.  Pt initiated bathing/dressing tasks when provided with supplies/clothing. Pt noted with LUE weakness but patient initiated use during functional tasks.  Pt remained in w/c with all needs within reach.  Focus on activity tolerance, bed mobility, sit<>stand, functional transfers, standing balance, task initiation, sequencing, and safety awareness to increase independence with BADLs.   Therapy Documentation Precautions:  Precautions Precautions: Fall Precaution Comments: monitor HR/ BP; weeping edema Restrictions Weight Bearing Restrictions: No Pain:  Pt denied pain  See Function Navigator for Current Functional Status.   Therapy/Group: Individual Therapy  Rich BraveLanier, Deshawna Mcneece Chappell 12/15/2016, 9:57 AM

## 2016-12-15 NOTE — Progress Notes (Signed)
Called to room by OT due to change in patient status. Patient noted to be drowsy, increased lethargy and patient not interacting with staff as he did this am. Patient noted with slurred speech. Patient able to verbalize to nursing location and time accurately. Patient assessed by P. Love, PA. No new orders at this time. VS at 1126 B/P 112/65 HR 61 O2 sat 100% on 2L/min [place]. Patient assisted to bed by OT x 2.assist. Continue to monitor .  Cleotilde NeerJoyce, Bethania Schlotzhauer S

## 2016-12-15 NOTE — Progress Notes (Signed)
Speech Language Pathology Daily Session Note  Patient Details  Name: Alex DuhamelJohn R [name] MRN: [id number] Date of Birth: [birth year]  Today's Date: 12/15/2016 SLP Individual Time: [id number] SLP Individual Time Calculation (min): 20 min and Today's Date: 12/15/2016 SLP Missed Time: 40 Minutes Missed Time Reason: Patient fatigue  Short Term Goals: Week 1: SLP Short Term Goal 1 (Week 1): Patient will consume current diet with minimal overt s/s of aspiration with Min A verbal cues needed to reduce oral holding.  SLP Short Term Goal 2 (Week 1): Patient will name functional items with Max A multimodal cues.  SLP Short Term Goal 3 (Week 1): Patient will identify objects from a field of 2 with 90% accuracy and Min A verbal cues.  SLP Short Term Goal 4 (Week 1): Patient will answer basic yes/no question in regards to wants/needs and biographical information with 75% accuracy and Min A verbal cues.  SLP Short Term Goal 5 (Week 1): Patient will demonstrate sustained attention to a task for 2 minutes with Max A verbal cues for redirection.   Skilled Therapeutic Interventions: Skilled treatment session focused on addressing cognition goals.  Upon SLP arrival patient asleep; however, with Max assist multimodal cues patient able to sustain moments of brief arousal.  Patient reported feeling crummy due to neighbors that were loud and kept him up all night; additionally, he reported a headache.  Patient required Total assist for re-orientation to place and situation.  SLP contacted RN to request assist to get patient out of bed in hopes at increasing arousal.  RN discussed case with PA and recommended that patient not transfer at this time with new orders recieved.  As a result, patient missed 40 minutes of skilled therapy due to fatigue.  Continue with current plan of care.    Function:  Cognition Comprehension Comprehension assist level: Understands basic less than 25% of the time/ requires cueing >75% of the time   Expression   Expression assist level: Expresses basic 25 - 49% of the time/requires cueing 50 - 75% of the time. Uses single words/gestures.  Social Interaction Social Interaction assist level: Interacts appropriately less than 25% of the time. May be withdrawn or combative.  Problem Solving Problem solving assist level: Solves basic less than 25% of the time - needs direction nearly all the time or does not effectively solve problems and may need a restraint for safety  Memory Memory assist level: Recognizes or recalls less than 25% of the time/requires cueing greater than 75% of the time    Pain Pain Assessment Pain Assessment: Faces Faces Pain Scale: Hurts even more Pain Type: Acute pain Pain Location: Head Pain Orientation: Upper Pain Descriptors / Indicators: Aching;Headache Pain Onset: On-going Pain Intervention(s): RN made aware Multiple Pain Sites: No  Therapy/Group: Individual Therapy  Charlane FerrettiMelissa Rosalva Neary, M.A., CCC-SLP [phone]  Alex [name] 12/15/2016, 2:09 PM

## 2016-12-15 NOTE — Progress Notes (Signed)
NS contacted regarding worsening of bleed. Dr Bevely Palmeritty to evaluate patient. Wife informed of issues during the day as well as X ray report. She plans on coming in to discuss issues with NS.

## 2016-12-15 NOTE — H&P (Signed)
CC: increased fatigue  HPI: Alex DuhamelJohn R [name] is a 83 y.o. male who is currently in CIR.  Staff report patient has been having increased lethargy over the last several days. Repeat CT head ordered which shows increased SDH. Pt states he feels well. Denies dizziness, changes in vision, headache, one sided weakness. Nursing reports increased weakness. PMHx significant for right SDH with admission 12/03/2016-12/11/2016 (no surgery), Afib, COPD, dementia. Wife and son Alex [name](POA) present.   PMH:     Past Medical History:  Diagnosis Date  . Aortic stenosis    s/p AVR by Alex [name]  . COPD (chronic obstructive pulmonary disease) (HCC)   . History of coronary artery disease    status post stenting of the marginal circumflex in 12/2003 and again in 2009  . Hyperlipidemia   . Morbid obesity (HCC)    weight 243 pounds, BMI 31.2kg/m2, BSA 2.36 square meters  . Obstructive sleep apnea    compliant with CPAP  . Persistent atrial fibrillation (HCC)     PSH:      Past Surgical History:  Procedure Laterality Date  . AORTIC VALVE REPLACEMENT (AVR)/CORONARY ARTERY BYPASS GRAFTING (CABG)   08/05/2011   LIMA to LAD, sequential saphenous vein graft to third and fourth obtuse marginal branches of the circumflex, aortic valve replacement using a 23 mm Edwards pericardial valve  . APPENDECTOMY    . CARDIOVERSION N/A 11/14/2014   Procedure: CARDIOVERSION;  Surgeon: Alex [name];  [facility];  Service: Cardiovascular;  Laterality: N/A;  . CARDIOVERSION N/A 11/20/2016   Procedure: CARDIOVERSION;  Surgeon: Alex RifflePaula Ross [name], [name];  [facility];  Service: Cardiovascular;  Laterality: N/A;  . CAROTID ENDARTERECTOMY     Alex [name]  . EP IMPLANTABLE DEVICE N/A 11/06/2015   Procedure: PPM Generator Changeout;for sick sinus syndrome with a MDT Adapta L PPM, chronically elevated RV threshold.  . permanent pacemaker     MDT EnRhythm implanted by Alex Lawanda CousinsAl-Kori in [place] for  complete heart block with syncope  . REPLACEMENT TOTAL KNEE BILATERAL     2006  . TEE WITHOUT CARDIOVERSION N/A 11/14/2014   Procedure: TRANSESOPHAGEAL ECHOCARDIOGRAM (TEE);  Surgeon: Alex [name];  [facility];  Service: Cardiovascular;  Laterality: N/A;    SH:        Social History  Substance Use Topics  . Smoking status: Former Smoker    Packs/day: 0.50    Years: 30.00    Types: Cigarettes    Start date: 10/27/1973    Quit date: 10/28/2003  . Smokeless tobacco: Never Used  . Alcohol use 0.0 oz/week      Comment: 8 oz of wine per night    MEDS:        Prior to Admission medications   Medication Sig Start Date End Date Taking? Authorizing Provider  ALPRAZolam (XANAX) 0.25 MG tablet Take 0.25 mg by mouth at bedtime. 11/25/16  Yes Historical Provider, [name]  amiodarone (PACERONE) 200 MG tablet Take 1 tablet (200 mg total) by mouth 2 (two) times daily. 12/01/16  Yes Hillis RangeJames Allred, [name]  aspirin 81 MG tablet Take 81 mg by mouth daily.   Yes Historical Provider, [name]  atorvastatin (LIPITOR) 80 MG tablet TAKE 1 TABLET BY MOUTH DAILY 12/18/15  Yes Hillis RangeJames Allred, [name]  B Complex Vitamins (VITAMIN B COMPLEX PO) Take 1 tablet by mouth daily.    Yes Historical Provider, [name]  escitalopram (LEXAPRO) 10 MG tablet Take 10 mg by mouth at bedtime.   Yes Historical  Provider, [name]  furosemide (LASIX) 20 MG tablet Take 1 tablet (20mg ) by mouth daily. May take extra tablet for weight gain >3lbs 11/26/16  Yes Alex Nip, NP  HYDROcodone-acetaminophen (NORCO/VICODIN) 5-325 MG tablet Take 1 tablet by mouth 2 (two) times daily as needed for moderate pain.  12/03/15  Yes Historical Provider, [name]  ibuprofen (ADVIL,MOTRIN) 200 MG tablet Take 400 mg by mouth daily as needed for moderate pain.   Yes Historical Provider, [name]  Magnesium 250 MG TABS Take 1 tablet (250 mg total) by mouth daily. 11/04/16  Yes Alex Nip, NP  metoprolol succinate (TOPROL-XL) 25 MG 24 hr tablet Take 12.5  mg by mouth 2 (two) times daily.    Yes Historical Provider, [name]  potassium chloride (K-DUR) 10 MEQ tablet Take 1 tablet ( ) by mouth daily. Take extra 1 tablet if taking extra dose of lasix 11/26/16  Yes Alex Nip, NP  zolpidem (AMBIEN) 10 MG tablet Take 5-10 mg by mouth at bedtime.    Yes Historical Provider, [name]    ALLERGY: No Known Allergies  ROS: Review of Systems  Constitutional: Negative for chills and fever.  HENT: Negative.   Eyes: Negative.   Respiratory: Negative.   Cardiovascular: Negative.   Gastrointestinal: Negative.   Genitourinary: Negative.   Musculoskeletal: Negative.   Neurological: Negative for dizziness, tingling, tremors, sensory change, speech change, focal weakness, seizures, loss of consciousness and headaches.  Endo/Heme/Allergies: Bruises/bleeds easily.  Psychiatric/Behavioral: Negative.     NEUROLOGIC EXAM: Awake, alert, oriented x3 Speech appropriate CN grossly intact Motor exam: BUE and BLE 4/5 Sensation grossly intact to LT  IMAGING: IMPRESSION: Interval increase in size of large broad-based right-sided subdural hematoma now measuring 21.5 mm maximal thickness versus prior 9 mm. Increased mass effect upon the right lateral ventricle with midline shift to the left by 14.4 mm versus prior 7.4 mm.IMPRESSION: Interval increase in size of large broad-based right-sided subdural hematoma now measuring 21.5 mm maximal thickness versus prior 9 mm. Increased mass effect upon the right lateral ventricle with midline shift to the left by 14.4 mm versus prior 7.4 mm.  IMPRESSION: - 83 y.o. male with increased SDH. He is currently neurologically intact and without deficits.  PLAN: - Transfer to 77M for management - Discussed with wife and son (POA). Will plan for right cranitomy tomorrow for evacuation of SDH. - Risks and benefits of surgery discussed at length. All questions were sought and answered. Wife and son are agreeable to  surgery. - scheduled for tomorrow am - Neuro exam q 1 hour. Report any worsening.  - Call for any concerns.

## 2016-12-15 NOTE — Progress Notes (Signed)
Nursing reports that patient has been lethargic today. Arouses easily but noted to be sleepy with reports of HA. Question UTI with lingering leucocytosis. Discussed with Dr.Swartz--will order CT head for follow up.

## 2016-12-15 NOTE — Progress Notes (Signed)
Late entry. Patient remains arousable and slow to respond. Patient awake to stimuli, sternal rub. Patient reports headache but refused tylenol when offered. Continue to monitor.  Alex [name], Alex [name] S

## 2016-12-15 NOTE — Progress Notes (Signed)
Patient remains drowsy and arousable to verbal requests. Son in room and patient noted with minimal interaction with son. Patient will respond but immediately falls asleep. B/P elevated. See flowsheet. Patient noted to be "flushed" in face. Patient rubbing forehead but continued to refuse tyelnol. Rapid response called to assessed patient due to changes noted in CT results to guide RN for further treatment of patient. PA, Pam Love in to assess patient. Continue with plan of care.  Cleotilde NeerJoyce, Jovanka Westgate S

## 2016-12-15 NOTE — Progress Notes (Signed)
Patient more alert but remains drowsy and will open eyes and respond to staff questions. Patient noted with left sided weakness and increase in lethargy. Continue with plan of care.  Cleotilde NeerJoyce, Alexxis Mackert S

## 2016-12-15 NOTE — Progress Notes (Signed)
Progress Note  Patient Name: Alex [name] Date of Encounter: 12/15/2016  Primary Cardiologist: Dr. Excell Seltzer Electrophysiologist: Dr. [facility]  Subjective   Doing much better this week. Therapy is going well. Breathing is improved. No chest pain or palpitations.   Inpatient Medications    Scheduled Meds: . ALPRAZolam  0.25 mg Oral QHS  . atorvastatin  80 mg Oral q1800  . sennosides  5 mL Oral BID   And  . docusate  100 mg Oral BID  . escitalopram  10 mg Oral QHS  . feeding supplement (ENSURE ENLIVE)  237 mL Oral TID BM  . feeding supplement (PRO-STAT SUGAR FREE 64)  30 mL Oral BID  . folic acid  1 mg Oral Daily  . furosemide  40 mg Oral Daily  . levETIRAcetam  1,000 mg Oral BID  . metoprolol tartrate  50 mg Oral BID  . pantoprazole sodium  40 mg Oral Daily  . polyethylene glycol  17 g Oral Daily  . potassium chloride  20 mEq Oral BID  . thiamine  100 mg Oral Daily   Continuous Infusions:  PRN Meds: acetaminophen **OR** acetaminophen (TYLENOL) oral liquid 160 mg/5 mL **OR** acetaminophen, levalbuterol, LORazepam, ondansetron **OR** ondansetron (ZOFRAN) IV, sorbitol   Vital Signs    Vitals:   12/14/16 0619 12/14/16 1500 12/14/16 2009 12/15/16 0431  BP: (!) 142/90 133/73 126/80 (!) 164/80  Pulse: 89 61 80 63  Resp: 20 13  18   Temp:    97.8 F (36.6 C)  TempSrc:    Oral  SpO2: 96% 99%  99%  Weight:    232 lb (105.2 kg)  Height:        Intake/Output Summary (Last 24 hours) at 12/15/16 0812 Last data filed at 12/15/16 0251  Gross per 24 hour  Intake              480 ml  Output             1100 ml  Net             -620 ml   Filed Weights   12/13/16 0527 12/14/16 0500 12/15/16 0431  Weight: 235 lb 6.4 oz (106.8 kg) 227 lb (103 kg) 232 lb (105.2 kg)    Telemetry    Telemetry not available at CIR. Rrregularly irregular on exam c/w afib. Rate is controlled.   Physical Exam   GEN: No acute distress.   Neck: No JVD Cardiac: irreguarrly irregular,  no murmurs, rubs, or gallops.  Respiratory: Clear to auscultation bilaterally. GI: Soft, nontender, non-distended  MS: No edema; No deformity. Neuro:  Nonfocal  Psych: Normal affect   Labs    Chemistry Recent Labs Lab 12/12/16 0503 12/14/16 0503 12/15/16 0615  NA 147* 146* 146*  K 3.7 3.4* 3.6  CL 109 107 107  CO2 29 29 32  GLUCOSE 129* 117* 93  BUN 22* 28* 23*  CREATININE 0.80 0.71 0.67  CALCIUM 9.1 8.9 8.9  PROT 6.8  --   --   ALBUMIN 3.3*  --   --   AST 43*  --   --   ALT 38  --   --   ALKPHOS 62  --   --   BILITOT 1.7*  --   --   GFRNONAA >60 >60 >60  GFRAA >60 >60 >60  ANIONGAP 9 10 7      Hematology Recent Labs Lab 12/12/16 0503 12/14/16 0503 12/15/16 0615  WBC 11.2* 15.2* 12.0*  RBC 3.98* 4.03* 3.94*  HGB 13.1 13.3 12.9*  HCT 41.9 42.2 41.1  MCV 105.3* 104.7* 104.3*  MCH 32.9 33.0 32.7  MCHC 31.3 31.5 31.4  RDW 13.7 13.6 13.5  PLT 227 189 196    Cardiac Enzymes Recent Labs Lab 12/12/16 1153  TROPONINI 0.04*   No results for input(s): TROPIPOC in the last 168 hours.   BNPNo results for input(s): BNP, PROBNP in the last 168 hours.   DDimer No results for input(s): DDIMER in the last 168 hours.   Radiology    No results found.   Patient Profile     83 y/o male with h/o CAD s/p CABG x 3 in 2012 with bioprosthetic aortic valve replacement for aortic stenosis,  SSS s/p PPM, atrial fibrillation/flutter w/ CHA2DS2 VASc score of 3, previously on coumadin prior to admit, who presented on 12/03/16 after traumatic mechanical fall resulting in a subdural hematoma. Coumadin was discontinued and INR reversed. Pt currently in CIR. While enrolled in therapy, patient had a/c diastolic CHF/ acute pulmonary edema. Diuretics were resumed.   Assessment & Plan    1 . Traumatic Subdural Hematoma: 2/2 mechanical fall while on anticoagulation therapy with Coumadin. INR at time of admit was 4. Coumadin was discontinued and INR reversed with Vitamin K. F/u CT scan on  2/14 showed a stable right subdural hematoma with stable mass effect. Now in inpatient rehab. Overall condition is improving.   2. Atrial Fibrillation/Flutter: difficult to control in the past. Failed cardioversion's. Failed drug therapy with sotalol. Pt offered ablation but declined.  2 days prior to admission, he was seen in the afib clinic and started on amiodarone, 200 mg BID. His CHA2DS2 VASc score is 3, however coumadin has been discontinued in the setting of subdural hematoma. Given inability to resume oral anticoagulation, his amiodarone was also discontinued to prevent chemical conversion. We will continue with rate control strategy only, for the time being. His rate is well controlled with metoprolol 50 mg BID. In the future, once he fully recovers from his subdural hematoma and is able to resume anticoagulation, may consider watchman's device.   3. CAD: s/p CABG in 2012. LHC in 2014 showed multivessel CAD with mild to moderate nonobstructive RCA stenosis, severe LAD and left circumflex stenoses, and continued patency of the LIMA to LAD graft as well as the sequential vein graft to the obtuse marginal branches. He denies any anginal symptoms. Continue medical therapy. On BB and atorvastatin.   4. Aortic Valve Disease: s/p AVR per Dr. Laneta Simmers in 2012 at time of CABG. Luckily this was a tissue valve, thus no indication for anticoagulation.   5. Acute Diastolic CHF: presumed diastolic exacerbation. His last echo was in 2016 and showed normal LVEF at that time. Pt went into acute pulmonary edema last week. This was in the setting of his PO diuretic being on hold. He was given IV lasix with improvement in condition and is no back on scheduled Lasix, 40 mg PO daily. Continue lasix and beta blocker. Monitor volume status closely. He has a foley and is diuresing well. UOP -1.1L in past 24 hrs. I/Os net negative 7.8L total. Continue to monitor and record urine output and check weight daily.   6. HTN:  BP remains elevated. Can consider further titration of his metoprolol. He has a PPM, thus bradycardia not an issue.   7. SSS: Has PPM followed by Dr. Johney Frame.    Signed, Robbie Lis, PA-C  12/15/2016, 8:12 AM   Patient  seen and examined. I agree with the assessment and plan as detailed above. See also my additional thoughts below.   The patient continues to improve from the cardiac viewpoint. The cardiac issues are outlined completely above and I agree.  Willa RoughJeffrey Sulo Janczak, MD, [facility]ACC 12/15/2016 10:20 AM

## 2016-12-15 NOTE — Progress Notes (Addendum)
Physical Therapy Session Note  Patient Details  Name: Alex DuhamelJohn R [name] MRN: [id number] Date of Birth: 06-Jun-1931  Today's Date: 12/15/2016  Short Term Goals: Week 1:  PT Short Term Goal 1 (Week 1): Pt will perform functional transfers with +1 assist PT Short Term Goal 2 (Week 1): Pt will demo dynamic sitting balance with min A  Skilled Therapeutic Interventions/Progress Updates:    Attempted to see pt for scheduled PT session when IV nurse arrived reporting need to urgently place IV so pt can be sent for CT scan. Therapist discussed with RN who reports pt should not be seen at this time 2/2 awaiting transport to CT. Pt missed 60 minutes scheduled PT 2/2 nursing hold.  Therapy Documentation Precautions:  Precautions Precautions: Fall Precaution Comments: monitor HR/ BP; weeping edema Restrictions Weight Bearing Restrictions: No   General: PT Amount of Missed Time (min): 60 Minutes PT Missed Treatment Reason:  (nursing hold)   See Function Navigator for Current Functional Status.   Therapy/Group: Individual Therapy  Sandi MariscalVictoria M Dustie Brittle 12/15/2016, 5:00 PM

## 2016-12-15 NOTE — Progress Notes (Signed)
Occupational Therapy Session Note  Patient Details  Name: Alex DuhamelJohn R Espaillat MRN: [id number] Date of Birth: June 14, 1931  Today's Date: 12/15/2016 OT Individual Time: 0930-1000 OT Individual Time Calculation (min): 30 min    Short Term Goals: Week 1:  OT Short Term Goal 1 (Week 1): Pt will perform stand pivot transfer with mod A +1 to BSC/ toilet OT Short Term Goal 2 (Week 1): Pt will perform sit to stand for clothing management with  mod A  OT Short Term Goal 3 (Week 1): Pt will perform bed mobility in prep for ADL task with mod A with bed rails and extra time OT Short Term Goal 4 (Week 1): Pt will be oriented x4 with mod external cues Week 2:     Skilled Therapeutic Interventions/Progress Updates:    Focus of treatment was transfers,  Neuro-muscular reeducation, sitting balance, standing balance, attention, therapeutic activities, sustained attention, postural control.  Wife, [place], present.  Pt was oriented to month year , place but not day of week.  Performed sit<>stand at sink x1 with mod assist..  Pt stood for 1 minute with mod assist and cues for upright posture.  Positioned pt in chair and placed safety belt on.           Therapy Documentation Precautions:  Precautions Precautions: Fall Precaution Comments: monitor HR/ BP; weeping edema Restrictions Weight Bearing Restrictions: No General: General PT Missed Treatment Reason:  (nursing hold) Vital Signs: Therapy Vitals Temp: 98.5 F (36.9 C) Temp Source: Oral Pulse Rate: 60 Resp: 18 BP: (!) 156/75 Patient Position (if appropriate): Lying Oxygen Therapy SpO2: 100 % O2 Device: Nasal Cannula O2 Flow Rate (L/min): 2 L/min Pain:  none     See Function Navigator for Current Functional Status.   Therapy/Group: Individual Therapy  Humberto Sealsdwards, Deolinda Frid J 12/15/2016, 5:03 PM

## 2016-12-15 NOTE — Progress Notes (Addendum)
Patient ID: Alex [name], male   DOB: 1931-04-21, 83 y.o.   MRN: [id number]  CC: increased fatigue  HPI: Alex [name] is a 83 y.o. male who is currently in CIR.  Staff report patient has been having increased lethargy over the last several days. Repeat CT head ordered which shows increased SDH. Pt states he feels well. Denies dizziness, changes in vision, headache, one sided weakness. Nursing reports increased weakness. PMHx significant for right SDH with admission 12/03/2016-12/11/2016 (no surgery), Afib, COPD, dementia. Wife and son Alex [name]) present.   PMH: Past Medical History:  Diagnosis Date  . Aortic stenosis    s/p AVR by Dr Laneta Simmers  . COPD (chronic obstructive pulmonary disease) (HCC)   . History of coronary artery disease    status post stenting of the marginal circumflex in 12/2003 and again in 2009  . Hyperlipidemia   . Morbid obesity (HCC)    weight 243 pounds, BMI 31.2kg/m2, BSA 2.36 square meters  . Obstructive sleep apnea    compliant with CPAP  . Persistent atrial fibrillation (HCC)     PSH: Past Surgical History:  Procedure Laterality Date  . AORTIC VALVE REPLACEMENT (AVR)/CORONARY ARTERY BYPASS GRAFTING (CABG)   08/05/2011   LIMA to LAD, sequential saphenous vein graft to third and fourth obtuse marginal branches of the circumflex, aortic valve replacement using a 23 mm Edwards pericardial valve  . APPENDECTOMY    . CARDIOVERSION N/A 11/14/2014   Procedure: CARDIOVERSION;  Surgeon: Donato Schultz, MD;  Location[facility] END[facility]COPY;  Service: Cardiovascular;  Laterality: N/A;  . CARDIOVERSION N/A 11/20/2016   Procedure: CARDIOVERSION;  Surgeon: Pricilla Riffle, MD;  Location: [facility] ENDOSCOPY;  Service: Cardiovascular;  Laterality: N/A;  . CAROTID ENDARTERECTOMY     Dr Lollie Sails  . EP IMPLANTABLE DEVICE N/A 11/06/2015   Procedure: PPM Generator Changeout;for sick sinus syndrome with a MDT Adapta L PPM, chronically elevated RV threshold.  . permanent pacemaker     MDT EnRhythm  implanted by Dr Lawanda Cousins in [place] for complete heart block with syncope  . REPLACEMENT TOTAL KNEE BILATERAL     2006  . TEE WITHOUT CARDIOVERSION N/A 11/14/2014   Procedure: TRANSESOPHAGEAL ECHOCARDIOGRAM (TEE);  Surgeon: Donato Schultz, MD;  Location: [facility] ENDOSCOPY;  Service: Cardiovascular;  Laterality: N/A;    SH: Social History  Substance Use Topics  . Smoking status: Former Smoker    Packs/day: 0.50    Years: 30.00    Types: Cigarettes    Start date: 10/27/1973    Quit date: 10/28/2003  . Smokeless tobacco: Never Used  . Alcohol use 0.0 oz/week     Comment: 8 oz of wine per night    MEDS: Prior to Admission medications   Medication Sig Start Date End Date Taking? Authorizing Provider  ALPRAZolam (XANAX) 0.25 MG tablet Take 0.25 mg by mouth at bedtime. 11/25/16  Yes Historical Provider, MD  amiodarone (PACERONE) 200 MG tablet Take 1 tablet (200 mg total) by mouth 2 (two) times daily. 12/01/16  Yes Hillis Range, MD  aspirin 81 MG tablet Take 81 mg by mouth daily.   Yes Historical Provider, MD  atorvastatin (LIPITOR) 80 MG tablet TAKE 1 TABLET BY MOUTH DAILY 12/18/15  Yes Hillis Range, MD  B Complex Vitamins (VITAMIN B COMPLEX PO) Take 1 tablet by mouth daily.    Yes Historical Provider, MD  escitalopram (LEXAPRO) 10 MG tablet Take 10 mg by mouth at bedtime.   Yes Historical Provider, MD  furosemide (LASIX) 20 MG  tablet Take 1 tablet (20mg ) by mouth daily. May take extra tablet for weight gain >3lbs 11/26/16  Yes Newman Niponna C Carroll, NP  HYDROcodone-acetaminophen (NORCO/VICODIN) 5-325 MG tablet Take 1 tablet by mouth 2 (two) times daily as needed for moderate pain.  12/03/15  Yes Historical Provider, MD  ibuprofen (ADVIL,MOTRIN) 200 MG tablet Take 400 mg by mouth daily as needed for moderate pain.   Yes Historical Provider, MD  Magnesium 250 MG TABS Take 1 tablet (250 mg total) by mouth daily. 11/04/16  Yes Newman Niponna C Carroll, NP  metoprolol succinate (TOPROL-XL) 25 MG 24 hr tablet Take 12.5 mg by  mouth 2 (two) times daily.    Yes Historical Provider, MD  potassium chloride (K-DUR) 10 MEQ tablet Take 1 tablet (10meq) by mouth daily. Take extra 1 tablet if taking extra dose of lasix 11/26/16  Yes Newman Niponna C Carroll, NP  zolpidem (AMBIEN) 10 MG tablet Take 5-10 mg by mouth at bedtime.    Yes Historical Provider, MD    ALLERGY: No Known Allergies  ROS: Review of Systems  Constitutional: Negative for chills and fever.  HENT: Negative.   Eyes: Negative.   Respiratory: Negative.   Cardiovascular: Negative.   Gastrointestinal: Negative.   Genitourinary: Negative.   Musculoskeletal: Negative.   Neurological: Negative for dizziness, tingling, tremors, sensory change, speech change, focal weakness, seizures, loss of consciousness and headaches.  Endo/Heme/Allergies: Bruises/bleeds easily.  Psychiatric/Behavioral: Negative.     NEUROLOGIC EXAM: Awake, alert, oriented x3 Speech appropriate CN grossly intact Motor exam: BUE and BLE 4/5 Sensation grossly intact to LT  IMAGING: IMPRESSION: Interval increase in size of large broad-based right-sided subdural hematoma now measuring 21.5 mm maximal thickness versus prior 9 mm. Increased mass effect upon the right lateral ventricle with midline shift to the left by 14.4 mm versus prior 7.4 mm.IMPRESSION: Interval increase in size of large broad-based right-sided subdural hematoma now measuring 21.5 mm maximal thickness versus prior 9 mm. Increased mass effect upon the right lateral ventricle with midline shift to the left by 14.4 mm versus prior 7.4 mm.  IMPRESSION: - 83 y.o. male with increased SDH. He is currently neurologically intact and without deficits.  PLAN: - Transfer to [facility]M for management - Discussed with wife and son (POA). Will plan for right cranitomy tomorrow for evacuation of SDH. - Risks and benefits of surgery discussed at length. All questions were sought and answered. Wife and son are agreeable to surgery. - scheduled  for tomorrow am - Neuro exam q 1 hour. Report any worsening.  - Call for any concerns.

## 2016-12-15 NOTE — Progress Notes (Signed)
[facility]     PROGRESS NOTE    Subjective/Complaints: Good sleep. Denies breathing issues.   ROS: pt denies nausea, vomiting, diarrhea, cough, shortness of breath or chest pain   Objective: Vital Signs: Blood pressure (!) 164/80, pulse 63, temperature 97.8 F (36.6 C), temperature source Oral, resp. rate 18, height 6\' 2"  (1.88 m), weight 105.2 kg (232 lb), SpO2 99 %. No results found.  Recent Labs  12/14/16 0503 12/15/16 0615  WBC 15.2* 12.0*  HGB 13.3 12.9*  HCT 42.2 41.1  PLT 189 196    Recent Labs  12/14/16 0503 12/15/16 0615  NA 146* 146*  K 3.4* 3.6  CL 107 107  GLUCOSE 117* 93  BUN 28* 23*  CREATININE 0.71 0.67  CALCIUM 8.9 8.9   CBG (last 3)  No results for input(s): GLUCAP in the last 72 hours.  Wt Readings from Last 3 Encounters:  12/15/16 105.2 kg (232 lb)  12/11/16 115.5 kg (254 lb 11.2 oz)  12/01/16 105.2 kg (232 lb)    Physical Exam:  HENT:  Head: Normocephalic.  Eyes: EOMI.  Neck: Normal range of motion. Neck supple. No tracheal deviationpresent. No thyromegalypresent.  Cardiovascular: IRR/IRR in 80's to 90's Respiratory:CTA with normal effort GI: Soft. Bowel sounds are normal. He exhibits mild distension. There is no tenderness.  Musculoskeletal: He exhibits no edemaor tenderness.  Skin: Skin is warmand dry.  Neurological.arouses easily. Decreased insight and awareness Patient moves all extremities and senses pain still Psych: remains distracted     Assessment/Plan: 1. Functional and cognitive deficits secondary to TBI which require 3+ hours per day of interdisciplinary therapy in a comprehensive inpatient rehab setting. Physiatrist is providing close team supervision and 24 hour management of active medical problems listed below. Physiatrist and rehab team continue to assess barriers to discharge/monitor patient progress toward functional and medical goals.  Function:  Bathing Bathing  position      Bathing parts   Body parts bathed by helper: Right arm, Left arm, Chest, Abdomen  Bathing assist Assist Level: Touching or steadying assistance(Pt > 75%)      Upper Body Dressing/Undressing Upper body dressing   What is the patient wearing?: Pull over shirt/dress     Pull over shirt/dress - Perfomed by patient: Thread/unthread right sleeve, Thread/unthread left sleeve Pull over shirt/dress - Perfomed by helper: Put head through opening, Pull shirt over trunk        Upper body assist Assist Level: Touching or steadying assistance(Pt > 75%)      Lower Body Dressing/Undressing Lower body dressing   What is the patient wearing?: Pants, Socks       Pants- Performed by helper: Thread/unthread right pants leg, Thread/unthread left pants leg, Pull pants up/down       Socks - Performed by helper: Don/doff right sock, Don/doff left sock              Lower body assist Assist for lower body dressing: 2 Helpers      Toileting Toileting Toileting activity did not occur: No continent bowel/bladder event   Toileting steps completed by helper: Adjust clothing prior to toileting, Adjust clothing after toileting, Performs perineal hygiene Toileting Assistive Devices: Grab bar or rail  Toileting assist Assist level: Two helpers   Transfers Chair/bed transfer   Chair/bed transfer method: Stand pivot Chair/bed transfer assist level: 2 helpers Chair/bed transfer assistive device: Systems developerMechanical lift Mechanical lift: Maximove   Locomotion Ambulation Ambulation activity did not occur: Safety/medical concerns  Wheelchair Wheelchair activity did not occur: Safety/medical concerns        Cognition Comprehension Comprehension assist level: Understands basic 75 - 89% of the time/ requires cueing 10 - 24% of the time  Expression Expression assist level: Expresses basic 50 - 74% of the time/requires cueing 25 - 49% of the time. Needs to repeat parts of sentences.   Social Interaction Social Interaction assist level: Interacts appropriately 75 - 89% of the time - Needs redirection for appropriate language or to initiate interaction.  Problem Solving Problem solving assist level: Solves basic 50 - 74% of the time/requires cueing 25 - 49% of the time  Memory Memory assist level: Recognizes or recalls 50 - 74% of the time/requires cueing 25 - 49% of the time   Medical Problem List and Plan: 1. Decreased functional mobility with altered mental statussecondary to traumatic right SDH -continue CIR therapies PT,OT, SLP 2. DVT Prophylaxis/Anticoagulation: SCDs. 3. Pain Management: Tylenol as needed 4. Mood: Ativan as needed 5. Neuropsych: This patient is not yetcapable of making decisions on hisown behalf. 6. Skin/Wound Care: Routine skin checks 7. Fluids/Electrolytes/Nutrition: encourage PO  I personally reviewed the patient's labs today.  8.Dysphagia.Full Liquid diet.Speech therapy follow-up. 9.Atrial fibrillation/AVR/CABG/PPM. Coumadin discontinued due to subdural hematoma. Cardiac rate control. Amiodarone 200 mg twice a day (held per cardiology), Lopressor increased to 50 mg twice a day  -has responded to diuresis. HR has decreased into 80-90 range  -weight down to 105kg   -lasix 40mg  daily  -lopressor  -dc foley but follow urine outpt closely  -nebs,/supplemental oxygen  -cards help appreciated!! 10.COPD. Continue BiPAP/  -nebs 11.Seizure disorder. Keppra 1000 mg every 12 hours. Monitor for any seizure activity 12.Alcohol abuse. Monitor for withdrawal. Provide counseling as appropriate 13.Hypokalemia. I personally reviewed the patient's labs today.   -potassium supplement, K+ 3.6 today  -follow serially 14.Constipation. Laxative assistance 15. Leukocytosis: likely reactive. Recent ucx negative.cx with CHF. Afebrile. Clinically improving  -decreased to 12k today  -consider follow up cxr if any changes   LOS (Days) 4 A  FACE TO FACE EVALUATION WAS PERFORMED  Faith Rogue T, MD 12/15/2016 9:00 AM

## 2016-12-15 NOTE — Progress Notes (Signed)
RN called regarding increase lethargy that has been progressively getting worse over the last several days. CT scan completed earlier in the day resulting with increase in size of SDH associated with a increase in midline shift. On my arrival Costella PA at bedside, plan is to move him to the ICU for Right craniotomy tomorrow am. No interventions by this RN. Kathlee NationsAdvised Angela RN to call when pt is ready for transport.

## 2016-12-15 NOTE — Progress Notes (Signed)
CT results called to RN and results called to P. Love,PA. Orders received Continue with plan of care.  Cleotilde NeerJoyce, Londell Noll S

## 2016-12-15 NOTE — Progress Notes (Signed)
Please note that the xanax charted as given at 2022 on 4w rehab was NOT given and this was told in report to ICU nurse [place].  However, it is showing as scanned and at present this RN has not been able to edit this administration due to pt being discharged from this unit.  It was held due to pt's altered mental status.  Alex HelperPamela  Jacier [name]

## 2016-12-16 ENCOUNTER — Inpatient Hospital Stay (HOSPITAL_COMMUNITY): Payer: Medicare Other | Admitting: Anesthesiology

## 2016-12-16 ENCOUNTER — Encounter (HOSPITAL_COMMUNITY): Payer: Self-pay | Admitting: Certified Registered Nurse Anesthetist

## 2016-12-16 ENCOUNTER — Inpatient Hospital Stay (HOSPITAL_COMMUNITY): Payer: Medicare Other | Admitting: Speech Pathology

## 2016-12-16 ENCOUNTER — Inpatient Hospital Stay (HOSPITAL_COMMUNITY): Payer: Medicare Other | Admitting: Physical Therapy

## 2016-12-16 ENCOUNTER — Inpatient Hospital Stay (HOSPITAL_COMMUNITY): Payer: Medicare Other

## 2016-12-16 ENCOUNTER — Inpatient Hospital Stay (HOSPITAL_COMMUNITY): Payer: Medicare Other | Admitting: Occupational Therapy

## 2016-12-16 ENCOUNTER — Inpatient Hospital Stay: Admit: 2016-12-16 | Payer: Medicare Other | Admitting: Neurological Surgery

## 2016-12-16 ENCOUNTER — Encounter (HOSPITAL_COMMUNITY)
Admission: AD | Disposition: A | Discharge status: Rehabilitation Facility | Payer: Self-pay | Source: Ambulatory Visit | Attending: Neurological Surgery

## 2016-12-16 HISTORY — PX: CRANIOTOMY: SHX93

## 2016-12-16 LAB — BASIC METABOLIC PANEL
Anion gap: 8 (ref 5–15)
BUN: 15 mg/dL (ref 6–20)
CO2: 29 mmol/L (ref 22–32)
Calcium: 8 mg/dL — ABNORMAL LOW (ref 8.9–10.3)
Chloride: 108 mmol/L (ref 101–111)
Creatinine, Ser: 0.65 mg/dL (ref 0.61–1.24)
GFR calc Af Amer: >60 mL/min (ref >60)
GFR calc non Af Amer: >60 mL/min (ref >60)
Glucose, Bld: 115 mg/dL — ABNORMAL HIGH (ref 65–99)
Potassium: 3.1 mmol/L — ABNORMAL LOW (ref 3.5–5.1)
Sodium: 145 mmol/L (ref 135–145)

## 2016-12-16 LAB — TYPE AND SCREEN
ABO/RH(D): A NEG
ANTIBODY SCREEN: NEGATIVE

## 2016-12-16 LAB — CBC
HCT: 36.4 % — ABNORMAL LOW (ref 39.0–52.0)
Hemoglobin: 11.6 g/dL — ABNORMAL LOW (ref 13.0–17.0)
MCH: 32.9 pg (ref 26.0–34.0)
MCHC: 31.9 g/dL (ref 30.0–36.0)
MCV: 103.1 fL — ABNORMAL HIGH (ref 78.0–100.0)
Platelets: 190 10*3/uL (ref 150–400)
RBC: 3.53 MIL/uL — ABNORMAL LOW (ref 4.22–5.81)
RDW: 13.4 % (ref 11.5–15.5)
WBC: 11.5 10*3/uL — ABNORMAL HIGH (ref 4.0–10.5)

## 2016-12-16 LAB — ABO/RH: ABO/RH(D): A NEG

## 2016-12-16 SURGERY — CRANIOTOMY HEMATOMA EVACUATION SUBDURAL
Anesthesia: General | Site: Head | Laterality: Right

## 2016-12-16 MED ORDER — LEVETIRACETAM 500 MG PO TABS
500.0000 mg | ORAL_TABLET | Freq: Two times a day (BID) | ORAL | Status: DC
  Administered 2016-12-16: 500 mg via ORAL
  Filled 2016-12-16: qty 1

## 2016-12-16 MED ORDER — DEXAMETHASONE SODIUM PHOSPHATE 4 MG/ML IJ SOLN
4.0000 mg | Freq: Three times a day (TID) | INTRAMUSCULAR | Status: DC
  Administered 2016-12-18 – 2016-12-19 (×4): 4 mg via INTRAVENOUS
  Filled 2016-12-16 (×4): qty 1

## 2016-12-16 MED ORDER — SUGAMMADEX SODIUM 500 MG/5ML IV SOLN
INTRAVENOUS | Status: AC
  Filled 2016-12-16: qty 5

## 2016-12-16 MED ORDER — SURGIFOAM 100 EX MISC
CUTANEOUS | Status: DC | PRN
  Administered 2016-12-16: 20 mL via TOPICAL

## 2016-12-16 MED ORDER — PROPOFOL 10 MG/ML IV BOLUS
INTRAVENOUS | Status: DC | PRN
  Administered 2016-12-16: 200 mg via INTRAVENOUS

## 2016-12-16 MED ORDER — DEXTROSE 5 % IV SOLN
INTRAVENOUS | Status: DC | PRN
  Administered 2016-12-16: 45 ug/min via INTRAVENOUS
  Administered 2016-12-16: 20 ug/min via INTRAVENOUS

## 2016-12-16 MED ORDER — HEMOSTATIC AGENTS (NO CHARGE) OPTIME
TOPICAL | Status: DC | PRN
  Administered 2016-12-16: 1 via TOPICAL

## 2016-12-16 MED ORDER — DEXAMETHASONE 6 MG PO TABS: 6.0000 mg | ORAL_TABLET | Freq: Four times a day (QID) | ORAL | Status: DC

## 2016-12-16 MED ORDER — THROMBIN 5000 UNITS EX SOLR
OROMUCOSAL | Status: DC | PRN
  Administered 2016-12-16: 5 mL via TOPICAL

## 2016-12-16 MED ORDER — DEXAMETHASONE SODIUM PHOSPHATE 4 MG/ML IJ SOLN
4.0000 mg | Freq: Four times a day (QID) | INTRAMUSCULAR | Status: AC
  Administered 2016-12-17 – 2016-12-18 (×4): 4 mg via INTRAVENOUS
  Filled 2016-12-16 (×4): qty 1

## 2016-12-16 MED ORDER — PHENYLEPHRINE HCL 10 MG/ML IJ SOLN
INTRAMUSCULAR | Status: DC | PRN
  Administered 2016-12-16: 160 ug via INTRAVENOUS
  Administered 2016-12-16 (×2): 80 ug via INTRAVENOUS
  Administered 2016-12-16: 380 ug via INTRAVENOUS
  Administered 2016-12-16 (×5): 80 ug via INTRAVENOUS

## 2016-12-16 MED ORDER — SODIUM CHLORIDE 0.9 % IV SOLN: INTRAVENOUS | Status: DC

## 2016-12-16 MED ORDER — THROMBIN 20000 UNITS EX SOLR: CUTANEOUS | Status: DC | PRN

## 2016-12-16 MED ORDER — DEXAMETHASONE 4 MG PO TABS: 4.0000 mg | ORAL_TABLET | Freq: Four times a day (QID) | ORAL | Status: DC

## 2016-12-16 MED ORDER — SUGAMMADEX SODIUM 200 MG/2ML IV SOLN
INTRAVENOUS | Status: DC | PRN
  Administered 2016-12-16: 200 mg via INTRAVENOUS

## 2016-12-16 MED ORDER — BUPIVACAINE-EPINEPHRINE (PF) 0.5% -1:200000 IJ SOLN
INTRAMUSCULAR | Status: AC
  Filled 2016-12-16: qty 30

## 2016-12-16 MED ORDER — PROMETHAZINE HCL 25 MG PO TABS: 12.5000 mg | ORAL_TABLET | ORAL | Status: DC | PRN

## 2016-12-16 MED ORDER — SODIUM CHLORIDE 0.9 % IR SOLN
Status: DC | PRN
  Administered 2016-12-16: 500 mL

## 2016-12-16 MED ORDER — EPHEDRINE SULFATE 50 MG/ML IJ SOLN
INTRAMUSCULAR | Status: DC | PRN
  Administered 2016-12-16: 10 mg via INTRAVENOUS

## 2016-12-16 MED ORDER — DEXAMETHASONE 4 MG PO TABS: 4.0000 mg | ORAL_TABLET | Freq: Three times a day (TID) | ORAL | Status: DC

## 2016-12-16 MED ORDER — BACITRACIN ZINC 500 UNIT/GM EX OINT
TOPICAL_OINTMENT | CUTANEOUS | Status: AC
  Filled 2016-12-16: qty 28.35

## 2016-12-16 MED ORDER — ESMOLOL HCL 100 MG/10ML IV SOLN
INTRAVENOUS | Status: DC | PRN
  Administered 2016-12-16: 50 mg via INTRAVENOUS

## 2016-12-16 MED ORDER — SODIUM CHLORIDE 0.9 % IV SOLN
500.0000 mg | Freq: Two times a day (BID) | INTRAVENOUS | Status: DC
  Administered 2016-12-16 – 2016-12-19 (×7): 500 mg via INTRAVENOUS
  Filled 2016-12-16 (×8): qty 5

## 2016-12-16 MED ORDER — ROCURONIUM BROMIDE 100 MG/10ML IV SOLN
INTRAVENOUS | Status: DC | PRN
  Administered 2016-12-16: 50 mg via INTRAVENOUS
  Administered 2016-12-16: 40 mg via INTRAVENOUS

## 2016-12-16 MED ORDER — 0.9 % SODIUM CHLORIDE (POUR BTL) OPTIME
TOPICAL | Status: DC | PRN
  Administered 2016-12-16 (×5): 1000 mL

## 2016-12-16 MED ORDER — EPHEDRINE 5 MG/ML INJ
INTRAVENOUS | Status: AC
  Filled 2016-12-16: qty 10

## 2016-12-16 MED ORDER — LIDOCAINE-EPINEPHRINE 2 %-1:100000 IJ SOLN
INTRAMUSCULAR | Status: DC | PRN
  Administered 2016-12-16: 12.5 mL

## 2016-12-16 MED ORDER — ROCURONIUM BROMIDE 50 MG/5ML IV SOSY
PREFILLED_SYRINGE | INTRAVENOUS | Status: AC
  Filled 2016-12-16: qty 5

## 2016-12-16 MED ORDER — THROMBIN 5000 UNITS EX SOLR
CUTANEOUS | Status: AC
  Filled 2016-12-16: qty 5000

## 2016-12-16 MED ORDER — CEFAZOLIN SODIUM-DEXTROSE 2-4 GM/100ML-% IV SOLN
2.0000 g | INTRAVENOUS | Status: AC
  Administered 2016-12-16: 2 g via INTRAVENOUS
  Filled 2016-12-16: qty 100

## 2016-12-16 MED ORDER — BUPIVACAINE-EPINEPHRINE (PF) 0.5% -1:200000 IJ SOLN
INTRAMUSCULAR | Status: DC | PRN
  Administered 2016-12-16: 12.5 mL

## 2016-12-16 MED ORDER — THROMBIN 20000 UNITS EX SOLR
CUTANEOUS | Status: AC
  Filled 2016-12-16: qty 20000

## 2016-12-16 MED ORDER — ONDANSETRON HCL 4 MG/2ML IJ SOLN
INTRAMUSCULAR | Status: DC | PRN
  Administered 2016-12-16: 4 mg via INTRAVENOUS

## 2016-12-16 MED ORDER — LIDOCAINE HCL (CARDIAC) 20 MG/ML IV SOLN
INTRAVENOUS | Status: DC | PRN
  Administered 2016-12-16: 80 mg via INTRAVENOUS

## 2016-12-16 MED ORDER — ESMOLOL HCL 100 MG/10ML IV SOLN
INTRAVENOUS | Status: AC
  Filled 2016-12-16: qty 20

## 2016-12-16 MED ORDER — LIDOCAINE-EPINEPHRINE (PF) 2 %-1:200000 IJ SOLN
INTRAMUSCULAR | Status: AC
  Filled 2016-12-16: qty 20

## 2016-12-16 MED ORDER — PHENYLEPHRINE 40 MCG/ML (10ML) SYRINGE FOR IV PUSH (FOR BLOOD PRESSURE SUPPORT)
PREFILLED_SYRINGE | INTRAVENOUS | Status: AC
  Filled 2016-12-16: qty 10

## 2016-12-16 MED ORDER — FENTANYL CITRATE (PF) 100 MCG/2ML IJ SOLN
INTRAMUSCULAR | Status: DC | PRN
  Administered 2016-12-16: 150 ug via INTRAVENOUS
  Administered 2016-12-16 (×4): 50 ug via INTRAVENOUS

## 2016-12-16 MED ORDER — FENTANYL CITRATE (PF) 100 MCG/2ML IJ SOLN
INTRAMUSCULAR | Status: AC
  Filled 2016-12-16: qty 4

## 2016-12-16 MED ORDER — LABETALOL HCL 5 MG/ML IV SOLN: 10.0000 mg | INTRAVENOUS | Status: DC | PRN

## 2016-12-16 MED ORDER — SODIUM CHLORIDE 0.9 % IV SOLN
Freq: Once | INTRAVENOUS | Status: AC
  Administered 2016-12-16: 16:00:00 via INTRAVENOUS

## 2016-12-16 MED ORDER — DEXAMETHASONE SODIUM PHOSPHATE 4 MG/ML IJ SOLN
6.0000 mg | Freq: Four times a day (QID) | INTRAMUSCULAR | Status: AC
  Administered 2016-12-16 – 2016-12-17 (×4): 6 mg via INTRAVENOUS
  Filled 2016-12-16 (×4): qty 2

## 2016-12-16 MED ORDER — LIDOCAINE 2% (20 MG/ML) 5 ML SYRINGE
INTRAMUSCULAR | Status: AC
  Filled 2016-12-16: qty 5

## 2016-12-16 MED ORDER — PROPOFOL 10 MG/ML IV BOLUS
INTRAVENOUS | Status: AC
  Filled 2016-12-16: qty 20

## 2016-12-16 MED ORDER — SODIUM CHLORIDE 0.9 % IV SOLN
INTRAVENOUS | Status: DC | PRN
  Administered 2016-12-16: 12:00:00 via INTRAVENOUS

## 2016-12-16 SURGICAL SUPPLY — 82 items
BATTERY IQ STERILE (MISCELLANEOUS) ×3 IMPLANT
BENZOIN TINCTURE PRP APPL 2/3 (GAUZE/BANDAGES/DRESSINGS) IMPLANT
BLADE CLIPPER SURG (BLADE) ×3 IMPLANT
BLADE ULTRA TIP 2M (BLADE) ×3 IMPLANT
BNDG GAUZE ELAST 4 BULKY (GAUZE/BANDAGES/DRESSINGS) IMPLANT
BUR ACORN 6.0 PRECISION (BURR) ×2 IMPLANT
BUR ACORN 6.0MM PRECISION (BURR) ×1
BUR MATCHSTICK NEURO 3.0 LAGG (BURR) IMPLANT
BUR SPIRAL ROUTER 2.3 (BUR) IMPLANT
BUR SPIRAL ROUTER 2.3MM (BUR)
BUR TAPERED ROUTER 3.0 (BURR) ×3 IMPLANT
CANISTER SUCT 3000ML PPV (MISCELLANEOUS) ×3 IMPLANT
CARTRIDGE OIL MAESTRO DRILL (MISCELLANEOUS) ×1 IMPLANT
CATH ROBINSON RED A/P 14FR (CATHETERS) IMPLANT
CHLORAPREP W/TINT 26ML (MISCELLANEOUS) ×3 IMPLANT
CLIP RANEY DISP (INSTRUMENTS) ×3 IMPLANT
CLIP TI MEDIUM 6 (CLIP) IMPLANT
DIFFUSER DRILL AIR PNEUMATIC (MISCELLANEOUS) ×3 IMPLANT
DRAPE NEUROLOGICAL W/INCISE (DRAPES) ×3 IMPLANT
DRAPE SHEET LG 3/4 BI-LAMINATE (DRAPES) ×6 IMPLANT
DRAPE SURG 17X23 STRL (DRAPES) IMPLANT
DRAPE WARM FLUID 44X44 (DRAPE) ×3 IMPLANT
DRSG MEPILEX BORDER 4X8 (GAUZE/BANDAGES/DRESSINGS) ×3 IMPLANT
ELECT REM PT RETURN 9FT ADLT (ELECTROSURGICAL) ×3
ELECTRODE REM PT RTRN 9FT ADLT (ELECTROSURGICAL) ×1 IMPLANT
EVACUATOR 1/8 PVC DRAIN (DRAIN) IMPLANT
EVACUATOR SILICONE 100CC (DRAIN) IMPLANT
GAUZE SPONGE 4X4 12PLY STRL (GAUZE/BANDAGES/DRESSINGS) ×3 IMPLANT
GAUZE SPONGE 4X4 16PLY XRAY LF (GAUZE/BANDAGES/DRESSINGS) IMPLANT
GLOVE BIOGEL PI IND STRL 7.5 (GLOVE) ×2 IMPLANT
GLOVE BIOGEL PI INDICATOR 7.5 (GLOVE) ×4
GLOVE EXAM NITRILE LRG STRL (GLOVE) ×3 IMPLANT
GLOVE EXAM NITRILE XL STR (GLOVE) IMPLANT
GLOVE EXAM NITRILE XS STR PU (GLOVE) IMPLANT
GLOVE SS BIOGEL STRL SZ 7.5 (GLOVE) ×2 IMPLANT
GLOVE SUPERSENSE BIOGEL SZ 7.5 (GLOVE) ×4
GLOVE SURG SS PI 7.0 STRL IVOR (GLOVE) ×3 IMPLANT
GOWN STRL REUS W/ TWL LRG LVL3 (GOWN DISPOSABLE) ×2 IMPLANT
GOWN STRL REUS W/ TWL XL LVL3 (GOWN DISPOSABLE) IMPLANT
GOWN STRL REUS W/TWL 2XL LVL3 (GOWN DISPOSABLE) IMPLANT
GOWN STRL REUS W/TWL LRG LVL3 (GOWN DISPOSABLE) ×4
GOWN STRL REUS W/TWL XL LVL3 (GOWN DISPOSABLE)
HEMOSTAT POWDER KIT SURGIFOAM (HEMOSTASIS) ×3 IMPLANT
HEMOSTAT SURGICEL 2X14 (HEMOSTASIS) IMPLANT
KIT BASIN OR (CUSTOM PROCEDURE TRAY) ×3 IMPLANT
KIT ROOM TURNOVER OR (KITS) ×3 IMPLANT
NEEDLE HYPO 21X1.5 SAFETY (NEEDLE) ×3 IMPLANT
NEEDLE HYPO 25X1 1.5 SAFETY (NEEDLE) ×3 IMPLANT
NS IRRIG 1000ML POUR BTL (IV SOLUTION) ×6 IMPLANT
OIL CARTRIDGE MAESTRO DRILL (MISCELLANEOUS) ×3
PACK CRANIOTOMY (CUSTOM PROCEDURE TRAY) ×3 IMPLANT
PATTIES SURGICAL .5 X.5 (GAUZE/BANDAGES/DRESSINGS) IMPLANT
PATTIES SURGICAL .5 X3 (DISPOSABLE) IMPLANT
PATTIES SURGICAL .5X1.5 (GAUZE/BANDAGES/DRESSINGS) IMPLANT
PATTIES SURGICAL 1X1 (DISPOSABLE) IMPLANT
PERFORATOR LRG  14-11MM (BIT) ×2
PERFORATOR LRG 14-11MM (BIT) ×1 IMPLANT
PIN MAYFIELD SKULL DISP (PIN) ×3 IMPLANT
SPONGE NEURO XRAY DETECT 1X3 (DISPOSABLE) IMPLANT
SPONGE SURGIFOAM ABS GEL 100 (HEMOSTASIS) ×3 IMPLANT
SPONGE SURGIFOAM ABS GEL 100C (HEMOSTASIS) ×3 IMPLANT
STAPLER VISISTAT 35W (STAPLE) ×9 IMPLANT
STOCKINETTE 6  STRL (DRAPES) ×2
STOCKINETTE 6 STRL (DRAPES) ×1 IMPLANT
STRIP SURGICAL 1 X 6 IN (GAUZE/BANDAGES/DRESSINGS) IMPLANT
STRIP SURGICAL 1/2 X 6 IN (GAUZE/BANDAGES/DRESSINGS) IMPLANT
STRIP SURGICAL 1/4 X 6 IN (GAUZE/BANDAGES/DRESSINGS) IMPLANT
STRIP SURGICAL 3/4 X 6 IN (GAUZE/BANDAGES/DRESSINGS) IMPLANT
SUT ETHILON 3 0 FSL (SUTURE) IMPLANT
SUT ETHILON 3 0 PS 1 (SUTURE) IMPLANT
SUT NURALON 4 0 TR CR/8 (SUTURE) ×6 IMPLANT
SUT VIC AB 0 CT1 18XCR BRD8 (SUTURE) ×2 IMPLANT
SUT VIC AB 0 CT1 8-18 (SUTURE) ×4
SUT VIC AB 2-0 CT1 18 (SUTURE) ×24 IMPLANT
SYR 30ML LL (SYRINGE) ×6 IMPLANT
TOWEL OR 17X24 6PK STRL BLUE (TOWEL DISPOSABLE) ×3 IMPLANT
TOWEL OR 17X26 10 PK STRL BLUE (TOWEL DISPOSABLE) ×3 IMPLANT
TRAY FOLEY W/METER SILVER 16FR (SET/KITS/TRAYS/PACK) ×3 IMPLANT
TUBE CONNECTING 12'X1/4 (SUCTIONS) ×1
TUBE CONNECTING 12X1/4 (SUCTIONS) ×2 IMPLANT
UNDERPAD 30X30 (UNDERPADS AND DIAPERS) ×3 IMPLANT
WATER STERILE IRR 1000ML POUR (IV SOLUTION) ×3 IMPLANT

## 2016-12-16 NOTE — Op Note (Signed)
12/15/2016 - 12/16/2016  2:07 PM  PATIENT:  Jetty DuhamelJohn R Bines  83 y.o. male  PRE-OPERATIVE DIAGNOSIS:  Chronic subdural hematoma  POST-OPERATIVE DIAGNOSIS:  Same  PROCEDURE:  Right frontotemporoparietal craniotomy for evacuation of subdural hematoma  SURGEON:  Hulan SaasBenjamin J. Ditty, MD  ASSISTANTS: Cindra PresumeVincent Costella, PA-C  ANESTHESIA:   General  DRAINS: None   SPECIMEN:  None  INDICATION FOR PROCEDURE: 83 year old male with enlarging chronic subdural hematoma and altered mental status. Patient understood the risks, benefits, and alternatives and potential outcomes and wished to proceed.  PROCEDURE DETAILS: After smooth induction of general endotracheal anesthesia the patient was position on the OR table with the head fixated in [place]field pins and turned to the right. The right frontotemporoparietal scalp was clipped of hair and wiped out with alcohol. A reverse question mark shaped skin incision was planned. This area was anesthetized with lidocaine and Marcaine with epinephrine. The patient was prepped and draped in the usual sterile fashion.  The skin was incised sharply down to the pericranium above the temporalis. It was opened sharply to the level of the temporalis. The temporalis fascia was then sharply incised and the muscle was incised down to bone. Raney clips were applied to skin edges. A musculocutaneous flap was advanced anteriorly and inferiorly.  4 bur holes were drilled and a craniotomy was performed.  The craniotomy flap was elevated with a periosteal elevator. The dura was opened sharply. There was chronic, organized hematoma as well as thick dark fluid under pressure. There was a cortical artery that was slowly oozing. The organized hematoma was irrigated away. All loculations were opened and drained of fluid. There was no membrane on the surface of brain. There was excellent hemostasis. I irrigated and all directions until I was satisfied that there was only clear fluid returning.  Dura was closed with interrupted Vicryl sutures. I irrigated again. Multiple tack up sutures were placed within the dura. The craniotomy flap was positioned and secured with titanium plates. The tack up sutures were passed through and secured. I irrigated again with bacitracin saline. The temporalis fascia was closed with interrupted Vicryl sutures. The galea was then closed with interrupted Vicryl sutures. The skin was closed with staples. The patient was removed from Mayfield pins and a sterile dressing was applied.  PATIENT DISPOSITION:  PACU - hemodynamically stable.   Delay start of Pharmacological VTE agent (>24hrs) due to surgical blood loss or risk of bleeding:  yes

## 2016-12-16 NOTE — Discharge Summary (Deleted)
  The note originally documented on this encounter has been moved the the encounter in which it belongs.  

## 2016-12-16 NOTE — Anesthesia Preprocedure Evaluation (Addendum)
Anesthesia Evaluation  Patient identified by MRN, date of birth, ID band Patient confused    Reviewed: Allergy & Precautions, NPO status , Patient's Chart, lab work & pertinent test results  History of Anesthesia Complications Negative for: history of anesthetic complications  Airway Mallampati: I  TM Distance: >3 FB Neck ROM: Full    Dental  (+) Lower Dentures, Dental Advisory Given   Pulmonary sleep apnea and Continuous Positive Airway Pressure Ventilation , COPD, former smoker,    Pulmonary exam normal        Cardiovascular + CAD and + CABG  Normal cardiovascular exam+ dysrhythmias Atrial Fibrillation + Valvular Problems/Murmurs      Neuro/Psych Seizures -,  negative psych ROS   GI/Hepatic negative GI ROS, Neg liver ROS,   Endo/Other  negative endocrine ROS  Renal/GU negative Renal ROS     Musculoskeletal   Abdominal   Peds  Hematology   Anesthesia Other Findings   Reproductive/Obstetrics                            Anesthesia Physical  Anesthesia Plan  ASA: IV  Anesthesia Plan: General   Post-op Pain Management:    Induction: Intravenous  Airway Management Planned: Oral ETT  Additional Equipment:   Intra-op Plan:   Post-operative Plan: Post-operative intubation/ventilation  Informed Consent: I have reviewed the patients History and Physical, chart, labs and discussed the procedure including the risks, benefits and alternatives for the proposed anesthesia with the patient or authorized representative who has indicated his/her understanding and acceptance.   Consent reviewed with POA  Plan Discussed with: CRNA, Surgeon and Anesthesiologist  Anesthesia Plan Comments:        Anesthesia Quick Evaluation

## 2016-12-16 NOTE — Care Management Note (Signed)
[facility] Individual Statement of Services  Patient Name:  Alex DuhamelJohn R Silversmith  Date:  12/15/2016  Welcome to the [facility].  Our goal is to provide you with an individualized program based on your diagnosis and situation, designed to meet your specific needs.  With this comprehensive rehabilitation program, you will be expected to participate in at least 3 hours of rehabilitation therapies Monday-Friday, with modified therapy programming on the weekends.  Your rehabilitation program will include the following services:  Physical Therapy (PT), Occupational Therapy (OT), Speech Therapy (ST), 24 hour per day rehabilitation nursing, Therapeutic Recreaction (TR), Neuropsychology, Case Management (Social Worker), Rehabilitation Medicine, Nutrition Services and Pharmacy Services  Weekly team conferences will be held on Tuesdays to discuss your progress.  Your Social Worker will talk with you frequently to get your input and to update you on team discussions.  Team conferences with you and your family in attendance may also be held.  Expected length of stay: 3-4 weeks  Overall anticipated outcome: min/ mod assist  Depending on your progress and recovery, your program may change. Your Social Worker will coordinate services and will keep you informed of any changes. Your Social Worker's name and contact numbers are listed  below.  The following services may also be recommended but are not provided by the [facility]:   Driving Evaluations  Home Health Rehabiltiation Services  Outpatient Rehabilitation Services   Arrangements will be made to provide these services after discharge if needed.  Arrangements include referral to agencies that provide these services.  Your insurance has been verified to be:  [facility] Your primary doctor is:  Dr. Alben SpittleWeaver  Pertinent information will be shared with your doctor and your insurance company.  Social  Worker:  [place]cy Cherryl [place], [place] [phone] or [phone]   Information discussed with and copy given to patient by: Amada JupiterHOYLE, Unnamed Hino, 12/15/2016, 9:10 AM

## 2016-12-16 NOTE — Discharge Summary (Signed)
NAMESHRAY, HUNLEY NO.:  [id number]  MEDICAL RECORD NO.:  [id number]  LOCATION:                                FACILITY:  [facility]  PHYSICIAN:  [name], M.D.DATE OF BIRTH:  1931/08/14  DATE OF ADMISSION:  12/11/2016 DATE OF DISCHARGE:  12/15/2016                              DISCHARGE SUMMARY   DISCHARGE DIAGNOSES: 1. Traumatic right subdural hematoma. 2. SCDs for deep vein thrombosis prophylaxis. 3. Dysphagia. 4. Atrial fibrillation, aortic valve replacement with coronary artery     bypass graft and permanent pacemaker. 5. Chronic obstructive pulmonary disease. 6. Seizure disorder. 7. Alcohol abuse. 8. Hypokalemia. 9. Constipation. 10.Leukocytosis.  HISTORY OF PRESENT ILLNESS:  This is an 83 year old right-handed male, history of morbid obesity, alcohol abuse, COPD with nighttime oxygen, atrial fibrillation with AVR, CABG, maintained on aspirin and Coumadin. He lives with his wife, independent prior to admission.  Presented on December 03, 2016, after recent fall while in the yard, struck his head without loss of consciousness.  Developed dry heaving, gradual onset of moderate squeezing chest pain, shortness of breath.  CT of the head showed left parietal scalp hematoma.  Acute right hemispheric subdural hematoma with maximal thickness in the right posterior parietal region measuring 15 mm.  Right-to-left shift of 7.5 mm.  Troponin negative. INR 4.14 and was reversed.  Neurosurgery, Dr. Marikay Alar, consulted and advised conservative care.  HOSPITAL COURSE:  Confusion, delirium, soft restraints for safety. Reported grand mal seizure, December 04, 2016, required intubation and maintained on Keppra.  EEG showed occasional low-amplitude epileptiform discharges over the right posterior temporal region.  Remained off anticoagulation due to subdural hematoma.  Initially with nasogastric tube for nutritional support; however, the patient pulled out  the nasogastric tube.  A swallow study, December 11, 2016, placed on a full liquid diet.  Latest cranial CT scan, December 10, 2016, reviewed, showing stable right subdural hematoma with stable mass effect.  No new large territory infarct or parenchymal hemorrhage identified.  Physical and occupational therapy ongoing.  The patient was admitted for comprehensive rehab program.  PAST MEDICAL HISTORY:  See discharge diagnoses.  SOCIAL HISTORY:  Lives with wife, reported to be independent prior to admission.  Functional status upon admission to rehab services was +2 physical assist, sit to stand, max assist, sit to supine.  PHYSICAL EXAMINATION:  VITAL SIGNS:  Blood pressure 153/62, pulse 94, and temperature 97. GENERAL:  This was an alert male, he made good eye contact with examiner.  He was dysarthric.  He was able to provide his name as well as age.  He knew that he had recently fell.  Easily distracted. LUNGS:  Decreased breath sounds.  Clear to auscultation. CARDIAC:  Irregularly irregular. ABDOMEN:  Soft, nontender.  Good bowel sounds.  REHABILITATION HOSPITAL COURSE:  The patient was admitted to inpatient rehab services with therapies initiated on a 3-hour daily basis, consisting of physical therapy, occupational therapy, speech therapy, and rehabilitation nursing.  The following issues were addressed during the patient's rehabilitation stay.  Pertaining to Mr. Escamilla' traumatic right subdural hematoma, the patient was presented to inpatient rehab services.  Slow progressive gains.  He  was limited initially by some fluid overload.  Lasix was resumed back to his regimen.  Cardiology Services consulted for adjustments in medications.  His amiodarone was discontinued.  Lopressor advanced due to some tachycardia.  He remained with a full liquid diet.  Therapies remained slow, bouts of lethargy noted, limited endurance.  Cardiology continued follow up with resuming of his Lasix  therapy.  Bouts of hypokalemia, improved with potassium supplement.  On the afternoon of December 15, 2016, reports of increased lethargy, CT of the head was completed that showed interval increase in size of large broad-based right-sided subdural hematoma measuring 21.5 mm maximal thickness versus 9 mm.  Increase mass effect upon the right lateral ventricle with midline shift to the left by 14.4 mm versus prior 7.4.  With these noted studies, Neurosurgery was consulted, Dr. Sharlet SalinaBenjamin Ditty, he was transferred to neuro intensive care for ongoing monitoring.  Condition guarded.  All issues discussed with his wife. All medication changes made as per Neurosurgery.     Mariam Dollaraniel Angiulli, P.A.   ______________________________ Ranelle OysterZachary T. Swartz, M.D.    DA/MEDQ  D:  12/16/2016  T:  12/16/2016  Job:  [id number]

## 2016-12-16 NOTE — Progress Notes (Signed)
Per Dr. Krista BlueSinger no type and screen needed.

## 2016-12-16 NOTE — Progress Notes (Signed)
No acute changes Ready for OR

## 2016-12-16 NOTE — Progress Notes (Addendum)
Social Work  Social Work Assessment and Plan  Patient Details  Name: Alex DuhamelJohn R [name] MRN: [id number] Date of Birth: July 20, 1931  Today's Date: 12/15/2016  Problem List:  Patient Active Problem List   Diagnosis Date Noted  . Acute pulmonary edema (HCC) 12/12/2016  . Acute diastolic CHF (congestive heart failure) (HCC) 12/12/2016  . Atrial flutter (HCC) 12/12/2016  . Traumatic subdural bleed with LOC of 1 hour to 5 hours 59 minutes (HCC) 12/11/2016  . Class 1 obesity due to excess calories with body mass index (BMI) of 30.0 to 30.9 in adult   . ETOH abuse   . Chronic obstructive pulmonary disease (HCC)   . Supplemental oxygen dependent   . Alcohol use   . OSA on CPAP   . Atrial fibrillation (HCC)   . Coronary artery disease involving coronary bypass graft of native heart without angina pectoris   . Seizures (HCC)   . Dysphagia   . Bradycardia   . Hyperglycemia   . Agitation   . Hypokalemia   . Hypernatremia   . Leukocytosis   . Macrocytic anemia   . Thrombocytopenia (HCC)   . Traumatic subdural hematoma without loss of consciousness (HCC)   . Change in mental status   . Subdural hematoma (HCC) 12/03/2016  . Sick sinus syndrome (HCC) 11/06/2015  . PAF (paroxysmal atrial fibrillation) (HCC) 11/14/2014  . SOB (shortness of breath) 10/31/2014  . Persistent atrial fibrillation (HCC) 10/31/2014  . Aortic stenosis, severe 07/28/2011  . 3-vessel coronary artery disease 07/28/2011  . Hyperlipidemia, mixed 07/28/2011   Past Medical History:  Past Medical History:  Diagnosis Date  . Aortic stenosis    s/p AVR by Dr Laneta SimmersBartle  . COPD (chronic obstructive pulmonary disease) (HCC)   . History of coronary artery disease    status post stenting of the marginal circumflex in 12/2003 and again in 2009  . Hyperlipidemia   . Morbid obesity (HCC)    weight 243 pounds, BMI 31.2kg/m2, BSA 2.36 square meters  . Obstructive sleep apnea    compliant with CPAP  . Persistent atrial fibrillation  [facility](HCC)    Past Surgical History:  Past Surgical History:  Procedure Laterality Date  . AORTIC VALVE REPLACEMENT (AVR)/CORONARY ARTERY BYPASS GRAFTING (CABG)   08/05/2011   LIMA to LAD, sequential saphenous vein graft to third and fourth obtuse marginal branches of the circumflex, aortic valve replacement using a 23 mm Edwards pericardial valve  . APPENDECTOMY    . CARDIOVERSION N/A 11/14/2014   Procedure: CARDIOVERSION;  Surgeon: Donato SchultzMark Skains, MD;  [facility];  Service: Cardiovascular;  Laterality: N/A;  . CARDIOVERSION N/A 11/20/2016   Procedure: CARDIOVERSION;  Surgeon: Pricilla RifflePaula Ross V, MD;  [facility];  Service: Cardiovascular;  Laterality: N/A;  . CAROTID ENDARTERECTOMY     Dr Lollie Sailsale Williams  . EP IMPLANTABLE DEVICE N/A 11/06/2015   Procedure: PPM Generator Changeout;for sick sinus syndrome with a MDT Adapta L PPM, chronically elevated RV threshold.  . permanent pacemaker     MDT EnRhythm implanted by Dr Lawanda CousinsAl-Kori in [place] for complete heart block with syncope  . REPLACEMENT TOTAL KNEE BILATERAL     2006  . TEE WITHOUT CARDIOVERSION N/A 11/14/2014   Procedure: TRANSESOPHAGEAL ECHOCARDIOGRAM (TEE);  Surgeon: Donato SchultzMark Skains, MD;  [facility];  Service: Cardiovascular;  Laterality: N/A;   Social History:  reports that he quit smoking about 13 years ago. His smoking use included Cigarettes. He started smoking about 43 years ago. He has a 15.00  pack-year smoking history. He has never used smokeless tobacco. He reports that he drinks alcohol. He reports that he does not use drugs.  Family / Support Systems Marital Status: Married How Long?: 50 yrs (2nd marriage for both) Patient Roles: Spouse, Parent Spouse/Significant Other: wife, Alex [name] @ (C) [phone] or (H) [phone] Children: son, Alex [name] @ [phone] Anticipated Caregiver: wife and hired assist Ability/Limitations of Caregiver: wife can provide supervision level, would hire assist if  needed Caregiver Availability: 24/7 Family Dynamics: PT adn wife report that their son, Alex [name], is very supportive.  Pt does have a daughter from 1st marriage but has no contact with her.  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  He is denying any emotional distress, however, will likely benefit from a neuropsychology consult during his CIR stay. Recent Psychosocial Issues: None Pyschiatric History: None Substance Abuse History: None  Patient / Family Perceptions, Expectations & Goals Pt/Family understanding of illness & functional limitations: Pt and wife with basic understanding of his fall, SDH and current functional limitations/ need for CIR. Premorbid pt/family roles/activities: Pt was independent overall, but has been having falls as he refused to use any AD for mobility safety. Anticipated changes in roles/activities/participation: Pt expected to require hands on assistance and wife to assume primary caregiver support role. Pt/family expectations/goals: "I need to get better."  [employer]: None Premorbid Home Care/DME Agencies: None Transportation available at discharge: yes Resource referrals recommended: Neuropsychology, Support group (specify)  Discharge Planning Living Arrangements:  Spouse/significant other Support Systems: Spouse/significant other, Children, Manufacturing engineer, Psychologist, clinical community Type of Residence: Private residence Insurance Resources: [employer] ((Multimedia programmer)) Financial Resources: Restaurant manager, fast food Screen Referred: No Living Expenses: Own Money Management: Patient Does the patient have any problems obtaining your medications?: No Home Management: pt and wife Patient/Family Preliminary Plans: Pt to return home with wife as primary support.  Son and private duty to assis as well/ available. Social Work Anticipated Follow Up Needs: HH/OP Expected length of stay: ELOS 20 -25 days  Clinical Impression Pleasant gentleman who is more alert today than on Friday when first attempted interview.  He is able to answer general questions accurately (per review with wife).  He denies any emotional distress, however, will monitor and anticipate referral for neuropsychology while on CIR.  Wife very supportive and aware that he will require 24/7 assistance.  Son is supportive and may need to hire some private duty support as well.  Will follow for support and d/c planning needs.  Gussie Murton 12/15/2016, 9:00 AM

## 2016-12-16 NOTE — Anesthesia Procedure Notes (Signed)
Procedure Name: Intubation Date/Time: 12/16/2016 11:54 AM Performed by: Salli Quarry Jaesean Litzau Pre-anesthesia Checklist: Patient identified, Emergency Drugs available, Suction available and Patient being monitored Patient Re-evaluated:Patient Re-evaluated prior to inductionOxygen Delivery Method: Circle System Utilized Preoxygenation: Pre-oxygenation with 100% oxygen Intubation Type: IV induction Ventilation: Mask ventilation without difficulty Laryngoscope Size: Mac and 4 Grade View: Grade I Tube type: Oral Tube size: 7.5 mm Number of attempts: 1 Airway Equipment and Method: Stylet Placement Confirmation: ETT inserted through vocal cords under direct vision,  positive ETCO2 and breath sounds checked- equal and bilateral Secured at: 23 cm Tube secured with: Tape Dental Injury: Teeth and Oropharynx as per pre-operative assessment  Comments: Intubation performed by Kelli Hope, SRNA

## 2016-12-16 NOTE — Transfer of Care (Signed)
Immediate Anesthesia Transfer of Care Note  Patient: Alex DuhamelJohn R [name]  Procedure(s) Performed: Procedure(s): CRANIOTOMY HEMATOMA EVACUATION SUBDURAL (Right)  Patient Location: PACU  Anesthesia Type:General  Level of Consciousness: lethargic and responds to stimulation  Airway & Oxygen Therapy: Patient Spontanous Breathing and Patient connected to face mask oxygen  Post-op Assessment: Report given to RN and Post -op Vital signs reviewed and stable  Post vital signs: Reviewed and stable  Last Vitals:  Vitals:   12/16/16 1000 12/16/16 1425  BP: (!) 188/87 93/63  Pulse: 62 80  Resp: 15 20  Temp:  36.3 C    Last Pain:  Vitals:   12/16/16 0917  TempSrc:   PainSc: Asleep         Complications: No apparent anesthesia complications

## 2016-12-16 NOTE — Discharge Summary (Signed)
Discharge summary job # [id number]

## 2016-12-17 ENCOUNTER — Encounter (HOSPITAL_COMMUNITY): Payer: Self-pay | Admitting: Neurological Surgery

## 2016-12-17 ENCOUNTER — Inpatient Hospital Stay (HOSPITAL_COMMUNITY): Payer: Medicare Other

## 2016-12-17 LAB — URINE CULTURE

## 2016-12-17 MED ORDER — MAGNESIUM OXIDE 400 (241.3 MG) MG PO TABS
200.0000 mg | ORAL_TABLET | Freq: Every day | ORAL | Status: DC
  Administered 2016-12-18 – 2016-12-19 (×2): 200 mg via ORAL
  Filled 2016-12-17: qty 0.5
  Filled 2016-12-17 (×2): qty 1

## 2016-12-17 NOTE — Evaluation (Signed)
Physical Therapy Evaluation Patient Details Name: Alex [name] MRN: [id number] DOB: 07-Apr-1931 Today's Date: 12/17/2016   History of Present Illness  Pt is an 83 y.o. male s/p CRANIOTOMY HEMATOMA EVACUATION SUBDURAL (Right).   Clinical Impression  Pt presented supine in bed with HOB elevated, awake and willing to participate in therapy session. Prior to admission, pt was at [facility] working on transfers and ADLs with assist. Pt currently requires max to total A x2 for bed mobility. Attempted to stand x3 from bed with total A x2 with active resistance from pt and inability to achieve upright standing position. Pt also with fluctuating cognition throughout session. Pt initially very conversant and alert; however, with rapid decline when asked specific orientation type questions. Then again at end of session, pt alert and responsive. Pt's RN was notified. PT continuing to recommend pt return to CIR to maximize safety and independence with functional mobility prior to returning home. PT will follow acutely.     Follow Up Recommendations CIR    Equipment Recommendations  None recommended by PT    Recommendations for Other Services       Precautions / Restrictions Precautions Precautions: Fall Restrictions Weight Bearing Restrictions: No      Mobility  Bed Mobility Overal bed mobility: Needs Assistance Bed Mobility: Supine to Sit;Sit to Supine     Supine to sit: +2 for physical assistance;Max assist Sit to supine: +2 for physical assistance;Total assist   General bed mobility comments: increased time, use of bed rails, max VC'ing for sequencing. max A at bilateral LEs and trunk  Transfers Overall transfer level: Needs assistance               General transfer comment: Attempted sit to stand from EOB x3 with total assist +2 and pt unable to acheive standing with active resistance  Ambulation/Gait                Stairs            Wheelchair Mobility    Modified  Rankin (Stroke Patients Only)       Balance Overall balance assessment: Needs assistance Sitting-balance support: Feet supported;No upper extremity supported Sitting balance-[name]: Good                                       Pertinent Vitals/Pain Pain Assessment: Faces Faces Pain Scale: No hurt    Home Living Family/patient expects to be discharged to:: Inpatient rehab                      Prior Function Level of Independence: Needs assistance   Gait / Transfers Assistance Needed: pt working on transfers with assist in CIR  ADL's / Homemaking Assistance Needed: assist with ADL        Hand Dominance        Extremity/Trunk Assessment   Upper Extremity Assessment Upper Extremity Assessment: Defer to OT evaluation     Lower Extremity Assessment Lower Extremity Assessment: Generalized weakness       Communication   Communication: No difficulties;Expressive difficulties  Cognition Arousal/Alertness: Awake/alert Behavior During Therapy: WFL for tasks assessed/performed Overall Cognitive Status: Impaired/Different from baseline Area of Impairment: Problem solving;Safety/judgement;Following commands       Following Commands: Follows one step commands with increased time;Follows one step commands inconsistently Safety/Judgement: Decreased awareness of safety;Decreased awareness of deficits   Problem Solving: Slow  processing;Decreased initiation;Requires verbal cues;Requires tactile cues General Comments: Pt alert and conversive upon therapy arrival. Once OT began asking pt orientation questions pt with sharp decline in cognition, had difficulty with word finding and difficulty answering questions. Pt also with difficulty following commands. Pt remained this way until the end of session when he began joining in conversation again.    General Comments      Exercises     Assessment/Plan    PT Assessment Patient needs continued PT  services  PT Problem List Decreased strength;Decreased activity tolerance;Decreased balance;Decreased mobility;Decreased coordination;Decreased cognition;Decreased knowledge of use of DME;Decreased safety awareness       PT Treatment Interventions DME instruction;Gait training;Functional mobility training;Therapeutic activities;Therapeutic exercise;Balance training;Neuromuscular re-education;Cognitive remediation;Patient/family education    PT Goals (Current goals can be found in the Care Plan section)  Acute Rehab PT Goals Patient Stated Goal: rehab then home PT Goal Formulation: With family Time For Goal Achievement: 12/31/16 Potential to Achieve Goals: Fair    Frequency Min 3X/week   Barriers to discharge        Co-evaluation PT/OT/SLP Co-Evaluation/Treatment: Yes Reason for Co-Treatment: Complexity of the patient's impairments (multi-system involvement);For patient/therapist safety PT goals addressed during session: Mobility/safety with mobility;Balance OT goals addressed during session: ADL's and self-care       End of Session Equipment Utilized During Treatment: Gait belt Activity Tolerance: Patient tolerated treatment well Patient left: in bed;with call bell/phone within reach;with family/visitor present Nurse Communication: Mobility status PT Visit Diagnosis: Muscle weakness (generalized) (M62.81);Other abnormalities of gait and mobility (R26.89)         Time: [id number] PT Time Calculation (min) (ACUTE ONLY): 31 min   Charges:   PT Evaluation $PT Eval Moderate Complexity: 1 Procedure     PT G CodesAlessandra Bevels:         Kit Brubacher M Gracelynn Bircher 12/17/2016, 5:24 PM Deborah ChalkJennifer Saaya Procell, PT, DPT [phone]

## 2016-12-17 NOTE — Progress Notes (Signed)
Pt seen and examined. No issues overnight.  EXAM: Temp:  [97.2 F (36.2 C)-97.9 F (36.6 C)] 97.2 F (36.2 C) (02/21 0800) Pulse Rate:  [28-112] 41 (02/21 0800) Resp:  [13-31] 14 (02/21 0800) BP: (93-188)/(60-118) 155/77 (02/21 0800) SpO2:  [85 %-100 %] 94 % (02/21 0800) Arterial Line BP: (77-190)/(42-94) 189/94 (02/20 2200) Weight:  [103.2 kg (227 lb 8.2 oz)] 103.2 kg (227 lb 8.2 oz) (02/20 1100) Intake/Output      02/20 0701 - 02/21 0700 02/21 0701 - 02/22 0700   P.O.     I.V. (mL/kg) 1230 (11.9) 10 (0.1)   IV Piggyback 210    Total Intake(mL/kg) 1440 (14) 10 (0.1)   Urine (mL/kg/hr) 2550 (1)    Stool 0 (0)    Blood 200 (0.1)    Total Output 2750     Net -1310 +10        Stool Occurrence 1 x     Awake and alert Follows commands throughout Moving both sides well Bandage c/d/i  Stable Transfer to floor Therapy to see

## 2016-12-17 NOTE — Progress Notes (Addendum)
I am familiar with pt case , as he was admitted to inpt rehab on 12/11/16. Readmitted to ICU 2/19. I will follow his case to assist in planning dispo as appropriate. Please order PT, OT, and SLP when appropriate. [phone]

## 2016-12-17 NOTE — Evaluation (Signed)
Clinical/Bedside Swallow Evaluation Patient Details  Name: Alex DuhamelJohn R [name] MRN: [id number] Date of Birth: [birth year]  Today's Date: 12/17/2016 Time: SLP Start Time (ACUTE ONLY): 1138 SLP Stop Time (ACUTE ONLY): 1145 SLP Time Calculation (min) (ACUTE ONLY): 7 min  Past Medical History:  Past Medical History:  Diagnosis Date  . Aortic stenosis    s/p AVR by Dr Laneta SimmersBartle  . COPD (chronic obstructive pulmonary disease) (HCC)   . History of coronary artery disease    status post stenting of the marginal circumflex in 12/2003 and again in 2009  . Hyperlipidemia   . Morbid obesity (HCC)    weight 243 pounds, BMI 31.2kg/m2, BSA 2.36 square meters  . Obstructive sleep apnea    compliant with CPAP  . Persistent atrial fibrillation [facility](HCC)    Past Surgical History:  Past Surgical History:  Procedure Laterality Date  . AORTIC VALVE REPLACEMENT (AVR)/CORONARY ARTERY BYPASS GRAFTING (CABG)   08/05/2011   LIMA to LAD, sequential saphenous vein graft to third and fourth obtuse marginal branches of the circumflex, aortic valve replacement using a 23 mm Edwards pericardial valve  . APPENDECTOMY    . CARDIOVERSION N/A 11/14/2014   Procedure: CARDIOVERSION;  Surgeon: Donato SchultzMark Skains, MD;  [facility];  Service: Cardiovascular;  Laterality: N/A;  . CARDIOVERSION N/A 11/20/2016   Procedure: CARDIOVERSION;  Surgeon: Pricilla RifflePaula Ross V, MD;  [facility];  Service: Cardiovascular;  Laterality: N/A;  . CAROTID ENDARTERECTOMY     Dr Lollie Sailsale Williams  . CRANIOTOMY Right 12/16/2016   Procedure: CRANIOTOMY HEMATOMA EVACUATION SUBDURAL;  Surgeon: Loura HaltBenjamin Jared Ditty, MD;  [facility];  Service: Neurosurgery;  Laterality: Right;  . EP IMPLANTABLE DEVICE N/A 11/06/2015   Procedure: PPM Generator Changeout;for sick sinus syndrome with a MDT Adapta L PPM, chronically elevated RV threshold.  . permanent pacemaker     MDT EnRhythm implanted by Dr Lawanda CousinsAl-Kori in [place] for complete heart block with syncope  .  REPLACEMENT TOTAL KNEE BILATERAL     2006  . TEE WITHOUT CARDIOVERSION N/A 11/14/2014   Procedure: TRANSESOPHAGEAL ECHOCARDIOGRAM (TEE);  Surgeon: Donato SchultzMark Skains, MD;  [facility];  Service: Cardiovascular;  Laterality: N/A;   HPI:  83 year old male recently d/c to CIR after right-sided subdural hematoma with seizures. He had increased lethargy and repeat CT Head 2/19 showed increased SDH. MBS 2/15 recommended full liquid diet due to primarily oral dysphagia.  PMHx: Aortic stenosis, COPD, Hx of CAD, Obesity, Obstructive sleep apnea, Afib.    Assessment / Plan / Recommendation Clinical Impression  Pt still has prolonged oral phase with pureed solids, but his overall timing and awareness seem improved since this SLP last saw him. No overt s/s of aspiration are observed. Recommend Dys 1 diet and thin liquids. Prognosis for solid advancement remains good as his mentation continuse to clear. Will follow acutely - recommend return to CIR when medically ready. SLP Visit Diagnosis: Dysphagia, oral phase (R13.11)    Aspiration Risk  Mild aspiration risk    Diet Recommendation Dysphagia 1 (Puree);Thin liquid   Liquid Administration via: Cup;Straw Medication Administration: Crushed with puree Supervision: Staff to assist with self feeding;Full supervision/cueing for compensatory strategies Compensations: Minimize environmental distractions;Slow rate;Small sips/bites;Follow solids with liquid Postural Changes: Seated upright at 90 degrees    Other  Recommendations Oral Care Recommendations: Oral care BID   Follow up Recommendations Inpatient Rehab      Frequency and Duration min 2x/week  2 weeks       Prognosis Prognosis  for Safe Diet Advancement: Good Barriers to Reach Goals: Cognitive deficits      Swallow Study   General HPI: 83 year old male recently d/c to CIR after right-sided subdural hematoma with seizures. He had increased lethargy and repeat CT Head 2/19 showed increased  SDH. MBS 2/15 recommended full liquid diet due to primarily oral dysphagia.  PMHx: Aortic stenosis, COPD, Hx of CAD, Obesity, Obstructive sleep apnea, Afib.  Type of Study: Bedside Swallow Evaluation Previous Swallow Assessment: see HPI Diet Prior to this Study: Thin liquids;Other (Comment) (full liquid) Temperature Spikes Noted: No Respiratory Status: Room air History of Recent Intubation: Yes Length of Intubations (days):  (for procedure only) Behavior/Cognition: Alert;Cooperative;Requires cueing Oral Cavity Assessment: Dry Oral Care Completed by SLP: No Self-Feeding Abilities: Able to feed self;Needs assist Patient Positioning: Upright in bed Baseline Vocal Quality: Normal    Oral/Motor/Sensory Function     Ice Chips Ice chips: Not tested   Thin Liquid Thin Liquid: Within functional limits Presentation: Straw    Nectar Thick Nectar Thick Liquid: Not tested   Honey Thick Honey Thick Liquid: Not tested   Puree Puree: Impaired Presentation: Spoon Oral Phase Functional Implications: Prolonged oral transit   Solid   GO   Solid: Not tested        Maxcine Ham 12/17/2016,12:01 PM   Maxcine Ham, M.A. CCC-SLP [phone]

## 2016-12-17 NOTE — Evaluation (Signed)
Occupational Therapy Evaluation Patient Details Name: Alex DuhamelJohn R [name] MRN: [id number] DOB: [birth year] Today's Date: 12/17/2016    History of Present Illness Pt is an 83 y.o. male s/p CRANIOTOMY HEMATOMA EVACUATION SUBDURAL (Right).    Clinical Impression   Pt from CIR; was needing assist with transfers and ADL PTA. Currently pt overall max assist for ADL due to difficulty following commands. Attempted sit to stand from EOB x3 with total assist +2; pt pushing back and unable to achieve upright position. Pt with waxing/waining cognition during session; alert and conversive at start of session with sharp decline in ability to answer questions and follow commands then back to contributing to conversation at end of session (RN notified). Recommend return to CIR to maximize independence and safety with ADL and functional mobility prior to return home. Pt would benefit from continued skilled OT to address established goals.    Follow Up Recommendations  CIR;Supervision/Assistance - 24 hour    Equipment Recommendations  Other (comment) (TBD)    Recommendations for Other Services Rehab consult     Precautions / Restrictions Precautions Precautions: Fall Restrictions Weight Bearing Restrictions: No      Mobility Bed Mobility Overal bed mobility: Needs Assistance Bed Mobility: Supine to Sit;Sit to Supine     Supine to sit: +2 for physical assistance;Max assist Sit to supine: +2 for physical assistance;Total assist      Transfers Overall transfer level: Needs assistance               General transfer comment: Attempted sit to stand from EOB x3 with total assist +2 and pt unable to acheive standing    Balance Overall balance assessment: Needs assistance Sitting-balance support: Feet supported;No upper extremity supported Sitting balance-[name]: Good                                      ADL Overall ADL's : Needs assistance/impaired                                        General ADL Comments: Pt with difficulty following commands during session and would require max assist for all ADL. Attempted sit to stand from EOB x3 with total assist +2 and pt unable to acheive full standing.     Vision         Perception     Praxis      Pertinent Vitals/Pain Pain Assessment: Faces Faces Pain Scale: No hurt     Hand Dominance     Extremity/Trunk Assessment Upper Extremity Assessment Upper Extremity Assessment: LUE deficits/detail LUE Deficits / Details: Active movement noted but pt difficulty following commands for formal testing.   Lower Extremity Assessment Lower Extremity Assessment: Defer to PT evaluation       Communication Communication Communication: No difficulties;Expressive difficulties   Cognition Arousal/Alertness: Awake/alert Behavior During Therapy: WFL for tasks assessed/performed                   General Comments: Pt alert and conversive upon therapy arrival. Once therapist began asking pt orientation questions pt with sharp decline in cognition, seemed unable to find words and difficulty answering questions and following commands. Pt remained this way until the end of session when he began joining in conversation again.   General Comments  Exercises       Shoulder Instructions      Home Living Family/patient expects to be discharged to:: Inpatient rehab                                        Prior Functioning/Environment Level of Independence: Needs assistance  Gait / Transfers Assistance Needed: assist with mobility ADL's / Homemaking Assistance Needed: assist with ADL            OT Problem List: Decreased strength;Decreased range of motion;Decreased activity tolerance;Impaired balance (sitting and/or standing);Decreased coordination;Decreased cognition;Decreased safety awareness;Decreased knowledge of use of DME or AE;Impaired sensation;Obesity;Impaired  UE functional use;Increased edema      OT Treatment/Interventions: Self-care/ADL training;Therapeutic exercise;Neuromuscular education;Energy conservation;DME and/or AE instruction;Therapeutic activities;Cognitive remediation/compensation;Patient/family education;Balance training    OT Goals(Current goals can be found in the care plan section) Acute Rehab OT Goals Patient Stated Goal: rehab then home OT Goal Formulation: With patient/family Time For Goal Achievement: 12/31/16 Potential to Achieve Goals: Good  OT Frequency: Min 3X/week   Barriers to D/C:            Co-evaluation PT/OT/SLP Co-Evaluation/Treatment: Yes Reason for Co-Treatment: Complexity of the patient's impairments (multi-system involvement);For patient/therapist safety   OT goals addressed during session: ADL's and self-care      End of Session Equipment Utilized During Treatment: Gait belt Nurse Communication: Mobility status;Other (comment) (waxing/waining cognition)  Activity Tolerance: Patient tolerated treatment well;Other (comment) (limited by confusion) Patient left: in bed;with call bell/phone within reach;with bed alarm set;with family/visitor present  OT Visit Diagnosis: Hemiplegia and hemiparesis Hemiplegia - Right/Left: Left Hemiplegia - dominant/non-dominant: Non-Dominant Hemiplegia - caused by: Other Nontraumatic intracranial hemorrhage                ADL either performed or assessed with clinical judgement  Time: 1610-9604 OT Time Calculation (min): 27 min Charges:  OT General Charges $OT Visit: 1 Procedure OT Evaluation $OT Eval Moderate Complexity: 1 Procedure G-Codes:     Alex [name], M.S., Alex [name] Pager: [phone]  Gaye Alken 12/17/2016, 5:11 PM

## 2016-12-17 NOTE — Anesthesia Postprocedure Evaluation (Addendum)
Anesthesia Post Note  Patient: Alex DuhamelJohn R [name]  Procedure(s) Performed: Procedure(s) (LRB): CRANIOTOMY HEMATOMA EVACUATION SUBDURAL (Right)  Patient location during evaluation: SICU Anesthesia Type: General Level of consciousness: sedated Pain management: pain level controlled Vital Signs Assessment: post-procedure vital signs reviewed and stable Respiratory status: respiratory function stable and spontaneous breathing Cardiovascular status: stable Anesthetic complications: no                     Cyntia Staley DANIEL

## 2016-12-18 ENCOUNTER — Other Ambulatory Visit (HOSPITAL_COMMUNITY): Payer: Self-pay | Admitting: Nurse Practitioner

## 2016-12-18 MED ORDER — METHYLPREDNISOLONE 4 MG PO TBPK: ORAL_TABLET | ORAL | 0 refills | Status: DC

## 2016-12-18 MED ORDER — LEVETIRACETAM 500 MG PO TABS: 500.0000 mg | ORAL_TABLET | Freq: Two times a day (BID) | ORAL | 0 refills | Status: DC

## 2016-12-18 NOTE — Evaluation (Signed)
Speech Language Pathology Evaluation Patient Details Name: Alex [name] MRN: [id number] DOB: [birth year] Today's Date: 12/18/2016 Time: 6578-4696 SLP Time Calculation (min) (ACUTE ONLY): 7 min  Problem List:  Patient Active Problem List   Diagnosis Date Noted  . Acute pulmonary edema (HCC) 12/12/2016  . Acute diastolic CHF (congestive heart failure) (HCC) 12/12/2016  . Atrial flutter (HCC) 12/12/2016  . Traumatic subdural bleed with LOC of 1 hour to 5 hours 59 minutes (HCC) 12/11/2016  . Class 1 obesity due to excess calories with body mass index (BMI) of 30.0 to 30.9 in adult   . ETOH abuse   . Chronic obstructive pulmonary disease (HCC)   . Supplemental oxygen dependent   . Alcohol use   . OSA on CPAP   . Atrial fibrillation (HCC)   . Coronary artery disease involving coronary bypass graft of native heart without angina pectoris   . Seizures (HCC)   . Dysphagia   . Bradycardia   . Hyperglycemia   . Agitation   . Hypokalemia   . Hypernatremia   . Leukocytosis   . Macrocytic anemia   . Thrombocytopenia (HCC)   . Traumatic subdural hematoma without loss of consciousness (HCC)   . Change in mental status   . Subdural hematoma (HCC) 12/03/2016  . Sick sinus syndrome (HCC) 11/06/2015  . PAF (paroxysmal atrial fibrillation) (HCC) 11/14/2014  . SOB (shortness of breath) 10/31/2014  . Persistent atrial fibrillation (HCC) 10/31/2014  . Aortic stenosis, severe 07/28/2011  . 3-vessel coronary artery disease 07/28/2011  . Hyperlipidemia, mixed 07/28/2011   Past Medical History:  Past Medical History:  Diagnosis Date  . Aortic stenosis    s/p AVR by Dr Laneta Simmers  . COPD (chronic obstructive pulmonary disease) (HCC)   . History of coronary artery disease    status post stenting of the marginal circumflex in 12/2003 and again in 2009  . Hyperlipidemia   . Morbid obesity (HCC)    weight 243 pounds, BMI 31.2kg/m2, BSA 2.36 square meters  . Obstructive sleep apnea    compliant with  CPAP  . Persistent atrial fibrillation [facility])    Past Surgical History:  Past Surgical History:  Procedure Laterality Date  . AORTIC VALVE REPLACEMENT (AVR)/CORONARY ARTERY BYPASS GRAFTING (CABG)   08/05/2011   LIMA to LAD, sequential saphenous vein graft to third and fourth obtuse marginal branches of the circumflex, aortic valve replacement using a 23 mm Edwards pericardial valve  . APPENDECTOMY    . CARDIOVERSION N/A 11/14/2014   Procedure: CARDIOVERSION;  Surgeon: Donato Schultz, MD;  [facility] ENDOSCOPY;  Service: Cardiovascular;  Laterality: N/A;  . CARDIOVERSION N/A 11/20/2016   Procedure: CARDIOVERSION;  Surgeon: Pricilla Riffle, MD;  [facility] ENDOSCOPY;  Service: Cardiovascular;  Laterality: N/A;  . CAROTID ENDARTERECTOMY     Dr Lollie Sails  . CRANIOTOMY Right 12/16/2016   Procedure: CRANIOTOMY HEMATOMA EVACUATION SUBDURAL;  Surgeon: Loura Halt Ditty, MD;  Location: [facility] OR;  Service: Neurosurgery;  Laterality: Right;  . EP IMPLANTABLE DEVICE N/A 11/06/2015   Procedure: PPM Generator Changeout;for sick sinus syndrome with a MDT Adapta L PPM, chronically elevated RV threshold.  . permanent pacemaker     MDT EnRhythm implanted by Dr Lawanda Cousins in [place] for complete heart block with syncope  . REPLACEMENT TOTAL KNEE BILATERAL     2006  . TEE WITHOUT CARDIOVERSION N/A 11/14/2014   Procedure: TRANSESOPHAGEAL ECHOCARDIOGRAM (TEE);  Surgeon: Donato Schultz, MD;  Location: [facility] ENDOSCOPY;  Service: Cardiovascular;  Laterality: N/A;  HPI:  83 year old male recently d/c to CIR after right-sided subdural hematoma with seizures. He had increased lethargy and repeat CT Head 2/19 showed increased SDH, now s/p craniotomy. MBS 2/15 recommended full liquid diet due to primarily oral dysphagia. PMHx: Aortic stenosis, COPD, Hx of CAD, Obesity, Obstructive sleep apnea, Afib.    Assessment / Plan / Recommendation Clinical Impression  Pt is oriented to person only and has fleeting sustained  attention. Even with Max cues and binary choices, he cannot reorient himself or answer simple questions. Mod cues provided to follow one-step commands. Family present is concerned that he is more confused today than on previous date - RN made aware. SLP will continue to follow to maximize functional cognition.    SLP Assessment  SLP Recommendation/Assessment: Patient needs continued Speech Lanaguage Pathology Services SLP Visit Diagnosis: Attention and concentration deficit Attention and concentration deficit following: Other cerebrovascular disease    Follow Up Recommendations  Inpatient Rehab    Frequency and Duration min 2x/week  2 weeks      SLP Evaluation Cognition  Overall Cognitive Status: Impaired/Different from baseline Arousal/Alertness: Lethargic Orientation Level: Disoriented to place;Disoriented to time;Disoriented to situation Attention: Sustained Sustained Attention: Impaired Sustained Attention Impairment: Verbal basic;Functional basic Memory: Impaired Memory Impairment: Decreased recall of new information Awareness: Impaired Awareness Impairment: Intellectual impairment Problem Solving: Impaired Problem Solving Impairment: Verbal basic;Functional basic Safety/Judgment: Impaired       Comprehension  Auditory Comprehension Overall Auditory Comprehension: Impaired Commands: Impaired One Step Basic Commands: 50-74% accurate    Expression Expression Primary Mode of Expression: Verbal Verbal Expression Overall Verbal Expression: Impaired Level of Generative/Spontaneous Verbalization: Phrase Non-Verbal Means of Communication: Not applicable Other Verbal Expression Comments: pt with minimal functional output, does not say much and does not always respond to questions   Oral / Motor      GO                    Maxcine Hamaiewonsky, Taron Conrey 12/18/2016, 12:14 PM   Maxcine HamLaura Paiewonsky, M.A. CCC-SLP [phone]

## 2016-12-18 NOTE — Care Management Note (Signed)
Case Management Note  Patient Details  Name: Alex [name] MRN: [id number] Date of Birth: 12/13/1930  Subjective/Objective:   Pt readmitted from CIR. Prior he was home with his wife. He is s/p craniotomy on the 20th for subdural hematoma.                  Action/Plan: PT/OT recommending CIR when medically ready. CM following for d/c disposition.   Expected Discharge Date:                  Expected Discharge Plan:  IP Rehab Facility  In-House Referral:     Discharge planning Services     Post Acute Care Choice:    Choice offered to:     DME Arranged:    DME Agency:     HH Arranged:    HH Agency:     Status of Service:  In process, will continue to follow  If discussed at Long Length of Stay Meetings, dates discussed:    Additional Comments:  Alex BaloKelli F Zyshonne Malecha, RN 12/18/2016, 12:22 PM

## 2016-12-18 NOTE — Progress Notes (Signed)
I met with wife at bedside. I will begin insurance authorization for a possible inpt rehab admission pending insurance approval. [phone]

## 2016-12-18 NOTE — Discharge Summary (Addendum)
Date of Admission: 12/15/2016  Date of Discharge: 12/18/16  PRE-OPERATIVE DIAGNOSIS:  Chronic subdural hematoma  POST-OPERATIVE DIAGNOSIS:  Same  PROCEDURE:  Right frontotemporoparietal craniotomy for evacuation of subdural hematoma  Attending: Truitt MerleBenjamin Jared Ditty, MD  Hospital Course:  The patient was admitted for the above listed operation and had an uncomplicated post-operative course.  They were discharged in stable condition to CIR. He was able to move all extremities. CN grossly intact. Tolerating po. Normal output. Incision without signs of infection  Follow up: 1-2 weeks for staple removal  Allergies as of 12/18/2016   No Known Allergies     Medication List    TAKE these medications   ALPRAZolam 0.25 MG tablet Commonly known as:  XANAX Take 0.25 mg by mouth at bedtime.   amiodarone 200 MG tablet Commonly known as:  PACERONE Take 1 tablet (200 mg total) by mouth 2 (two) times daily.   aspirin 81 MG tablet Take 81 mg by mouth daily.   atorvastatin 80 MG tablet Commonly known as:  LIPITOR TAKE 1 TABLET BY MOUTH DAILY   escitalopram 10 MG tablet Commonly known as:  LEXAPRO Take 10 mg by mouth at bedtime.   furosemide 20 MG tablet Commonly known as:  LASIX Take 1 tablet (20mg ) by mouth daily. May take extra tablet for weight gain >3lbs   HYDROcodone-acetaminophen 5-325 MG tablet Commonly known as:  NORCO/VICODIN Take 1 tablet by mouth 2 (two) times daily as needed for moderate pain.   ibuprofen 200 MG tablet Commonly known as:  ADVIL,MOTRIN Take 400 mg by mouth daily as needed for moderate pain.   levETIRAcetam 500 MG tablet Commonly known as:  KEPPRA Take 1 tablet (500 mg total) by mouth 2 (two) times daily.   Magnesium 250 MG Tabs Take 1 tablet (250 mg total) by mouth daily.   methylPREDNISolone 4 MG Tbpk tablet Commonly known as:  MEDROL DOSEPAK Take according to package inserts.   metoprolol succinate 25 MG 24 hr tablet Commonly known as:   TOPROL-XL Take 12.5 mg by mouth 2 (two) times daily.   potassium chloride 10 MEQ tablet Commonly known as:  K-DUR Take 1 tablet (10meq) by mouth daily. Take extra 1 tablet if taking extra dose of lasix   VITAMIN B COMPLEX PO Take 1 tablet by mouth daily.   zolpidem 10 MG tablet Commonly known as:  AMBIEN Take 5-10 mg by mouth at bedtime.

## 2016-12-18 NOTE — Progress Notes (Addendum)
Pt seen and examined. No issues overnight. Wife reports he has been very interactive. Does not appear to be complaining of anything. No slurring speech or facial droop. Eating and drinking well.   EXAM: Temp:  [97.4 F (36.3 C)-98.1 F (36.7 C)] 97.4 F (36.3 C) (02/22 1027) Pulse Rate:  [51-69] 62 (02/22 1027) Resp:  [16-19] 16 (02/22 1027) BP: (107-159)/(52-93) 121/62 (02/22 1027) SpO2:  [93 %-99 %] 93 % (02/22 1027) Intake/Output      02/21 0701 - 02/22 0700 02/22 0701 - 02/23 0700   I.V. (mL/kg) 60 (0.6)    IV Piggyback     Total Intake(mL/kg) 60 (0.6)    Urine (mL/kg/hr) 800 (0.3)    Stool     Blood     Total Output 800     Net -740           Awake and alert Follows commands throughout Moving all extremities Wound is without drainage or signs of infection.  Stable Continue current care Will d/c and send back to CIR

## 2016-12-18 NOTE — Progress Notes (Signed)
  Speech Language Pathology Treatment: Dysphagia  Patient Details Name: Alex DuhamelJohn R Aument MRN: [id number] DOB: [birth year] Today's Date: 12/18/2016 Time: [id number] SLP Time Calculation (min) (ACUTE ONLY): 8 min  Assessment / Plan / Recommendation Clinical Impression  Pt consumed ice cream and thin liquids with delayed cough observed x2 following bite of ice cream. Cough also noted prior to POs administered. SLP challenged pt with large, consecutive boluses of thin liquids by straw with no cough elicited. Mod cueing provided during intake mostly for sustained attention to task. Recommend to continue with current diet for now.   HPI HPI: 83 year old male recently d/c to CIR after right-sided subdural hematoma with seizures. He had increased lethargy and repeat CT Head 2/19 showed increased SDH. MBS 2/15 recommended full liquid diet due to primarily oral dysphagia.  PMHx: Aortic stenosis, COPD, Hx of CAD, Obesity, Obstructive sleep apnea, Afib.       SLP Plan  Continue with current plan of care       Recommendations  Diet recommendations: Dysphagia 1 (puree);Thin liquid Liquids provided via: Cup;Straw Medication Administration: Crushed with puree Supervision: Full supervision/cueing for compensatory strategies;Staff to assist with self feeding;Patient able to self feed Compensations: Minimize environmental distractions;Slow rate;Small sips/bites;Follow solids with liquid Postural Changes and/or Swallow Maneuvers: Seated upright 90 degrees                Oral Care Recommendations: Oral care BID Follow up Recommendations: Inpatient Rehab SLP Visit Diagnosis: Dysphagia, oral phase (R13.11) Plan: Continue with current plan of care       GO                Maxcine Hamaiewonsky, Mercedes Fort 12/18/2016, 12:06 PM  Maxcine HamLaura Paiewonsky, M.A. CCC-SLP [phone]

## 2016-12-18 NOTE — H&P (Signed)
Physical Medicine and Rehabilitation Admission H&P    Chief complaint: Weakness  HPI: Alex [name] a 83 y.o.right handed malewith history of morbid obesity, alcohol use, COPD with nighttime oxygen, obstructive sleep apnea with CPAP, atrial fibrillation/AVR/CABG/PPM maintained on aspirin and Coumadin. Per chart review and wife, patient lives with spouse independent prior to admission. One level home with 3 steps to entry. Presented 12/03/2016 after a recent fall while in the yard and struck his head without loss of consciousness. Develop dry heaving gradual onset of moderate squeezing chest pain shortness of breath. CT of the head showed left parietal scalp hematoma. Acute right hemispheric subdural hematoma with maximal thickness in the right posterior parietal region measuring 15 mm. Right to left shift of 7.5 mm. Troponin negative. INR of 4.14 that was reversed. Neurosurgery Dr. Sherley Bounds advised conservative care. Hospital course confusion with delirium he had been started on Precedex. Soft restraints were added for patient safety. Reported grand mal seizure 12/04/2016 required intubation and currently maintained on Keppra. EEG showed occasional low amplitude epileptiform discharges over the right posterior temporal region 3 clinicoelectrographic seizures captured arising from the right hemisphere lasting 2-4 minutes. Patient remains off anticoagulation due to subdural hematoma. Patient initially NPO with nasogastric tube for nutritional support however patient did pull his nasogastric tube out. A swallow study 12/11/2016 placed on a full liquid diet.. Latest cranial CT scan 12/10/2016 reviewed, showingstable right subdural hematoma with stable mass effect. Per report, no new large territory infarct or parenchymal hemorrhage identified. Physical and occupational therapy evaluations completed with recommendations of physical medicine rehabilitation consult.Patient was admitted for a  comprehensive rehabilitation program 12/10/2016. Patient was slow progressive gains. Cardiology consulted 12/12/2016 with increasing shortness of breath, tachycardia and wheezing. Chest x-ray consistent with pulmonary edema with small-to-moderate posterior pleural effusion. He did receive intravenous Lasix. His amiodarone was adjusted and Lopressor was titrated due to tachycardia. Patient continued on his full liquid diet. 12/15/2016 patient more lethargic and difficult to arouse. Follow-up CT of the head showed interval increase in size of large broad-based right sided subdural hematoma now measuring 21.5 mm maximal thickness versus 9 mm. Increased mass effect upon the right lateral ventricle with midline shift to the left by 14.4 mm versus prior 7.4. Neurosurgery was consulted and patient was discharged to acute care services 12/15/2016 for ongoing monitoring in guarded condition. Underwent right frontotemporal parietal craniotomy for evacuation of subdural hematoma 12/16/2016 per Dr. Cyndy Freeze. Maintained on Keppra for seizure prophylaxis as well as Decadron protocol. His low-dose aspirin was resumed for history of atrial fibrillation but no Coumadin at this time due to SDH. Mild hypokalemia with supplement added. Physical and occupational therapy evaluations resumed. Patient has been readmitted back to inpatient rehabilitation services for ongoing comprehensive therapies  Review of Systems  Constitutional: Negative for fever.  HENT: Negative for ear pain.   Eyes: Negative for blurred vision.  Respiratory: Negative for cough and shortness of breath.   Cardiovascular: Negative for chest pain.  Gastrointestinal: Negative for nausea and vomiting.  Genitourinary: Negative for flank pain.  Musculoskeletal: Negative for myalgias.  Skin: Negative for rash.  Neurological: Positive for focal weakness and headaches.  Psychiatric/Behavioral: Negative for depression.   Past Medical History:  Diagnosis Date  .  Aortic stenosis    s/p AVR by Dr Cyndia Bent  . COPD (chronic obstructive pulmonary disease) ([place])   . History of coronary artery disease    status post stenting of the marginal circumflex in 12/2003 and again in 2009  .  Hyperlipidemia   . Morbid obesity (HCC)    weight 243 pounds, BMI 31.2kg/m2, BSA 2.36 square meters  . Obstructive sleep apnea    compliant with CPAP  . Persistent atrial fibrillation [facility])    Past Surgical History:  Procedure Laterality Date  . AORTIC VALVE REPLACEMENT (AVR)/CORONARY ARTERY BYPASS GRAFTING (CABG)   08/05/2011   LIMA to LAD, sequential saphenous vein graft to third and fourth obtuse marginal branches of the circumflex, aortic valve replacement using a 23 mm Edwards pericardial valve  . APPENDECTOMY    . CARDIOVERSION N/A 11/14/2014   Procedure: CARDIOVERSION;  Surgeon: Candee Furbish, MD;  Location: [facility] ENDOSCOPY;  Service: Cardiovascular;  Laterality: N/A;  . CARDIOVERSION N/A 11/20/2016   Procedure: CARDIOVERSION;  Surgeon: Fay Records, MD;  Location: [place];  Service: Cardiovascular;  Laterality: N/A;  . CAROTID ENDARTERECTOMY     Dr Levi Aland  . CRANIOTOMY Right 12/16/2016   Procedure: CRANIOTOMY HEMATOMA EVACUATION SUBDURAL;  Surgeon: Kevan Ny Ditty, MD;  Location: [place];  Service: Neurosurgery;  Laterality: Right;  . EP IMPLANTABLE DEVICE N/A 11/06/2015   Procedure: PPM Generator Changeout;for sick sinus syndrome with a MDT Adapta L PPM, chronically elevated RV threshold.  . permanent pacemaker     MDT EnRhythm implanted by Dr Sherilyn Banker in [place] for complete heart block with syncope  . REPLACEMENT TOTAL KNEE BILATERAL     2006  . TEE WITHOUT CARDIOVERSION N/A 11/14/2014   Procedure: TRANSESOPHAGEAL ECHOCARDIOGRAM (TEE);  Surgeon: Candee Furbish, MD;  Location: [facility] ENDOSCOPY;  Service: Cardiovascular;  Laterality: N/A;   Family History  Problem Relation Age of Onset  . Alzheimer's disease Mother   . Tuberculosis Father   . Other Father      Spinal Meningitis   Social History:  reports that he quit smoking about 13 years ago. His smoking use included Cigarettes. He started smoking about 43 years ago. He has a 15.00 pack-year smoking history. He has never used smokeless tobacco. He reports that he drinks alcohol. He reports that he does not use drugs. Allergies: No Known Allergies Medications Prior to Admission  Medication Sig Dispense Refill  . ALPRAZolam (XANAX) 0.25 MG tablet Take 0.25 mg by mouth at bedtime.    Marland Kitchen amiodarone (PACERONE) 200 MG tablet Take 1 tablet (200 mg total) by mouth 2 (two) times daily. 60 tablet 1  . aspirin 81 MG tablet Take 81 mg by mouth daily.    Marland Kitchen atorvastatin (LIPITOR) 80 MG tablet TAKE 1 TABLET BY MOUTH DAILY 90 tablet 3  . B Complex Vitamins (VITAMIN B COMPLEX PO) Take 1 tablet by mouth daily.     Marland Kitchen escitalopram (LEXAPRO) 10 MG tablet Take 10 mg by mouth at bedtime.    . furosemide (LASIX) 20 MG tablet Take 1 tablet (80m) by mouth daily. May take extra tablet for weight gain >3lbs 45 tablet 6  . HYDROcodone-acetaminophen (NORCO/VICODIN) 5-325 MG tablet Take 1 tablet by mouth 2 (two) times daily as needed for moderate pain.     .Marland Kitchenibuprofen (ADVIL,MOTRIN) 200 MG tablet Take 400 mg by mouth daily as needed for moderate pain.    . Magnesium 250 MG TABS Take 1 tablet (250 mg total) by mouth daily.  0  . metoprolol succinate (TOPROL-XL) 25 MG 24 hr tablet Take 12.5 mg by mouth 2 (two) times daily.     . potassium chloride (K-DUR) 10 MEQ tablet Take 1 tablet (177m) by mouth daily. Take extra 1 tablet if taking extra dose  of lasix 45 tablet 3  . zolpidem (AMBIEN) 10 MG tablet Take 5-10 mg by mouth at bedtime.       Home: Home Living Family/patient expects to be discharged to:: Inpatient rehab Living Arrangements: Spouse/significant other Available Help at Discharge: Family, Available 24 hours/day Type of Home: House  Lives With: Spouse   Functional History: Prior Function Level of Independence:  Needs assistance Gait / Transfers Assistance Needed: pt working on transfers with assist in [place] / Homemaking Assistance Needed: assist with ADL  Functional Status:  Mobility: Bed Mobility Overal bed mobility: Needs Assistance Bed Mobility: Supine to Sit Supine to sit: Min assist Sit to supine: +2 for physical assistance, Total assist General bed mobility comments: Pt required increased time with + use of railing and elevated HOB.  Min assist for LE advancement and elevation of trunk to edge of bed.   Transfers Overall transfer level: Needs assistance Equipment used: Rolling walker (2 wheeled) Transfers: Sit to/from Stand, [employer] Transfers Sit to Stand: Mod assist, +2 physical assistance Stand pivot transfers: Max assist, +2 physical assistance (Pt begins to sit impulsively after two steps towards the chair.  ) General transfer comment: x2 standing trials at RW, significant weakness and coordination deficits on LUE.  Pt required cues to push from bed and extend hips, knees, trunk and head.  Pt returned to sitting on 1st trial to use the urinal.  On second attempt patient able to take steps to chair but began to sit impulsively.   Ambulation/Gait Ambulation/Gait assistance: Max assist, +2 physical assistance Ambulation Distance (Feet):  (steps from bed to chair.  ) Assistive device: Rolling walker (2 wheeled) Gait Pattern/deviations: Shuffle, Staggering right General Gait Details: Pt performed increased mobility advancing steps from bed to chair.  Pt remains to present with poor cognition.  Upon advancing steps from bed to chair patient began to sit slowly requiring cues to remain upright.   Gait velocity interpretation: Below normal speed for age/gender    ADL: ADL Overall ADL's : Needs assistance/impaired General ADL Comments: Pt with difficulty following commands during session and would require max assist for all ADL. Attempted sit to stand from EOB x3 with total assist +2  and pt unable to acheive full standing.  Cognition: Cognition Overall Cognitive Status: Impaired/Different from baseline Arousal/Alertness: Lethargic Orientation Level: Disoriented to place, Disoriented to time, Disoriented to situation Attention: Sustained Sustained Attention: Impaired Sustained Attention Impairment: Verbal basic, Functional basic Memory: Impaired Memory Impairment: Decreased recall of new information Awareness: Impaired Awareness Impairment: Intellectual impairment Problem Solving: Impaired Problem Solving Impairment: Verbal basic, Functional basic Safety/Judgment: Impaired Cognition Arousal/Alertness: Awake/alert Behavior During Therapy: WFL for tasks assessed/performed Overall Cognitive Status: Impaired/Different from baseline Area of Impairment: Problem solving, Safety/judgement, Following commands Current Attention Level: Selective Following Commands: Follows one step commands with increased time, Follows one step commands inconsistently Safety/Judgement: Decreased awareness of safety, Decreased awareness of deficits Problem Solving: Slow processing, Decreased initiation, Requires verbal cues, Requires tactile cues General Comments: Pt impulsively starting to sit when transferring from bed to chair.    Physical Exam: Blood pressure 121/62, pulse 62, temperature 97.4 F (36.3 C), temperature source Oral, resp. rate 16, height 6' 2"  (1.88 m), weight 103.2 kg (227 lb 8.2 oz), SpO2 93 %. Physical Exam  Constitutional: No distress.  HENT:  Clips in place from cranio.Clean and dry  Eyes: EOM are normal. Left eye exhibits no discharge.  Neck: Normal range of motion. Neck supple. No tracheal deviation present. No thyromegaly present.  Cardiovascular:  Murmur heard. IRR and IRR  Respiratory: Effort normal and breath sounds normal. No respiratory distress. He has no wheezes. He has no rales.  GI: Soft. Bowel sounds are normal. He exhibits no distension. There is  no tenderness. There is no rebound.  Skin: He is not diaphoretic.  Neurological.Alert and makes good eye contact. Knows he's at [facility]. Knew he injured his head. Recalled the month was January. Otherwise tangential and confabulating at times. REcognized wife and remembered me from initial stay. No focal field cuts. Speech clear. UE grossly 4/5 prox to distal. LE: 3/5 HF, KE and 4/5 ADF/PF. Senses pain in all 4's. DTR's 2+.  Psych: pleasant and cooperative but distracted.   Results for orders placed or performed during the hospital encounter of 12/15/16 (from the past 48 hour(s))  Basic metabolic panel     Status: Abnormal   Collection Time: 12/16/16  5:00 PM  Result Value Ref Range   Sodium 145 135 - 145 mmol/L   Potassium 3.1 (L) 3.5 - 5.1 mmol/L   Chloride 108 101 - 111 mmol/L   CO2 29 22 - 32 mmol/L   Glucose, Bld 115 (H) 65 - 99 mg/dL   BUN 15 6 - 20 mg/dL   Creatinine, Ser 0.65 0.61 - 1.24 mg/dL   Calcium 8.0 (L) 8.9 - 10.3 mg/dL   GFR calc non Af Amer >60 >60 mL/min   GFR calc Af Amer >60 >60 mL/min    Comment: (NOTE) The eGFR has been calculated using the CKD EPI equation. This calculation has not been validated in all clinical situations. eGFR's persistently <60 mL/min signify possible Chronic Kidney Disease.    Anion gap 8 5 - 15  CBC     Status: Abnormal   Collection Time: 12/16/16  5:00 PM  Result Value Ref Range   WBC 11.5 (H) 4.0 - 10.5 K/uL   RBC 3.53 (L) 4.22 - 5.81 MIL/uL   Hemoglobin 11.6 (L) 13.0 - 17.0 g/dL   HCT 36.4 (L) 39.0 - 52.0 %   MCV 103.1 (H) 78.0 - 100.0 fL   MCH 32.9 26.0 - 34.0 pg   MCHC 31.9 30.0 - 36.0 g/dL   RDW 13.4 11.5 - 15.5 %   Platelets 190 150 - 400 K/uL   Ct Head Wo Contrast  Result Date: 12/17/2016 CLINICAL DATA:  Continued surveillance subdural hematoma. EXAM: CT HEAD WITHOUT CONTRAST TECHNIQUE: Contiguous axial images were obtained from the base of the skull through the vertex without intravenous contrast. COMPARISON:   12/15/2016. FINDINGS: Brain: The patient has undergone drainage of a subacute RIGHT subdural hematoma. Large RIGHT hemisphere craniotomy flap. No subdural drain was placed. There is extensive pneumocephalus. Residual subdural fluid, mixed attenuation, up to 14 mm thick. Residual 9 mm of RIGHT-to-LEFT shift. Some associated subdural hemorrhage. Generalized atrophy. Chronic microvascular ischemic change. No parenchymal hematoma. Vascular: Advanced vascular calcification in the carotid siphons. No hyperdense proximal vessel. Skull: Craniotomy flap appears well seated.  No fracture. Sinuses/Orbits: No layering fluid.  BILATERAL cataract extraction. Other: None. IMPRESSION: Significant residual RIGHT subdural hematoma, up to 14 mm thick. Extensive pneumocephalus. 9 mm of RIGHT-to-LEFT shift is improved but warrants continued surveillance. Electronically Signed   By: Staci Righter M.D.   On: 12/17/2016 06:51       Medical Problem List and Plan: 1.  Decreased functional mobility with altered mental status secondary to traumatic subdural hematoma status post right frontotemporal parietal craniotomy 12/16/2016  -readmitting to inpatient rehab today. Pt's  arousal much improved 2.  DVT Prophylaxis/Anticoagulation: SCDs. Monitor for any signs of DVT 3. Pain Management: Hydrocodone as needed 4. Mood: Xanax 0.25 mg daily at bedtime, Lexapro 10 mg daily 5. Neuropsych: This patient is capable of making decisions on his own behalf. 6. Skin/Wound Care: Routine skin checks 7. Fluids/Electrolytes/Nutrition: Routine I&O with follow-up chemistries 8. Seizure prophylaxis. Keppra 500 mg every 12 hours 9. Atrial fibrillation/AVR/CABG/PPM. Continue low-dose aspirin. Amiodarone 200 mg twice a day, Toprol 12.5 mg twice a day. Follow-up per cardiology services 10. Dysphagia. Dysphagia #1 thin liquid diet. Follow-up speech therapy 11. Acute diastolic congestive heart failure. Lasix 20 mg daily. Monitor for any fluid  overload 12. Hypokalemia. Follow-up chemistries 13. History of COPD. Check oxygen saturations every shift 14. History of alcohol abuse. Monitor for withdrawal. Provide counseling as appropriate 15. Constipation. Laxative assistance  Post Admission Physician Evaluation: 1. Functional deficits secondary  to SDH sp craniotomy. 2. Patient is admitted to receive collaborative, interdisciplinary care between the physiatrist, rehab nursing staff, and therapy team. 3. Patient's level of medical complexity and substantial therapy needs in context of that medical necessity cannot be provided at a lesser intensity of care such as a SNF. 4. Patient has experienced substantial functional loss from his/her baseline which was documented above under the "Functional History" and "Functional Status" headings.  Judging by the patient's diagnosis, physical exam, and functional history, the patient has potential for functional progress which will result in measurable gains while on inpatient rehab.  These gains will be of substantial and practical use upon discharge  in facilitating mobility and self-care at the household level. 5. Physiatrist will provide 24 hour management of medical needs as well as oversight of the therapy plan/treatment and provide guidance as appropriate regarding the interaction of the two. 6. The Preadmission Screening has been reviewed and patient status is unchanged unless otherwise stated above. 7. 24 hour rehab nursing will assist with bladder management, bowel management, safety, skin/wound care, disease management, medication administration, pain management and patient education  and help integrate therapy concepts, techniques,education, etc. 8. PT will assess and treat for/with: Lower extremity strength, range of motion, stamina, balance, functional mobility, safety, adaptive techniques and equipment, cognitive perceptual awareness, family education.   Goals are: supervision. 9. OT will  assess and treat for/with: ADL's, functional mobility, safety, upper extremity strength, adaptive techniques and equipment, NMR, cognitve perceptual awareness, family education.   Goals are: supervision to min assist. Therapy may not yet proceed with showering this patient. 10. SLP will assess and treat for/with: cognition, communication, swallowing.  Goals are: mod I with swallowing and supervision to min assist with cognition. 11. Case Management and Social Worker will assess and treat for psychological issues and discharge planning. 12. Team conference will be held weekly to assess progress toward goals and to determine barriers to discharge. 13. Patient will receive at least 3 hours of therapy per day at least 5 days per week. 14. ELOS: 18-24 days       15. Prognosis:  excellent     Meredith Staggers, MD, [facility] 12/19/2016  Cathlyn Parsons., PA-C 12/18/2016

## 2016-12-18 NOTE — Progress Notes (Signed)
Physical Therapy Treatment Patient Details Name: Alex [name] MRN: [id number] DOB: 1931-01-15 Today's Date: 12/18/2016    History of Present Illness Pt is an 83 y.o. male s/p CRANIOTOMY HEMATOMA EVACUATION SUBDURAL (Right).     PT Comments    Pt performed increased mobility progressing from bed to chair with steps and use of RW.  Pt is now requiring decreased assistance to edge of bed but for transfers he is requiring +2 external assistance.   Plan for CIR remains appropriate.  Plan next session for standing and advancement of gait.   Follow Up Recommendations  CIR     Equipment Recommendations  None recommended by PT    Recommendations for Other Services       Precautions / Restrictions Precautions Precautions: Fall Precaution Comments: monitor HR/ BP; weeping edema Restrictions Weight Bearing Restrictions: No    Mobility  Bed Mobility Overal bed mobility: Needs Assistance Bed Mobility: Supine to Sit     Supine to sit: Min assist     General bed mobility comments: Pt required increased time with + use of railing and elevated HOB.  Min assist for LE advancement and elevation of trunk to edge of bed.    Transfers Overall transfer level: Needs assistance Equipment used: Rolling walker (2 wheeled) Transfers: Sit to/from [employer] Sit to Stand: Mod assist;+2 physical assistance Stand pivot transfers: Max assist;+2 physical assistance (Pt begins to sit impulsively after two steps towards the chair.  )       General transfer comment: x2 standing trials at RW, significant weakness and coordination deficits on LUE.  Pt required cues to push from bed and extend hips, knees, trunk and head.  Pt returned to sitting on 1st trial to use the urinal.  On second attempt patient able to take steps to chair but began to sit impulsively.    Ambulation/Gait Ambulation/Gait assistance: Max assist;+2 physical assistance Ambulation Distance (Feet):  (steps from bed  to chair.  ) Assistive device: Rolling walker (2 wheeled) Gait Pattern/deviations: Shuffle;Staggering right   Gait velocity interpretation: Below normal speed for age/gender General Gait Details: Pt performed increased mobility advancing steps from bed to chair.  Pt remains to present with poor cognition.  Upon advancing steps from bed to chair patient began to sit slowly requiring cues to remain upright.     Stairs            Wheelchair Mobility    Modified Rankin (Stroke Patients Only) Modified Rankin (Stroke Patients Only) Pre-Morbid Rankin Score: No symptoms Modified Rankin: Moderately severe disability     Balance Overall balance assessment: Needs assistance   Sitting balance-[name]: Good       Standing balance-[name]: Poor                      Cognition Arousal/Alertness: Awake/alert Behavior During Therapy: WFL for tasks assessed/performed Overall Cognitive Status: Impaired/Different from baseline Area of Impairment: Problem solving;Safety/judgement;Following commands   Current Attention Level: Selective   Following Commands: Follows one step commands with increased time;Follows one step commands inconsistently Safety/Judgement: Decreased awareness of safety;Decreased awareness of deficits   Problem Solving: Slow processing;Decreased initiation;Requires verbal cues;Requires tactile cues General Comments: Pt impulsively starting to sit when transferring from bed to chair.      Exercises      General Comments        Pertinent Vitals/Pain Pain Assessment: No/denies pain Faces Pain Scale: No hurt    Home Living  Prior Function            PT Goals (current goals can now be found in the care plan section) Acute Rehab PT Goals Patient Stated Goal: rehab then home Potential to Achieve Goals: Fair Progress towards PT goals: Progressing toward goals    Frequency    Min 3X/week      PT Plan  Current plan remains appropriate    Co-evaluation             End of Session Equipment Utilized During Treatment: Gait belt Activity Tolerance: Patient tolerated treatment well Patient left: with call bell/phone within reach;with family/visitor present;with chair alarm set;in chair Nurse Communication: Mobility status PT Visit Diagnosis: Muscle weakness (generalized) (M62.81);Other abnormalities of gait and mobility (R26.89)     Time: [id number] PT Time Calculation (min) (ACUTE ONLY): 21 min  Charges:  $Therapeutic Activity: 8-22 mins                    G Codes:       Florestine Aversimee J Shikita Vaillancourt 12/18/2016, 10:00 AM Joycelyn RuaAimee Breona Cherubin, PTA pager [phone]

## 2016-12-19 ENCOUNTER — Inpatient Hospital Stay (HOSPITAL_COMMUNITY)
Admission: RE | Admit: 2016-12-19 | Discharge: 2017-01-10 | DRG: 949 | Disposition: A | Discharge status: Home Health | Payer: Medicare Other | Source: Intra-hospital | Attending: Physical Medicine & Rehabilitation | Admitting: Physical Medicine & Rehabilitation

## 2016-12-19 ENCOUNTER — Encounter: Payer: Medicare Other | Admitting: Internal Medicine

## 2016-12-19 ENCOUNTER — Encounter (HOSPITAL_COMMUNITY): Payer: Self-pay | Admitting: *Deleted

## 2016-12-19 DIAGNOSIS — Z953 Presence of xenogenic heart valve: Secondary | ICD-10-CM | POA: Diagnosis not present

## 2016-12-19 DIAGNOSIS — Z87891 Personal history of nicotine dependence: Secondary | ICD-10-CM

## 2016-12-19 DIAGNOSIS — I481 Persistent atrial fibrillation: Secondary | ICD-10-CM | POA: Diagnosis not present

## 2016-12-19 DIAGNOSIS — R740 Nonspecific elevation of levels of transaminase and lactic acid dehydrogenase [LDH]: Secondary | ICD-10-CM

## 2016-12-19 DIAGNOSIS — B961 Klebsiella pneumoniae [K. pneumoniae] as the cause of diseases classified elsewhere: Secondary | ICD-10-CM

## 2016-12-19 DIAGNOSIS — D539 Nutritional anemia, unspecified: Secondary | ICD-10-CM

## 2016-12-19 DIAGNOSIS — Z950 Presence of cardiac pacemaker: Secondary | ICD-10-CM

## 2016-12-19 DIAGNOSIS — Z9981 Dependence on supplemental oxygen: Secondary | ICD-10-CM | POA: Diagnosis not present

## 2016-12-19 DIAGNOSIS — F419 Anxiety disorder, unspecified: Secondary | ICD-10-CM

## 2016-12-19 DIAGNOSIS — J029 Acute pharyngitis, unspecified: Secondary | ICD-10-CM

## 2016-12-19 DIAGNOSIS — Z7982 Long term (current) use of aspirin: Secondary | ICD-10-CM | POA: Diagnosis not present

## 2016-12-19 DIAGNOSIS — I10 Essential (primary) hypertension: Secondary | ICD-10-CM | POA: Diagnosis not present

## 2016-12-19 DIAGNOSIS — I110 Hypertensive heart disease with heart failure: Secondary | ICD-10-CM

## 2016-12-19 DIAGNOSIS — R339 Retention of urine, unspecified: Secondary | ICD-10-CM

## 2016-12-19 DIAGNOSIS — I2510 Atherosclerotic heart disease of native coronary artery without angina pectoris: Secondary | ICD-10-CM | POA: Diagnosis present

## 2016-12-19 DIAGNOSIS — J42 Unspecified chronic bronchitis: Secondary | ICD-10-CM | POA: Diagnosis not present

## 2016-12-19 DIAGNOSIS — S0003XD Contusion of scalp, subsequent encounter: Secondary | ICD-10-CM

## 2016-12-19 DIAGNOSIS — Z951 Presence of aortocoronary bypass graft: Secondary | ICD-10-CM | POA: Diagnosis not present

## 2016-12-19 DIAGNOSIS — K5900 Constipation, unspecified: Secondary | ICD-10-CM | POA: Diagnosis not present

## 2016-12-19 DIAGNOSIS — W19XXXD Unspecified fall, subsequent encounter: Secondary | ICD-10-CM | POA: Diagnosis not present

## 2016-12-19 DIAGNOSIS — Z79899 Other long term (current) drug therapy: Secondary | ICD-10-CM | POA: Diagnosis not present

## 2016-12-19 DIAGNOSIS — R569 Unspecified convulsions: Secondary | ICD-10-CM

## 2016-12-19 DIAGNOSIS — T380X5A Adverse effect of glucocorticoids and synthetic analogues, initial encounter: Secondary | ICD-10-CM

## 2016-12-19 DIAGNOSIS — S069X2S Unspecified intracranial injury with loss of consciousness of 31 minutes to 59 minutes, sequela: Secondary | ICD-10-CM | POA: Diagnosis not present

## 2016-12-19 DIAGNOSIS — D72829 Elevated white blood cell count, unspecified: Secondary | ICD-10-CM

## 2016-12-19 DIAGNOSIS — R1310 Dysphagia, unspecified: Secondary | ICD-10-CM | POA: Diagnosis not present

## 2016-12-19 DIAGNOSIS — F1010 Alcohol abuse, uncomplicated: Secondary | ICD-10-CM | POA: Diagnosis not present

## 2016-12-19 DIAGNOSIS — S065X0S Traumatic subdural hemorrhage without loss of consciousness, sequela: Secondary | ICD-10-CM

## 2016-12-19 DIAGNOSIS — E876 Hypokalemia: Secondary | ICD-10-CM | POA: Diagnosis not present

## 2016-12-19 DIAGNOSIS — J449 Chronic obstructive pulmonary disease, unspecified: Secondary | ICD-10-CM | POA: Diagnosis not present

## 2016-12-19 DIAGNOSIS — R609 Edema, unspecified: Secondary | ICD-10-CM | POA: Diagnosis not present

## 2016-12-19 DIAGNOSIS — R8290 Unspecified abnormal findings in urine: Secondary | ICD-10-CM | POA: Diagnosis not present

## 2016-12-19 DIAGNOSIS — I5031 Acute diastolic (congestive) heart failure: Secondary | ICD-10-CM | POA: Diagnosis not present

## 2016-12-19 DIAGNOSIS — Z955 Presence of coronary angioplasty implant and graft: Secondary | ICD-10-CM | POA: Diagnosis not present

## 2016-12-19 DIAGNOSIS — G4733 Obstructive sleep apnea (adult) (pediatric): Secondary | ICD-10-CM

## 2016-12-19 DIAGNOSIS — Z96653 Presence of artificial knee joint, bilateral: Secondary | ICD-10-CM

## 2016-12-19 DIAGNOSIS — S065X2S Traumatic subdural hemorrhage with loss of consciousness of 31 minutes to 59 minutes, sequela: Secondary | ICD-10-CM | POA: Diagnosis not present

## 2016-12-19 DIAGNOSIS — R1312 Dysphagia, oropharyngeal phase: Secondary | ICD-10-CM

## 2016-12-19 DIAGNOSIS — R7401 Elevation of levels of liver transaminase levels: Secondary | ICD-10-CM

## 2016-12-19 DIAGNOSIS — R600 Localized edema: Secondary | ICD-10-CM

## 2016-12-19 DIAGNOSIS — E785 Hyperlipidemia, unspecified: Secondary | ICD-10-CM

## 2016-12-19 DIAGNOSIS — S069X2A Unspecified intracranial injury with loss of consciousness of 31 minutes to 59 minutes, initial encounter: Secondary | ICD-10-CM | POA: Diagnosis present

## 2016-12-19 DIAGNOSIS — B9620 Unspecified Escherichia coli [E. coli] as the cause of diseases classified elsewhere: Secondary | ICD-10-CM

## 2016-12-19 DIAGNOSIS — J410 Simple chronic bronchitis: Secondary | ICD-10-CM | POA: Diagnosis not present

## 2016-12-19 DIAGNOSIS — S065X0D Traumatic subdural hemorrhage without loss of consciousness, subsequent encounter: Principal | ICD-10-CM

## 2016-12-19 DIAGNOSIS — Z9989 Dependence on other enabling machines and devices: Secondary | ICD-10-CM

## 2016-12-19 DIAGNOSIS — N390 Urinary tract infection, site not specified: Secondary | ICD-10-CM | POA: Diagnosis not present

## 2016-12-19 DIAGNOSIS — I482 Chronic atrial fibrillation: Secondary | ICD-10-CM

## 2016-12-19 DIAGNOSIS — I959 Hypotension, unspecified: Secondary | ICD-10-CM | POA: Diagnosis not present

## 2016-12-19 DIAGNOSIS — Y92007 Garden or yard of unspecified non-institutional (private) residence as the place of occurrence of the external cause: Secondary | ICD-10-CM | POA: Diagnosis not present

## 2016-12-19 DIAGNOSIS — A499 Bacterial infection, unspecified: Secondary | ICD-10-CM | POA: Insufficient documentation

## 2016-12-19 DIAGNOSIS — I4819 Other persistent atrial fibrillation: Secondary | ICD-10-CM | POA: Diagnosis present

## 2016-12-19 MED ORDER — LEVETIRACETAM 500 MG PO TABS
500.0000 mg | ORAL_TABLET | Freq: Two times a day (BID) | ORAL | Status: DC
  Administered 2016-12-19 – 2016-12-24 (×9): 500 mg via ORAL
  Filled 2016-12-19 (×10): qty 1

## 2016-12-19 MED ORDER — B COMPLEX-C PO TABS
1.0000 | ORAL_TABLET | Freq: Every day | ORAL | Status: DC
  Administered 2016-12-20 – 2017-01-10 (×22): 1 via ORAL
  Filled 2016-12-19 (×22): qty 1

## 2016-12-19 MED ORDER — ASPIRIN EC 81 MG PO TBEC
81.0000 mg | DELAYED_RELEASE_TABLET | Freq: Every day | ORAL | Status: DC
  Administered 2016-12-20 – 2016-12-23 (×4): 81 mg via ORAL
  Filled 2016-12-19 (×5): qty 1

## 2016-12-19 MED ORDER — SENNA 8.6 MG PO TABS
1.0000 | ORAL_TABLET | Freq: Two times a day (BID) | ORAL | Status: DC
  Administered 2016-12-19 – 2017-01-10 (×43): 8.6 mg via ORAL
  Filled 2016-12-19 (×44): qty 1

## 2016-12-19 MED ORDER — DEXAMETHASONE 4 MG PO TABS
4.0000 mg | ORAL_TABLET | Freq: Three times a day (TID) | ORAL | Status: DC
  Administered 2016-12-19 – 2016-12-29 (×29): 4 mg via ORAL
  Filled 2016-12-19 (×30): qty 1

## 2016-12-19 MED ORDER — POTASSIUM CHLORIDE ER 10 MEQ PO TBCR
10.0000 meq | EXTENDED_RELEASE_TABLET | Freq: Every day | ORAL | Status: DC
  Filled 2016-12-19 (×2): qty 1

## 2016-12-19 MED ORDER — ALPRAZOLAM 0.25 MG PO TABS
0.2500 mg | ORAL_TABLET | Freq: Every day | ORAL | Status: DC
  Administered 2016-12-19 – 2017-01-09 (×22): 0.25 mg via ORAL
  Filled 2016-12-19 (×22): qty 1

## 2016-12-19 MED ORDER — POLYETHYLENE GLYCOL 3350 17 G PO PACK
17.0000 g | PACK | Freq: Every day | ORAL | Status: DC | PRN
  Administered 2016-12-23 – 2017-01-06 (×2): 17 g via ORAL
  Filled 2016-12-19: qty 1

## 2016-12-19 MED ORDER — ESCITALOPRAM OXALATE 10 MG PO TABS
10.0000 mg | ORAL_TABLET | Freq: Every day | ORAL | Status: DC
  Administered 2016-12-19 – 2017-01-09 (×22): 10 mg via ORAL
  Filled 2016-12-19 (×22): qty 1

## 2016-12-19 MED ORDER — HYDROCODONE-ACETAMINOPHEN 5-325 MG PO TABS
1.0000 | ORAL_TABLET | Freq: Two times a day (BID) | ORAL | Status: DC | PRN
  Administered 2016-12-31: 1 via ORAL
  Filled 2016-12-19: qty 1

## 2016-12-19 MED ORDER — ONDANSETRON HCL 4 MG PO TABS: 4.0000 mg | ORAL_TABLET | Freq: Four times a day (QID) | ORAL | Status: DC | PRN

## 2016-12-19 MED ORDER — BISACODYL 10 MG RE SUPP: 10.0000 mg | Freq: Every day | RECTAL | Status: DC | PRN

## 2016-12-19 MED ORDER — METOPROLOL SUCCINATE ER 25 MG PO TB24
12.5000 mg | ORAL_TABLET | Freq: Two times a day (BID) | ORAL | Status: DC
  Administered 2016-12-19 – 2017-01-03 (×24): 12.5 mg via ORAL
  Filled 2016-12-19 (×31): qty 1

## 2016-12-19 MED ORDER — SORBITOL 70 % SOLN
30.0000 mL | Freq: Every day | Status: DC | PRN
  Administered 2017-01-07: 30 mL via ORAL
  Filled 2016-12-19: qty 30

## 2016-12-19 MED ORDER — AMIODARONE HCL 200 MG PO TABS
200.0000 mg | ORAL_TABLET | Freq: Two times a day (BID) | ORAL | Status: DC
  Administered 2016-12-19 – 2017-01-10 (×44): 200 mg via ORAL
  Filled 2016-12-19 (×44): qty 1

## 2016-12-19 MED ORDER — MAGNESIUM OXIDE 400 (241.3 MG) MG PO TABS
200.0000 mg | ORAL_TABLET | Freq: Every day | ORAL | Status: DC
  Administered 2016-12-20: 200 mg via ORAL
  Administered 2016-12-21: 100 mg via ORAL
  Administered 2016-12-22 – 2016-12-27 (×6): 200 mg via ORAL
  Administered 2016-12-28: 100 mg via ORAL
  Administered 2016-12-29 – 2017-01-10 (×13): 200 mg via ORAL
  Filled 2016-12-19 (×22): qty 1

## 2016-12-19 MED ORDER — ONDANSETRON HCL 4 MG/2ML IJ SOLN: 4.0000 mg | Freq: Four times a day (QID) | INTRAMUSCULAR | Status: DC | PRN

## 2016-12-19 MED ORDER — FUROSEMIDE 20 MG PO TABS
20.0000 mg | ORAL_TABLET | Freq: Every day | ORAL | Status: DC
  Administered 2016-12-20 – 2016-12-26 (×7): 20 mg via ORAL
  Filled 2016-12-19 (×7): qty 1

## 2016-12-19 MED ORDER — ATORVASTATIN CALCIUM 80 MG PO TABS
80.0000 mg | ORAL_TABLET | Freq: Every day | ORAL | Status: DC
  Administered 2016-12-20 – 2016-12-22 (×3): 80 mg via ORAL
  Filled 2016-12-19 (×3): qty 1

## 2016-12-19 NOTE — Care Management Note (Signed)
Case Management Note  Patient Details  Name: Jetty DuhamelJohn R Brumley MRN: [id number] Date of Birth: Sep 23, 1931  Subjective/Objective:                    Action/Plan: Pt discharging to CIR today. No further needs per CM.   Expected Discharge Date:  12/18/16               Expected Discharge Plan:  IP Rehab Facility  In-House Referral:     Discharge planning Services     Post Acute Care Choice:    Choice offered to:     DME Arranged:    DME Agency:     HH Arranged:    HH Agency:     Status of Service:  Completed, signed off  If discussed at [employer] Meetings, dates discussed:    Additional Comments:  Kermit BaloKelli F Adel Neyer, RN 12/19/2016, 4:10 PM

## 2016-12-19 NOTE — Progress Notes (Signed)
  Speech Language Pathology Treatment: Dysphagia;Cognitive-Linquistic  Patient Details Name: Alex DuhamelJohn R [name] MRN: [id number] DOB: [birth year] Today's Date: 12/19/2016 Time: 1540-1600 SLP Time Calculation (min) (ACUTE ONLY): 20 min  Assessment / Plan / Recommendation Clinical Impression  Pt needs Mod cues for sustained attention and emergent awareness throughout self-feeding tasks. He consumed advanced trials of solid textures with mild-moderate oral residue, which clears easily with use of liquid wash. An immediate cough was observed x1 across all intake. Recommend advancement to Dys 2 textures. Swallowing precautions reviewed with wife present.   HPI HPI: 83 year old male recently d/c to CIR after right-sided subdural hematoma with seizures. He had increased lethargy and repeat CT Head 2/19 showed increased SDH, now s/p craniotomy. MBS 2/15 recommended full liquid diet due to primarily oral dysphagia. PMHx: Aortic stenosis, COPD, Hx of CAD, Obesity, Obstructive sleep apnea, Afib.       SLP Plan  Continue with current plan of care       Recommendations  Diet recommendations: Dysphagia 2 (fine chop);Thin liquid Liquids provided via: Cup;Straw Medication Administration: Crushed with puree Supervision: Full supervision/cueing for compensatory strategies;Staff to assist with self feeding;Patient able to self feed Compensations: Minimize environmental distractions;Slow rate;Small sips/bites;Follow solids with liquid Postural Changes and/or Swallow Maneuvers: Seated upright 90 degrees                Oral Care Recommendations: Oral care BID Follow up Recommendations: Inpatient Rehab SLP Visit Diagnosis: Dysphagia, oral phase (R13.11) Plan: Continue with current plan of care       GO                Maxcine Hamaiewonsky, Avya Flavell 12/19/2016, 4:19 PM  Maxcine HamLaura Paiewonsky, M.A. CCC-SLP [phone]

## 2016-12-19 NOTE — Clinical Social Work Note (Signed)
Clinical Social Work Assessment  Patient Details  Name: Alex [name] MRN: [id number] Date of Birth: 1931/02/27  Date of referral:  12/19/16               Reason for consult:  Discharge Planning                Permission sought to share information with:  Family Supports, Customer service manager Permission granted to share information::  Yes, Verbal Permission Granted  Name::     [place] Newhart  Agency::     Relationship::  Spouse  Contact Information:  [phone]  Housing/Transportation Living arrangements for the past 2 months:  [place] of Information:  Patient, Spouse Patient Interpreter Needed:  None Criminal Activity/Legal Involvement Pertinent to Current Situation/Hospitalization:  No - Comment as needed Significant Relationships:  Spouse Lives with:  Spouse Do you feel safe going back to the place where you live?  Yes Need for family participation in patient care:  Yes (Comment)  Care giving concerns:  No caregiving concerns identified.    Social Worker assessment / plan:  CSW consulted for SNF backup to CIR. CSW met with pt and wife at bedside. CSW introduced self and explained CSW responsibilities. Pt and wife agreeable to SNF backup as wife stated she cannot care for pt at home. CSW faxed out FL-2 to SNFs.   CIR accepted and received insurance auth for pt. CSW signing off as no further needs identified.   Employment status:  Retired Forensic scientist:   [employer]ail [employer]) PT Recommendations:  [place] / Referral to community resources:  [place]  Patient/Family's Response to care:  Pt and wife appreciative of CSW support.   Patient/Family's Understanding of and Emotional Response to Diagnosis, Current Treatment, and Prognosis:  Pt and wife understand the benefit of STR before returning home.   Emotional Assessment Appearance:  Appears stated age Attitude/Demeanor/Rapport:    (Appropriate) Affect (typically observed):  Accepting, Adaptable, Pleasant Orientation:  Oriented to Self, Oriented to Place, Oriented to  Time, Oriented to Situation Alcohol / Substance use:  Other Psych involvement (Current and /or in the community):  No (Comment)  Discharge Needs  Concerns to be addressed:  Care Coordination Readmission within the last 30 days:  Yes Current discharge risk:  Dependent with Mobility Barriers to Discharge:  Continued Medical Work up   [employer], LCSW 12/19/2016, 5:20 PM

## 2016-12-19 NOTE — H&P (Signed)
Physical Medicine and Rehabilitation Admission H&P    Chief complaint: Weakness  HPI: Alex [name] a 83 y.o.right handed malewith history of morbid obesity, alcohol use, COPD with nighttime oxygen, obstructive sleep apnea with CPAP, atrial fibrillation/AVR/CABG/PPM maintained on aspirin and Coumadin. Per chart review and wife, patient lives with spouse independent prior to admission. One level home with 3 steps to entry. Presented 12/03/2016 after a recent fall while in the yard and struck his head without loss of consciousness. Develop dry heaving gradual onset of moderate squeezing chest pain shortness of breath. CT of the head showed left parietal scalp hematoma. Acute right hemispheric subdural hematoma with maximal thickness in the right posterior parietal region measuring 15 mm. Right to left shift of 7.5 mm. Troponin negative. INR of 4.14 that was reversed. Neurosurgery Dr. Sherley Bounds advised conservative care. Hospital course confusion with delirium he had been started on Precedex. Soft restraints were added for patient safety. Reported grand mal seizure 12/04/2016 required intubation and currently maintained on Keppra. EEG showed occasional low amplitude epileptiform discharges over the right posterior temporal region 3 clinicoelectrographic seizures captured arising from the right hemisphere lasting 2-4 minutes. Patient remains off anticoagulation due to subdural hematoma. Patient initially NPOwith nasogastric tube for nutritional support however patient did pull his nasogastric tube out. A swallow study 12/11/2016 placed on a full liquid diet.. Latest cranial CT scan 12/10/2016 reviewed, showingstable right subdural hematoma with stable mass effect. Per report, no new large territory infarct or parenchymal hemorrhage identified. Physical andoccupational therapy evaluations completed with recommendations of physical medicine rehabilitation consult.Patient was admitted for a  comprehensive rehabilitation program 12/10/2016. Patient was slow progressive gains. Cardiology consulted 12/12/2016 with increasing shortness of breath, tachycardia and wheezing. Chest x-ray consistent with pulmonary edema with small-to-moderate posterior pleural effusion. He did receive intravenous Lasix. His amiodarone was adjusted and Lopressor was titrated due to tachycardia. Patient continued on his full liquid diet. 12/15/2016 patient more lethargic and difficult to arouse. Follow-up CT of the head showed interval increase in size of large broad-based right sided subdural hematoma now measuring 21.5 mm maximal thickness versus 9 mm. Increased mass effect upon the right lateral ventricle with midline shift to the left by 14.4 mm versus prior 7.4. Neurosurgery was consulted and patient was discharged to acute care services 12/15/2016 for ongoing monitoring in guarded condition. Underwent right frontotemporal parietal craniotomy for evacuation of subdural hematoma 12/16/2016 per Dr. Cyndy Freeze. Maintained on Keppra for seizure prophylaxis as well as Decadron protocol. His low-dose aspirin was resumed for history of atrial fibrillation but no Coumadin at this time due to SDH. Mild hypokalemia with supplement added. Physical and occupational therapy evaluations resumed. Patient has been readmitted back to inpatient rehabilitation services for ongoing comprehensive therapies  Review of Systems  Constitutional: Negative for fever.  HENT: Negative for ear pain.   Eyes: Negative for blurred vision.  Respiratory: Negative for cough and shortness of breath.   Cardiovascular: Negative for chest pain.  Gastrointestinal: Negative for nausea and vomiting.  Genitourinary: Negative for flank pain.  Musculoskeletal: Negative for myalgias.  Skin: Negative for rash.  Neurological: Positive for focal weakness and headaches.  Psychiatric/Behavioral: Negative for depression.       Past Medical History:  Diagnosis Date   . Aortic stenosis    s/p AVR by Dr Cyndia Bent  . COPD (chronic obstructive pulmonary disease) ([place])   . History of coronary artery disease    status post stenting of the marginal circumflex in 12/2003 and again  in 2009  . Hyperlipidemia   . Morbid obesity (HCC)    weight 243 pounds, BMI 31.2kg/m2, BSA 2.36 square meters  . Obstructive sleep apnea    compliant with CPAP  . Persistent atrial fibrillation [facility])         Past Surgical History:  Procedure Laterality Date  . AORTIC VALVE REPLACEMENT (AVR)/CORONARY ARTERY BYPASS GRAFTING (CABG)   08/05/2011   LIMA to LAD, sequential saphenous vein graft to third and fourth obtuse marginal branches of the circumflex, aortic valve replacement using a 23 mm Edwards pericardial valve  . APPENDECTOMY    . CARDIOVERSION N/A 11/14/2014   Procedure: CARDIOVERSION;  Surgeon: Candee Furbish, MD;  Location: [facility] ENDOSCOPY;  Service: Cardiovascular;  Laterality: N/A;  . CARDIOVERSION N/A 11/20/2016   Procedure: CARDIOVERSION;  Surgeon: Fay Records, MD;  Location: [place];  Service: Cardiovascular;  Laterality: N/A;  . CAROTID ENDARTERECTOMY     Dr Levi Aland  . CRANIOTOMY Right 12/16/2016   Procedure: CRANIOTOMY HEMATOMA EVACUATION SUBDURAL;  Surgeon: Kevan Ny Ditty, MD;  Location: [place];  Service: Neurosurgery;  Laterality: Right;  . EP IMPLANTABLE DEVICE N/A 11/06/2015   Procedure: PPM Generator Changeout;for sick sinus syndrome with a MDT Adapta L PPM, chronically elevated RV threshold.  . permanent pacemaker     MDT EnRhythm implanted by Dr Sherilyn Banker in [place] for complete heart block with syncope  . REPLACEMENT TOTAL KNEE BILATERAL     2006  . TEE WITHOUT CARDIOVERSION N/A 11/14/2014   Procedure: TRANSESOPHAGEAL ECHOCARDIOGRAM (TEE);  Surgeon: Candee Furbish, MD;  Location[facility] ENDOSCOPY;  Service: Cardiovascular;  Laterality: N/A;         Family History  Problem Relation Age of Onset  . Alzheimer's disease  Mother   . Tuberculosis Father   . Other Father     Spinal Meningitis   Social History:  reports that he quit smoking about 13 years ago. His smoking use included Cigarettes. He started smoking about 43 years ago. He has a 15.00 pack-year smoking history. He has never used smokeless tobacco. He reports that he drinks alcohol. He reports that he does not use drugs. Allergies: No Known Allergies       Medications Prior to Admission  Medication Sig Dispense Refill  . ALPRAZolam (XANAX) 0.25 MG tablet Take 0.25 mg by mouth at bedtime.    Marland Kitchen amiodarone (PACERONE) 200 MG tablet Take 1 tablet (200 mg total) by mouth 2 (two) times daily. 60 tablet 1  . aspirin 81 MG tablet Take 81 mg by mouth daily.    Marland Kitchen atorvastatin (LIPITOR) 80 MG tablet TAKE 1 TABLET BY MOUTH DAILY 90 tablet 3  . B Complex Vitamins (VITAMIN B COMPLEX PO) Take 1 tablet by mouth daily.     Marland Kitchen escitalopram (LEXAPRO) 10 MG tablet Take 10 mg by mouth at bedtime.    . furosemide (LASIX) 20 MG tablet Take 1 tablet (48m) by mouth daily. May take extra tablet for weight gain >3lbs 45 tablet 6  . HYDROcodone-acetaminophen (NORCO/VICODIN) 5-325 MG tablet Take 1 tablet by mouth 2 (two) times daily as needed for moderate pain.     .Marland Kitchenibuprofen (ADVIL,MOTRIN) 200 MG tablet Take 400 mg by mouth daily as needed for moderate pain.    . Magnesium 250 MG TABS Take 1 tablet (250 mg total) by mouth daily.  0  . metoprolol succinate (TOPROL-XL) 25 MG 24 hr tablet Take 12.5 mg by mouth 2 (two) times daily.     .Marland Kitchen  potassium chloride (K-DUR) 10 MEQ tablet Take 1 tablet (90mq) by mouth daily. Take extra 1 tablet if taking extra dose of lasix 45 tablet 3  . zolpidem (AMBIEN) 10 MG tablet Take 5-10 mg by mouth at bedtime.       Home: Home Living Family/patient expects to be discharged to:: Inpatient rehab Living Arrangements: Spouse/significant other Available Help at Discharge: Family, Available 24 hours/day Type of Home:  House  Lives With: Spouse   Functional History: Prior Function Level of Independence: Needs assistance Gait / Transfers Assistance Needed: pt working on transfers [place] in [place]/ Homem[place] Assistance Needed: assist with ADL  Functional Status:  Mobility: Bed Mobility Overal bed mobility: Needs Assistance Bed Mobility: Supine to Sit Supine to sit: Min assist Sit to supine: +2 for physical assistance, Total assist General bed mobility comments: Pt required increased time with + use of railing and elevated HOB.  Min assist for LE advancement and elevation of trunk to edge of bed.   Transfers Overall transfer level: Needs assistance Equipment used: Rolling walker (2 wheeled) Transfers: Sit to/from Stand, SW.W. Grainger IncTransfers Sit to Stand: Mod assist, +2 physical assistance Stand pivot transfers: Max assist, +2 physical assistance (Pt begins to sit impulsively after two steps towards the chair.  ) General transfer comment: x2 standing trials at RW, significant weakness and coordination deficits on LUE.  Pt required cues to push from bed and extend hips, knees, trunk and head.  Pt returned to sitting on 1st trial to use the urinal.  On second attempt patient able to take steps to chair but began to sit impulsively.   Ambulation/Gait Ambulation/Gait assistance: Max assist, +2 physical assistance Ambulation Distance (Feet):  (steps from bed to chair.  ) Assistive device: Rolling walker (2 wheeled) Gait Pattern/deviations: Shuffle, Staggering right General Gait Details: Pt performed increased mobility advancing steps from bed to chair.  Pt remains to present with poor cognition.  Upon advancing steps from bed to chair patient began to sit slowly requiring cues to remain upright.   Gait velocity interpretation: Below normal speed for age/gender  ADL: ADL Overall ADL's : Needs assistance/impaired General ADL Comments: Pt with difficulty following commands during session and  would require max assist for all ADL. Attempted sit to stand from EOB x3 with total assist +2 and pt unable to acheive full standing.  Cognition: Cognition Overall Cognitive Status: Impaired/Different from baseline Arousal/Alertness: Lethargic Orientation Level: Disoriented to place, Disoriented to time, Disoriented to situation Attention: Sustained Sustained Attention: Impaired Sustained Attention Impairment: Verbal basic, Functional basic Memory: Impaired Memory Impairment: Decreased recall of new information Awareness: Impaired Awareness Impairment: Intellectual impairment Problem Solving: Impaired Problem Solving Impairment: Verbal basic, Functional basic Safety/Judgment: Impaired Cognition Arousal/Alertness: Awake/alert Behavior During Therapy: WFL for tasks assessed/performed Overall Cognitive Status: Impaired/Different from baseline Area of Impairment: Problem solving, Safety/judgement, Following commands Current Attention Level: Selective Following Commands: Follows one step commands with increased time, Follows one step commands inconsistently Safety/Judgement: Decreased awareness of safety, Decreased awareness of deficits Problem Solving: Slow processing, Decreased initiation, Requires verbal cues, Requires tactile cues General Comments: Pt impulsively starting to sit when transferring from bed to chair.    Physical Exam: Blood pressure 121/62, pulse 62, temperature 97.4 F (36.3 C), temperature source Oral, resp. rate 16, height _0  (1.88 m), weight 103.2 kg (227 lb 8.2 oz), SpO2 93 %. Physical Exam  Constitutional: No distress.  HENT:  Clips in place from cranio.Clean and dry  Eyes: EOM are normal. Left  eye exhibits no discharge.  Neck: Normal range of motion. Neck supple. No tracheal deviation present. No thyromegaly present.  Cardiovascular:  Murmur heard. IRR and IRR  Respiratory: Effort normal and breath sounds normal. No respiratory distress. He has no  wheezes. He has no rales.  GI: Soft. Bowel sounds are normal. He exhibits no distension. There is no tenderness. There is no rebound.  Skin: He is not diaphoretic.  Neurological.Alert and makes good eye contact. Knows he's at [facility]. Knew he injured his head. Recalled the month was January. Otherwise tangential and confabulating at times. REcognized wife and remembered me from initial stay. No focal field cuts. Speech clear. UE grossly 4/5 prox to distal. LE: 3/5 HF, KE and 4/5 ADF/PF. Senses pain in all 4's. DTR's 2+.  Psych: pleasant and cooperative but distracted.   Lab Results Last 48 Hours        Results for orders placed or performed during the hospital encounter of 12/15/16 (from the past 48 hour(s))  Basic metabolic panel     Status: Abnormal   Collection Time: 12/16/16  5:00 PM  Result Value Ref Range   Sodium 145 135 - 145 mmol/L   Potassium 3.1 (L) 3.5 - 5.1 mmol/L   Chloride 108 101 - 111 mmol/L   CO2 29 22 - 32 mmol/L   Glucose, Bld 115 (H) 65 - 99 mg/dL   BUN 15 6 - 20 mg/dL   Creatinine, Ser 0.65 0.61 - 1.24 mg/dL   Calcium 8.0 (L) 8.9 - 10.3 mg/dL   GFR calc non Af Amer >60 >60 mL/min   GFR calc Af Amer >60 >60 mL/min    Comment: (NOTE) The eGFR has been calculated using the CKD EPI equation. This calculation has not been validated in all clinical situations. eGFR's persistently <60 mL/min signify possible Chronic Kidney Disease.    Anion gap 8 5 - 15  CBC     Status: Abnormal   Collection Time: 12/16/16  5:00 PM  Result Value Ref Range   WBC 11.5 (H) 4.0 - 10.5 K/uL   RBC 3.53 (L) 4.22 - 5.81 MIL/uL   Hemoglobin 11.6 (L) 13.0 - 17.0 g/dL   HCT 36.4 (L) 39.0 - 52.0 %   MCV 103.1 (H) 78.0 - 100.0 fL   MCH 32.9 26.0 - 34.0 pg   MCHC 31.9 30.0 - 36.0 g/dL   RDW 13.4 11.5 - 15.5 %   Platelets 190 150 - 400 K/uL      Imaging Results (Last 48 hours)  Ct Head Wo Contrast  Result Date: 12/17/2016 CLINICAL DATA:  Continued  surveillance subdural hematoma. EXAM: CT HEAD WITHOUT CONTRAST TECHNIQUE: Contiguous axial images were obtained from the base of the skull through the vertex without intravenous contrast. COMPARISON:  12/15/2016. FINDINGS: Brain: The patient has undergone drainage of a subacute RIGHT subdural hematoma. Large RIGHT hemisphere craniotomy flap. No subdural drain was placed. There is extensive pneumocephalus. Residual subdural fluid, mixed attenuation, up to 14 mm thick. Residual 9 mm of RIGHT-to-LEFT shift. Some associated subdural hemorrhage. Generalized atrophy. Chronic microvascular ischemic change. No parenchymal hematoma. Vascular: Advanced vascular calcification in the carotid siphons. No hyperdense proximal vessel. Skull: Craniotomy flap appears well seated.  No fracture. Sinuses/Orbits: No layering fluid.  BILATERAL cataract extraction. Other: None. IMPRESSION: Significant residual RIGHT subdural hematoma, up to 14 mm thick. Extensive pneumocephalus. 9 mm of RIGHT-to-LEFT shift is improved but warrants continued surveillance. Electronically Signed   By: Staci Righter M.D.   On:  12/17/2016 06:51        Medical Problem List and Plan: 1.  Decreased functional mobility with altered mental status secondary to traumatic subdural hematoma status post right frontotemporal parietal craniotomy 12/16/2016             -readmitting to inpatient rehab today. Pt's arousal much improved 2.  DVT Prophylaxis/Anticoagulation: SCDs. Monitor for any signs of DVT 3. Pain Management: Hydrocodone as needed 4. Mood: Xanax 0.25 mg daily at bedtime, Lexapro 10 mg daily 5. Neuropsych: This patient is capable of making decisions on his own behalf. 6. Skin/Wound Care: Routine skin checks 7. Fluids/Electrolytes/Nutrition: Routine I&O with follow-up chemistries 8. Seizure prophylaxis. Keppra 500 mg every 12 hours 9. Atrial fibrillation/AVR/CABG/PPM. Continue low-dose aspirin. Amiodarone 200 mg twice a day, Toprol 12.5 mg  twice a day. Follow-up per cardiology services 10. Dysphagia. Dysphagia #1 thin liquid diet. Follow-up speech therapy 11. Acute diastolic congestive heart failure. Lasix 20 mg daily. Monitor for any fluid overload 12. Hypokalemia. Follow-up chemistries 13. History of COPD. Check oxygen saturations every shift 14. History of alcohol abuse. Monitor for withdrawal. Provide counseling as appropriate 15. Constipation. Laxative assistance  Post Admission Physician Evaluation: 1. Functional deficits secondary  to SDH sp craniotomy. 2. Patient is admitted to receive collaborative, interdisciplinary care between the physiatrist, rehab nursing staff, and therapy team. 3. Patient's level of medical complexity and substantial therapy needs in context of that medical necessity cannot be provided at a lesser intensity of care such as a SNF. 4. Patient has experienced substantial functional loss from his/her baseline which was documented above under the "Functional History" and "Functional Status" headings.  Judging by the patient's diagnosis, physical exam, and functional history, the patient has potential for functional progress which will result in measurable gains while on inpatient rehab.  These gains will be of substantial and practical use upon discharge  in facilitating mobility and self-care at the household level. 5. Physiatrist will provide 24 hour management of medical needs as well as oversight of the therapy plan/treatment and provide guidance as appropriate regarding the interaction of the two. 6. The Preadmission Screening has been reviewed and patient status is unchanged unless otherwise stated above. 7. 24 hour rehab nursing will assist with bladder management, bowel management, safety, skin/wound care, disease management, medication administration, pain management and patient education  and help integrate therapy concepts, techniques,education, etc. 8. PT will assess and treat for/with: Lower  extremity strength, range of motion, stamina, balance, functional mobility, safety, adaptive techniques and equipment, cognitive perceptual awareness, family education.   Goals are: supervision. 9. OT will assess and treat for/with: ADL's, functional mobility, safety, upper extremity strength, adaptive techniques and equipment, NMR, cognitve perceptual awareness, family education.   Goals are: supervision to min assist. Therapy may not yet proceed with showering this patient. 10. SLP will assess and treat for/with: cognition, communication, swallowing.  Goals are: mod I with swallowing and supervision to min assist with cognition. 11. Case Management and Social Worker will assess and treat for psychological issues and discharge planning. 12. Team conference will be held weekly to assess progress toward goals and to determine barriers to discharge. 13. Patient will receive at least 3 hours of therapy per day at least 5 days per week. 14. ELOS: 18-24 days       15. Prognosis:  excellent     Meredith Staggers, MD, [place] [facility] 12/19/2016  Cathlyn Parsons., PA-C 12/18/2016

## 2016-12-19 NOTE — Progress Notes (Signed)
Pt getting transferred to inpatient rehab to 4W13 per orders from MD. Pt and spouse educated on transfer. Spouse verbalized understanding of transfer. All questions and concerns were addressed. RN gave report to [place].

## 2016-12-19 NOTE — [place] FL2 (Signed)
[place] MEDICAID FL2 LEVEL OF CARE SCREENING TOOL     IDENTIFICATION  Patient Name: Alex DuhamelJohn R Almeda Birthdate: 07/13/1931 Sex: male Admission Date (Current Location): 12/15/2016  [facility] [place]   Facility and Address:  [facility] [place], [address], [place], [place] [address]      Provider Number: [id number]  Attending Physician Name and Address:  Loura HaltBenjamin Jared Ditty, MD  Relative Name and Phone Number:       Current Level of Care: Hospital Recommended Level of Care: Skilled Nursing Facility Prior Approval Number:    Date Approved/Denied:   PASRR Number:    Discharge Plan: SNF    Current Diagnoses: Patient Active Problem List   Diagnosis Date Noted  . Acute pulmonary edema (HCC) 12/12/2016  . Acute diastolic CHF (congestive heart failure) (HCC) 12/12/2016  . Atrial flutter (HCC) 12/12/2016  . Traumatic subdural bleed with LOC of 1 hour to 5 hours 59 minutes (HCC) 12/11/2016  . Class 1 obesity due to excess calories with body mass index (BMI) of 30.0 to 30.9 in adult   . ETOH abuse   . Chronic obstructive pulmonary disease (HCC)   . Supplemental oxygen dependent   . Alcohol use   . OSA on CPAP   . Atrial fibrillation (HCC)   . Coronary artery disease involving coronary bypass graft of native heart without angina pectoris   . Seizures (HCC)   . Dysphagia   . Bradycardia   . Hyperglycemia   . Agitation   . Hypokalemia   . Hypernatremia   . Leukocytosis   . Macrocytic anemia   . Thrombocytopenia (HCC)   . Traumatic subdural hematoma without loss of consciousness (HCC)   . Change in mental status   . Subdural hematoma (HCC) 12/03/2016  . Sick sinus syndrome (HCC) 11/06/2015  . PAF (paroxysmal atrial fibrillation) (HCC) 11/14/2014  . SOB (shortness of breath) 10/31/2014  . Persistent atrial fibrillation (HCC) 10/31/2014  . Aortic stenosis, severe 07/28/2011  . 3-vessel coronary artery disease 07/28/2011  .  Hyperlipidemia, mixed 07/28/2011    Orientation RESPIRATION BLADDER Height & Weight     Time, Situation, Self  Normal Incontinent Weight: 227 lb 8.2 oz (103.2 kg) Height:  6\' 2"  (188 cm)  BEHAVIORAL SYMPTOMS/MOOD NEUROLOGICAL BOWEL NUTRITION STATUS      Continent Diet (Dysphasia 1 (Puree); thin fluids)  AMBULATORY STATUS COMMUNICATION OF NEEDS Skin   Extensive Assist Verbally Other (Comment) (Head wound)                       Personal Care Assistance Level of Assistance  Bathing, Feeding, Dressing Bathing Assistance: Maximum assistance Feeding assistance: Limited assistance Dressing Assistance: Maximum assistance     Functional Limitations Info  Sight, Hearing, Speech Sight Info: Adequate Hearing Info: Adequate Speech Info: Adequate    SPECIAL CARE FACTORS FREQUENCY  PT (By licensed PT), OT (By licensed OT), Speech therapy     PT Frequency: 5 OT Frequency: 5     Speech Therapy Frequency: 5      Contractures Contractures Info: Not present    Additional Factors Info  Code Status, Allergies Code Status Info: DNR Allergies Info: No known allergies           Current Medications (12/19/2016):  This is the current hospital active medication list Current Facility-Administered Medications  Medication Dose Route Frequency Provider Last Rate Last Dose  . ALPRAZolam (XANAX) tablet 0.25 mg  0.25 mg Oral  QHS Darci Current Costella, PA-C   0.25 mg at 12/18/16 2207  . amiodarone (PACERONE) tablet 200 mg  200 mg Oral BID Darci Current Costella, PA-C   200 mg at 12/19/16 1040  . aspirin EC tablet 81 mg  81 mg Oral Daily Vincent J Costella, PA-C   81 mg at 12/19/16 1041  . atorvastatin (LIPITOR) tablet 80 mg  80 mg Oral Daily Vincent J Costella, PA-C   80 mg at 12/19/16 1041  . B-complex with vitamin C tablet 1 tablet  1 tablet Oral Daily Alyson Ingles, PA-C   1 tablet at 12/19/16 1041  . bisacodyl (DULCOLAX) suppository 10 mg  10 mg Rectal Daily PRN Vincent J Costella, PA-C       . dexamethasone (DECADRON) injection 4 mg  4 mg Intravenous Q8H Loura Halt Ditty, MD   4 mg at 12/19/16 1048  . escitalopram (LEXAPRO) tablet 10 mg  10 mg Oral QHS Darci Current Costella, PA-C   10 mg at 12/18/16 2207  . furosemide (LASIX) tablet 20 mg  20 mg Oral Daily Vincent J Costella, PA-C   20 mg at 12/19/16 1040  . HYDROcodone-acetaminophen (NORCO/VICODIN) 5-325 MG per tablet 1 tablet  1 tablet Oral BID PRN Alyson Ingles, PA-C   1 tablet at 12/18/16 1549  . ibuprofen (ADVIL,MOTRIN) tablet 400 mg  400 mg Oral Daily PRN Vincent J Costella, PA-C      . labetalol (NORMODYNE,TRANDATE) injection 10-40 mg  10-40 mg Intravenous Q10 min PRN Vincent J Costella, PA-C      . levETIRAcetam (KEPPRA) 500 mg in sodium chloride 0.9 % 100 mL IVPB  500 mg Intravenous Q12H Vincent J Costella, PA-C   500 mg at 12/19/16 0332  . magnesium oxide (MAG-OX) tablet 200 mg  200 mg Oral Daily Loura Halt Ditty, MD   200 mg at 12/19/16 1040  . metoprolol succinate (TOPROL-XL) 24 hr tablet 12.5 mg  12.5 mg Oral BID Darci Current Costella, PA-C   12.5 mg at 12/19/16 1041  . polyethylene glycol (MIRALAX / GLYCOLAX) packet 17 g  17 g Oral Daily PRN Vincent J Costella, PA-C      . potassium chloride (K-DUR) CR tablet 10 mEq  10 mEq Oral Daily Vincent J Costella, PA-C   10 mEq at 12/19/16 1040  . promethazine (PHENERGAN) tablet 12.5-25 mg  12.5-25 mg Oral Q4H PRN Vincent J Costella, PA-C      . senna (SENOKOT) tablet 8.6 mg  1 tablet Oral BID Darci Current Costella, PA-C   8.6 mg at 12/19/16 1040  . sodium phosphate (FLEET) 7-19 GM/118ML enema 1 enema  1 enema Rectal Once PRN Vincent J Costella, PA-C      . zolpidem (AMBIEN) tablet 5 mg  5 mg Oral QHS Darci Current Costella, PA-C   5 mg at 12/18/16 2205     Discharge Medications: Please see discharge summary for a list of discharge medications.  Relevant Imaging Results:  Relevant Lab Results:   Additional Information SSN: [id number]  Dominic Pea, LCSW

## 2016-12-19 NOTE — Care Management Important Message (Signed)
Important Message  Patient Details  Name: Alex DuhamelJohn R Burdi MRN: [id number] Date of Birth: 01-07-1931   Medicare Important Message Given:  Yes    Kyla BalzarineShealy, Teneka Malmberg Abena 12/19/2016, 2:08 PM

## 2016-12-19 NOTE — Progress Notes (Signed)
Ankit Karis Juba, MD Physician Addendum Physical Medicine and Rehabilitation  Consult Note Date of Service: 12/09/2016 5:53 AM  Related encounter: ED to Hosp-Admission (Discharged) from 12/03/2016 in [facility] 48[facility] NEURO MEDICAL     Expand All Collapse All   [] Hide copied text [] Hover for attribution information      Physical Medicine and Rehabilitation Consult Reason for Consult: Traumatic SDH Referring Physician: Dr. Marikay Alar   HPI: Alex [name] is a 83 y.o. right handed male with history of morbid obesity, alcohol use, COPD with nighttime oxygen, obstructive sleep apnea with CPAP, atrial fibrillation/AVR/CABG/PPM maintained on aspirin and Coumadin. Per chart review and wife, patient lives with spouse independent prior to admission. One level home with 3 steps to entry. Presented 12/03/2016 after a recent fall while in the yard and struck his head without loss of consciousness. Develop dry heaving gradual onset of moderate squeezing chest pain shortness of breath. CT of the head showed left parietal scalp hematoma. Acute right hemispheric subdural hematoma with maximal thickness in the right posterior parietal region measuring 15 mm. Right to left shift of 7.5 mm. Troponin negative. INR of 4.14 that was reversed. Neurosurgery Dr. Marikay Alar advised conservative care. Hospital course confusion with delirium he had been started on Precedex. Soft restraints were added for patient safety. Reported grand mal seizure 12/04/2016 required intubation and currently maintained on Keppra. EEG showed occasional low amplitude epileptiform discharges over the right posterior temporal region 3 clinicoelectrographic seizures captured arising from the right hemisphere lasting 2-4 minutes. Patient remains off anticoagulation due to subdural hematoma. He is currently NPO with nasogastric tube feeds for nutritional support. Latest cranial CT scan 12/05/2016 reviewed, showing stable right  subdural hematoma with stable mass effect. Per report, no new large territory infarct or parenchymal hemorrhage identified. Physical occupational therapy evaluations completed with recommendations of physical medicine rehabilitation consult.   Review of Systems  Unable to perform ROS: Acuity of condition       Past Medical History:  Diagnosis Date  . Aortic stenosis    s/p AVR by Dr Laneta Simmers  . COPD (chronic obstructive pulmonary disease) (HCC)   . History of coronary artery disease    status post stenting of the marginal circumflex in 12/2003 and again in 2009  . Hyperlipidemia   . Morbid obesity (HCC)    weight 243 pounds, BMI 31.2kg/m2, BSA 2.36 square meters  . Obstructive sleep apnea    compliant with CPAP  . Persistent atrial fibrillation [facility])         Past Surgical History:  Procedure Laterality Date  . AORTIC VALVE REPLACEMENT (AVR)/CORONARY ARTERY BYPASS GRAFTING (CABG)   08/05/2011   LIMA to LAD, sequential saphenous vein graft to third and fourth obtuse marginal branches of the circumflex, aortic valve replacement using a 23 mm Edwards pericardial valve  . APPENDECTOMY    . CARDIOVERSION N/A 11/14/2014   Procedure: CARDIOVERSION;  Surgeon: Donato Schultz, MD;  Location[facility] ENDOSCOPY;  Service: Cardiovascular;  Laterality: N/A;  . CARDIOVERSION N/A 11/20/2016   Procedure: CARDIOVERSION;  Surgeon: Pricilla Riffle, MD;  Location: [facility] ENDOSCOPY;  Service: Cardiovascular;  Laterality: N/A;  . CAROTID ENDARTERECTOMY     Dr Lollie Sails  . EP IMPLANTABLE DEVICE N/A 11/06/2015   Procedure: PPM Generator Changeout;for sick sinus syndrome with a MDT Adapta L PPM, chronically elevated RV threshold.  . permanent pacemaker     MDT EnRhythm implanted by Dr Lawanda Cousins in [place] for complete heart block with syncope  .  REPLACEMENT TOTAL KNEE BILATERAL     2006  . TEE WITHOUT CARDIOVERSION N/A 11/14/2014   Procedure: TRANSESOPHAGEAL ECHOCARDIOGRAM (TEE);   Surgeon: Donato Schultz, MD;  Location: [facility] ENDOSCOPY;  Service: Cardiovascular;  Laterality: N/A;         Family History  Problem Relation Age of Onset  . Alzheimer's disease Mother   . Tuberculosis Father   . Other Father     Spinal Meningitis   Social History:  reports that he quit smoking about 13 years ago. His smoking use included Cigarettes. He started smoking about 43 years ago. He has a 15.00 pack-year smoking history. He has never used smokeless tobacco. He reports that he drinks alcohol. He reports that he does not use drugs. Allergies: No Known Allergies       Medications Prior to Admission  Medication Sig Dispense Refill  . ALPRAZolam (XANAX) 0.25 MG tablet Take 0.25 mg by mouth at bedtime.    Marland Kitchen amiodarone (PACERONE) 200 MG tablet Take 1 tablet (200 mg total) by mouth 2 (two) times daily. 60 tablet 1  . aspirin 81 MG tablet Take 81 mg by mouth daily.    Marland Kitchen atorvastatin (LIPITOR) 80 MG tablet TAKE 1 TABLET BY MOUTH DAILY 90 tablet 3  . B Complex Vitamins (VITAMIN B COMPLEX PO) Take 1 tablet by mouth daily.     Marland Kitchen escitalopram (LEXAPRO) 10 MG tablet Take 10 mg by mouth at bedtime.    . furosemide (LASIX) 20 MG tablet Take 1 tablet (20mg ) by mouth daily. May take extra tablet for weight gain >3lbs 45 tablet 6  . HYDROcodone-acetaminophen (NORCO/VICODIN) 5-325 MG tablet Take 1 tablet by mouth 2 (two) times daily as needed for moderate pain.     Marland Kitchen ibuprofen (ADVIL,MOTRIN) 200 MG tablet Take 400 mg by mouth daily as needed for moderate pain.    . Magnesium 250 MG TABS Take 1 tablet (250 mg total) by mouth daily.  0  . metoprolol succinate (TOPROL-XL) 25 MG 24 hr tablet Take 12.5 mg by mouth 2 (two) times daily.     . potassium chloride (K-DUR) 10 MEQ tablet Take 1 tablet ( ) by mouth daily. Take extra 1 tablet if taking extra dose of lasix 45 tablet 3  . warfarin (COUMADIN) 2.5 MG tablet Take as directed by coumadin clinic (Patient taking differently: Take  1.25-2.5 mg by mouth See admin instructions. Pt takes 1.25mg  on Thursday, takes 2.5mg  on all other days) 40 tablet 1  . zolpidem (AMBIEN) 10 MG tablet Take 5-10 mg by mouth at bedtime.       Home: Home Living Family/patient expects to be discharged to:: Private residence Living Arrangements: Spouse/significant other Available Help at Discharge: Family Type of Home: House Home Access: Stairs to enter Secretary/administrator of Steps: 3 Entrance Stairs-Rails: Right Home Layout: One level Bathroom Shower/Tub: Walk-in shower  Lives With: Spouse  Functional History: Prior Function Level of Independence: Independent Comments: drives, active on property at home Functional Status:  Mobility: Bed Mobility Overal bed mobility: Needs Assistance Bed Mobility: Supine to Sit, Sit to Supine Rolling: Total assist, +2 for physical assistance Supine to sit: +2 for physical assistance, Max assist Sit to supine: +2 for physical assistance, Max assist General bed mobility comments: Pt required multiple cues and increased time for movement initiation; able to initiate trunk and extremity movement, requiring maxA x2 for sup<>sit Transfers Overall transfer level: Needs assistance Equipment used: None Transfers: Sit to/from Stand Sit to Stand: Max assist, +2 physical assistance  General transfer comment: MaxA x2 for sit-to-stand with bilat UE support on back of chair; pads used for pelvic control, and L knee block required for buckling.   ADL: ADL Overall ADL's : Needs assistance/impaired Eating/Feeding: NPO Grooming Details (indicate cue type and reason): Able to wash face with RUE once wash cloth presented. Pt was asking about toothbrush when gave him a swab with mouthwash on it it rubbed it on his lips despite telling him it was like a toothbrush General ADL Comments: Total assist for ADL at this time. Tolerated sitting EOB 10 minutes with VSS throughout; total assist provided for blocking  knees and posterior push.  Cognition: Cognition Overall Cognitive Status: Impaired/Different from baseline Arousal/Alertness: Awake/alert Orientation Level: Oriented to person, Disoriented to place, Disoriented to time, Disoriented to situation Attention: Sustained Sustained Attention: Impaired Sustained Attention Impairment: Verbal basic, Functional basic Memory: Impaired Memory Impairment: Decreased recall of new information Awareness: Impaired Awareness Impairment: Intellectual impairment, Emergent impairment, Anticipatory impairment Problem Solving: Impaired Problem Solving Impairment: Verbal basic Behaviors: Other (comment) (frequent laughing, appears to try to cover deficits) Safety/Judgment: Impaired Cognition Arousal/Alertness: Awake/alert Behavior During Therapy: WFL for tasks assessed/performed Overall Cognitive Status: Impaired/Different from baseline Area of Impairment: Following commands, Safety/judgement Following Commands: Follows one step commands inconsistently, Follows one step commands with increased time Safety/Judgement: Decreased awareness of safety, Decreased awareness of deficits General Comments: Pt continues to have improved arousal and able to follow some commands; most speech is still difficult to understand.  Difficult to assess due to: Level of arousal  Blood pressure 124/80, pulse (!) 122, temperature 97.5 F (36.4 C), temperature source Axillary, resp. rate 13, height 6\' 2"  (1.88 m), weight 112.3 kg (247 lb 9.2 oz), SpO2 94 %. Physical Exam  Vitals reviewed. Constitutional: He appears well-developed.  Obese  HENT:  Head: Normocephalic.  Right Ear: External ear normal.  Left Ear: External ear normal.  Nasogastric tube in place  Eyes: Right eye exhibits no discharge. Left eye exhibits no discharge.  Pupils sluggish to light  Neck: Normal range of motion. Neck supple. No thyromegaly present.  Cardiovascular:  Cardiac rate controlled    Respiratory: Effort normal.  Decreased breath sounds at the bases but clear to auscultation Receiving Neb  GI: Soft. Bowel sounds are normal. He exhibits no distension.  Musculoskeletal: He exhibits no edema or tenderness.  Neurological: He is alert.  Bilateral mittens in place. Unable to accurately assess MMT and sensation, but strength appears to be >/4/5 throughout DTRs appears to be symmetric  Skin: Skin is warm and dry.  Psychiatric: His affect is labile. His speech is delayed. He is agitated and slowed. Cognition and memory are impaired. He expresses inappropriate judgment.    Lab Results Last 24 Hours       Results for orders placed or performed during the hospital encounter of 12/03/16 (from the past 24 hour(s))  Glucose, capillary     Status: Abnormal   Collection Time: 12/08/16  8:23 AM  Result Value Ref Range   Glucose-Capillary 154 (H) 65 - 99 mg/dL   Comment 1 Notify RN    Comment 2 Document in Chart   Glucose, capillary     Status: Abnormal   Collection Time: 12/08/16 11:30 AM  Result Value Ref Range   Glucose-Capillary 149 (H) 65 - 99 mg/dL   Comment 1 Notify RN    Comment 2 Document in Chart   Glucose, capillary     Status: Abnormal   Collection Time: 12/08/16  3:51 PM  Result Value Ref Range   Glucose-Capillary 140 (H) 65 - 99 mg/dL  Glucose, capillary     Status: Abnormal   Collection Time: 12/08/16  7:40 PM  Result Value Ref Range   Glucose-Capillary 142 (H) 65 - 99 mg/dL  Glucose, capillary     Status: Abnormal   Collection Time: 12/08/16 11:16 PM  Result Value Ref Range   Glucose-Capillary 130 (H) 65 - 99 mg/dL  Protime-INR     Status: Abnormal   Collection Time: 12/09/16  3:10 AM  Result Value Ref Range   Prothrombin Time 19.1 (H) 11.4 - 15.2 seconds   INR 1.58   Glucose, capillary     Status: Abnormal   Collection Time: 12/09/16  3:29 AM  Result Value Ref Range   Glucose-Capillary 128 (H) 65 - 99 mg/dL     Imaging  Results (Last 48 hours)  No results found.    Assessment/Plan: Diagnosis: Traumatic SDH Labs and images independently reviewed.  Records reviewed and summated above.             [place] score:  >/IV             Speech to evaluate for Post traumatic amnesia and interval GOAT scores to assess progress.             NeuroPsych evaluation for behavorial assessment.             Provide environmental management by reducing the level of stimulation, tolerating restlessness when possible, protecting patient from harming self or others and reducing patient's cognitive confusion.             Address behavioral concerns include providing structured environments and daily routines.             Cognitive therapy to direct modular abilities in order to maintain goals        including problem solving, self regulation/monitoring, self management, attention, and memory.             Fall precautions; pt at risk for second impact syndrome             Prevention of secondary injury: monitor for hypotension, hypoxia, seizures or signs of increased ICP             AED:              Consider Propranolol for agitation and storming             Avoid medications that could impair cognitive abilities, such as anticholinergics, antihistaminic, benzodiazapines, narcotics, etc when possible   1. Does the need for close, 24 hr/day medical supervision in concert with the patient's rehab needs make it unreasonable for this patient to be served in a less intensive setting? Yes  2. Co-Morbidities requiring supervision/potential complications: obesity (Body mass index is 31.79 kg/m., diet and exercise education, encourage weight loss to increase endurance and promote overall health), alcohol use (CIWA), COPD (cont supplemental O2 qHS, monitor RR and O2 sats with increased activity), obstructive sleep apnea (Cont CPAP, monitor for daytime somnolence), atrial fibrillation/AVR/CABG/PPM (cont meds, monitor with increased  activity), confusion with delirium, seizure (cont meds), NPO (advance diet as tolerated), Bradycardia (monitor HR with increased physical activity), hyperglycemia (Monitor in accordance with exercise and adjust meds as necessary), agitation (wean IV meds), hypokalemia (continue to monitor and replete as necessary), mild hypernatremia (cont to monitor, treat if necessary), leukocytosis (cont to monitor for signs and symptoms of infection, further workup if indicated), macrocytic anemia (  transfuse if necessary to ensure appropriate perfusion for increased activity tolerance), Thrombocytopenia (< 60,000/mm3 no resistive exercise) 3. Due to bladder management, bowel management, safety, skin/wound care, disease management, medication administration, pain management and patient education, does the patient require 24 hr/day rehab nursing? Yes 4. Does the patient require coordinated care of a physician, rehab nurse, PT (1-2 hrs/day, 5 days/week), OT (1-2 hrs/day, 5 days/week) and SLP (1-2 hrs/day, 5 days/week) to address physical and functional deficits in the context of the above medical diagnosis(es)? Yes Addressing deficits in the following areas: balance, endurance, locomotion, strength, transferring, bowel/bladder control, bathing, dressing, feeding, grooming, toileting, cognition, speech, language, swallowing and psychosocial support 5. Can the patient actively participate in an intensive therapy program of at least 3 hrs of therapy per day at least 5 days per week? Potentially 6. The potential for patient to make measurable gains while on inpatient rehab is excellent 7. Anticipated functional outcomes upon discharge from inpatient rehab are min assist  with PT, min assist with OT, min assist with SLP. 8. Estimated rehab length of stay to reach the above functional goals is: 25-30 days. 9. Does the patient have adequate social supports and living environment to accommodate these discharge functional goals?  Potentially 10. Anticipated D/C setting: SNF 11. Anticipated post D/C treatments: SNF 12. Overall Rehab/Functional Prognosis: good  RECOMMENDATIONS: This patient's condition is appropriate for continued rehabilitative care in the following setting: Will consider CIR to decrease burden of care once medically stable as wife can only supply supervision at discharge.  Patient has agreed to participate in recommended program. Potentially Note that insurance prior authorization may be required for reimbursement for recommended care.  Comment: Rehab Admissions Coordinator to follow up.  Maryla MorrowAnkit Patel, MD, [place] DomFAAPMR Charlton AmorANGIULLI,DANIEL J., PA-C 12/09/2016    Revision History                             Routing History

## 2016-12-19 NOTE — Progress Notes (Signed)
Standley BrookingBarbara G Chavie Kolinski, RN Rehab Admission Coordinator Signed Physical Medicine and Rehabilitation  PMR Pre-admission Date of Service: 12/19/2016 11:48 AM  Related encounter: [facility] (Current[facility]       [] Hide copied text PMR Admission Coordinator Pre-Admission Assessment  Patient: Alex DuhamelJohn R Pry is an 83 y.o., male MRN: [id number] DOB: 1931/06/12 Height: 6\' 2"  (188 cm) Weight: 103.2 kg (227 lb 8.2 oz)                                                                                                                                                  Insurance Information HMO: yes    PPO:      PCP:      IPA:      80/20:      OTHER: Medicare advantage plan PRIMARY: [employer] Medicare      Policy#: [id number]      Subscriber: pt CM Name: Alex [name]      Phone#: [phone]     Fax#: [phone] Pre-Cert#: [id number]   Approved for 7 days  F/u Case manager is Velvet Bathemily Davis phone [phone] fax: EPIC access  Employer: retired Benefits:  Phone #: [phone]     Name: 12/09/16 Eff. Date: 10/27/16     Deduct: none      Out of Pocket Max: $4400      Life Max: none CIR: $345 co pay per day days 1-5 then covers 100%      SNF: no co pay days 1-20; $160 co pay per day days 21-48; no co pay days 49-100 Outpatient: $40 co pay per visit     Co-Pay: visits per medical neccesity Home Health: 100%      Co-Pay: visits per medical neccesity DME: 80%     Co-Pay: 20% Providers: in network  SECONDARY: none        Medicaid Application Date:       Case Manager:  Disability Application Date:       Case Worker:   Emergency Contact Information        Contact Information    Name Relation Home Work [employer]   [name],Alex Son   [phone]   Alex [name],[place] Spouse [phone]  [phone]     Current Medical History  Patient Admitting Diagnosis: Traumatic SDH  History of Present Illness: : Alex BlanchJohn R Reavesis a 83 y.o.right handed  malewith history of morbid obesity, alcohol use, COPD with nighttime oxygen, obstructive sleep apnea with CPAP, atrial fibrillation/AVR/CABG/PPM maintained on aspirin and Coumadin.  Presented 12/03/2016 after a recent fall and struck his head without loss of consciousness. Develop dry heaving gradual onset of moderate squeezing chest pain shortness of breath. CT of the head showed left parietal scalp hematoma. Acute right hemispheric subdural hematoma with maximal thickness in the right posterior parietal region measuring 15 mm.  Right to left shift of 7.5 mm. Troponin negative. INR of 4.14 that was reversed. Neurosurgery Dr. Marikay Alar advised conservative care. Hospital course confusion with delirium he had been started on Precedex. Soft restraints were added for patient safety. Reported grand mal seizure 12/04/2016 required intubation and currently maintained on Keppra. EEG showed occasional low amplitude epileptiform discharges over the right posterior temporal region 3 clinicoelectrographic seizures captured arising from the right hemisphere lasting 2-4 minutes. Patient remains off anticoagulation due to subdural hematoma. Patient initially NPOwith nasogastric tube for nutritional support however patient did pull his nasogastric tube out. A swallow study 12/11/2016 placed on a full liquid diet.. Latest cranial CT scan 12/10/2016 reviewed, showingstable right subdural hematoma with stable mass effect. Per report, no new large territory infarct or parenchymal hemorrhage identified. Physical andoccupational therapy evaluations completed with recommendations of physical medicine rehabilitation consult.Patient was admitted for a comprehensive rehabilitation program02/14/2018.   Patient was slow progressive gains. Cardiology consulted 02/16/2018with increasing shortness of breath, tachycardia and wheezing. Chest x-ray consistent with pulmonary edema with small-to-moderate posterior pleural effusion. He did  receive intravenous Lasix. His amiodarone was adjusted andLopressor was titrateddue to tachycardia. Patient continued on his full liquid diet. 12/15/2016 patient more lethargic and difficult to arouse. Follow-up CT of the head showed interval increase in size of large broad-based right sided subdural hematoma now measuring 21.5 mm maximal thickness versus 9 mm. Increased mass effect upon the right lateral ventricle with midline shift to the left by 14.4 mm versus prior 7.4. Neurosurgery was consulted and patient was discharged to acute care services 12/15/2016 for ongoing monitoring in guarded condition.   Underwent right frontotemporal parietal craniotomy for evacuation of subdural hematoma 12/16/2016 per Dr. Bevely Palmer. Maintained on Keppra for seizure prophylaxis as well as Decadron protocol.His low-dose aspirin was resumed for history of atrial fibrillation but no Coumadin at this time due to SDH.Mild hypokalemia with supplement added. Physical and occupational therapy evaluations resumed.   Past Medical History      Past Medical History:  Diagnosis Date  . Aortic stenosis    s/p AVR by Dr Laneta Simmers  . COPD (chronic obstructive pulmonary disease) (HCC)   . History of coronary artery disease    status post stenting of the marginal circumflex in 12/2003 and again in 2009  . Hyperlipidemia   . Morbid obesity (HCC)    weight 243 pounds, BMI 31.2kg/m2, BSA 2.36 square meters  . Obstructive sleep apnea    compliant with CPAP  . Persistent atrial fibrillation (HCC)     Family History  family history includes Alzheimer's disease in his mother; Other in his father; Tuberculosis in his father.  Prior Rehab/Hospitalizations:  Has the patient had major surgery during 100 days prior to admission? No  Current Medications   Current Facility-Administered Medications:  .  ALPRAZolam Prudy Feeler) tablet 0.25 mg, 0.25 mg, Oral, QHS, Vincent J Costella, PA-C, 0.25 mg at 12/18/16 2207 .   amiodarone (PACERONE) tablet 200 mg, 200 mg, Oral, BID, Vincent J Costella, PA-C, 200 mg at 12/19/16 1040 .  aspirin EC tablet 81 mg, 81 mg, Oral, Daily, [employer], PA-C, 81 mg at 12/19/16 1041 .  atorvastatin (LIPITOR) tablet 80 mg, 80 mg, Oral, Daily, [employer], PA-C, 80 mg at 12/19/16 1041 .  B-complex with vitamin C tablet 1 tablet, 1 tablet, Oral, Daily, [employer], PA-C, 1 tablet at 12/19/16 1041 .  bisacodyl (DULCOLAX) suppository 10 mg, 10 mg, Rectal, Daily PRN, Alyson Ingles, PA-C .  dexamethasone (DECADRON) injection 4 mg, 4 mg, Intravenous, Q8H, Loura Halt Ditty, MD, 4 mg at 12/19/16 1048 .  escitalopram (LEXAPRO) tablet 10 mg, 10 mg, Oral, QHS, Vincent J Costella, PA-C, 10 mg at 12/18/16 2207 .  furosemide (LASIX) tablet 20 mg, 20 mg, Oral, Daily, [employer], PA-C, 20 mg at 12/19/16 1040 .  HYDROcodone-acetaminophen (NORCO/VICODIN) 5-325 MG per tablet 1 tablet, 1 tablet, Oral, BID PRN, Alyson Ingles, PA-C, 1 tablet at 12/18/16 1549 .  ibuprofen (ADVIL,MOTRIN) tablet 400 mg, 400 mg, Oral, Daily PRN, Darci Current Costella, PA-C .  labetalol (NORMODYNE,TRANDATE) injection 10-40 mg, 10-40 mg, Intravenous, Q10 min PRN, Darci Current Costella, PA-C .  levETIRAcetam (KEPPRA) 500 mg in sodium chloride 0.9 % 100 mL IVPB, 500 mg, Intravenous, Q12H, Vincent J Costella, PA-C, 500 mg at 12/19/16 0332 .  magnesium oxide (MAG-OX) tablet 200 mg, 200 mg, Oral, Daily, Loura Halt Ditty, MD, 200 mg at 12/19/16 1040 .  metoprolol succinate (TOPROL-XL) 24 hr tablet 12.5 mg, 12.5 mg, Oral, BID, Vincent J Costella, PA-C, 12.5 mg at 12/19/16 1041 .  polyethylene glycol (MIRALAX / GLYCOLAX) packet 17 g, 17 g, Oral, Daily PRN, Darci Current Costella, PA-C .  potassium chloride (K-DUR) CR tablet 10 mEq, 10 mEq, Oral, Daily, [employer], PA-C, 10 mEq at 12/19/16 1040 .  promethazine (PHENERGAN) tablet 12.5-25 mg, 12.5-25 mg, Oral, Q4H PRN, Darci Current Costella, PA-C .   senna (SENOKOT) tablet 8.6 mg, 1 tablet, Oral, BID, Vincent J Costella, PA-C, 8.6 mg at 12/19/16 1040 .  sodium phosphate (FLEET) 7-19 GM/118ML enema 1 enema, 1 enema, Rectal, Once PRN, Darci Current Costella, PA-C .  zolpidem (AMBIEN) tablet 5 mg, 5 mg, Oral, QHS, Vincent J Costella, PA-C, 5 mg at 12/18/16 2205  Patients Current Diet: DIET - DYS 1 Room service appropriate? Yes; Fluid consistency: Thin Diet - low sodium heart healthy  Precautions / Restrictions Precautions Precautions: Fall Precaution Comments: monitor HR/ BP Restrictions Weight Bearing Restrictions: No   Has the patient had 2 or more falls or a fall with injury in the past year?No  Prior Activity Level Independent and driving pta. Had balance issues but refused AD. Daily alcohol use.  Home Assistive Devices / Equipment Home Assistive Devices/Equipment: CPAP Home Equipment:  (oxygen at 2 liters nasal cannula at HS pta)  Prior Device Use: Indicate devices/aids used by the patient prior to current illness, exacerbation or injury? none  Prior Functional Level Prior Function Level of Independence: Needs assistance, Independent Gait / Transfers Assistance Needed: independent pta ADL's / Homemaking Assistance Needed: independent Communication / Swallowing Assistance Needed: independent Comments: drives, active on property at home  Self Care: Did the patient need help bathing, dressing, using the toilet or eating?  Independent  Indoor Mobility: Did the patient need assistance with walking from room to room (with or without device)? Independent  Stairs: Did the patient need assistance with internal or external stairs (with or without device)? Independent  Functional Cognition: Did the patient need help planning regular tasks such as shopping or remembering to take medications? Independent  Current Functional Level Cognition  Arousal/Alertness: Lethargic Overall Cognitive Status: Impaired/Different from  baseline Current Attention Level: Selective Orientation Level: Oriented to person, Oriented to time, Oriented to situation, Disoriented to place Following Commands: Follows one step commands inconsistently, Follows one step commands with increased time Safety/Judgement: Decreased awareness of safety, Decreased awareness of deficits (sits prematurely and without warning; on one occassion he sat back down on bed without warning  and we figured out it was because he needed to urinate--but he could not tell us this) General Comments: With grooming items and feeding utensil he is unable to tell me the name of objects but he is able to use them the way they are intended 25% of time Attention: Sustained Sustained Attention: Impaired Sustained Attention Impairment: Verbal basic, Functional basic Memory: Impaired Memory Impairment: Decreased recall of new information Awareness: Impaired Awareness Impairment: Intellectual impairment Problem Solving: Impaired Problem Solving Impairment: Verbal basic, Functional basic Safety/Judgment: Impaired    Extremity Assessment (includes Sensation/Coordination)  Upper Extremity Assessment: Defer to OT evaluation LUE Deficits / Details: Active movement noted but pt difficulty following commands for formal testing.  Lower Extremity Assessment: Generalized weakness    ADLs  Overall ADL's : Needs assistance/impaired Grooming: Wash/dry face, Brushing hair Grooming Details (indicate cue type and reason): Pt able to wash face once washcloth presented to him. He used the comb as it is intended. The toothbrush he thought intially was to clean his fingernails Toilet Transfer: Moderate assistance, +2 for physical assistance Toilet Transfer Details (indicate cue type and reason): stand turn to recliner with pt wanting to sit prematurely Toileting- Clothing Manipulation and Hygiene: Total assistance Toileting - Clothing Manipulation Details (indicate cue type and  reason): to use urinal at side of bed General ADL Comments: Pt with difficulty following commands during session and would require max assist for all ADL. Attempted sit to stand from EOB x3 with total assist +2 and pt unable to acheive full standing.    Mobility  Overal bed mobility: Needs Assistance Bed Mobility: Supine to Sit Supine to sit: Max assist Sit to supine: +2 for physical assistance, Total assist General bed mobility comments: Pt required increased time with + use of railing and elevated HOB.  Min assist for LE advancement and elevation of trunk to edge of bed.      Transfers  Overall transfer level: Needs assistance Equipment used: Rolling walker (2 wheeled) Transfers: Sit to/from Stand, [employer] Transfers Sit to Stand: Mod assist, +2 physical assistance Stand pivot transfers: Mod assist, +2 physical assistance (pt still wanting to sit too soon---before he totally gets in line with chair) General transfer comment: x2 standing trials at RW, significant weakness and coordination deficits on LUE.  Pt required cues to push from bed and extend hips, knees, trunk and head.  Pt returned to sitting on 1st trial to use the urinal.  On second attempt patient able to take steps to chair but began to sit impulsively.      Ambulation / Gait / Stairs / Wheelchair Mobility  Ambulation/Gait Ambulation/Gait assistance: Max assist, +2 physical assistance Ambulation Distance (Feet):  (steps from bed to chair.  ) Assistive device: Rolling walker (2 wheeled) Gait Pattern/deviations: Shuffle, Staggering right General Gait Details: Pt performed increased mobility advancing steps from bed to chair.  Pt remains to present with poor cognition.  Upon advancing steps from bed to chair patient began to sit slowly requiring cues to remain upright.   Gait velocity interpretation: Below normal speed for age/gender    Posture / Balance Dynamic Sitting Balance Sitting balance - Comments: Able to sit  EOB without UE support. Balance Overall balance assessment: Needs assistance Sitting-balance support: Feet supported, No upper extremity supported Sitting balance-[name]: Good Sitting balance - Comments: Able to sit EOB without UE support. Standing balance support: Bilateral upper extremity supported Standing balance-[name]: Poor Standing balance comment: Pt reliant on RW with both hands in standing  Special needs/care consideration BiPAP/CPAP did no use pta. Only used in ED this admission CPM  N/a Continuous Drip IV  N/a Dialysis  N/a Life Vest  N/a Oxygen at 2 liters nasal cannula at HS pta Special Bed  N/a Trach Size  N/a Wound Vac (area)  N/a Skin surgical incision; ecchymosis b UE                            Bowel mgmt: incontinent LBM 2/23 Bladder mgmt: incontinent Diabetic mgmt  N/a Pt is HOH per wife Decreased safety awareness therefore needs bed and chair alarms Mitts for safety   Previous Home Environment Living Arrangements: Spouse/significant other  Lives With: Spouse Available Help at Discharge: Family, Available 24 hours/day Type of Home: House Home Layout: One level Home Access: Stairs to enter Entrance Stairs-Rails: Right Entrance Stairs-Number of Steps: 3 Bathroom Shower/Tub: Health visitor: Standard Bathroom Accessibility: Yes How Accessible: Accessible via walker Home Care Services: No Additional Comments: wife can provide supervision level  Discharge Living Setting Plans for Discharge Living Setting: Patient's home, Lives with (comment) Type of Home at Discharge: House Discharge Home Layout: One level Discharge Home Access: Stairs to enter Entrance Stairs-Rails: Right Entrance Stairs-Number of Steps: 3 Discharge Bathroom Shower/Tub: Walk-in shower Discharge Bathroom Toilet: Standard Discharge Bathroom Accessibility: Yes How Accessible: Accessible via walker Does the patient have any problems obtaining your  medications?: No  Social/Family/Support Systems Patient Roles: Spouse, Parent Contact Information: [place] Santa, wife Anticipated Caregiver: wife and hired Child psychotherapist Information: see above Ability/Limitations of Caregiver: wife can provide supervision level, would hire assist if needed Caregiver Availability: 24/7 Discharge Plan Discussed with Primary Caregiver: Yes Is Caregiver In Agreement with Plan?: Yes Does Caregiver/Family have Issues with Lodging/Transportation while Pt is in Rehab?: No  Goals/Additional Needs Patient/Family Goal for Rehab: supervision to min assist with PT, OT, and SLP Expected length of stay: [facility]OS 20 -25 days Special Service Needs: decreased saferty awarness; needs bed and chair alarm Additional Information: Pt with history of alcohol abuse per wife Pt/Family Agrees to Admission and willing to participate: Yes Program Orientation Provided & Reviewed with Pt/Caregiver Including Roles  & Responsibilities: Yes  Decrease burden of Care through IP rehab admission: n/a  Possible need for SNF placement upon discharge: If pt does not reach supervision level or light min assist that wife can manage at home, SNF is an option per wife. Pt does have a long term care policy for care in the home as well as SNF. Son has been discussing SNF option with his Mom after CIR per wife.  Patient Condition: This patient's medical and functional status has changed since the consult dated: 12/08/2016 in which the Rehabilitation Physician determined and documented that the patient's condition is appropriate for intensive rehabilitative care in an inpatient rehabilitation facility. See "History of Present Illness" (above) for medical update. Functional changes are: overall max assist to total s/p craniotomy. Patient's medical and functional status update has been discussed with the Rehabilitation physician and patient remains appropriate for inpatient  rehabilitation. Will admit to inpatient rehab today.  Preadmission Screen Completed By:  Clois Dupes, 12/19/2016 12:04 PM ______________________________________________________________________   Discussed status with Dr. Riley Kill on 12/19/2016 at  1619 and received telephone approval for admission today.  Admission Coordinator:  Clois Dupes, time 1610 Date 12/19/2016       Cosigned by: [name], MD at 12/19/2016 4:27 PM  Revision History

## 2016-12-19 NOTE — Progress Notes (Signed)
Pt seen and examined. No issues overnight. Wife reports he has been very interactive. Does not appear to be complaining of anything. No slurring speech or facial droop. Per wife - tolerating po "when he wants to"  EXAM: Temp:  [97.4 F (36.3 C)-97.9 F (36.6 C)] 97.6 F (36.4 C) (02/23 0619) Pulse Rate:  [53-68] 53 (02/23 0619) Resp:  [16-20] 18 (02/23 0619) BP: (114-165)/(59-89) 165/81 (02/23 0619) SpO2:  [93 %-99 %] 96 % (02/23 0619) Intake/Output      02/22 0701 - 02/23 0700 02/23 0701 - 02/24 0700   P.O. 120    I.V. (mL/kg)     Total Intake(mL/kg) 120 (1.2)    Urine (mL/kg/hr)     Total Output       Net +120          Urine Occurrence 3 x    Stool Occurrence 3 x     Awake and alert Follows commands throughout Moving all extremities Wound is without drainage or signs of infection.  No issues overnight. Appears to be doing well He has been discharged. We are just pending CIR approval Call with any concerns

## 2016-12-19 NOTE — Progress Notes (Signed)
I have insurance approval and bed available to admit pt to inpt rehab today. I met with wife at pt bedside and she is in agreement to admit. I will make the arrangements to admit today. [phone]

## 2016-12-19 NOTE — Progress Notes (Signed)
Occupational Therapy Treatment Patient Details Name: Alex DuhamelJohn R [name] MRN: [id number] DOB: 11-09-1930 Today's Date: 12/19/2016    History of present illness Pt is an 83 y.o. male s/p CRANIOTOMY HEMATOMA EVACUATION SUBDURAL (Right).    OT comments  This 83 yo male making progress towards basic mobility and self care skills this session with cognition being a contributor to decreased independence. Pt pta was totally independent with all basic ADLs and did some IADLs as well and he will greatly benefit from continued OT on CIR for the intensity to increase independence and be able to  return home with wife.  Follow Up Recommendations  CIR;Supervision/Assistance - 24 hour    Equipment Recommendations  Other (comment) (TBD at next venue)    Recommendations for Other Services Rehab consult    Precautions / Restrictions Precautions Precautions: Fall Precaution Comments: monitor HR/ BP Restrictions Weight Bearing Restrictions: No       Mobility Bed Mobility Overal bed mobility: Needs Assistance Bed Mobility: Supine to Sit     Supine to sit: Max assist        Transfers Overall transfer level: Needs assistance Equipment used: Rolling walker (2 wheeled) Transfers: Sit to/from [employer]Stand;Stand Pivot Transfers Sit to Stand: Mod assist;+2 physical assistance Stand pivot transfers: Mod assist;+2 physical assistance (pt still wanting to sit too soon---before he totally gets in line with chair)            Balance Overall balance assessment: Needs assistance Sitting-balance support: Feet supported;No upper extremity supported Sitting balance-[name]: Good Sitting balance - Comments: Able to sit EOB without UE support.   Standing balance support: Bilateral upper extremity supported Standing balance-[name]: Poor Standing balance comment: Pt reliant on RW with both hands in standing                   ADL Overall ADL's : Needs assistance/impaired     Grooming:  Wash/dry face;Brushing hair Grooming Details (indicate cue type and reason): Pt able to wash face once washcloth presented to him. He used the comb as it is intended. The toothbrush he thought intially was to clean his fingernails                 Toilet Transfer: Moderate assistance;+2 for physical assistance Toilet Transfer Details (indicate cue type and reason): stand turn to recliner with pt wanting to sit prematurely Toileting- Clothing Manipulation and Hygiene: Total assistance Toileting - Clothing Manipulation Details (indicate cue type and reason): to use urinal at side of bed              Vision                 Additional Comments: When looking left of midline there is a delay in being able to tell me how many fingers I am holding up compared to looking right of midline          Cognition   Behavior During Therapy: Impulsive Overall Cognitive Status: Impaired/Different from baseline Area of Impairment: Following commands;Safety/judgement;Problem solving Orientation Level: Disoriented to;Place;Time (originally he thought it was 2011, did not know he was at hospital but then all of the sudden out of the blue he stated I AM at the hosptial) Current Attention Level: Selective Memory:  (difficulty remembering the name of one of his dogs--which wife said yesterday he could without issues)  Following Commands: Follows one step commands inconsistently;Follows one step commands with increased time Safety/Judgement: Decreased awareness of safety;Decreased awareness of deficits (sits prematurely and  without warning; on one occassion he sat back down on bed without warning and we figured out it was because he needed to urinate--but he could not tell us this)   Problem Solving: Slow processing;Decreased initiation;Difficulty sequencing;Requires verbal cues;Requires tactile cues (He had been wanting to get OOB all morning per wife even throwing his legs over the bed rails; but  when I asked him to sit up he could not problem solve how to do it) General Comments: With grooming items and feeding utensil he is unable to tell me the name of objects but he is able to use them the way they are intended 25% of time                 Pertinent Vitals/ Pain       Pain Assessment: No/denies pain         Frequency  Min 3X/week        Progress Toward Goals  OT Goals(current goals can now be found in the care plan section)  Progress towards OT goals: Progressing toward goals     Plan Discharge plan remains appropriate       End of Session Equipment Utilized During Treatment: Gait belt;Rolling walker  OT Visit Diagnosis: Hemiplegia and hemiparesis;Unsteadiness on feet (R26.81) Hemiplegia - Right/Left: Right Hemiplegia - dominant/non-dominant: Non-Dominant Hemiplegia - caused by: Nontraumatic intracerebral hemorrhage   Activity Tolerance Patient tolerated treatment well   Patient Left in chair;with call bell/phone within reach;with chair alarm set   Nurse Communication  (Pt needs new dressing on his head)        Time: 1610-9604 OT Time Calculation (min): 34 min  Charges: OT General Charges $OT Visit: 1 Procedure OT Treatments $Self Care/Home Management : 23-37 mins  Ignacia Palma, OTR/L [phone] 12/19/2016

## 2016-12-19 NOTE — PMR Pre-admission (Signed)
PMR Admission Coordinator Pre-Admission Assessment  Patient: Alex [name] is an 83 y.o., male MRN: [id number] DOB: 1930/12/31 Height: 6\' 2"  (188 cm) Weight: 103.2 kg (227 lb 8.2 oz)              Insurance Information HMO: yes    PPO:      PCP:      IPA:      80/20:      OTHER: Medicare advantage plan PRIMARY: [employer] Medicare      Policy#: [id number]      Subscriber: pt CM Name: Gweneth Dimitri      Phone#: [phone]     Fax#: [phone] Pre-Cert#: [id number]   Approved for 7 days  F/u Case manager is Velvet Bathe phone [phone] fax: EPIC access  Employer: retired Benefits:  Phone #: [phone]     Name: 12/09/16 Eff. Date: 10/27/16     Deduct: none      Out of Pocket Max: $4400      Life Max: none CIR: $345 co pay per day days 1-5 then covers 100%      SNF: no co pay days 1-20; $160 co pay per day days 21-48; no co pay days 49-100 Outpatient: $40 co pay per visit     Co-Pay: visits per medical neccesity Home Health: 100%      Co-Pay: visits per medical neccesity DME: 80%     Co-Pay: 20% Providers: in network  SECONDARY: none        Medicaid Application Date:       Case Manager:  Disability Application Date:       Case Worker:   Emergency Contact Information Contact Information    Name Relation Home Work [place] Son   [phone]   [place], [place]ut[place] [phone]  [phone]     Current Medical History  Patient Admitting Diagnosis: Traumatic SDH  History of Present Illness: : Alex [name] a 83 y.o.right handed malewith history of morbid obesity, alcohol use, COPD with nighttime oxygen, obstructive sleep apnea with CPAP, atrial fibrillation/AVR/CABG/PPM maintained on aspirin and Coumadin.  Presented 12/03/2016 after a recent fall and struck his head without loss of consciousness. Develop dry heaving gradual onset of moderate squeezing chest pain shortness of breath. CT of the head showed left parietal scalp hematoma. Acute right hemispheric  subdural hematoma with maximal thickness in the right posterior parietal region measuring 15 mm. Right to left shift of 7.5 mm. Troponin negative. INR of 4.14 that was reversed. Neurosurgery Dr. Marikay Alar advised conservative care. Hospital course confusion with delirium he had been started on Precedex. Soft restraints were added for patient safety. Reported grand mal seizure 12/04/2016 required intubation and currently maintained on Keppra. EEG showed occasional low amplitude epileptiform discharges over the right posterior temporal region 3 clinicoelectrographic seizures captured arising from the right hemisphere lasting 2-4 minutes. Patient remains off anticoagulation due to subdural hematoma. Patient initially NPOwith nasogastric tube for nutritional support however patient did pull his nasogastric tube out. A swallow study 12/11/2016 placed on a full liquid diet.. Latest cranial CT scan 12/10/2016 reviewed, showingstable right subdural hematoma with stable mass effect. Per report, no new large territory infarct or parenchymal hemorrhage identified. Physical andoccupational therapy evaluations completed with recommendations of physical medicine rehabilitation consult.Patient was admitted for a comprehensive rehabilitation program 12/10/2016.   Patient was slow progressive gains. Cardiology consulted 12/12/2016 with increasing shortness of breath, tachycardia and wheezing. Chest x-ray consistent with pulmonary edema with small-to-moderate posterior pleural effusion.  He did receive intravenous Lasix. His amiodarone was adjusted and Lopressor was titrated due to tachycardia. Patient continued on his full liquid diet. 12/15/2016 patient more lethargic and difficult to arouse. Follow-up CT of the head showed interval increase in size of large broad-based right sided subdural hematoma now measuring 21.5 mm maximal thickness versus 9 mm. Increased mass effect upon the right lateral ventricle with midline shift  to the left by 14.4 mm versus prior 7.4. Neurosurgery was consulted and patient was discharged to acute care services 12/15/2016 for ongoing monitoring in guarded condition.   Underwent right frontotemporal parietal craniotomy for evacuation of subdural hematoma 12/16/2016 per Dr. Bevely Palmer. Maintained on Keppra for seizure prophylaxis as well as Decadron protocol. His low-dose aspirin was resumed for history of atrial fibrillation but no Coumadin at this time due to SDH. Mild hypokalemia with supplement added. Physical and occupational therapy evaluations resumed.   Past Medical History  Past Medical History:  Diagnosis Date  . Aortic stenosis    s/p AVR by Dr Laneta Simmers  . COPD (chronic obstructive pulmonary disease) (HCC)   . History of coronary artery disease    status post stenting of the marginal circumflex in 12/2003 and again in 2009  . Hyperlipidemia   . Morbid obesity (HCC)    weight 243 pounds, BMI 31.2kg/m2, BSA 2.36 square meters  . Obstructive sleep apnea    compliant with CPAP  . Persistent atrial fibrillation (HCC)     Family History  family history includes Alzheimer's disease in his mother; Other in his father; Tuberculosis in his father.  Prior Rehab/Hospitalizations:  Has the patient had major surgery during 100 days prior to admission? No  Current Medications   Current Facility-Administered Medications:  .  ALPRAZolam Prudy Feeler) tablet 0.25 mg, 0.25 mg, Oral, QHS, Vincent J Costella, PA-C, 0.25 mg at 12/18/16 2207 .  amiodarone (PACERONE) tablet 200 mg, 200 mg, Oral, BID, Vincent J Costella, PA-C, 200 mg at 12/19/16 1040 .  aspirin EC tablet 81 mg, 81 mg, Oral, Daily, [employer], PA-C, 81 mg at 12/19/16 1041 .  atorvastatin (LIPITOR) tablet 80 mg, 80 mg, Oral, Daily, [employer], PA-C, 80 mg at 12/19/16 1041 .  B-complex with vitamin C tablet 1 tablet, 1 tablet, Oral, Daily, [employer], PA-C, 1 tablet at 12/19/16 1041 .  bisacodyl (DULCOLAX)  suppository 10 mg, 10 mg, Rectal, Daily PRN, Vincent J Costella, PA-C .  dexamethasone (DECADRON) injection 4 mg, 4 mg, Intravenous, Q8H, Loura Halt Ditty, MD, 4 mg at 12/19/16 1048 .  escitalopram (LEXAPRO) tablet 10 mg, 10 mg, Oral, QHS, Vincent J Costella, PA-C, 10 mg at 12/18/16 2207 .  furosemide (LASIX) tablet 20 mg, 20 mg, Oral, Daily, [employer], PA-C, 20 mg at 12/19/16 1040 .  HYDROcodone-acetaminophen (NORCO/VICODIN) 5-325 MG per tablet 1 tablet, 1 tablet, Oral, BID PRN, Alyson Ingles, PA-C, 1 tablet at 12/18/16 1549 .  ibuprofen (ADVIL,MOTRIN) tablet 400 mg, 400 mg, Oral, Daily PRN, Darci Current Costella, PA-C .  labetalol (NORMODYNE,TRANDATE) injection 10-40 mg, 10-40 mg, Intravenous, Q10 min PRN, Darci Current Costella, PA-C .  levETIRAcetam (KEPPRA) 500 mg in sodium chloride 0.9 % 100 mL IVPB, 500 mg, Intravenous, Q12H, Vincent J Costella, PA-C, 500 mg at 12/19/16 0332 .  magnesium oxide (MAG-OX) tablet 200 mg, 200 mg, Oral, Daily, Loura Halt Ditty, MD, 200 mg at 12/19/16 1040 .  metoprolol succinate (TOPROL-XL) 24 hr tablet 12.5 mg, 12.5 mg, Oral, BID, Vincent J Costella, PA-C, 12.5 mg  at 12/19/16 1041 .  polyethylene glycol (MIRALAX / GLYCOLAX) packet 17 g, 17 g, Oral, Daily PRN, Darci Current Costella, PA-C .  potassium chloride (K-DUR) CR tablet 10 mEq, 10 mEq, Oral, Daily, [employer], PA-C, 10 mEq at 12/19/16 1040 .  promethazine (PHENERGAN) tablet 12.5-25 mg, 12.5-25 mg, Oral, Q4H PRN, Darci Current Costella, PA-C .  senna (SENOKOT) tablet 8.6 mg, 1 tablet, Oral, BID, Vincent J Costella, PA-C, 8.6 mg at 12/19/16 1040 .  sodium phosphate (FLEET) 7-19 GM/118ML enema 1 enema, 1 enema, Rectal, Once PRN, Darci Current Costella, PA-C .  zolpidem (AMBIEN) tablet 5 mg, 5 mg, Oral, QHS, Vincent J Costella, PA-C, 5 mg at 12/18/16 2205  Patients Current Diet: DIET - DYS 1 Room service appropriate? Yes; Fluid consistency: Thin Diet - low sodium heart healthy  Precautions /  Restrictions Precautions Precautions: Fall Precaution Comments: monitor HR/ BP Restrictions Weight Bearing Restrictions: No   Has the patient had 2 or more falls or a fall with injury in the past year?No  Prior Activity Level Independent and driving pta. Had balance issues but refused AD. Daily alcohol use.  Home Assistive Devices / Equipment Home Assistive Devices/Equipment: CPAP Home Equipment:  (oxygen at 2 liters nasal cannula at HS pta)  Prior Device Use: Indicate devices/aids used by the patient prior to current illness, exacerbation or injury? none  Prior Functional Level Prior Function Level of Independence: Needs assistance, Independent Gait / Transfers Assistance Needed: independent pta ADL's / Homemaking Assistance Needed: independent Communication / Swallowing Assistance Needed: independent Comments: drives, active on property at home  Self Care: Did the patient need help bathing, dressing, using the toilet or eating?  Independent  Indoor Mobility: Did the patient need assistance with walking from room to room (with or without device)? Independent  Stairs: Did the patient need assistance with internal or external stairs (with or without device)? Independent  Functional Cognition: Did the patient need help planning regular tasks such as shopping or remembering to take medications? Independent  Current Functional Level Cognition  Arousal/Alertness: Lethargic Overall Cognitive Status: Impaired/Different from baseline Current Attention Level: Selective Orientation Level: Oriented to person, Oriented to time, Oriented to situation, Disoriented to place Following Commands: Follows one step commands inconsistently, Follows one step commands with increased time Safety/Judgement: Decreased awareness of safety, Decreased awareness of deficits (sits prematurely and without warning; on one occassion he sat back down on bed without warning and we figured out it was because he  needed to urinate--but he could not tell us this) General Comments: With grooming items and feeding utensil he is unable to tell me the name of objects but he is able to use them the way they are intended 25% of time Attention: Sustained Sustained Attention: Impaired Sustained Attention Impairment: Verbal basic, Functional basic Memory: Impaired Memory Impairment: Decreased recall of new information Awareness: Impaired Awareness Impairment: Intellectual impairment Problem Solving: Impaired Problem Solving Impairment: Verbal basic, Functional basic Safety/Judgment: Impaired    Extremity Assessment (includes Sensation/Coordination)  Upper Extremity Assessment: Defer to OT evaluation LUE Deficits / Details: Active movement noted but pt difficulty following commands for formal testing.  Lower Extremity Assessment: Generalized weakness    ADLs  Overall ADL's : Needs assistance/impaired Grooming: Wash/dry face, Brushing hair Grooming Details (indicate cue type and reason): Pt able to wash face once washcloth presented to him. He used the comb as it is intended. The toothbrush he thought intially was to clean his fingernails Toilet Transfer: Moderate assistance, +2 for physical assistance  Toilet Transfer Details (indicate cue type and reason): stand turn to recliner with pt wanting to sit prematurely Toileting- Clothing Manipulation and Hygiene: Total assistance Toileting - Clothing Manipulation Details (indicate cue type and reason): to use urinal at side of bed General ADL Comments: Pt with difficulty following commands during session and would require max assist for all ADL. Attempted sit to stand from EOB x3 with total assist +2 and pt unable to acheive full standing.    Mobility  Overal bed mobility: Needs Assistance Bed Mobility: Supine to Sit Supine to sit: Max assist Sit to supine: +2 for physical assistance, Total assist General bed mobility comments: Pt required increased time  with + use of railing and elevated HOB.  Min assist for LE advancement and elevation of trunk to edge of bed.      Transfers  Overall transfer level: Needs assistance Equipment used: Rolling walker (2 wheeled) Transfers: Sit to/from Stand, [employer] Transfers Sit to Stand: Mod assist, +2 physical assistance Stand pivot transfers: Mod assist, +2 physical assistance (pt still wanting to sit too soon---before he totally gets in line with chair) General transfer comment: x2 standing trials at RW, significant weakness and coordination deficits on LUE.  Pt required cues to push from bed and extend hips, knees, trunk and head.  Pt returned to sitting on 1st trial to use the urinal.  On second attempt patient able to take steps to chair but began to sit impulsively.      Ambulation / Gait / Stairs / Wheelchair Mobility  Ambulation/Gait Ambulation/Gait assistance: Max assist, +2 physical assistance Ambulation Distance (Feet):  (steps from bed to chair.  ) Assistive device: Rolling walker (2 wheeled) Gait Pattern/deviations: Shuffle, Staggering right General Gait Details: Pt performed increased mobility advancing steps from bed to chair.  Pt remains to present with poor cognition.  Upon advancing steps from bed to chair patient began to sit slowly requiring cues to remain upright.   Gait velocity interpretation: Below normal speed for age/gender    Posture / Balance Dynamic Sitting Balance Sitting balance - Comments: Able to sit EOB without UE support. Balance Overall balance assessment: Needs assistance Sitting-balance support: Feet supported, No upper extremity supported Sitting balance-[name]: Good Sitting balance - Comments: Able to sit EOB without UE support. Standing balance support: Bilateral upper extremity supported Standing balance-[name]: Poor Standing balance comment: Pt reliant on RW with both hands in standing    Special needs/care consideration BiPAP/CPAP did no use  pta. Only used in ED this admission CPM  N/a Continuous Drip IV  N/a Dialysis  N/a Life Vest  N/a Oxygen at 2 liters nasal cannula at HS pta Special Bed  N/a Trach Size  N/a Wound Vac (area)  N/a Skin surgical incision; ecchymosis b UE                            Bowel mgmt: incontinent LBM 2/23 Bladder mgmt: incontinent Diabetic mgmt  N/a Pt is HOH per wife Decreased safety awareness therefore needs bed and chair alarms Mitts for safety   Previous Home Environment Living Arrangements: Spouse/significant other  Lives With: Spouse Available Help at Discharge: Family, Available 24 hours/day Type of Home: House Home Layout: One level Home Access: Stairs to enter Entrance Stairs-Rails: Right Entrance Stairs-Number of Steps: 3 Bathroom Shower/Tub: Health visitor: Standard Bathroom Accessibility: Yes How Accessible: Accessible via walker Home Care Services: No Additional Comments: wife can provide supervision  level  Discharge Living Setting Plans for Discharge Living Setting: Patient's home, Lives with (comment) Type of Home at Discharge: House Discharge Home Layout: One level Discharge Home Access: Stairs to enter Entrance Stairs-Rails: Right Entrance Stairs-Number of Steps: 3 Discharge Bathroom Shower/Tub: Walk-in shower Discharge Bathroom Toilet: Standard Discharge Bathroom Accessibility: Yes How Accessible: Accessible via walker Does the patient have any problems obtaining your medications?: No  Social/Family/Support Systems Patient Roles: Spouse, Parent Contact Information: [place], wife Anticipated Caregiver: wife and hired Child psychotherapistassist Anticipated Caregiver's Contact Information: see above Ability/Limitations of Caregiver: wife can provide supervision level, would hire assist if needed Caregiver Availability: 24/7 Discharge Plan Discussed with Primary Caregiver: Yes Is Caregiver In Agreement with Plan?: Yes Does Caregiver/Family have Issues with  Lodging/Transportation while Pt is in Rehab?: No  Goals/Additional Needs Patient/Family Goal for Rehab: supervision to min assist with PT, OT, and SLP Expected length of stay: [facility] 20 -25 days Special Service Needs: decreased saferty awarness; needs bed and chair alarm Additional Information: Pt with history of alcohol abuse per wife Pt/Family Agrees to Admission and willing to participate: Yes Program Orientation Provided & Reviewed with Pt/Caregiver Including Roles  & Responsibilities: Yes  Decrease burden of Care through IP rehab admission: n/a  Possible need for SNF placement upon discharge: If pt does not reach supervision level or light min assist that wife can manage at home, SNF is an option per wife. Pt does have a long term care policy for care in the home as well as SNF. Son has been discussing SNF option with his Mom after CIR per wife.  Patient Condition: This patient's medical and functional status has changed since the consult dated: 12/08/2016 in which the Rehabilitation Physician determined and documented that the patient's condition is appropriate for intensive rehabilitative care in an inpatient rehabilitation facility. See "History of Present Illness" (above) for medical update. Functional changes are: overall max assist to total s/p craniotomy. Patient's medical and functional status update has been discussed with the Rehabilitation physician and patient remains appropriate for inpatient rehabilitation. Will admit to inpatient rehab today.  Preadmission Screen Completed By:  Clois DupesBoyette, Elaina Cara Godwin, 12/19/2016 12:04 PM ______________________________________________________________________   Discussed status with Dr. Riley KillSwartz on 12/19/2016 at  1619 and received telephone approval for admission today.  Admission Coordinator:  Clois DupesBoyette, Shamonica Schadt Godwin, time [id number] Date 12/19/2016

## 2016-12-20 ENCOUNTER — Inpatient Hospital Stay (HOSPITAL_COMMUNITY): Payer: Medicare Other | Admitting: Physical Therapy

## 2016-12-20 ENCOUNTER — Inpatient Hospital Stay (HOSPITAL_COMMUNITY): Payer: Medicare Other | Admitting: Occupational Therapy

## 2016-12-20 ENCOUNTER — Inpatient Hospital Stay (HOSPITAL_COMMUNITY): Payer: Medicare Other | Admitting: Speech Pathology

## 2016-12-20 MED ORDER — POTASSIUM CHLORIDE 20 MEQ/15ML (10%) PO SOLN
10.0000 meq | Freq: Every day | ORAL | Status: DC
  Administered 2016-12-20 – 2016-12-26 (×7): 10 meq via ORAL
  Filled 2016-12-20 (×7): qty 15

## 2016-12-20 NOTE — Progress Notes (Signed)
Alex [name] is a 83 y.o. male 1931/02/23 [id number]  Subjective: No new complaints. No new problems. Slept well. Feeling OK.  Objective: Vital signs in last 24 hours: Temp:  [98.1 F (36.7 C)] 98.1 F (36.7 C) (02/23 1822) Pulse Rate:  [59-82] 64 (02/24 0448) Resp:  [18] 18 (02/24 0448) BP: (109-131)/(58-82) 124/78 (02/24 0448) SpO2:  [98 %] 98 % (02/24 0448) Weight:  [102.9 kg (226 lb 13.7 oz)] 102.9 kg (226 lb 13.7 oz) (02/24 4098) Weight change:  Last BM Date: 12/20/16  Intake/Output from previous day: 02/23 0701 - 02/24 0700 In: 240  Out: -   Physical Exam General: No apparent distress   Eating bkfst indep in bed Lungs: Normal effort. Lungs clear to auscultation, no crackles or wheezes. Cardiovascular: irregular rate and rhythm, no edema Neurological: No new neurological deficits Wounds: scalp dressing  Clean, dry, intact. No signs of infection.  Lab Results: BMET    Component Value Date/Time   NA 145 12/16/2016 1700   NA 142 11/14/2016 1601   K 3.1 (L) 12/16/2016 1700   CL 108 12/16/2016 1700   CO2 29 12/16/2016 1700   GLUCOSE 115 (H) 12/16/2016 1700   BUN 15 12/16/2016 1700   BUN 23 11/14/2016 1601   CREATININE 0.65 12/16/2016 1700   CREATININE 0.92 11/01/2015 1104   CALCIUM 8.0 (L) 12/16/2016 1700   GFRNONAA >60 12/16/2016 1700   GFRAA >60 12/16/2016 1700   CBC    Component Value Date/Time   WBC 11.5 (H) 12/16/2016 1700   RBC 3.53 (L) 12/16/2016 1700   HGB 11.6 (L) 12/16/2016 1700   HCT 36.4 (L) 12/16/2016 1700   HCT 37.6 11/14/2016 1601   PLT 190 12/16/2016 1700   PLT 181 11/14/2016 1601   MCV 103.1 (H) 12/16/2016 1700   MCV 102 (H) 11/14/2016 1601   MCH 32.9 12/16/2016 1700   MCHC 31.9 12/16/2016 1700   RDW 13.4 12/16/2016 1700   RDW 13.9 11/14/2016 1601   LYMPHSABS 1.0 12/12/2016 0503   LYMPHSABS 1.7 11/14/2016 1601   MONOABS 0.7 12/12/2016 0503   EOSABS 0.1 12/12/2016 0503   EOSABS 0.1 11/14/2016 1601   BASOSABS 0.0 12/12/2016 0503   BASOSABS 0.1 11/14/2016 1601   CBG's (last 3):  No results for input(s): GLUCAP in the last 72 hours. LFT's Lab Results  Component Value Date   ALT 38 12/12/2016   AST 43 (H) 12/12/2016   ALKPHOS 62 12/12/2016   BILITOT 1.7 (H) 12/12/2016    Studies/Results: No results found.  Medications:  I have reviewed the patient's current medications. Scheduled Medications: . ALPRAZolam  0.25 mg Oral QHS  . amiodarone  200 mg Oral BID  . aspirin EC  81 mg Oral Daily  . atorvastatin  80 mg Oral Daily  . B-complex with vitamin C  1 tablet Oral Daily  . dexamethasone  4 mg Oral Q8H  . escitalopram  10 mg Oral QHS  . furosemide  20 mg Oral Daily  . levETIRAcetam  500 mg Oral BID  . magnesium oxide  200 mg Oral Daily  . metoprolol succinate  12.5 mg Oral BID  . potassium chloride  10 mEq Oral Daily  . senna  1 tablet Oral BID   PRN Medications: bisacodyl, HYDROcodone-acetaminophen, ondansetron **OR** ondansetron (ZOFRAN) IV, polyethylene glycol, sorbitol  Assessment/Plan: Principal Problem:   Traumatic subdural hematoma (HCC) Active Problems:   3-vessel coronary artery disease   Persistent atrial fibrillation (HCC)   Chronic obstructive pulmonary disease (HCC)  OSA on CPAP   Seizures (HCC)   Length of stay, days: 1  Continue CIR therapies and support Med mgmt of chronic dz - amio, beta-blocker, diuretic low dose off full anticoag for AF following traumatic SDH and grand mal sz Cont sz prophylaxis Mood support  Jeffree Cazeau A. Felicity CoyerLeschber, MD 12/20/2016, 11:14 AM

## 2016-12-20 NOTE — Evaluation (Signed)
Occupational Therapy Assessment and Plan  Patient Details  Name: Alex [name] MRN: [id number] Date of Birth: [birth year]  OT Diagnosis: abnormal posture, ataxia, cognitive deficits, disturbance of vision, hemiplegia affecting non-dominant side, muscle weakness (generalized) and coordination disorder Rehab Potential: Rehab Potential (ACUTE ONLY): Fair ELOS: 18-22 DAYS   Today's Date: 12/20/2016 OT Individual Time: 0700-0740 OT Individual Time Calculation (min): 40 min  and Today's Date: 12/20/2016 OT Missed Time: 20 Minutes Missed Time Reason: Patient fatigue;Patient unwilling/refused to participate without medical reason    Problem List:  Patient Active Problem List   Diagnosis Date Noted  . Traumatic subdural hematoma ([place]) 12/19/2016  . Acute pulmonary edema ([place]) 12/12/2016  . Acute diastolic CHF (congestive heart failure) ([place]) 12/12/2016  . Atrial flutter ([place]) 12/12/2016  . Traumatic subdural bleed with LOC of 1 hour to 5 hours 59 minutes ([place]) 12/11/2016  . Class 1 obesity due to excess calories with body mass index (BMI) of 30.0 to 30.9 in adult   . ETOH abuse   . Chronic obstructive pulmonary disease ([place])   . Supplemental oxygen dependent   . Alcohol use   . OSA on CPAP   . Atrial fibrillation ([place])   . Coronary artery disease involving coronary bypass graft of native heart without angina pectoris   . Seizures ([place])   . Dysphagia   . Bradycardia   . Hyperglycemia   . Agitation   . Hypokalemia   . Hypernatremia   . Leukocytosis   . Macrocytic anemia   . Thrombocytopenia ([place])   . Traumatic subdural hematoma without loss of consciousness ([name])   . Change in mental status   . Subdural hematoma ([place]) 12/03/2016  . Sick sinus syndrome ([place]) 11/06/2015  . PAF (paroxysmal atrial fibrillation) ([place]) 11/14/2014  . SOB (shortness of breath) 10/31/2014  . Persistent atrial fibrillation ([place]) 10/31/2014  . Aortic stenosis, severe 07/28/2011  . 3-vessel coronary artery  disease 07/28/2011  . Hyperlipidemia, mixed 07/28/2011    Past Medical History:  Past Medical History:  Diagnosis Date  . Aortic stenosis    s/p AVR by Dr Cyndia Bent  . COPD (chronic obstructive pulmonary disease) ([place])   . History of coronary artery disease    status post stenting of the marginal circumflex in 12/2003 and again in 2009  . Hyperlipidemia   . Morbid obesity (HCC)    weight 243 pounds, BMI 31.2kg/m2, BSA 2.36 square meters  . Obstructive sleep apnea    compliant with CPAP  . Persistent atrial fibrillation [facility])    Past Surgical History:  Past Surgical History:  Procedure Laterality Date  . AORTIC VALVE REPLACEMENT (AVR)/CORONARY ARTERY BYPASS GRAFTING (CABG)   08/05/2011   LIMA to LAD, sequential saphenous vein graft to third and fourth obtuse marginal branches of the circumflex, aortic valve replacement using a 23 mm Edwards pericardial valve  . APPENDECTOMY    . CARDIOVERSION N/A 11/14/2014   Procedure: CARDIOVERSION;  Surgeon: Candee Furbish, MD;  Location: [facility] ENDOSCOPY;  Service: Cardiovascular;  Laterality: N/A;  . CARDIOVERSION N/A 11/20/2016   Procedure: CARDIOVERSION;  Surgeon: Fay Records, MD;  Location: [place];  Service: Cardiovascular;  Laterality: N/A;  . CAROTID ENDARTERECTOMY     Dr Levi Aland  . CRANIOTOMY Right 12/16/2016   Procedure: CRANIOTOMY HEMATOMA EVACUATION SUBDURAL;  Surgeon: Kevan Ny Ditty, MD;  Location: [place];  Service: Neurosurgery;  Laterality: Right;  . EP IMPLANTABLE DEVICE N/A 11/06/2015   Procedure: PPM Generator Changeout;for sick sinus syndrome with a MDT  Adapta L PPM, chronically elevated RV threshold.  . permanent pacemaker     MDT EnRhythm implanted by Dr Sherilyn Banker in [place] for complete heart block with syncope  . REPLACEMENT TOTAL KNEE BILATERAL     2006  . TEE WITHOUT CARDIOVERSION N/A 11/14/2014   Procedure: TRANSESOPHAGEAL ECHOCARDIOGRAM (TEE);  Surgeon: Candee Furbish, MD;  Location[facility] ENDOSCOPY;  Service:  Cardiovascular;  Laterality: N/A;    Assessment & Plan Clinical Impression: Patient is a 83 y.o. year old male with history of morbid obesity, alcohol use, COPD with nighttime oxygen, obstructive sleep apnea with CPAP, atrial fibrillation/AVR/CABG/PPM maintained on aspirin and Coumadin. Per chart review and wife, patient lives with spouse independent prior to admission. One level home with 3 steps to entry. Presented 12/03/2016 after a recent fall while in the yard and struck his head without loss of consciousness. Develop dry heaving gradual onset of moderate squeezing chest pain shortness of breath. CT of the head showed left parietal scalp hematoma. Acute right hemispheric subdural hematoma with maximal thickness in the right posterior parietal region measuring 15 mm. Right to left shift of 7.5 mm. Troponin negative. INR of 4.14 that was reversed. Neurosurgery Dr. Sherley Bounds advised conservative care. Hospital course confusion with delirium he had been started on Precedex. Soft restraints were added for patient safety. Reported grand mal seizure 12/04/2016 required intubation and currently maintained on Keppra. EEG showed occasional low amplitude epileptiform discharges over the right posterior temporal region 3 clinicoelectrographic seizures captured arising from the right hemisphere lasting 2-4 minutes. Patient remains off anticoagulation due to subdural hematoma. Patient initially NPOwith nasogastric tube for nutritional support however patient did pull his nasogastric tube out. A swallow study 12/11/2016 placed on a full liquid diet.. Latest cranial CT scan 12/10/2016 reviewed, showingstable right subdural hematoma with stable mass effect. Per report, no new large territory infarct or parenchymal hemorrhage identified. Physical andoccupational therapy evaluations completed with recommendations of physical medicine rehabilitation consult.Patient was admitted for a comprehensive rehabilitation  program02/14/2018. Patient was slow progressive gains. Cardiology consulted 02/16/2018with increasing shortness of breath, tachycardia and wheezing. Chest x-ray consistent with pulmonary edema with small-to-moderate posterior pleural effusion. He did receive intravenous Lasix. His amiodarone was adjusted andLopressor was titrateddue to tachycardia. Patient continued on his full liquid diet. 12/15/2016 patient more lethargic and difficult to arouse. Follow-up CT of the head showed interval increase in size of large broad-based right sided subdural hematoma now measuring 21.5 mm maximal thickness versus 9 mm. Increased mass effect upon the right lateral ventricle with midline shift to the left by 14.4 mm versus prior 7.4. Neurosurgery was consulted and patient was discharged to acute care services 12/15/2016 for ongoing monitoring in guarded condition. Underwent right frontotemporal parietal craniotomy for evacuation of subdural hematoma 12/16/2016 per Dr. Cyndy Freeze. Maintained on Keppra for seizure prophylaxis as well as Decadron protocol.His low-dose aspirin was resumed for history of atrial fibrillation but no Coumadin at this time due to SDH.Mild hypokalemia with supplement added. Physical and occupational therapy evaluations resumed. Patient has been readmitted back to inpatient rehabilitation services for ongoing comprehensive therapies .  Patient transferred to CIR on 12/19/2016 .    Patient currently requires max- total A with basic self-care skills secondary to muscle weakness, decreased cardiorespiratoy endurance, ataxia and decreased coordination, decreased initiation, decreased attention, decreased awareness, decreased problem solving, decreased safety awareness, decreased memory and delayed processing and decreased sitting balance, decreased standing balance and decreased balance strategies.  Prior to hospitalization, patient could complete ADLs with modified independent .  Patient will benefit from  skilled intervention to increase independence with basic self-care skills prior to discharge home with care partner.  Anticipate patient will require 24 hour supervision and follow up home health.  OT - End of Session Activity Tolerance: Decreased this session Endurance Deficit: Yes Endurance Deficit Description: multiple rest breaks OT Assessment Rehab Potential (ACUTE ONLY): Fair Barriers to Discharge: Decreased caregiver support OT Patient demonstrates impairments in the following area(s): Balance;Behavior;Cognition;Endurance;Motor;Perception;Pain;Safety OT Basic ADL's Functional Problem(s): Grooming;Bathing;Dressing;Toileting OT Transfers Functional Problem(s): Toilet;Tub/Shower OT Additional Impairment(s): None OT Plan OT Intensity: Minimum of 1-2 x/day, 45 to 90 minutes OT Frequency: 5 out of 7 days OT Duration/Estimated Length of Stay: 18-22 DAYS OT Self Feeding Anticipated Outcome(s): n/a OT Basic Self-Care Anticipated Outcome(s): supervision OT Toileting Anticipated Outcome(s): supervision OT Bathroom Transfers Anticipated Outcome(s): supervision OT Recommendation Recommendations for Other Services: Neuropsych consult Patient destination: Home Follow Up Recommendations: Home health OT Equipment Recommended: To be determined   Skilled Therapeutic Intervention Upon entering the room, pt supine in bed sleeping. Pt very lethargic this morning. Pt waking up for moments and then closing eyes and going back to sleep. Pt reclined OOB tasks, self care, eating, therapeutic exercises, and sitting EOB this session. Wash cloth placed in hand and pt placed on face and promptly returned to sleeping. LB clothing donned while supine with total A for dressing. Pt remained in bed as he continued to refuse participation. RN notified. Call bell and all needed items within reach upon exiting the room.   OT Evaluation Precautions/Restrictions  Precautions Precautions: Fall Restrictions Weight  Bearing Restrictions: No Vital Signs Therapy Vitals Pulse Rate: 64 Resp: 18 BP: 124/78 Patient Position (if appropriate): Sitting Oxygen Therapy SpO2: 98 % O2 Device: Not Delivered Pain Pain Assessment Pain Assessment: No/denies pain Home Living/Prior Functioning Home Living Family/patient expects to be discharged to:: Private residence Living Arrangements: Spouse/significant other Available Help at Discharge: Family, Available 24 hours/day Type of Home: House Home Access: Stairs to enter Technical brewer of Steps: 3 Entrance Stairs-Rails: Right Home Layout: One level Bathroom Shower/Tub: Multimedia programmer: Standard Bathroom Accessibility: Yes Additional Comments: wife can provide supervision level  Lives With: Spouse Prior Function Level of Independence: Independent with basic ADLs, Independent with gait, Independent with transfers  Able to Take Stairs?: Yes Driving: Yes Vocation: Full time employment Comments: drives, active on property at home Vision/Perception  Vision- Assessment Additional Comments: Pt lethargic on initial evaluation. Vision needed to be tested further when better able to participate  Cognition Overall Cognitive Status: Impaired/Different from baseline Arousal/Alertness: Lethargic Orientation Level: Person Year: 2018 Month: March Day of Week: Incorrect Memory: Impaired Memory Impairment: Decreased recall of new information Immediate Memory Recall: Sock;Blue Memory Recall:  (0/3) Attention: Sustained Sustained Attention: Impaired Sustained Attention Impairment: Verbal basic;Functional basic Awareness: Impaired Awareness Impairment: Intellectual impairment Problem Solving: Impaired Problem Solving Impairment: Verbal basic;Functional basic Executive Function:  (all areas impaired by lower level deficits) Safety/Judgment: Impaired Sensation Sensation Light Touch: Impaired Detail Light Touch Impaired Details:  (BLEs  absent above knee. ?accuracy) Coordination Gross Motor Movements are Fluid and Coordinated: No Fine Motor Movements are Fluid and Coordinated: No Coordination and Movement Description: decr coordination left>right Motor  Motor Motor: Hemiplegia;Abnormal postural alignment and control;Motor apraxia;Motor impersistence Mobility  Bed Mobility Bed Mobility: Supine to Sit Supine to Sit: HOB elevated;3: Mod assist;With rails Transfers Sit to Stand: 3: Mod assist Sit to Stand Details: Tactile cues for sequencing;Tactile cues for initiation;Tactile cues for posture;Verbal cues for safe use of DME/AE;Manual  facilitation for placement;Manual facilitation for weight shifting Stand to Sit: 4: Min assist  Trunk/Postural Assessment  Cervical Assessment Cervical Assessment:  (forward flexed at rest, can correct with cues) Thoracic Assessment Thoracic Assessment: Exceptions to [facility] (rounded shoulders) Lumbar Assessment Lumbar Assessment: Within Functional Limits Postural Control Postural Control: Deficits on evaluation Trunk Control: min without UE support Righting Reactions: delayed Protective Responses: delayed  Balance Balance Balance Assessed: Yes Dynamic Sitting Balance Dynamic Sitting - Balance Support: No upper extremity supported;During functional activity;Feet supported Dynamic Sitting - Level of Assistance: 4: Min assist Sitting balance - Comments: in w/c reaching Static Standing Balance Static Standing - Balance Support: Bilateral upper extremity supported Static Standing - Level of Assistance: 4: Min assist Extremity/Trunk Assessment RUE Assessment RUE Assessment: Within Functional Limits LUE Assessment LUE Assessment: Exceptions to WFL LUE AROM (degrees) Overall AROM Left Upper Extremity: Other (comment) (0-90) LUE Strength LUE Overall Strength Comments: 2+/5   See Function Navigator for Current Functional Status.   Refer to Care Plan for Long Term  Goals  Recommendations for other services: Neuropsych and Therapeutic Recreation  Pet therapy   Discharge Criteria: Patient will be discharged from OT if patient refuses treatment 3 consecutive times without medical reason, if treatment goals not met, if there is a change in medical status, if patient makes no progress towards goals or if patient is discharged from hospital.  The above assessment, treatment plan, treatment alternatives and goals were discussed and mutually agreed upon: by patient  Gypsy Decant 12/20/2016, 7:41 AM

## 2016-12-20 NOTE — Progress Notes (Signed)
12/20/16 1418 nursing Wife and PT asked RN about the  Small fluid build up on patient's rt side of face  Under his rt eye .RN assessed and CN assessed it looks like its a drainage from surgical site and reasured wife that it will will reabsorb itself in time. Continued to monitor.

## 2016-12-20 NOTE — Evaluation (Addendum)
Speech Language Pathology Assessment and Plan  Patient Details  Name: Alex [name] MRN: [id number] Date of Birth: Aug 17, 1931  SLP Diagnosis: Cognitive Impairments;Dysphagia  Rehab Potential: Good ELOS:   18 to 22 days   Today's Date: 12/20/2016 SLP Individual Time: 0800-0900 SLP Individual Time Calculation (min): 60 min   Problem List:  Patient Active Problem List   Diagnosis Date Noted  . Traumatic subdural hematoma ([place]) 12/19/2016  . Acute pulmonary edema ([place]) 12/12/2016  . Acute diastolic CHF (congestive heart failure) ([place]) 12/12/2016  . Atrial flutter ([place]) 12/12/2016  . Traumatic subdural bleed with LOC of 1 hour to 5 hours 59 minutes ([place]) 12/11/2016  . Class 1 obesity due to excess calories with body mass index (BMI) of 30.0 to 30.9 in adult   . ETOH abuse   . Chronic obstructive pulmonary disease ([place])   . Supplemental oxygen dependent   . Alcohol use   . OSA on CPAP   . Atrial fibrillation ([place])   . Coronary artery disease involving coronary bypass graft of native heart without angina pectoris   . Seizures ([place])   . Dysphagia   . Bradycardia   . Hyperglycemia   . Agitation   . Hypokalemia   . Hypernatremia   . Leukocytosis   . Macrocytic anemia   . Thrombocytopenia ([place])   . Traumatic subdural hematoma without loss of consciousness ([place])   . Change in mental status   . Subdural hematoma ([place]) 12/03/2016  . Sick sinus syndrome ([place]) 11/06/2015  . PAF (paroxysmal atrial fibrillation) ([place]) 11/14/2014  . SOB (shortness of breath) 10/31/2014  . Persistent atrial fibrillation ([place]) 10/31/2014  . Aortic stenosis, severe 07/28/2011  . 3-vessel coronary artery disease 07/28/2011  . Hyperlipidemia, mixed 07/28/2011   Past Medical History:  Past Medical History:  Diagnosis Date  . Aortic stenosis    s/p AVR by Dr Cyndia Bent  . COPD (chronic obstructive pulmonary disease) ([place])   . History of coronary artery disease    status post stenting of the marginal  circumflex in 12/2003 and again in 2009  . Hyperlipidemia   . Morbid obesity ([facility])    weight 243 pounds, BMI 31.2kg/m2, BSA 2.36 square meters  . Obstructive sleep apnea    compliant with CPAP  . Persistent atrial fibrillation [facility])    Past Surgical History:  Past Surgical History:  Procedure Laterality Date  . AORTIC VALVE REPLACEMENT (AVR)/CORONARY ARTERY BYPASS GRAFTING (CABG)   08/05/2011   LIMA to LAD, sequential saphenous vein graft to third and fourth obtuse marginal branches of the circumflex, aortic valve replacement using a 23 mm Edwards pericardial valve  . APPENDECTOMY    . CARDIOVERSION N/A 11/14/2014   Procedure: CARDIOVERSION;  Surgeon: Candee Furbish, MD;  Location: [facility] ENDOSCOPY;  Service: Cardiovascular;  Laterality: N/A;  . CARDIOVERSION N/A 11/20/2016   Procedure: CARDIOVERSION;  Surgeon: Fay Records, MD;  Location: [place];  Service: Cardiovascular;  Laterality: N/A;  . CAROTID ENDARTERECTOMY     Dr Levi Aland  . CRANIOTOMY Right 12/16/2016   Procedure: CRANIOTOMY HEMATOMA EVACUATION SUBDURAL;  Surgeon: Kevan Ny Ditty, MD;  Location: [place];  Service: Neurosurgery;  Laterality: Right;  . EP IMPLANTABLE DEVICE N/A 11/06/2015   Procedure: PPM Generator Changeout;for sick sinus syndrome with a MDT Adapta L PPM, chronically elevated RV threshold.  . permanent pacemaker     MDT EnRhythm implanted by Dr Sherilyn Banker in [place] for complete heart block with syncope  . REPLACEMENT TOTAL KNEE BILATERAL  2006  . TEE WITHOUT CARDIOVERSION N/A 11/14/2014   Procedure: TRANSESOPHAGEAL ECHOCARDIOGRAM (TEE);  Surgeon: Candee Furbish, MD;  Location: [facility] ENDOSCOPY;  Service: Cardiovascular;  Laterality: N/A;    Assessment / Plan / Recommendation Clinical Impression Alex [name] a 83 y.o.right handed malewith history of morbid obesity, alcohol use, COPD with nighttime oxygen, obstructive sleep apnea with CPAP, atrial fibrillation/AVR/CABG/PPM maintained on aspirin and  Coumadin. Per chart review and wife, patient lives with spouse independent prior to admission. Presented 12/03/2016 after a recent fall while in the yard and struck his head without loss of consciousness. Develop dry heaving gradual onset of moderate squeezing chest pain shortness of breath. CT of the head showed left parietal scalp hematoma. Acute right hemispheric subdural hematoma with maximal thickness in the right posterior parietal region measuring 15 mm. Right to left shift of 7.5 mm. Hospital course confusion with delirium he had been started on Precedex. Reported grand mal seizure 12/04/2016 required intubation and currently maintained on Keppra. EEG showed occasional low amplitude epileptiform discharges over the right posterior temporal region 3 clinicoelectrographic seizures captured arising from the right hemisphere lasting 2-4 minutes. Patient initially NPOwith nasogastric tube for nutritional support however patient did pull his nasogastric tube out. A swallow study 12/11/2016 placed on a full liquid diet.. Latest cranial CT scan 12/10/2016 reviewed, showingstable right subdural hematoma with stable mass effect. Per report, no new large territory infarct or parenchymal hemorrhage identified. Patient was admitted for a comprehensive rehabilitation program02/14/2018. 12/15/2016 patient more lethargic and difficult to arouse. Follow-up CT of the head showed interval increase in size of large broad-based right sided subdural hematoma now measuring 21.5 mm maximal thickness versus 9 mm. Increased mass effect upon the right lateral ventricle with midline shift to the left by 14.4 mm versus prior 7.4. Neurosurgery was consulted and patient was discharged to acute care services 12/15/2016 for ongoing monitoring in guarded condition. Underwent right frontotemporal parietal craniotomy for evacuation of subdural hematoma 12/16/2016. Patient was readmitted back to inpatient rehabilitation services for ongoing  comprehensive therapies on 12/19/16.   Cognitive linguistic and bedside swallow evaluations completed on 12/20/16. Pt demonstrates significant cognitive deficts c/b decreased focused attention, emergent awareness, orientation, basic problem solving which impact all higher level cognitive function. Pt's speech intelligibility appears functional is intelligible at the simple conversation level. Pt consumed pureed items this session and recommend pt remaining on dysphagia 1 diet until further trials of dysphagia 2 can be assessed. Pt requires skilled ST to address above mentioned deficits, increase functional independence and reduce caregiver burden prior to discharge. I anticipate that pt will require 24 hours supervision and follow up ST services at time of discharge.     Skilled Therapeutic Interventions          Skilled treatment session focused on completing above mentioned evaluations. Initially, pt required Max A multimodal cues for arousal but then able to stay awake for remainder of session. Pt presents with significant confusion and unable to recall correct information once provided. Pt consumed dysphagia 1 breakfast tray with thin liquids without overt s/s of aspiration.    SLP Assessment  Patient will need skilled Speech Lanaguage Pathology Services during CIR admission    Recommendations  SLP Diet Recommendations: Dysphagia 1 (Puree);Thin Liquid Administration via: Cup;Straw Medication Administration: Crushed with puree Supervision: Full supervision/cueing for compensatory strategies;Staff to assist with self feeding;Patient able to self feed Compensations: Minimize environmental distractions;Slow rate;Small sips/bites;Follow solids with liquid Postural Changes and/or Swallow Maneuvers: Seated upright 90 degrees Oral Care  Recommendations: Oral care BID Recommendations for Other Services: Neuropsych consult Patient destination: Home Follow up Recommendations: Home Health SLP;24 hour  supervision/assistance Equipment Recommended: To be determined    SLP Frequency 3 to 5 out of 7 days   SLP Duration  SLP Intensity  SLP Treatment/Interventions    Minumum of 1-2 x/day, 30 to 90 minutes  Cognitive remediation/compensation;Cueing hierarchy;Dysphagia/aspiration precaution training;Environmental controls;Functional tasks;Internal/external aids;Patient/family education;Speech/Language facilitation;Therapeutic Activities    Pain Pain Assessment Pain Assessment: No/denies pain  Prior Functioning Cognitive/Linguistic Baseline: Within functional limits Type of Home: House  Lives With: Spouse Available Help at Discharge: Family;Available 24 hours/day Vocation: Full time employment  Function:  Eating Eating   Modified Consistency Diet: Yes Eating Assist Level: Set up assist for;Supervision or verbal cues;Helper checks for pocketed food   Eating Set Up Assist For: Opening containers       Cognition Comprehension Comprehension assist level: Understands basic 25 - 49% of the time/ requires cueing 50 - 75% of the time  Expression   Expression assist level: Expresses basic 25 - 49% of the time/requires cueing 50 - 75% of the time. Uses single words/gestures.  Social Interaction Social Interaction assist level: Interacts appropriately less than 25% of the time. May be withdrawn or combative.  Problem Solving Problem solving assist level: Solves basic less than 25% of the time - needs direction nearly all the time or does not effectively solve problems and may need a restraint for safety  Memory Memory assist level: Recognizes or recalls less than 25% of the time/requires cueing greater than 75% of the time   Short Term Goals: Week 1: SLP Short Term Goal 1 (Week 1): Patient will consume current diet with minimal overt s/s of aspiration with Min A verbal cues.  SLP Short Term Goal 2 (Week 1): Pt will consumed trials of dysphagia 2 with minimal overt s/s of aspiration and  Min A verbal cues for complete oral clearing.  SLP Short Term Goal 3 (Week 1): Pt will sustained attention for ~5 minutes with Mod A verbal cues for redirection to tasks. SLP Short Term Goal 4 (Week 1): Pt will answer orientation questions with Max A multimodal cues.  SLP Short Term Goal 5 (Week 1): Pt will follow 1 step functional basic directions to complete ADL task with Min A cues.  SLP Short Term Goal 6 (Week 1): Pt will demonstrate functional problem solving of basic, famaliar tasks with Mod A verbal cues.   Refer to Care Plan for Long Term Goals  Recommendations for other services: Neuropsych  Discharge Criteria: Patient will be discharged from SLP if patient refuses treatment 3 consecutive times without medical reason, if treatment goals not met, if there is a change in medical status, if patient makes no progress towards goals or if patient is discharged from hospital.  The above assessment, treatment plan, treatment alternatives and goals were discussed and mutually agreed upon: by patient  Kinslee Dalpe B. Rutherford Nail, M.S., CCC-SLP Speech-Language Pathologist  Alex [name] 12/20/2016, 11:24 AM

## 2016-12-20 NOTE — Evaluation (Signed)
Physical Therapy Assessment and Plan  Patient Details  Name: Alex [name] MRN: [id number] Date of Birth: 04/07/1931  PT Diagnosis: Abnormal posture, Abnormality of gait, Ataxia, Coordination disorder, Hemiparesis non-dominant, Impaired cognition, Impaired sensation and Muscle weakness Rehab Potential: Good ELOS: 18-22 days   Today's Date: 12/20/2016 PT Individual Time: 8469-6295 PT Individual Time Calculation (min): 83 min    Problem List:  Patient Active Problem List   Diagnosis Date Noted  . Traumatic subdural hematoma ([place]) 12/19/2016  . Acute pulmonary edema ([place]) 12/12/2016  . Acute diastolic CHF (congestive heart failure) ([place]) 12/12/2016  . Atrial flutter ([place]) 12/12/2016  . Traumatic subdural bleed with LOC of 1 hour to 5 hours 59 minutes ([place]) 12/11/2016  . Class 1 obesity due to excess calories with body mass index (BMI) of 30.0 to 30.9 in adult   . ETOH abuse   . Chronic obstructive pulmonary disease ([place])   . Supplemental oxygen dependent   . Alcohol use   . OSA on CPAP   . Atrial fibrillation ([place])   . Coronary artery disease involving coronary bypass graft of native heart without angina pectoris   . Seizures ([place])   . Dysphagia   . Bradycardia   . Hyperglycemia   . Agitation   . Hypokalemia   . Hypernatremia   . Leukocytosis   . Macrocytic anemia   . Thrombocytopenia ([place])   . Traumatic subdural hematoma without loss of consciousness ([place])   . Change in mental status   . Subdural hematoma ([place]) 12/03/2016  . Sick sinus syndrome ([place]) 11/06/2015  . PAF (paroxysmal atrial fibrillation) ([place]) 11/14/2014  . SOB (shortness of breath) 10/31/2014  . Persistent atrial fibrillation ([place]) 10/31/2014  . Aortic stenosis, severe 07/28/2011  . 3-vessel coronary artery disease 07/28/2011  . Hyperlipidemia, mixed 07/28/2011    Past Medical History:  Past Medical History:  Diagnosis Date  . Aortic stenosis    s/p AVR by Dr Cyndia Bent  . COPD (chronic obstructive  pulmonary disease) ([place])   . History of coronary artery disease    status post stenting of the marginal circumflex in 12/2003 and again in 2009  . Hyperlipidemia   . Morbid obesity (HCC)    weight 243 pounds, BMI 31.2kg/m2, BSA 2.36 square meters  . Obstructive sleep apnea    compliant with CPAP  . Persistent atrial fibrillation [facility])    Past Surgical History:  Past Surgical History:  Procedure Laterality Date  . AORTIC VALVE REPLACEMENT (AVR)/CORONARY ARTERY BYPASS GRAFTING (CABG)   08/05/2011   LIMA to LAD, sequential saphenous vein graft to third and fourth obtuse marginal branches of the circumflex, aortic valve replacement using a 23 mm Edwards pericardial valve  . APPENDECTOMY    . CARDIOVERSION N/A 11/14/2014   Procedure: CARDIOVERSION;  Surgeon: Candee Furbish, MD;  Location: [facility] ENDOSCOPY;  Service: Cardiovascular;  Laterality: N/A;  . CARDIOVERSION N/A 11/20/2016   Procedure: CARDIOVERSION;  Surgeon: Fay Records, MD;  Location: [place];  Service: Cardiovascular;  Laterality: N/A;  . CAROTID ENDARTERECTOMY     Dr Levi Aland  . CRANIOTOMY Right 12/16/2016   Procedure: CRANIOTOMY HEMATOMA EVACUATION SUBDURAL;  Surgeon: Kevan Ny Ditty, MD;  Location: [place];  Service: Neurosurgery;  Laterality: Right;  . EP IMPLANTABLE DEVICE N/A 11/06/2015   Procedure: PPM Generator Changeout;for sick sinus syndrome with a MDT Adapta L PPM, chronically elevated RV threshold.  . permanent pacemaker     MDT EnRhythm implanted by Dr Sherilyn Banker in [place] for complete  heart block with syncope  . REPLACEMENT TOTAL KNEE BILATERAL     2006  . TEE WITHOUT CARDIOVERSION N/A 11/14/2014   Procedure: TRANSESOPHAGEAL ECHOCARDIOGRAM (TEE);  Surgeon: Candee Furbish, MD;  Location: [facility] ENDOSCOPY;  Service: Cardiovascular;  Laterality: N/A;    Assessment & Plan Clinical Impression: Alex [name] is a 83 y.o. right handed male with history of morbid obesity, alcohol use, COPD with nighttime oxygen,  obstructive sleep apnea with CPAP, atrial fibrillation/AVR/CABG/PPM maintained on aspirin and Coumadin.  Presented 12/03/2016 after a recent fall and struck his head without loss of consciousness. Develop dry heaving gradual onset of moderate squeezing chest pain shortness of breath. CT of the head showed left parietal scalp hematoma. Acute right hemispheric subdural hematoma with maximal thickness in the right posterior parietal region measuring 15 mm. Right to left shift of 7.5 mm. Troponin negative. INR of 4.14 that was reversed. Neurosurgery Dr. Sherley Bounds advised conservative care. Hospital course confusion with delirium he had been started on Precedex. Soft restraints were added for patient safety. Reported grand mal seizure 12/04/2016 required intubation and currently maintained on Keppra. EEG showed occasional low amplitude epileptiform discharges over the right posterior temporal region 3 clinicoelectrographic seizures captured arising from the right hemisphere lasting 2-4 minutes. Patient remains off anticoagulation due to subdural hematoma. Patient initially NPO with nasogastric tube for nutritional support however patient did pull his nasogastric tube out. A swallow study 12/11/2016 placed on a full liquid diet.. Latest cranial CT scan 12/10/2016 reviewed, showing stable right subdural hematoma with stable mass effect. Per report, no new large territory infarct or parenchymal hemorrhage identified. Physical and occupational therapy evaluations completed with recommendations of physical medicine rehabilitation consult.Patient was admitted for a comprehensive rehabilitation program 12/10/2016.    Patient was slow progressive gains. Cardiology consulted 12/12/2016 with increasing shortness of breath, tachycardia and wheezing. Chest x-ray consistent with pulmonary edema with small-to-moderate posterior pleural effusion. He did receive intravenous Lasix. His amiodarone was adjusted and Lopressor was  titrated due to tachycardia. Patient continued on his full liquid diet. 12/15/2016 patient more lethargic and difficult to arouse. Follow-up CT of the head showed interval increase in size of large broad-based right sided subdural hematoma now measuring 21.5 mm maximal thickness versus 9 mm. Increased mass effect upon the right lateral ventricle with midline shift to the left by 14.4 mm versus prior 7.4. Neurosurgery was consulted and patient was discharged to acute care services 12/15/2016 for ongoing monitoring in guarded condition.    Underwent right frontotemporal parietal craniotomy for evacuation of subdural hematoma 12/16/2016 per Dr. Cyndy Freeze. Maintained on Keppra for seizure prophylaxis as well as Decadron protocol. His low-dose aspirin was resumed for history of atrial fibrillation but no Coumadin at this time due to SDH. Mild hypokalemia with supplement added. Physical and occupational therapy evaluations resumed.     Patient transferred to CIR on 12/19/2016 .   Patient currently requires mod with mobility secondary to muscle weakness, decreased cardiorespiratoy endurance, impaired timing and sequencing, unbalanced muscle activation, motor apraxia, ataxia, decreased coordination and decreased motor planning, decreased attention to left, decreased initiation, decreased attention, decreased awareness, decreased problem solving, decreased safety awareness, decreased memory and delayed processing and decreased sitting balance, decreased standing balance, decreased postural control and decreased balance strategies.  Prior to hospitalization, patient was independent  with mobility and lived with Spouse in a House home.  Home access is 3Stairs to enter.  Patient will benefit from skilled PT intervention to maximize safe functional mobility, minimize fall risk  and decrease caregiver burden for planned discharge home with 24 hour supervision.  Anticipate patient will benefit from follow up [place] at  discharge.  PT - End of Session Activity Tolerance: Tolerates 30+ min activity with multiple rests Endurance Deficit: Yes PT Assessment Rehab Potential (ACUTE/IP ONLY): Good Barriers to Discharge: Decreased caregiver support;Inaccessible home environment PT Patient demonstrates impairments in the following area(s): Balance;Endurance;Motor;Perception;Safety;Sensory PT Transfers Functional Problem(s): Bed Mobility;Bed to Chair;Car;Furniture PT Locomotion Functional Problem(s): Ambulation;Wheelchair Mobility;Stairs PT Plan PT Intensity: Minimum of 1-2 x/day ,45 to 90 minutes PT Frequency: 5 out of 7 days PT Duration Estimated Length of Stay: 18-22 days PT Treatment/Interventions: Ambulation/gait training;Cognitive remediation/compensation;Discharge planning;DME/adaptive equipment instruction;Functional mobility training;Pain management;Splinting/orthotics;Therapeutic Activities;UE/LE Strength taining/ROM;Balance/vestibular training;Community reintegration;Functional electrical stimulation;Neuromuscular re-education;Patient/family education;Stair training;Therapeutic Exercise;UE/LE Coordination activities;Wheelchair propulsion/positioning;Psychosocial support;Visual/perceptual remediation/compensation PT Transfers Anticipated Outcome(s): supervision PT Locomotion Anticipated Outcome(s): supervision ambulatory PT Recommendation Recommendations for Other Services: Neuropsych consult;Therapeutic Recreation consult (cognitive) Therapeutic Recreation Interventions: Pet therapy;Outing/community reintergration Follow Up Recommendations: Home health PT;24 hour supervision/assistance Patient destination: Home Equipment Recommended: To be determined  Skilled Therapeutic Intervention No c/o pain.  Session focus on initial PT assessment, pt/family education, balance, transfers, gait, and w/c mobility.    PT provided education to pt and wife ([place]) regarding role of PT, goals of therapy, ELOS, and  safety plan.  PT instructed pt in bed mobility with mod assist to elevate trunk.  Squat/pivot transfers throughout session with mod assist to facilitate weight shift and pivot.  Sit<>stand throughout session and stand/pivot with RW at end of session with min assist and verbal cues for hand placement.  Gait training x20' with RW and mod assist with verbal cues for upright posture and improved foot clearance.  Stair negotiation x8 steps with 2 rails and mod assist with mod multimodal cues for sequencing. Pt completed car transfer with mod assist overall.  W/C propulsion for improved cardiovascular endurance and mobility with BUEs x150' with supervision.  Pt seated in recliner at end of session and positioned with call bell in reach and needs met.   PT Evaluation Precautions/Restrictions Precautions Precautions: Fall Precaution Comments: ataxic, poor safety awareness Restrictions Weight Bearing Restrictions: No Pain Pain Assessment Pain Assessment: No/denies pain Home Living/Prior Functioning Home Living Available Help at Discharge: Family;Available 24 hours/day Type of Home: House Home Access: Stairs to enter [employer] of Steps: 3 Entrance Stairs-Rails: Right Home Layout: One level Additional Comments: wife can provide supervision level  Lives With: Spouse Prior Function Level of Independence: Independent with gait;Independent with transfers  Able to Take Stairs?: Yes Driving: Yes Vocation: Full time employment 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 ?accuracy) Coordination Gross Motor Movements are Fluid and Coordinated: No Fine Motor Movements are Fluid and Coordinated: No Coordination and Movement Description: decr coordination left>right Motor  Motor Motor: Hemiplegia;Abnormal postural alignment and control;Motor apraxia;Motor impersistence  Mobility Bed Mobility Bed Mobility: Supine to Sit Supine to Sit: HOB elevated;3: Mod assist;With rails Transfers Transfers: Yes Sit to Stand: 3: Mod assist Sit to Stand Details: Tactile cues for sequencing;Tactile cues for initiation;Tactile cues for posture;Verbal cues for safe use  of DME/AE;Manual facilitation for placement;Manual facilitation for weight shifting Stand to Sit: 4: Min assist Squat Pivot Transfers: 3: Mod assist Locomotion  Ambulation Ambulation: Yes Ambulation/Gait Assistance: 3: Mod assist Ambulation Distance (Feet): 20 Feet Assistive device: Rolling walker Ambulation/Gait Assistance Details: Tactile cues for posture;Verbal cues for technique;Verbal cues for gait pattern;Verbal cues for safe use of DME/AE;Manual facilitation for weight shifting Gait Gait: Yes Gait Pattern: Impaired Gait Pattern: Ataxic;Shuffle;Trunk flexed;Decreased stride length;Poor foot clearance - right;Poor foot clearance - left Stairs / Additional Locomotion Stairs: Yes Stairs Assistance: 3: Mod assist Stair Management Technique: Two rails Number of Stairs: 8 Wheelchair Mobility Wheelchair Mobility: Yes Wheelchair Assistance: 5: Investment banker, operational Details: Verbal cues for Marketing executive: Both upper extremities Wheelchair Parts Management: Needs assistance Distance: 150  Trunk/Postural Assessment  Cervical Assessment Cervical Assessment:  (forward flexed at rest, can  correct with cues) Thoracic Assessment Thoracic Assessment: Exceptions to [facility] (rounded shoulders) Lumbar Assessment Lumbar Assessment: Within Functional Limits Postural Control Postural Control: Deficits on evaluation Trunk Control: min without UE support Righting Reactions: delayed Protective Responses: delayed  Balance Balance Balance Assessed: Yes Dynamic Sitting Balance Dynamic Sitting - Balance Support: No upper extremity supported;During functional activity;Feet supported Dynamic Sitting - Level of Assistance: 4: Min assist Sitting balance - Comments: in w/c reaching Static Standing Balance Static Standing - Balance Support: Bilateral upper extremity supported Static Standing - Level of Assistance: 4: Min assist Extremity Assessment      RLE Assessment RLE Assessment: Exceptions to [facility] RLE Strength RLE Overall Strength Comments: able to move against gravity, unsure of accuracy of formal testing 2/2 cognitive deficits LLE Assessment LLE Assessment: Exceptions to [facility] LLE Strength LLE Overall Strength Comments: able to move against gravity, unsure of accuracy of formal testing 2/2 cognitive deficits   See Function Navigator for Current Functional Status.   Refer to Care Plan for Long Term Goals  Recommendations for other services: Neuropsych and Therapeutic Recreation  Pet therapy and Outing/community reintegration  Discharge Criteria: Patient will be discharged from PT if patient refuses treatment 3 consecutive times without medical reason, if treatment goals not met, if there is a change in medical status, if patient makes no progress towards goals or if patient is discharged from hospital.  The above assessment, treatment plan, treatment alternatives and goals were discussed and mutually agreed upon: by patient and by family  Earnest Conroy Penven-Crew 12/20/2016, 3:24 PM

## 2016-12-21 ENCOUNTER — Inpatient Hospital Stay (HOSPITAL_COMMUNITY): Payer: Medicare Other | Admitting: Physical Therapy

## 2016-12-21 MED ORDER — LIDOCAINE HCL 2 % EX GEL
1.0000 "application " | Freq: Once | CUTANEOUS | Status: AC
  Administered 2016-12-21: 1 via URETHRAL
  Filled 2016-12-21: qty 5

## 2016-12-21 NOTE — Progress Notes (Signed)
Physical Therapy Session Note  Patient Details  Name: Alex DuhamelJohn R Lussier MRN: [id number] Date of Birth: 01-15-1931  Today's Date: 12/21/2016 PT Individual Time: 1345-1415 PT Individual Time Calculation (min): 30 min   Short Term Goals: Week 1:  PT Short Term Goal 1 (Week 1): pt will transfer consistently with min assist and LRAD PT Short Term Goal 2 (Week 1): Pt will ambulate 5650' with LRAD and min assist PT Short Term Goal 3 (Week 1): pt will demonstrate intellectual awareness with min cues in 25% of opportunities  Skilled Therapeutic Interventions/Progress Updates:  Pt was supine in bed upon arrival. Pt was assisted with threading pants and was supervision with pulling up pants in supine before transferring to EOB. Pt required steady assistance to sit at EOB with HOB elevated and with use of bedrails to pull up pants. Pt was mod A with sit-to-stand and with ambulating 20 ft with RW with verbal cues for safe use of RW to use commode. Pt required mod A to lower onto toilet and max assist to stand up with tactile and verbal cues for handplacement and sequence. Pt was dependent with perineal hygiene and donning new briefs and pants in standing with min A for stability. Pt ambulated 13 ft with RW mod A to return to W/C. Pt required min A to perform hand hygiene at sink due to over shooting of sink handles and verbal cues to attend to left hand. Pt was left sitting up in W/C with all needs within reach and quick release belt on. Nursing was present before leaving. Pt was not complaining of any pain during treatment     Therapy Documentation Precautions:  Precautions Precautions: Fall Precaution Comments: ataxic, poor safety awareness Restrictions Weight Bearing Restrictions: No   See Function Navigator for Current Functional Status.   Therapy/Group: Individual Therapy  Rudie MeyerJeffrey Vonzell Lindblad 12/21/2016, 3:45 PM

## 2016-12-21 NOTE — Progress Notes (Signed)
Alex DuhamelJohn R [name] is a 83 y.o. male 1931/06/14 [id number]  Subjective: No new complaints. No new problems. Slept well. Feeling OK. Very pleasant, mildly confused: "I thought I was home! It looks different every day!"  Objective: Vital signs in last 24 hours: Temp:  [98.2 F (36.8 C)-98.6 F (37 C)] 98.6 F (37 C) (02/25 0500) Pulse Rate:  [59-99] 59 (02/25 0500) Resp:  [16-17] 16 (02/25 0500) BP: (97-135)/(53-86) 118/53 (02/25 0500) SpO2:  [98 %-99 %] 99 % (02/25 0500) Weight change:  Last BM Date: 12/20/16 (per report)  Intake/Output from previous day: 02/24 0701 - 02/25 0700 In: 600 [P.O.:600] Out: 1950 [Urine:1950]  Physical Exam General: No apparent distress   Happily eating breakfast Lungs: Normal effort. Lungs clear to auscultation, no crackles or wheezes. Cardiovascular: irregular rate and rhythm, no edema Neurological: No new neurological deficits   Lab Results: BMET    Component Value Date/Time   NA 145 12/16/2016 1700   NA 142 11/14/2016 1601   K 3.1 (L) 12/16/2016 1700   CL 108 12/16/2016 1700   CO2 29 12/16/2016 1700   GLUCOSE 115 (H) 12/16/2016 1700   BUN 15 12/16/2016 1700   BUN 23 11/14/2016 1601   CREATININE 0.65 12/16/2016 1700   CREATININE 0.92 11/01/2015 1104   CALCIUM 8.0 (L) 12/16/2016 1700   GFRNONAA >60 12/16/2016 1700   GFRAA >60 12/16/2016 1700   CBC    Component Value Date/Time   WBC 11.5 (H) 12/16/2016 1700   RBC 3.53 (L) 12/16/2016 1700   HGB 11.6 (L) 12/16/2016 1700   HCT 36.4 (L) 12/16/2016 1700   HCT 37.6 11/14/2016 1601   PLT 190 12/16/2016 1700   PLT 181 11/14/2016 1601   MCV 103.1 (H) 12/16/2016 1700   MCV 102 (H) 11/14/2016 1601   MCH 32.9 12/16/2016 1700   MCHC 31.9 12/16/2016 1700   RDW 13.4 12/16/2016 1700   RDW 13.9 11/14/2016 1601   LYMPHSABS 1.0 12/12/2016 0503   LYMPHSABS 1.7 11/14/2016 1601   MONOABS 0.7 12/12/2016 0503   EOSABS 0.1 12/12/2016 0503   EOSABS 0.1 11/14/2016 1601   BASOSABS 0.0 12/12/2016 0503   BASOSABS 0.1 11/14/2016 1601   CBG's (last 3):  No results for input(s): GLUCAP in the last 72 hours. LFT's Lab Results  Component Value Date   ALT 38 12/12/2016   AST 43 (H) 12/12/2016   ALKPHOS 62 12/12/2016   BILITOT 1.7 (H) 12/12/2016    Studies/Results: No results found.  Medications:  I have reviewed the patient's current medications. Scheduled Medications: . ALPRAZolam  0.25 mg Oral QHS  . amiodarone  200 mg Oral BID  . aspirin EC  81 mg Oral Daily  . atorvastatin  80 mg Oral Daily  . B-complex with vitamin C  1 tablet Oral Daily  . dexamethasone  4 mg Oral Q8H  . escitalopram  10 mg Oral QHS  . furosemide  20 mg Oral Daily  . levETIRAcetam  500 mg Oral BID  . magnesium oxide  200 mg Oral Daily  . metoprolol succinate  12.5 mg Oral BID  . potassium chloride  10 mEq Oral Daily  . senna  1 tablet Oral BID   PRN Medications: bisacodyl, HYDROcodone-acetaminophen, ondansetron **OR** ondansetron (ZOFRAN) IV, polyethylene glycol, sorbitol  Assessment/Plan: Principal Problem:   Traumatic subdural hematoma (HCC) Active Problems:   3-vessel coronary artery disease   Persistent atrial fibrillation (HCC)   Chronic obstructive pulmonary disease (HCC)   OSA on CPAP   Seizures (  HCC)   Length of stay, days: 2  Continue IP rehab as ongoing Continue med mgmt of chronic conditions as ongoing, no changes needed Support offered and questions answered  Mahdiya Mossberg A. Felicity Coyer, MD 12/21/2016, 9:59 AM

## 2016-12-21 NOTE — Plan of Care (Signed)
Problem: RH BLADDER ELIMINATION Goal: RH STG MANAGE BLADDER WITH ASSISTANCE STG Manage Bladder With min Assistance   Outcome: Not Progressing Pt I and O cath q8

## 2016-12-22 ENCOUNTER — Inpatient Hospital Stay (HOSPITAL_COMMUNITY): Payer: Medicare Other

## 2016-12-22 ENCOUNTER — Inpatient Hospital Stay (HOSPITAL_COMMUNITY): Payer: Medicare Other | Admitting: Physical Therapy

## 2016-12-22 ENCOUNTER — Inpatient Hospital Stay (HOSPITAL_COMMUNITY): Payer: Medicare Other | Admitting: Occupational Therapy

## 2016-12-22 ENCOUNTER — Inpatient Hospital Stay (HOSPITAL_COMMUNITY): Payer: Medicare Other | Admitting: Speech Pathology

## 2016-12-22 DIAGNOSIS — R7401 Elevation of levels of liver transaminase levels: Secondary | ICD-10-CM

## 2016-12-22 DIAGNOSIS — R1310 Dysphagia, unspecified: Secondary | ICD-10-CM

## 2016-12-22 DIAGNOSIS — E876 Hypokalemia: Secondary | ICD-10-CM

## 2016-12-22 DIAGNOSIS — I481 Persistent atrial fibrillation: Secondary | ICD-10-CM

## 2016-12-22 DIAGNOSIS — D72829 Elevated white blood cell count, unspecified: Secondary | ICD-10-CM

## 2016-12-22 DIAGNOSIS — R740 Nonspecific elevation of levels of transaminase and lactic acid dehydrogenase [LDH]: Secondary | ICD-10-CM

## 2016-12-22 DIAGNOSIS — D539 Nutritional anemia, unspecified: Secondary | ICD-10-CM

## 2016-12-22 LAB — COMPREHENSIVE METABOLIC PANEL
ALT: 130 U/L — ABNORMAL HIGH (ref 17–63)
ANION GAP: 7 (ref 5–15)
AST: 72 U/L — ABNORMAL HIGH (ref 15–41)
Albumin: 2.5 g/dL — ABNORMAL LOW (ref 3.5–5.0)
Alkaline Phosphatase: 55 U/L (ref 38–126)
BILIRUBIN TOTAL: 0.9 mg/dL (ref 0.3–1.2)
BUN: 25 mg/dL — AB (ref 6–20)
CO2: 30 mmol/L (ref 22–32)
Calcium: 8.3 mg/dL — ABNORMAL LOW (ref 8.9–10.3)
Chloride: 103 mmol/L (ref 101–111)
Creatinine, Ser: 0.76 mg/dL (ref 0.61–1.24)
GFR calc Af Amer: >60 mL/min (ref >60)
Glucose, Bld: 121 mg/dL — ABNORMAL HIGH (ref 65–99)
POTASSIUM: 3.8 mmol/L (ref 3.5–5.1)
Sodium: 140 mmol/L (ref 135–145)
TOTAL PROTEIN: 5.7 g/dL — AB (ref 6.5–8.1)

## 2016-12-22 LAB — CBC WITH DIFFERENTIAL/PLATELET
Basophils Absolute: 0 10*3/uL (ref 0.0–0.1)
Basophils Relative: 0 %
Eosinophils Absolute: 0 10*3/uL (ref 0.0–0.7)
Eosinophils Relative: 0 %
HEMATOCRIT: 33.9 % — AB (ref 39.0–52.0)
Hemoglobin: 10.7 g/dL — ABNORMAL LOW (ref 13.0–17.0)
LYMPHS ABS: 0.2 10*3/uL — AB (ref 0.7–4.0)
Lymphocytes Relative: 2 %
MCH: 32.2 pg (ref 26.0–34.0)
MCHC: 31.6 g/dL (ref 30.0–36.0)
MCV: 102.1 fL — AB (ref 78.0–100.0)
MONO ABS: 0.6 10*3/uL (ref 0.1–1.0)
Monocytes Relative: 5 %
NEUTROS ABS: 10.2 10*3/uL — AB (ref 1.7–7.7)
Neutrophils Relative %: 93 %
Platelets: 232 10*3/uL (ref 150–400)
RBC: 3.32 MIL/uL — ABNORMAL LOW (ref 4.22–5.81)
RDW: 13.6 % (ref 11.5–15.5)
WBC: 11 10*3/uL — ABNORMAL HIGH (ref 4.0–10.5)

## 2016-12-22 MED ORDER — ATORVASTATIN CALCIUM 40 MG PO TABS
40.0000 mg | ORAL_TABLET | Freq: Every day | ORAL | Status: DC
  Administered 2016-12-23 – 2017-01-10 (×19): 40 mg via ORAL
  Filled 2016-12-22 (×19): qty 1

## 2016-12-22 NOTE — Progress Notes (Signed)
Speech Language Pathology Daily Session Note  Patient Details  Name: Alex DuhamelJohn R [name] MRN: [id number] Date of Birth: 01-26-1931  Today's Date: 12/22/2016 SLP Individual Time: 1505-1530 SLP Individual Time Calculation (min): 25 min  Short Term Goals: Week 1: SLP Short Term Goal 1 (Week 1): Patient will consume current diet with minimal overt s/s of aspiration with Min A verbal cues.  SLP Short Term Goal 2 (Week 1): Pt will consumed trials of dysphagia 2 with minimal overt s/s of aspiration and Min A verbal cues for complete oral clearing.  SLP Short Term Goal 3 (Week 1): Pt will sustained attention for ~5 minutes with Mod A verbal cues for redirection to tasks. SLP Short Term Goal 4 (Week 1): Pt will answer orientation questions with Max A multimodal cues.  SLP Short Term Goal 5 (Week 1): Pt will follow 1 step functional basic directions to complete ADL task with Min A cues.  SLP Short Term Goal 6 (Week 1): Pt will demonstrate functional problem solving of basic, famaliar tasks with Mod A verbal cues.   Skilled Therapeutic Interventions: Skilled treatment session focused on addressing swallow goals. SLP facilitated session by providing set-up and skilled observation of Dys.2 textures and thin liquids via cup.  Patient demonstrated timely mastication and oral clearance with no overt s/s of aspiration.  Friend, Butch present and asked if he could bring him a cookies and cream milkshake at their next visit SLP said yes.  Recommend to continue with Dys.2 trials; hopeful for advancement this week.      Function:  Eating Eating   Modified Consistency Diet: Yes Eating Assist Level: Set up assist for;Helper checks for pocketed food;Help managing cup/glass;Help with picking up utensils;Helper scoops food on utensil   Eating Set Up Assist For: Opening containers Helper Scoops Food on Utensil: Occasionally Helper Brings Food to Mouth: Occasionally   Cognition Comprehension Comprehension assist  level: Understands basic 50 - 74% of the time/ requires cueing 25 - 49% of the time  Expression   Expression assist level: Expresses basic 50 - 74% of the time/requires cueing 25 - 49% of the time. Needs to repeat parts of sentences.  Social Interaction Social Interaction assist level: Interacts appropriately 50 - 74% of the time - May be physically or verbally inappropriate.  Problem Solving Problem solving assist level: Solves basic 50 - 74% of the time/requires cueing 25 - 49% of the time  Memory Memory assist level: Recognizes or recalls 25 - 49% of the time/requires cueing 50 - 75% of the time    Pain Pain Assessment Pain Assessment: No/denies pain  Therapy/Group: Individual Therapy  Charlane FerrettiMelissa Darrah Dredge, M.A., CCC-SLP [phone]  [name] 12/22/2016, 4:39 PM

## 2016-12-22 NOTE — Progress Notes (Signed)
Physical Therapy Note  Patient Details  Name: Alex [name] MRN: [id number] Date of Birth: [birth year] Today's Date: 12/22/2016    Time: 830-945 75 minutes  1:1 No c/o pain. Pt supervision for bed mobility and sitting edge of bed, min/mod A for sit to stand with RW with cues for UE placement.  Gait throughout session multiple bouts 25'x 2, 70', 35' all with RW with min A.  Pt requires cues for posture and cues to take standing rest breaks when fatigued.  Sit to stand training for LE strength and activity tolerance x 10 with cues for UE placement.  Furniture transfers from recliner and couch with min A from recliner, mod A from couch due to low surface.  Gait in home environment with obstacles and carpet with min A.  Nu step for activity tolerance x 8 minutes level 4.  Pt requires frequent rest breaks due to fatigue but is motivated to participate.  Pt with decreased memory noted throughout session as he requires cues for UE placement with each attempt to sit to stand.   Alex [name] 12/22/2016, 9:43 AM

## 2016-12-22 NOTE — Progress Notes (Signed)
[place] PHYSICAL MEDICINE & REHABILITATION     PROGRESS NOTE  Subjective/Complaints:  Pt seen laying in bed this AM.  He slept well overnight.  He is unaware of the date and then remembers it is his birthday today.    ROS: Denies CP, SOB, N/V/D.  Objective: Vital Signs: Blood pressure 139/86, pulse (!) 57, temperature 98.6 F (37 C), temperature source Oral, resp. rate 16, weight 102 kg (224 lb 13.9 oz), SpO2 97 %. No results found.  Recent Labs  12/22/16 0730  WBC 11.0*  HGB 10.7*  HCT 33.9*  PLT 232    Recent Labs  12/22/16 0730  NA 140  K 3.8  CL 103  GLUCOSE 121*  BUN 25*  CREATININE 0.76  CALCIUM 8.3*   CBG (last 3)  No results for input(s): GLUCAP in the last 72 hours.  Wt Readings from Last 3 Encounters:  12/22/16 102 kg (224 lb 13.9 oz)  12/16/16 103.2 kg (227 lb 8.2 oz)  12/15/16 105.2 kg (232 lb)    Physical Exam:  BP 139/86 (BP Location: Right Arm)   Pulse (!) 57   Temp 98.6 F (37 C) (Oral)   Resp 16   Wt 102 kg (224 lb 13.9 oz)   SpO2 97%   BMI 28.87 kg/m  Constitutional: No distress. Well-developed. Vital signs reviewed.  HENT: Dressing in place.  Eyes: EOMI. No discharge.  Cardiovascular: Murmurheard. IRRR Respiratory: Effort normal. Clear.  GI: Soft. Bowel sounds are normal.  Neurological.Alert and oriented x2.  Motor: B/l UE 4/5 prox to distal.  B/l LE: 4+/5 proximal to distal Skin: Warm and dry.  Psych: pleasant and cooperative but distracted.    Assessment/Plan: 1. Functional deficits secondary to SDH sp craniotomy which require 3+ hours per day of interdisciplinary therapy in a comprehensive inpatient rehab setting. Physiatrist is providing close team supervision and 24 hour management of active medical problems listed below. Physiatrist and rehab team continue to assess barriers to discharge/monitor patient progress toward functional and medical goals.  Function:  Bathing Bathing position Bathing activity did not  occur: Refused    Bathing parts      Bathing assist        Upper Body Dressing/Undressing Upper body dressing Upper body dressing/undressing activity did not occur: Refused                  Upper body assist        Lower Body Dressing/Undressing Lower body dressing   What is the patient wearing?: Pants, Non-skid slipper socks     Pants- Performed by patient: Pull pants up/down Pants- Performed by helper: Thread/unthread right pants leg, Thread/unthread left pants leg   Non-skid slipper socks- Performed by helper: Don/doff right sock                  Lower body assist Assist for lower body dressing: 2 Helpers      Toileting Toileting Toileting activity did not occur: No continent bowel/bladder event Toileting steps completed by patient: Adjust clothing prior to toileting Toileting steps completed by helper: Adjust clothing after toileting, Performs perineal hygiene Toileting Assistive Devices: Grab bar or rail, Other (comment) (RW)  Toileting assist Assist level: Two helpers   Transfers Chair/bed transfer   Chair/bed transfer method: Squat pivot, Stand pivot Chair/bed transfer assist level: Moderate assist (Pt 50 - 74%/lift or lower) (steady assist for stand/pivot with RW) Chair/bed transfer assistive device: Armrests, Patent attorneyWalker     Locomotion Ambulation  Max distance: 20 ft Assist level: Moderate assist (Pt 50 - 74%)   Wheelchair   Type: Manual Max wheelchair distance: 150 Assist Level: Supervision or verbal cues  Cognition Comprehension Comprehension assist level: Understands basic 25 - 49% of the time/ requires cueing 50 - 75% of the time  Expression Expression assist level: Expresses basic 50 - 74% of the time/requires cueing 25 - 49% of the time. Needs to repeat parts of sentences.  Social Interaction Social Interaction assist level: Interacts appropriately 50 - 74% of the time - May be physically or verbally inappropriate.  Problem Solving  Problem solving assist level: Solves basic 25 - 49% of the time - needs direction more than half the time to initiate, plan or complete simple activities  Memory Memory assist level: Recognizes or recalls 25 - 49% of the time/requires cueing 50 - 75% of the time    Medical Problem List and Plan: 1. Decreased functional mobility with altered mental statussecondary to traumatic subdural hematoma status post right frontotemporal parietal craniotomy 12/16/2016  Cont CIR  Noted reviewed and CT reviewed, see above 2. DVT Prophylaxis/Anticoagulation: SCDs. Monitor for any signs of DVT 3. Pain Management: Hydrocodone as needed 4. Mood: Xanax 0.25 mg daily at bedtime,Lexapro 10 mg daily 5. Neuropsych: This patient is notcapable of making decisions on hisown behalf. 6. Skin/Wound Care: Routine skin checks 7. Fluids/Electrolytes/Nutrition: Routine I&Os 8.Seizure prophylaxis. Keppra 500 mg every 12 hours 9.Atrial fibrillation/AVR/CABG/PPM. Continue low-dose aspirin. Amiodarone 200 mg twice a day, Toprol 12.5 mg twice a day. Follow-up per cardiology services 10.Dysphagia. Dysphagia #1 thin liquid diet. Follow-up speech therapy 11.Acute diastolic congestive heart failure. Lasix 20 mg daily. Monitor for any fluid overload 12.Hypokalemia.   K+ 3.8 on 2/26  Cont to monitor 13.History of COPD. Check oxygen saturations every shift 14.History of alcohol abuse. Monitor for withdrawal. Provide counseling as appropriate 15.Constipation. Laxative assistance 16. Transaminitis:   Elevated 2/26  Atorvastatin decreased to 40 on 2/26  Cont to monitor 17. Leukocytosis  WBCs 11.0 on 2/26  Will cont to monitor 18. Macrocytic anemia  Hb 10.7 on 2/26  Cont Vit supplementation  LOS (Days) 3 A FACE TO FACE EVALUATION WAS PERFORMED  Ankit Karis Juba 12/22/2016 9:27 AM

## 2016-12-22 NOTE — Progress Notes (Signed)
Patient information reviewed and entered into eRehab system by Maisley Hainsworth, RN, CRRN, PPS Coordinator.  Information including medical coding and functional independence measure will be reviewed and updated through discharge.     Per nursing patient was given "Data Collection Information Summary for Patients in Inpatient Rehabilitation Facilities with attached "Privacy Act Statement-Health Care Records" upon admission.  

## 2016-12-22 NOTE — Progress Notes (Signed)
Occupational Therapy Session Note  Patient Details  Name: Alex DuhamelJohn R [name] MRN: [id number] Date of Birth: 1930-12-10  Today's Date: 12/22/2016 OT Individual Time: 1100-1200 OT Individual Time Calculation (min): 60 min    Short Term Goals: Week 1:  OT Short Term Goal 1 (Week 1): Pt will perform stand pivot transfer with mod A +1 to BSC/ toilet OT Short Term Goal 2 (Week 1): Pt will perform sit to stand for clothing management with  mod A  OT Short Term Goal 3 (Week 1): Pt will perform bed mobility in prep for ADL task with mod A with bed rails and extra time OT Short Term Goal 4 (Week 1): Pt will be oriented x4 with mod external cues  Skilled Therapeutic Interventions/Progress Updates:    Pt resting in w/c upon arrival with wife present.  Pt requested use of toilet and amb with RW (min A) to bathroom.  Pt requires mod verbal cues for safety awareness with ambulation and sit<>stand.  Pt returned to room and engaged in bathing/dressing with sit<>stand from w/c at sink.  Pt required mod verbal cues for sequencing.  Pt fatigues quickly and required multiple rest breaks throughout session.  Pt noted with decreased LUE coordination.  Pt performed sit<>stand from w/c with min A. Pt required increased assistance for LB bathing/dressing tasks.  Pt returned to w/c with QRB in place and tranistioned to family room with wife.  Focus on activity tolerance, functional amb with RW, sit<>stand, standing balance, and safety awareness to increase independence with BADLs.   Therapy Documentation Precautions:  Precautions Precautions: Fall Precaution Comments: ataxic, poor safety awareness Restrictions Weight Bearing Restrictions: No  Pain:  Pt denied pain  See Function Navigator for Current Functional Status.   Therapy/Group: Individual Therapy  Rich BraveLanier, Jayvyn Haselton Chappell 12/22/2016, 12:09 PM

## 2016-12-22 NOTE — Care Management Note (Signed)
Inpatient Rehabilitation Center Individual Statement of Services  Patient Name:  Alex [name]  Date:  12/22/2016  Welcome to the Inpatient Rehabilitation Center.  Our goal is to provide you with an individualized program based on your diagnosis and situation, designed to meet your specific needs.  With this comprehensive rehabilitation program, you will be expected to participate in at least 3 hours of rehabilitation therapies Monday-Friday, with modified therapy programming on the weekends.  Your rehabilitation program will include the following services:  Physical Therapy (PT), Occupational Therapy (OT), Speech Therapy (ST), 24 hour per day rehabilitation nursing, Therapeutic Recreaction (TR), Neuropsychology, Case Management (Social Worker), Rehabilitation Medicine, Nutrition Services and Pharmacy Services  Weekly team conferences will be held on Tuesdays to discuss your progress.  Your Social Worker will talk with you frequently to get your input and to update you on team discussions.  Team conferences with you and your family in attendance may also be held.  Expected length of stay: 18-22 days  Overall anticipated outcome: supervision  Depending on your progress and recovery, your program may change. Your Social Worker will coordinate services and will keep you informed of any changes. Your Social Worker's name and contact numbers are listed  below.  The following services may also be recommended but are not provided by the Inpatient Rehabilitation Center:   Driving Evaluations  Home Health Rehabiltiation Services  Outpatient Rehabilitation Services    Arrangements will be made to provide these services after discharge if needed.  Arrangements include referral to agencies that provide these services.  Your insurance has been verified to be:  [facility] Your primary doctor is:  Dr. Alben SpittleWeaver  Pertinent information will be shared with your doctor and your insurance company.  Social  Worker:  CocoaLucy Marriah Sanderlin, [place] [phone] or ([phone]) [phone]   Information discussed with and copy given to patient by: Amada JupiterHOYLE, Michale Emmerich, 12/22/2016, 3:18 PM

## 2016-12-22 NOTE — Progress Notes (Signed)
Occupational Therapy Session Note  Patient Details  Name: Alex [name] MRN: [id number] Date of Birth: June 15, 1931  Today's Date: 12/22/2016 OT Individual Time: 1615-1700 OT Individual Time Calculation (min): 45 min    Short Term Goals: Week 1:  OT Short Term Goal 1 (Week 1): Pt will perform stand pivot transfer with mod A +1 to BSC/ toilet OT Short Term Goal 2 (Week 1): Pt will perform sit to stand for clothing management with  mod A  OT Short Term Goal 3 (Week 1): Pt will perform bed mobility in prep for ADL task with mod A with bed rails and extra time OT Short Term Goal 4 (Week 1): Pt will be oriented x4 with mod external cues  Skilled Therapeutic Interventions/Progress Updates:    Pt seen seated in w/c upon arrival with no pain reported and agreeable to tx. Focus of session on activity tolerance and functional mobility with various transfers. Pt sit to stand at high low table with MOD A for lifting to sort cards into suits with Vc to scan to L and posture. Pt completes 2 standing trials lasting up to ~4 min with seated rest break upon request. Pt stand pivot transfer w/c<>mat, recliner and sofa with MOD A for lifting and VC to improve safety awareness and sequencing of transfer. Pt educated on w/c brakes and able to lock/unlock before/after transfer appropriately. In ADL apartment pt stand pivot transfer w/c<>EOB<>supine with MOD A for LE management and VC for safety awareness. Pt ambulates ~50 feet back to room with 1 seated rest break with MIN guarding assist and VC for walker management during turns. Pt left in room supine in bed with son present and all needs met.   Therapy Documentation Precautions:  Precautions Precautions: Fall Precaution Comments: ataxic, poor safety awareness Restrictions Weight Bearing Restrictions: No Pain: Pain Assessment Pain Assessment: No/denies pain ADL:   See Function Navigator for Current Functional Status.   Therapy/Group: Individual  Therapy  Tonny Branch 12/22/2016, 5:24 PM

## 2016-12-22 NOTE — IPOC Note (Signed)
Overall Plan of Care [facility]) Patient Details Name: Alex [name] MRN: [id number] DOB: [birth year]  Admitting Diagnosis: SDH  Hospital Problems: Principal Problem:   Traumatic subdural hematoma (HCC) Active Problems:   3-vessel coronary artery disease   Persistent atrial fibrillation (HCC)   Chronic obstructive pulmonary disease (HCC)   OSA on CPAP   Seizures (HCC)   Transaminitis     Functional Problem List: Nursing Behavior, Bladder, Edema, Medication Management, Endurance, Motor, Nutrition, Pain, Safety, Skin Integrity  PT Balance, Endurance, Motor, Perception, Safety, Sensory  OT Balance, Behavior, Cognition, Endurance, Motor, Perception, Pain, Safety  SLP Cognition, Linguistic  TR         Basic ADL's: OT Grooming, Bathing, Dressing, Toileting     Advanced  ADL's: OT       Transfers: PT Bed Mobility, Bed to Chair, Car, Occupational psychologist, Research scientist (life sciences): PT Ambulation, Psychologist, prison and probation services, Stairs     Additional Impairments: OT None  SLP Swallowing, Communication, Social Cognition comprehension, expression Social Interaction, Problem Solving, Memory, Attention, Awareness  TR      Anticipated Outcomes Item Anticipated Outcome  Self Feeding n/a  Swallowing  Min A   Basic self-care  supervision  Toileting  supervision   Bathroom Transfers supervision  Bowel/Bladder  continent of b/b with mod assist   Transfers  supervision  Locomotion  supervision ambulatory  Communication  Min A   Cognition  Min A  Pain  Pain less than 2 with min assist   Safety/Judgment  pt safety maintained with mod assist    Therapy Plan: PT Intensity: Minimum of 1-2 x/day ,45 to 90 minutes PT Frequency: 5 out of 7 days PT Duration Estimated Length of Stay: 18-22 days OT Intensity: Minimum of 1-2 x/day, 45 to 90 minutes OT Frequency: 5 out of 7 days OT Duration/Estimated Length of Stay: 18-22 DAYS SLP Intensity: Minumum of 1-2 x/day, 30 to 90 minutes SLP  Frequency: 3 to 5 out of 7 days SLP Duration/Estimated Length of Stay: 18 to 22 days       Team Interventions: Nursing Interventions Patient/Family Education, Bladder Management, Disease Management/Prevention, Pain Management, Skin Care/Wound Management, Cognitive Remediation/Compensation, Medication Management, Dysphagia/Aspiration Precaution Training, Discharge Planning  PT interventions Ambulation/gait training, Cognitive remediation/compensation, Discharge planning, DME/adaptive equipment instruction, Functional mobility training, Pain management, Splinting/orthotics, Therapeutic Activities, UE/LE Strength taining/ROM, Warden/ranger, Community reintegration, Development worker, international aid stimulation, Neuromuscular re-education, Patient/family education, Museum/gallery curator, Therapeutic Exercise, UE/LE Coordination activities, Wheelchair propulsion/positioning, Psychosocial support, Visual/perceptual remediation/compensation  OT Interventions    SLP Interventions Cognitive remediation/compensation, Financial trader, Dysphagia/aspiration precaution training, Environmental controls, Functional tasks, Internal/external aids, Patient/family education, Speech/Language facilitation, Therapeutic Activities  TR Interventions    SW/CM Interventions      Team Discharge Planning: Destination: PT-Home ,OT- Home , SLP-Home Projected Follow-up: PT-Home health PT, 24 hour supervision/assistance, OT-  Home health OT, SLP-Home Health SLP, 24 hour supervision/assistance Projected Equipment Needs: PT-To be determined, OT- To be determined, SLP-To be determined Equipment Details: PT- , OT-  Patient/family involved in discharge planning: PT- Patient, Family member/caregiver,  OT-Patient, SLP-Patient  MD ELOS: 18-21 days. Medical Rehab Prognosis:  Good Assessment:  83 y.o.right handed malewith history of morbid obesity, alcohol use, COPD with nighttime oxygen, obstructive sleep apnea with CPAP, atrial  fibrillation/AVR/CABG/PPM maintained on aspirin and Coumadin. Presented 12/03/2016 after a recent fall while in the yard and struck his head without loss of consciousness. Develop dry heaving gradual onset of moderate squeezing chest pain shortness of breath. CT of the  head showed left parietal scalp hematoma. Acute right hemispheric subdural hematoma with maximal thickness in the right posterior parietal region measuring 15 mm. Right to left shift of 7.5 mm. Troponin negative. INR of 4.14 that was reversed. Neurosurgery Dr. Marikay Alaravid Jones advised conservative care. Hospital course confusion with delirium he had been started on Precedex. Soft restraints were added for patient safety. Reported grand mal seizure 12/04/2016 required intubation and currently maintained on Keppra. EEG showed occasional low amplitude epileptiform discharges over the right posterior temporal region 3 clinicoelectrographic seizures captured arising from the right hemisphere lasting 2-4 minutes. Patient remained off anticoagulation due to subdural hematoma. Patient initially NPOwith nasogastric tube for nutritional support however patient did pull his nasogastric tube out. A swallow study 12/11/2016 placed on a full liquid diet.. Latest cranial CT scan 12/10/2016 showedstable right subdural hematoma with stable mass effect. No new large territory infarct or parenchymal hemorrhage identified. Patient was admitted for a comprehensive rehabilitation program02/14/2018. Patient had slow progressive gains. Cardiology consulted 02/16/2018with increasing shortness of breath, tachycardia and wheezing. Chest x-ray consistent with pulmonary edema with small-to-moderate posterior pleural effusion. He did receive intravenous Lasix. His amiodarone was adjusted andLopressor was titrateddue to tachycardia. Patient continued on his full liquid diet. 12/15/2016 patient more lethargic and difficult to arouse. CT of the head showed interval increase in size  of large broad-based right sided subdural hematoma now measuring 21.5 mm maximal thickness versus 9 mm. Increased mass effect upon the right lateral ventricle with midline shift to the left by 14.4 mm versus prior 7.4. Neurosurgery was consulted and patient was discharged to acute care services 12/15/2016 for ongoing monitoring. Underwent right frontotemporal parietal craniotomy for evacuation of subdural hematoma 12/16/2016 per Dr. Bevely Palmeritty. Maintained on Keppra for seizure prophylaxis as well as Decadron protocol.His low-dose aspirin was resumed for history of atrial fibrillation but no Coumadin at this time due to SDH.Mild hypokalemia with supplement added. Pt with resulting functional deficits with mobility, self-care, endurance, cognition. Will set goals for supervision with PT/OT and Min A with SLP.   See Team Conference Notes for weekly updates to the plan of care

## 2016-12-22 NOTE — Progress Notes (Addendum)
Social Work  Have copied psychosocial assessment from prior CIR admit as no information has changed.  Pt "happy to be back".  Goals are better than with earlier admit with most being set for supervision overall.     Patient ID: Alex [name], male   DOB: 1931-04-16, 83 y.o.   MRN: [id number]   Progress Notes Date of Service: 12/15/2016 9:00 AM Alex Pancoast, LCSW  General Practice    [] Hide copied text [] Hover for attribution information Social Work  Social Work Assessment and Plan  Patient Details  Name: Alex [name] MRN: [id number] Date of Birth: 10-17-1931  Today's Date: 12/15/2016  Problem List:      Patient Active Problem List   Diagnosis Date Noted  . Acute pulmonary edema (HCC) 12/12/2016  . Acute diastolic CHF (congestive heart failure) (HCC) 12/12/2016  . Atrial flutter (HCC) 12/12/2016  . Traumatic subdural bleed with LOC of 1 hour to 5 hours 59 minutes (HCC) 12/11/2016  . Class 1 obesity due to excess calories with body mass index (BMI) of 30.0 to 30.9 in adult   . ETOH abuse   . Chronic obstructive pulmonary disease (HCC)   . Supplemental oxygen dependent   . Alcohol use   . OSA on CPAP   . Atrial fibrillation (HCC)   . Coronary artery disease involving coronary bypass graft of native heart without angina pectoris   . Seizures (HCC)   . Dysphagia   . Bradycardia   . Hyperglycemia   . Agitation   . Hypokalemia   . Hypernatremia   . Leukocytosis   . Macrocytic anemia   . Thrombocytopenia (HCC)   . Traumatic subdural hematoma without loss of consciousness (HCC)   . Change in mental status   . Subdural hematoma (HCC) 12/03/2016  . Sick sinus syndrome (HCC) 11/06/2015  . PAF (paroxysmal atrial fibrillation) (HCC) 11/14/2014  . SOB (shortness of breath) 10/31/2014  . Persistent atrial fibrillation (HCC) 10/31/2014  . Aortic stenosis, severe 07/28/2011  . 3-vessel coronary artery disease 07/28/2011  . Hyperlipidemia, mixed 07/28/2011    Past Medical History:      Past Medical History:  Diagnosis Date  . Aortic stenosis    s/p AVR by Alex Alex [name]  . COPD (chronic obstructive pulmonary disease) (HCC)   . History of coronary artery disease    status post stenting of the marginal circumflex in 12/2003 and again in 2009  . Hyperlipidemia   . Morbid obesity (HCC)    weight 243 pounds, BMI 31.2kg/m2, BSA 2.36 square meters  . Obstructive sleep apnea    compliant with CPAP  . Persistent atrial fibrillation [facility])    Past Surgical History:       Past Surgical History:  Procedure Laterality Date  . AORTIC VALVE REPLACEMENT (AVR)/CORONARY ARTERY BYPASS GRAFTING (CABG)   08/05/2011   LIMA to LAD, sequential saphenous vein graft to third and fourth obtuse marginal branches of the circumflex, aortic valve replacement using a 23 mm Edwards pericardial valve  . APPENDECTOMY    . CARDIOVERSION N/A 11/14/2014   Procedure: CARDIOVERSION;  Surgeon: Alex Schultz, MD;  Location: [facility] ENDOSCOPY;  Service: Cardiovascular;  Laterality: N/A;  . CARDIOVERSION N/A 11/20/2016   Procedure: CARDIOVERSION;  Surgeon: Alex Riffle, MD;  Location: [facility] ENDOSCOPY;  Service: Cardiovascular;  Laterality: N/A;  . CAROTID ENDARTERECTOMY     Alex [name]  . EP IMPLANTABLE DEVICE N/A 11/06/2015   Procedure: PPM Generator Changeout;for sick sinus syndrome with a MDT Adapta L  PPM, chronically elevated RV threshold.  . permanent pacemaker     MDT EnRhythm implanted by Alex Alex [name] in [place] for complete heart block with syncope  . REPLACEMENT TOTAL KNEE BILATERAL     2006  . TEE WITHOUT CARDIOVERSION N/A 11/14/2014   Procedure: TRANSESOPHAGEAL ECHOCARDIOGRAM (TEE);  Surgeon: Alex SchultzMark Skains, MD;  [facility];  Service: Cardiovascular;  Laterality: N/A;   Social History:  reports that he quit smoking about 13 years ago. His smoking use included Cigarettes. He started smoking about 43 years ago. He has a 15.00 pack-year  smoking history. He has never used smokeless tobacco. He reports that he drinks alcohol. He reports that he does not use drugs.  Family / Support Systems Marital Status: Married How Long?: 50 yrs (2nd marriage for both) Patient Roles: Spouse, Parent Spouse/Significant Other: wife, Alex [name] @ (C) [phone] or (H) [phone] Children: son, Alex [name] @ ([phone]) [phone] Anticipated Caregiver: wife and hired assist Ability/Limitations of Caregiver: wife can provide supervision level, would hire assist if needed Caregiver Availability: 24/7 Family Dynamics: PT adn wife report that their son, Alex DimmerKerry, is very supportive.  Pt does have a daughter from 1st marriage but has no contact with her.  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  He is denying any emotional distress, however, will likely benefit from a neuropsychology consult during his CIR stay. Recent Psychosocial Issues: None Pyschiatric History: None Substance Abuse History: None  Patient / Family Perceptions, Expectations & Goals Pt/Family understanding of illness & functional limitations: Pt and wife with basic understanding of his fall, SDH and current functional limitations/ need for CIR. Premorbid pt/family roles/activities: Pt was independent overall, but has been  having falls as he refused to use any AD for mobility safety. Anticipated changes in roles/activities/participation: Pt expected to require hands on assistance and wife to assume primary caregiver support role. Pt/family expectations/goals: "I need to get better."  [employer] IncCommunity Resources Community Agencies: None Premorbid Home Care/DME Agencies: None Transportation available at discharge: yes Resource referrals recommended: Neuropsychology, Support group (specify)  Discharge Planning Living Arrangements: Spouse/significant other Support Systems: Spouse/significant other, Children, Manufacturing engineerriends/neighbors, Psychologist, clinicalChurch/faith community Type of Residence: Private residence Insurance Resources: [employer] EntertainmentMedicare ((Multimedia programmerUnited Healthcare Medicare)) Financial Resources: Restaurant manager, fast foodocial Security Financial Screen Referred: No Living Expenses: Own Money Management: Patient Does the patient have any problems obtaining your medications?: No Home Management: pt and wife Patient/Family Preliminary Plans: Pt to return home with wife as primary support.  Son and private duty to assis as well/ available. Social Work Anticipated Follow Up Needs: HH/OP Expected length of stay: ELOS 20 -25 days  Clinical Impression Pleasant gentleman who is more alert today than on Friday when first attempted interview.  He is able to answer general questions accurately (per review with wife).  He denies any emotional distress, however, will monitor and anticipate referral for neuropsychology while on CIR.  Wife very supportive and aware that he will require 24/7 assistance.  Son is supportive  and may need to hire some private duty support as well.  Will follow for support and d/c planning needs.  Amada Jupiter 12/15/2016, 9:00 AM      Electronically signed by Alex Pancoast, LCSW at 12/16/2016 10:16 AM Electronically signed by Alex Pancoast, LCSW at 12/16/2016 10:16 AM      Admission (Discharged) on 12/11/2016        Revision History        Detailed  Report

## 2016-12-22 NOTE — Progress Notes (Signed)
Occupational Therapy Session Note  Patient Details  Name: Alex [name] MRN: [id number] Date of Birth: [birth year]  Today's Date: 12/22/2016 OT Individual Time: 1420-1500 OT Individual Time Calculation (min): 40 min    Short Term Goals: Week 1:  OT Short Term Goal 1 (Week 1): Pt will perform stand pivot transfer with mod A +1 to BSC/ toilet OT Short Term Goal 2 (Week 1): Pt will perform sit to stand for clothing management with  mod A  OT Short Term Goal 3 (Week 1): Pt will perform bed mobility in prep for ADL task with mod A with bed rails and extra time OT Short Term Goal 4 (Week 1): Pt will be oriented x4 with mod external cues  Skilled Therapeutic Interventions/Progress Updates:    Treatment session with focus on activity tolerance, sit > stand, standing balance, and functional use of LUE.  Pt received upright in w/c upon arrival with no c/o pain.  Engaged in standing activity with focus on upright standing posture and LUE fine and gross motor control.  Utilized resistive peg board, requiring pt to replicate pattern on picture.  Pt demonstrating difficulty with gross and fine motor control with pt often picking up incorrect peg due to decreased coordination, pt reports sensation intact.  Min cues for upright standing posture during activity.  Pt required min assist for each sit > stand and min guard during standing activity.  Ambulated 82 feet with RW and min assist, then returned to room via w/c total assist due to time constraints.   Therapy Documentation Precautions:  Precautions Precautions: Fall Precaution Comments: ataxic, poor safety awareness Restrictions Weight Bearing Restrictions: No Pain:  Pt with no c/o pain  See Function Navigator for Current Functional Status.   Therapy/Group: Individual Therapy  [name], Giles Currie 12/22/2016, 3:28 PM

## 2016-12-23 ENCOUNTER — Inpatient Hospital Stay (HOSPITAL_COMMUNITY): Payer: Medicare Other | Admitting: Occupational Therapy

## 2016-12-23 ENCOUNTER — Inpatient Hospital Stay (HOSPITAL_COMMUNITY): Payer: Medicare Other | Admitting: Physical Therapy

## 2016-12-23 ENCOUNTER — Inpatient Hospital Stay (HOSPITAL_COMMUNITY): Payer: Medicare Other

## 2016-12-23 ENCOUNTER — Inpatient Hospital Stay (HOSPITAL_COMMUNITY): Payer: Medicare Other | Admitting: Speech Pathology

## 2016-12-23 DIAGNOSIS — I10 Essential (primary) hypertension: Secondary | ICD-10-CM

## 2016-12-23 DIAGNOSIS — R569 Unspecified convulsions: Secondary | ICD-10-CM

## 2016-12-23 DIAGNOSIS — I2510 Atherosclerotic heart disease of native coronary artery without angina pectoris: Secondary | ICD-10-CM

## 2016-12-23 MED ORDER — LIDOCAINE HCL 2 % EX GEL
1.0000 "application " | Freq: Once | CUTANEOUS | Status: AC
  Administered 2016-12-23: 1 via URETHRAL

## 2016-12-23 MED ORDER — BETHANECHOL CHLORIDE 10 MG PO TABS
10.0000 mg | ORAL_TABLET | Freq: Three times a day (TID) | ORAL | Status: DC
  Administered 2016-12-23 – 2016-12-24 (×2): 10 mg via ORAL
  Filled 2016-12-23 (×4): qty 1

## 2016-12-23 NOTE — Progress Notes (Signed)
[facility] PHYSICAL MEDICINE & REHABILITATION     PROGRESS NOTE  Subjective/Complaints:  Pt seen sitting up in bed working with SLP.  He states eh slept well overnight.  He asks if he can go home at nights because he lives close to the hospital.   ROS: Denies CP, SOB, N/V/D.  Objective: Vital Signs: Blood pressure (!) 147/92, pulse 64, temperature 98 F (36.7 C), temperature source Oral, resp. rate 18, weight 103.8 kg (228 lb 13.4 oz), SpO2 98 %. No results found.  Recent Labs  12/22/16 0730  WBC 11.0*  HGB 10.7*  HCT 33.9*  PLT 232    Recent Labs  12/22/16 0730  NA 140  K 3.8  CL 103  GLUCOSE 121*  BUN 25*  CREATININE 0.76  CALCIUM 8.3*   CBG (last 3)  No results for input(s): GLUCAP in the last 72 hours.  Wt Readings from Last 3 Encounters:  12/23/16 103.8 kg (228 lb 13.4 oz)  12/16/16 103.2 kg (227 lb 8.2 oz)  12/15/16 105.2 kg (232 lb)    Physical Exam:  BP (!) 147/92 (BP Location: Right Arm)   Pulse 64   Temp 98 F (36.7 C) (Oral)   Resp 18   Wt 103.8 kg (228 lb 13.4 oz)   SpO2 98%   BMI 29.38 kg/m  Constitutional: No distress. Well-developed. Vital signs reviewed.  HENT: Dressings in place.  Eyes: EOMI. No discharge.  Cardiovascular: Murmurheard. IRRR Respiratory: Effort normal. Clear.  GI: Soft. Bowel sounds are normal.  Neurological.Alert and oriented x3.  Motor: B/l UE 4/5 prox to distal.  B/l LE: 4+/5 proximal to distal Skin: Warm and dry.  Psych: pleasant and cooperative.    Assessment/Plan: 1. Functional deficits secondary to SDH sp craniotomy which require 3+ hours per day of interdisciplinary therapy in a comprehensive inpatient rehab setting. Physiatrist is providing close team supervision and 24 hour management of active medical problems listed below. Physiatrist and rehab team continue to assess barriers to discharge/monitor patient progress toward functional and medical goals.  Function:  Bathing Bathing position Bathing  activity did not occur: Refused Position: Wheelchair/chair at sink  Bathing parts Body parts bathed by patient: Right arm, Left arm, Chest, Abdomen, Front perineal area, Right upper leg, Left upper leg Body parts bathed by helper: Buttocks, Right lower leg, Left lower leg, Back  Bathing assist        Upper Body Dressing/Undressing Upper body dressing Upper body dressing/undressing activity did not occur: Refused What is the patient wearing?: Pull over shirt/dress     Pull over shirt/dress - Perfomed by patient: Thread/unthread right sleeve, Pull shirt over trunk          Upper body assist        Lower Body Dressing/Undressing Lower body dressing   What is the patient wearing?: Underwear, Pants, Non-skid slipper socks   Underwear - Performed by helper: Thread/unthread right underwear leg Pants- Performed by patient: Thread/unthread left pants leg, Pull pants up/down Pants- Performed by helper: Thread/unthread right pants leg, Thread/unthread left pants leg, Pull pants up/down   Non-skid slipper socks- Performed by helper: Don/doff right sock, Don/doff left sock                  Lower body assist Assist for lower body dressing: 2 Helpers      Toileting Toileting Toileting activity did not occur: No continent bowel/bladder event Toileting steps completed by patient: Performs perineal hygiene Toileting steps completed by helper: Adjust clothing prior  to toileting, Adjust clothing after toileting Toileting Assistive Devices: Grab bar or rail  Toileting assist Assist level: Two helpers   Transfers Chair/bed transfer   Chair/bed transfer method: Stand pivot Chair/bed transfer assist level: Moderate assist (Pt 50 - 74%/lift or lower) Chair/bed transfer assistive device: Armrests, Patent attorneyWalker     Locomotion Ambulation     Max distance: 20 ft Assist level: Moderate assist (Pt 50 - 74%)   Wheelchair   Type: Manual Max wheelchair distance: 150 Assist Level: Supervision  or verbal cues  Cognition Comprehension Comprehension assist level: Understands basic 75 - 89% of the time/ requires cueing 10 - 24% of the time  Expression Expression assist level: Expresses basic 75 - 89% of the time/requires cueing 10 - 24% of the time. Needs helper to occlude trach/needs to repeat words.  Social Interaction Social Interaction assist level: Interacts appropriately 75 - 89% of the time - Needs redirection for appropriate language or to initiate interaction.  Problem Solving Problem solving assist level: Solves basic 50 - 74% of the time/requires cueing 25 - 49% of the time  Memory Memory assist level: Recognizes or recalls 25 - 49% of the time/requires cueing 50 - 75% of the time    Medical Problem List and Plan: 1. 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 DVT Prophylaxis/Anticoagulation: SCDs. Monitor for any signs of DVT 3. Pain Management: Hydrocodone as needed 4. Mood: Xanax 0.25 mg daily at bedtime,Lexapro 10 mg daily 5. Neuropsych: This patient is notcapable of making decisions on hisown behalf. 6. Skin/Wound Care: Routine skin checks 7. Fluids/Electrolytes/Nutrition: Routine I&Os 8.Seizure prophylaxis. Keppra 500 mg every 12 hours 9.Atrial fibrillation/AVR/CABG/PPM. Continue low-dose aspirin. Amiodarone 200 mg twice a day, Toprol 12.5 mg twice a day. Follow-up per cardiology services 10.Dysphagia. Dysphagia #1 thin liquid diet. Follow-up speech therapy 11.Acute diastolic congestive heart failure. Lasix 20 mg daily. Monitor for any fluid overload 12.Hypokalemia.   K+ 3.8 on 2/26  Cont to monitor 13.History of COPD. Check oxygen saturations every shift 14.History of alcohol abuse. Monitor for withdrawal. Provide counseling as appropriate 15.Constipation. Laxative assistance 16. Transaminitis:   Elevated  2/26  Atorvastatin decreased to 40 on 2/26  Cont to monitor 17. Leukocytosis  Afebrile  WBCs 11.0 on 2/26  Will cont to monitor 18. Macrocytic anemia  Hb 10.7 on 2/26  Cont Vit supplementation 19. HTN  Likely reactive  Cont to monitor  LOS (Days) 4 A FACE TO FACE EVALUATION WAS PERFORMED  Ankit Karis Jubanil Patel 12/23/2016 9:29 AM

## 2016-12-23 NOTE — Progress Notes (Addendum)
Physical Therapy Session Note  Patient Details  Name: Jetty DuhamelJohn R Clute MRN: [id number] Date of Birth: 1931-03-21  Today's Date: 12/23/2016 PT Individual Time: 1306-1400 PT Individual Time Calculation (min): 54 min   Short Term Goals: Week 1:  PT Short Term Goal 1 (Week 1): pt will transfer consistently with min assist and LRAD PT Short Term Goal 2 (Week 1): Pt will ambulate 7450' with LRAD and min assist PT Short Term Goal 3 (Week 1): pt will demonstrate intellectual awareness with min cues in 25% of opportunities  Skilled Therapeutic Interventions/Progress Updates:    Pt received in w/c finishing lunch. Therapist provided supervision while pt finished eating peaches; pt required cuing x 1 time to take small sips of water between bites. Transported pt room>gym via w/c total assist for time management. Pt ambulated 100 ft + 70 ft + 60 ft with RW & min assist with cuing for forward gaze and to ambulate within base of RW, which pt has difficulty doing during turns. Pt negotiated 4 steps with B rails and steady assist with step-over-step. Pt performed BLE hip/knee flexion exercises with min assist & RW for balance. Pt attempted to propel w/c back to room, and did so ~50 ft with BUE, but pt with difficulty gripping L wheel with LUE. At end of session pt assisted back to bed & left with all needs within reach & alarm set.  Pt noted fatigue & required multiple rest breaks throughout session.   Vitals during session:  BP = 139/54 mmHg HR = 84 bpm SpO2 = 100%  Therapy Documentation Precautions:  Precautions Precautions: Fall Precaution Comments: ataxic, poor safety awareness Restrictions Weight Bearing Restrictions: No   See Function Navigator for Current Functional Status.   Therapy/Group: Individual Therapy  Sandi MariscalVictoria M Rande Dario 12/23/2016, 5:36 PM

## 2016-12-23 NOTE — Progress Notes (Signed)
Occupational Therapy Session Note  Patient Details  Name: Alex DuhamelJohn R Wojtaszek MRN: [id number] Date of Birth: 12-Jan-1931  Today's Date: 12/23/2016 OT Individual Time:  1415- 1501   46 minutes of skilled OT intervention  Today's Date: 12/23/2016 OT Missed Time:  14 minutes Missed Time Reason:  nursing care   Short Term Goals: Week 1:  OT Short Term Goal 1 (Week 1): Pt will perform stand pivot transfer with mod A +1 to BSC/ toilet OT Short Term Goal 2 (Week 1): Pt will perform sit to stand for clothing management with  mod A  OT Short Term Goal 3 (Week 1): Pt will perform bed mobility in prep for ADL task with mod A with bed rails and extra time OT Short Term Goal 4 (Week 1): Pt will be oriented x4 with mod external cues  Skilled Therapeutic Interventions/Progress Updates:    Upon entering the room, pt supine in bed and very fatigue from prior therapy sessions. Pt declined OOB tasks this session. Pt had 10 cards from birthday in envelopes. Envelopes already cut open and pt having to utilize B hand coordination to remove from envelope, open, and return to envelope. Pt required increased time and dropping 3/10 cards when attempting to open. NT arrived needing to perform I and O catheter. Pt requesting to attempt to void in bathroom first. Pt ambulated with min A and use of RW. Pt standing at toilet with steady assistance for balance and able to void very small amount. Pt having to return to bed for nursing care at this time. Call bell and all needed items within reach upon exiting the room.   Therapy Documentation Precautions:  Precautions Precautions: Fall Precaution Comments: ataxic, poor safety awareness Restrictions Weight Bearing Restrictions: No  See Function Navigator for Current Functional Status.   Therapy/Group: Individual Therapy  Alen BleacherBradsher, Brett Darko P 12/23/2016, 7:27 PM

## 2016-12-23 NOTE — Progress Notes (Signed)
Occupational Therapy Session Note  Patient Details  Name: Alex DuhamelJohn R [name] MRN: [id number] Date of Birth: 1931/06/12  Today's Date: 12/23/2016 OT Individual Time: 7829-56210830-0915 OT Individual Time Calculation (min): 45 min    Short Term Goals: Week 1:  OT Short Term Goal 1 (Week 1): Pt will perform stand pivot transfer with mod A +1 to BSC/ toilet OT Short Term Goal 2 (Week 1): Pt will perform sit to stand for clothing management with  mod A  OT Short Term Goal 3 (Week 1): Pt will perform bed mobility in prep for ADL task with mod A with bed rails and extra time OT Short Term Goal 4 (Week 1): Pt will be oriented x4 with mod external cues     Skilled Therapeutic Interventions/Progress Updates:    Pt stated he was feeling pretty tired after his birthday celebrations yesterday but would do his best. Spouse present for education. Pt agreeable to B/d from EOB.  Focus of treatment was on awareness of LUE as he has severely impaired kinesthesia to be aware of how to position L arm for UB dressing and coordination to hold LB clothing with L hand.  Pt was more successful with visual attention to L hand and mod cues to keep eyes on L hand.  He was able to use L hand without looking to pull pants over hips.  Pt was able to sit to stand several times from EOB with only touching A and maintained balance with min A during clothing management.  He did need a few rest breaks when he was breathing more heavily. Overall, excellent participation.  Pt adjusted back in bed at end of session to rest until his next therapy. Spouse with pt in the room.  Therapy Documentation Precautions:  Precautions Precautions: Fall Precaution Comments: ataxic, poor safety awareness Restrictions Weight Bearing Restrictions: No  Pain: Pain Assessment Pain Assessment: No/denies pain ADL:   See Function Navigator for Current Functional Status.   Therapy/Group: Individual Therapy  Maxwell Lemen 12/23/2016, 1:08 PM

## 2016-12-23 NOTE — Progress Notes (Signed)
Occupational Therapy Note  Patient Details  Name: Alex DuhamelJohn R [name] MRN: [id number] Date of Birth: [birth year]  Today's Date: 12/23/2016 OT Individual Time: 1000-1100 OT Individual Time Calculation (min): 60 min   Pt denied pain Individual therapy  Pt resting in bed upon arrival with wife present.  Pt completed BADLs with earlier OT. Pt amb with RW to w/c at sink to complete oral care.  Pt required assistance to apply adhesive to upper dentures.  Pt transitioned to ADL apartment and practiced walk in shower transfers with min A.  Pt practiced bed mobility on regular bed.  Pt also practiced bed mobility on elevated surface at supervision level.  Pt fatigues easily and requires multiple rest breaks.  Pt returned to room in w/c and remained in w/c with QRB in place and wife present.     Lavone NeriLanier, Hilmer Aliberti [facility] LLCChappell 12/23/2016, 11:01 AM

## 2016-12-23 NOTE — Progress Notes (Signed)
Speech Language Pathology Daily Session Note  Patient Details  Name: Alex [name] MRN: [id number] Date of Birth: 29-Jul-1931  Today's Date: 12/23/2016 SLP Individual Time: [id number] SLP Individual Time Calculation (min): 45 min  Short Term Goals: Week 1: SLP Short Term Goal 1 (Week 1): Patient will consume current diet with minimal overt s/s of aspiration with Min A verbal cues.  SLP Short Term Goal 2 (Week 1): Pt will consumed trials of dysphagia 2 with minimal overt s/s of aspiration and Min A verbal cues for complete oral clearing.  SLP Short Term Goal 3 (Week 1): Pt will sustained attention for ~5 minutes with Mod A verbal cues for redirection to tasks. SLP Short Term Goal 4 (Week 1): Pt will answer orientation questions with Max A multimodal cues.  SLP Short Term Goal 5 (Week 1): Pt will follow 1 step functional basic directions to complete ADL task with Min A cues.  SLP Short Term Goal 6 (Week 1): Pt will demonstrate functional problem solving of basic, famaliar tasks with Mod A verbal cues.   Skilled Therapeutic Interventions: Skilled treatment session focused on dysphagia and cognitive goals. Patient independently oriented to date and situation with extra time but asked the physician if he could go home at nights to improve sleep. Patient consumed breakfast tray of Dys. 1 textures with thin liquids without overt s/s of aspiration but required Min-Mod A verbal cues for use of alternating solids/liquids to clear oral residue. Patient demonstrated sustained attention to self-feeding for ~20 minutes with supervision verbal cues for redirection. Patient left upright in bed with alarm on and all needs within reach. Continue with current plan of care.      Function:  Eating Eating   Modified Consistency Diet: Yes Eating Assist Level: Set up assist for;Helper checks for pocketed food;Supervision or verbal cues   Eating Set Up Assist For: Opening containers Helper Scoops Food on Utensil:  Occasionally Helper Brings Food to Mouth: Occasionally   Cognition Comprehension Comprehension assist level: Understands basic 75 - 89% of the time/ requires cueing 10 - 24% of the time  Expression   Expression assist level: Expresses basic 75 - 89% of the time/requires cueing 10 - 24% of the time. Needs helper to occlude trach/needs to repeat words.  Social Interaction Social Interaction assist level: Interacts appropriately 75 - 89% of the time - Needs redirection for appropriate language or to initiate interaction.  Problem Solving Problem solving assist level: Solves basic 50 - 74% of the time/requires cueing 25 - 49% of the time  Memory Memory assist level: Recognizes or recalls 25 - 49% of the time/requires cueing 50 - 75% of the time    Pain No/Denies Pain   Therapy/Group: Individual Therapy  Alex [name] 12/23/2016, 8:33 AM

## 2016-12-24 ENCOUNTER — Inpatient Hospital Stay (HOSPITAL_COMMUNITY): Payer: Medicare Other

## 2016-12-24 ENCOUNTER — Inpatient Hospital Stay (HOSPITAL_COMMUNITY): Payer: Medicare Other | Admitting: Physical Therapy

## 2016-12-24 ENCOUNTER — Inpatient Hospital Stay (HOSPITAL_COMMUNITY): Payer: Medicare Other | Admitting: Speech Pathology

## 2016-12-24 ENCOUNTER — Inpatient Hospital Stay (HOSPITAL_COMMUNITY): Payer: Medicare Other | Admitting: Occupational Therapy

## 2016-12-24 DIAGNOSIS — Z9989 Dependence on other enabling machines and devices: Secondary | ICD-10-CM

## 2016-12-24 DIAGNOSIS — G4733 Obstructive sleep apnea (adult) (pediatric): Secondary | ICD-10-CM

## 2016-12-24 DIAGNOSIS — I10 Essential (primary) hypertension: Secondary | ICD-10-CM

## 2016-12-24 DIAGNOSIS — R339 Retention of urine, unspecified: Secondary | ICD-10-CM

## 2016-12-24 LAB — URINALYSIS, ROUTINE W REFLEX MICROSCOPIC
BILIRUBIN URINE: NEGATIVE
GLUCOSE, UA: NEGATIVE mg/dL
KETONES UR: NEGATIVE mg/dL
Nitrite: POSITIVE — AB
PH: 5 (ref 5.0–8.0)
Protein, ur: NEGATIVE mg/dL
Specific Gravity, Urine: 1.012 (ref 1.005–1.030)

## 2016-12-24 MED ORDER — ASPIRIN 81 MG PO CHEW
81.0000 mg | CHEWABLE_TABLET | Freq: Every day | ORAL | Status: DC
  Administered 2016-12-24 – 2017-01-10 (×18): 81 mg via ORAL
  Filled 2016-12-24 (×18): qty 1

## 2016-12-24 MED ORDER — LEVETIRACETAM 100 MG/ML PO SOLN
500.0000 mg | Freq: Two times a day (BID) | ORAL | Status: DC
  Administered 2016-12-24 – 2016-12-31 (×16): 500 mg via ORAL
  Filled 2016-12-24 (×16): qty 5

## 2016-12-24 MED ORDER — BETHANECHOL CHLORIDE 25 MG PO TABS
25.0000 mg | ORAL_TABLET | Freq: Three times a day (TID) | ORAL | Status: DC
  Administered 2016-12-24 – 2016-12-27 (×10): 25 mg via ORAL
  Filled 2016-12-24 (×10): qty 1

## 2016-12-24 MED ORDER — LISINOPRIL 2.5 MG PO TABS
2.5000 mg | ORAL_TABLET | Freq: Every day | ORAL | Status: DC
  Administered 2016-12-24 – 2016-12-30 (×7): 2.5 mg via ORAL
  Filled 2016-12-24 (×8): qty 1

## 2016-12-24 NOTE — Progress Notes (Signed)
Physical Therapy Session Note  Patient Details  Name: Alex DuhamelJohn R [name] MRN: [id number] Date of Birth: April 26, 1931  Today's Date: 12/24/2016 PT Individual Time: 0805-0904 PT Individual Time Calculation (min): 59 min   Short Term Goals: Week 1:  PT Short Term Goal 1 (Week 1): pt will transfer consistently with min assist and LRAD PT Short Term Goal 2 (Week 1): Pt will ambulate 1150' with LRAD and min assist PT Short Term Goal 3 (Week 1): pt will demonstrate intellectual awareness with min cues in 25% of opportunities  Skilled Therapeutic Interventions/Progress Updates:    Pt received in bed & agreeable to tx, denying c/o pain. Pt requesting to eat breakfast and transferred supine<>sitting EOB with supervision & use of bed rails. Pt tolerated sitting EOB ~45 minutes with intermittent 1UE support while consuming breakfast. Pt consumed breakfast with LUE with task focusing on forced use of LUE for NMR. As task progressed pt with improving ability to bring spoon to mouth and manipulate utensil. Provided pt with built up handle for increased grasp but pt dropped spoon ~5 times during session and required mod assist to place utensil in hand. Pt also with poor fine motor control and requires HOH to open orange juice containers. Pt with poor attention to left during session and also requires frequent cuing to consume liquids between bites. At end of session pt requesting to lie down; pt returned to supine in bed & was left in room with all needs within reach, bed alarm set & wife present.   Therapy Documentation Precautions:  Precautions Precautions: Fall Precaution Comments: ataxic, poor safety awareness Restrictions Weight Bearing Restrictions: No  See Function Navigator for Current Functional Status.   Therapy/Group: Individual Therapy  Sandi MariscalVictoria M Muad Noga 12/24/2016, 12:35 PM

## 2016-12-24 NOTE — Progress Notes (Addendum)
[facility] PHYSICAL MEDICINE & REHABILITATION     PROGRESS NOTE  Subjective/Complaints:  Pt seen laying in bed this AM.  He slept well overnight night.  Spoke to nursing and patient, who is needed to be cathed with foul urine.    ROS: Denies CP, SOB, N/V/D.  Objective: Vital Signs: Blood pressure (!) 149/47, pulse (!) 58, temperature 97.4 F (36.3 C), temperature source Oral, resp. rate 16, weight 103.2 kg (227 lb 8.2 oz), SpO2 99 %. No results found.  Recent Labs  12/22/16 0730  WBC 11.0*  HGB 10.7*  HCT 33.9*  PLT 232    Recent Labs  12/22/16 0730  NA 140  K 3.8  CL 103  GLUCOSE 121*  BUN 25*  CREATININE 0.76  CALCIUM 8.3*   CBG (last 3)  No results for input(s): GLUCAP in the last 72 hours.  Wt Readings from Last 3 Encounters:  12/24/16 103.2 kg (227 lb 8.2 oz)  12/16/16 103.2 kg (227 lb 8.2 oz)  12/15/16 105.2 kg (232 lb)    Physical Exam:  BP (!) 149/47 (BP Location: Right Arm)   Pulse (!) 58   Temp 97.4 F (36.3 C) (Oral)   Resp 16   Wt 103.2 kg (227 lb 8.2 oz)   SpO2 99%   BMI 29.21 kg/m  Constitutional: No distress. Well-developed. Vital signs reviewed.  HENT: Dressings in place.  Eyes: EOMI. No discharge.  Cardiovascular: Murmurheard. IRRR. No JVD. Respiratory: Effort normal. Clear.  GI: Soft. Bowel sounds are normal.  Neurological.Alert and oriented x2.  Motor: B/l UE 4/5 prox to distal.  B/l LE: 4+/5 proximal to distal (stable) Skin: Warm and dry.  Psych: pleasant and cooperative.    Assessment/Plan: 1. Functional deficits secondary to SDH sp craniotomy which require 3+ hours per day of interdisciplinary therapy in a comprehensive inpatient rehab setting. Physiatrist is providing close team supervision and 24 hour management of active medical problems listed below. Physiatrist and rehab team continue to assess barriers to discharge/monitor patient progress toward functional and medical goals.  Function:  Bathing Bathing position  Bathing activity did not occur: Refused Position: Sitting EOB  Bathing parts Body parts bathed by patient: Right arm, Left arm, Chest, Abdomen, Front perineal area, Right upper leg, Left upper leg, Buttocks Body parts bathed by helper: Right lower leg, Left lower leg, Back  Bathing assist Assist Level: Touching or steadying assistance(Pt > 75%)      Upper Body Dressing/Undressing Upper body dressing Upper body dressing/undressing activity did not occur: Refused What is the patient wearing?: Pull over shirt/dress     Pull over shirt/dress - Perfomed by patient: Thread/unthread right sleeve, Pull shirt over trunk, Thread/unthread left sleeve (mod cues for L arm) Pull over shirt/dress - Perfomed by helper: Put head through opening        Upper body assist Assist Level: Touching or steadying assistance(Pt > 75%)      Lower Body Dressing/Undressing Lower body dressing   What is the patient wearing?: Underwear, Pants, Non-skid slipper socks Underwear - Performed by patient: Pull underwear up/down, Thread/unthread right underwear leg Underwear - Performed by helper: Thread/unthread left underwear leg Pants- Performed by patient: Thread/unthread right pants leg, Pull pants up/down Pants- Performed by helper: Thread/unthread left pants leg   Non-skid slipper socks- Performed by helper: Don/doff right sock, Don/doff left sock                  Lower body assist Assist for lower body dressing: Touching or  steadying assistance (Pt > 75%)      Toileting Toileting Toileting activity did not occur: No continent bowel/bladder event Toileting steps completed by patient: Performs perineal hygiene Toileting steps completed by helper: Adjust clothing prior to toileting, Adjust clothing after toileting Toileting Assistive Devices: Grab bar or rail  Toileting assist Assist level: Two helpers   Transfers Chair/bed transfer   Chair/bed transfer method: Stand pivot Chair/bed transfer assist  level: Moderate assist (Pt 50 - 74%/lift or lower) Chair/bed transfer assistive device: Armrests, Patent attorney     Max distance: 100 ft Assist level: Touching or steadying assistance (Pt > 75%)   Wheelchair   Type: Manual Max wheelchair distance: 150 Assist Level: Supervision or verbal cues  Cognition Comprehension Comprehension assist level: Understands basic 75 - 89% of the time/ requires cueing 10 - 24% of the time  Expression Expression assist level: Expresses basic 75 - 89% of the time/requires cueing 10 - 24% of the time. Needs helper to occlude trach/needs to repeat words.  Social Interaction Social Interaction assist level: Interacts appropriately 75 - 89% of the time - Needs redirection for appropriate language or to initiate interaction.  Problem Solving Problem solving assist level: Solves basic 50 - 74% of the time/requires cueing 25 - 49% of the time  Memory Memory assist level: Recognizes or recalls 25 - 49% of the time/requires cueing 50 - 75% of the time    Medical Problem List and Plan: 1. 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 DVT Prophylaxis/Anticoagulation: SCDs. Monitor for any signs of DVT 3. Pain Management: Hydrocodone as needed 4. Mood: Xanax 0.25 mg daily at bedtime,Lexapro 10 mg daily 5. Neuropsych: This patient is notcapable of making decisions on hisown behalf. 6. Skin/Wound Care: Routine skin checks 7. Fluids/Electrolytes/Nutrition: Routine I&Os 8.Seizure prophylaxis. Keppra 500 mg every 12 hours 9.Atrial fibrillation/AVR/CABG/PPM. Continue low-dose aspirin. Amiodarone 200 mg twice a day, Toprol 12.5 mg twice a day. Follow-up per cardiology services 10.Dysphagia. Dysphagia #1 thin liquid diet. Follow-up speech therapy 11.Acute diastolic congestive heart failure. Lasix 20 mg daily. Monitor  for any fluid overload 12.Hypokalemia.   K+ 3.8 on 2/26  Cont to monitor 13.History of COPD. Check oxygen saturations every shift 14.History of alcohol abuse. Monitor for withdrawal. Provide counseling as appropriate 15.Constipation. Laxative assistance 16. Transaminitis:   Elevated 2/26  Atorvastatin decreased to 40 on 2/26  Cont to monitor 17. Leukocytosis  Afebrile  WBCs 11.0 on 2/26  Will cont to monitor 18. Macrocytic anemia  Hb 10.7 on 2/26  Cont Vit supplementation 19. HTN  Lose dose Lisinopril started 2/28  Cont to monitor 20. Urinary retention  Bethanechol increased to 25 on 2/28  UA/Ucx ordered  LOS (Days) 5 A FACE TO FACE EVALUATION WAS PERFORMED  Kathaleen Dudziak Karis Juba 12/24/2016 9:04 AM

## 2016-12-24 NOTE — Progress Notes (Signed)
Speech Language Pathology Daily Session Note  Patient Details  Name: Alex DuhamelJohn R [name] MRN: [id number] Date of Birth: 1930-12-14  Today's Date: 12/24/2016 SLP Individual Time: 1330-1430 SLP Individual Time Calculation (min): 60 min  Short Term Goals: Week 1: SLP Short Term Goal 1 (Week 1): Patient will consume current diet with minimal overt s/s of aspiration with Min A verbal cues.  SLP Short Term Goal 2 (Week 1): Pt will consumed trials of dysphagia 2 with minimal overt s/s of aspiration and Min A verbal cues for complete oral clearing.  SLP Short Term Goal 3 (Week 1): Pt will sustained attention for ~5 minutes with Mod A verbal cues for redirection to tasks. SLP Short Term Goal 4 (Week 1): Pt will answer orientation questions with Max A multimodal cues.  SLP Short Term Goal 5 (Week 1): Pt will follow 1 step functional basic directions to complete ADL task with Min A cues.  SLP Short Term Goal 6 (Week 1): Pt will demonstrate functional problem solving of basic, famaliar tasks with Mod A verbal cues.   Skilled Therapeutic Interventions: Skilled treatment session focused on addressing dysphagia and cognition goals. SLP facilitated session by providing Min physical assist during tray set-up as well as skilled observation of patient self-feeding Dys.1 textures and thin liquids with Mod A verbal cues to alternate solids and liquids; however, when cues were faded there was no impact on swallow safety.  As a result, Dys.2 textures were also attempted.  Patient consumed with similar oral clearance and no overt s/s of aspiration.  Recommend initiation of a Dys.2 texture diet tomorrow 12/25/16.  Patient demonstrated sustained attention to self-feeding for ~50 minutes with supervision verbal cues for redirection.  Continue with current plan of care.   Function:  Eating Eating   Modified Consistency Diet: Yes Eating Assist Level: Set up assist for;Supervision or verbal cues;Helper checks for pocketed food    Eating Set Up Assist For: Opening containers       Cognition Comprehension Comprehension assist level: Understands basic 75 - 89% of the time/ requires cueing 10 - 24% of the time  Expression   Expression assist level: Expresses basic 75 - 89% of the time/requires cueing 10 - 24% of the time. Needs helper to occlude trach/needs to repeat words.  Social Interaction Social Interaction assist level: Interacts appropriately 75 - 89% of the time - Needs redirection for appropriate language or to initiate interaction.  Problem Solving Problem solving assist level: Solves basic 50 - 74% of the time/requires cueing 25 - 49% of the time  Memory Memory assist level: Recognizes or recalls 25 - 49% of the time/requires cueing 50 - 75% of the time    Pain Pain Assessment Pain Assessment: No/denies pain  Therapy/Group: Individual Therapy  Charlane FerrettiMelissa Kenyata Guess, M.A., CCC-SLP [phone]  Freada Twersky 12/24/2016, 4:16 PM

## 2016-12-24 NOTE — Progress Notes (Signed)
Physical Therapy Note  Patient Details  Name: Jetty DuhamelJohn R Streat MRN: [id number] Date of Birth: 18-Nov-1930 Today's Date: 12/24/2016  1300-1330, 30 min individual tx Pain: no c/o  neuromuscular re-education via forced use for W/c propulsion using bil UEs ; alternating reciprocal marching in sitting/standing on Kinetron.L trunk shortening/lengthening limited and results in limited trunk righting with L biased standing on Kinetron.  Gait x with RW to return to room, min guard assist, max cues for forward gaze, and upright trunk. Min cues for route finding.  Pt left resting in w/c with quick release belt applied and all needs within reach.  See function navigator for current status.  Juanmiguel Defelice 12/24/2016, 1:10 PM

## 2016-12-24 NOTE — Progress Notes (Signed)
Occupational Therapy Session Note  Patient Details  Name: Alex DuhamelJohn R Fitzwater MRN: [id number] Date of Birth: 1931-07-01  Today's Date: 12/24/2016 OT Individual Time: [id number] OT Individual Time Calculation (min): 26 min    Short Term Goals: Week 1:  OT Short Term Goal 1 (Week 1): Pt will perform stand pivot transfer with mod A +1 to BSC/ toilet OT Short Term Goal 2 (Week 1): Pt will perform sit to stand for clothing management with  mod A  OT Short Term Goal 3 (Week 1): Pt will perform bed mobility in prep for ADL task with mod A with bed rails and extra time OT Short Term Goal 4 (Week 1): Pt will be oriented x4 with mod external cues  Skilled Therapeutic Interventions/Progress Updates:    Pt worked on functional mobility using the RW for support to walk from his room to the dayroom.  Min assist for initial sit to stand with mod instructional cueing to push up from the arm rests and to not pull up on the walker.  He tends to place his LUE on the walker and push up with only the right.  Mod instructional cueing for upright posture and to stay in side of the walker.  He was able to transfer to the end of the sofa and rest once in the dayroom.  Oxygen sats were 96% on room air with HR at 75.  Transferred from couch to the high low table for standing task, incorporating visual scanning to the left as well as LUE function coordination and reaching.  Pt with moderate difficulty using the LUE to pick up checkers and place in vertical grid.  He was successful on picking up approximately 50% without dropping them.  Maintained standing for 4-5 mins with min guard assist using the walker for support before sitting to rest.  Concluded session with therapist rolling pt back to the room and placing him at bedside in the chair with call button and phone in reach.  Safety belt also in place as well.    Therapy Documentation Precautions:  Precautions Precautions: Fall Precaution Comments: ataxic, poor safety  awareness Restrictions Weight Bearing Restrictions: No  Pain: Pain Assessment Pain Assessment: No/denies pain ADL: See Function Navigator for Current Functional Status.   Therapy/Group: Individual Therapy  Ramie Palladino OTR/L 12/24/2016, 12:32 PM

## 2016-12-24 NOTE — Progress Notes (Signed)
Occupational Therapy Session Note  Patient Details  Name: Alex DuhamelJohn R Chiappetta MRN: [id number] Date of Birth: [birth year]  Today's Date: 12/24/2016 OT Individual Time: 0930-1030 OT Individual Time Calculation (min): 60 min    Short Term Goals: Week 1:  OT Short Term Goal 1 (Week 1): Pt will perform stand pivot transfer with mod A +1 to BSC/ toilet OT Short Term Goal 2 (Week 1): Pt will perform sit to stand for clothing management with  mod A  OT Short Term Goal 3 (Week 1): Pt will perform bed mobility in prep for ADL task with mod A with bed rails and extra time OT Short Term Goal 4 (Week 1): Pt will be oriented x4 with mod external cues  Skilled Therapeutic Interventions/Progress Updates:    Pt engaged in BADL retraining including bathing/dressing with sit<>stand from w/c at sink.  Pt amb with RW to w/c at sink with steady A.  Pt fatigues quickly and required rest breaks between each segment of bathing/dressing tasks.  Pt performs sit<>stand with supervision and remains standing at sink at supervision level.  Pt required assistance with donning underpants and pants secondary to continued limited coordination of LUE.  Pt initiates use of LUE but unable to adequately grasp and frequently misses target unless gazing at object/clothing.  Pt remained in w/c with QRB in place and wife present. Focus on activity tolerance, sit<>stand, standing balance, functional amb with RW, and safety awareness to increase independence with BADLs.   Therapy Documentation Precautions:  Precautions Precautions: Fall Precaution Comments: ataxic, poor safety awareness Restrictions Weight Bearing Restrictions: No  Pain:  Pt denies pain  See Function Navigator for Current Functional Status.   Therapy/Group: Individual Therapy  Rich BraveLanier, Gaby Harney Chappell 12/24/2016, 11:16 AM

## 2016-12-25 ENCOUNTER — Inpatient Hospital Stay (HOSPITAL_COMMUNITY): Payer: Medicare Other

## 2016-12-25 ENCOUNTER — Inpatient Hospital Stay (HOSPITAL_COMMUNITY): Payer: Medicare Other | Admitting: Speech Pathology

## 2016-12-25 DIAGNOSIS — A499 Bacterial infection, unspecified: Secondary | ICD-10-CM | POA: Insufficient documentation

## 2016-12-25 DIAGNOSIS — N390 Urinary tract infection, site not specified: Secondary | ICD-10-CM | POA: Insufficient documentation

## 2016-12-25 DIAGNOSIS — R8290 Unspecified abnormal findings in urine: Secondary | ICD-10-CM

## 2016-12-25 LAB — URINE CULTURE

## 2016-12-25 MED ORDER — NITROFURANTOIN MONOHYD MACRO 100 MG PO CAPS
100.0000 mg | ORAL_CAPSULE | Freq: Two times a day (BID) | ORAL | Status: DC
  Administered 2016-12-25 – 2016-12-29 (×9): 100 mg via ORAL
  Filled 2016-12-25 (×9): qty 1

## 2016-12-25 MED ORDER — LIDOCAINE HCL 2 % EX GEL: 1.0000 "application " | CUTANEOUS | Status: DC

## 2016-12-25 MED ORDER — LIDOCAINE HCL 2 % EX GEL
1.0000 "application " | Freq: Once | CUTANEOUS | Status: DC
  Filled 2016-12-25 (×2): qty 5

## 2016-12-25 NOTE — Progress Notes (Signed)
Occupational Therapy Session Note  Patient Details  Name: Jetty DuhamelJohn R Hunsucker MRN: [id number] Date of Birth: 1931/01/15  Today's Date: 12/25/2016 OT Individual Time: [id number] OT Individual Time Calculation (min): 84 min    Short Term Goals: Week 1:  OT Short Term Goal 1 (Week 1): Pt will perform stand pivot transfer with mod A +1 to BSC/ toilet OT Short Term Goal 2 (Week 1): Pt will perform sit to stand for clothing management with  mod A  OT Short Term Goal 3 (Week 1): Pt will perform bed mobility in prep for ADL task with mod A with bed rails and extra time OT Short Term Goal 4 (Week 1): Pt will be oriented x4 with mod external cues  Skilled Therapeutic Interventions/Progress Updates:    Pt engaged in BADL retraining including bathing at shower level, toileting, and dressing with sit<>stand from w/c level.  Pt amb with RW to/from bathroom for toileting and bathing.  Pt requires more than a reasonable amount of time to complete tasks and requires multiple rest breaks.  Pt fatigues easily but performs sit<>stand with close supervision.  Pt requires assistance with bathing BLE and don socks this morning secondary to fatigue.  Pt returned to bed with all needs within reach and wife present.  Focus on activity tolerance, sit<>stand, standing balance, funcitonal amb with RW, and safety awareness to increase independence with ABDLs.   Therapy Documentation Precautions:  Precautions Precautions: Fall Precaution Comments: ataxic, poor safety awareness Restrictions Weight Bearing Restrictions: No   Pain:  Pt denies pain  See Function Navigator for Current Functional Status.   Therapy/Group: Individual Therapy  Rich BraveLanier, Khai Arrona Chappell 12/25/2016, 11:27 AM

## 2016-12-25 NOTE — Progress Notes (Signed)
Speech Language Pathology Daily Session Note  Patient Details  Name: Alex DuhamelJohn R Orne MRN: [id number] Date of Birth: 09/18/1931  Today's Date: 12/25/2016 SLP Individual Time: 0730-0830 SLP Individual Time Calculation (min): 60 min  Short Term Goals: Week 1: SLP Short Term Goal 1 (Week 1): Patient will consume current diet with minimal overt s/s of aspiration with Min A verbal cues.  SLP Short Term Goal 2 (Week 1): Pt will consumed trials of dysphagia 2 with minimal overt s/s of aspiration and Min A verbal cues for complete oral clearing.  SLP Short Term Goal 3 (Week 1): Pt will sustained attention for ~5 minutes with Mod A verbal cues for redirection to tasks. SLP Short Term Goal 4 (Week 1): Pt will answer orientation questions with Max A multimodal cues.  SLP Short Term Goal 5 (Week 1): Pt will follow 1 step functional basic directions to complete ADL task with Min A cues.  SLP Short Term Goal 6 (Week 1): Pt will demonstrate functional problem solving of basic, famaliar tasks with Mod A verbal cues.   Skilled Therapeutic Interventions: Skilled treatment session focused on cognitive and dysphagia goals. SLP facilitated session by providing Min A verbal cues for use of swallowing compensatory strategies with breakfast meal of Dys. 1 textures with thin liquids. Patient also consumed trials of Dys. 2 textures with efficient mastication and without overt s/s of aspiration. However, during trials of Dys. 3 textures, patient with overt cough X 1, suspect due to dry and crumbly texture. Recommend patient upgrade to Dys. 2 textures. Patient demonstrated sustained attention to tasks with Mod I and required Min A verbal cues for problem solving while donning dentures. Patient left supine in bed with all needs within reach. Continue with current plan of care.      Function:  Eating Eating   Modified Consistency Diet: Yes Eating Assist Level: Set up assist for;Supervision or verbal cues;Helper checks for  pocketed food   Eating Set Up Assist For: Opening containers;Applying device (includes dentures)       Cognition Comprehension Comprehension assist level: Understands basic 75 - 89% of the time/ requires cueing 10 - 24% of the time  Expression   Expression assist level: Expresses basic 75 - 89% of the time/requires cueing 10 - 24% of the time. Needs helper to occlude trach/needs to repeat words.  Social Interaction    Problem Solving Problem solving assist level: Solves basic 50 - 74% of the time/requires cueing 25 - 49% of the time  Memory Memory assist level: Recognizes or recalls 25 - 49% of the time/requires cueing 50 - 75% of the time    Pain No/Denies Pain   Therapy/Group: Individual Therapy  Vaida Kerchner 12/25/2016, 3:27 PM

## 2016-12-25 NOTE — Progress Notes (Signed)
Physical Therapy Session Note  Patient Details  Name: Alex [name] MRN: [id number] Date of Birth: 12/09/1930  Today's Date: 12/24/2016 PT Individual Time: 1630-1710   PT Individual Time Calculation: 40 min   Short Term Goals: Week 1:  PT Short Term Goal 1 (Week 1): pt will transfer consistently with min assist and LRAD PT Short Term Goal 2 (Week 1): Pt will ambulate 72' with LRAD and min assist PT Short Term Goal 3 (Week 1): pt will demonstrate intellectual awareness with min cues in 25% of opportunities  Skilled Therapeutic Interventions/Progress Updates:   Pt received sitting in WC and agreeable to PT  PT instructed patient in gait training for 2 bouts of 154f with intermittent min-supervision assist from PT with occasional cues to improved trunk elongation and improved posture as well as AD management on floor transitions.     Patient returned to room and instructed by PT in bed mobility to supine with supervision assist from PT for safety and min for LE management. Once in bed, pt reports that he needs to use urinal, PT PT assists patient to standing position with supervision assist and min cues for safety and use of AD. Patient successfully voids bladder in standing position EOB, but is also noted to have incontinent Bowel movement. Standing balance instructed by PT with intermittent 1-2 UE support for 2 bouts of 5 minutes to clean bowel movement and don/doff pants and underwear.  Patient returned to bed with bed alarm set, call bell in reach, and all needs met.    Therapy Documentation Precautions:  Precautions Precautions: Fall Precaution Comments: ataxic, poor safety awareness Restrictions Weight Bearing Restrictions: No Vital Signs: Therapy Vitals Temp: 97.9 F (36.6 C) Temp Source: Oral Pulse Rate: 61 Resp: 18 BP: 119/60 Patient Position (if appropriate): Lying Oxygen Therapy SpO2: 97 % O2 Device: Not Delivered  See Function Navigator for Current Functional  Status.   Therapy/Group: Individual Therapy  ALorie Phenix3/10/2016, 6:23 AM

## 2016-12-25 NOTE — Progress Notes (Addendum)
Physical Therapy Note  Patient Details  Name: Alex DuhamelJohn R [name] MRN: [id number] Date of Birth: 14-Sep-1931 Today's Date: 12/25/2016  1430-1530, 60 min individual tx Pain: no c/o  Supine > sit with supervision, HOB raised, no rail (per home bed).  Pt sat bedside x several minutes for rest.  Stand pivot with RW bed> w/c to L.  Pants noted to be wet.  Pt had had bowel accident and was unaware. Toilet transfer for further large brown BM and voiding.  Total assist for hygiene and removing clothing. Pt attempted to clean himself in standing, but dropped into a deep squat with 1 hand on rail,  but needed total assist to stand back up.  Max assist for donning clean pants. Pt washed hands at sink with cues for location of items.  neuromuscular re-education via forced use for bil UE use to propel w/c x 50' . Gait with RW x 40' including turns, with cues for keeping bil feet within RW.  Pt requested getting back to bed. Pt left resting in bed with alarm set and all needs within reach.   Atthew Coutant 12/25/2016, 3:25 PM

## 2016-12-25 NOTE — Progress Notes (Signed)
[place] PHYSICAL MEDICINE & REHABILITATION     PROGRESS NOTE  Subjective/Complaints:  Pt seen laying in bed this AM about to try to use the urinal.  He slept well overnight.  He jokes about wanting morphine.   ROS: Denies CP, SOB, N/V/D.  Objective: Vital Signs: Blood pressure 119/60, pulse 61, temperature 97.9 F (36.6 C), temperature source Oral, resp. rate 18, weight 103.6 kg (228 lb 6.3 oz), SpO2 97 %. No results found. No results for input(s): WBC, HGB, HCT, PLT in the last 72 hours. No results for input(s): NA, K, CL, GLUCOSE, BUN, CREATININE, CALCIUM in the last 72 hours.  Invalid input(s): CO CBG (last 3)  No results for input(s): GLUCAP in the last 72 hours.  Wt Readings from Last 3 Encounters:  12/25/16 103.6 kg (228 lb 6.3 oz)  12/16/16 103.2 kg (227 lb 8.2 oz)  12/15/16 105.2 kg (232 lb)    Physical Exam:  BP 119/60 (BP Location: Right Arm)   Pulse 61   Temp 97.9 F (36.6 C) (Oral)   Resp 18   Wt 103.6 kg (228 lb 6.3 oz)   SpO2 97%   BMI 29.32 kg/m  Constitutional: No distress. Well-developed. Vital signs reviewed.  HENT: Dressings in place.  Eyes: EOMI. No discharge.  Cardiovascular: Murmurheard. IRRR. No JVD. Respiratory: Effort normal. Clear.  GI: Soft. Bowel sounds are normal.  Neurological.Alert and oriented x2.  Motor: B/l UE 4/5 prox to distal.  B/l LE: 4+/5 proximal to distal (unchanged) Skin: Warm and dry.  Psych: pleasant and cooperative.    Assessment/Plan: 1. Functional deficits secondary to SDH sp craniotomy which require 3+ hours per day of interdisciplinary therapy in a comprehensive inpatient rehab setting. Physiatrist is providing close team supervision and 24 hour management of active medical problems listed below. Physiatrist and rehab team continue to assess barriers to discharge/monitor patient progress toward functional and medical goals.  Function:  Bathing Bathing position Bathing activity did not occur:  Refused Position: Systems developer parts bathed by patient: Right arm, Left arm, Chest, Abdomen, Front perineal area, Buttocks, Right upper leg, Left upper leg Body parts bathed by helper: Right lower leg, Left lower leg, Back  Bathing assist Assist Level: Touching or steadying assistance(Pt > 75%)      Upper Body Dressing/Undressing Upper body dressing Upper body dressing/undressing activity did not occur: Refused What is the patient wearing?: Pull over shirt/dress     Pull over shirt/dress - Perfomed by patient: Thread/unthread right sleeve, Pull shirt over trunk, Put head through opening, Thread/unthread left sleeve Pull over shirt/dress - Perfomed by helper: Thread/unthread left sleeve        Upper body assist Assist Level: Supervision or verbal cues      Lower Body Dressing/Undressing Lower body dressing   What is the patient wearing?: Underwear, Pants Underwear - Performed by patient: Thread/unthread left underwear leg, Pull underwear up/down Underwear - Performed by helper: Thread/unthread right underwear leg, Thread/unthread left underwear leg, Pull underwear up/down Pants- Performed by patient: Thread/unthread right pants leg, Pull pants up/down Pants- Performed by helper: Thread/unthread right pants leg, Thread/unthread left pants leg, Pull pants up/down   Non-skid slipper socks- Performed by helper: Don/doff right sock, Don/doff left sock   Socks - Performed by helper: Don/doff right sock              Lower body assist Assist for lower body dressing: Touching or steadying assistance (Pt > 75%)      Toileting Toileting  Toileting activity did not occur: No continent bowel/bladder event Toileting steps completed by patient: Performs perineal hygiene Toileting steps completed by helper: Adjust clothing prior to toileting, Adjust clothing after toileting Toileting Assistive Devices: Grab bar or rail  Toileting assist Assist level: Two helpers    Transfers Chair/bed transfer   Chair/bed transfer method: Stand pivot Chair/bed transfer assist level: Touching or steadying assistance (Pt > 75%) Chair/bed transfer assistive device: Patent attorneyWalker     Locomotion Ambulation     Max distance: 60 Assist level: Touching or steadying assistance (Pt > 75%)   Wheelchair   Type: Manual Max wheelchair distance: 70 Assist Level: Supervision or verbal cues  Cognition Comprehension Comprehension assist level: Understands basic 75 - 89% of the time/ requires cueing 10 - 24% of the time  Expression Expression assist level: Expresses basic 75 - 89% of the time/requires cueing 10 - 24% of the time. Needs helper to occlude trach/needs to repeat words.  Social Interaction Social Interaction assist level: Interacts appropriately 75 - 89% of the time - Needs redirection for appropriate language or to initiate interaction.  Problem Solving Problem solving assist level: Solves basic 50 - 74% of the time/requires cueing 25 - 49% of the time  Memory Memory assist level: Recognizes or recalls 25 - 49% of the time/requires cueing 50 - 75% of the time    Medical Problem List and Plan: 1. 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 DVT Prophylaxis/Anticoagulation: SCDs. Monitor for any signs of DVT 3. Pain Management: Hydrocodone as needed 4. Mood: Xanax 0.25 mg daily at bedtime,Lexapro 10 mg daily 5. Neuropsych: This patient is notcapable of making decisions on hisown behalf. 6. Skin/Wound Care: Routine skin checks 7. Fluids/Electrolytes/Nutrition: Routine I&Os 8.Seizure prophylaxis. Keppra 500 mg every 12 hours 9.Atrial fibrillation/AVR/CABG/PPM. Continue low-dose aspirin. Amiodarone 200 mg twice a day, Toprol 12.5 mg twice a day. Follow-up per cardiology services 10.Dysphagia. Dysphagia #1 thin liquid diet advanced to D2  thin. Follow-up speech therapy 11.Acute diastolic congestive heart failure. Lasix 20 mg daily. Monitor for any fluid overload 12.Hypokalemia.   K+ 3.8 on 2/26  Cont to monitor 13.History of COPD. Check oxygen saturations every shift 14.History of alcohol abuse. Monitor for withdrawal. Provide counseling as appropriate 15.Constipation. Laxative assistance 16. Transaminitis:   Elevated 2/26  Atorvastatin decreased to 40 on 2/26  Cont to monitor 17. Leukocytosis  Afebrile  WBCs 11.0 on 2/26  Will cont to monitor 18. Macrocytic anemia  Hb 10.7 on 2/26  Cont Vit supplementation 19. HTN  Lose dose Lisinopril started 2/28  Improving  Cont to monitor 20. Urinary retention  Bethanechol increased to 25 on 2/28  Improving  UA?+, await Ucx  LOS (Days) 6 A FACE TO FACE EVALUATION WAS PERFORMED  Ankit Karis Jubanil Patel 12/25/2016 11:47 AM

## 2016-12-25 NOTE — Progress Notes (Addendum)
Physical Therapy Note  Patient Details  Name: Alex [name] MRN: [id number] Date of Birth: 1930/11/26 Today's Date: 12/25/2016  0905-1000, 55 min individual tx Pain:no c/o  Pt asleep in bed but easily aroused.    neuromuscular re-education via multimodal cues for 10 x 1 each:  R/L hip abduction in side lying, 10 x 2 bil bridging,  10 x 1 R/L isolated hip extension in side lying, 10 x 1 R/L straight leg raises, prolonged stretch R/L obliques via lower trunk rotation in supine.  Supine> sit with cues for technique; R side lying> sit with extra time, min assist, tactile cues, max encouragement to use RUE and push.  Pt stated he was really tired.  Gait with RW on level tile with min guard assist and max cues for upright trunk, forward gaze and keeping LEs close to RW. Pt left resting in w/c with wife present and all needs within reach.  See function navigator for current status.   Julionna Marczak 12/25/2016, 7:54 AM

## 2016-12-26 ENCOUNTER — Inpatient Hospital Stay (HOSPITAL_COMMUNITY): Payer: Medicare Other | Admitting: Occupational Therapy

## 2016-12-26 ENCOUNTER — Inpatient Hospital Stay (HOSPITAL_COMMUNITY): Payer: Medicare Other | Admitting: Physical Therapy

## 2016-12-26 ENCOUNTER — Inpatient Hospital Stay (HOSPITAL_COMMUNITY): Payer: Medicare Other

## 2016-12-26 ENCOUNTER — Inpatient Hospital Stay (HOSPITAL_COMMUNITY): Payer: Medicare Other | Admitting: Speech Pathology

## 2016-12-26 DIAGNOSIS — R609 Edema, unspecified: Secondary | ICD-10-CM

## 2016-12-26 DIAGNOSIS — I4891 Unspecified atrial fibrillation: Secondary | ICD-10-CM

## 2016-12-26 DIAGNOSIS — R8299 Other abnormal findings in urine: Secondary | ICD-10-CM

## 2016-12-26 DIAGNOSIS — S069X9S Unspecified intracranial injury with loss of consciousness of unspecified duration, sequela: Secondary | ICD-10-CM

## 2016-12-26 MED ORDER — POTASSIUM CHLORIDE CRYS ER 20 MEQ PO TBCR
20.0000 meq | EXTENDED_RELEASE_TABLET | Freq: Every day | ORAL | Status: DC
  Administered 2016-12-27 – 2017-01-10 (×15): 20 meq via ORAL
  Filled 2016-12-26 (×15): qty 1

## 2016-12-26 MED ORDER — POTASSIUM CHLORIDE 20 MEQ/15ML (10%) PO SOLN: 20.0000 meq | Freq: Every day | ORAL | Status: DC

## 2016-12-26 MED ORDER — FUROSEMIDE 40 MG PO TABS
40.0000 mg | ORAL_TABLET | Freq: Every day | ORAL | Status: DC
  Administered 2016-12-27 – 2016-12-30 (×4): 40 mg via ORAL
  Filled 2016-12-26 (×4): qty 1

## 2016-12-26 NOTE — Progress Notes (Addendum)
[place] PHYSICAL MEDICINE & REHABILITATION     PROGRESS NOTE  Subjective/Complaints:  Pt seen sitting up in his chair this AM.  He slept well overnight.  Per SLP, pt slightly more confused this AM.    ROS: Denies CP, SOB, N/V/D.  Objective: Vital Signs: Blood pressure 106/72, pulse 68, temperature 98.6 F (37 C), temperature source Oral, resp. rate 17, weight 103.5 kg (228 lb 2.8 oz), SpO2 97 %. No results found. No results for input(s): WBC, HGB, HCT, PLT in the last 72 hours. No results for input(s): NA, K, CL, GLUCOSE, BUN, CREATININE, CALCIUM in the last 72 hours.  Invalid input(s): CO CBG (last 3)  No results for input(s): GLUCAP in the last 72 hours.  Wt Readings from Last 3 Encounters:  12/26/16 103.5 kg (228 lb 2.8 oz)  12/16/16 103.2 kg (227 lb 8.2 oz)  12/15/16 105.2 kg (232 lb)    Physical Exam:  BP 106/72 (BP Location: Right Arm)   Pulse 68   Temp 98.6 F (37 C) (Oral)   Resp 17   Wt 103.5 kg (228 lb 2.8 oz)   SpO2 97%   BMI 29.30 kg/m  Constitutional: No distress. Well-developed. Vital signs reviewed.  HENT: Dressings in place.  Eyes: EOMI. No discharge.  Cardiovascular: Murmurheard. IRRR. No JVD. Respiratory: Effort normal. Clear.  GI: Soft. Bowel sounds are normal.  Musc: No tenderness. LE edema. Neurological.Alert and oriented x2 (difficulty with date of month).  Motor: B/l UE 4/5 prox to distal.  B/l LE: 4+/5 proximal to distal (stable). Skin: Warm and dry.  Psych: pleasant and cooperative.    Assessment/Plan: 1. Functional deficits secondary to SDH sp craniotomy which require 3+ hours per day of interdisciplinary therapy in a comprehensive inpatient rehab setting. Physiatrist is providing close team supervision and 24 hour management of active medical problems listed below. Physiatrist and rehab team continue to assess barriers to discharge/monitor patient progress toward functional and medical goals.  Function:  Bathing Bathing  position Bathing activity did not occur: Refused Position: Systems developer parts bathed by patient: Right arm, Left arm, Chest, Abdomen, Front perineal area, Buttocks, Right upper leg, Left upper leg Body parts bathed by helper: Right lower leg, Left lower leg, Back  Bathing assist Assist Level: Touching or steadying assistance(Pt > 75%)      Upper Body Dressing/Undressing Upper body dressing Upper body dressing/undressing activity did not occur: Refused What is the patient wearing?: Pull over shirt/dress     Pull over shirt/dress - Perfomed by patient: Thread/unthread right sleeve, Pull shirt over trunk, Put head through opening, Thread/unthread left sleeve Pull over shirt/dress - Perfomed by helper: Thread/unthread left sleeve        Upper body assist Assist Level: Supervision or verbal cues      Lower Body Dressing/Undressing Lower body dressing   What is the patient wearing?: Underwear, Pants Underwear - Performed by patient: Thread/unthread left underwear leg, Pull underwear up/down Underwear - Performed by helper: Thread/unthread right underwear leg, Thread/unthread left underwear leg, Pull underwear up/down Pants- Performed by patient: Thread/unthread right pants leg, Pull pants up/down Pants- Performed by helper: Thread/unthread right pants leg, Thread/unthread left pants leg, Pull pants up/down   Non-skid slipper socks- Performed by helper: Don/doff right sock, Don/doff left sock   Socks - Performed by helper: Don/doff right sock              Lower body assist Assist for lower body dressing: Touching or steadying assistance (Pt >  75%)      Toileting Toileting Toileting activity did not occur: No continent bowel/bladder event Toileting steps completed by patient: Performs perineal hygiene Toileting steps completed by helper: Adjust clothing prior to toileting, Performs perineal hygiene, Adjust clothing after toileting Toileting Assistive Devices: Grab  bar or rail  Toileting assist Assist level: Touching or steadying assistance (Pt.75%)   Transfers Chair/bed transfer   Chair/bed transfer method: Ambulatory Chair/bed transfer assist level: Touching or steadying assistance (Pt > 75%) Chair/bed transfer assistive device: Patent attorneyWalker     Locomotion Ambulation     Max distance: 40 Assist level: Touching or steadying assistance (Pt > 75%)   Wheelchair   Type: Manual Max wheelchair distance: 70 Assist Level: Supervision or verbal cues  Cognition Comprehension Comprehension assist level: Understands basic 75 - 89% of the time/ requires cueing 10 - 24% of the time  Expression Expression assist level: Expresses basic 75 - 89% of the time/requires cueing 10 - 24% of the time. Needs helper to occlude trach/needs to repeat words.  Social Interaction Social Interaction assist level: Interacts appropriately 75 - 89% of the time - Needs redirection for appropriate language or to initiate interaction.  Problem Solving Problem solving assist level: Solves basic 50 - 74% of the time/requires cueing 25 - 49% of the time  Memory Memory assist level: Recognizes or recalls 25 - 49% of the time/requires cueing 50 - 75% of the time    Medical Problem List and Plan: 1. 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 DVT Prophylaxis/Anticoagulation: SCDs. Monitor for any signs of DVT 3. Pain Management: Hydrocodone as needed 4. Mood: Xanax 0.25 mg daily at bedtime,Lexapro 10 mg daily 5. Neuropsych: This patient is notcapable of making decisions on hisown behalf. 6. Skin/Wound Care: Routine skin checks 7. Fluids/Electrolytes/Nutrition: Routine I&Os 8.Seizure prophylaxis. Keppra 500 mg every 12 hours 9.Atrial fibrillation/AVR/CABG/PPM. Continue low-dose aspirin. Amiodarone 200 mg twice a day, Toprol 12.5 mg twice a day.  Follow-up per cardiology services 10.Dysphagia. Dysphagia #1 thin liquid diet advanced to D2 thin. Follow-up speech therapy 11.Acute diastolic congestive heart failure. Lasix 20 mg daily. Monitor for any fluid overload 12.Hypokalemia.   K+ 3.8 on 2/26  Supplement increased, while on lasix  Cont to monitor 13.History of COPD. Check oxygen saturations every shift 14.History of alcohol abuse. Monitor for withdrawal. Provide counseling as appropriate 15.Constipation. Laxative assistance 16. Transaminitis:   Elevated 2/26  Atorvastatin decreased to 40 on 2/26  Will order labs for Monday  Cont to monitor 17. Leukocytosis  Afebrile  WBCs 11.0 on 2/26  Will order labs for Monday  Will cont to monitor 18. Macrocytic anemia  Hb 10.7 on 2/26  Will order labs for Monday  Cont Vit supplementation 19. HTN  Low dose Lisinopril started 2/28  Improving 3/2  Cont to monitor 20. Urinary retention  Bethanechol 25, monitor for side effects  Improving  UA?+, Ucx multiple species, resent 21. LE edema  Lasix increased 3/2  LOS (Days) 7 A FACE TO FACE EVALUATION WAS PERFORMED  Tejas Seawood Karis Jubanil Nielle Duford 12/26/2016 9:31 AM

## 2016-12-26 NOTE — Progress Notes (Signed)
Physical Therapy Session Note  Patient Details  Name: Alex [name] MRN: [id number] Date of Birth: 03/29/1931  Today's Date: 12/26/2016 PT Individual Time: 0905-1000 PT Individual Time Calculation (min): 55 min   Short Term Goals: Week 1:  PT Short Term Goal 1 (Week 1): pt will transfer consistently with min assist and LRAD PT Short Term Goal 2 (Week 1): Pt will ambulate 66' with LRAD and min assist PT Short Term Goal 3 (Week 1): pt will demonstrate intellectual awareness with min cues in 25% of opportunities  Skilled Therapeutic Interventions/Progress Updates:   Pt received sitting in WC and agreeable to PT. RN present for medication administration at start of session.  PT instructed patient in donning pants and shirt with moderate cues for problem solving orientation of clothing As well as min assist to thread extremities through proper sleeve and pant leg. Supervision assist from PT also provided with standing balance to pull pants to waist.   Throughout treatment, patient performed sit<>stand transfers from various heights from 19in to 22in with supervision assist from PT. Only min cues for safety and UE placement required by PT.   Gait training through  Rehab gym 2x 67f to locate 3 colored cones with RW. Pt able to retain tagets colors 80% of the time, and needed min cues for improved scanning to L to locate targets above eye level.   Nustep endurance and strength training 2x 5 minutes level 3>4. Pt Borg RPE 14 at end of second bout.   [facility] too [facility] and all needs met.            Therapy Documentation Precautions:  Precautions Precautions: Fall Precaution Comments: ataxic, poor safety awareness Restrictions Weight Bearing Restrictions: No Pain: 0/10   See Function Navigator for Current Functional Status.   Therapy/Group: Individual Therapy  ALorie Phenix3/11/2016, 10:00 AM

## 2016-12-26 NOTE — Progress Notes (Signed)
Occupational Therapy Session Note  Patient Details  Name: Alex DuhamelJohn R Hasley MRN: [id number] Date of Birth: 06/24/1931  Today's Date: 12/26/2016 OT Individual Time: 1000-1030 OT Individual Time Calculation (min): 30 min    Short Term Goals: Week 1:  OT Short Term Goal 1 (Week 1): Pt will perform stand pivot transfer with mod A +1 to BSC/ toilet OT Short Term Goal 2 (Week 1): Pt will perform sit to stand for clothing management with  mod A  OT Short Term Goal 3 (Week 1): Pt will perform bed mobility in prep for ADL task with mod A with bed rails and extra time OT Short Term Goal 4 (Week 1): Pt will be oriented x4 with mod external cues  Skilled Therapeutic Interventions/Progress Updates: ADL-retraining with focus on grooming (sitting and standing), sit<>stand, activity tolerance and orientation.  Pt received in his room from P.T. with wife present.  Pt requested assist with grooming (shaving his beard) as his wife would not assist d/t fear of failure.   Pt rose from w/c to stand at sink and shaved 50% of the right side of his face using his electric razor with guard set to level 5.   OT educated spouse on use of personal shaver to assist in the future.   Pt then maintained static standing balance for another 3 minutes to allow OT to remove heavy beard on the left side of his face.   Pt fatigued and requested to sit for remainder of session.   OT completed fine grooming as pt responded well to vc this session for turning his head and positioning his lips/mouth as needed.   Pt also reported voiding urine in his brief and he stood again at sink, min assist, as OT managed brief and clothing.   Pt recovered to w/c and remained seated at end of session with wife present to attend.      Therapy Documentation Precautions:  Precautions Precautions: Fall Precaution Comments: ataxic, poor safety awareness Restrictions Weight Bearing Restrictions: No  Pain: Pain Assessment Pain Assessment: No/denies  pain Pain Score: 0-No pain  See Function Navigator for Current Functional Status.   Therapy/Group: Individual Therapy  Basil Blakesley 12/26/2016, 12:33 PM

## 2016-12-26 NOTE — Progress Notes (Signed)
Occupational Therapy Session Note  Patient Details  Name: Alex DuhamelJohn R Puskarich MRN: [id number] Date of Birth: May 21, 1931  Today's Date: 12/26/2016 OT Individual Time: [id number] OT Individual Time Calculation (min): 45 min    Short Term Goals: Week 1:  OT Short Term Goal 1 (Week 1): Pt will perform stand pivot transfer with mod A +1 to BSC/ toilet OT Short Term Goal 2 (Week 1): Pt will perform sit to stand for clothing management with  mod A  OT Short Term Goal 3 (Week 1): Pt will perform bed mobility in prep for ADL task with mod A with bed rails and extra time OT Short Term Goal 4 (Week 1): Pt will be oriented x4 with mod external cues  Skilled Therapeutic Interventions/Progress Updates:    Treatment session with focus on activity tolerance and overall endurance.  Pt received upright in w/c reporting fatigue, requiring increased encouragement to participate at seated level.  Pt reports need to toilet.  Attempted ambulation into bathroom with RW, however pt unable to maintain grasp on RW with LUE and requesting to complete from w/c level.  Performed stand pivot with min guard.  Pt completed hygiene with lateral leans and no assistance.  Min assist for sit > stand from low surface and assist to don clean brief.  Completed hand hygiene with use of Rt hand to turn on hot water as unable to securely grasp handle with Lt.  Engaged in 2 sets of 10 over head and chest presses with 5# dowel.  Pt reports fatigue, requesting to return to bed.  Completed stand pivot transfer with close supervision.  Therapy Documentation Precautions:  Precautions Precautions: Fall Precaution Comments: ataxic, poor safety awareness Restrictions Weight Bearing Restrictions: No Pain: Pain Assessment Pain Assessment: No/denies pain  See Function Navigator for Current Functional Status.   Therapy/Group: Individual Therapy  [name], [name] 12/26/2016, 3:01 PM

## 2016-12-26 NOTE — Patient Care Conference (Signed)
Inpatient RehabilitationTeam Conference and Plan of Care Update Date: 12/23/2016   Time: 11:55 AM    Patient Name: Alex [name]      Medical Record Number: [id number]  Date of Birth: 1931-01-18 Sex: Male         Room/Bed: 4W13C/4W13C-01 Payor Info: Payor: [employer]vertising copy[employer] MEDICARE / Plan: [facility] MEDICARE / Product Type: *No Product type* /    Admitting Diagnosis: SDH  Admit Date/Time:  12/19/2016  6:18 PM Admission Comments: No comment available   Primary Diagnosis:  Traumatic subdural hematoma (HCC) Principal Problem: Traumatic subdural hematoma [facility])  Patient Active Problem List   Diagnosis Date Noted  . Peripheral edema   . Abnormal urinalysis   . Urinary retention   . Hypertension   . Reactive hypertension   . Transaminitis   . Traumatic subdural hematoma (HCC) 12/19/2016  . Acute pulmonary edema (HCC) 12/12/2016  . Acute diastolic CHF (congestive heart failure) (HCC) 12/12/2016  . Atrial flutter (HCC) 12/12/2016  . Traumatic subdural bleed with LOC of 1 hour to 5 hours 59 minutes (HCC) 12/11/2016  . Class 1 obesity due to excess calories with body mass index (BMI) of 30.0 to 30.9 in adult   . ETOH abuse   . Chronic obstructive pulmonary disease (HCC)   . Supplemental oxygen dependent   . Alcohol use   . OSA on CPAP   . Atrial fibrillation (HCC)   . Coronary artery disease involving coronary bypass graft of native heart without angina pectoris   . Seizures (HCC)   . Dysphagia   . Bradycardia   . Hyperglycemia   . Agitation   . Hypokalemia   . Hypernatremia   . Leukocytosis   . Macrocytic anemia   . Thrombocytopenia (HCC)   . Traumatic subdural hematoma without loss of consciousness (HCC)   . Change in mental status   . Subdural hematoma (HCC) 12/03/2016  . Sick sinus syndrome (HCC) 11/06/2015  . PAF (paroxysmal atrial fibrillation) (HCC) 11/14/2014  . SOB (shortness of breath) 10/31/2014  . Persistent atrial fibrillation (HCC) 10/31/2014  . Aortic stenosis,  severe 07/28/2011  . 3-vessel coronary artery disease 07/28/2011  . Hyperlipidemia, mixed 07/28/2011    Expected Discharge Date: Expected Discharge Date: 01/10/17  Team Members Present: Physician leading conference: Dr. Maryla Morrow Social Worker Present: Staci Acosta, LCSW Nurse Present: Other (comment) Clydie Braun Liliane Shi, RN) PT Present: Other (comment);Aleda Grana, PT (Judieth Keens, PT) OT Present: Ardis Rowan, COTA;Jennifer Katrinka Blazing, OT SLP Present: Other (comment) Reuel Derby, SLP) PPS Coordinator present : Tora Duck, RN, CRRN     Current Status/Progress Goal Weekly Team Focus  Medical   Decreased functional mobility with altered mental status secondary to traumatic subdural hematoma status post right frontotemporal parietal craniotomy 12/16/2016  Improve mobility, safety, bladder function, transaminitis, dysphagia  See above   Bowel/Bladder   I/o cath q  8 hours/ LBM 12/17/16 incontinent of bowel  mod assist   monitor q shift   Swallow/Nutrition/ Hydration   Dys. 1 textures with thin liquids, Mod A for use of strategies   Min A with least restrictive diet  increased use of strategies, trials of upgraded textures    ADL's   bathing-mod A; UB dressing-mod A; LB dressing-max A; functional transfers-min A; toileting-max A; decreased activity tolerance; LUE weakness and decreased FM and gross motor   supervision overall  activity tolerance; BADL retraining, functional transfers, standing balance, cognitive remediation, family education; LUE NMR   Mobility  Communication             Safety/Cognition/ Behavioral Observations  Mod-Max A  Min A  attention, problem solving, memory    Pain   no complaint of pain  keep pain less than or equal to 2  monitor pain q shift   Skin   generalized edema / left upper extremity weeping. needs a dressing at HS/moderate amount of fluid/ saturates bed and sheets/pad  no new skin breakdown this admission  monitor skin q shift     Rehab Goals Patient on target to meet rehab goals: Yes *See Care Plan and progress notes for long and short-term goals.  Barriers to Discharge: Mobility, safety, urinary retention, transaminitis, dysphagia    Possible Resolutions to Barriers:  Therapies, bladder meds, statin decreased, follow labs    Discharge Planning/Teaching Needs:  Home with spouse who can provide supervision/  teaching to be planned   Team Discussion:  Monitor labs due to LFTs;  I/O caths with high volumes - ?medication to assist - MD to consider.  Min assist currently and much better with this CIR stay.  Min-mod assist with ADLs and easily fatigues.  Fine-motor coordination poor esp on left.  On D2 trials.  Attention, problem solving and orientation are all focus areas.  Supervision goals overall.  Revisions to Treatment Plan:  None   Continued Need for Acute Rehabilitation Level of Care: The patient requires daily medical management by a physician with specialized training in physical medicine and rehabilitation for the following conditions: Daily direction of a multidisciplinary physical rehabilitation program to ensure safe treatment while eliciting the highest outcome that is of practical value to the patient.: Yes Daily medical management of patient stability for increased activity during participation in an intensive rehabilitation regime.: Yes Daily analysis of laboratory values and/or radiology reports with any subsequent need for medication adjustment of medical intervention for : Post surgical problems;Neurological problems;Other  Taylee Gunnells 12/26/2016, 12:08 PM

## 2016-12-26 NOTE — Progress Notes (Signed)
Speech Language Pathology Weekly Progress and Session Note  Patient Details  Name: Alex [name] MRN: [id number] Date of Birth: 1931/01/01  Beginning of progress report period: December 19, 2016 End of progress report period: December 26, 2016  Today's Date: 12/26/2016 SLP Individual Time: 7829-5621 SLP Individual Time Calculation (min): 45 min  Short Term Goals: Week 1: SLP Short Term Goal 1 (Week 1): Patient will consume current diet with minimal overt s/s of aspiration with Min A verbal cues.  SLP Short Term Goal 1 - Progress (Week 1): Met SLP Short Term Goal 2 (Week 1): Pt will consumed trials of dysphagia 2 with minimal overt s/s of aspiration and Min A verbal cues for complete oral clearing.  SLP Short Term Goal 2 - Progress (Week 1): Met SLP Short Term Goal 3 (Week 1): Pt will sustained attention for ~5 minutes with Mod A verbal cues for redirection to tasks. SLP Short Term Goal 3 - Progress (Week 1): Met SLP Short Term Goal 4 (Week 1): Pt will answer orientation questions with Max A multimodal cues.  SLP Short Term Goal 4 - Progress (Week 1): Met SLP Short Term Goal 5 (Week 1): Pt will follow 1 step functional basic directions to complete ADL task with Min A cues.  SLP Short Term Goal 5 - Progress (Week 1): Met SLP Short Term Goal 6 (Week 1): Pt will demonstrate functional problem solving of basic, famaliar tasks with Mod A verbal cues.  SLP Short Term Goal 6 - Progress (Week 1): Met    New Short Term Goals: Week 2: SLP Short Term Goal 1 (Week 2): Pt will demonstrate functional problem solving of basic, famaliar tasks with Min A verbal cues.  SLP Short Term Goal 2 (Week 2): Pt will answer orientation questions with Mod A multimodal cues.  SLP Short Term Goal 3 (Week 2): Pt will sustain attention for ~15 minutes with Mod A verbal cues for redirection to tasks. SLP Short Term Goal 4 (Week 2): Pt will consume trials of dysphagia 3 textures with minimal overt s/s of aspiration and Min A  verbal cues for complete oral clearing over 2 session prior to upgrade.  SLP Short Term Goal 5 (Week 2): Patient will consume current diet with minimal overt s/s of aspiration with Supervision verbal cues.   Weekly Progress Updates: Patient has made functional gains and has met 6 of 6 STG's this reporting period. Currently, patient is consuming Dys. 2 textures with thin liquids with minimal overt s/s of aspiration and requires overall Min A verbal cues for use of swallowing compensatory strategies. Patient requires overall Min A verbal cues for sustained attention to tasks, Mod A verbal cues for functional problem solving and Max A for recall of new information. Patient and family education is ongoing. Patient would benefit from continued skilled SLP intervention to maximize his cognitive and swallowing function and overall functional independence prior to discharge.      Intensity: Minumum of 1-2 x/day, 30 to 90 minutes Frequency: 3 to 5 out of 7 days Duration/Length of Stay: 01/10/17 Treatment/Interventions: Cognitive remediation/compensation;Cueing hierarchy;Dysphagia/aspiration precaution training;Environmental controls;Functional tasks;Internal/external aids;Patient/family education;Speech/Language facilitation;Therapeutic Activities   Daily Session  Skilled Therapeutic Interventions: Skilled treatment session focused on cognitive and dysphagia goals. Upon arrival, patient was asleep in bed and required extra time and Min verbal and tactile cues for arousal. Patient then transferred to the recliner with Mod verbal cues needed for problem solving with task. Patient oriented to place and situation but required total A  for orientation to date. Patient consumed breakfast meal of Dys. 2 textures with thin liquids without overt s/s of aspiration and Min verbal cues needed for use of swallowing compensatory strategies. Patient left upright in recliner with quick release belt in place and all needs within  reach. Continue with current plan of care.       Function:   Eating Eating   Modified Consistency Diet: Yes Eating Assist Level: Set up assist for;Supervision or verbal cues;Helper checks for pocketed food   Eating Set Up Assist For: Opening containers;Applying device (includes dentures)       Cognition Comprehension Comprehension assist level: Understands basic 75 - 89% of the time/ requires cueing 10 - 24% of the time  Expression   Expression assist level: Expresses basic 75 - 89% of the time/requires cueing 10 - 24% of the time. Needs helper to occlude trach/needs to repeat words.  Social Interaction Social Interaction assist level: Interacts appropriately 75 - 89% of the time - Needs redirection for appropriate language or to initiate interaction.  Problem Solving Problem solving assist level: Solves basic 50 - 74% of the time/requires cueing 25 - 49% of the time  Memory Memory assist level: Recognizes or recalls 25 - 49% of the time/requires cueing 50 - 75% of the time   Pain No/Denies Pain   Therapy/Group: Individual Therapy  Pavielle Biggar 12/26/2016, 3:35 PM

## 2016-12-26 NOTE — Progress Notes (Signed)
Social Work Patient ID: Alex [name], male   DOB: [birth year], 83 y.o.   MRN: [id number]   Met with pt, wife and adult children following team conference on Tuesday afternoon.  All aware and agreeable with targeted d/c date of 3/17 with supervision goals.  Pleased with progress and current functional status compared to how he was during first CIR stay.  Son and daughter very supportive to mother in discussion of how they will meet supervision care needs upon d/c.  Answered some general questions about d/c arrangements to be made.  Will continue to follow for support and d/c planning needs.  Bonne Whack, LCSW

## 2016-12-26 NOTE — Progress Notes (Addendum)
Occupational Therapy Session Note  Patient Details  Name: Alex DuhamelJohn R [name] MRN: [id number] Date of Birth: Nov 19, 1930  Today's Date: 12/26/2016 OT Individual Time: 1045-1200 OT Individual Time Calculation (min): 75 min    Short Term Goals: Week 1:  OT Short Term Goal 1 (Week 1): Pt will perform stand pivot transfer with mod A +1 to BSC/ toilet OT Short Term Goal 2 (Week 1): Pt will perform sit to stand for clothing management with  mod A  OT Short Term Goal 3 (Week 1): Pt will perform bed mobility in prep for ADL task with mod A with bed rails and extra time OT Short Term Goal 4 (Week 1): Pt will be oriented x4 with mod external cues  Skilled Therapeutic Interventions/Progress Updates:    Pt seen this session to facilitate activity tolerance, sit >< stand, standing balance. Pt received in room and agreeable to therapy despite stating he is very tired.  Pt taken to gym and transferred to mat with close S.  He was fully oriented x 4 with no cuing.  From edge of mat, 3 sets of sit to stand of 5x with no UE support  Alternating with 3 sets of UE arom exercises of arm circles and chest presses  Pt needed to toilet, taken back to room to use urinal for urine sample that needed to be collected. Pt stood at sink and managed all clothing with S, held urinal himself. Stood at sink to wash hands.  Returned to gym to transfer back to mat. Pt worked on 10 hip bridges, alternating with 10 chest presses using 5 lb dowel bar with close spotting as he demonstrated some mild ataxia with LUE.   Pt transferred back to w/c with S.   Taken to room with spouse in room with pt and quick release belt on, call light in reach.  Therapy Documentation Precautions:  Precautions Precautions: Fall Precaution Comments: ataxic, poor safety awareness Restrictions Weight Bearing Restrictions: No   Pain: Pain Assessment Pain Assessment: No/denies pain Pain Score: 0-No pain ADL:   See Function Navigator for  Current Functional Status.   Therapy/Group: Individual Therapy  Kadeshia Kasparian 12/26/2016, 12:39 PM

## 2016-12-27 ENCOUNTER — Inpatient Hospital Stay (HOSPITAL_COMMUNITY): Payer: Medicare Other | Admitting: Occupational Therapy

## 2016-12-27 ENCOUNTER — Inpatient Hospital Stay (HOSPITAL_COMMUNITY): Payer: Medicare Other | Admitting: Speech Pathology

## 2016-12-27 DIAGNOSIS — R609 Edema, unspecified: Secondary | ICD-10-CM

## 2016-12-27 LAB — URINE CULTURE

## 2016-12-27 MED ORDER — LIDOCAINE HCL 2 % EX GEL
1.0000 "application " | CUTANEOUS | Status: DC | PRN
  Administered 2016-12-31: 1 via URETHRAL
  Filled 2016-12-27 (×2): qty 5
  Filled 2016-12-27: qty 10
  Filled 2016-12-27 (×2): qty 5

## 2016-12-27 MED ORDER — BETHANECHOL CHLORIDE 25 MG PO TABS
50.0000 mg | ORAL_TABLET | Freq: Three times a day (TID) | ORAL | Status: DC
  Administered 2016-12-28 – 2016-12-31 (×11): 50 mg via ORAL
  Filled 2016-12-27 (×11): qty 2

## 2016-12-27 NOTE — Progress Notes (Signed)
Occupational Therapy Session Note  Patient Details  Name: Alex DuhamelJohn R [name] MRN: [id number] Date of Birth: 11/02/1930  Today's Date: 12/27/2016 OT Individual Time: 1300-1343 OT Individual Time Calculation (min): 43 min    Short Term Goals: Week 1:  OT Short Term Goal 1 (Week 1): Pt will perform stand pivot transfer with mod A +1 to BSC/ toilet OT Short Term Goal 2 (Week 1): Pt will perform sit to stand for clothing management with  mod A  OT Short Term Goal 3 (Week 1): Pt will perform bed mobility in prep for ADL task with mod A with bed rails and extra time OT Short Term Goal 4 (Week 1): Pt will be oriented x4 with mod external cues  Skilled Therapeutic Interventions/Progress Updates:    Upon entering the room, pt seated in recliner chair with wife present in the room. Pt reports 5/10 generalized pain described as soreness. Pt reports taking medication prior to arrival. Pt requesting need for toileting. Pt performed sit <>stand with steady assistance and ambulating into bathroom with RW and min A. Pt having BM this session and performed clothing management and hygiene with steady assistance. Pt standing to wash buttocks for thoroughness with min A for standing balance. Pt returning to wheelchair for grooming tasks at sink. Pt able to open containers with increased time and verbal cues for technique. Pt returning to recliner chair at end of session with call bell and all needed items within reach.   Therapy Documentation Precautions:  Precautions Precautions: Fall Precaution Comments: ataxic, poor safety awareness Restrictions Weight Bearing Restrictions: No General:   Vital Signs:   Pain:   ADL:   Exercises:   Other Treatments:    See Function Navigator for Current Functional Status.   Therapy/Group: Individual Therapy  Alen BleacherBradsher, Tonji Elliff P 12/27/2016, 1:44 PM

## 2016-12-27 NOTE — Progress Notes (Signed)
Speech Language Pathology Daily Session Note  Patient Details  Name: Alex DuhamelJohn R Belford MRN: [id number] Date of Birth: [birth year]  Today's Date: 12/27/2016 SLP Individual Time: [id number] SLP Individual Time Calculation (min): 33 min  Short Term Goals: Week 2: SLP Short Term Goal 1 (Week 2): Pt will demonstrate functional problem solving of basic, famaliar tasks with Min A verbal cues.  SLP Short Term Goal 2 (Week 2): Pt will answer orientation questions with Mod A multimodal cues.  SLP Short Term Goal 3 (Week 2): Pt will sustain attention for ~15 minutes with Mod A verbal cues for redirection to tasks. SLP Short Term Goal 4 (Week 2): Pt will consume trials of dysphagia 3 textures with minimal overt s/s of aspiration and Min A verbal cues for complete oral clearing over 2 session prior to upgrade.  SLP Short Term Goal 5 (Week 2): Patient will consume current diet with minimal overt s/s of aspiration with Supervision verbal cues.   Skilled Therapeutic Interventions: Skilled treatment session focused on addressing dysphagia and cognition goals. Upon SLP arrival wife leaving and reported that patient wanted to get up brush his teeth and then have a snack.  SLP facilitated session by providing Min physical and Min verbal cues to maintain balance and upright posture at sink during oral care.  Patient reported that his back was going out and needed to sit quickly.  He completed the second half of task seated with Min assist for completion of placing dentures.  Per patient request ice cream and ice water via straw which he consumed with set-up assist and no overt s/s pf aspiration.  Patient alternated attention between self-feeding and conversation with SLP for ~10 minutes with no cues for redirection.  Continue with current plan of care.   Function:  Eating Eating   Modified Consistency Diet: Yes Eating Assist Level: Set up assist for;Supervision or verbal cues   Eating Set Up Assist For: Opening  containers;Applying device (includes dentures)       Cognition Comprehension Comprehension assist level: Understands basic 75 - 89% of the time/ requires cueing 10 - 24% of the time  Expression   Expression assist level: Expresses basic 75 - 89% of the time/requires cueing 10 - 24% of the time. Needs helper to occlude trach/needs to repeat words.  Social Interaction Social Interaction assist level: Interacts appropriately 75 - 89% of the time - Needs redirection for appropriate language or to initiate interaction.  Problem Solving Problem solving assist level: Solves basic 50 - 74% of the time/requires cueing 25 - 49% of the time  Memory Memory assist level: Recognizes or recalls 25 - 49% of the time/requires cueing 50 - 75% of the time    Pain Pain Assessment Pain Assessment: No/denies pain  Therapy/Group: Individual Therapy  Charlane FerrettiMelissa Rusty Villella, M.A., CCC-SLP [phone]  Abyan Cadman 12/27/2016, 4:25 PM

## 2016-12-27 NOTE — Progress Notes (Signed)
[facility]ILIT[facility]     PROGRESS NOTE  Subjective/Complaints:  Pt seen laying in bed this AM.  He slept well overnight.  Per nursing and patient, he was able to urinate yesterday, but not this AM.   ROS: Denies CP, SOB, N/V/D.  Objective: Vital Signs: Blood pressure 98/60, pulse 64, temperature 98.6 F (37 C), temperature source Oral, resp. rate 16, weight 104 kg (229 lb 4.5 oz), SpO2 99 %. No results found. No results for input(s): WBC, HGB, HCT, PLT in the last 72 hours. No results for input(s): NA, K, CL, GLUCOSE, BUN, CREATININE, CALCIUM in the last 72 hours.  Invalid input(s): CO CBG (last 3)  No results for input(s): GLUCAP in the last 72 hours.  Wt Readings from Last 3 Encounters:  12/27/16 104 kg (229 lb 4.5 oz)  12/16/16 103.2 kg (227 lb 8.2 oz)  12/15/16 105.2 kg (232 lb)    Physical Exam:  BP 98/60 (BP Location: Right Leg)   Pulse 64   Temp 98.6 F (37 C) (Oral)   Resp 16   Wt 104 kg (229 lb 4.5 oz)   SpO2 99%   BMI 29.44 kg/m  Constitutional: No distress. Well-developed. Vital signs reviewed.  HENT: Staples in place Eyes: EOMI. No discharge.  Cardiovascular: Murmurheard. IRRR. No JVD. Respiratory: Effort normal. Clear.  GI: Soft. Bowel sounds are normal.  Musc: No tenderness. LE edema. Neurological.Alert and oriented x2 (difficulty with date of month).  Motor: B/l UE 4/5 prox to distal.  B/l LE: 4+/5 proximal to distal (unchanged). Skin: Warm and dry. Staples to scalp c/d/i. Psych: pleasant and cooperative.    Assessment/Plan: 1. Functional deficits secondary to SDH sp craniotomy which require 3+ hours per day of interdisciplinary therapy in a comprehensive inpatient rehab setting. Physiatrist is providing close team supervision and 24 hour management of active medical problems listed below. Physiatrist and rehab team continue to assess barriers to discharge/monitor patient progress toward functional and medical  goals.  Function:  Bathing Bathing position Bathing activity did not occur: Refused Position: Systems developer parts bathed by patient: Right arm, Left arm, Chest, Abdomen, Front perineal area, Buttocks, Right upper leg, Left upper leg Body parts bathed by helper: Right lower leg, Left lower leg, Back  Bathing assist Assist Level: Touching or steadying assistance(Pt > 75%)      Upper Body Dressing/Undressing Upper body dressing Upper body dressing/undressing activity did not occur: Refused What is the patient wearing?: Pull over shirt/dress     Pull over shirt/dress - Perfomed by patient: Thread/unthread right sleeve, Pull shirt over trunk, Put head through opening, Thread/unthread left sleeve Pull over shirt/dress - Perfomed by helper: Thread/unthread left sleeve        Upper body assist Assist Level: Supervision or verbal cues      Lower Body Dressing/Undressing Lower body dressing   What is the patient wearing?: Underwear, Pants Underwear - Performed by patient: Thread/unthread left underwear leg, Pull underwear up/down Underwear - Performed by helper: Thread/unthread right underwear leg, Thread/unthread left underwear leg, Pull underwear up/down Pants- Performed by patient: Thread/unthread right pants leg, Pull pants up/down Pants- Performed by helper: Thread/unthread right pants leg, Thread/unthread left pants leg, Pull pants up/down   Non-skid slipper socks- Performed by helper: Don/doff right sock, Don/doff left sock   Socks - Performed by helper: Don/doff right sock              Lower body assist Assist for lower body dressing:  Touching or steadying assistance (Pt > 75%)      Toileting Toileting Toileting activity did not occur: No continent bowel/bladder event Toileting steps completed by patient: Adjust clothing prior to toileting, Performs perineal hygiene, Adjust clothing after toileting Toileting steps completed by helper: Adjust clothing prior  to toileting, Performs perineal hygiene, Adjust clothing after toileting Toileting Assistive Devices: Grab bar or rail  Toileting assist Assist level: Touching or steadying assistance (Pt.75%)   Transfers Chair/bed transfer   Chair/bed transfer method: Stand pivot Chair/bed transfer assist level: Supervision or verbal cues Chair/bed transfer assistive device: Armrests, Patent attorneyWalker     Locomotion Ambulation     Max distance: 75 Assist level: Supervision or verbal cues   Wheelchair   Type: Manual Max wheelchair distance: 70 Assist Level: Supervision or verbal cues  Cognition Comprehension Comprehension assist level: Understands basic 75 - 89% of the time/ requires cueing 10 - 24% of the time  Expression Expression assist level: Expresses basic 75 - 89% of the time/requires cueing 10 - 24% of the time. Needs helper to occlude trach/needs to repeat words.  Social Interaction Social Interaction assist level: Interacts appropriately 75 - 89% of the time - Needs redirection for appropriate language or to initiate interaction.  Problem Solving Problem solving assist level: Solves basic 50 - 74% of the time/requires cueing 25 - 49% of the time  Memory Memory assist level: Recognizes or recalls 25 - 49% of the time/requires cueing 50 - 75% of the time    Medical Problem List and Plan: 1. 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 DVT Prophylaxis/Anticoagulation: SCDs. Monitor for any signs of DVT 3. Pain Management: Hydrocodone as needed 4. Mood: Xanax 0.25 mg daily at bedtime,Lexapro 10 mg daily 5. Neuropsych: This patient is notcapable of making decisions on hisown behalf. 6. Skin/Wound Care: Routine skin checks 7. Fluids/Electrolytes/Nutrition: Routine I&Os 8.Seizure prophylaxis. Keppra 500 mg every 12 hours 9.Atrial fibrillation/AVR/CABG/PPM. Continue  low-dose aspirin. Amiodarone 200 mg twice a day, Toprol 12.5 mg twice a day. Follow-up per cardiology services 10.Dysphagia. Dysphagia #1 thin liquid diet advanced to D2 thin. Follow-up speech therapy 11.Acute diastolic congestive heart failure. Lasix 20 mg daily. Monitor for any fluid overload 12.Hypokalemia.   K+ 3.8 on 2/26  Supplement increased, while on lasix  Cont to monitor 13.History of COPD. Check oxygen saturations every shift 14.History of alcohol abuse. Monitor for withdrawal. Provide counseling as appropriate 15.Constipation. Laxative assistance 16. Transaminitis:   Elevated 2/26  Atorvastatin decreased to 40 on 2/26  Will order labs for Monday  Cont to monitor 17. Leukocytosis  Afebrile  WBCs 11.0 on 2/26  Will order labs for Monday  Will cont to monitor 18. Macrocytic anemia  Hb 10.7 on 2/26  Will order labs for Monday  Cont Vit supplementation 19. HTN  Low dose Lisinopril started 2/28  Controlled 3/3  Cont to monitor 20. Urinary retention  Bethanechol 25, increased to 50 on 3/3, monitor for side effects  Improving  UA?+, Ucx neg 21. LE edema  Lasix increased 3/2  LOS (Days) 8 A FACE TO FACE EVALUATION WAS PERFORMED  Ankit Karis Jubanil Patel 12/27/2016 8:44 PM

## 2016-12-28 NOTE — Progress Notes (Signed)
Physical Therapy Weekly Progress Note  Patient Details  Name: Alex [name] MRN: [id number] Date of Birth: Aug 13, 1931  Beginning of progress report period: December 20, 2016 End of progress report period: December 28, 2016  Today's Date: 12/28/2016   Patient has met 2 of 3 short term goals.  Pt is making good progress towards LTG's and pt is very motivated to participate in treatments. Pt continues to be limited by fatigue with activity. Pt would benefit from continued skilled PT to focus on L NMR, cognitive remediation, balance, endurance, activity tolerance, strengthening, and pt/caregiver education prior to d/c.  Patient continues to demonstrate the following deficits muscle weakness, decreased cardiorespiratoy endurance, decreased coordination and decreased motor planning, decreased attention to left, decreased awareness, decreased problem solving, decreased safety awareness and decreased memory, and decreased standing balance, decreased postural control and decreased balance strategies and therefore will continue to benefit from skilled PT intervention to increase functional independence with mobility.  Patient progressing toward long term goals..  Continue plan of care.  PT Short Term Goals Week 1:  PT Short Term Goal 1 (Week 1): pt will transfer consistently with min assist and LRAD PT Short Term Goal 1 - Progress (Week 1): Met PT Short Term Goal 2 (Week 1): Pt will ambulate 9' with LRAD and min assist PT Short Term Goal 2 - Progress (Week 1): Met PT Short Term Goal 3 (Week 1): pt will demonstrate intellectual awareness with min cues in 25% of opportunities PT Short Term Goal 3 - Progress (Week 1): Progressing toward goal Week 2:  PT Short Term Goal 1 (Week 2): Pt will negotiate 12 steps with min assist for BLE strengthening. PT Short Term Goal 3 (Week 2): Pt will demonstrate intellectual awareness with supervision.   Therapy Documentation Precautions:  Precautions Precautions:  Fall Precaution Comments: ataxic, poor safety awareness Restrictions Weight Bearing Restrictions: No   Waunita Schooner 12/28/2016, 2:20 PM

## 2016-12-28 NOTE — Progress Notes (Signed)
[facility]IT[facility]     PROGRESS NOTE  Subjective/Complaints:  Pt seen laying in bed this AM.  He states he slept well overnight and notes that his bladder function is improving.   ROS: Denies CP, SOB, N/V/D.  Objective: Vital Signs: Blood pressure 110/68, pulse 60, temperature 98 F (36.7 C), temperature source Oral, resp. rate 18, weight 103 kg (227 lb 1.2 oz), SpO2 98 %. No results found. No results for input(s): WBC, HGB, HCT, PLT in the last 72 hours. No results for input(s): NA, K, CL, GLUCOSE, BUN, CREATININE, CALCIUM in the last 72 hours.  Invalid input(s): CO CBG (last 3)  No results for input(s): GLUCAP in the last 72 hours.  Wt Readings from Last 3 Encounters:  12/28/16 103 kg (227 lb 1.2 oz)  12/16/16 103.2 kg (227 lb 8.2 oz)  12/15/16 105.2 kg (232 lb)    Physical Exam:  BP 110/68 (BP Location: Right Arm)   Pulse 60   Temp 98 F (36.7 C) (Oral)   Resp 18   Wt 103 kg (227 lb 1.2 oz)   SpO2 98%   BMI 29.15 kg/m  Constitutional: NAD. Well-developed. Vital signs reviewed.  HENT: Staples in place Eyes: EOMI. No discharge.  Cardiovascular: Murmurheard. IRRR. No JVD. Respiratory: Effort normal. Clear.  GI: Soft. Bowel sounds are normal.  Musc: No tenderness. LE edema, improving. Neurological.Alert and oriented x2 (difficulty with date of month).  Motor: B/l UE 4/5 prox to distal.  B/l LE: 4+/5 proximal to distal (stable). Skin: Warm and dry. Staples to scalp c/d/i. Psych: pleasant and cooperative.    Assessment/Plan: 1. Functional deficits secondary to SDH sp craniotomy which require 3+ hours per day of interdisciplinary therapy in a comprehensive inpatient rehab setting. Physiatrist is providing close team supervision and 24 hour management of active medical problems listed below. Physiatrist and rehab team continue to assess barriers to discharge/monitor patient progress toward functional and medical  goals.  Function:  Bathing Bathing position Bathing activity did not occur: Refused Position: Systems developerhower  Bathing parts Body parts bathed by patient: Right arm, Left arm, Chest, Abdomen, Front perineal area, Buttocks, Right upper leg, Left upper leg Body parts bathed by helper: Right lower leg, Left lower leg, Back  Bathing assist Assist Level: Touching or steadying assistance(Pt > 75%)      Upper Body Dressing/Undressing Upper body dressing Upper body dressing/undressing activity did not occur: Refused What is the patient wearing?: Pull over shirt/dress     Pull over shirt/dress - Perfomed by patient: Thread/unthread right sleeve, Pull shirt over trunk, Put head through opening, Thread/unthread left sleeve Pull over shirt/dress - Perfomed by helper: Thread/unthread left sleeve        Upper body assist Assist Level: Supervision or verbal cues      Lower Body Dressing/Undressing Lower body dressing   What is the patient wearing?: Underwear, Pants Underwear - Performed by patient: Thread/unthread left underwear leg, Pull underwear up/down Underwear - Performed by helper: Thread/unthread right underwear leg, Thread/unthread left underwear leg, Pull underwear up/down Pants- Performed by patient: Thread/unthread right pants leg, Pull pants up/down Pants- Performed by helper: Thread/unthread right pants leg, Thread/unthread left pants leg, Pull pants up/down   Non-skid slipper socks- Performed by helper: Don/doff right sock, Don/doff left sock   Socks - Performed by helper: Don/doff right sock              Lower body assist Assist for lower body dressing: Touching or steadying assistance (Pt >  75%)      Toileting Toileting Toileting activity did not occur: No continent bowel/bladder event Toileting steps completed by patient: Adjust clothing prior to toileting, Performs perineal hygiene, Adjust clothing after toileting Toileting steps completed by helper: Adjust clothing prior  to toileting, Performs perineal hygiene, Adjust clothing after toileting Toileting Assistive Devices: Grab bar or rail  Toileting assist Assist level: Touching or steadying assistance (Pt.75%)   Transfers Chair/bed transfer   Chair/bed transfer method: Stand pivot Chair/bed transfer assist level: Supervision or verbal cues Chair/bed transfer assistive device: Armrests, Patent attorney     Max distance: 75 Assist level: Supervision or verbal cues   Wheelchair   Type: Manual Max wheelchair distance: 70 Assist Level: Supervision or verbal cues  Cognition Comprehension Comprehension assist level: Understands basic 90% of the time/cues < 10% of the time  Expression Expression assist level: Expresses basic 90% of the time/requires cueing < 10% of the time.  Social Interaction Social Interaction assist level: Interacts appropriately 90% of the time - Needs monitoring or encouragement for participation or interaction.  Problem Solving Problem solving assist level: Solves basic 90% of the time/requires cueing < 10% of the time  Memory Memory assist level: Recognizes or recalls 90% of the time/requires cueing < 10% of the time    Medical Problem List and Plan: 1. 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 DVT Prophylaxis/Anticoagulation: SCDs. Monitor for any signs of DVT 3. Pain Management: Hydrocodone as needed 4. Mood: Xanax 0.25 mg daily at bedtime,Lexapro 10 mg daily 5. Neuropsych: This patient is notcapable of making decisions on hisown behalf. 6. Skin/Wound Care: Routine skin checks 7. Fluids/Electrolytes/Nutrition: Routine I&Os 8.Seizure prophylaxis. Keppra 500 mg every 12 hours 9.Atrial fibrillation/AVR/CABG/PPM. Continue low-dose aspirin. Amiodarone 200 mg twice a day, Toprol 12.5 mg twice a day. Follow-up per cardiology  services 10.Dysphagia. Dysphagia #1 thin liquid diet advanced to D2 thin. Follow-up speech therapy 11.Acute diastolic congestive heart failure. Lasix 20 mg daily. Monitor for any fluid overload 12.Hypokalemia.   K+ 3.8 on 2/26  Supplement increased, while on lasix  Labs ordered for tomorrow  Cont to monitor 13.History of COPD. Check oxygen saturations every shift 14.History of alcohol abuse. Monitor for withdrawal. Provide counseling as appropriate 15.Constipation. Laxative assistance 16. Transaminitis:   Elevated 2/26  Atorvastatin decreased to 40 on 2/26  Labs ordered for tomorrow  Cont to monitor 17. Leukocytosis  Afebrile  WBCs 11.0 on 2/26  Labs ordered for tomorrow  Will cont to monitor 18. Macrocytic anemia  Hb 10.7 on 2/26  Labs ordered for tomorrow  Cont Vit supplementation 19. HTN  Low dose Lisinopril started 2/28  Controlled 3/4, may consider d/cing  Cont to monitor 20. Urinary retention  Bethanechol 25, increased to 50 on 3/3, monitor for side effects  Was improving, however, last 24 hours increased retention, ?related to lasix  UA?+, Ucx neg 21. LE edema  Lasix increased 3/2  LOS (Days) 9 A FACE TO FACE EVALUATION WAS PERFORMED  Maryam Feely Karis Juba 12/28/2016 8:45 AM

## 2016-12-29 ENCOUNTER — Inpatient Hospital Stay (HOSPITAL_COMMUNITY): Payer: Medicare Other

## 2016-12-29 ENCOUNTER — Inpatient Hospital Stay (HOSPITAL_COMMUNITY): Payer: Medicare Other | Admitting: Physical Therapy

## 2016-12-29 ENCOUNTER — Inpatient Hospital Stay (HOSPITAL_COMMUNITY): Payer: Medicare Other | Admitting: Speech Pathology

## 2016-12-29 DIAGNOSIS — S065X2S Traumatic subdural hemorrhage with loss of consciousness of 31 minutes to 59 minutes, sequela: Secondary | ICD-10-CM

## 2016-12-29 DIAGNOSIS — J410 Simple chronic bronchitis: Secondary | ICD-10-CM

## 2016-12-29 LAB — COMPREHENSIVE METABOLIC PANEL
ALBUMIN: 2.3 g/dL — AB (ref 3.5–5.0)
ALT: 80 U/L — AB (ref 17–63)
AST: 31 U/L (ref 15–41)
Alkaline Phosphatase: 47 U/L (ref 38–126)
Anion gap: 3 — ABNORMAL LOW (ref 5–15)
BUN: 19 mg/dL (ref 6–20)
CHLORIDE: 100 mmol/L — AB (ref 101–111)
CO2: 32 mmol/L (ref 22–32)
CREATININE: 0.79 mg/dL (ref 0.61–1.24)
Calcium: 8.1 mg/dL — ABNORMAL LOW (ref 8.9–10.3)
GFR calc non Af Amer: >60 mL/min (ref >60)
GLUCOSE: 136 mg/dL — AB (ref 65–99)
Potassium: 4.3 mmol/L (ref 3.5–5.1)
SODIUM: 135 mmol/L (ref 135–145)
Total Bilirubin: 0.7 mg/dL (ref 0.3–1.2)
Total Protein: 5 g/dL — ABNORMAL LOW (ref 6.5–8.1)

## 2016-12-29 LAB — CBC WITH DIFFERENTIAL/PLATELET
Basophils Absolute: 0 10*3/uL (ref 0.0–0.1)
Basophils Relative: 0 %
EOS ABS: 0 10*3/uL (ref 0.0–0.7)
Eosinophils Relative: 0 %
HEMATOCRIT: 36.2 % — AB (ref 39.0–52.0)
HEMOGLOBIN: 11.9 g/dL — AB (ref 13.0–17.0)
LYMPHS ABS: 0.3 10*3/uL — AB (ref 0.7–4.0)
Lymphocytes Relative: 2 %
MCH: 32.7 pg (ref 26.0–34.0)
MCHC: 32.9 g/dL (ref 30.0–36.0)
MCV: 99.5 fL (ref 78.0–100.0)
MONOS PCT: 2 %
Monocytes Absolute: 0.3 10*3/uL (ref 0.1–1.0)
NEUTROS PCT: 96 %
Neutro Abs: 17.7 10*3/uL — ABNORMAL HIGH (ref 1.7–7.7)
Platelets: 145 10*3/uL — ABNORMAL LOW (ref 150–400)
RBC: 3.64 MIL/uL — ABNORMAL LOW (ref 4.22–5.81)
RDW: 13.7 % (ref 11.5–15.5)
WBC: 18.4 10*3/uL — ABNORMAL HIGH (ref 4.0–10.5)

## 2016-12-29 MED ORDER — PHENOL 1.4 % MT LIQD
1.0000 | OROMUCOSAL | Status: DC | PRN
  Filled 2016-12-29: qty 177

## 2016-12-29 MED ORDER — DEXAMETHASONE 4 MG PO TABS
4.0000 mg | ORAL_TABLET | Freq: Two times a day (BID) | ORAL | Status: DC
  Administered 2016-12-29 – 2016-12-31 (×5): 4 mg via ORAL
  Filled 2016-12-29 (×5): qty 1

## 2016-12-29 NOTE — Progress Notes (Signed)
[facility] MEDIC[facility] & REHABILITATION     PROGRESS NOTE  Subjective/Complaints:  Complains of sore throat. Still not emptying bladder. Denies any other problems. Good appetite  ROS: pt denies nausea, vomiting, diarrhea, cough, shortness of breath or chest pain  Objective: Vital Signs: Blood pressure (!) 112/59, pulse (!) 59, temperature 98.9 F (37.2 C), temperature source Oral, resp. rate 18, weight 99.4 kg (219 lb 2.2 oz), SpO2 99 %. No results found. No results for input(s): WBC, HGB, HCT, PLT in the last 72 hours.  Recent Labs  12/29/16 0605  NA 135  K 4.3  CL 100*  GLUCOSE 136*  BUN 19  CREATININE 0.79  CALCIUM 8.1*   CBG (last 3)  No results for input(s): GLUCAP in the last 72 hours.  Wt Readings from Last 3 Encounters:  12/29/16 99.4 kg (219 lb 2.2 oz)  12/16/16 103.2 kg (227 lb 8.2 oz)  12/15/16 105.2 kg (232 lb)    Physical Exam:  BP (!) 112/59 (BP Location: Right Arm)   Pulse (!) 59   Temp 98.9 F (37.2 C) (Oral)   Resp 18   Wt 99.4 kg (219 lb 2.2 oz)   SpO2 99%   BMI 28.14 kg/m  Constitutional: NAD. Well-developed. Vital signs reviewed.  Mouth-0ral mucosa pink/moist HENT: Staples in place Eyes: EOMI. No discharge.  Cardiovascular: Murmurheard. IRRR. No JVD. Respiratory: Effort normal. Clear.  GI: Soft. Bowel sounds are normal.  Musc: No tenderness. LE edema, improving. Neurological.Alert and oriented x2 (difficulty with date of month).  Motor: B/l UE 4/5 prox to distal.  B/l LE: 4+/5 proximal to distal (stable). Skin: Warm and dry. Staples to scalp c/d/i. Psych: pleasant and cooperative.    Assessment/Plan: 1. Functional deficits secondary to SDH sp craniotomy which require 3+ hours per day of interdisciplinary therapy in a comprehensive inpatient rehab setting. Physiatrist is providing close team supervision and 24 hour management of active medical problems listed below. Physiatrist and rehab team continue to assess barriers to  discharge/monitor patient progress toward functional and medical goals.  Function:  Bathing Bathing position Bathing activity did not occur: Refused Position: Systems developer parts bathed by patient: Right arm, Left arm, Chest, Abdomen, Front perineal area, Buttocks, Right upper leg, Left upper leg Body parts bathed by helper: Right lower leg, Left lower leg, Back  Bathing assist Assist Level: Touching or steadying assistance(Pt > 75%)      Upper Body Dressing/Undressing Upper body dressing Upper body dressing/undressing activity did not occur: Refused What is the patient wearing?: Pull over shirt/dress     Pull over shirt/dress - Perfomed by patient: Thread/unthread right sleeve, Pull shirt over trunk, Put head through opening, Thread/unthread left sleeve Pull over shirt/dress - Perfomed by helper: Thread/unthread left sleeve        Upper body assist Assist Level: Supervision or verbal cues      Lower Body Dressing/Undressing Lower body dressing   What is the patient wearing?: Underwear, Pants Underwear - Performed by patient: Thread/unthread left underwear leg, Pull underwear up/down Underwear - Performed by helper: Thread/unthread right underwear leg, Thread/unthread left underwear leg, Pull underwear up/down Pants- Performed by patient: Thread/unthread right pants leg, Pull pants up/down Pants- Performed by helper: Thread/unthread right pants leg, Thread/unthread left pants leg, Pull pants up/down   Non-skid slipper socks- Performed by helper: Don/doff right sock, Don/doff left sock   Socks - Performed by helper: Don/doff right sock  Lower body assist Assist for lower body dressing: Touching or steadying assistance (Pt > 75%)      Toileting Toileting Toileting activity did not occur: No continent bowel/bladder event Toileting steps completed by patient: Adjust clothing prior to toileting, Performs perineal hygiene, Adjust clothing after  toileting Toileting steps completed by helper: Adjust clothing prior to toileting, Performs perineal hygiene, Adjust clothing after toileting Toileting Assistive Devices: Grab bar or rail  Toileting assist Assist level: Touching or steadying assistance (Pt.75%)   Transfers Chair/bed transfer   Chair/bed transfer method: Stand pivot Chair/bed transfer assist level: Supervision or verbal cues Chair/bed transfer assistive device: Armrests, Patent attorneyWalker     Locomotion Ambulation     Max distance: 75 Assist level: Supervision or verbal cues   Wheelchair   Type: Manual Max wheelchair distance: 70 Assist Level: Supervision or verbal cues  Cognition Comprehension Comprehension assist level: Follows basic conversation/direction with no assist  Expression Expression assist level: Expresses basic needs/ideas: With no assist  Social Interaction Social Interaction assist level: Interacts appropriately 90% of the time - Needs monitoring or encouragement for participation or interaction.  Problem Solving Problem solving assist level: Solves basic 90% of the time/requires cueing < 10% of the time  Memory Memory assist level: Recognizes or recalls 90% of the time/requires cueing < 10% of the time    Medical Problem List and Plan: 1. 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 DVT Prophylaxis/Anticoagulation: SCDs. Monitor for any signs of DVT 3. Pain Management: Hydrocodone as needed 4. Mood: Xanax 0.25 mg daily at bedtime,Lexapro 10 mg daily 5. Neuropsych: This patient is notcapable of making decisions on hisown behalf. 6. Skin/Wound Care: Remove staples 7. Fluids/Electrolytes/Nutrition: Routine I&Os 8.Seizure prophylaxis. Keppra 500 mg every 12 hours 9.Atrial fibrillation/AVR/CABG/PPM. Continue low-dose aspirin. Amiodarone 200 mg twice a day, Toprol 12.5 mg  twice a day. Follow-up per cardiology services 10.Dysphagia. Dysphagia 2 with thin. Follow-up speech therapy  -add cepacol spray for sore throat 11.Acute diastolic congestive heart failure. Lasix 20 mg daily. Monitor for any fluid overload 12.Hypokalemia.   K+ 4.3 today  Supplement increased while on lasix   13.History of COPD. Check oxygen saturations every shift 14.History of alcohol abuse. Monitor for withdrawal. Provide counseling as appropriate 15.Constipation. Laxative assistance 16. Transaminitis:   Improving  Atorvastatin decreased to 40 on 2/26  .[id number] 17. Leukocytosis  Afebrile  WBCs 11.0 on 2/26  Labs pending for today  Will cont to monitor 18. Macrocytic anemia  Hb 10.7 on 2/26   -labs pending  Cont Vit supplementation 19. HTN  Low dose Lisinopril started 2/28  Controlled 3/4, may consider d/cing  Cont to monitor 20. Urinary retention  Bethanechol 25, increased to 50 on 3/3---need to be careful with this dose  Was improving, however has begun to retain  UA?+, Ucx neg  -continue I/O caths for now 21. LE edema  Lasix increased 3/2--continue for now  LOS (Days) 10 A FACE TO FACE EVALUATION WAS PERFORMED  Laporsche Hoeger T 12/29/2016 9:27 AM

## 2016-12-29 NOTE — Progress Notes (Signed)
Physical Therapy Session Note  Patient Details  Name: Alex DuhamelJohn R Rathod MRN: [id number] Date of Birth: [birth year]  Today's Date: 12/29/2016 PT Individual Time: 1120-1200 and [id number] PT Individual Time Calculation (min): 40 min and 60 min  Short Term Goals: Week 2:  PT Short Term Goal 1 (Week 2): Pt will negotiate 12 steps with min assist for BLE strengthening. PT Short Term Goal 3 (Week 2): Pt will demonstrate intellectual awareness with supervision.   Skilled Therapeutic Interventions/Progress Updates:  Treatment 1: Pt received in w/c & agreeable to tx. Pt without c/o pain. Transported pt to rehab gym & pt ambulated 100 ft + 100 ft with RW & steady assist fade to close supervision. Pt required max cuing for upright posture and forward gaze with improving carryover. Pt required max cuing for hand placement for transfers with poor carryover. Pt engaged in standing with RW for UE support while performing cone taps with task focusing on BLE strengthening & coordination. Pt also engaged in task focusing on fine motor use of LUE (placing plastic animals upright) with improving use as compared to last week. At end of session pt left sitting in w/c in room with QRB donned, all needs within reach, & family present. During session therapist observed pt's LUE to be weeping & RN made aware. Pt continues to require frequent sitting rest breaks 2/2 fatigue.  Treatment 2: Pt received in bed & agreeable to tx. Pt without c/o pain. Pt transferred supine>sitting EOB with Mod I & use of bed rails. Pt transferred bed>w/c via squat pivot with steady assist. In gym pt ambulated 60 ft + 20 ft + 40 ft with RW & supervision with improving forward gaze & upright posture. Pt negotiated 12 steps + 12 steps with B rails and supervision for BLE strengthening; pt required seated rest break between trials. Pt utilized nu-step up to level 3 x 10 minutes with all 4 extremities (progressing to BLE & LUE only) & task focusing on  coordination of reciprocal movements and endurance training. Pt engaged in pipe tree assembly (small set) with use of LUE for NMR; pt required significantly extra time and max cuing for pipe selection and problem solving most simple shape. At end of session pt left sitting in w/c in room with QRB donned, all needs within reach, & visitor present.   Vitals during session: HR = 58 bpm SpO2 = 100% (room air) BP = 104/64 mmHg (RUE, ted hose donned)  Therapy Documentation Precautions:  Precautions Precautions: Fall Precaution Comments: ataxic, poor safety awareness Restrictions Weight Bearing Restrictions: No   See Function Navigator for Current Functional Status.   Therapy/Group: Individual Therapy  Sandi MariscalVictoria M Miller 12/29/2016, 5:16 PM

## 2016-12-29 NOTE — Progress Notes (Signed)
BP 84/46 this afternoon. Notified Harvel Ricksan Anguilli, PA with BP trends and significant drop in the afternoons. Per Anguilli, PA urecholine to be held this afternoon and TEDs added to orders. RN and PA will continue to monitor trends

## 2016-12-29 NOTE — Progress Notes (Signed)
Occupational Therapy Weekly Progress Note  Patient Details  Name: Alex [name] MRN: [id number] Date of Birth: 1930/12/10  Beginning of progress report period: December 20, 2016 End of progress report period: December 29, 2016   Patient has met 4 of 4 short term goals.  Pt has made steady progress with BADLs aince admission.  Pt continues to fatigue quickly and requires multiple rest breaks during therapy sessions.  Pt exhibits LUE weakness/decrease coordination/proprioception but initiates use in all functional tasks.  Pt currently requires min/mod a for bathing/dressing tasks and min A for functional transfers. Pt currently exhibits behaviors consistent with Rancho level 7. Patient continues to demonstrate the following deficits: muscle weakness, decreased cardiorespiratoy endurance, L inattention, decreased initiation, decreased attention, decreased awareness, decreased problem solving, decreased safety awareness, decreased memory and delayed processing and decreased standing balance, decreased balance strategies and coordination disorder and therefore will continue to benefit from skilled OT intervention to enhance overall performance with BADL.  Patient progressing toward long term goals..  Continue plan of care.  OT Short Term Goals Week 1:  OT Short Term Goal 1 (Week 1): Pt will perform stand pivot transfer with mod A +1 to BSC/ toilet OT Short Term Goal 1 - Progress (Week 1): Met OT Short Term Goal 2 (Week 1): Pt will perform sit to stand for clothing management with  mod A  OT Short Term Goal 2 - Progress (Week 1): Met OT Short Term Goal 3 (Week 1): Pt will perform bed mobility in prep for ADL task with mod A with bed rails and extra time OT Short Term Goal 3 - Progress (Week 1): Met OT Short Term Goal 4 (Week 1): Pt will be oriented x4 with mod external cues OT Short Term Goal 4 - Progress (Week 1): Met Week 2:  OT Short Term Goal 1 (Week 2): Pt will perform toileting tasks with steady  A OT Short Term Goal 2 (Week 2): Pt will perform bathing tasks with min A at shower level OT Short Term Goal 3 (Week 2): Pt will perform LB dressing tasks with mod A OT Short Term Goal 4 (Week 2): Pt will perform grooming tasks standing at sink with steady a       Therapy Documentation Precautions:  Precautions Precautions: Fall Precaution Comments: ataxic, poor safety awareness Restrictions Weight Bearing Restrictions: No  See Function Navigator for Current Functional Status.    Alex [name] [facility] 12/29/2016, 6:58 AM

## 2016-12-29 NOTE — Progress Notes (Signed)
Occupational Therapy Note  Patient Details  Name: Alex [name] MRN: [id number] Date of Birth: May 10, 1931  Today's Date: 12/29/2016 OT Individual Time: 1400-1430 OT Individual Time Calculation (min): 30 min   Pt denied pain Individual Therapy  Pt resting in bed upon arrival.  Most recent BP (1357) at 84/46. Pt remained in bed for bed mobility tasks and LUE therapeutic activities [facility].  TED hose donned per RN and LUE wrapped with Curlex 2/2 ongoing weeping.  Pt remained in bed with all needs within reach.    Lavone NeriLanier, Deklin Bieler [facility] WestChappell 12/29/2016, 2:44 PM

## 2016-12-29 NOTE — Progress Notes (Signed)
Speech Language Pathology Daily Session Note  Patient Details  Name: Alex DuhamelJohn R [name] MRN: [id number] Date of Birth: 03/08/1931  Today's Date: 12/29/2016 SLP Individual Time: 0725-0825 SLP Individual Time Calculation (min): 60 min  Short Term Goals: Week 2: SLP Short Term Goal 1 (Week 2): Pt will demonstrate functional problem solving of basic, famaliar tasks with Min A verbal cues.  SLP Short Term Goal 2 (Week 2): Pt will answer orientation questions with Mod A multimodal cues.  SLP Short Term Goal 3 (Week 2): Pt will sustain attention for ~15 minutes with Mod A verbal cues for redirection to tasks. SLP Short Term Goal 4 (Week 2): Pt will consume trials of dysphagia 3 textures with minimal overt s/s of aspiration and Min A verbal cues for complete oral clearing over 2 session prior to upgrade.  SLP Short Term Goal 5 (Week 2): Patient will consume current diet with minimal overt s/s of aspiration with Supervision verbal cues.   Skilled Therapeutic Interventions: Skilled treatment session focused on dysphagia and cognitive goals. Upon arrival, patient was asleep in bed but easily awakened. SLP facilitated session by providing tray set-up and supervision verbal cues for use of swallowing compensatory strategies with breakfast meal of Dys. 2 textures with thin liquids. Patient consumed meal without overt s/s of aspiration. Patient required Max A verbal cues for orientation to date but demonstrated alternating attention between self-feeding and functional conversation for ~30 minutes with Mod I. Patient left upright in bed with all needs within reach. Continue with current plan of care.      Function:  Eating Eating   Modified Consistency Diet: Yes Eating Assist Level: Set up assist for;Supervision or verbal cues   Eating Set Up Assist For: Opening containers;Applying device (includes dentures)       Cognition Comprehension Comprehension assist level: Follows basic conversation/direction with  no assist  Expression   Expression assist level: Expresses basic needs/ideas: With no assist  Social Interaction Social Interaction assist level: Interacts appropriately 90% of the time - Needs monitoring or encouragement for participation or interaction.  Problem Solving Problem solving assist level: Solves basic 50 - 74% of the time/requires cueing 25 - 49% of the time  Memory Memory assist level: Recognizes or recalls 50 - 74% of the time/requires cueing 25 - 49% of the time    Pain Pain Assessment Pain Assessment: No/denies pain  Therapy/Group: Individual Therapy  Alex [name] 12/29/2016, 9:40 AM

## 2016-12-29 NOTE — Progress Notes (Signed)
Occupational Therapy Session Note  Patient Details  Name: Alex DuhamelJohn R [name] MRN: [id number] Date of Birth: 02-Apr-1931  Today's Date: 12/29/2016 OT Individual Time: 0930-1030 OT Individual Time Calculation (min): 60 min    Short Term Goals: Week 2:  OT Short Term Goal 1 (Week 2): Pt will perform toileting tasks with steady A OT Short Term Goal 2 (Week 2): Pt will perform bathing tasks with min A at shower level OT Short Term Goal 3 (Week 2): Pt will perform LB dressing tasks with mod A OT Short Term Goal 4 (Week 2): Pt will perform grooming tasks standing at sink with steady a  Skilled Therapeutic Interventions/Progress Updates:    Pt resting in bed upon arrival with wife present.  Pt engaged in BADL retraining including bathing and dressing with sit<>stand from w/c at sink.  Pt amb with RW to w/c at sink to complete bathing and dressing tasks.  Pt continues to fatigue quickly and requires multiple rest breaks throughout session.  Pt required assistance with threading underpants and pants.  Pt required assistance with orientation of LB clothing but was able to orient shirt correctly prior to donning.  Pt was able to don socks this morning.  Pt continues to exhibit LUE weakness and decreased coordination.  Focus on activity tolerance, sit<>stand, standing balance, functional amb with RW, LUE use/NMR, and safety awareness to increase independence with BADLs.  Pt remained in w/c with QRB in place and all needs within reach.   Therapy Documentation Precautions:  Precautions Precautions: Fall Precaution Comments: ataxic, poor safety awareness Restrictions Weight Bearing Restrictions: No   Pain: Pain Assessment Pain Assessment: No/denies pain  See Function Navigator for Current Functional Status.   Therapy/Group: Individual Therapy  Rich BraveLanier, Jarely Juncaj Chappell 12/29/2016, 10:33 AM

## 2016-12-30 ENCOUNTER — Inpatient Hospital Stay (HOSPITAL_COMMUNITY): Payer: Medicare Other | Admitting: Speech Pathology

## 2016-12-30 ENCOUNTER — Inpatient Hospital Stay (HOSPITAL_COMMUNITY): Payer: Medicare Other | Admitting: Physical Therapy

## 2016-12-30 ENCOUNTER — Inpatient Hospital Stay (HOSPITAL_COMMUNITY): Payer: Medicare Other | Admitting: Occupational Therapy

## 2016-12-30 ENCOUNTER — Inpatient Hospital Stay (HOSPITAL_COMMUNITY): Payer: Medicare Other

## 2016-12-30 MED ORDER — FUROSEMIDE 20 MG PO TABS
20.0000 mg | ORAL_TABLET | Freq: Every day | ORAL | Status: DC
  Administered 2016-12-31 – 2017-01-10 (×11): 20 mg via ORAL
  Filled 2016-12-30 (×11): qty 1

## 2016-12-30 NOTE — Progress Notes (Addendum)
Physical Therapy Session Note  Patient Details  Name: Alex DuhamelJohn R [name] MRN: [id number] Date of Birth: [birth year]  Today's Date: 12/30/2016 PT Individual Time: 1106-1202 PT Individual Time Calculation (min): 56 min   Short Term Goals: Week 2:  PT Short Term Goal 1 (Week 2): Pt will negotiate 12 steps with min assist for BLE strengthening. PT Short Term Goal 3 (Week 2): Pt will demonstrate intellectual awareness with supervision.   Skilled Therapeutic Interventions/Progress Updates:  Pt received in w/c & agreeable to tx. Pt consuming frosty friend had brought him; educated pt on need to have full supervision from nursing staff when eating/drinking and also discussed potential for family members to get checked off by SLP. Pt consumed smoothie during session with supervision from therapist. Pt utilized cybex kinetron in sitting for BLE strengthening, with rest breaks as needed 2/2 fatigue. Pt stood without BUE support & close supervision/steady assist for 2 minutes at a time (x 3 trials) while bowling. Pt able to perform mini squats to toss ball. Therapist observed pt's L UE to be weeping again & therapist wrapped forearm with Curlex; RN made aware. Pt utilized nu-step up to level 4 x 6 minutes with BLE & LUE with task focusing on L NMR, strengthening, and endurance training; pt reported 13 on Borg RPE scale. At end of session pt left sitting in w/c in room with all needs within reach and QRB donned.  Therapy Documentation Precautions:  Precautions Precautions: Fall Precaution Comments: ataxic, poor safety awareness Restrictions Weight Bearing Restrictions: No  Pain: "Not really. Not enough to complain."   See Function Navigator for Current Functional Status.   Therapy/Group: Individual Therapy  Sandi MariscalVictoria M Vikas Wegmann 12/30/2016, 12:07 PM

## 2016-12-30 NOTE — Progress Notes (Signed)
Occupational Therapy Session Note  Patient Details  Name: Alex DuhamelJohn R Cantin MRN: [id number] Date of Birth: [birth year]  Today's Date: 12/30/2016 OT Individual Time: 0930-1030 OT Individual Time Calculation (min): 60 min  and Today's Date: 12/30/2016 OT Missed Time: 30 Minutes Missed Time Reason: Patient fatigue   Short Term Goals: Week 2:  OT Short Term Goal 1 (Week 2): Pt will perform toileting tasks with steady A OT Short Term Goal 2 (Week 2): Pt will perform bathing tasks with min A at shower level OT Short Term Goal 3 (Week 2): Pt will perform LB dressing tasks with mod A OT Short Term Goal 4 (Week 2): Pt will perform grooming tasks standing at sink with steady a  Skilled Therapeutic Interventions/Progress Updates:    Pt engaged in BADL retraining including bathing at shower level and dressing with sit<>stand from w/c at sink.  Pt amb with RW to bathroom and transferred to tub bench in walk-in shower.  Pt completes bathing tasks at steady A for standing.  Pt amb back into room and completed dressing tasks.  Pt required assistance with orientation of shirt and pants this morning.  Pt donned shirt at supervision level after correct orientation.  Pt fatigues easily and requires rest breaks between each segment of dressing activity.  Pt performs sit<>stand at supervision level but continues to require steady A when standing. Pt continues to exhibit LUE weakness and decreased gross motor and FMC.  Pt remained in w/c with QRB in place.  Session ended early to allow pt to rest before PT session.Focus on activity tolerance, sit<>stand, standing balance, functional amb with RW, LUE NMR, and safety awareness to increase independence with BADLs.    Therapy Documentation Precautions:  Precautions Precautions: Fall Precaution Comments: ataxic, poor safety awareness Restrictions Weight Bearing Restrictions: No General: General OT Amount of Missed Time: 30 Minutes Pain:  Pt denied pain  See Function  Navigator for Current Functional Status.   Therapy/Group: Individual Therapy  Rich BraveLanier, Quantay Zaremba Chappell 12/30/2016, 10:54 AM

## 2016-12-30 NOTE — Progress Notes (Signed)
Occupational Therapy Session Note  Patient Details  Name: Alex DuhamelJohn R [name] MRN: [id number] Date of Birth: June 17, 1931  Today's Date: 12/30/2016 OT Individual Time: 1445-1515 OT Individual Time Calculation (min): 30 min    Short Term Goals: Week 2:  OT Short Term Goal 1 (Week 2): Pt will perform toileting tasks with steady A OT Short Term Goal 2 (Week 2): Pt will perform bathing tasks with min A at shower level OT Short Term Goal 3 (Week 2): Pt will perform LB dressing tasks with mod A OT Short Term Goal 4 (Week 2): Pt will perform grooming tasks standing at sink with steady a  Skilled Therapeutic Interventions/Progress Updates:    Upon entering the room, pt reports increased fatigue from prior therapy sessions. Pt agreeable to seated exercises to address fine motor coordination. Family member present in room for observation this session. OT demonstrated exercises with use of tan, soft theraputty. Pt returning demonstrations with use of L UE for tasks. Pt also flattening putty on table in order to locate 10 beads within the putty. Pt needing increased time to pull from putty and having 2/10 drops with L hand. Pt remained in wheelchair at end of session with call bell and all needed items within reach.   Therapy Documentation Precautions:  Precautions Precautions: Fall Precaution Comments: ataxic, poor safety awareness Restrictions Weight Bearing Restrictions: No General:   Vital Signs: Therapy Vitals Temp: 97.7 F (36.5 C) Temp Source: Oral Pulse Rate: 60 Resp: 18 BP: 92/90 Patient Position (if appropriate): Sitting Oxygen Therapy SpO2: 99 % O2 Device: Not Delivered Pain:   ADL:   Exercises:   Other Treatments:    See Function Navigator for Current Functional Status.   Therapy/Group: Individual Therapy  Alen BleacherBradsher, Carling Liberman P 12/30/2016, 4:39 PM

## 2016-12-30 NOTE — Progress Notes (Signed)
Speech Language Pathology Daily Session Notes  Patient Details  Name: Alex DuhamelJohn R [name] MRN: [id number] Date of Birth: 1931-09-26  Today's Date: 12/30/2016  Session 1: SLP Individual Time: [id number] SLP Individual Time Calculation (min): 60 min    Session 2: SLP Individual Time: 1350-1430 SLP Individual Time Calculation (min): 40 min  Short Term Goals: Week 2: SLP Short Term Goal 1 (Week 2): Pt will demonstrate functional problem solving of basic, famaliar tasks with Min A verbal cues.  SLP Short Term Goal 2 (Week 2): Pt will answer orientation questions with Mod A multimodal cues.  SLP Short Term Goal 3 (Week 2): Pt will sustain attention for ~15 minutes with Mod A verbal cues for redirection to tasks. SLP Short Term Goal 4 (Week 2): Pt will consume trials of dysphagia 3 textures with minimal overt s/s of aspiration and Min A verbal cues for complete oral clearing over 2 session prior to upgrade.  SLP Short Term Goal 5 (Week 2): Patient will consume current diet with minimal overt s/s of aspiration with Supervision verbal cues.   Skilled Therapeutic Interventions:  Session 1: Skilled treatment session focused on dysphagia and cognitive goals. SLP facilitated session by providing tray set-up with breakfast meal of Dys. 2 textures with thin liquids. Patient consumed meal without overt s/s of aspiration and was Mod I for use of swallowing compensatory strategies. Patient demonstrated selective attention to self-feeding in a mildly distracting environment for ~30 minutes with supervision verbal cues and required Min A verbal cues for problem solving during a basic money management task. Patient left upright in bed with all needs within reach. Continue with current plan of care.   Session 2: Skilled treatment session focused on cognitive goals. SLP facilitated session by providing Max A verbal cues for recall of his current medications and their functions and Mod A verbal cues with extra time for  problem solving during a mildly complex task of organizing a BID pill box. Patient left upright in wheelchair with quick release belt in place and all needs within reach. Continue with current plan of care.   Function:  Eating Eating   Modified Consistency Diet: Yes Eating Assist Level: Set up assist for   Eating Set Up Assist For: Opening containers;Cutting food;Applying device (includes dentures) Helper Scoops Food on Utensil: Occasionally     Cognition Comprehension Comprehension assist level: Understands basic 90% of the time/cues < 10% of the time  Expression   Expression assist level: Expresses basic 90% of the time/requires cueing < 10% of the time.  Social Interaction Social Interaction assist level: Interacts appropriately 90% of the time - Needs monitoring or encouragement for participation or interaction.  Problem Solving Problem solving assist level: Solves basic 75 - 89% of the time/requires cueing 10 - 24% of the time  Memory Memory assist level: Recognizes or recalls 75 - 89% of the time/requires cueing 10 - 24% of the time    Pain No/Denies Pain   Therapy/Group: Individual Therapy  Liana Camerer 12/30/2016, 3:03 PM

## 2016-12-30 NOTE — Progress Notes (Signed)
Physical Therapy Session Note  Patient Details  Name: Alex [name] MRN: [id number] Date of Birth: March 27, 1931  Today's Date: 12/30/2016 PT Individual Time: 1224-8250 PT Individual Time Calculation (min): 25 min   Short Term Goals: Week 2:  PT Short Term Goal 1 (Week 2): Pt will negotiate 12 steps with min assist for BLE strengthening. PT Short Term Goal 3 (Week 2): Pt will demonstrate intellectual awareness with supervision.   Skilled Therapeutic Interventions/Progress Updates:    no c/o pain.  Agreeable to therapy session focus on activity tolerance and gait.  Pt transfers sit<>Stand and squat/pivot throughout session with steady assist.  Gait to therapy gym with Rw and min assist.  Nustep x11 minutes focus on reciprocal stepping pattern, activity tolerance, and attention to task.  Pt requires min cues to attend to clock and surpassed 10 minute mark.  Pt returned to room in w/c at end of session and left upright with call bell in reach and needs met.   Therapy Documentation Precautions:  Precautions Precautions: Fall Precaution Comments: ataxic, poor safety awareness Restrictions Weight Bearing Restrictions: No   See Function Navigator for Current Functional Status.   Therapy/Group: Individual Therapy  Earnest Conroy Penven-Crew 12/30/2016, 4:58 PM

## 2016-12-30 NOTE — Progress Notes (Signed)
[facility] PH[facility] MEDICINE & REHABILITATION     PROGRESS NOTE  Subjective/Complaints: still requiring caths. States he emptied some on his own although flowsheet does not indicate that.   ROS: pt denies nausea, vomiting, diarrhea, cough, shortness of breath or chest pain     Objective: Vital Signs: Blood pressure 118/77, pulse 61, temperature 97.8 F (36.6 C), temperature source Oral, resp. rate 18, weight 99.4 kg (219 lb 2.2 oz), SpO2 98 %. No results found.  Recent Labs  12/29/16 0925  WBC 18.4*  HGB 11.9*  HCT 36.2*  PLT 145*    Recent Labs  12/29/16 0605  NA 135  K 4.3  CL 100*  GLUCOSE 136*  BUN 19  CREATININE 0.79  CALCIUM 8.1*   CBG (last 3)  No results for input(s): GLUCAP in the last 72 hours.  Wt Readings from Last 3 Encounters:  12/29/16 99.4 kg (219 lb 2.2 oz)  12/16/16 103.2 kg (227 lb 8.2 oz)  12/15/16 105.2 kg (232 lb)    Physical Exam:  BP 118/77 (BP Location: Right Arm)   Pulse 61   Temp 97.8 F (36.6 C) (Oral)   Resp 18   Wt 99.4 kg (219 lb 2.2 oz)   SpO2 98%   BMI 28.14 kg/m  Constitutional: NAD. Well-developed. Vital signs reviewed.  Mouth-0ral mucosa pink/moist HENT: Staples out/ wound intact Eyes: EOMI. No discharge.  Cardiovascular: IRRR, no jvd Respiratory: cta, normal effort GI: Soft. Bowel sounds are normal.  Musc: No tenderness. No LE edema Neurological.Alert and oriented x2/3 (knew month).  Motor: B/l UE 4/5 prox to distal.  B/l LE: 4+/5 proximal to distal (stable). Skin: Warm and dry.   Psych: pleasant and cooperative.    Assessment/Plan: 1. Functional deficits secondary to SDH sp craniotomy which require 3+ hours per day of interdisciplinary therapy in a comprehensive inpatient rehab setting. Physiatrist is providing close team supervision and 24 hour management of active medical problems listed below. Physiatrist and rehab team continue to assess barriers to discharge/monitor patient progress toward functional  and medical goals.  Function:  Bathing Bathing position Bathing activity did not occur: Refused Position: Wheelchair/chair at sink  Bathing parts Body parts bathed by patient: Right arm, Left arm, Chest, Abdomen, Front perineal area, Right upper leg, Left upper leg, Right lower leg, Left lower leg Body parts bathed by helper: Buttocks  Bathing assist Assist Level: Touching or steadying assistance(Pt > 75%)      Upper Body Dressing/Undressing Upper body dressing Upper body dressing/undressing activity did not occur: Refused What is the patient wearing?: Pull over shirt/dress     Pull over shirt/dress - Perfomed by patient: Thread/unthread right sleeve, Pull shirt over trunk, Put head through opening, Thread/unthread left sleeve Pull over shirt/dress - Perfomed by helper: Thread/unthread left sleeve        Upper body assist Assist Level: Supervision or verbal cues      Lower Body Dressing/Undressing Lower body dressing   What is the patient wearing?: Underwear, Pants, Non-skid slipper socks Underwear - Performed by patient: Thread/unthread right underwear leg, Pull underwear up/down Underwear - Performed by helper: Thread/unthread left underwear leg Pants- Performed by patient: Thread/unthread right pants leg, Pull pants up/down Pants- Performed by helper: Thread/unthread left pants leg Non-skid slipper socks- Performed by patient: Don/doff right sock, Don/doff left sock Non-skid slipper socks- Performed by helper: Don/doff right sock, Don/doff left sock   Socks - Performed by helper: Don/doff right sock  Lower body assist Assist for lower body dressing: Touching or steadying assistance (Pt > 75%)      Toileting Toileting Toileting activity did not occur: No continent bowel/bladder event Toileting steps completed by patient: Performs perineal hygiene Toileting steps completed by helper: Adjust clothing prior to toileting, Adjust clothing after  toileting Toileting Assistive Devices: Grab bar or rail  Toileting assist Assist level: Touching or steadying assistance (Pt.75%)   Transfers Chair/bed transfer   Chair/bed transfer method: Squat pivot Chair/bed transfer assist level: Touching or steadying assistance (Pt > 75%) Chair/bed transfer assistive device: Armrests     Locomotion Ambulation     Max distance: 60 ft Assist level: Supervision or verbal cues   Wheelchair   Type: Manual Max wheelchair distance: 70 Assist Level: Supervision or verbal cues  Cognition Comprehension Comprehension assist level: Understands basic 90% of the time/cues < 10% of the time  Expression Expression assist level: Expresses basic 90% of the time/requires cueing < 10% of the time.  Social Interaction Social Interaction assist level: Interacts appropriately 90% of the time - Needs monitoring or encouragement for participation or interaction.  Problem Solving Problem solving assist level: Solves basic 90% of the time/requires cueing < 10% of the time  Memory Memory assist level: Recognizes or recalls 90% of the time/requires cueing < 10% of the time    Medical Problem List and Plan: 1. 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 DVT Prophylaxis/Anticoagulation: SCDs. Monitor for any signs of DVT 3. Pain Management: Hydrocodone as needed 4. Mood: Xanax 0.25 mg daily at bedtime,Lexapro 10 mg daily 5. Neuropsych: This patient is notcapable of making decisions on hisown behalf. 6. Skin/Wound Care: Removed staples 7. Fluids/Electrolytes/Nutrition: Routine I&Os  -encourage PO 8.Seizure prophylaxis. Keppra 500 mg every 12 hours 9.Atrial fibrillation/AVR/CABG/PPM. Continue low-dose aspirin. Amiodarone 200 mg twice a day, Toprol 12.5 mg twice a day. Follow-up per cardiology  services 10.Dysphagia. Dysphagia 2 with thin. Follow-up speech therapy  -add cepacol spray for sore throat 11.Acute diastolic congestive heart failure. Lasix  daily. Monitor for any fluid overload  -weight down---can back off lasix 12.Hypokalemia.   K+ 4.3 today  Supplement increased while on lasix   13.History of COPD. Check oxygen saturations every shift 14.History of alcohol abuse. Monitor for withdrawal. Provide counseling as appropriate 15.Constipation. Laxative assistance 16. Transaminitis:   Improving  Atorvastatin decreased to 40 on 2/26  .ztsalb 17. Leukocytosis  Afebrile, likely steroid induced---weaning steroids now  WBCs up to 18.4 3/5   -recheck wednesday  Will cont to monitor 18. Macrocytic anemia  Hb 11.0 3/5     Cont Vit supplementation 19. HTN  Low dose Lisinopril started 2/28  Controlled 3/4, may consider d/cing  Cont to monitor 20. Urinary retention  Bethanechol 25, increased to 50 on 3/3---need to be careful with this dose  Still retaining  UA?+, Ucx neg  -continue I/O caths for now 21. LE edema  Lasix increased 3/2--reduce back to 20mg  daily for now  LOS (Days) 11 A FACE TO FACE EVALUATION WAS PERFORMED  Alex [name] T 12/30/2016 9:17 AM

## 2016-12-31 ENCOUNTER — Inpatient Hospital Stay (HOSPITAL_COMMUNITY): Payer: Medicare Other

## 2016-12-31 ENCOUNTER — Inpatient Hospital Stay (HOSPITAL_COMMUNITY): Payer: Medicare Other | Admitting: Physical Therapy

## 2016-12-31 ENCOUNTER — Inpatient Hospital Stay (HOSPITAL_COMMUNITY): Payer: Medicare Other | Admitting: Speech Pathology

## 2016-12-31 LAB — BASIC METABOLIC PANEL
Anion gap: 5 (ref 5–15)
BUN: 27 mg/dL — AB (ref 6–20)
CALCIUM: 8 mg/dL — AB (ref 8.9–10.3)
CO2: 31 mmol/L (ref 22–32)
CREATININE: 0.82 mg/dL (ref 0.61–1.24)
Chloride: 98 mmol/L — ABNORMAL LOW (ref 101–111)
GFR calc Af Amer: >60 mL/min (ref >60)
Glucose, Bld: 165 mg/dL — ABNORMAL HIGH (ref 65–99)
Potassium: 4.4 mmol/L (ref 3.5–5.1)
SODIUM: 134 mmol/L — AB (ref 135–145)

## 2016-12-31 NOTE — Progress Notes (Signed)
[place] PHYSICAL MEDICINE & REHABILITATION     PROGRESS NOTE  Subjective/Complaints:  Emptied bladder overnight/this morning. Happy about that!  ROS: pt denies nausea, vomiting, diarrhea, cough, shortness of breath or chest pain     Objective: Vital Signs: Blood pressure (!) 87/46, pulse 64, temperature 97.7 F (36.5 C), temperature source Oral, resp. rate 18, weight 99.4 kg (219 lb 2.2 oz), SpO2 99 %. No results found.  Recent Labs  12/29/16 0925  WBC 18.4*  HGB 11.9*  HCT 36.2*  PLT 145*    Recent Labs  12/29/16 0605 12/31/16 0614  NA 135 134*  K 4.3 4.4  CL 100* 98*  GLUCOSE 136* 165*  BUN 19 27*  CREATININE 0.79 0.82  CALCIUM 8.1* 8.0*   CBG (last 3)  No results for input(s): GLUCAP in the last 72 hours.  Wt Readings from Last 3 Encounters:  12/29/16 99.4 kg (219 lb 2.2 oz)  12/16/16 103.2 kg (227 lb 8.2 oz)  12/15/16 105.2 kg (232 lb)    Physical Exam:  BP (!) 87/46 (BP Location: Left Arm)   Pulse 64   Temp 97.7 F (36.5 C) (Oral)   Resp 18   Wt 99.4 kg (219 lb 2.2 oz)   SpO2 99%   BMI 28.14 kg/m  Constitutional: NAD. Well-developed. Vital signs reviewed.  Mouth-0ral mucosa pink/moist HENT: scalp incision clean and intact Eyes: EOMI. No discharge.  Cardiovascular: IRR IRR. No jvd Respiratory: CTA B GI: Soft. Bowel sounds are normal.  Musc: No tenderness. No edema Neurological.Alert and oriented x2/3 (knew month/year). Recalled [place] at obx Motor: B/l UE 4/5 prox to distal.  B/l LE: 4+/5 proximal to distal (stable). Skin: Warm and dry.   Psych: pleasant and cooperative.    Assessment/Plan: 1. Functional deficits secondary to SDH sp craniotomy which require 3+ hours per day of interdisciplinary therapy in a comprehensive inpatient rehab setting. Physiatrist is providing close team supervision and 24 hour management of active medical problems listed below. Physiatrist and rehab team continue to assess barriers to  discharge/monitor patient progress toward functional and medical goals.  Function:  Bathing Bathing position Bathing activity did not occur: Refused Position: Systems developer parts bathed by patient: Right arm, Left arm, Chest, Abdomen, Front perineal area, Right upper leg, Left upper leg, Right lower leg, Buttocks, Left lower leg Body parts bathed by helper: Buttocks  Bathing assist Assist Level: Touching or steadying assistance(Pt > 75%)      Upper Body Dressing/Undressing Upper body dressing Upper body dressing/undressing activity did not occur: Refused What is the patient wearing?: Pull over shirt/dress     Pull over shirt/dress - Perfomed by patient: Thread/unthread right sleeve, Pull shirt over trunk, Put head through opening, Thread/unthread left sleeve Pull over shirt/dress - Perfomed by helper: Thread/unthread left sleeve        Upper body assist Assist Level: Supervision or verbal cues      Lower Body Dressing/Undressing Lower body dressing   What is the patient wearing?: Underwear, Pants, Ted Hose, Non-skid slipper socks Underwear - Performed by patient: Pull underwear up/down, Thread/unthread right underwear leg, Thread/unthread left underwear leg Underwear - Performed by helper: Thread/unthread left underwear leg Pants- Performed by patient: Pull pants up/down Pants- Performed by helper: Thread/unthread right pants leg, Thread/unthread left pants leg Non-skid slipper socks- Performed by patient: Don/doff left sock, Don/doff right sock Non-skid slipper socks- Performed by helper: Don/doff right sock, Don/doff left sock   Socks - Performed by helper:  Don/doff right sock              Lower body assist Assist for lower body dressing: Touching or steadying assistance (Pt > 75%)      Toileting Toileting Toileting activity did not occur: No continent bowel/bladder event Toileting steps completed by patient: Performs perineal hygiene Toileting steps  completed by helper: Adjust clothing prior to toileting, Adjust clothing after toileting Toileting Assistive Devices: Grab bar or rail  Toileting assist Assist level: Touching or steadying assistance (Pt.75%)   Transfers Chair/bed transfer   Chair/bed transfer method: Stand pivot Chair/bed transfer assist level: Touching or steadying assistance (Pt > 75%) Chair/bed transfer assistive device: Armrests     Locomotion Ambulation     Max distance: 60 ft Assist level: Supervision or verbal cues   Wheelchair   Type: Manual Max wheelchair distance: 70 Assist Level: Supervision or verbal cues  Cognition Comprehension Comprehension assist level: Understands basic 90% of the time/cues < 10% of the time  Expression Expression assist level: Expresses basic 90% of the time/requires cueing < 10% of the time.  Social Interaction Social Interaction assist level: Interacts appropriately 90% of the time - Needs monitoring or encouragement for participation or interaction.  Problem Solving Problem solving assist level: Solves basic 75 - 89% of the time/requires cueing 10 - 24% of the time  Memory Memory assist level: Recognizes or recalls 75 - 89% of the time/requires cueing 10 - 24% of the time    Medical Problem List and Plan: 1. Decreased functional mobility with altered mental statussecondary to traumatic subdural hematoma status post right frontotemporal parietal craniotomy 12/16/2016  Cont CIR--making gains in regard to cognition. Still with poor stamina   2. DVT Prophylaxis/Anticoagulation: SCDs. Monitor for any signs of DVT 3. Pain Management: Hydrocodone as needed 4. Mood: Xanax 0.25 mg daily at bedtime,Lexapro 10 mg daily 5. Neuropsych: This patient is notcapable of making decisions on hisown behalf. 6. Skin/Wound Care: Removed staples 7. Fluids/Electrolytes/Nutrition: Routine I&Os  -encourage PO 8.Seizure prophylaxis. Keppra 500 mg every 12 hours 9.Atrial  fibrillation/AVR/CABG/PPM. Continue low-dose aspirin. Amiodarone 200 mg twice a day, Toprol 12.5 mg twice a day. Follow-up per cardiology services 10.Dysphagia. Dysphagia 2 with thin. Follow-up speech therapy  -added cepacol spray for sore throat 11.Acute diastolic congestive heart failure. Lasix  daily. Monitor for any fluid overload  -weights are down---lasix decreased back to 20mg  12.Hypokalemia.   K+ 4.4 today  Continue supplement  -I personally reviewed the patient's labs today.   13.History of COPD. Check oxygen saturations every shift 14.History of alcohol abuse. Monitor for withdrawal. Provide counseling as appropriate 15.Constipation. Laxative assistance 16. Transaminitis:   Improving  Atorvastatin decreased to 40 on 2/26  .ztsalb 17. Leukocytosis  Afebrile, likely steroid induced---weaning steroids now  WBCs up to 18.4 3/5   -today's cbc pending  Will cont to monitor 18. Macrocytic anemia  Hb 11.0 3/5     Cont Vit supplementation 19. HTN  Low dose Lisinopril started 2/28--dc d/t low bp's   Cont to monitor 20. Urinary retention  Bethanechol   50 mg tid---need to be careful with this dose---wean as soon as possible  UA?+, Ucx neg  -continue I/O caths prn  -make sure patient is on commode or eob to empty 21. LE edema--improved  Lasix increased reduced back to 20mg  daily for now  LOS (Days) 12 A FACE TO FACE EVALUATION WAS PERFORMED  SWARTZ,ZACHARY T 12/31/2016 9:11 AM

## 2016-12-31 NOTE — Progress Notes (Signed)
Occupational Therapy Session Note  Patient Details  Name: Jetty DuhamelJohn R First MRN: [id number] Date of Birth: [birth year]  Today's Date: 12/31/2016 OT Individual Time: 0900-1000 OT Individual Time Calculation (min): 60 min    Short Term Goals: Week 2:  OT Short Term Goal 1 (Week 2): Pt will perform toileting tasks with steady A OT Short Term Goal 2 (Week 2): Pt will perform bathing tasks with min A at shower level OT Short Term Goal 3 (Week 2): Pt will perform LB dressing tasks with mod A OT Short Term Goal 4 (Week 2): Pt will perform grooming tasks standing at sink with steady a  Skilled Therapeutic Interventions/Progress Updates:    Pt resting in bed upon arrival with wife present.  Pt engaged in BADL retraining including bathing/dressing with sit<>stand from w/c at sink.  Pt sat EOB and amb with RW to w/c at sink (supervision). Pt completed bathing/dressing tasks with steady A when standing at sink.  Pt continues to exhibit decreased LUE coordination/ataxia.  Pt requires assistance with orientation of clothing items prior to donning.  Pt continues to fatigue easily and requires more than a reasonable amount of time to complete tasks with multiple rest breaks.    Therapy Documentation Precautions:  Precautions Precautions: Fall Precaution Comments: ataxic, poor safety awareness Restrictions Weight Bearing Restrictions: No Pain:  Pt denied pain  See Function Navigator for Current Functional Status.   Therapy/Group: Individual Therapy  Rich BraveLanier, Cathern Tahir Chappell 12/31/2016, 11:06 AM

## 2016-12-31 NOTE — Patient Care Conference (Signed)
Inpatient RehabilitationTeam Conference and Plan of Care Update Date: 12/30/2016   Time: 2:50 PM    Patient Name: Alex [name]      Medical Record Number: [id number]  Date of Birth: Mar 25, 1931 Sex: Male         Room/Bed: 4W13C/4W13C-01 Payor Info: Payor: [employer] MEDICARE / Plan: [employer] MEDICARE / Product Type: *No Product type* /    Admitting Diagnosis: SDH  Admit Date/Time:  12/19/2016  6:18 PM Admission Comments: No comment available   Primary Diagnosis:  Traumatic brain injury with loss of consciousness of 31 minutes to 59 minutes (HCC) Principal Problem: Traumatic brain injury with loss of consciousness of 31 minutes to 59 minutes [facility](HCC)  Patient Active Problem List   Diagnosis Date Noted  . Peripheral edema   . Abnormal urinalysis   . Urinary retention   . Hypertension   . Reactive hypertension   . Transaminitis   . Traumatic brain injury with loss of consciousness of 31 minutes to 59 minutes (HCC) 12/19/2016  . Acute pulmonary edema (HCC) 12/12/2016  . Acute diastolic CHF (congestive heart failure) (HCC) 12/12/2016  . Atrial flutter (HCC) 12/12/2016  . Traumatic subdural bleed with LOC of 1 hour to 5 hours 59 minutes (HCC) 12/11/2016  . Class 1 obesity due to excess calories with body mass index (BMI) of 30.0 to 30.9 in adult   . ETOH abuse   . Chronic obstructive pulmonary disease (HCC)   . Supplemental oxygen dependent   . Alcohol use   . OSA on CPAP   . Atrial fibrillation (HCC)   . Coronary artery disease involving coronary bypass graft of native heart without angina pectoris   . Seizures (HCC)   . Dysphagia   . Bradycardia   . Hyperglycemia   . Agitation   . Hypokalemia   . Hypernatremia   . Leukocytosis   . Macrocytic anemia   . Thrombocytopenia (HCC)   . Traumatic subdural hematoma without loss of consciousness (HCC)   . Change in mental status   . Subdural hematoma (HCC) 12/03/2016  . Sick sinus syndrome (HCC) 11/06/2015  . PAF (paroxysmal atrial  fibrillation) (HCC) 11/14/2014  . SOB (shortness of breath) 10/31/2014  . Persistent atrial fibrillation (HCC) 10/31/2014  . Aortic stenosis, severe 07/28/2011  . 3-vessel coronary artery disease 07/28/2011  . Hyperlipidemia, mixed 07/28/2011    Expected Discharge Date: Expected Discharge Date: 01/10/17  Team Members Present: Physician leading conference: Dr. Faith RogueZachary Swartz Social Worker Present: Amada JupiterLucy Nobie Alleyne, LCSW Nurse Present: Chana Bodeeborah Sharp, RN PT Present: Aleda GranaVictoria Miller, PT OT Present: Roney MansJennifer Smith, OT;Ardis Rowanom Lanier, COTA SLP Present: Reuel DerbyHappi Overton, SLP PPS Coordinator present : Tora DuckMarie Noel, RN, CRRN     Current Status/Progress Goal Weekly Team Focus  Medical   improved cognitive awareness. volume balanced, now unable to void ---req i/o caths  improve bladder functiona and safety awareness  bladder emptying, fluid nad electrolyte management, wound care   Bowel/Bladder   I&O cath q 6hrs-coude cath. Significant pain regardless of lidocaine. Incont of bowel at times. can be cont. 3-5 LBM  manage bowel and bladder max assist- I&O cath from caregiver and pt  assess bowel plan. Timed tolieting. educate on I&O caths   Swallow/Nutrition/ Hydration   Dys. 2 textures with thin liquids, supervision-Mod I   Min A with least restrictive diet  trials of upgraded textures    ADL's   bating-min A; UB dressing-supervision; LB dressing-mod A; functional transfers-steady A; toileting-min A; decreased activity tolerance; LUE weakness and  decreased FMC, [place] Level VII  supervision overall  activity tolerance, BADL retraining, functional transfers, standing balance, cognitive remediation, family education, LUE NMR   Mobility   steady assist/close supervision overall, poor endurance  supervision overall  L NMR, cognitive remediation, balance, endurance, gait, transfers, pt education, safety awareness   Communication             Safety/Cognition/ Behavioral Observations  Min-Mod A  Min A   attention, problem solving, recall   Pain   denies         Skin   LUE weeping with guaze inplace. incision to scalp OTA- staples out. Bruising to BUE, Right ear and face  free of skin breakdown and infection min assist  assess skin q shift and wrap LUE as needed    Rehab Goals Patient on target to meet rehab goals: Yes *See Care Plan and progress notes for long and short-term goals.  Barriers to Discharge: urine retention, decreased balance and safety awareness    Possible Resolutions to Barriers:  continued cognitive remediation, optimize bladder emptying, ?i/o caths    Discharge Planning/Teaching Needs:  Home with spouse who can provide supervision/  Teaching is ongoing with wife   Team Discussion:  Still not voiding.  Staple out;  Fatigues easily still and occ confused.  Supervision with amb but needs some assist with LB self care.  Hope to be at D3 diet by d/c.    Revisions to Treatment Plan:  None   Continued Need for Acute Rehabilitation Level of Care: The patient requires daily medical management by a physician with specialized training in physical medicine and rehabilitation for the following conditions: Daily direction of a multidisciplinary physical rehabilitation program to ensure safe treatment while eliciting the highest outcome that is of practical value to the patient.: Yes Daily medical management of patient stability for increased activity during participation in an intensive rehabilitation regime.: Yes Daily analysis of laboratory values and/or radiology reports with any subsequent need for medication adjustment of medical intervention for : Wound care problems;Neurological problems;Urological problems  Fenton Candee 01/02/2017, 11:40 AM

## 2016-12-31 NOTE — Progress Notes (Signed)
Speech Language Pathology Daily Session Note  Patient Details  Name: Alex DuhamelJohn R Spooner MRN: [id number] Date of Birth: Mar 25, 1931  Today's Date: 12/31/2016 SLP Individual Time: [id number] SLP Individual Time Calculation (min): 45 min  Short Term Goals: Week 2: SLP Short Term Goal 1 (Week 2): Pt will demonstrate functional problem solving of basic, famaliar tasks with Min A verbal cues.  SLP Short Term Goal 2 (Week 2): Pt will answer orientation questions with Mod A multimodal cues.  SLP Short Term Goal 3 (Week 2): Pt will sustain attention for ~15 minutes with Mod A verbal cues for redirection to tasks. SLP Short Term Goal 4 (Week 2): Pt will consume trials of dysphagia 3 textures with minimal overt s/s of aspiration and Min A verbal cues for complete oral clearing over 2 session prior to upgrade.  SLP Short Term Goal 5 (Week 2): Patient will consume current diet with minimal overt s/s of aspiration with Supervision verbal cues.   Skilled Therapeutic Interventions: Skilled treatment session focused on dysphagia and cognitive goals. SLP facilitated session by providing trials of Dys. 3 textures. Patient consumed trials and demonstrated efficient mastication and complete oral clearance. Recommend trial tray prior to upgrade. Patient demonstrated alternating attention between self-feeding and functional conversation for 30 minutes with Mod I. Patient left upright in bed with family present. Continue with current plan of care.      Function:  Eating Eating   Modified Consistency Diet: Yes Eating Assist Level: Set up assist for;Swallowing techniques: self managed   Eating Set Up Assist For: Opening containers       Cognition Comprehension Comprehension assist level: Follows basic conversation/direction with no assist  Expression   Expression assist level: Expresses basic needs/ideas: With no assist  Social Interaction Social Interaction assist level: Interacts appropriately 90% of the time -  Needs monitoring or encouragement for participation or interaction.  Problem Solving Problem solving assist level: Solves basic 75 - 89% of the time/requires cueing 10 - 24% of the time  Memory Memory assist level: Recognizes or recalls 75 - 89% of the time/requires cueing 10 - 24% of the time    Pain Pain Assessment Pain Assessment: No/denies pain  Therapy/Group: Individual Therapy  Jamaree Hosier 12/31/2016, 4:08 PM

## 2016-12-31 NOTE — Progress Notes (Signed)
Occupational Therapy Note  Patient Details  Name: Alex [name] MRN: [id number] Date of Birth: Mar 17, 1931  Today's Date: 12/31/2016 OT Individual Time: 1410-1440 OT Individual Time Calculation (min): 30 min   Pt denied pain Individual therapy  Pt engaged in table tasks with focus on LUE FMC/dexterity activities.  Pt issued theraputty with small beads embedded.  Pt required to removed beads using LUE only.  Pt able to complete task with extra time.  Pt returned theraputty to container and replaced lid using BUE. Pt remained in w/c with QRB in place and chair alarm activated.   Alex NeriLanier, Alex [name] 12/31/2016, 2:53 PM

## 2016-12-31 NOTE — Progress Notes (Signed)
Physical Therapy Session Note  Patient Details  Name: Alex DuhamelJohn R [name] MRN: [id number] Date of Birth: [birth year]  Today's Date: 12/31/2016 PT Individual Time: 1000-1100 PT Individual Time Calculation (min): 60 min   Short Term Goals: Week 2:  PT Short Term Goal 1 (Week 2): Pt will negotiate 12 steps with min assist for BLE strengthening. PT Short Term Goal 3 (Week 2): Pt will demonstrate intellectual awareness with supervision.   Skilled Therapeutic Interventions/Progress Updates:  Treatment 1: Pt received in handoff from OT; pt without c/o pain during session. Pt ambulated room>gym with RW & supervision with continued cuing for forward gaze & upright posture with fair demo by pt. Pt continues to demonstrate impaired safety awareness as he will not reach back for w/c armrests before transferring stand>sit. In gym, pt engaged in peg board activity with LUE with task focusing on LUE fine motor control & NMR, as well as problem solving. Pt required moderate fade to min cuing for problem solving and significantly extra time to select & place pegs with LUE. Pt ambulated 60 ft with RW & supervision to stairs and negotiated 4 steps with R rail to simulate home entry/exit. Pt able to ascend stairs with min assist but required max assist to prevent fall as pt with L knee buckling when descending first step. Pt then able to descend remaining stairs with cuing for compensatory pattern and min/mod assist. At end of session pt left sitting in w/c in room with QRB & chair alarm on, wife present to supervise, and all needs within reach.   Treatment 2: Pt received in bed requesting to rest as he is very fatigued following therapies today.  Pt unwilling to participate in bed level exercises, nor transfer to sitting EOB. Pt missed 45 minutes skilled PT treatment 2/2 fatigue.     Therapy Documentation Precautions:  Precautions Precautions: Fall Precaution Comments: ataxic, poor safety awareness Restrictions Weight  Bearing Restrictions: No  General: PT Amount of Missed Time (min): 45 Minutes PT Missed Treatment Reason: Patient unwilling to participate;Patient fatigue  Pain: Pain Assessment Pain Assessment: No/denies pain   See Function Navigator for Current Functional Status.   Therapy/Group: Individual Therapy  Sandi MariscalVictoria M Hurshel Bouillon 12/31/2016, 4:48 PM

## 2017-01-01 ENCOUNTER — Inpatient Hospital Stay (HOSPITAL_COMMUNITY): Payer: Medicare Other | Admitting: Occupational Therapy

## 2017-01-01 ENCOUNTER — Inpatient Hospital Stay (HOSPITAL_COMMUNITY): Payer: Medicare Other | Admitting: Physical Therapy

## 2017-01-01 ENCOUNTER — Inpatient Hospital Stay (HOSPITAL_COMMUNITY): Payer: Medicare Other | Admitting: Speech Pathology

## 2017-01-01 DIAGNOSIS — S069X2S Unspecified intracranial injury with loss of consciousness of 31 minutes to 59 minutes, sequela: Secondary | ICD-10-CM

## 2017-01-01 LAB — CBC
HEMATOCRIT: 35.7 % — AB (ref 39.0–52.0)
Hemoglobin: 11.8 g/dL — ABNORMAL LOW (ref 13.0–17.0)
MCH: 32.9 pg (ref 26.0–34.0)
MCHC: 33.1 g/dL (ref 30.0–36.0)
MCV: 99.4 fL (ref 78.0–100.0)
Platelets: 119 10*3/uL — ABNORMAL LOW (ref 150–400)
RBC: 3.59 MIL/uL — AB (ref 4.22–5.81)
RDW: 13.9 % (ref 11.5–15.5)
WBC: 14.8 10*3/uL — ABNORMAL HIGH (ref 4.0–10.5)

## 2017-01-01 MED ORDER — LEVETIRACETAM 500 MG PO TABS
500.0000 mg | ORAL_TABLET | Freq: Two times a day (BID) | ORAL | Status: DC
  Administered 2017-01-01 – 2017-01-10 (×19): 500 mg via ORAL
  Filled 2017-01-01 (×19): qty 1

## 2017-01-01 MED ORDER — DEXAMETHASONE 2 MG PO TABS
2.0000 mg | ORAL_TABLET | Freq: Two times a day (BID) | ORAL | Status: DC
  Administered 2017-01-01 – 2017-01-08 (×14): 2 mg via ORAL
  Filled 2017-01-01 (×14): qty 1

## 2017-01-01 MED ORDER — BETHANECHOL CHLORIDE 25 MG PO TABS
25.0000 mg | ORAL_TABLET | Freq: Three times a day (TID) | ORAL | Status: DC
  Administered 2017-01-01 – 2017-01-02 (×3): 25 mg via ORAL
  Filled 2017-01-01 (×3): qty 1

## 2017-01-01 NOTE — Progress Notes (Signed)
Speech Language Pathology Daily Session Note  Patient Details  Name: Alex DuhamelJohn R [name] MRN: [id number] Date of Birth: Nov 02, 1930  Today's Date: 01/01/2017 SLP Individual Time: 1400-1430 SLP Individual Time Calculation (min): 30 min  Short Term Goals: Week 2: SLP Short Term Goal 1 (Week 2): Pt will demonstrate functional problem solving of basic, famaliar tasks with Min A verbal cues.  SLP Short Term Goal 2 (Week 2): Pt will answer orientation questions with Mod A multimodal cues.  SLP Short Term Goal 3 (Week 2): Pt will sustain attention for ~15 minutes with Mod A verbal cues for redirection to tasks. SLP Short Term Goal 4 (Week 2): Pt will consume trials of dysphagia 3 textures with minimal overt s/s of aspiration and Min A verbal cues for complete oral clearing over 2 session prior to upgrade.  SLP Short Term Goal 5 (Week 2): Patient will consume current diet with minimal overt s/s of aspiration with Supervision verbal cues.   Skilled Therapeutic Interventions: Skilled treatment session focused on cognition goals. SLP facilitated session by providing supervision cues for alternating attention between playing checkers and conversation about golf program on TV.  Pt able to complete semi-complex problem solving task with Min A faded to supervision cues. PT was left upright in bed, bed alarm on and all needs within reach. Continue per current plan of care.      Function:   Cognition Comprehension Comprehension assist level: Follows basic conversation/direction with no assist  Expression   Expression assist level: Expresses basic needs/ideas: With no assist  Social Interaction Social Interaction assist level: Interacts appropriately 90% of the time - Needs monitoring or encouragement for participation or interaction.  Problem Solving Problem solving assist level: Solves basic 75 - 89% of the time/requires cueing 10 - 24% of the time  Memory Memory assist level: Recognizes or recalls 75 - 89% of  the time/requires cueing 10 - 24% of the time    Pain    Therapy/Group: Individual Therapy   Vann Okerlund B. Dreama Saaverton, M.S., CCC-SLP Speech-Language Pathologist   Dahmir Epperly 01/01/2017, 3:19 PM

## 2017-01-01 NOTE — Progress Notes (Signed)
[facility]ICAL [facility]IC[facility] & REHABILITATION     PROGRESS NOTE  Subjective/Complaints:  No new issues. Slept well  ROS: pt denies nausea, vomiting, diarrhea, cough, shortness of breath or chest pain    Objective: Vital Signs: Blood pressure (!) 99/52, pulse 68, temperature 97.1 F (36.2 C), temperature source Oral, resp. rate 16, weight 101 kg (222 lb 10.6 oz), SpO2 98 %. No results found.  Recent Labs  12/29/16 0925 01/01/17 0638  WBC 18.4* 14.8*  HGB 11.9* 11.8*  HCT 36.2* 35.7*  PLT 145* PENDING    Recent Labs  12/31/16 0614  NA 134*  K 4.4  CL 98*  GLUCOSE 165*  BUN 27*  CREATININE 0.82  CALCIUM 8.0*   CBG (last 3)  No results for input(s): GLUCAP in the last 72 hours.  Wt Readings from Last 3 Encounters:  01/01/17 101 kg (222 lb 10.6 oz)  12/16/16 103.2 kg (227 lb 8.2 oz)  12/15/16 105.2 kg (232 lb)    Physical Exam:  BP (!) 99/52 (BP Location: Right Arm)   Pulse 68   Temp 97.1 F (36.2 C) (Oral)   Resp 16   Wt 101 kg (222 lb 10.6 oz)   SpO2 98%   BMI 28.59 kg/m  Constitutional: NAD. Well-developed. Vital signs reviewed.  Mouth-0ral mucosa pink/moist HENT: scalp incision clean and intact Eyes: EOMI. No discharge.  Cardiovascular:IRR IRR Respiratory: CTA B GI: Soft. Bowel sounds are normal.  Musc: No tenderness. No edema Neurological.Alert and oriented x2/3 (knew month/year). Recalled [place] at obx Motor: B/l UE 4/5 prox to distal.  B/l LE: 4+/5 proximal to distal--stable. Skin: Warm and dry.   Psych: pleasant and cooperative.    Assessment/Plan: 1. Functional deficits secondary to SDH sp craniotomy which require 3+ hours per day of interdisciplinary therapy in a comprehensive inpatient rehab setting. Physiatrist is providing close team supervision and 24 hour management of active medical problems listed below. Physiatrist and rehab team continue to assess barriers to discharge/monitor patient progress toward functional and  medical goals.  Function:  Bathing Bathing position Bathing activity did not occur: Refused Position: Wheelchair/chair at sink  Bathing parts Body parts bathed by patient: Right arm, Left arm, Chest, Abdomen, Front perineal area, Buttocks, Right upper leg, Left upper leg, Right lower leg, Left lower leg Body parts bathed by helper: Buttocks  Bathing assist Assist Level: Touching or steadying assistance(Pt > 75%)      Upper Body Dressing/Undressing Upper body dressing Upper body dressing/undressing activity did not occur: Refused What is the patient wearing?: Pull over shirt/dress     Pull over shirt/dress - Perfomed by patient: Thread/unthread right sleeve, Pull shirt over trunk, Put head through opening, Thread/unthread left sleeve Pull over shirt/dress - Perfomed by helper: Thread/unthread left sleeve        Upper body assist Assist Level: Supervision or verbal cues      Lower Body Dressing/Undressing Lower body dressing   What is the patient wearing?: Underwear, Pants, Non-skid slipper socks, Ted Hose Underwear - Performed by patient: Thread/unthread right underwear leg, Thread/unthread left underwear leg, Pull underwear up/down Underwear - Performed by helper: Thread/unthread left underwear leg Pants- Performed by patient: Pull pants up/down Pants- Performed by helper: Thread/unthread right pants leg, Thread/unthread left pants leg Non-skid slipper socks- Performed by patient: Don/doff right sock, Don/doff left sock Non-skid slipper socks- Performed by helper: Don/doff right sock, Don/doff left sock   Socks - Performed by helper: Don/doff right sock  TED Hose - Performed by helper: Don/doff right TED hose, Don/doff left TED hose  Lower body assist Assist for lower body dressing: Touching or steadying assistance (Pt > 75%)      Toileting Toileting Toileting activity did not occur: No continent bowel/bladder event Toileting steps completed by patient:  Performs perineal hygiene Toileting steps completed by helper: Adjust clothing prior to toileting, Adjust clothing after toileting Toileting Assistive Devices: Grab bar or rail  Toileting assist Assist level: Touching or steadying assistance (Pt.75%)   Transfers Chair/bed transfer   Chair/bed transfer method: Stand pivot Chair/bed transfer assist level: Touching or steadying assistance (Pt > 75%) Chair/bed transfer assistive device: Armrests     Locomotion Ambulation     Max distance: 100 ft Assist level: Supervision or verbal cues   Wheelchair   Type: Manual Max wheelchair distance: 70 Assist Level: Supervision or verbal cues  Cognition Comprehension Comprehension assist level: Follows basic conversation/direction with no assist  Expression Expression assist level: Expresses basic needs/ideas: With no assist  Social Interaction Social Interaction assist level: Interacts appropriately 90% of the time - Needs monitoring or encouragement for participation or interaction.  Problem Solving Problem solving assist level: Solves basic 75 - 89% of the time/requires cueing 10 - 24% of the time  Memory Memory assist level: Recognizes or recalls 75 - 89% of the time/requires cueing 10 - 24% of the time    Medical Problem List and Plan: 1. 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 DVT Prophylaxis/Anticoagulation: SCDs. Monitor for any signs of DVT 3. Pain Management: Hydrocodone as needed 4. Mood: Xanax 0.25 mg daily at bedtime,Lexapro 10 mg daily 5. Neuropsych: This patient is notcapable of making decisions on hisown behalf. 6. Skin/Wound Care: Removed staples 7. Fluids/Electrolytes/Nutrition: Routine I&Os  -encourage PO 8.Seizure prophylaxis. Keppra 500 mg every 12 hours 9.Atrial  fibrillation/AVR/CABG/PPM. Continue low-dose aspirin. Amiodarone 200 mg twice a day, Toprol 12.5 mg twice a day. Follow-up per cardiology services 10.Dysphagia. Dysphagia 2 with thin. Follow-up speech therapy  -  cepacol spray for sore throat 11.Acute diastolic congestive heart failure. Lasix  daily. Monitor for any fluid overload  -weights are down---lasix decreased back to 20mg  daily 12.Hypokalemia.   K+ 4.4   Continue supplement  -I personally reviewed the patient's labs today.   13.History of COPD. Check oxygen saturations every shift 14.History of alcohol abuse. Monitor for withdrawal. Provide counseling as appropriate 15.Constipation. Laxative assistance 16. Transaminitis:   Improving  Atorvastatin decreased to 40 on 2/26    17. Leukocytosis  Afebrile, likely steroid induced---weaning steroids to off  WBCs down to 14k   -   Will cont to monitor 18. Macrocytic anemia  Hb 11.0 3/5     Cont Vit supplementation 19. HTN  Low dose Lisinopril started 2/28--dc d/t low bp's   Cont to monitor 20. Urinary retention  Bethanechol   50 mg tid--- --wean as soon as possible  UA?+, Ucx neg  -make sure patient is on commode or eob to empty  -voiding intermittently, cath as needed 21. LE edema--improved  Lasix reduced back to 20mg  daily for now  LOS (Days) 13 A FACE TO FACE EVALUATION WAS PERFORMED  Edmond Ginsberg T 01/01/2017 9:22 AM

## 2017-01-01 NOTE — Progress Notes (Signed)
Occupational Therapy Session Note  Patient Details  Name: Alex DuhamelJohn R [name] MRN: [id number] Date of Birth: August 08, 1931  Today's Date: 01/01/2017 OT Individual Time: 0945-1100 OT Individual Time Calculation (min): 75 min    Short Term Goals: Week 2:  OT Short Term Goal 1 (Week 2): Pt will perform toileting tasks with steady A OT Short Term Goal 2 (Week 2): Pt will perform bathing tasks with min A at shower level OT Short Term Goal 3 (Week 2): Pt will perform LB dressing tasks with mod A OT Short Term Goal 4 (Week 2): Pt will perform grooming tasks standing at sink with steady a  Skilled Therapeutic Interventions/Progress Updates:    Pt seen this session to facilitate left side coordination and activity tolerance.  Pt received in room with wife.  Asked pt to recall what he worked on with PT in earlier session. Pt could not recall, but with some cues he was able to state 3-4 activities he did.  Pt was already dressed and declined shower.  His brief was not fitting well, so pt stood with close S as therapist changed brief and pt cleansed his bottom more thoroughly.  Pt taken to therapy gym and used RW to transfer to mat with close S.  Sitting unsupported pt worked on L hand coordination with peg board activity. He initially had great difficulty but it improved well with visual attention on hand. Pt feeling tired, so treatment continued in supine on mat with BUE coordination with dowel bar overhead raises and Ball holds for tricep extensions.  Hip bridges 12x. Sat to EOM and agreeable to some ambulation. Pt stood with close S and ambulated about 50 feet before feeling too tired. Used w/c to return to room.  Chair alarm and quick release belt on. Pt in room with spouse.    Therapy Documentation Precautions:  Precautions Precautions: Fall Precaution Comments: ataxic, poor safety awareness Restrictions Weight Bearing Restrictions: No    Vital Signs: Therapy Vitals Pulse Rate: 100 Resp: 20 BP:  109/62 Patient Position (if appropriate): Sitting Oxygen Therapy SpO2: 98 % O2 Device: Not Delivered Pain:  no c/o pain ADL:   See Function Navigator for Current Functional Status.   Therapy/Group: Individual Therapy  Jonan Seufert 01/01/2017, 12:19 PM

## 2017-01-01 NOTE — Progress Notes (Signed)
Speech Language Pathology Daily Session Note  Patient Details  Name: Alex DuhamelJohn R [name] MRN: [id number] Date of Birth: June 12, 1931  Today's Date: 01/01/2017 SLP Individual Time: 1130-1200 SLP Individual Time Calculation (min): 30 min  Short Term Goals: Week 2: SLP Short Term Goal 1 (Week 2): Pt will demonstrate functional problem solving of basic, famaliar tasks with Min A verbal cues.  SLP Short Term Goal 2 (Week 2): Pt will answer orientation questions with Mod A multimodal cues.  SLP Short Term Goal 3 (Week 2): Pt will sustain attention for ~15 minutes with Mod A verbal cues for redirection to tasks. SLP Short Term Goal 4 (Week 2): Pt will consume trials of dysphagia 3 textures with minimal overt s/s of aspiration and Min A verbal cues for complete oral clearing over 2 session prior to upgrade.  SLP Short Term Goal 5 (Week 2): Patient will consume current diet with minimal overt s/s of aspiration with Supervision verbal cues.   Skilled Therapeutic Interventions: Skilled treatment session focused on dysphagia goals. SLP facilitated session by providing tray set-up with trial lunch meal of Dys. 3 textures with thin liquids. Patient consumed meal without overt s/s of aspiration and demonstrated efficient mastication with complete oral clearance with assistance of liquid washes. Recommend patient upgrade to Dys. 3 textures. Patient left upright in wheelchair with family present. Continue with current plan of care.      Function:  Eating Eating   Modified Consistency Diet: Yes Eating Assist Level: Set up assist for;Swallowing techniques: self managed   Eating Set Up Assist For: Opening containers       Cognition Comprehension Comprehension assist level: Follows basic conversation/direction with no assist  Expression   Expression assist level: Expresses basic needs/ideas: With no assist  Social Interaction Social Interaction assist level: Interacts appropriately 90% of the time - Needs  monitoring or encouragement for participation or interaction.  Problem Solving Problem solving assist level: Solves basic 75 - 89% of the time/requires cueing 10 - 24% of the time  Memory Memory assist level: Recognizes or recalls 75 - 89% of the time/requires cueing 10 - 24% of the time    Pain No/Denies Pain   Therapy/Group: Individual Therapy  Verlinda Slotnick 01/01/2017, 3:23 PM

## 2017-01-01 NOTE — Progress Notes (Signed)
Physical Therapy Session Note  Patient Details  Name: Alex [name] MRN: [id number] Date of Birth: 1930/12/04  Today's Date: 01/01/2017 PT Individual Time: 0800-0900 AND 1600-1700 PT Individual Time Calculation (min): 60 min AND 60 min   Short Term Goals: Week 1:  PT Short Term Goal 1 (Week 1): pt will transfer consistently with min assist and LRAD PT Short Term Goal 1 - Progress (Week 1): Met PT Short Term Goal 2 (Week 1): Pt will ambulate 40' with LRAD and min assist PT Short Term Goal 2 - Progress (Week 1): Met PT Short Term Goal 3 (Week 1): pt will demonstrate intellectual awareness with min cues in 25% of opportunities PT Short Term Goal 3 - Progress (Week 1): Progressing toward goal Week 2:  PT Short Term Goal 1 (Week 2): Pt will negotiate 12 steps with min assist for BLE strengthening. PT Short Term Goal 3 (Week 2): Pt will demonstrate intellectual awareness with supervision.  Week 3:     Skilled Therapeutic Interventions/Progress Updates:   Session 1:   Pt received supine in bed and agreeable to PT. Supine>sit transfer with supervision assist, heavy use of the bed features and min cues for technique.   Gait in room to BR with supervision assist for bowel movement. Toilet transfer with supervision assist use of rails in bathroom. Pt able to complete perineal hygiene with Supervision assist from PT for Alex [name].   Denture care with min assist from PT for fine motor control to control box and problem solve ways to prevent dropping box on floor.   Gait with RW x 177f with supervision assist from PT with min cues throughout for improved posture and step length. Gait without Ad x 546fwith min assist from PT to prevent lateral LOB. Dynamic gait training to walk over 2 canes on floor with Close supervision assist and cues for improved AD managment and gait pattern over obstacles.   Standing dynamic balance to perform mini lunge to one of 3 targets x 9 BLE . Min-mod assist from PT to  prevent lateral L LOB x 2.   [facility] returned to [facility] and [facility] reach and all needs met.    Session 2:   Pt received supine in bed and agreeable to PT. Supine>sit transfer with supervision  assist and min cues for safety.  Pt found to have had incontinent bladder evacuation. Gait to bathroom with RW. Patient perform toilet transfer with supervision as well as perform perineal hygiene. Standing balance with min-supervision assist to perform self cleaning for safety. Min assist for clothing management from PT. PT assisted patient with [name] assist as well as standing balance to perform hand hyigene. Min cues for sequencing of tasks around hand hygiene. PT exchanged soiled linens to prevent skin break down with return to bed following dinner.  Pt left sitting in WC with QRB in place and all needs met.            Therapy Documentation Precautions:  Precautions Precautions: Fall Precaution Comments: ataxic, poor safety awareness Restrictions Weight Bearing Restrictions: No Vital Signs: Therapy Vitals Temp: 97.1 F (36.2 C) Temp Source: Oral Pulse Rate: 68 Resp: 16 BP: (!) 99/52 Patient Position (if appropriate): Lying Oxygen Therapy SpO2: 98 % O2 Device: Not Delivered Pain: 0/10   See Function Navigator for Current Functional Status.   Therapy/Group: Individual Therapy  AuLorie [name]/05/2017, 8:46 AM

## 2017-01-02 ENCOUNTER — Inpatient Hospital Stay (HOSPITAL_COMMUNITY): Payer: Medicare Other

## 2017-01-02 ENCOUNTER — Inpatient Hospital Stay (HOSPITAL_COMMUNITY): Payer: Medicare Other | Admitting: Speech Pathology

## 2017-01-02 ENCOUNTER — Inpatient Hospital Stay (HOSPITAL_COMMUNITY): Payer: Medicare Other | Admitting: Physical Therapy

## 2017-01-02 LAB — GLUCOSE, CAPILLARY
Glucose-Capillary: 216 mg/dL — ABNORMAL HIGH (ref 65–99)
Glucose-Capillary: 185 mg/dL — ABNORMAL HIGH (ref 65–99)

## 2017-01-02 MED ORDER — BETHANECHOL CHLORIDE 10 MG PO TABS
10.0000 mg | ORAL_TABLET | Freq: Three times a day (TID) | ORAL | Status: DC
  Administered 2017-01-02 – 2017-01-04 (×6): 10 mg via ORAL
  Filled 2017-01-02 (×6): qty 1

## 2017-01-02 NOTE — Progress Notes (Signed)
Social Work Patient ID: Alex DuhamelJohn R [name], male   DOB: July 08, 1931, 83 y.o.   MRN: [id number]  Finally able to meet with pt and wife this afternoon to review team conference.  Both aware that team continues to plan for 3/17 d/c date and supervision goals.  Pt denies any concerns.  Spoke privately with wife who does express concerns about managing pt's care at home but she and family have made contact with a private duty agency to assist her.  Her primary concern is keeping pt away from driving and alcohol.  Notes that she is concerned he will become angry with her if she tries to limit either.  Told her that MD and team will inform him of these restrictions.  She plans to move his truck out of sight and will take all alcohol out of the home.  Offered support to her.  Continue to follow.  Alex Graefe, LCSW

## 2017-01-02 NOTE — Progress Notes (Signed)
Physical Therapy Session Note  Patient Details  Name: Alex [name] MRN: [id number] Date of Birth: 1931-09-17  Today's Date: 01/02/2017 PT Individual Time: 1520-1550 PT Individual Time Calculation (min): 30 min   Short Term Goals: Week 1:  PT Short Term Goal 1 (Week 1): pt will transfer consistently with min assist and LRAD PT Short Term Goal 1 - Progress (Week 1): Met PT Short Term Goal 2 (Week 1): Pt will ambulate 75' with LRAD and min assist PT Short Term Goal 2 - Progress (Week 1): Met PT Short Term Goal 3 (Week 1): pt will demonstrate intellectual awareness with min cues in 25% of opportunities PT Short Term Goal 3 - Progress (Week 1): Progressing toward goal Week 2:  PT Short Term Goal 1 (Week 2): Pt will negotiate 12 steps with min assist for BLE strengthening. PT Short Term Goal 3 (Week 2): Pt will demonstrate intellectual awareness with supervision.   Skilled Therapeutic Interventions/Progress Updates:   Pt instructed pt in gait training with supervision assist for 3 bouts or 151f, 1028f and 1506fith RW and Supervision assist from PT for safety and cues for posture. Standing therex instructed by PT with BUE support including x15 calf raises, minisquats x10, Hip abduction x 10 BLE and reciporcal marches x 10 .   Patient returned too room and left sitting in [facility] reach and all needs met.        Therapy Documentation Precautions:  Precautions Precautions: Fall Precaution Comments: ataxic, poor safety awareness Restrictions Weight Bearing Restrictions: No General:   Vital Signs: Therapy Vitals Pulse Rate: 60 BP: (!) 106/53 Patient Position (if appropriate): Lying Pain: Pain Assessment Pain Assessment: No/denies pain   See Function Navigator for Current Functional Status.   Therapy/Group: Individual Therapy  AusLorie Phenix9/2018, 10:36 PM

## 2017-01-02 NOTE — Progress Notes (Signed)
Occupational Therapy Note  Patient Details  Name: Jetty DuhamelJohn R Artist MRN: [id number] Date of Birth: [birth year]  Today's Date: 01/02/2017 OT Individual Time: 0900-1030 OT Individual Time Calculation (min): 90 min   Pt denied pain Individual Therapy  Pt engaged in dynamic standing tasks with focus on activity tolerance and increased use of LUE.  Tasks included removing horseshoes from basketball rim and tossing at target.  Pt also required to return horseshoes back to rim while standing.  Pt transtioned to unsupported seated tasks using [facility] tasks (clothespin, large pegs, opening/closing small containers).  Pt fatigues quickly and requires multiple rest breaks.  Pt continues to exhibit impaired coordination/controlled movement of his LUE for functional tasks but initiates use of his LUE at diminished level to perform functional tasks. Pt incontinent of bladder and returned to room.  Pt stood at sink to perform hygiene tasks.  Pt donned clean pants with mod A.  Pt remained seated in w/c with all needs within reach.    Lavone NeriLanier, Zonie Crutcher [facility]Chappell 01/02/2017, 3:12 PM

## 2017-01-02 NOTE — Progress Notes (Signed)
Occupational Therapy Note  Patient Details  Name: Jetty DuhamelJohn R Dechaine MRN: [id number] Date of Birth: 1931/10/12  Today's Date: 01/02/2017 OT Individual Time: 1300-1330 OT Individual Time Calculation (min): 30 min   1:1 no c/o pain  Time for pt to be cathed. Focus on clothing management and bed mobility to assist with preping self for nursing to cath pt. Pt able to bridge and sustained bridge 4-5 times in session and able to wash periarea and buttocks with min A. Pt able to perform bed mobility to come to EOB with bed rail  with close supervision with extra time.  Pt ambulated from bed to gym with one seated rest break for activity tolerance/ endurance. Pt passed off to next therapist.     Adan SisSmith, Malcolm Quast Lynsey 01/02/2017, 1:43 PM

## 2017-01-02 NOTE — Progress Notes (Signed)
[place] PHYSICAL MEDICINE & REHABILITATION     PROGRESS NOTE  Subjective/Complaints:  Just finishing breakfast. Still complains of sore throat. Didn't get spray.   ROS: pt denies nausea, vomiting, diarrhea, cough, shortness of breath or chest pain     Objective: Vital Signs: Blood pressure (!) 108/51, pulse 62, temperature 97.8 F (36.6 C), temperature source Oral, resp. rate 18, weight 101 kg (222 lb 10.6 oz), SpO2 100 %. No results found.  Recent Labs  01/01/17 0638  WBC 14.8*  HGB 11.8*  HCT 35.7*  PLT 119*    Recent Labs  12/31/16 0614  NA 134*  K 4.4  CL 98*  GLUCOSE 165*  BUN 27*  CREATININE 0.82  CALCIUM 8.0*   CBG (last 3)  No results for input(s): GLUCAP in the last 72 hours.  Wt Readings from Last 3 Encounters:  01/02/17 101 kg (222 lb 10.6 oz)  12/16/16 103.2 kg (227 lb 8.2 oz)  12/15/16 105.2 kg (232 lb)    Physical Exam:  BP (!) 108/51 (BP Location: Right Arm)   Pulse 62   Temp 97.8 F (36.6 C) (Oral)   Resp 18   Wt 101 kg (222 lb 10.6 oz)   SpO2 100%   BMI 28.59 kg/m  Constitutional: NAD. Well-developed. Vital signs reviewed.  Mouth-0ral mucosa pink/moist HENT: scalp incision clean and intact, healing nicely Eyes: EOMI. No discharge.  Cardiovascular:IRR IRR Respiratory: CTA B GI: Soft. Bowel sounds are normal.  Musc: No tenderness. no edema Neurological.Alert and oriented x3/3 (month/year).   Motor: B/l UE 4/5 prox to distal.  B/l LE: 4+/5 proximal to distal--stable. Skin: Warm and dry.   Psych: pleasant and cooperative.    Assessment/Plan: 1. Functional deficits secondary to SDH sp craniotomy which require 3+ hours per day of interdisciplinary therapy in a comprehensive inpatient rehab setting. Physiatrist is providing close team supervision and 24 hour management of active medical problems listed below. Physiatrist and rehab team continue to assess barriers to discharge/monitor patient progress toward functional and medical  goals.  Function:  Bathing Bathing position Bathing activity did not occur: Refused Position: Wheelchair/chair at sink  Bathing parts Body parts bathed by patient: Right arm, Left arm, Chest, Abdomen, Front perineal area, Buttocks Body parts bathed by helper: Back  Bathing assist Assist Level: Supervision or verbal cues      Upper Body Dressing/Undressing Upper body dressing Upper body dressing/undressing activity did not occur: Refused What is the patient wearing?: Pull over shirt/dress     Pull over shirt/dress - Perfomed by patient: Thread/unthread right sleeve, Pull shirt over trunk, Put head through opening, Thread/unthread left sleeve Pull over shirt/dress - Perfomed by helper: Thread/unthread left sleeve        Upper body assist Assist Level: Supervision or verbal cues      Lower Body Dressing/Undressing Lower body dressing   What is the patient wearing?: Pants, Non-skid slipper socks, Ted Hose Underwear - Performed by patient: Thread/unthread right underwear leg, Thread/unthread left underwear leg, Pull underwear up/down Underwear - Performed by helper: Thread/unthread left underwear leg Pants- Performed by patient: Thread/unthread right pants leg, Thread/unthread left pants leg, Pull pants up/down Pants- Performed by helper: Thread/unthread right pants leg, Thread/unthread left pants leg Non-skid slipper socks- Performed by patient: Don/doff right sock, Don/doff left sock Non-skid slipper socks- Performed by helper: Don/doff right sock, Don/doff left sock   Socks - Performed by helper: Don/doff right sock           TED Hose -  Performed by helper: Don/doff right TED hose, Don/doff left TED hose  Lower body assist Assist for lower body dressing: Touching or steadying assistance (Pt > 75%)      Toileting Toileting Toileting activity did not occur: No continent bowel/bladder event Toileting steps completed by patient: Performs perineal hygiene Toileting steps  completed by helper: Adjust clothing prior to toileting, Adjust clothing after toileting Toileting Assistive Devices: Grab bar or rail  Toileting assist Assist level: Touching or steadying assistance (Pt.75%)   Transfers Chair/bed transfer   Chair/bed transfer method: Stand pivot Chair/bed transfer assist level: Supervision or verbal cues Chair/bed transfer assistive device: Armrests, Patent attorneyWalker     Locomotion Ambulation     Max distance: 14450ft  Assist level: Supervision or verbal cues   Wheelchair   Type: Manual Max wheelchair distance: 70 Assist Level: Supervision or verbal cues  Cognition Comprehension Comprehension assist level: Follows basic conversation/direction with no assist  Expression Expression assist level: Expresses basic needs/ideas: With no assist  Social Interaction Social Interaction assist level: Interacts appropriately 90% of the time - Needs monitoring or encouragement for participation or interaction.  Problem Solving Problem solving assist level: Solves basic 75 - 89% of the time/requires cueing 10 - 24% of the time  Memory Memory assist level: Recognizes or recalls 75 - 89% of the time/requires cueing 10 - 24% of the time    Medical Problem List and Plan: 1. 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 DVT Prophylaxis/Anticoagulation: SCDs. Monitor for any signs of DVT 3. Pain Management: Hydrocodone as needed 4. Mood: Xanax 0.25 mg daily at bedtime,Lexapro 10 mg daily 5. Neuropsych: This patient is notcapable of making decisions on hisown behalf. 6. Skin/Wound Care: Removed staples 7. Fluids/Electrolytes/Nutrition: Routine I&Os  -encourage PO 8.Seizure prophylaxis. Keppra 500 mg every 12 hours 9.Atrial fibrillation/AVR/CABG/PPM. Continue low-dose aspirin. Amiodarone 200 mg twice a day, Toprol 12.5 mg twice  a day. Follow-up per cardiology services 10.Dysphagia. Dysphagia 2 with thin. Follow-up speech therapy  -  cepacol spray for sore throat 11.Acute diastolic congestive heart failure. Lasix  daily. Monitor for any fluid overload  -weights sl increased over last 48 hours---continue QD lasix for now 12.Hypokalemia.   K+ 4.4   Continue supplement  -I personally reviewed the patient's labs today.   13.History of COPD. Check oxygen saturations every shift 14.History of alcohol abuse. Monitor for withdrawal. Provide counseling as appropriate 15.Constipation. Laxative assistance 16. Transaminitis:   Improving  Atorvastatin decreased to 40 on 2/26    17. Leukocytosis  Afebrile, likely steroid induced---weaning steroids to off  WBCs down to 14k on 3/8     Will cont to monitor 18. Macrocytic anemia  Hb 11.0 3/5     Cont Vit supplementation 19. HTN  Low dose Lisinopril started 2/28--stopped d/t low bp's   Cont to monitor 20. Urinary retention--voiding incontinently, PVR's low  Bethanechol decreased to 25mg  TID 3/8---decrease to 10mg  today  UA?+, Ucx neg. Urine malodorous however------>recollect urine for culture  -make sure patient is on commode or eob to empty  - cath as needed 21. LE edema--improved  Lasix reduced back to 20mg  daily for now  LOS (Days) 14 A FACE TO FACE EVALUATION WAS PERFORMED  Keyerra Lamere T 01/02/2017 9:31 AM

## 2017-01-02 NOTE — Progress Notes (Signed)
Occupational Therapy Session Note  Patient Details  Name: Alex [name] MRN: [id number] Date of Birth: Dec 14, 1930  Today's Date: 01/02/2017 OT Individual Time: 5701-7793 OT Individual Time Calculation (min): 58 min    Short Term Goals: Week 1:  OT Short Term Goal 1 (Week 1): Pt will perform stand pivot transfer with mod A +1 to BSC/ toilet OT Short Term Goal 1 - Progress (Week 1): Met OT Short Term Goal 2 (Week 1): Pt will perform sit to stand for clothing management with  mod A  OT Short Term Goal 2 - Progress (Week 1): Met OT Short Term Goal 3 (Week 1): Pt will perform bed mobility in prep for ADL task with mod A with bed rails and extra time OT Short Term Goal 3 - Progress (Week 1): Met OT Short Term Goal 4 (Week 1): Pt will be oriented x4 with mod external cues OT Short Term Goal 4 - Progress (Week 1): Met Week 2:  OT Short Term Goal 1 (Week 2): Pt will perform toileting tasks with steady A OT Short Term Goal 2 (Week 2): Pt will perform bathing tasks with min A at shower level OT Short Term Goal 3 (Week 2): Pt will perform LB dressing tasks with mod A OT Short Term Goal 4 (Week 2): Pt will perform grooming tasks standing at sink with steady a  Skilled Therapeutic Interventions/Progress Updates:    Pt seen asleep in bed upon arrival> With increased time to arouse, pt agreeable to tx and reporting no pain. Focus of session on ADL retraining. In seated and standing pt shaves at sink supervision-CGA for steadying and increased time. Pt demo discoordinated movement in left hand and throughout session while trying to dispense shaving cream onto face. Pt stands to change brief/complete peri care/posterior hygeine with CGA and VC for posture. Pt doff/don pull over shirt with supervision and VC to thread L sleeve first. Pt threads BLE into pants with VC to pull pant leg over heel prior to standing to advance pants over hips. Pt dons B socks by assuming seated figure 4 with set up. Pt ambulates  around room with CGA for balance. Exited session with Pt supine in bed with all needs met and call light in reach.   Therapy Documentation Precautions:  Precautions Precautions: Fall Precaution Comments: ataxic, poor safety awareness Restrictions Weight Bearing Restrictions: No Pain Assessment Pain Score: 0-No pain See Function Navigator for Current Functional Status.   Therapy/Group: Individual Therapy  Tonny Branch 01/02/2017, 10:46 AM

## 2017-01-02 NOTE — Progress Notes (Signed)
Speech Language Pathology Weekly Progress and Session Note  Patient Details  Name: Alex [name] MRN: [id number] Date of Birth: 1931-05-02  Beginning of progress report period: December 26, 2016 End of progress report period: January 02, 2017  Today's Date: 01/02/2017 SLP Individual Time: 1100-1130 SLP Individual Time Calculation (min): 30 min  Short Term Goals: Week 2: SLP Short Term Goal 1 (Week 2): Pt will demonstrate functional problem solving of basic, famaliar tasks with Min A verbal cues.  SLP Short Term Goal 1 - Progress (Week 2): Met SLP Short Term Goal 2 (Week 2): Pt will answer orientation questions with Mod A multimodal cues.  SLP Short Term Goal 2 - Progress (Week 2): Met SLP Short Term Goal 3 (Week 2): Pt will sustain attention for ~15 minutes with Mod A verbal cues for redirection to tasks. SLP Short Term Goal 3 - Progress (Week 2): Met SLP Short Term Goal 4 (Week 2): Pt will consume trials of dysphagia 3 textures with minimal overt s/s of aspiration and Min A verbal cues for complete oral clearing over 2 session prior to upgrade.  SLP Short Term Goal 4 - Progress (Week 2): Met SLP Short Term Goal 5 (Week 2): Patient will consume current diet with minimal overt s/s of aspiration with Supervision verbal cues.  SLP Short Term Goal 5 - Progress (Week 2): Met    New Short Term Goals: Week 3: SLP Short Term Goal 1 (Week 3): Pt will answer orientation questions with Min A multimodal cues.  SLP Short Term Goal 2 (Week 3): Pt will demonstrate functional problem solving of basic, famaliar tasks with supervision verbal cues.  SLP Short Term Goal 3 (Week 3): Pt will demonstrate selective attention for ~15 minutes in a mildly disctracting enviornment with Min A verbal cues for redirection to tasks. SLP Short Term Goal 4 (Week 3): Pt will consume trials of regular textures with minimal overt s/s of aspiration and Min A verbal cues for complete oral clearing over 2 session prior to upgrade.   SLP Short Term Goal 5 (Week 3): Patient will consume current diet with minimal overt s/s of aspiration with Mod I.   Weekly Progress Updates: Patient has made functional gains and has met 5 of 5 STG's this reporting period. Currently, patient is consuming Dys. 3 textures with thin liquids without overt s/s of aspiration and requires overall supervision verbal cues for use of swallowing compensatory strategies. Patient requires overall supervision verbal cues for sustained attention to tasks, Min A verbal cues for functional problem solving and Mod A for recall of new information. Patient and family education is ongoing. Patient would benefit from continued skilled SLP intervention to maximize his cognitive and swallowing function and overall functional independence prior to discharge.      Intensity: Minumum of 1-2 x/day, 30 to 90 minutes Frequency: 3 to 5 out of 7 days Duration/Length of Stay: 01/10/17 Treatment/Interventions: Cognitive remediation/compensation;Cueing hierarchy;Dysphagia/aspiration precaution training;Environmental controls;Functional tasks;Internal/external aids;Patient/family education;Speech/Language facilitation;Therapeutic Activities   Daily Session  Skilled Therapeutic Interventions: Skilled treatment session focused on cognitive goals. SLP facilitated session by providing Mod A verbal cues for problem solving during a mildly complex task of organizing a 4 time per day pill box. Patient left upright in bed with all needs within reach. Continue with current plan of care.       Function:    Cognition Comprehension Comprehension assist level: Follows basic conversation/direction with no assist  Expression   Expression assist level: Expresses basic needs/ideas: With no  assist  Social Interaction Social Interaction assist level: Interacts appropriately 90% of the time - Needs monitoring or encouragement for participation or interaction.  Problem Solving Problem solving  assist level: Solves basic 75 - 89% of the time/requires cueing 10 - 24% of the time  Memory Memory assist level: Recognizes or recalls 75 - 89% of the time/requires cueing 10 - 24% of the time   Pain No/Denies Pain   Therapy/Group: Individual Therapy  Sarrinah Gardin 01/02/2017, 4:17 PM

## 2017-01-02 NOTE — Progress Notes (Signed)
Occupational Therapy Note  Patient Details  Name: Alex DuhamelJohn R [name] MRN: [id number] Date of Birth: 04-22-1931  Today's Date: 01/02/2017 OT Individual Time: 1330-1400 OT Individual Time Calculation (min): 30 min   Pt denied pain Individual Therapy  Pt engaged in unsupported seated table tasks with focus on increased LUE coordination/controlled movement for functional tasks.  Pt amb back to room from gym with RW and remained in w/c with QRB in place and all needs within reach and wife present.    Lavone NeriLanier, Gwyndolyn Guilford [facility] 01/02/2017, 3:17 PM

## 2017-01-03 ENCOUNTER — Inpatient Hospital Stay (HOSPITAL_COMMUNITY): Payer: Medicare Other | Admitting: Occupational Therapy

## 2017-01-03 NOTE — Progress Notes (Signed)
PHYSICAL MEDICINE & REHABILITATION     PROGRESS NOTE  Subjective/Complaints:  No complaints overnight. Wife at bedside. Aware of day and date, as well as discharge date  ROS: pt denies nausea, vomiting, diarrhea, cough, shortness of breath or chest pain     Objective: Vital Signs: Blood pressure 112/68, pulse 62, temperature 97.8 F (36.6 C), temperature source Oral, resp. rate 16, weight 101 kg (222 lb 10.6 oz), SpO2 99 %. No results found.  Recent Labs  01/01/17 0638  WBC 14.8*  HGB 11.8*  HCT 35.7*  PLT 119*   No results for input(s): NA, K, CL, GLUCOSE, BUN, CREATININE, CALCIUM in the last 72 hours.  Invalid input(s): CO CBG (last 3)   Recent Labs  01/02/17 1146 01/02/17 1628  GLUCAP 216* 185*    Wt Readings from Last 3 Encounters:  01/02/17 101 kg (222 lb 10.6 oz)  12/16/16 103.2 kg (227 lb 8.2 oz)  12/15/16 105.2 kg (232 lb)    Physical Exam:  BP 112/68 (BP Location: Right Arm)   Pulse 62   Temp 97.8 F (36.6 C) (Oral)   Resp 16   Wt 101 kg (222 lb 10.6 oz)   SpO2 99%   BMI 28.59 kg/m  Constitutional: NAD. Well-developed. Vital signs reviewed.  Mouth-0ral mucosa pink/moist HENT: scalp incision clean and intact, healing nicely Eyes: EOMI. No discharge.  Cardiovascular:IRR IRR Respiratory: CTA B GI: Soft. Bowel sounds are normal.  Musc: No tenderness. no edema Neurological.Alert and oriented x3/3 (month/year).   Motor: B/l UE 4/5 prox to distal.  B/l LE: 4+/5 proximal to distal--stable. Skin: Warm and dry.   Psych: pleasant and cooperative.    Assessment/Plan: 1. Functional deficits secondary to SDH sp craniotomy which require 3+ hours per day of interdisciplinary therapy in a comprehensive inpatient rehab setting. Physiatrist is providing close team supervision and 24 hour management of active medical problems listed below. Physiatrist and rehab team continue to assess barriers to discharge/monitor patient progress toward  functional and medical goals.  Function:  Bathing Bathing position Bathing activity did not occur: Refused Position: Wheelchair/chair at sink  Bathing parts Body parts bathed by patient: Right arm, Left arm, Chest, Abdomen, Front perineal area, Buttocks Body parts bathed by helper: Back  Bathing assist Assist Level: Supervision or verbal cues      Upper Body Dressing/Undressing Upper body dressing Upper body dressing/undressing activity did not occur: Refused What is the patient wearing?: Pull over shirt/dress     Pull over shirt/dress - Perfomed by patient: Thread/unthread right sleeve, Pull shirt over trunk, Put head through opening, Thread/unthread left sleeve Pull over shirt/dress - Perfomed by helper: Thread/unthread left sleeve        Upper body assist Assist Level: Supervision or verbal cues      Lower Body Dressing/Undressing Lower body dressing   What is the patient wearing?: Pants, Non-skid slipper socks, Ted Hose Underwear - Performed by patient: Thread/unthread right underwear leg, Thread/unthread left underwear leg, Pull underwear up/down Underwear - Performed by helper: Thread/unthread left underwear leg Pants- Performed by patient: Thread/unthread right pants leg, Thread/unthread left pants leg, Pull pants up/down Pants- Performed by helper: Thread/unthread right pants leg, Thread/unthread left pants leg Non-skid slipper socks- Performed by patient: Don/doff right sock, Don/doff left sock Non-skid slipper socks- Performed by helper: Don/doff right sock, Don/doff left sock   Socks - Performed by helper: Don/doff right sock           TED Hose - Performed by helper:  Don/doff right TED hose, Don/doff left TED hose  Lower body assist Assist for lower body dressing: Touching or steadying assistance (Pt > 75%)      Toileting Toileting Toileting activity did not occur: No continent bowel/bladder event Toileting steps completed by patient: Performs perineal  hygiene Toileting steps completed by helper: Adjust clothing prior to toileting, Adjust clothing after toileting Toileting Assistive Devices: Grab bar or rail  Toileting assist Assist level: Touching or steadying assistance (Pt.75%)   Transfers Chair/bed transfer   Chair/bed transfer method: Ambulatory Chair/bed transfer assist level: Supervision or verbal cues Chair/bed transfer assistive device: Patent attorneyWalker     Locomotion Ambulation     Max distance: 16750ft Assist level: Supervision or verbal cues   Wheelchair   Type: Manual Max wheelchair distance: 70 Assist Level: Supervision or verbal cues  Cognition Comprehension Comprehension assist level: Follows basic conversation/direction with no assist  Expression Expression assist level: Expresses basic needs/ideas: With no assist  Social Interaction Social Interaction assist level: Interacts appropriately 90% of the time - Needs monitoring or encouragement for participation or interaction.  Problem Solving Problem solving assist level: Solves basic 75 - 89% of the time/requires cueing 10 - 24% of the time  Memory Memory assist level: Recognizes or recalls 75 - 89% of the time/requires cueing 10 - 24% of the time    Medical Problem List and Plan: 1. 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 DVT Prophylaxis/Anticoagulation: SCDs. Monitor for any signs of DVT 3. Pain Management: Hydrocodone as needed 4. Mood: Xanax 0.25 mg daily at bedtime,Lexapro 10 mg daily 5. Neuropsych: This patient is notcapable of making decisions on hisown behalf. 6. Skin/Wound Care: Removed staples 7. Fluids/Electrolytes/Nutrition: Routine I&Os  -encourage PO 8.Seizure prophylaxis. Keppra 500 mg every 12 hours 9.Atrial fibrillation/AVR/CABG/PPM. Continue low-dose aspirin.  Amiodarone 200 mg twice a day, Toprol 12.5 mg twice a day. Follow-up per cardiology services 10.Dysphagia. Dysphagia 2 with thin. Follow-up speech therapy  -  cepacol spray for sore throat 11.Acute diastolic congestive heart failure. Lasix  daily. Monitor for any fluid overload  -weights sl increased over last 48 hours---continue QD lasix for now 12.Hypokalemia.   K+ 4.4   Continue supplement  -I personally reviewed the patient's labs today.   13.History of COPD. Check oxygen saturations every shift 14.History of alcohol abuse. Monitor for withdrawal. Provide counseling as appropriate 15.Constipation. Laxative assistance 16. Transaminitis:   Improving  Atorvastatin decreased to 40 on 2/26    17. Leukocytosis  Afebrile, likely steroid induced---weaning steroids to off  WBCs down to 14k on 3/8     Will cont to monitor 18. Macrocytic anemia  Hb 11.0 3/5     Cont Vit supplementation 19. HTN  Low dose Lisinopril started 2/28--stopped d/t low bp's   Cont to monitor 20. Urinary retention--voiding incontinently, PVR's low  Bethanechol decreased to 25mg  TID 3/8---decrease to 10mg  , last intermittent catheterization 3/ 9. Will monitor before we discontinue  UA?+, Ucx neg. Urine malodorous however------>recollect urine for culture  -make sure patient is on commode or eob to empty  - cath as needed 21. LE edema--improved  Lasix reduced back to 20mg  daily for now  LOS (Days) 15 A FACE TO FACE EVALUATION WAS PERFORMED  Adriane Guglielmo E 01/03/2017 11:11 AM

## 2017-01-03 NOTE — Progress Notes (Signed)
Incontinent void, bladder scan=50cc's. Urine malodorous. Alex MartinezMurray, [name] A

## 2017-01-04 ENCOUNTER — Inpatient Hospital Stay (HOSPITAL_COMMUNITY): Payer: Medicare Other | Admitting: Physical Therapy

## 2017-01-04 MED ORDER — METOPROLOL SUCCINATE ER 25 MG PO TB24
12.5000 mg | ORAL_TABLET | Freq: Every day | ORAL | Status: DC
  Administered 2017-01-05 – 2017-01-10 (×6): 12.5 mg via ORAL
  Filled 2017-01-04 (×5): qty 1

## 2017-01-04 NOTE — Progress Notes (Signed)
Physical Therapy Session Note  Patient Details  Name: Alex DuhamelJohn R [name] MRN: [id number] Date of Birth: Dec 12, 1930  Today's Date: 01/04/2017 PT Individual Time: [id number] PT Individual Time Calculation (min): 41 min   Short Term Goals: Week 2:  PT Short Term Goal 1 (Week 2): Pt will negotiate 12 steps with min assist for BLE strengthening. PT Short Term Goal 1 - Progress (Week 2): Progressing toward goal PT Short Term Goal 2 (Week 2): Pt will demonstrate intellectual awareness with supervision. PT Short Term Goal 2 - Progress (Week 2): Progressing toward goal PT Short Term Goal 3 (Week 2): Pt will demonstrate intellectual awareness with supervision.   Skilled Therapeutic Interventions/Progress Updates:  Pt received in bed, eating lunch, and pt found to be incontinent of urine with hospital gown and bed sheets saturated. Pt transferred supine>sitting EOB with supervision and use of hospital bed features. Pt transferred sit>stand with supervision and required mod assist to thread briefs on BLE. Pt transferred sit<>stand with supervision and required steady assist for balance while pt pulled briefs over hips; pt with small loss of balance & returned to sitting on EOB. Provided total assist to don new gown for time management purposes. Pt transferred bed>w/c via stand pivot with steady assist. Transported pt to dayroom where pt continued to consume lunch with LUE with task focusing on fine motor control of hand. Pt required supervision<>HOH assist to place food on utensil and bring utensil to mouth; pt with LUE tremors when bringing utensil to mouth. Pt required cuing to consume liquids between bites of food. Discussed pt's deficits following hospital stay, with pt reporting "weakness" as his only deficit; educated pt on L inattention, decreased neuro muscular control of LUE/LLE, impaired balance and endurance. Educated pt on inability to drive and recommendations of not to consume alcohol unless cleared by  MD. Pt reports he had 2-4 glasses wine each afternoon but since being in hospital he has "no desire for any". At end of session pt left in handoff to NT to allow pt to finish eating lunch, now using RUE.   Therapy Documentation Precautions:  Precautions Precautions: Fall Precaution Comments: ataxic, poor safety awareness Restrictions Weight Bearing Restrictions: No  Pain: No c/o pain.   See Function Navigator for Current Functional Status.   Therapy/Group: Individual Therapy  Sandi MariscalVictoria M Siler Mavis 01/04/2017, 3:07 PM

## 2017-01-04 NOTE — Progress Notes (Signed)
2000- BP=100/44, Dr. [name] paged and ordered to hold scheduled Toprol. Ate 2 ice creams and drank 120cc's of water.  At 2300 large incontinent void, bladder scan=157.  Weeping noted to LUE.  Patient without complaint of. Alex MartinezMurray, [name]

## 2017-01-04 NOTE — Progress Notes (Signed)
[place] PHYSICAL MEDICINE & REHABILITATION     PROGRESS NOTE  Subjective/Complaints:  No issues overnight, patient denies dizziness  ROS: pt denies nausea, vomiting, diarrhea, cough, shortness of breath or chest pain     Objective: Vital Signs: Blood pressure (!) 100/53, pulse 60, temperature 98.2 F (36.8 C), temperature source Oral, resp. rate 18, weight 101 kg (222 lb 10.6 oz), SpO2 100 %. No results found. No results for input(s): WBC, HGB, HCT, PLT in the last 72 hours. No results for input(s): NA, K, CL, GLUCOSE, BUN, CREATININE, CALCIUM in the last 72 hours.  Invalid input(s): CO CBG (last 3)   Recent Labs  01/02/17 1146 01/02/17 1628  GLUCAP 216* 185*    Wt Readings from Last 3 Encounters:  01/02/17 101 kg (222 lb 10.6 oz)  12/16/16 103.2 kg (227 lb 8.2 oz)  12/15/16 105.2 kg (232 lb)    Physical Exam:  BP (!) 100/53 (BP Location: Right Arm)   Pulse 60   Temp 98.2 F (36.8 C) (Oral)   Resp 18   Wt 101 kg (222 lb 10.6 oz)   SpO2 100%   BMI 28.59 kg/m  Constitutional: NAD. Well-developed. Vital signs reviewed.  Mouth-0ral mucosa pink/moist HENT: scalp incision clean and intact, healing nicely Eyes: EOMI. No discharge.  Cardiovascular:IRR IRR Respiratory: CTA B GI: Soft. Bowel sounds are normal.  Musc: No tenderness. no edema Neurological.Alert and oriented x3/3 (month/year).   Motor: B/l UE 4/5 prox to distal.  B/l LE: 4+/5 proximal to distal--stable. Skin: Warm and dry.   Psych: pleasant and cooperative.    Assessment/Plan: 1. Functional deficits secondary to SDH sp craniotomy which require 3+ hours per day of interdisciplinary therapy in a comprehensive inpatient rehab setting. Physiatrist is providing close team supervision and 24 hour management of active medical problems listed below. Physiatrist and rehab team continue to assess barriers to discharge/monitor patient progress toward functional and medical  goals.  Function:  Bathing Bathing position Bathing activity did not occur: Refused Position: Wheelchair/chair at sink  Bathing parts Body parts bathed by patient: Right arm, Left arm, Chest, Abdomen, Front perineal area, Buttocks Body parts bathed by helper: Back  Bathing assist Assist Level: Supervision or verbal cues      Upper Body Dressing/Undressing Upper body dressing Upper body dressing/undressing activity did not occur: Refused What is the patient wearing?: Pull over shirt/dress     Pull over shirt/dress - Perfomed by patient: Thread/unthread right sleeve, Pull shirt over trunk, Put head through opening, Thread/unthread left sleeve Pull over shirt/dress - Perfomed by helper: Thread/unthread left sleeve        Upper body assist Assist Level: Supervision or verbal cues      Lower Body Dressing/Undressing Lower body dressing   What is the patient wearing?: Pants, Non-skid slipper socks, Ted Hose Underwear - Performed by patient: Thread/unthread right underwear leg, Thread/unthread left underwear leg, Pull underwear up/down Underwear - Performed by helper: Thread/unthread left underwear leg Pants- Performed by patient: Thread/unthread right pants leg, Thread/unthread left pants leg, Pull pants up/down Pants- Performed by helper: Thread/unthread right pants leg, Thread/unthread left pants leg Non-skid slipper socks- Performed by patient: Don/doff right sock, Don/doff left sock Non-skid slipper socks- Performed by helper: Don/doff right sock, Don/doff left sock   Socks - Performed by helper: Don/doff right sock           TED Hose - Performed by helper: Don/doff right TED hose, Don/doff left TED hose  Lower body assist Assist for lower  body dressing: Touching or steadying assistance (Pt > 75%)      Toileting Toileting Toileting activity did not occur: No continent bowel/bladder event Toileting steps completed by patient: Performs perineal hygiene Toileting steps  completed by helper: Adjust clothing prior to toileting, Adjust clothing after toileting Toileting Assistive Devices: Grab bar or rail  Toileting assist Assist level: Touching or steadying assistance (Pt.75%)   Transfers Chair/bed transfer   Chair/bed transfer method: Ambulatory Chair/bed transfer assist level: Supervision or verbal cues Chair/bed transfer assistive device: Patent attorney     Max distance: 174ft Assist level: Supervision or verbal cues   Wheelchair   Type: Manual Max wheelchair distance: 70 Assist Level: Supervision or verbal cues  Cognition Comprehension Comprehension assist level: Follows basic conversation/direction with no assist  Expression Expression assist level: Expresses basic needs/ideas: With no assist  Social Interaction Social Interaction assist level: Interacts appropriately 90% of the time - Needs monitoring or encouragement for participation or interaction.  Problem Solving Problem solving assist level: Solves basic 75 - 89% of the time/requires cueing 10 - 24% of the time  Memory Memory assist level: Recognizes or recalls 75 - 89% of the time/requires cueing 10 - 24% of the time    Medical Problem List and Plan: 1. 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 DVT Prophylaxis/Anticoagulation: SCDs. Monitor for any signs of DVT 3. Pain Management: Hydrocodone as needed 4. Mood: Xanax 0.25 mg daily at bedtime,Lexapro 10 mg daily 5. Neuropsych: This patient is notcapable of making decisions on hisown behalf. 6. Skin/Wound Care: Removed staples 7. Fluids/Electrolytes/Nutrition: Routine I&Os  -encourage PO 8.Seizure prophylaxis. Keppra 500 mg every 12 hours 9.Atrial fibrillation/AVR/CABG/PPM. Continue low-dose aspirin. Amiodarone 200  mg twice a day, Toprol 12.5 mg twice a day. Follow-up per cardiology services 10.Dysphagia. Dysphagia 2 with thin. Follow-up speech therapy  -  cepacol spray for sore throat 11.Acute diastolic congestive heart failure. Lasix  daily. Monitor for any fluid overload  -weights sl increased over last 48 hours---continue QD lasix for now 12.Hypokalemia.   K+ 4.4   Continue supplement  -I personally reviewed the patient's labs today.   13.History of COPD. Check oxygen saturations every shift 14.History of alcohol abuse. Monitor for withdrawal. Provide counseling as appropriate 15.Constipation. Laxative assistance 16. Transaminitis:   Improving  Atorvastatin decreased to 40 on 2/26    17. Leukocytosis  Afebrile, likely steroid induced---weaning steroids to off  WBCs down to 14k on 3/8     Will cont to monitor 18. Macrocytic anemia  Hb 11.0 3/5     Cont Vit supplementation 19. HTN- BPs running low, reduced Toprol-XL to 12 point 5 mg every 24 hours 3/11 Vitals:   01/04/17 0700 01/04/17 0959  BP: 94/65 (!) 100/53  Pulse: 60   Resp: 18   Temp: 98.2 F (36.8 C)      Cont to monitor 20. Urinary retention--voiding incontinently, PVR's low  Bethanechol decreased to 25mg  TID 3/8---decrease to 10mg  , last intermittent catheterization 3/ 9.  discontinue, May be contributing to hypotension  UA?+, Ucx neg. Urine malodorous however------>recollect urine for culture  -make sure patient is on commode or eob to empty  - cath as needed 21. LE edema--improved  Lasix reduced back to 20mg  daily for now  LOS (Days) 16 A FACE TO FACE EVALUATION WAS PERFORMED  Erick Colace 01/04/2017 10:01 AM

## 2017-01-04 NOTE — Progress Notes (Signed)
Physical Therapy Weekly Progress Note  Patient Details  Name: Alex [name] MRN: [id number] Date of Birth: 06/18/1931  Beginning of progress report period: December 28, 2016 End of progress report period: January 04, 2017  Today's Date: 01/04/2017  Patient has met 0 of 2 short term goals.  Pt is making fair progress towards LTG's. Pt continues to fatigue quickly and require multiple rest breaks during sessions. Pt continues to require cuing for intellectual awareness, as he believes "weakness" is his biggest deficit. Pt would benefit from continued skilled PT treatment to address L NMR, balance, gait, endurance, and pt/caregiver education prior to pt's d/c.   Patient continues to demonstrate the following deficits muscle weakness, decreased cardiorespiratoy endurance, impaired timing and sequencing and decreased coordination, decreased visual perceptual skills, decreased attention to left, decreased attention, decreased awareness, decreased problem solving, decreased safety awareness, decreased memory and delayed processing, and decreased sitting balance, decreased standing balance, decreased postural control and decreased balance strategies and therefore will continue to benefit from skilled PT intervention to increase functional independence with mobility.  Patient progressing toward long term goals..  Continue plan of care.  PT Short Term Goals Week 2:  PT Short Term Goal 1 (Week 2): Pt will negotiate 12 steps with min assist for BLE strengthening. PT Short Term Goal 3 (Week 2): Pt will demonstrate intellectual awareness with supervision.  Week 3:  PT Short Term Goal 1 (Week 3): STG = LTG due to estimated d/c date.   Therapy Documentation Precautions:  Precautions Precautions: Fall Precaution Comments: ataxic, poor safety awareness Restrictions Weight Bearing Restrictions: No  See Function Navigator for Current Functional Status.  Therapy/Group: Individual Therapy  Waunita Schooner 01/04/2017, 1:36 PM

## 2017-01-05 ENCOUNTER — Inpatient Hospital Stay (HOSPITAL_COMMUNITY): Payer: Medicare Other | Admitting: Speech Pathology

## 2017-01-05 ENCOUNTER — Inpatient Hospital Stay (HOSPITAL_COMMUNITY): Payer: Medicare Other | Admitting: Occupational Therapy

## 2017-01-05 ENCOUNTER — Inpatient Hospital Stay (HOSPITAL_COMMUNITY): Payer: Medicare Other

## 2017-01-05 ENCOUNTER — Inpatient Hospital Stay (HOSPITAL_COMMUNITY): Payer: Medicare Other | Admitting: Physical Therapy

## 2017-01-05 DIAGNOSIS — N390 Urinary tract infection, site not specified: Secondary | ICD-10-CM

## 2017-01-05 DIAGNOSIS — A499 Bacterial infection, unspecified: Secondary | ICD-10-CM

## 2017-01-05 LAB — URINE CULTURE

## 2017-01-05 MED ORDER — CEPHALEXIN 250 MG PO CAPS
250.0000 mg | ORAL_CAPSULE | Freq: Three times a day (TID) | ORAL | Status: DC
  Administered 2017-01-05 – 2017-01-10 (×16): 250 mg via ORAL
  Filled 2017-01-05 (×17): qty 1

## 2017-01-05 NOTE — Progress Notes (Signed)
Occupational Therapy Session Note  Patient Details  Name: Alex DuhamelJohn R [name] MRN: [id number] Date of Birth: Nov 15, 1930  Today's Date: 01/05/2017 OT Individual Time: 1500-1530 OT Individual Time Calculation (min): 30 min    Short Term Goals: Week 2:  OT Short Term Goal 1 (Week 2): Pt will perform toileting tasks with steady A OT Short Term Goal 2 (Week 2): Pt will perform bathing tasks with min A at shower level OT Short Term Goal 3 (Week 2): Pt will perform LB dressing tasks with mod A OT Short Term Goal 4 (Week 2): Pt will perform grooming tasks standing at sink with steady a  Skilled Therapeutic Interventions/Progress Updates:    Pt seen for OT session focusing on L UE control and functional activity tolerance. Pt sitting up in w/c upon arrival, agreeable to tx session. [facility] and min A.  He completed dynamic standing task utilizing [employer]. Pt stood without AD to complete task, required to use L UE to cross midline and outside BOS- completed with close supervision x5 trials. Pt initially able to tolerate full 1 minute standing trials, however, on last trial pt able to tolerate ~50 seconds before requiring seated rest break. Pt's O2 remained >90% throughout session, HR 120s following standing trial. During rest breaks, discussed d/c planning and return to activity.  Pt returned to room at end of session, left seated in w/c with all needs in reach and QRB donned, in prep for hand off to SLP.  Therapy Documentation Precautions:  Precautions Precautions: Fall Precaution Comments: ataxic, poor safety awareness Restrictions Weight Bearing Restrictions: No Pain: Pain Assessment Faces Pain Scale: No hurt See Function Navigator for Current Functional Status.   Therapy/Group: Individual Therapy  Lewis, Dariela Stoker C 01/05/2017, 12:41 PM

## 2017-01-05 NOTE — Progress Notes (Signed)
At 0030, large incontinent void. Bladder scan=151cc's. Patient doesn't call when wet, urine malodorous. LUE continues to weep. Mild pitting edema to BLE's. Alfredo MartinezMurray, Melissa Pulido A

## 2017-01-05 NOTE — Plan of Care (Signed)
Problem: RH BLADDER ELIMINATION Goal: RH STG MANAGE BLADDER WITH ASSISTANCE STG Manage Bladder With max Assistance - I&O cath  Outcome: Not Progressing No longer requiring I & O cath. Incontinent of urine, requiring total assist with clean up.

## 2017-01-05 NOTE — Progress Notes (Signed)
Speech Language Pathology Daily Session Note  Patient Details  Name: Alex [name] MRN: [id number] Date of Birth: 1931-10-25  Today's Date: 01/05/2017 SLP Individual Time: 0930-1030 SLP Individual Time Calculation (min): 60 min  Short Term Goals: Week 3: SLP Short Term Goal 1 (Week 3): Pt will answer orientation questions with Min A multimodal cues.  SLP Short Term Goal 2 (Week 3): Pt will demonstrate functional problem solving of basic, famaliar tasks with supervision verbal cues.  SLP Short Term Goal 3 (Week 3): Pt will demonstrate selective attention for ~15 minutes in a mildly disctracting enviornment with Min A verbal cues for redirection to tasks. SLP Short Term Goal 4 (Week 3): Pt will consume trials of regular textures with minimal overt s/s of aspiration and Min A verbal cues for complete oral clearing over 2 session prior to upgrade.  SLP Short Term Goal 5 (Week 3): Patient will consume current diet with minimal overt s/s of aspiration with Mod I.   Skilled Therapeutic Interventions: Skilled treatment session focused on cognitive goals. SLP facilitated session by administering the MoCA (version 7.3). Patient scored 18/30 points with a score of 26 or above considered normal. Patient demonstrated deficits in the areas of short-term recall, orientation and executive functioning. Patient demonstrated selective attention to task in a mildly distracting environment for ~45 minutes with supervision verbal cues. Patient left upright in bed with all needs within reach and family present. Continue with current plan of care.      Function:  Cognition Comprehension Comprehension assist level: Follows basic conversation/direction with no assist  Expression   Expression assist level: Expresses basic needs/ideas: With no assist  Social Interaction Social Interaction assist level: Interacts appropriately 90% of the time - Needs monitoring or encouragement for participation or interaction.  Problem  Solving Problem solving assist level: Solves basic 75 - 89% of the time/requires cueing 10 - 24% of the time  Memory Memory assist level: Recognizes or recalls 50 - 74% of the time/requires cueing 25 - 49% of the time    Pain Pain Assessment Faces Pain Scale: No hurt  Therapy/Group: Individual Therapy  Kevyn Wengert 01/05/2017, 12:56 PM

## 2017-01-05 NOTE — Progress Notes (Signed)
Speech Language Pathology Daily Session Note  Patient Details  Name: Alex [name] MRN: [id number] Date of Birth: 26-Nov-1930  Today's Date: 01/05/2017 SLP Individual Time: 1530-1600 SLP Individual Time Calculation (min): 30 min  Short Term Goals: Week 3: SLP Short Term Goal 1 (Week 3): Pt will answer orientation questions with Min A multimodal cues.  SLP Short Term Goal 2 (Week 3): Pt will demonstrate functional problem solving of basic, famaliar tasks with supervision verbal cues.  SLP Short Term Goal 3 (Week 3): Pt will demonstrate selective attention for ~15 minutes in a mildly disctracting enviornment with Min A verbal cues for redirection to tasks. SLP Short Term Goal 4 (Week 3): Pt will consume trials of regular textures with minimal overt s/s of aspiration and Min A verbal cues for complete oral clearing over 2 session prior to upgrade.  SLP Short Term Goal 5 (Week 3): Patient will consume current diet with minimal overt s/s of aspiration with Mod I.   Skilled Therapeutic Interventions: Skilled treatment session focused on addressing cognition goals. SLP facilitated session by providing Mod assist question cues for orientation to month, day, and date.  Patient attempted to look for an external aid; however, one was not present that he could see.  As a result, SLP implemented a monthly calendar that is posted under clock that patient requested SLP mark his discharge date on.  Patient also required Min-Mod question cues to anticipate specific needs at home following discharge home, but was able to state that he will have 24/7 hired help.  Patient required Supervision level verbal cues to recall and safely sequence transfer back to bed.  Continue with current plan of care.    Function:  Cognition Comprehension Comprehension assist level: Follows basic conversation/direction with no assist  Expression   Expression assist level: Expresses basic needs/ideas: With no assist  Social  Interaction Social Interaction assist level: Interacts appropriately 90% of the time - Needs monitoring or encouragement for participation or interaction.  Problem Solving Problem solving assist level: Solves basic 75 - 89% of the time/requires cueing 10 - 24% of the time  Memory Memory assist level: Recognizes or recalls 50 - 74% of the time/requires cueing 25 - 49% of the time    Pain Pain Assessment Pain Assessment: No/denies pain  Therapy/Group: Individual Therapy  Alex FerrettiMelissa Gricel [name], M.A., CCC-SLP [phone]  Alex [name] 01/05/2017, 4:30 PM

## 2017-01-05 NOTE — Progress Notes (Signed)
Occupational Therapy Session Note  Patient Details  Name: Alex [name] MRN: [id number] Date of Birth: 11-Dec-1930  Today's Date: 01/05/2017 OT Individual Time:  - 1300-1405  (65 min)      Short Term Goals: Week 1:  OT Short Term Goal 1 (Week 1): Pt will perform stand pivot transfer with mod A +1 to BSC/ toilet OT Short Term Goal 1 - Progress (Week 1): Met OT Short Term Goal 2 (Week 1): Pt will perform sit to stand for clothing management with  mod A  OT Short Term Goal 2 - Progress (Week 1): Met OT Short Term Goal 3 (Week 1): Pt will perform bed mobility in prep for ADL task with mod A with bed rails and extra time OT Short Term Goal 3 - Progress (Week 1): Met OT Short Term Goal 4 (Week 1): Pt will be oriented x4 with mod external cues OT Short Term Goal 4 - Progress (Week 1): Met Week 2:  OT Short Term Goal 1 (Week 2): Pt will perform toileting tasks with steady A OT Short Term Goal 2 (Week 2): Pt will perform bathing tasks with min A at shower level OT Short Term Goal 3 (Week 2): Pt will perform LB dressing tasks with mod A OT Short Term Goal 4 (Week 2): Pt will perform grooming tasks standing at sink with steady a Week 3:     Skilled Therapeutic Interventions/Progress Updates:    Pt lying in bed eating lunch.  Finished eating and agreed to bath and dress.  Pt went from supine  to EOB with SBA plus rail.  Ambulated to sink with min assist and performed sink bath. Provided set up and min assist for sit to stand.  Ppt verbalized need to toilet.  Performed stand pivot transfer to toilet with rail with min asisst.   Pt continent of bowels.  He performed peri care with Min assist for standing balance.  Completed bathing and dressing at sink.    Pt demonstrated decreased LUE manipulation and  tremor when donning footies..       Therapy Documentation Precautions:  Precautions Precautions: Fall Precaution Comments: ataxic, poor safety awareness Restrictions Weight Bearing Restrictions:  No       Pain: Pain Assessment Faces Pain Scale: No hurt           See Function Navigator for Current Functional Status.   Therapy/Group: Individual Therapy  Lisa Roca 01/05/2017, 12:41 PM

## 2017-01-05 NOTE — Progress Notes (Signed)
[place] PHYSICAL MEDICINE & REHABILITATION     PROGRESS NOTE  Subjective/Complaints:  Feeling well. Had good weekend. Emptying bladder. No respiratory issues.   ROS: pt denies nausea, vomiting, diarrhea, cough, shortness of breath or chest pain     Objective: Vital Signs: Blood pressure 98/61, pulse 61, temperature 97.8 F (36.6 C), temperature source Oral, resp. rate 16, weight 101.3 kg (223 lb 5.2 oz), SpO2 98 %. No results found. No results for input(s): WBC, HGB, HCT, PLT in the last 72 hours. No results for input(s): NA, K, CL, GLUCOSE, BUN, CREATININE, CALCIUM in the last 72 hours.  Invalid input(s): CO CBG (last 3)   Recent Labs  01/02/17 1146 01/02/17 1628  GLUCAP 216* 185*    Wt Readings from Last 3 Encounters:  01/05/17 101.3 kg (223 lb 5.2 oz)  12/16/16 103.2 kg (227 lb 8.2 oz)  12/15/16 105.2 kg (232 lb)    Physical Exam:  BP 98/61 (BP Location: Right Arm)   Pulse 61   Temp 97.8 F (36.6 C) (Oral)   Resp 16   Wt 101.3 kg (223 lb 5.2 oz)   SpO2 98%   BMI 28.67 kg/m  Constitutional: NAD. Well-developed. Vital signs reviewed.  Mouth-0ral mucosa pink/moist HENT: scalp incision dry Eyes: EOMI. No discharge.  Cardiovascular:IRR, IRR Respiratory: CTA B GI: Soft. Bowel sounds are normal.  Musc: No tenderness. no edema Neurological.Alert and oriented x3/3 with cues.   Motor: B/l UE 4/5 prox to distal.  B/l LE: 4+/5 proximal to distal--motor exam stable. Skin: Warm and dry.   Psych: pleasant and cooperative.    Assessment/Plan: 1. Functional deficits secondary to SDH sp craniotomy which require 3+ hours per day of interdisciplinary therapy in a comprehensive inpatient rehab setting. Physiatrist is providing close team supervision and 24 hour management of active medical problems listed below. Physiatrist and rehab team continue to assess barriers to discharge/monitor patient progress toward functional and medical  goals.  Function:  Bathing Bathing position Bathing activity did not occur: Refused Position: Wheelchair/chair at sink  Bathing parts Body parts bathed by patient: Right arm, Left arm, Chest, Abdomen, Front perineal area, Buttocks Body parts bathed by helper: Back  Bathing assist Assist Level: Supervision or verbal cues      Upper Body Dressing/Undressing Upper body dressing Upper body dressing/undressing activity did not occur: Refused What is the patient wearing?: Pull over shirt/dress     Pull over shirt/dress - Perfomed by patient: Thread/unthread right sleeve, Pull shirt over trunk, Put head through opening, Thread/unthread left sleeve Pull over shirt/dress - Perfomed by helper: Thread/unthread left sleeve        Upper body assist Assist Level: Supervision or verbal cues      Lower Body Dressing/Undressing Lower body dressing   What is the patient wearing?: Pants, Non-skid slipper socks, Ted Hose Underwear - Performed by patient: Thread/unthread right underwear leg, Thread/unthread left underwear leg, Pull underwear up/down Underwear - Performed by helper: Thread/unthread left underwear leg Pants- Performed by patient: Thread/unthread right pants leg, Thread/unthread left pants leg, Pull pants up/down Pants- Performed by helper: Thread/unthread right pants leg, Thread/unthread left pants leg Non-skid slipper socks- Performed by patient: Don/doff right sock, Don/doff left sock Non-skid slipper socks- Performed by helper: Don/doff right sock, Don/doff left sock   Socks - Performed by helper: Don/doff right sock           TED Hose - Performed by helper: Don/doff right TED hose, Don/doff left TED hose  Lower body assist Assist  for lower body dressing: Touching or steadying assistance (Pt > 75%)      Toileting Toileting Toileting activity did not occur: No continent bowel/bladder event Toileting steps completed by patient: Performs perineal hygiene Toileting steps  completed by helper: Adjust clothing prior to toileting, Adjust clothing after toileting Toileting Assistive Devices: Grab bar or rail  Toileting assist Assist level: Touching or steadying assistance (Pt.75%)   Transfers Chair/bed transfer   Chair/bed transfer method: Stand pivot Chair/bed transfer assist level: Touching or steadying assistance (Pt > 75%) Chair/bed transfer assistive device: Armrests     Locomotion Ambulation     Max distance: 120ft Assist level: Supervision or verbal cues   Wheelchair   Type: Manual Max wheelchair distance: 70 Assist Level: Supervision or verbal cues  Cognition Comprehension Comprehension assist level: Follows basic conversation/direction with no assist  Expression Expression assist level: Expresses basic needs/ideas: With no assist  Social Interaction Social Interaction assist level: Interacts appropriately 90% of the time - Needs monitoring or encouragement for participation or interaction.  Problem Solving Problem solving assist level: Solves basic 75 - 89% of the time/requires cueing 10 - 24% of the time  Memory Memory assist level: Recognizes or recalls 75 - 89% of the time/requires cueing 10 - 24% of the time    Medical Problem List and Plan: 1. 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 DVT Prophylaxis/Anticoagulation: SCDs. Monitor for any signs of DVT 3. Pain Management: Hydrocodone as needed 4. Mood: Xanax 0.25 mg daily at bedtime,Lexapro 10 mg daily 5. Neuropsych: This patient is notcapable of making decisions on hisown behalf. 6. Skin/Wound Care: Removed staples 7. Fluids/Electrolytes/Nutrition: Routine I&Os  -encourage PO 8.Seizure prophylaxis. Keppra 500 mg every 12 hours 9.Atrial fibrillation/AVR/CABG/PPM. Continue low-dose aspirin. Amiodarone 200 mg twice a day, Toprol 12.5  mg twice a day. Follow-up per cardiology services 10.Dysphagia. Dysphagia 2 with thin. Follow-up speech therapy  -  cepacol spray for sore throat 11.Acute diastolic congestive heart failure.     -weights holding around 101kg-continue QD lasix for now 12.Hypokalemia.   K+ 4.4   Continue supplement  13.History of COPD. Check oxygen saturations every shift 14.History of alcohol abuse. Monitor for withdrawal. Provide counseling as appropriate 15.Constipation. Laxative assistance 16. Transaminitis:   Improving  Atorvastatin decreased to 40 on 2/26    17. Leukocytosis  Afebrile, likely steroid induced---weaning steroids to off  WBCs down to 14k on 3/8     Will cont to monitor 18. Macrocytic anemia  Hb 11.0 3/5     Cont Vit supplementation 19. HTN- BPs running low, reduced Toprol-XL to 12. 5 mg every 24 hours 3/11 Vitals:   01/04/17 1400 01/05/17 0500  BP: 102/68 98/61  Pulse: 60 61  Resp: 20 16  Temp: 97.8 F (36.6 C) 97.8 F (36.6 C)     Cont to monitor 20. Urinary retention--voiding incontinently, PVR's low  Urecholine stopped  Urine malodorous   -ucx + for 100k ecoli and 50k klebsiella---begin keflex for 7 days  -make sure patient is on commode or eob to empty  - cath PRN 21. LE edema--improved  Lasix reduced back to 20mg  daily for now  LOS (Days) 17 A FACE TO FACE EVALUATION WAS PERFORMED  Alex [name] T 01/05/2017 9:22 AM

## 2017-01-05 NOTE — Progress Notes (Signed)
Physical Therapy Session Note  Patient Details  Name: Alex DuhamelJohn R [name] MRN: [id number] Date of Birth: 1931-03-02  Today's Date: 01/05/2017 PT Individual Time: 1405-1500 PT Individual Time Calculation (min): 55 min   Short Term Goals: Week 3:  PT Short Term Goal 1 (Week 3): STG = LTG due to estimated d/c date.   Skilled Therapeutic Interventions/Progress Updates:  Pt received in handoff from OT & agreeable to tx. Pt performed BLE strengthening exercises consisting of hip/knee flexion and heel raises with multimodal cuing for proper technique. Provided education & demonstration for stair negotiation with R rail only. Pt return demonstrated task 2 trials (4 steps + 4 steps) with rest break in between. Pt requires min assist to negotiate stairs in this manner 2/2 LLE weakness. Pt also experienced 1 LOB 2/2 L knee buckling requiring min assist to prevent LOB during task. Educated pt on need for pt/caregiver training prior to d/c, as well as potential need for w/c use in community 2/2 poor endurance. Pt participated in catching & tossing beach ball with BUE with task focusing on coordination and NMR of LUE; pt with slowly improving coordination as task progressed. Pt ambulated ~80 ft with RW & steady assist/supervision for endurance training with pt experiencing multiple slight losses of balance. At end of session pt left in w/c in room with all needs within reach and OT arriving.  During session therapist educated pt on pursed lip breathing as well as need for 24 hr supervision at home. Pt also demonstrates impulsivity with stand>sit transfers despite cuing/education for safety.   Therapy Documentation Precautions:  Precautions Precautions: Fall Precaution Comments: ataxic, poor safety awareness Restrictions Weight Bearing Restrictions: No  Pain: No c/o pain.   See Function Navigator for Current Functional Status.   Therapy/Group: Individual Therapy  Sandi MariscalVictoria M Nasteho Glantz 01/05/2017, 4:44 PM

## 2017-01-06 ENCOUNTER — Inpatient Hospital Stay (HOSPITAL_COMMUNITY): Payer: Medicare Other | Admitting: Occupational Therapy

## 2017-01-06 ENCOUNTER — Inpatient Hospital Stay (HOSPITAL_COMMUNITY): Payer: Medicare Other | Admitting: Physical Therapy

## 2017-01-06 ENCOUNTER — Inpatient Hospital Stay (HOSPITAL_COMMUNITY): Payer: Medicare Other | Admitting: Speech Pathology

## 2017-01-06 NOTE — Progress Notes (Signed)
Physical Therapy Session Note  Patient Details  Name: Jetty DuhamelJohn R Metzinger MRN: [id number] Date of Birth: 1931/01/10  Today's Date: 01/06/2017 PT Individual Time: 1200-1255 PT Individual Time Calculation (min): 55 min    Skilled Therapeutic Interventions/Progress Updates:    Treatment focused on use of implicit and explicit memory strategies to assist in improving safety with mobility.  Pt performed sit to stand from stable surface and hospital bed.  During transfers, pt shows impulsivity and requires cues for UE placement and safe walker use.  Additionally, worked on standing balance tolerance while putting beads in daily pill container.  Pt additionally walked self selected distances from 15 ft to 40 ft with Min A using RW working on safety with transfers.  Pt returned to bed with rails up and alarm on, wife in room, and call bell/phone in reach.  Therapy Documentation Precautions:  Precautions Precautions: Fall Precaution Comments: ataxic, poor safety awareness Restrictions Weight Bearing Restrictions: (P) No   Vital Signs:  Spo2>99% t/o session despite shortness of breath.  Hr:  85-96 bpm     See Function Navigator for Current Functional Status.   Therapy/Group: Individual Therapy  Cella Cappello Elveria RisingM Hessie Varone 01/06/2017, 1:22 PM

## 2017-01-06 NOTE — Progress Notes (Signed)
Occupational Therapy Session Note  Patient Details  Name: Alex [name] MRN: [id number] Date of Birth: September 20, 1931  Today's Date: 01/06/2017 OT Individual Time: 1406-1530 OT Individual Time Calculation (min): 84 min    Short Term Goals: Week 2:  OT Short Term Goal 1 (Week 2): Pt will perform toileting tasks with steady A OT Short Term Goal 2 (Week 2): Pt will perform bathing tasks with min A at shower level OT Short Term Goal 3 (Week 2): Pt will perform LB dressing tasks with mod A OT Short Term Goal 4 (Week 2): Pt will perform grooming tasks standing at sink with steady a  Skilled Therapeutic Interventions/Progress Updates:    Pt transferred from the bed to the shower with min assist, using the RW.   Max instructional cueing for sequencing bathing as pt only ran water on him with the hand held shower to start and then attempted to turn it off, before needing re-direction for therapist.  Step by step cueing was needed for washing thoroughly.  He was able to transfer to the wheelchair with min assist using the RW after completion of drying off.  Dressing sit to stand at the sink.  Pt needed max demonstrational cueing for donning pullover shorts as he would repeatedly attempt to place the LLE in the right leg hole and then would continue to have the disoriented.  Eventually, he was able to donn them with min guard assist sit to stand.   Increased time for donning shirt secondary to not starting with the impaired LUE.  He was able to complete with supervision and min instructional cueing but not with an efficient method.  Worked on shaving in sitting at the sink as well.  He was able to use the LUE for reaching to obtain the shaving cream.  Min assist needed to activate the canister with his left index finger.  He completed 80% of shaving with the RUE but needed therapist assist for thoroughness on the left side of his face.  Once dressing and grooming were completed, therapist rolled pt to the [facility] gym  for use of the [employer].  Had pt work in standing to locate red buttons in all quadrants.  First interval he demonstrated overall reaction time of 2.6 seconds with no signs of inattention.  Second set a timer for each light was used consisting of 3 seconds.  He was able to press 28/29 with an average reaction time of 1.93 seconds.  Both of the first 2 sets were completed with use of the RUE most of the time.  Last 2 intervals were completed for 1 min each with isolated use of the LUE only.  Average time for first set was 2.07 seconds with pt being able to press 18/25 in under 3 seconds.  Last set was 18/26 with an average of 1.94 seconds.  Decreased ataxia and motor planning noted when attempting to activate button with isolated finger movements.  Returned to room at end of session with safety belt in place and call button in reach on the left side.  Pt with visitor present as well.  He was able to show therapist correct button to be pressed if he wanted to go back to bed.   Therapy Documentation Precautions:  Precautions Precautions: Fall Precaution Comments:  decreased motor planning Restrictions Weight Bearing Restrictions: (P) No  Pain: Pain Assessment Pain Assessment: No/denies pain ADL: See Function Navigator for Current Functional Status.   Therapy/Group: Individual Therapy  Destin Kittler  OTR/L  01/06/2017, 4:47 PM

## 2017-01-06 NOTE — Patient Care Conference (Addendum)
Inpatient RehabilitationTeam Conference and Plan of Care Update Date: 01/07/2017   Time: 2:35 PM    Patient Name: Alex [name]      Medical Record Number: [id number]  Date of Birth: 08-Mar-1931 Sex: Male         Room/Bed: 4W13C/4W13C-01 Payor Info: Payor: [employer]vertising copy[employer] MEDICARE / Plan: [facility] MEDICARE / Product Type: *No Product type* /    Admitting Diagnosis: SDH  Admit Date/Time:  12/19/2016  6:18 PM Admission Comments: No comment available   Primary Diagnosis:  Traumatic brain injury with loss of consciousness of 31 minutes to 59 minutes (HCC) Principal Problem: Traumatic brain injury with loss of consciousness of 31 minutes to 59 minutes [facility])  Patient Active Problem List   Diagnosis Date Noted  . Peripheral edema   . Bacterial UTI   . Urinary retention   . Hypertension   . Reactive hypertension   . Transaminitis   . Traumatic brain injury with loss of consciousness of 31 minutes to 59 minutes (HCC) 12/19/2016  . Acute pulmonary edema (HCC) 12/12/2016  . Acute diastolic CHF (congestive heart failure) (HCC) 12/12/2016  . Atrial flutter (HCC) 12/12/2016  . Traumatic subdural bleed with LOC of 1 hour to 5 hours 59 minutes (HCC) 12/11/2016  . Class 1 obesity due to excess calories with body mass index (BMI) of 30.0 to 30.9 in adult   . ETOH abuse   . Chronic obstructive pulmonary disease (HCC)   . Supplemental oxygen dependent   . Alcohol use   . OSA on CPAP   . Atrial fibrillation (HCC)   . Coronary artery disease involving coronary bypass graft of native heart without angina pectoris   . Seizures (HCC)   . Dysphagia   . Bradycardia   . Hyperglycemia   . Agitation   . Hypokalemia   . Hypernatremia   . Leukocytosis   . Macrocytic anemia   . Thrombocytopenia (HCC)   . Traumatic subdural hematoma without loss of consciousness (HCC)   . Change in mental status   . Subdural hematoma (HCC) 12/03/2016  . Sick sinus syndrome (HCC) 11/06/2015  . PAF (paroxysmal atrial  fibrillation) (HCC) 11/14/2014  . SOB (shortness of breath) 10/31/2014  . Persistent atrial fibrillation (HCC) 10/31/2014  . Aortic stenosis, severe 07/28/2011  . 3-vessel coronary artery disease 07/28/2011  . Hyperlipidemia, mixed 07/28/2011    Expected Discharge Date: Expected Discharge Date: 01/10/17  Team Members Present: Physician leading conference: Dr. Faith Rogue Social Worker Present: Amada Jupiter, LCSW Nurse Present: Carmie End, RN PT Present: Aleda Grana, PT OT Present: Roney Mans, OT SLP Present: Feliberto Gottron, SLP     Current Status/Progress Goal Weekly Team Focus  Medical   improved cognition, bladder now emptying--rxing UTI. weight and volume appear balanced  improve activity tolerance  volume mgt, rx of uti, bladder emptying   Bowel/Bladder   incontinent of bladder, pvr's q shift and prn cath, continent of bowel LBM 01-04-17  Continent of bowel and bladder with min assistanc  Assess bowel and bladder pattern q shift and prn, continue timed tolieting   Swallow/Nutrition/ Hydration   regular textures with thin liquids, supervision-Mod I   Min A with least restrictive diet  Tolerance of current diet, family education   ADL's   Pt currently min assist overall for bathing tasks with max instructional cueing for sequencing.  Min assist to supervision for dressing tasks with cueing for orientation of clothing and sequencing as well.  Min guard for transfers using the RW.  Still with decreased coordination and functional use of the LUE, but uses at a diminshed level for ADL tasks.   supervision overall  selfcare retraining, transfer training, balance retraining, cognitive retraining, pt/family education, neuromuscular re-education,    Mobility   steady assist/supervision overall with RW for functional mobility  supervision overall  L NMR, cognitive remediation, endurance, gait, balance, pt/caregiver education   Communication             Safety/Cognition/  Behavioral Observations  Min-Mod A  Min A  recall and problem solving, family education    Pain   no c/o pain  no c/o pain  Assess pain q shift and prn   Skin   incision to scalp OTA,  no new skin breakdown/infection  Assess skin q shift and prn    Rehab Goals Patient on target to meet rehab goals: Yes *See Care Plan and progress notes for long and short-term goals.  Barriers to Discharge: activity tolerance, safety awareness    Possible Resolutions to Barriers:  ongoing therapy, family ed. adaptive equipment, pacing    Discharge Planning/Teaching Needs:  Home with spouse who can provide supervision/  FAmily education planned for tomorrow morning.   Team Discussion:  Emptying bladder now;  Continue to monitor daily weight.  Upgraded diet to regular.  Reaching supervision goals overall.  Review of d/c f/u and DME needs.  Family ed tomorrow morning.  Revisions to Treatment Plan:  None   Continued Need for Acute Rehabilitation Level of Care: The patient requires daily medical management by a physician with specialized training in physical medicine and rehabilitation for the following conditions: Daily direction of a multidisciplinary physical rehabilitation program to ensure safe treatment while eliciting the highest outcome that is of practical value to the patient.: Yes Daily medical management of patient stability for increased activity during participation in an intensive rehabilitation regime.: Yes Daily analysis of laboratory values and/or radiology reports with any subsequent need for medication adjustment of medical intervention for : Post surgical problems;Neurological problems;Cardiac problems  Khaled Herda 01/07/2017, 9:06 AM

## 2017-01-06 NOTE — Progress Notes (Signed)
Speech Language Pathology Daily Session Note  Patient Details  Name: Alex DuhamelJohn R [name] MRN: [id number] Date of Birth: 24-Jan-1931  Today's Date: 01/06/2017 SLP Individual Time: 1100-1200 SLP Individual Time Calculation (min): 60 min  Short Term Goals: Week 3: SLP Short Term Goal 1 (Week 3): Pt will answer orientation questions with Min A multimodal cues.  SLP Short Term Goal 2 (Week 3): Pt will demonstrate functional problem solving of basic, famaliar tasks with supervision verbal cues.  SLP Short Term Goal 3 (Week 3): Pt will demonstrate selective attention for ~15 minutes in a mildly disctracting enviornment with Min A verbal cues for redirection to tasks. SLP Short Term Goal 4 (Week 3): Pt will consume trials of regular textures with minimal overt s/s of aspiration and Min A verbal cues for complete oral clearing over 2 session prior to upgrade.  SLP Short Term Goal 5 (Week 3): Patient will consume current diet with minimal overt s/s of aspiration with Mod I.   Skilled Therapeutic Interventions: Skilled treatment session focused on cognitive and dysphagia goals. Patient was independently oriented to time, place and situation and recalled events from previous therapy sessions with Min A verbal cues. Patient consumed trial tray of regular textures and demonstrated efficient mastication and oral clearance without overt s/s of aspiration. Therefore, recommend patient upgrade to regular textures. Patient performed tray setup with Mod I but required supervision verbal cues to attend to left anterior spillage. Patient left upright in wheelchair with all needs within reach. Continue with current plan of care.      Function:  Eating Eating   Modified Consistency Diet: No Eating Assist Level: Supervision or verbal cues           Cognition Comprehension Comprehension assist level: Follows basic conversation/direction with no assist  Expression   Expression assist level: Expresses basic  needs/ideas: With no assist  Social Interaction Social Interaction assist level: Interacts appropriately 90% of the time - Needs monitoring or encouragement for participation or interaction.  Problem Solving Problem solving assist level: Solves basic 75 - 89% of the time/requires cueing 10 - 24% of the time  Memory Memory assist level: Recognizes or recalls 75 - 89% of the time/requires cueing 10 - 24% of the time    Pain No/Denies Pain   Therapy/Group: Individual Therapy  Sora Vrooman 01/06/2017, 3:38 PM

## 2017-01-06 NOTE — Progress Notes (Signed)
[facility]IC[facility] & RE[facility]ABILITATION     PROGRESS NOTE  Subjective/Complaints:  Feeling very well. Very thankful to the team for his recovery  ROS: pt denies nausea, vomiting, diarrhea, cough, shortness of breath or chest pain      Objective: Vital Signs: Blood pressure 104/67, pulse 64, temperature 97.8 F (36.6 C), temperature source Oral, resp. rate 18, weight 94 kg (207 lb 3.7 oz), SpO2 97 %. No results found. No results for input(s): WBC, HGB, HCT, PLT in the last 72 hours. No results for input(s): NA, K, CL, GLUCOSE, BUN, CREATININE, CALCIUM in the last 72 hours.  Invalid input(s): CO CBG (last 3)  No results for input(s): GLUCAP in the last 72 hours.  Wt Readings from Last 3 Encounters:  01/06/17 94 kg (207 lb 3.7 oz)  12/16/16 103.2 kg (227 lb 8.2 oz)  12/15/16 105.2 kg (232 lb)    Physical Exam:  BP 104/67 (BP Location: Right Arm)   Pulse 64   Temp 97.8 F (36.6 C) (Oral)   Resp 18   Wt 94 kg (207 lb 3.7 oz)   SpO2 97%   BMI 26.61 kg/m  Constitutional: NAD. Well-developed. Vital signs reviewed.  Mouth-0ral mucosa pink/moist HENT: scalp incision dry Eyes: EOMI. No discharge.  Cardiovascular: IRR IRR Respiratory: clear, normal effort GI: Soft. Bowel sounds are normal.  Musc: No tenderness. No edema Neurological.Alert and oriented to person, place, month, reason   Motor: B/l UE 4/5 prox to distal.  B/l LE: 4+/5 proximal to distal--motor exam stable. Skin: Warm and dry.   Psych: pleasant and cooperative.    Assessment/Plan: 1. Functional deficits secondary to SDH sp craniotomy which require 3+ hours per day of interdisciplinary therapy in a comprehensive inpatient rehab setting. Physiatrist is providing close team supervision and 24 hour management of active medical problems listed below. Physiatrist and rehab team continue to assess barriers to discharge/monitor patient progress toward functional and medical  goals.  Function:  Bathing Bathing position Bathing activity did not occur: Refused Position: Wheelchair/chair at sink  Bathing parts Body parts bathed by patient: Right arm, Left arm, Chest, Abdomen, Front perineal area, Buttocks, Right upper leg, Left upper leg Body parts bathed by helper: Back  Bathing assist Assist Level: Supervision or verbal cues      Upper Body Dressing/Undressing Upper body dressing Upper body dressing/undressing activity did not occur: Refused What is the patient wearing?: Pull over shirt/dress     Pull over shirt/dress - Perfomed by patient: Thread/unthread right sleeve, Pull shirt over trunk, Put head through opening, Thread/unthread left sleeve Pull over shirt/dress - Perfomed by helper: Thread/unthread left sleeve        Upper body assist Assist Level: Supervision or verbal cues      Lower Body Dressing/Undressing Lower body dressing   What is the patient wearing?: Pants, Non-skid slipper socks, Ted Hose Underwear - Performed by patient: Thread/unthread right underwear leg, Thread/unthread left underwear leg, Pull underwear up/down Underwear - Performed by helper: Thread/unthread left underwear leg Pants- Performed by patient: Thread/unthread right pants leg, Thread/unthread left pants leg, Pull pants up/down Pants- Performed by helper: Thread/unthread left pants leg Non-skid slipper socks- Performed by patient: Don/doff right sock, Don/doff left sock Non-skid slipper socks- Performed by helper: Don/doff right sock, Don/doff left sock   Socks - Performed by helper: Don/doff right sock           TED Hose - Performed by helper: Don/doff right TED hose, Don/doff left TED hose  Lower  body assist Assist for lower body dressing: Touching or steadying assistance (Pt > 75%)      Toileting Toileting Toileting activity did not occur: No continent bowel/bladder event Toileting steps completed by patient: Adjust clothing prior to toileting, Performs  perineal hygiene Toileting steps completed by helper: Adjust clothing after toileting Toileting Assistive Devices: Grab bar or rail  Toileting assist Assist level: Touching or steadying assistance (Pt.75%)   Transfers Chair/bed transfer   Chair/bed transfer method: Stand pivot Chair/bed transfer assist level: Touching or steadying assistance (Pt > 75%) Chair/bed transfer assistive device: Armrests     Locomotion Ambulation     Max distance: 80 ft Assist level: Touching or steadying assistance (Pt > 75%)   Wheelchair   Type: Manual Max wheelchair distance: 70 Assist Level: Supervision or verbal cues  Cognition Comprehension Comprehension assist level: Follows basic conversation/direction with no assist  Expression Expression assist level: Expresses basic needs/ideas: With no assist  Social Interaction Social Interaction assist level: Interacts appropriately 90% of the time - Needs monitoring or encouragement for participation or interaction.  Problem Solving Problem solving assist level: Solves basic 75 - 89% of the time/requires cueing 10 - 24% of the time  Memory Memory assist level: Recognizes or recalls 50 - 74% of the time/requires cueing 25 - 49% of the time    Medical Problem List and Plan: 1. 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 DVT Prophylaxis/Anticoagulation: SCDs. Monitor for any signs of DVT 3. Pain Management: Hydrocodone as needed 4. Mood: Xanax 0.25 mg daily at bedtime,Lexapro 10 mg daily 5. Neuropsych: This patient is notcapable of making decisions on hisown behalf. 6. Skin/Wound Care: Removed staples 7. Fluids/Electrolytes/Nutrition: Routine I&Os  -encourage PO 8.Seizure prophylaxis. Keppra 500 mg every 12 hours 9.Atrial fibrillation/AVR/CABG/PPM. Continue low-dose aspirin.  Amiodarone 200 mg twice a day, Toprol 12.5 mg twice a day. Follow-up per cardiology services 10.Dysphagia. Dysphagia 2 with thin. Follow-up speech therapy  -  cepacol spray for sore throat 11.Acute diastolic congestive heart failure.     -weights holding around 101kg-continue QD lasix for now  -re-measure weight today 12.Hypokalemia.   K+ 4.4   Continue supplement  13.History of COPD. Check oxygen saturations every shift 14.History of alcohol abuse. Monitor for withdrawal. Provide counseling as appropriate 15.Constipation. Laxative assistance 16. Transaminitis:   Improving  Atorvastatin decreased to 40 on 2/26    17. Leukocytosis  Afebrile, likely steroid induced---weaning steroids to off  WBCs down to 14k on 3/8     Will cont to monitor 18. Macrocytic anemia  Hb 11.0 3/5     Cont Vit supplementation 19. HTN- BPs running low, reduced Toprol-XL to 12. 5 mg every 24 hours 3/11 Vitals:   01/05/17 0500 01/06/17 0514  BP: 98/61 104/67  Pulse: 61 64  Resp: 16 18  Temp: 97.8 F (36.6 C) 97.8 F (36.6 C)     Cont to monitor 20. Urinary retention--voiding incontinently, PVR's low  Urecholine stopped  Urine malodorous   -ucx + for 100k ecoli and 50k klebsiella---continue keflex for 7 days  -make sure patient is on commode or eob to empty  - cath PRN 21. LE edema--improved  Lasix--continue at 20mg  daily for now  LOS (Days) 18 A FACE TO FACE EVALUATION WAS PERFORMED  Meghann Landing T 01/06/2017 9:49 AM

## 2017-01-06 NOTE — Progress Notes (Signed)
Physical Therapy Session Note  Patient Details  Name: Alex DuhamelJohn R Pauli MRN: [id number] Date of Birth: 07/03/1931  Today's Date: 01/06/2017 PT Individual Time: 0904-1002 PT Individual Time Calculation (min): 58 min   Short Term Goals: Week 3:  PT Short Term Goal 1 (Week 3): STG = LTG due to estimated d/c date.   Skilled Therapeutic Interventions/Progress Updates:  Pt received in bed requesting to finish breakfast. While pt did so, therapist provided pt with energy conservation handout and reviewed various methods for conserving his energy upon d/c; pt very eager to learn information. Upon questioning pt reports he is not incontinent despite multiple episodes of incontinence during hospital stay. Encouraged pt to work towards improved bladder control and pt reported "I don't try to control it". With further discussion pt reports he does not attempt to call for assistance to the bathroom because it is so tiring to get out of bed & ambulate to bathroom. Therapist provided max encouragement and education regarding importance of attempting to improve bladder control and continue practicing going to bathroom for toileting purposes; pt voiced understanding and RN made aware. Pt transferred supine>sitting EOB & noted to be incontinent of urine. Pt completed multiple sit<>stand transfers with RW & supervision in order to manage clothing. Pt required assistance to thread brief and pants on BLE and to fully pull them both over hips. Pt required frequent rest breaks 2/2 fatigue & SOB during dressing task. Pt transferred bed>w/c with RW & cuing for safety & correct hand placement as pt will attempt to transfer with 1 hand on RW & 1 hand on bed rail. Transported pt to rehab gym & pt completed bed mobility in regular bed with supervision. Pt reports his bed is low but that he can elevate bed with remote control and therapist encouraged him to do so. At end of session pt left sitting in w/c in room with QRB & chair alarm  donned, all needs within reach & wife present to supervise.   Therapy Documentation Precautions:  Precautions Precautions: Fall Precaution Comments: ataxic, poor safety awareness Restrictions Weight Bearing Restrictions: (P) No  Pain: No c/o pain.   See Function Navigator for Current Functional Status.   Therapy/Group: Individual Therapy  Sandi MariscalVictoria M Gurshan Settlemire 01/06/2017, 12:07 PM

## 2017-01-07 ENCOUNTER — Inpatient Hospital Stay (HOSPITAL_COMMUNITY): Payer: Medicare Other | Admitting: Physical Therapy

## 2017-01-07 ENCOUNTER — Inpatient Hospital Stay (HOSPITAL_COMMUNITY): Payer: Medicare Other | Admitting: Occupational Therapy

## 2017-01-07 ENCOUNTER — Inpatient Hospital Stay (HOSPITAL_COMMUNITY): Payer: Medicare Other | Admitting: Speech Pathology

## 2017-01-07 ENCOUNTER — Encounter (HOSPITAL_COMMUNITY): Payer: Medicare Other

## 2017-01-07 NOTE — Progress Notes (Signed)
Occupational Therapy Session Note  Patient Details  Name: Alex DuhamelJohn R [name] MRN: [id number] Date of Birth: [birth year]  Today's Date: 01/07/2017 OT Individual Time: 0830-0900 OT Individual Time Calculation (min): 30 min    Short Term Goals: Week 2:  OT Short Term Goal 1 (Week 2): Pt will perform toileting tasks with steady A OT Short Term Goal 2 (Week 2): Pt will perform bathing tasks with min A at shower level OT Short Term Goal 3 (Week 2): Pt will perform LB dressing tasks with mod A OT Short Term Goal 4 (Week 2): Pt will perform grooming tasks standing at sink with steady a  Skilled Therapeutic Interventions/Progress Updates:    1:1 Pt in bed finishing breakfast when arrived.Performed dressing at EOB with focus on orientation of clothing, functional use and coordination of left UE with min to mod cuing to visually attend to UE's function and activity tolerance. Pt required setup to orient clothing to don properly. Pt continues to demonstrate decr coordination of left UE with threading and moving too quickly - shirt coming off left UE when moving on to threading right sleeve. Pt able to thread and pull up pants with setup and more than reasonable amt of time. PT able to don shoes but requiring A to tie to decr coordination. Wife express concern their bed doesn't have bed rails. Performed bed mobility at height of 27 inches without bed rails with supervision without bed rails. Pt stood to wash face at sink with supervision but requested to sit t brush teeth/ clean dentures due to fatigue. Discussed with pt and wife about fatigue and making modifications at home. Wife reports an agency is coming to meet the pt to see if they can provide hrs for hired 24/7 care. Left pt sitting EOB with next therapist.   Therapy Documentation Precautions:  Precautions Precautions: Fall Precaution Comments:  decreased motor planning Restrictions Weight Bearing Restrictions: No General:   Vital Signs: Therapy  Vitals Pulse Rate: 76 BP: 110/78 Pain:  no c/o pain in session  See Function Navigator for Current Functional Status.   Therapy/Group: Individual Therapy  Roney MansSmith, Mikenna Bunkley [facility] 01/07/2017, 11:27 AM

## 2017-01-07 NOTE — Progress Notes (Signed)
[facility]ICAL MEDICINE & RE[facility]ABILITATION     PROGRESS NOTE  Subjective/Complaints:  Feeling very well. Very thankful to the team for his recovery  ROS: pt denies nausea, vomiting, diarrhea, cough, shortness of breath or chest pain      Objective: Vital Signs: Blood pressure 110/78, pulse 76, temperature 98.6 F (37 C), temperature source Oral, resp. rate 16, weight 92 kg (202 lb 12.8 oz), SpO2 98 %. No results found. No results for input(s): WBC, HGB, HCT, PLT in the last 72 hours. No results for input(s): NA, K, CL, GLUCOSE, BUN, CREATININE, CALCIUM in the last 72 hours.  Invalid input(s): CO CBG (last 3)  No results for input(s): GLUCAP in the last 72 hours.  Wt Readings from Last 3 Encounters:  01/07/17 92 kg (202 lb 12.8 oz)  12/16/16 103.2 kg (227 lb 8.2 oz)  12/15/16 105.2 kg (232 lb)    Physical Exam:  BP 110/78   Pulse 76   Temp 98.6 F (37 C) (Oral)   Resp 16   Wt 92 kg (202 lb 12.8 oz)   SpO2 98%   BMI 26.04 kg/m  Constitutional: NAD. Well-developed. Vital signs reviewed.  Mouth-0ral mucosa pink/moist HENT: scalp incision dry Eyes: EOMI. No discharge.  Cardiovascular: IRR IRR Respiratory: clear, normal effort GI: Soft. Bowel sounds are normal.  Musc: No tenderness. No edema Neurological.Alert and oriented to person, place, month, reason   Motor: B/l UE 4/5 prox to distal.  B/l LE: 4+/5 proximal to distal--motor exam stable. Skin: Warm and dry.   Psych: pleasant and cooperative.    Assessment/Plan: 1. Functional deficits secondary to SDH sp craniotomy which require 3+ hours per day of interdisciplinary therapy in a comprehensive inpatient rehab setting. Physiatrist is providing close team supervision and 24 hour management of active medical problems listed below. Physiatrist and rehab team continue to assess barriers to discharge/monitor patient progress toward functional and medical goals.  Function:  Bathing Bathing position Bathing  activity did not occur: Refused Position: Systems developer parts bathed by patient: Right arm, Left arm, Chest, Abdomen, Front perineal area, Buttocks, Right upper leg, Left upper leg, Right lower leg, Left lower leg Body parts bathed by helper: Back  Bathing assist Assist Level: Supervision or verbal cues      Upper Body Dressing/Undressing Upper body dressing Upper body dressing/undressing activity did not occur: Refused What is the patient wearing?: Pull over shirt/dress     Pull over shirt/dress - Perfomed by patient: Thread/unthread right sleeve, Pull shirt over trunk, Put head through opening, Thread/unthread left sleeve Pull over shirt/dress - Perfomed by helper: Thread/unthread left sleeve        Upper body assist Assist Level: Supervision or verbal cues      Lower Body Dressing/Undressing Lower body dressing   What is the patient wearing?: Underwear, Pants, Non-skid slipper socks, Ted Hose Underwear - Performed by patient: Thread/unthread right underwear leg, Thread/unthread left underwear leg, Pull underwear up/down Underwear - Performed by helper: Thread/unthread left underwear leg Pants- Performed by patient: Thread/unthread right pants leg, Thread/unthread left pants leg, Pull pants up/down Pants- Performed by helper: Thread/unthread left pants leg Non-skid slipper socks- Performed by patient: Don/doff right sock, Don/doff left sock Non-skid slipper socks- Performed by helper: Don/doff right sock, Don/doff left sock   Socks - Performed by helper: Don/doff right sock Shoes - Performed by patient: Don/doff right shoe, Don/doff left shoe Shoes - Performed by helper: Fasten right, Fasten left  TED Hose - Performed by helper: Don/doff right TED hose, Don/doff left TED hose  Lower body assist Assist for lower body dressing: Supervision or verbal cues      Toileting Toileting Toileting activity did not occur: No continent bowel/bladder event Toileting  steps completed by patient: Adjust clothing prior to toileting, Performs perineal hygiene, Adjust clothing after toileting Toileting steps completed by helper: Adjust clothing after toileting Toileting Assistive Devices: Grab bar or rail  Toileting assist Assist level: Supervision or verbal cues   Transfers Chair/bed transfer   Chair/bed transfer method: Ambulatory Chair/bed transfer assist level: Touching or steadying assistance (Pt > 75%) Chair/bed transfer assistive device: Patent attorney     Max distance: 100 ft Assist level: Supervision or verbal cues   Wheelchair   Type: Manual Max wheelchair distance: 70 Assist Level: Supervision or verbal cues  Cognition Comprehension Comprehension assist level: Understands basic 75 - 89% of the time/ requires cueing 10 - 24% of the time  Expression Expression assist level: Expresses basic 75 - 89% of the time/requires cueing 10 - 24% of the time. Needs helper to occlude trach/needs to repeat words.  Social Interaction Social Interaction assist level: Interacts appropriately 90% of the time - Needs monitoring or encouragement for participation or interaction.  Problem Solving Problem solving assist level: Solves basic 25 - 49% of the time - needs direction more than half the time to initiate, plan or complete simple activities  Memory Memory assist level: Recognizes or recalls 50 - 74% of the time/requires cueing 25 - 49% of the time    Medical Problem List and Plan: 1. 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 2. DVT Prophylaxis/Anticoagulation: SCDs.   3. Pain Management: Hydrocodone as needed 4. Mood: Xanax 0.25 mg daily at bedtime,Lexapro 10 mg daily 5. Neuropsych: This patient is notcapable of making decisions on hisown behalf. 6. Skin/Wound  Care: Removed staples 7. Fluids/Electrolytes/Nutrition: Routine I&Os  -eating quite well  -recheck bmet and cbc's tomorrow 8.Seizure prophylaxis. Keppra 500 mg every 12 hours 9.Atrial fibrillation/AVR/CABG/PPM. Continue low-dose aspirin. Amiodarone 200 mg twice a day, Toprol 12.5 mg twice a day. Follow-up per cardiology services 10.Dysphagia. Dysphagia 2 with thin. Follow-up speech therapy  - sore throat improved 11.Acute diastolic congestive heart failure.     -weights now around 92-93 kg--(new correct weight)    12.Hypokalemia.   K+ 4.4   Continue supplement  13.History of COPD. Check oxygen saturations every shift 14.History of alcohol abuse. Monitor for withdrawal. Provide counseling as appropriate 15.Constipation. Laxative assistance 16. Transaminitis:   Improving  Atorvastatin decreased to 40 on 2/26    17. Leukocytosis  Afebrile, likely steroid induced---weaning steroids to off  WBCs down to 14k on 3/8     Will cont to monitor 18. Macrocytic anemia  Hb 11.0 3/5     Cont Vit supplementation 19. HTN-   reduced Toprol-XL to 12. 5 bid for hypotension Vitals:   01/07/17 0431 01/07/17 0918  BP: 102/85 110/78  Pulse: 72 76  Resp: 16   Temp: 98.6 F (37 C)     improved 20. Urinary retention--voiding incontinently, PVR's low  Urecholine stopped  Urine malodorous   -ucx + for 100k ecoli and 50k klebsiella---  keflex for 7 days  -make sure patient is on commode or eob to empty  - cath PRN 21. LE edema--improved  Lasix--continue at 20mg  daily for now  LOS (Days) 19 A FACE TO FACE EVALUATION WAS PERFORMED  Nasiyah Laverdiere T 01/07/2017 12:49 PM

## 2017-01-07 NOTE — Progress Notes (Signed)
Speech Language Pathology Daily Session Notes  Patient Details  Name: Alex DuhamelJohn R [name] MRN: [id number] Date of Birth: 1931/01/06  Today's Date: 01/07/2017  Session 1: SLP Individual Time: 1030-1100 SLP Individual Time Calculation (min): 30 min   Session 2: SLP Individual Time: 1400-1430 SLP Individual Time Calculation (min): 30 min   Short Term Goals: Week 3: SLP Short Term Goal 1 (Week 3): Pt will answer orientation questions with Min A multimodal cues.  SLP Short Term Goal 2 (Week 3): Pt will demonstrate functional problem solving of basic, famaliar tasks with supervision verbal cues.  SLP Short Term Goal 3 (Week 3): Pt will demonstrate selective attention for ~15 minutes in a mildly disctracting enviornment with Min A verbal cues for redirection to tasks. SLP Short Term Goal 4 (Week 3): Pt will consume trials of regular textures with minimal overt s/s of aspiration and Min A verbal cues for complete oral clearing over 2 session prior to upgrade.  SLP Short Term Goal 5 (Week 3): Patient will consume current diet with minimal overt s/s of aspiration with Mod I.   Skilled Therapeutic Interventions:  Session 1: Skilled treatment session focused on completion of patient and family education with the patient and his wife. SLP facilitated session by providing education in regards to patient's current swallowing function, diet recommendations, medication administration and swallowing compensatory strategies. She verbalized understanding of all information. SLP also facilitated session by providing education in regards to patient's current cognitive function and strategies to utilize at home to maximize recall and overall safety at home. Both verbalized understanding and handouts were given to reinforce information. Patient left upright in wheelchair with all needs within reach. Continue with current plan of care.   Session 2: Skilled treatment session focused on cognitive goals. SLP facilitated  session by providing Mod A verbal and semantic cues for recall with use of memory compensatory strategies during a novel, structured task. Patient demonstrated appropriate emergent awareness in regards to difficulty of task. Patient left upright in wheelchair with quick release belt in place and all needs within reach. Continue with current plan of care.      Function:  Cognition Comprehension Comprehension assist level: Follows basic conversation/direction with extra time/assistive device  Expression   Expression assist level: Expresses basic needs/ideas: With no assist  Social Interaction Social Interaction assist level: Interacts appropriately 90% of the time - Needs monitoring or encouragement for participation or interaction.  Problem Solving Problem solving assist level: Solves basic 75 - 89% of the time/requires cueing 10 - 24% of the time  Memory Memory assist level: Recognizes or recalls 50 - 74% of the time/requires cueing 25 - 49% of the time    Pain No/Denies Pain   Therapy/Group: Individual Therapy  Jermain Curt 01/07/2017, 12:56 PM

## 2017-01-07 NOTE — Progress Notes (Signed)
Physical Therapy Session Note  Patient Details  Name: Alex [name] MRN: [id number] Date of Birth: 1931-04-26  Today's Date: 01/07/2017 PT Individual Time: 1330-1400 PT Individual Time Calculation (min): 30 min   Short Term Goals: Week 2:  PT Short Term Goal 1 (Week 2): Pt will negotiate 12 steps with min assist for BLE strengthening. PT Short Term Goal 1 - Progress (Week 2): Progressing toward goal PT Short Term Goal 2 (Week 2): Pt will demonstrate intellectual awareness with supervision. PT Short Term Goal 2 - Progress (Week 2): Progressing toward goal PT Short Term Goal 3 (Week 2): Pt will demonstrate intellectual awareness with supervision.  Week 3:  PT Short Term Goal 1 (Week 3): STG = LTG due to estimated d/c date.   Skilled Therapeutic Interventions/Progress Updates:   Gait for 137f x 2 and 1060fwith Supervision Assist from PT. Min cues for problem solving to find familiar locations in reahb unit and proper use of signs in hall way to navigate as well as familiar land marks such as the RN station and elevators.   Transfers with overall supervision Assist from PT with one instance of min assist due to LOB to R from missing UE support of arm rest. Instruction from PT for proper safety with sit<>stand and increased use of BUE to prevent accelerated descent to chair.   Nustep reciprocal movement training x 10 minutes with level 4 Resistance. Min cues from PT throughout for proper speed and ROM to improve reciprocal movement training and reduced speed to prevent increased fatigue.   [facility] and all needs met.        Therapy Documentation Precautions:  Precautions Precautions: Fall Precaution Comments:  decreased motor planning Restrictions Weight Bearing Restrictions: No Pain : 0/10   See Function Navigator for Current Functional Status.   Therapy/Group: Individual Therapy  AuLorie Phenix/14/2018, 2:21  PM

## 2017-01-07 NOTE — Progress Notes (Signed)
Physical Therapy Session Note  Patient Details  Name: Jetty DuhamelJohn R Kugler MRN: [id number] Date of Birth: 1931-04-12  Today's Date: 01/07/2017 PT Individual Time: 0900-1000 PT Individual Time Calculation (min): 60 min   Short Term Goals: Week 3:  PT Short Term Goal 1 (Week 3): STG = LTG due to estimated d/c date.   Skilled Therapeutic Interventions/Progress Updates:  Pt received in room with wife ([place]) present for caregiver education. Session focused on pt/family education, ambulation and stair negotiation. Educated pt & wife on pt's L inattention, impaired use of LUE/LLE, fall risk, impaired safety awareness, decreased endurance, car transfer technique, f/u HHPT, and home modifications. Pt & wife voiced understanding of all education. Pt ambulated 100 ft + 100 ft with RW & supervision assistance with therapist providing demonstration and wife & pt return demonstrating proper positioning. Pt negotiated 4 steps + 4 steps with R ascending rail and min assist with therapist and then with pt & wife return demonstrating. Pt without knee buckling or LOB during stair negotiation. Pt with reduced safety awareness that worsens with fatigue but wife provides adequate cuing/instruction. Pt continues to fatigue quickly & require multiple seated rest breaks. At end of session pt left sitting in w/c with QRB donned, all needs within reach, & wife present to supervise.   Therapy Documentation Precautions:  Precautions Precautions: Fall Precaution Comments:  decreased motor planning Restrictions Weight Bearing Restrictions: No  Pain: No c/o noted.   See Function Navigator for Current Functional Status.   Therapy/Group: Individual Therapy  Sandi MariscalVictoria M Scarlet Abad 01/07/2017, 10:19 AM

## 2017-01-07 NOTE — Plan of Care (Signed)
Problem: RH BOWEL ELIMINATION Goal: RH STG MANAGE BOWEL WITH ASSISTANCE STG Manage Bowel with min Assistance.   Outcome: Not Progressing Patient constipated LBM 3-11. Patient reports he has moved his bowels but no report noted in chart. Patient requests no laxatives. Sorbitol to be given tonight.  Problem: RH BLADDER ELIMINATION Goal: RH STG MANAGE BLADDER WITH ASSISTANCE STG Manage Bladder With max Assistance - I&O cath  Outcome: Progressing Patient incontinent at times, reviewed toileting schedule every 2-3 hours with patient and wife. Wife verbalized understanding. PVRs low no in and out caths required.

## 2017-01-07 NOTE — Progress Notes (Signed)
Occupational Therapy Session Note  Patient Details  Name: Alex DuhamelJohn R [name] MRN: [id number] Date of Birth: [birth year]  Today's Date: 01/07/2017 OT Individual Time: [id number] OT Individual Time Calculation (min): 32 min    Short Term Goals: Week 3:  OT Short Term Goal 1 (Week 3): STG=LTG secondary to ELOS  Skilled Therapeutic Interventions/Progress Updates:    Pt completed transfer from the wheelchair to the therapy mat with min assist stand pivot.  After resting briefly on the mat, therapist had pt work in standing on cognitive problem solving task of putting together a 3 dimensional puzzle following diagram.  He was able to stand for 2 interval of 3.5 and 3 mins each while engaged in LUE functional use to pick up and help place pieces.  He did integrate BUE use but was able to involve the LUE for 50% of task.  Pt exhibited decreased FM coordination demonstrating occasional dropping of pieces as well as decreased ability to manipulate the pieces in his hand to orient for correct placement.   Pt was able to correctly place approximately 75% of pieces according to visual diagram.  Finished session with pt returning to room via wheelchair.  Min assist for transfer to the bed and into supine to complete session.  Pt left with call button and phone in reach and bed alarm in place.      Therapy Documentation Precautions:  Precautions Precautions: Fall Precaution Comments:  decreased motor planning Restrictions Weight Bearing Restrictions: No   Pain: Pain Assessment Pain Assessment: No/denies pain  See Function Navigator for Current Functional Status.   Therapy/Group: Individual Therapy  Alex [name] OTR/L 01/07/2017, 4:25 PM

## 2017-01-07 NOTE — Progress Notes (Signed)
Occupational Therapy Weekly Progress Note  Patient Details  Name: Alex [name] MRN: [id number] Date of Birth: 05/16/1931  Beginning of progress report period: December 29, 2016 End of progress report period: January 07, 2017  Patient has met 3 of 3 short term goals.  Pt made steady progress with BADLs during the past week.  Pt continues to fatigue easily and requires multiple rest breaks during bathing/dressing tasks.  Pt requires min verbal cues for task initiation and sequencing.  Pt's wife has been present for therapy sessions and has verbalized understanding of recommendation for 24 hour supervision after discharge.   Patient continues to demonstrate the following deficits: muscle weakness, decreased cardiorespiratoy endurance, impaired timing and sequencing, decreased coordination and decreased motor planning, decreased motor planning, decreased attention, decreased problem solving, decreased safety awareness and delayed processing and decreased standing balance, hemiplegia and decreased balance strategies and therefore will continue to benefit from skilled OT intervention to enhance overall performance with BADL, iADL and Reduce care partner burden.  Patient progressing toward long term goals..  Continue plan of care.  OT Short Term Goals Week 2:  OT Short Term Goal 1 (Week 2): Pt will perform toileting tasks with steady A OT Short Term Goal 1 - Progress (Week 2): Met OT Short Term Goal 2 (Week 2): Pt will perform bathing tasks with min A at shower level OT Short Term Goal 2 - Progress (Week 2): Met OT Short Term Goal 3 (Week 2): Pt will perform LB dressing tasks with mod A OT Short Term Goal 3 - Progress (Week 2): Met OT Short Term Goal 4 (Week 2): Pt will perform grooming tasks standing at sink with steady a Week 3:  OT Short Term Goal 1 (Week 3): STG=LTG secondary to ELOS      Therapy Documentation Precautions:  Precautions Precautions: Fall Precaution Comments:  decreased motor  planning Restrictions Weight Bearing Restrictions: No  See Function Navigator for Current Functional Status.    Leotis Shames [facility] 01/07/2017, 3:21 PM

## 2017-01-07 NOTE — Progress Notes (Signed)
Occupational Therapy Session Note  Patient Details  Name: Alex DuhamelJohn R [name] MRN: [id number] Date of Birth: 05-27-1931  Today's Date: 01/07/2017 OT Individual Time: 1100-1200 OT Individual Time Calculation (min): 60 min    Short Term Goals: Week 2:  OT Short Term Goal 1 (Week 2): Pt will perform toileting tasks with steady A OT Short Term Goal 2 (Week 2): Pt will perform bathing tasks with min A at shower level OT Short Term Goal 3 (Week 2): Pt will perform LB dressing tasks with mod A OT Short Term Goal 4 (Week 2): Pt will perform grooming tasks standing at sink with steady a  Skilled Therapeutic Interventions/Progress Updates:    Pt resting in w/c upon arrival with wife present for education.  Pt engaged in BADL retraining including bathing at shower level and dressing with sit<>stand from chair.  Discussed recommendation for 24 hour supervision after discharge.  Discussed with wife/patient that pt frequently requires verbal cues to initiate tasks and for safety awareness.  Pt's wife verbalized understanding and stated that 24 hour assistance will be hired to provide supervision.  Pt continues to fatigue easily and requires multiple rest breaks during a therapy session.  Pt continues to require min verbal cues for task initiation and sequencing during bathing/dressing tasks.  Pt required min verbal cues for orientation of shirt and pants prior to donning.  Pt remained in w/c with QRB in place and wife present.   Therapy Documentation Precautions:  Precautions Precautions: Fall Precaution Comments:  decreased motor planning Restrictions Weight Bearing Restrictions: No Pain:  Pt denied pain  See Function Navigator for Current Functional Status.   Therapy/Group: Individual Therapy  Rich BraveLanier, Madylyn Insco Chappell 01/07/2017, 12:06 PM

## 2017-01-08 ENCOUNTER — Inpatient Hospital Stay (HOSPITAL_COMMUNITY): Payer: Medicare Other | Admitting: Occupational Therapy

## 2017-01-08 ENCOUNTER — Inpatient Hospital Stay (HOSPITAL_COMMUNITY): Payer: Medicare Other | Admitting: Speech Pathology

## 2017-01-08 ENCOUNTER — Inpatient Hospital Stay (HOSPITAL_COMMUNITY): Payer: Medicare Other

## 2017-01-08 ENCOUNTER — Inpatient Hospital Stay (HOSPITAL_COMMUNITY): Payer: Medicare Other | Admitting: Physical Therapy

## 2017-01-08 LAB — BASIC METABOLIC PANEL
ANION GAP: 7 (ref 5–15)
BUN: 22 mg/dL — AB (ref 6–20)
CO2: 30 mmol/L (ref 22–32)
Calcium: 8.1 mg/dL — ABNORMAL LOW (ref 8.9–10.3)
Chloride: 99 mmol/L — ABNORMAL LOW (ref 101–111)
Creatinine, Ser: 0.82 mg/dL (ref 0.61–1.24)
GFR calc non Af Amer: >60 mL/min (ref >60)
Glucose, Bld: 147 mg/dL — ABNORMAL HIGH (ref 65–99)
POTASSIUM: 4.3 mmol/L (ref 3.5–5.1)
SODIUM: 136 mmol/L (ref 135–145)

## 2017-01-08 LAB — CBC
HCT: 37.4 % — ABNORMAL LOW (ref 39.0–52.0)
HEMOGLOBIN: 12.2 g/dL — AB (ref 13.0–17.0)
MCH: 31.9 pg (ref 26.0–34.0)
MCHC: 32.6 g/dL (ref 30.0–36.0)
MCV: 97.9 fL (ref 78.0–100.0)
PLATELETS: 74 10*3/uL — AB (ref 150–400)
RBC: 3.82 MIL/uL — AB (ref 4.22–5.81)
RDW: 14.3 % (ref 11.5–15.5)
WBC: 8.9 10*3/uL (ref 4.0–10.5)

## 2017-01-08 MED ORDER — DEXAMETHASONE 2 MG PO TABS
1.0000 mg | ORAL_TABLET | Freq: Two times a day (BID) | ORAL | Status: DC
  Administered 2017-01-08 – 2017-01-09 (×2): 1 mg via ORAL
  Filled 2017-01-08 (×3): qty 1

## 2017-01-08 NOTE — Progress Notes (Signed)
[place] PHYSICAL MEDICINE & REHABILITATION     PROGRESS NOTE  Subjective/Complaints:  No new issues. Pleased with progress. Feels well  ROS: pt denies nausea, vomiting, diarrhea, cough, shortness of breath or chest pain      Objective: Vital Signs: Blood pressure 100/69, pulse 60, temperature 98.3 F (36.8 C), temperature source Oral, resp. rate 18, weight 91.6 kg (202 lb), SpO2 96 %. No results found.  Recent Labs  01/08/17 0524  WBC 8.9  HGB 12.2*  HCT 37.4*  PLT 74*    Recent Labs  01/08/17 0524  NA 136  K 4.3  CL 99*  GLUCOSE 147*  BUN 22*  CREATININE 0.82  CALCIUM 8.1*   CBG (last 3)  No results for input(s): GLUCAP in the last 72 hours.  Wt Readings from Last 3 Encounters:  01/08/17 91.6 kg (202 lb)  12/16/16 103.2 kg (227 lb 8.2 oz)  12/15/16 105.2 kg (232 lb)    Physical Exam:  BP 100/69 (BP Location: Right Arm)   Pulse 60   Temp 98.3 F (36.8 C) (Oral)   Resp 18   Wt 91.6 kg (202 lb)   SpO2 96%   BMI 25.94 kg/m  Constitutional: NAD. Well-developed. Vital signs reviewed.  Mouth-0ral mucosa pink/moist HENT: scalp incision dry,healing Eyes: EOMI. No discharge.  Cardiovascular: IRR IRR Respiratory: cta bilaterally GI: Soft. Bowel sounds are normal.  Musc: No tenderness. No edema Neurological.Alert and oriented to person, place, month, reason   Motor: B/l UE 4+/5 prox to distal.  B/l LE: 4+/5 proximally to distal Skin: Warm and dry.   Psych: pleasant and cooperative.    Assessment/Plan: 1. Functional deficits secondary to SDH sp craniotomy which require 3+ hours per day of interdisciplinary therapy in a comprehensive inpatient rehab setting. Physiatrist is providing close team supervision and 24 hour management of active medical problems listed below. Physiatrist and rehab team continue to assess barriers to discharge/monitor patient progress toward functional and medical goals.  Function:  Bathing Bathing position Bathing  activity did not occur: Refused Position: Systems developer parts bathed by patient: Right arm, Left arm, Chest, Abdomen, Front perineal area, Buttocks, Right upper leg, Left upper leg, Right lower leg, Left lower leg Body parts bathed by helper: Back  Bathing assist Assist Level: Supervision or verbal cues      Upper Body Dressing/Undressing Upper body dressing Upper body dressing/undressing activity did not occur: Refused What is the patient wearing?: Pull over shirt/dress     Pull over shirt/dress - Perfomed by patient: Thread/unthread right sleeve, Pull shirt over trunk, Put head through opening, Thread/unthread left sleeve Pull over shirt/dress - Perfomed by helper: Thread/unthread left sleeve        Upper body assist Assist Level: Supervision or verbal cues      Lower Body Dressing/Undressing Lower body dressing   What is the patient wearing?: Underwear, Pants, Non-skid slipper socks, Ted Hose Underwear - Performed by patient: Thread/unthread right underwear leg, Thread/unthread left underwear leg, Pull underwear up/down Underwear - Performed by helper: Thread/unthread left underwear leg Pants- Performed by patient: Thread/unthread right pants leg, Thread/unthread left pants leg, Pull pants up/down Pants- Performed by helper: Thread/unthread left pants leg Non-skid slipper socks- Performed by patient: Don/doff right sock, Don/doff left sock Non-skid slipper socks- Performed by helper: Don/doff right sock, Don/doff left sock   Socks - Performed by helper: Don/doff right sock Shoes - Performed by patient: Don/doff right shoe, Don/doff left shoe Shoes - Performed by helper: Fasten  right, Fasten left       TED Hose - Performed by helper: Don/doff right TED hose, Don/doff left TED hose  Lower body assist Assist for lower body dressing: Supervision or verbal cues      Toileting Toileting Toileting activity did not occur: No continent bowel/bladder event Toileting  steps completed by patient: Adjust clothing prior to toileting, Performs perineal hygiene, Adjust clothing after toileting Toileting steps completed by helper: Adjust clothing after toileting Toileting Assistive Devices: Grab bar or rail  Toileting assist Assist level: Supervision or verbal cues   Transfers Chair/bed transfer   Chair/bed transfer method: Stand pivot Chair/bed transfer assist level: Supervision or verbal cues Chair/bed transfer assistive device: Armrests, Patent attorneyWalker     Locomotion Ambulation     Max distance: 150 Assist level: Supervision or verbal cues   Wheelchair   Type: Manual Max wheelchair distance: 70 Assist Level: Supervision or verbal cues  Cognition Comprehension Comprehension assist level: Follows basic conversation/direction with extra time/assistive device  Expression Expression assist level: Expresses basic needs/ideas: With no assist  Social Interaction Social Interaction assist level: Interacts appropriately 90% of the time - Needs monitoring or encouragement for participation or interaction.  Problem Solving Problem solving assist level: Solves basic 75 - 89% of the time/requires cueing 10 - 24% of the time  Memory Memory assist level: Recognizes or recalls 50 - 74% of the time/requires cueing 25 - 49% of the time    Medical Problem List and Plan: 1. 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 DVT Prophylaxis/Anticoagulation: SCDs.   3. Pain Management: Hydrocodone as needed 4. Mood: Xanax 0.25 mg daily at bedtime,Lexapro 10 mg daily 5. Neuropsych: This patient is notcapable of making decisions on hisown behalf. 6. Skin/Wound Care: Removed staples 7. Fluids/Electrolytes/Nutrition: Routine I&Os  -eating quite well  -BUN a little elevated---encourage adequate fluids  -continue same  lasix 8.Seizure prophylaxis. Keppra 500 mg every 12 hours 9.Atrial fibrillation/AVR/CABG/PPM. Continue low-dose aspirin. Amiodarone 200 mg twice a day, Toprol 12.5 mg twice a day. Follow-up per cardiology services 10.Dysphagia. Dysphagia 2 with thin. Follow-up speech therapy  - sore throat improved 11.Acute diastolic congestive heart failure.     -weights now around 92-93 kg- 91 today    12.Hypokalemia.   K+ 4.3 today   Continue supplement  13.History of COPD. Check oxygen saturations every shift 14.History of alcohol abuse. Monitor for withdrawal. Provide counseling as appropriate 15.Constipation. Laxative assistance 16. Transaminitis:   Improved    17. Leukocytosis  Resolved--8.9 today    Will cont to monitor 18. Macrocytic anemia  Hb 11.0 3/5     Cont Vit supplementation 19. HTN-   reduced Toprol-XL to 12. 5 bid for hypotension Vitals:   01/07/17 1558 01/08/17 0540  BP: (!) 114/58 100/69  Pulse: 66 60  Resp: 17 18  Temp: 98.6 F (37 C) 98.3 F (36.8 C)    improved 20. Urinary retention--voiding incontinently, PVR's low  Urecholine stopped  UCX + for 100k ecoli and 50k klebsiella---  keflex for 7 days (thru Sunday)  -make sure patient is on commode or eob to empty 21. LE edema--improved  Lasix--continue at 20mg  daily for now  LOS (Days) 20 A FACE TO FACE EVALUATION WAS PERFORMED  Quirino Kakos T 01/08/2017 9:13 AM

## 2017-01-08 NOTE — Progress Notes (Signed)
Occupational Therapy Session Note  Patient Details  Name: Alex [name] MRN: [id number] Date of Birth: 09-06-1931  Today's Date: 01/08/2017 OT Individual Time: 0900-1030 OT Individual Time Calculation (min): 90 min    Short Term Goals: Week 2:  OT Short Term Goal 1 (Week 2): Pt will perform toileting tasks with steady A OT Short Term Goal 1 - Progress (Week 2): Met OT Short Term Goal 2 (Week 2): Pt will perform bathing tasks with min A at shower level OT Short Term Goal 2 - Progress (Week 2): Met OT Short Term Goal 3 (Week 2): Pt will perform LB dressing tasks with mod A OT Short Term Goal 3 - Progress (Week 2): Met OT Short Term Goal 4 (Week 2): Pt will perform grooming tasks standing at sink with steady a Week 3:  OT Short Term Goal 1 (Week 3): STG=LTG secondary to ELOS  Skilled Therapeutic Interventions/Progress Updates:    Pt initially engaged in BADL retraining including bathing at shower level and dressing with sit<>stand from w/c.  Focus on activity tolerance, standing balance, sit<>stand, increased LUE use for functional tasks, task initiation, and sequencing.  Pt initially attempted to turn off water in shower before using wash cloth and body wash.  Pt completed all bathing tasks with more than a reasonable amount of time and rest breaks during task.  Pt continues to initiate use of LUE with all tasks but exhibits decreased coordination.  Pt completed grooming tasks seated at sink.  Pt's LUE deficits more pronounced manipulating items at sink.  Pt transitioned to theraputty task with LUE/hand-removing small beads from putty.  Continued discharge planning with wife and reinforced safety recommendations.  Pt remained in w/c with QRB in place and chair alarm activated.   Therapy Documentation Precautions:  Precautions Precautions: Fall Precaution Comments:  decreased motor planning Restrictions Weight Bearing Restrictions: No Pain: Pain Assessment Pain Assessment: No/denies  pain  See Function Navigator for Current Functional Status.   Therapy/Group: Individual Therapy  Leroy Libman 01/08/2017, 10:42 AM

## 2017-01-08 NOTE — Discharge Instructions (Signed)
Inpatient Rehab Discharge Instructions  Alex DuhamelJohn R [name] Discharge date and time: No discharge date for patient encounter.   Activities/Precautions/ Functional Status: Activity: activity as tolerated Diet: Mechanical soft Wound Care: keep wound clean and dry Functional status:  ___ No restrictions     ___ Walk up steps independently ___ 24/7 supervision/assistance   ___ Walk up steps with assistance ___ Intermittent supervision/assistance  ___ Bathe/dress independently ___ Walk with walker     _x__ Bathe/dress with assistance ___ Walk Independently    ___ Shower independently ___ Walk with assistance    ___ Shower with assistance ___ No alcohol     ___ Return to work/school ________    COMMUNITY REFERRALS UPON DISCHARGE:    Home Health:   PT     OT    ST    RN                     Agency: [facility]      Phone: [phone]   Medical Equipment/Items Ordered: wheelchair, cushion                                                     Agency/Supplier: [facility] [phone]       Special Instructions: No Coumadin at this time due to subdural hematoma   My questions have been answered and I understand these instructions. I will adhere to these goals and the provided educational materials after my discharge from the hospital.  Patient/Caregiver Signature _______________________________ Date __________  Clinician Signature _______________________________________ Date __________  Please bring this form and your medication list with you to all your follow-up doctor's appointments.

## 2017-01-08 NOTE — Progress Notes (Signed)
Speech Language Pathology Daily Session Note  Patient Details  Name: Alex DuhamelJohn R [name] MRN: [id number] Date of Birth: [birth year]  Today's Date: 01/08/2017 SLP Individual Time: 1345-1430 SLP Individual Time Calculation (min): 45 min  Short Term Goals: Week 3: SLP Short Term Goal 1 (Week 3): Pt will answer orientation questions with Min A multimodal cues.  SLP Short Term Goal 2 (Week 3): Pt will demonstrate functional problem solving of basic, famaliar tasks with supervision verbal cues.  SLP Short Term Goal 3 (Week 3): Pt will demonstrate selective attention for ~15 minutes in a mildly disctracting enviornment with Min A verbal cues for redirection to tasks. SLP Short Term Goal 4 (Week 3): Pt will consume trials of regular textures with minimal overt s/s of aspiration and Min A verbal cues for complete oral clearing over 2 session prior to upgrade.  SLP Short Term Goal 5 (Week 3): Patient will consume current diet with minimal overt s/s of aspiration with Mod I.   Skilled Therapeutic Interventions: Skilled treatment session focused on cognitive goals. SLP facilitated session by providing Min A verbal cues for recall of procedures and supervision verbal cues for problem solving during a mildly complex, novel task. Patient demonstrated selective attention to task in a mildly distracting environment for 30 minutes with Mod I. Patient left upright in wheelchair with quick release belt in place and all needs within reach. Continue with current plan of care.      Function:   Cognition Comprehension Comprehension assist level: Follows basic conversation/direction with extra time/assistive device  Expression   Expression assist level: Expresses basic needs/ideas: With no assist  Social Interaction Social Interaction assist level: Interacts appropriately 90% of the time - Needs monitoring or encouragement for participation or interaction.  Problem Solving Problem solving assist level: Solves basic 75 -  89% of the time/requires cueing 10 - 24% of the time  Memory Memory assist level: Recognizes or recalls 50 - 74% of the time/requires cueing 25 - 49% of the time    Pain No/Denies Pain   Therapy/Group: Individual Therapy  Cintia Gleed 01/08/2017, 4:25 PM

## 2017-01-08 NOTE — Progress Notes (Signed)
Occupational Therapy Session Note  Patient Details  Name: Jetty DuhamelJohn R Eshbach MRN: [id number] Date of Birth: 10-Feb-1931  Today's Date: 01/08/2017 OT Individual Time: [id number] OT Individual Time Calculation (min): 43 min    Short Term Goals: Week 3:  OT Short Term Goal 1 (Week 3): STG=LTG secondary to ELOS  Skilled Therapeutic Interventions/Progress Updates:    Upon entering the room, pt on toilet with NT present in the room. Pt attempting to have BM this session. Pt removing clothing while seated on toilet secondary to them being soiled. Pt standing from toilet and performing hygiene with steady assistance. Pt ambulated with hand held assistance to sit on TTB and don LB clothing with steady assistance. Pt needing multiple rest breaks secondary to fatigue. Pt ambulating with hand held assist to sink to wash hands while standing with supervision. Pt taking seated rest break on the EOB. OT asked pt several mathematical questions related to current time and next therapy sessions which pt was able to answer with mod verbal questioning cues and increased time. Pt correctly states date by counting backwards from upcoming discharge date. Shoe place in hip lap and he was able to perform first step and tying shoe laces. He made multiple attempting and with increased time he was able to make one loop and tie shoe laces from lap. Pt turned to bed to rest at the end of session with bed alarm activated and call bell within reach.  Therapy Documentation Precautions:  Precautions Precautions: Fall Precaution Comments:  decreased motor planning Restrictions Weight Bearing Restrictions: No General:   Vital Signs: Therapy Vitals Temp: 97.9 F (36.6 C) Temp Source: Oral Pulse Rate: 77 Resp: 17 BP: 96/68 Patient Position (if appropriate): Sitting Oxygen Therapy SpO2: 99 % O2 Device: Not Delivered  See Function Navigator for Current Functional Status.   Therapy/Group: Individual Therapy  Alen BleacherBradsher,  Dequante Tremaine P 01/08/2017, 4:54 PM

## 2017-01-08 NOTE — Progress Notes (Signed)
Physical Therapy Note  Patient Details  Name: Alex [name] MRN: [id number] Date of Birth: 28-Aug-1931 Today's Date: 01/08/2017  0800-0900, 60 min individual tx Pain: no  c/o  Bed mobility in flat bed, no rails. Pt pulled up pants in standing without LOB. Pt ate part of breakfast sitting EOB, observing swallowing precuations without cues..Simulated car trasnfer with mod cues.  Gait up/down ramp and on level terrain with supervision. Gait on mulch with min assist. Up/down 4 6" steps with R rail, mod cues, min assist due to poor knee ext bil with q step. Pt left resting in w/c with breakfast set up to finish eating,  Quick release belt donned, and all needs within reach.  See function navigator  Mariafernanda Hendricksen 01/08/2017, 9:02 AM

## 2017-01-08 NOTE — Progress Notes (Signed)
Social Work Patient ID: Alex DuhamelJohn R Fitzgibbon, male   DOB: 05-11-1931, 83 y.o.   MRN: [id number]   Have spoken with pt and wife over the past few days to review team conference and coordinate DME and HH follow up.  Both are feeling ready for d/c on Saturday and wife has arranged private duty care in the home as well.  Pleased with progress.  Will continue to follow.  Faizan Geraci, LCSW

## 2017-01-09 ENCOUNTER — Inpatient Hospital Stay (HOSPITAL_COMMUNITY): Payer: Medicare Other

## 2017-01-09 ENCOUNTER — Inpatient Hospital Stay (HOSPITAL_COMMUNITY): Payer: Medicare Other | Admitting: Speech Pathology

## 2017-01-09 ENCOUNTER — Inpatient Hospital Stay (HOSPITAL_COMMUNITY): Payer: Medicare Other | Admitting: Occupational Therapy

## 2017-01-09 MED ORDER — ESCITALOPRAM OXALATE 10 MG PO TABS: 10.0000 mg | ORAL_TABLET | Freq: Every day | ORAL | 1 refills | Status: DC

## 2017-01-09 MED ORDER — DEXAMETHASONE 2 MG PO TABS
1.0000 mg | ORAL_TABLET | Freq: Every day | ORAL | Status: DC
  Administered 2017-01-10: 1 mg via ORAL
  Filled 2017-01-09: qty 1

## 2017-01-09 MED ORDER — LEVETIRACETAM 500 MG PO TABS: 500.0000 mg | ORAL_TABLET | Freq: Two times a day (BID) | ORAL | 0 refills | Status: DC

## 2017-01-09 MED ORDER — HYDROCODONE-ACETAMINOPHEN 5-325 MG PO TABS: 1.0000 | ORAL_TABLET | Freq: Two times a day (BID) | ORAL | 0 refills | Status: DC | PRN

## 2017-01-09 MED ORDER — METOPROLOL SUCCINATE ER 25 MG PO TB24: 12.5000 mg | ORAL_TABLET | Freq: Every day | ORAL | 1 refills | Status: DC

## 2017-01-09 MED ORDER — POTASSIUM CHLORIDE CRYS ER 20 MEQ PO TBCR: 20.0000 meq | EXTENDED_RELEASE_TABLET | Freq: Every day | ORAL | 1 refills | Status: DC

## 2017-01-09 MED ORDER — FUROSEMIDE 20 MG PO TABS: ORAL_TABLET | ORAL | 6 refills | Status: DC

## 2017-01-09 MED ORDER — ATORVASTATIN CALCIUM 40 MG PO TABS: 40.0000 mg | ORAL_TABLET | Freq: Every day | ORAL | 1 refills | Status: DC

## 2017-01-09 MED ORDER — ALPRAZOLAM 0.25 MG PO TABS: 0.2500 mg | ORAL_TABLET | Freq: Every day | ORAL | 0 refills | Status: DC

## 2017-01-09 MED ORDER — CEPHALEXIN 250 MG PO CAPS: 250.0000 mg | ORAL_CAPSULE | Freq: Three times a day (TID) | ORAL | 0 refills | Status: DC

## 2017-01-09 MED ORDER — AMIODARONE HCL 200 MG PO TABS: 200.0000 mg | ORAL_TABLET | Freq: Two times a day (BID) | ORAL | 1 refills | Status: DC

## 2017-01-09 NOTE — Progress Notes (Signed)
Physical Therapy Discharge Summary  Patient Details  Name: Alex [name] MRN: [id number] Date of Birth: Dec 15, 1930  Individual Time: 1026-1126 (60 min) Denies pain. Session focused on addressing grad day activities to prepare for discharge tomorrow. Adjusted delivered w/c for proper leg rest fit. Pt participated in functional transfers with RW in various settings, simulated car transfer using RW, gait up/down ramp, over mulched surface, and through obstacle course to simulate community mobility (required min assist at times due to LOB), stair negotiation for home entry with R rail, bed mobility in ADL apartment to simulate home environment set-up, and Nustep for reciprocal movement pattern retraining and coordination x 10 min on level 4. Pt at overall supervision with mobility using RW.   Patient has met 8 of 10 long term goals due to improved activity tolerance, improved balance, improved postural control, decreased pain, ability to compensate for deficits, functional use of  left upper extremity and left lower extremity, improved attention, improved awareness and improved coordination.  Patient to discharge at an ambulatory level Supervision using RW.   Patient's wife is independent to provide the necessary physical and cognitive assistance at discharge and successfully completed family education.   Reasons goals not met: Pt did not meet stair goal, anticipatory awareness goal, and community gait goal. Pt requires min assist for stairs with R rail and up to min assist in community setting. Pt requires up to max assist for anticipatory awareness.  Recommendation:  Patient will benefit from ongoing skilled PT services in home health setting to continue to advance safe functional mobility, address ongoing impairments in balance, postural control, endurance, gait, activity tolerance/functional endurance, cognition, and minimize fall risk.  Equipment: w/c with basic cushion and RW  Reasons for  discharge: treatment goals met and discharge from hospital  Patient/family agrees with progress made and goals achieved: Yes  PT Discharge Precautions/Restrictions Precautions Precautions: Fall Restrictions Weight Bearing Restrictions: No    Cognition Overall Cognitive Status: Impaired/Different from baseline Arousal/Alertness: Awake/alert Orientation Level: Oriented to person;Oriented to place;Oriented to time;Oriented to situation Selective Attention: Appears intact Memory: Impaired Problem Solving: Impaired [place]o [employer] Scales of Cognitive Functioning: Purposeful/appropriate Sensation Sensation Light Touch: Impaired Detail Light Touch Impaired Details: Impaired LLE Motor  Motor Motor: Abnormal postural alignment and control;Hemiplegia   Trunk/Postural Assessment  Cervical Assessment Cervical Assessment:  (forward head) Thoracic Assessment Thoracic Assessment:  (flexed posture) Lumbar Assessment Lumbar Assessment:  (posterior tilt) Postural Control Postural Control: Deficits on evaluation Protective Responses: delayed  Balance Static Sitting Balance Static Sitting - Level of Assistance: 6: Modified independent (Device/Increase time) Dynamic Sitting Balance Dynamic Sitting - Level of Assistance: 6: Modified independent (Device/Increase time) Static Standing Balance Static Standing - Level of Assistance: 5: Stand by assistance (with RW) Dynamic Standing Balance Dynamic Standing - Level of Assistance: 5: Stand by assistance (with RW) Extremity Assessment      RLE Assessment RLE Assessment:  (grossly 4/5; decreased muscular endurance) LLE Assessment LLE Assessment: Exceptions to [facility] LLE Strength LLE Overall Strength Comments: grossly 4-/5; decreased muscular endurance   See Function Navigator for Current Functional Status.  Canary Brim Ivory Broad, PT, DPT  01/09/2017, 12:08 PM

## 2017-01-09 NOTE — Progress Notes (Signed)
Social Work  Discharge Note  The overall goal for the admission was met for:   Discharge location: Yes - home with wife who has arranged private duty caregivers to assist her in providing the 24/7 care  Length of Stay: Yes - 23 days  Discharge activity level: Yes - supervision overall  Home/community participation: Yes  Services provided included: MD, RD, PT, OT, SLP, RN, TR, Pharmacy and [place]: [facility] Medicare  Follow-up services arranged: Home Health: RN, PT, OT, ST via [facility], DME: 20x18 lightweight w/c with cushion via [place] and Patient/Family has no preference for HH/DME agencies  Comments (or additional information):  Patient/Family verbalized understanding of follow-up arrangements: Yes  Individual responsible for coordination of the follow-up plan: pt/ wife  Confirmed correct DME delivered: Lennart Pall 01/09/2017    Eldrige Pitkin

## 2017-01-09 NOTE — Plan of Care (Signed)
Problem: RH Dressing Goal: LTG Patient will perform lower body dressing w/assist (OT) LTG: Patient will perform lower body dressing with assist, with/without cues in positioning using equipment (OT)  Outcome: Not Met (add Reason) Pt require MIN A to fasten footwear, otherwise pt supervision level for LB dressing ~SMS

## 2017-01-09 NOTE — Discharge Summary (Signed)
Alex [name]:  Alex [name], Alex [name]                 ACCOUNT NO.:  [id number]  MEDICAL RECORD NO.:  [id number]  LOCATION:  [id number]                        FACILITY:  [facility]  PHYSICIAN:  Alex [name], M.D.DATE OF BIRTH:  18-Jul-1931  DATE OF ADMISSION:  12/19/2016 DATE OF DISCHARGE:  01/10/2017                              DISCHARGE SUMMARY   DISCHARGE DIAGNOSES: 1. Traumatic subdural hematoma, status post right frontotemporal     parietal craniotomy. 2. Sequential compression devices for deep vein thrombosis     prophylaxis, pain management, and anxiety. 3. Seizure prophylaxis. 4. Atrial fibrillation with aortic valve replacement and pacemaker. 5. Dysphagia. 6. Acute diastolic congestive heart failure. 7. Hypokalemia. 8. Chronic obstructive pulmonary disease. 9. History of alcohol abuse, constipation, macrocytic anemia,     hypertension, urinary retention.  HISTORY OF PRESENT ILLNESS:  This is an 83 year old right-handed male with history of morbid obesity, alcohol use, COPD, atrial fibrillation with AVR, pacemaker, had been maintained on aspirin and Coumadin.  He lives with his wife, independent prior to admission.  Presented 12/03/2016 with altered mental status after a recent fall in the yard, struck his head without loss of consciousness.  Developed dry heaving, gradual onset of moderate squeezing chest pain, and shortness of breath. CT of the head showed a left parietal scalp hematoma, acute right hemispheric subdural hematoma with maximal thickness in the right posterior parietal region, right-to-left midline shift.  Troponin negative.  INR on admission of 4.14 and was reversed.  Neurosurgery, Dr. Marikay Alaravid [name] advised conservative care.  Hospital course noted confusion, delirium.  He had been started on Precedex.  Reported grand mal seizure 12/04/2016, required intubation, maintained on Keppra.  EEG showed occasional low amplitude epileptiform discharges over the right posterior temporal  region.  Remained off anticoagulation due to subdural hematoma.  A nasogastric tube was placed for nutritional support and diet slowly advanced.  Latest cranial CT scan showed stable right subdural hematoma with stable mass effect.  No new large territory infarct or parenchymal hemorrhage identified.  Physical and occupational therapy ongoing.  He was admitted for a comprehensive rehab program 12/10/2016 with slow progressive gains.  Cardiology Services had been consulted 12/12/2016 for increasing shortness of breath, tachycardia, and wheezing.  Chest x-ray consistent with pulmonary edema.  He did receive intravenous Lasix.  His amiodarone was adjusted as well as Lopressor titrated.  On 12/15/2016, the patient became more lethargic. CT of the head showed interval increase in size of large broad-based right-sided subdural hematoma, increase mass effect.  Neurosurgery consulted.  He was discharged to acute care services.  Underwent right frontotemporal parietal craniotomy for evacuation of subdural hematoma per Dr. Bevely [name].  Maintained on Keppra for seizure prophylaxis.  Decadron protocol.  His low-dose aspirin was resumed for history of atrial fibrillation, but no Coumadin at this time due to subdural hematoma. The patient was again admitted for a comprehensive rehab program.  PAST MEDICAL HISTORY:  See discharge diagnoses.  SOCIAL HISTORY:  Lives with wife, independent prior to admission.  Functional status upon admission to rehab services was max assist to ambulate with a rolling walker, moderate assist sit to stand, max total assist with activities of daily living.  PHYSICAL  EXAMINATION:  VITAL SIGNS:  Blood pressure 121/62, pulse 62, temperature 97, respirations 18. GENERAL:  This was an alert male, made good eye contact with examiner. He knew he was at the hospital, recalled the month was January.  There was some confabulation. HEENT:  EOMs intact.  Craniotomy site clean and  dry. CARDIAC:  Irregularly irregular rate. ABDOMEN:  Soft, nontender.  Good bowel sounds. RESPIRATORY:  Lungs clear to auscultation.  No wheeze.  REHABILITATION HOSPITAL COURSE:  The patient was admitted to inpatient rehab services with therapies initiated on a 3-hour daily basis consisting of physical therapy, occupational therapy, speech therapy, and rehabilitation nursing.  The following issues were addressed during the patient's rehabilitation stay.  Pertaining to [name] traumatic subdural hematoma, he had undergone craniotomy.  He would follow up with Neurosurgery.  Surgical site healing nicely.  He was attending full therapies.  SCDs for DVT prophylaxis.  He was using hydrocodone on a limited basis for pain.  He was sleeping better at night.  Noted some anxiety.  He was using Xanax at bedtime as well as maintained on Lexapro.  He would continue on Keppra for seizure prophylaxis.  Cardiac rate controlled.  Followup per Cardiology Services.  His low-dose aspirin had been resumed.  He continued on amiodarone as advised as well as low-dose Toprol.  His diet had been advanced to a regular consistency.  Bouts of hypokalemia resolved with potassium supplement. Bouts of constipation resolved with laxative assistance.  He had some urinary retention.  He was voiding much better.  He was initially on Urecholine as well as treated for an E. coli urinary tract infection, remaining afebrile, completing a course of Keflex.  The patient received weekly collaborative interdisciplinary team conferences to discuss estimated length of stay, family teaching, any barriers to discharge. The patient went through simulated car transfers with moderate cues up and down a ramp on a level terrain with supervision, ambulated on an uneven surface with minimal assist, up and down steps, moderate cues, minimal assistance.  He could gather belongings for activities of daily living and homemaking, focusing on  activity tolerance, standing balance, and task initiation.  He was able to communicate his needs.  Sessions focused on memory compensatory strategies.  Full family teaching was completed and plan discharge to home.  DISCHARGE MEDICATIONS:  Included: 1. Xanax 0.25 mg p.o. at bedtime. 2. Amiodarone 200 mg p.o. b.i.d. 3. Aspirin 81 mg p.o. daily. 4. Lipitor 40 mg p.o. daily. 5. Vitamin C 1 tablet daily. 6. Keflex 250 mg p.o. every 8 hours x2 more days. 7. Decadron 1 mg p.o. q.12 hours and taper to off. 8. Lexapro 10 mg p.o. at bedtime. 9. Lasix 20 mg p.o. daily. 10.Keppra 500 mg p.o. b.i.d. 11.Magnesium oxide 200 mg p.o. daily. 12.Toprol-XL 12.5 mg p.o. daily. 13.Potassium chloride 20 mEq p.o. daily. 14.Hydrocodone 1 tablet twice daily as needed.  DIET:  His diet was regular.  FOLLOWUP:  The patient would follow up with Dr. Faith Rogue at the outpatient rehab service office as advised; Dr. Sharlet Salina Ditty, Neurosurgery, call for appointment; Dr. Tonny Bollman, Cardiology Services, call for appointment; Dr. Daneil Dolin, medical management.  SPECIAL INSTRUCTIONS:  No Coumadin therapy due to traumatic subdural hematoma.     Mariam Dollar, P.A.   ______________________________ Alex Oyster, M.D.    DA/MEDQ  D:  01/09/2017  T:  01/09/2017  Job:  [id number]  cc:   Cherrie Distance, MD Alex Oyster, M.D. Veverly Fells. Excell Seltzer, MD Daneil Dolin,  MD

## 2017-01-09 NOTE — Plan of Care (Signed)
Problem: RH Ambulation Goal: LTG Patient will ambulate in community environment (PT) LTG: Patient will ambulate in community environment, # of feet with assistance (PT).  Outcome: Adequate for Discharge Min assist  Problem: RH Stairs Goal: LTG Patient will ambulate up and down stairs w/assist (PT) LTG: Patient will ambulate up and down # of stairs with assistance (PT)  Outcome: Adequate for Discharge Requires min assist assist for 1 rail

## 2017-01-09 NOTE — Progress Notes (Signed)
Speech Language Pathology Session Note & Discharge Summary  Patient Details  Name: Alex [name] MRN: [id number] Date of Birth: 1931/04/28  Today's Date: 01/09/2017 SLP Individual Time: 0930-1030 SLP Individual Time Calculation (min): 60 min   Skilled Therapeutic Interventions:  Skilled treatment session focused on cognitive goals. SLP facilitated session by re-administering the MoCA. Patient scored 19/22 points on the [facility] with a score of 18 or above considered normal. Patient continues to demonstrate deficits in the areas of short-term recall. SLP also facilitated session by providing Min A verbal cues for recall of procedures to a previously learned but novel task. Patient completed tasks with Mod I. Patient left upright in wheelchair with wife present. Continue with current plan of care.   Patient has met 9 of 10 long term goals.  Patient to discharge at [facility] level.   Reasons goals not met: Patient requires Mod A for recall of new information.     Clinical Impression/Discharge Summary:   Patient has made excellent gains and has met 9 of 10 LTG's this admission. Currently, patient is consuming regular textures with thin liquids without overt s/s of aspiration and is Mod I for use of swallowing compensatory strategies. Patient is also demonstrating behaviors consistent with a [place]o Level VIII and requires overall Min A to complete functional and familiar tasks safely in regards to problem solving, attention and awareness. Patient continues to require overall Mod A verbal cues for recall of new information. Patient and family education is complete and patient will discharge home with 24 hour supervision. Patient would benefit from f/u SLP services to maximize his cognitive function and overall functional independence in order to reduce caregiver burden.   Care Partner:  Caregiver Able to Provide Assistance: Yes  Type of Caregiver Assistance: Physical;Cognitive  Recommendation:   Home Health SLP;24 hour supervision/assistance  Rationale for SLP Follow Up: Maximize cognitive function and independence;Reduce caregiver burden   Equipment: N/A   Reasons for discharge: Treatment goals met;Discharged from hospital   Patient/Family Agrees with Progress Made and Goals Achieved: Yes   Function:  Cognition Comprehension Comprehension assist level: Follows basic conversation/direction with no assist  Expression   Expression assist level: Expresses basic needs/ideas: With no assist  Social Interaction Social Interaction assist level: Interacts appropriately 90% of the time - Needs monitoring or encouragement for participation or interaction.  Problem Solving Problem solving assist level: Solves basic 75 - 89% of the time/requires cueing 10 - 24% of the time  Memory Memory assist level: Recognizes or recalls 50 - 74% of the time/requires cueing 25 - 49% of the time   Hiroki Wint 01/09/2017, 12:07 PM

## 2017-01-09 NOTE — Progress Notes (Signed)
Occupational Therapy Session Note  Patient Details  Name: Alex DuhamelJohn R [name] MRN: [id number] Date of Birth: 03/29/1931  Today's Date: 01/09/2017 OT Individual Time: 1259-1345 OT Individual Time Calculation (min): 46 min    Skilled Therapeutic Interventions/Progress Updates: Pt was sitting in w/c at time of arrival, eating lunch. Min cues for alternating liquids/solids provided. Pt initiated one throat clear. Afterwards he ambulated to dayroom with RW and supervision. He engaged in modified leisure activity (golf), focusing on dynamic balance without AD, L UE NMR, and standing endurance. Pt required Min A for balance during task. Afterwards he ambulated back to room in manner as written above. He was left in w/c with chair alarm activated and all needs within reach.      Therapy Documentation Precautions:  Precautions Precautions: Fall Precaution Comments:  decreased motor planning Restrictions Weight Bearing Restrictions: No General:   Vital Signs: Therapy Vitals Temp: 97.5 F (36.4 C) Temp Source: Oral Pulse Rate: 60 Resp: 18 BP: (!) 101/59 Patient Position (if appropriate): Sitting Oxygen Therapy SpO2: 100 % O2 Device: Not Delivered Pain: No c/o pain during session    ADL:  :    See Function Navigator for Current Functional Status.   Therapy/Group: Individual Therapy  Alisabeth Selkirk A Doyce Saling 01/09/2017, 4:02 PM

## 2017-01-09 NOTE — Progress Notes (Signed)
[facility]     PROGRESS NOTE  Subjective/Complaints:  Had a good night. Excited to be going home. Denies pain  ROS: pt denies nausea, vomiting, diarrhea, cough, shortness of breath or chest pain     Objective: Vital Signs: Blood pressure 105/66, pulse 78, temperature 98.1 F (36.7 C), temperature source Oral, resp. rate 18, weight 91.7 kg (202 lb 2.6 oz), SpO2 99 %. No results found.  Recent Labs  01/08/17 0524  WBC 8.9  HGB 12.2*  HCT 37.4*  PLT 74*    Recent Labs  01/08/17 0524  NA 136  K 4.3  CL 99*  GLUCOSE 147*  BUN 22*  CREATININE 0.82  CALCIUM 8.1*   CBG (last 3)  No results for input(s): GLUCAP in the last 72 hours.  Wt Readings from Last 3 Encounters:  01/09/17 91.7 kg (202 lb 2.6 oz)  12/16/16 103.2 kg (227 lb 8.2 oz)  12/15/16 105.2 kg (232 lb)    Physical Exam:  BP 105/66 (BP Location: Right Arm)   Pulse 78   Temp 98.1 F (36.7 C) (Oral)   Resp 18   Wt 91.7 kg (202 lb 2.6 oz)   SpO2 99%   BMI 25.96 kg/m  Constitutional: NAD. Well-developed. Vital signs reviewed.  Mouth-mucosa pink and moist HENT: scalp incision dry, nearly healed Eyes: EOMI. No discharge.  Cardiovascular: IRR/IRR Respiratory: CTA bilaterally without wheezes or rales GI: Soft. Bowel sounds are normal.  Musc:   No LE edema Neurological.Alert and oriented to person, place, month, reason   Motor: B/l UE 4+/5 prox to distal.  B/l LE: 4+/5 proximally to distal Skin: Warm and dry.   Psych: pleasant and cooperative.    Assessment/Plan: 1. Functional deficits secondary to SDH sp craniotomy which require 3+ hours per day of interdisciplinary therapy in a comprehensive inpatient rehab setting. Physiatrist is providing close team supervision and 24 hour management of active medical problems listed below. Physiatrist and rehab team continue to assess barriers to discharge/monitor patient progress toward functional and medical  goals.  Function:  Bathing Bathing position Bathing activity did not occur: Refused Position: Systems developer parts bathed by patient: Right arm, Left arm, Chest, Abdomen, Front perineal area, Buttocks, Right upper leg, Left upper leg, Right lower leg, Left lower leg Body parts bathed by helper: Back  Bathing assist Assist Level: Supervision or verbal cues      Upper Body Dressing/Undressing Upper body dressing Upper body dressing/undressing activity did not occur: Refused What is the patient wearing?: Pull over shirt/dress     Pull over shirt/dress - Perfomed by patient: Thread/unthread right sleeve, Pull shirt over trunk, Put head through opening, Thread/unthread left sleeve Pull over shirt/dress - Perfomed by helper: Thread/unthread left sleeve        Upper body assist Assist Level: Set up, Supervision or verbal cues   Set up : To obtain clothing/put away  Lower Body Dressing/Undressing Lower body dressing   What is the patient wearing?: Underwear, Pants Underwear - Performed by patient: Thread/unthread right underwear leg, Thread/unthread left underwear leg, Pull underwear up/down Underwear - Performed by helper: Thread/unthread left underwear leg Pants- Performed by patient: Thread/unthread right pants leg, Thread/unthread left pants leg, Pull pants up/down Pants- Performed by helper: Thread/unthread left pants leg Non-skid slipper socks- Performed by patient: Don/doff right sock, Don/doff left sock Non-skid slipper socks- Performed by helper: Don/doff right sock, Don/doff left sock   Socks - Performed by helper: Don/doff right sock  Shoes - Performed by patient: Don/doff right shoe, Don/doff left shoe Shoes - Performed by helper: Fasten right, Fasten left       TED Hose - Performed by helper: Don/doff right TED hose, Don/doff left TED hose  Lower body assist Assist for lower body dressing: Touching or steadying assistance (Pt > 75%)       Toileting Toileting Toileting activity did not occur: No continent bowel/bladder event Toileting steps completed by patient: Adjust clothing prior to toileting, Adjust clothing after toileting, Performs perineal hygiene Toileting steps completed by helper: Adjust clothing after toileting Toileting Assistive Devices: Grab bar or rail  Toileting assist Assist level: Touching or steadying assistance (Pt.75%)   Transfers Chair/bed transfer   Chair/bed transfer method: Stand pivot Chair/bed transfer assist level: Supervision or verbal cues Chair/bed transfer assistive device: Armrests, Patent attorney     Max distance: 150 Assist level: Supervision or verbal cues   Wheelchair   Type: Manual Max wheelchair distance: 70 Assist Level: Supervision or verbal cues  Cognition Comprehension Comprehension assist level: Follows basic conversation/direction with extra time/assistive device  Expression Expression assist level: Expresses basic needs/ideas: With no assist  Social Interaction Social Interaction assist level: Interacts appropriately 90% of the time - Needs monitoring or encouragement for participation or interaction.  Problem Solving Problem solving assist level: Solves basic 75 - 89% of the time/requires cueing 10 - 24% of the time  Memory Memory assist level: Recognizes or recalls 50 - 74% of the time/requires cueing 25 - 49% of the time    Medical Problem List and Plan: 1. 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 2. DVT Prophylaxis/Anticoagulation: SCDs.   3. Pain Management: Hydrocodone as needed 4. Mood: Xanax 0.25 mg daily at bedtime,Lexapro 10 mg daily 5. Neuropsych: This patient is notcapable of making decisions on  hisown behalf. 6. Skin/Wound Care: Removed staples 7. Fluids/Electrolytes/Nutrition: Routine I&Os  -eating quite well  -BUN a little elevated--reviewed appropriate fluid intake with patient  -continue same lasix 8.Seizure prophylaxis. Keppra 500 mg every 12 hours 9.Atrial fibrillation/AVR/CABG/PPM. Continue low-dose aspirin. Amiodarone 200 mg twice a day, Toprol 12.5 mg twice a day. Follow-up per cardiology services 10.Dysphagia. Dysphagia 2 with thin. Follow-up speech therapy  - sore throat improved 11.Acute diastolic congestive heart failure.     -weights now around 92-93 kg- 91.7 today    12.Hypokalemia.   K+ 4.3   Continue supplement while taking lasix  13.History of COPD. Check oxygen saturations every shift 14.History of alcohol abuse. Monitor for withdrawal. Provide counseling as appropriate 15.Constipation. Laxative assistance 16. Transaminitis:   Improved    17. Leukocytosis  Resolved--8.9    Will cont to monitor 18. Macrocytic anemia  Hb 12.2     Cont Vit supplementation 19. HTN-   reduced Toprol-XL to 12. 5 bid for hypotension Vitals:   01/08/17 1407 01/09/17 0542  BP: 96/68 105/66  Pulse: 77 78  Resp: 17 18  Temp: 97.9 F (36.6 C) 98.1 F (36.7 C)    improved 20. Urinary retention--voiding incontinently, PVR's low   UCX + for 100k ecoli and 50k klebsiella---  keflex for 7 days (thru Sunday)  -make sure patient is on commode or eob to empty  -timed voids 21. LE edema--improved  Lasix--continue at 20mg  daily for now  LOS (Days) 21 A FACE TO FACE EVALUATION WAS PERFORMED  Nickisha Hum T 01/09/2017 9:11 AM

## 2017-01-09 NOTE — Discharge Summary (Signed)
Discharge summary job [id number]

## 2017-01-09 NOTE — Plan of Care (Signed)
Problem: RH Dressing Goal: LTG Patient will perform lower body dressing w/assist (OT) LTG: Patient will perform lower body dressing with assist, with/without cues in positioning using equipment (OT)  Outcome: Not Met (add Reason) Pt still requires assist to fasten shoes

## 2017-01-09 NOTE — Progress Notes (Signed)
Occupational Therapy Discharge Summary  Patient Details  Name: Alex [name] MRN: [id number] Date of Birth: 1931-07-26  Today's Date: 01/09/2017 OT Individual Time: 8:15-9;15 and 15;00-15:30 60 min and 30 min   Patient has met 11 of 12 long term goals due to improved activity tolerance, improved balance, postural control, ability to compensate for deficits, functional use of  LEFT upper and LEFT lower extremity, improved attention, improved awareness and improved coordination.  Patient to discharge at overall Supervision level.  Patient's care partner is independent to provide the necessary physical and cognitive assistance at discharge.    Reasons goals not met: Pt did not meet LB dressing goal d/t decreased [place] in B hands required to fasten shoe laces.  Recommendation:  Patient will benefit from ongoing skilled OT services in home health setting to continue to advance functional skills in the area of BADL and Reduce care partner burden. Recommend work on balance, endurance, functional mobility, caregiver education, AE/DME education in the home  Equipment: No equipment provided  Reasons for discharge: treatment goals met  Patient/family agrees with progress made and goals achieved: Yes  Skilled Intervention  Session 1: 1:1 no complaints of pain. Pt eats in bed with VC to alternate bites/sips per SLP protocol. Pt supervision overall throughout session for functional mobility with VC for safety awareness. Pt ambulates to shower and sits on TTB to bathe 10/10 body parts with increased time and VC for applying soap to wash cloth to bathe. While seated on TTB, Pt dons UB/LB clothing with VC to orient clothing and to problem solve when pt threads LE into wrong pant leg. Pt experiences shortness of breath and educated on pursed lip breathing. Pt requires increased time to don clothing d/t fatigue. Pt stands at sink to brush teeth and use mouthwash with seated rest break in between. Pt left in room  seated in w/c with quick release belt donned with wife present and all needs met.   Session 2; Pt supine in bed with no complaints of pain and agreeable to tx. In ADL apartment, pt transfers w/c<>EOB<>supine with supervision and Vc for safety awareness. Pt ambulates into bathroom and steps over ledge of walk in shower with supervision and VC to use gab bar. In room, Pt dons B shoes with VC to loosen laces to fit foot into shoe. Pt req A to fasten B shoes d/t decreased [place] in B hands. Pt ambulates into bathroom, completes clothing management with supervision and has already soiled brief/pants. D/t time constraints, OT changes brief/threads BLE into pant legs. Pt advances pants over hips. Pt returns to w/c with  witk release belt donned and all needs met.   OT Discharge Precautions/Restrictions  Precautions Precautions: Fall Precaution Comments:  decreased motor planning Restrictions Weight Bearing Restrictions: No General   Vital Signs  Pain Pain Assessment Pain Assessment: No/denies pain ADL   see functional navigator Vision/Perception  Vision- History Baseline Vision/History: Wears glasses Wears Glasses: Reading only Patient Visual Report: No change from baseline Vision- Assessment Vision Assessment?: Yes Ocular Range of Motion: Within Functional Limits Tracking/Visual Pursuits: Able to track stimulus in all quads without difficulty  Cognition Overall Cognitive Status: Impaired/Different from baseline Arousal/Alertness: Awake/alert Orientation Level: Oriented to person;Oriented to place;Oriented to time;Oriented to situation Attention: Selective Sustained Attention: Appears intact Sustained Attention Impairment: Verbal basic;Functional basic Selective Attention: Appears intact Memory: Impaired Memory Impairment: Decreased recall of new information Awareness: Appears intact Problem Solving: Impaired Problem Solving Impairment: Functional basic;Functional  complex Safety/Judgment: Appears intact [place]  [employer] Scales of Cognitive Functioning: Purposeful/appropriate Sensation Sensation Light Touch: Impaired Detail Light Touch Impaired Details: Impaired LLE Proprioception: Impaired Detail Proprioception Impaired Details: Impaired LLE Coordination Gross Motor Movements are Fluid and Coordinated: No Fine Motor Movements are Fluid and Coordinated: No Coordination and Movement Description: decr coordination left>right Motor  Motor Motor: Hemiplegia Motor - Skilled Clinical Observations: generalized weakness of LUE Mobility  Bed Mobility Bed Mobility: Supine to Sit Supine to Sit: 5: Supervision Transfers Transfers: Sit to Stand;Stand to Sit Sit to Stand: 5: Supervision Stand to Sit: 5: Supervision  Trunk/Postural Assessment  Cervical Assessment Cervical Assessment:  (forward head) Thoracic Assessment Thoracic Assessment:  (flexed posture) Lumbar Assessment Lumbar Assessment:  (posterior tilt) Postural Control Postural Control: Deficits on evaluation Trunk Control: poor response to LOB backwards in sitting; in static sitting, increased wt bearing L hip Righting Reactions: delayed Protective Responses: delayed  Balance Balance Balance Assessed: Yes Dynamic Sitting Balance Dynamic Sitting - Balance Support: No upper extremity supported;During functional activity;Feet supported Dynamic Sitting - Level of Assistance: 6: Modified independent (Device/Increase time) Dynamic Sitting Balance - Compensations: during MMT with R/L hip flex resistance, pt had slow LOB backwards with no response Static Standing Balance Static Standing - Balance Support: No upper extremity supported Static Standing - Level of Assistance: 6: Modified independent (Device/Increase time) Dynamic Standing Balance Dynamic Standing - Level of Assistance: 5: Stand by assistance Dynamic Standing - Balance Activities: Lateral lean/weight shifting;Forward lean/weight  shifting;Reaching across midline;Reaching for objects Extremity/Trunk Assessment RUE Assessment RUE Assessment: Within Functional Limits LUE Assessment LUE Assessment: Exceptions to WFL LUE AROM (degrees) Overall AROM Left Upper Extremity: Other (comment) (0-90) LUE Strength LUE Overall Strength Comments: 2+/5   See Function Navigator for Current Functional Status.  Lowella Dell Michal Strzelecki 01/09/2017, 6:09 PM

## 2017-01-10 NOTE — Discharge Summary (Signed)
See dictated DC summary for full details - stable and ready for DC home today as planned

## 2017-01-10 NOTE — Progress Notes (Signed)
Patient discharged home with son about [id number]. Per patient and family, patient's wife received discharge instructions yesterday. Family denied any questions. Patient dressing to let elbow changed. Patient stable.

## 2017-01-21 ENCOUNTER — Telehealth: Payer: Self-pay

## 2017-01-21 ENCOUNTER — Emergency Department (HOSPITAL_COMMUNITY): Payer: Medicare Other

## 2017-01-21 ENCOUNTER — Encounter (HOSPITAL_COMMUNITY): Payer: Self-pay | Admitting: Emergency Medicine

## 2017-01-21 ENCOUNTER — Inpatient Hospital Stay (HOSPITAL_COMMUNITY)
Admission: EM | Admit: 2017-01-21 | Discharge: 2017-02-01 | DRG: 853 | Disposition: A | Discharge status: Rehabilitation Facility | Payer: Medicare Other | Attending: Internal Medicine | Admitting: Internal Medicine

## 2017-01-21 DIAGNOSIS — F411 Generalized anxiety disorder: Secondary | ICD-10-CM

## 2017-01-21 DIAGNOSIS — R7881 Bacteremia: Secondary | ICD-10-CM

## 2017-01-21 DIAGNOSIS — F329 Major depressive disorder, single episode, unspecified: Secondary | ICD-10-CM | POA: Diagnosis present

## 2017-01-21 DIAGNOSIS — E785 Hyperlipidemia, unspecified: Secondary | ICD-10-CM

## 2017-01-21 DIAGNOSIS — Z951 Presence of aortocoronary bypass graft: Secondary | ICD-10-CM

## 2017-01-21 DIAGNOSIS — Y95 Nosocomial condition: Secondary | ICD-10-CM | POA: Diagnosis present

## 2017-01-21 DIAGNOSIS — F419 Anxiety disorder, unspecified: Secondary | ICD-10-CM | POA: Diagnosis present

## 2017-01-21 DIAGNOSIS — Z8679 Personal history of other diseases of the circulatory system: Secondary | ICD-10-CM

## 2017-01-21 DIAGNOSIS — I10 Essential (primary) hypertension: Secondary | ICD-10-CM

## 2017-01-21 DIAGNOSIS — J189 Pneumonia, unspecified organism: Secondary | ICD-10-CM

## 2017-01-21 DIAGNOSIS — G40909 Epilepsy, unspecified, not intractable, without status epilepticus: Secondary | ICD-10-CM | POA: Diagnosis present

## 2017-01-21 DIAGNOSIS — N3941 Urge incontinence: Secondary | ICD-10-CM | POA: Diagnosis present

## 2017-01-21 DIAGNOSIS — A419 Sepsis, unspecified organism: Secondary | ICD-10-CM | POA: Diagnosis not present

## 2017-01-21 DIAGNOSIS — Z9981 Dependence on supplemental oxygen: Secondary | ICD-10-CM

## 2017-01-21 DIAGNOSIS — J449 Chronic obstructive pulmonary disease, unspecified: Secondary | ICD-10-CM | POA: Diagnosis present

## 2017-01-21 DIAGNOSIS — N401 Enlarged prostate with lower urinary tract symptoms: Secondary | ICD-10-CM | POA: Diagnosis present

## 2017-01-21 DIAGNOSIS — Z6831 Body mass index (BMI) 31.0-31.9, adult: Secondary | ICD-10-CM

## 2017-01-21 DIAGNOSIS — I2510 Atherosclerotic heart disease of native coronary artery without angina pectoris: Secondary | ICD-10-CM | POA: Diagnosis not present

## 2017-01-21 DIAGNOSIS — D649 Anemia, unspecified: Secondary | ICD-10-CM | POA: Diagnosis present

## 2017-01-21 DIAGNOSIS — N412 Abscess of prostate: Secondary | ICD-10-CM | POA: Diagnosis present

## 2017-01-21 DIAGNOSIS — Z96653 Presence of artificial knee joint, bilateral: Secondary | ICD-10-CM | POA: Diagnosis present

## 2017-01-21 DIAGNOSIS — I482 Chronic atrial fibrillation: Secondary | ICD-10-CM

## 2017-01-21 DIAGNOSIS — N390 Urinary tract infection, site not specified: Secondary | ICD-10-CM

## 2017-01-21 DIAGNOSIS — I110 Hypertensive heart disease with heart failure: Secondary | ICD-10-CM | POA: Diagnosis present

## 2017-01-21 DIAGNOSIS — B961 Klebsiella pneumoniae [K. pneumoniae] as the cause of diseases classified elsewhere: Secondary | ICD-10-CM | POA: Diagnosis present

## 2017-01-21 DIAGNOSIS — A4159 Other Gram-negative sepsis: Principal | ICD-10-CM | POA: Diagnosis present

## 2017-01-21 DIAGNOSIS — I5032 Chronic diastolic (congestive) heart failure: Secondary | ICD-10-CM | POA: Diagnosis present

## 2017-01-21 DIAGNOSIS — Z298 Encounter for other specified prophylactic measures: Secondary | ICD-10-CM

## 2017-01-21 DIAGNOSIS — Z7982 Long term (current) use of aspirin: Secondary | ICD-10-CM

## 2017-01-21 DIAGNOSIS — I4819 Other persistent atrial fibrillation: Secondary | ICD-10-CM | POA: Diagnosis present

## 2017-01-21 DIAGNOSIS — E782 Mixed hyperlipidemia: Secondary | ICD-10-CM

## 2017-01-21 DIAGNOSIS — Z87820 Personal history of traumatic brain injury: Secondary | ICD-10-CM

## 2017-01-21 DIAGNOSIS — I9589 Other hypotension: Secondary | ICD-10-CM

## 2017-01-21 DIAGNOSIS — I82622 Acute embolism and thrombosis of deep veins of left upper extremity: Secondary | ICD-10-CM | POA: Diagnosis not present

## 2017-01-21 DIAGNOSIS — B9620 Unspecified Escherichia coli [E. coli] as the cause of diseases classified elsewhere: Secondary | ICD-10-CM | POA: Diagnosis present

## 2017-01-21 DIAGNOSIS — N138 Other obstructive and reflux uropathy: Secondary | ICD-10-CM | POA: Diagnosis present

## 2017-01-21 DIAGNOSIS — I4821 Permanent atrial fibrillation: Secondary | ICD-10-CM | POA: Diagnosis present

## 2017-01-21 DIAGNOSIS — Z515 Encounter for palliative care: Secondary | ICD-10-CM | POA: Diagnosis not present

## 2017-01-21 DIAGNOSIS — R531 Weakness: Secondary | ICD-10-CM | POA: Diagnosis not present

## 2017-01-21 DIAGNOSIS — I5033 Acute on chronic diastolic (congestive) heart failure: Secondary | ICD-10-CM

## 2017-01-21 DIAGNOSIS — I959 Hypotension, unspecified: Secondary | ICD-10-CM

## 2017-01-21 DIAGNOSIS — J440 Chronic obstructive pulmonary disease with acute lower respiratory infection: Secondary | ICD-10-CM | POA: Diagnosis present

## 2017-01-21 DIAGNOSIS — Z9181 History of falling: Secondary | ICD-10-CM

## 2017-01-21 DIAGNOSIS — Z66 Do not resuscitate: Secondary | ICD-10-CM | POA: Diagnosis present

## 2017-01-21 DIAGNOSIS — R509 Fever, unspecified: Secondary | ICD-10-CM

## 2017-01-21 DIAGNOSIS — D62 Acute posthemorrhagic anemia: Secondary | ICD-10-CM

## 2017-01-21 DIAGNOSIS — Z952 Presence of prosthetic heart valve: Secondary | ICD-10-CM

## 2017-01-21 DIAGNOSIS — M7989 Other specified soft tissue disorders: Secondary | ICD-10-CM

## 2017-01-21 DIAGNOSIS — R609 Edema, unspecified: Secondary | ICD-10-CM

## 2017-01-21 DIAGNOSIS — J690 Pneumonitis due to inhalation of food and vomit: Secondary | ICD-10-CM | POA: Diagnosis present

## 2017-01-21 DIAGNOSIS — Z87891 Personal history of nicotine dependence: Secondary | ICD-10-CM

## 2017-01-21 DIAGNOSIS — G4733 Obstructive sleep apnea (adult) (pediatric): Secondary | ICD-10-CM | POA: Diagnosis present

## 2017-01-21 DIAGNOSIS — Z950 Presence of cardiac pacemaker: Secondary | ICD-10-CM

## 2017-01-21 DIAGNOSIS — I481 Persistent atrial fibrillation: Secondary | ICD-10-CM

## 2017-01-21 LAB — URINALYSIS, ROUTINE W REFLEX MICROSCOPIC
BILIRUBIN URINE: NEGATIVE
Glucose, UA: NEGATIVE mg/dL
HGB URINE DIPSTICK: NEGATIVE
Ketones, ur: NEGATIVE mg/dL
NITRITE: NEGATIVE
Protein, ur: 30 mg/dL — AB
Specific Gravity, Urine: 1.015 (ref 1.005–1.030)
Squamous Epithelial / LPF: NONE SEEN
pH: 6 (ref 5.0–8.0)

## 2017-01-21 LAB — CBC WITH DIFFERENTIAL/PLATELET
Basophils Absolute: 0 10*3/uL (ref 0.0–0.1)
Basophils Relative: 0 %
EOS PCT: 0 %
Eosinophils Absolute: 0 10*3/uL (ref 0.0–0.7)
HCT: 33.2 % — ABNORMAL LOW (ref 39.0–52.0)
Hemoglobin: 10.7 g/dL — ABNORMAL LOW (ref 13.0–17.0)
LYMPHS ABS: 1.9 10*3/uL (ref 0.7–4.0)
LYMPHS PCT: 25 %
MCH: 31.3 pg (ref 26.0–34.0)
MCHC: 32.2 g/dL (ref 30.0–36.0)
MCV: 97.1 fL (ref 78.0–100.0)
MONOS PCT: 3 %
Monocytes Absolute: 0.2 10*3/uL (ref 0.1–1.0)
Neutro Abs: 5.3 10*3/uL (ref 1.7–7.7)
Neutrophils Relative %: 72 %
PLATELETS: 188 10*3/uL (ref 150–400)
RBC: 3.42 MIL/uL — AB (ref 4.22–5.81)
RDW: 14.8 % (ref 11.5–15.5)
WBC: 7.4 10*3/uL (ref 4.0–10.5)

## 2017-01-21 LAB — COMPREHENSIVE METABOLIC PANEL
ALBUMIN: 1.7 g/dL — AB (ref 3.5–5.0)
ALK PHOS: 103 U/L (ref 38–126)
ALT: 97 U/L — AB (ref 17–63)
AST: 95 U/L — ABNORMAL HIGH (ref 15–41)
Anion gap: 10 (ref 5–15)
BUN: 19 mg/dL (ref 6–20)
CHLORIDE: 99 mmol/L — AB (ref 101–111)
CO2: 28 mmol/L (ref 22–32)
CREATININE: 0.91 mg/dL (ref 0.61–1.24)
Calcium: 8 mg/dL — ABNORMAL LOW (ref 8.9–10.3)
GFR calc non Af Amer: >60 mL/min (ref >60)
Glucose, Bld: 110 mg/dL — ABNORMAL HIGH (ref 65–99)
Potassium: 4.6 mmol/L (ref 3.5–5.1)
SODIUM: 137 mmol/L (ref 135–145)
Total Bilirubin: 0.6 mg/dL (ref 0.3–1.2)
Total Protein: 5.1 g/dL — ABNORMAL LOW (ref 6.5–8.1)

## 2017-01-21 LAB — PROTIME-INR
INR: 1.32
Prothrombin Time: 16.5 seconds — ABNORMAL HIGH (ref 11.4–15.2)

## 2017-01-21 LAB — I-STAT CG4 LACTIC ACID, ED: Lactic Acid, Venous: 1.98 mmol/L (ref 0.5–1.9)

## 2017-01-21 MED ORDER — SODIUM CHLORIDE 0.9 % IV BOLUS (SEPSIS)
1000.0000 mL | Freq: Once | INTRAVENOUS | Status: AC
  Administered 2017-01-21: 1000 mL via INTRAVENOUS

## 2017-01-21 MED ORDER — VANCOMYCIN HCL IN DEXTROSE 1-5 GM/200ML-% IV SOLN
1000.0000 mg | Freq: Two times a day (BID) | INTRAVENOUS | Status: DC
  Administered 2017-01-22: 1000 mg via INTRAVENOUS
  Filled 2017-01-21: qty 200

## 2017-01-21 MED ORDER — SODIUM CHLORIDE 0.9 % IV SOLN
1750.0000 mg | Freq: Once | INTRAVENOUS | Status: AC
  Administered 2017-01-21: 1750 mg via INTRAVENOUS
  Filled 2017-01-21: qty 1750

## 2017-01-21 MED ORDER — VANCOMYCIN HCL IN DEXTROSE 1-5 GM/200ML-% IV SOLN: 1000.0000 mg | Freq: Once | INTRAVENOUS | Status: DC

## 2017-01-21 MED ORDER — PIPERACILLIN-TAZOBACTAM 3.375 G IVPB
3.3750 g | Freq: Three times a day (TID) | INTRAVENOUS | Status: DC
  Administered 2017-01-22 (×2): 3.375 g via INTRAVENOUS
  Filled 2017-01-21 (×3): qty 50

## 2017-01-21 MED ORDER — PIPERACILLIN-TAZOBACTAM 3.375 G IVPB 30 MIN
3.3750 g | Freq: Once | INTRAVENOUS | Status: AC
  Administered 2017-01-21: 3.375 g via INTRAVENOUS
  Filled 2017-01-21: qty 50

## 2017-01-21 NOTE — H&P (Signed)
History and Physical    Alex [name] WUJ:[id number] DOB: [birth year] DOA: 01/21/2017  Referring Alex [name]/Alex [name]/PA: Dr. Fredderick [name] PCP: Alex Miles, Alex [name]  Patient coming from: via EMS  Chief Complaint: Weakness  HPI: Alex [name] is a 83 y.o. male with medical history significant of COPD, A. Fib, CAD s/p CABG, OSA on oxygen at night, s/p PPM, s/p AVR, morbid obesity, and traumatic subdural hematoma requiring craniotomy in 11/2016; who presents with complaints of 3 days generalized weakness. He was recently hospitalized in 11/2016 with subdural hematoma requiring craniotomy. Thereafter has had subsequent readmissions and prolonged rehabilitation stay for which he was just recently discharged home with 24-hour nursing care 1-1/2 weeks ago. Since being home his wife notes that he required significant assistance to ambulate. He notes that he has intermittent confusion that usually occurs more so in the evenings. Associated symptoms include decreased appetite, increasing, urinary frequency, subjective fevers, and a dry cough. Denies any significant weight changes, leg swelling, orthopnea, recent sick contacts, bleeding, constipation, or chest pain. Patient has chronically been in atrial fibrillation and had an unsuccessful trial of cardioversion on 1/25 with Alex [name]. Following the subdural hematoma patient was taken off of Coumadin and has only been on a baby aspirin daily. Patient is followed by Alex [name] of cardiology.  ED Course: Upon admission into the emergency department patient was seen to be febrile until 101.29F, pulse rate 38-81, respirations 17-28, blood pressure as low as 80/42, and O2 sats maintained on 2 L. Labs revealed WBC 7.4, hemoglobin 10.7, albumin 1.7, AST 95, ALT 97, lactic acid 1.98, and all Alex labs relatively within normal limits. Urinalysis was seen to be positive for signs of infection. Imaging studies of the lungs given concern for possible pneumonia vs. atelectasis vs. postinflammatory  changes. Sepsis protocol was initiated with vancomycin and Zosyn.   Review of Systems: As per HPI otherwise 10 [name] review of systems negative.   Past Medical History:  Diagnosis Date  . Aortic stenosis    s/p AVR by Alex [name]  . COPD (chronic obstructive pulmonary disease) (HCC)   . History of coronary artery disease    status post stenting of the marginal circumflex in 12/2003 and again in 2009  . Hyperlipidemia   . Morbid obesity (HCC)    weight 243 pounds, BMI 31.2kg/m2, BSA 2.36 square meters  . Obstructive sleep apnea    compliant with CPAP  . Persistent atrial fibrillation [facility])     Past Surgical History:  Procedure Laterality Date  . AORTIC VALVE REPLACEMENT (AVR)/CORONARY ARTERY BYPASS GRAFTING (CABG)   08/05/2011   LIMA to LAD, sequential saphenous vein graft to third and fourth obtuse marginal branches of the circumflex, aortic valve replacement using a 23 mm Edwards pericardial valve  . APPENDECTOMY    . CARDIOVERSION N/A 11/14/2014   Procedure: CARDIOVERSION;  Surgeon: Alex Schultz, Alex [name];  Location: [facility] ENDOSCOPY;  Service: Cardiovascular;  Laterality: N/A;  . CARDIOVERSION N/A 11/20/2016   Procedure: CARDIOVERSION;  Surgeon: Alex Riffle, Alex [name];  Location: [facility] ENDOSCOPY;  Service: Cardiovascular;  Laterality: N/A;  . CAROTID ENDARTERECTOMY     Alex [name]  . CRANIOTOMY Right 12/16/2016   Procedure: CRANIOTOMY HEMATOMA EVACUATION SUBDURAL;  Surgeon: Alex Halt Ditty, Alex [name];  Location: [facility] OR;  Service: Neurosurgery;  Laterality: Right;  . EP IMPLANTABLE DEVICE N/A 11/06/2015   Procedure: PPM Generator Changeout;for sick sinus syndrome with a MDT Adapta L PPM, chronically elevated RV threshold.  . permanent pacemaker     MDT EnRhythm  implanted by Alex Alex [name]  . REPLACEMENT TOTAL KNEE BILATERAL     2006  . TEE WITHOUT CARDIOVERSION N/A 11/14/2014   Procedure: TRANSESOPHAGEAL ECHOCARDIOGRAM (TEE);  Surgeon: Alex Schultz, Alex [name];   Location: [facility] ENDOSCOPY;  Service: Cardiovascular;  Laterality: N/A;     reports that he quit smoking about 13 years ago. His smoking use included Cigarettes. He started smoking about 43 years ago. He has a 15.00 pack-year smoking history. He has never used smokeless tobacco. He reports that he drinks alcohol. He reports that he does not use drugs.  No Known Allergies  Family History  Problem Relation Age of Onset  . Alex [name]   . Alex [name]   . Alex [name]     Spinal Meningitis    Prior to Admission medications   Medication Sig Start Date End Date Taking? Authorizing Provider  acetaminophen (TYLENOL) 500 MG tablet Take 1,000 mg by mouth every 6 (six) hours as needed.   Yes Historical Provider, Alex [name]  ALPRAZolam (XANAX) 0.25 MG tablet Take 1 tablet (0.25 mg total) by mouth at bedtime. 01/09/17  Yes Alex J Angiulli, Alex [name]  amiodarone (PACERONE) 200 MG tablet Take 1 tablet (200 mg total) by mouth 2 (two) times daily. 01/09/17  Yes Alex J Angiulli, Alex [name]  aspirin 81 MG tablet Take 81 mg by mouth daily.   Yes Historical Provider, Alex [name]  atorvastatin (LIPITOR) 40 MG tablet Take 1 tablet (40 mg total) by mouth daily. 01/10/17  Yes Alex J Angiulli, Alex [name]  B Complex Vitamins (VITAMIN B COMPLEX PO) Take 1 tablet by mouth daily.    Yes Historical Provider, Alex [name]  escitalopram (LEXAPRO) 10 MG tablet Take 1 tablet (10 mg total) by mouth at bedtime. 01/09/17  Yes Alex J Angiulli, Alex [name]  furosemide (LASIX) 20 MG tablet Take 1 tablet (20mg ) by mouth daily. May take extra tablet for weight gain >3lbs 01/09/17  Yes Alex J Angiulli, Alex [name]  levETIRAcetam (KEPPRA) 500 MG tablet Take 1 tablet (500 mg total) by mouth 2 (two) times daily. 01/09/17  Yes Alex J Angiulli, Alex [name]  Magnesium 250 MG TABS Take 1 tablet (250 mg total) by mouth daily. 11/04/16  Yes Alex Nip, Alex [name]  metoprolol succinate (TOPROL-XL) 25 MG 24 hr tablet Take 0.5 tablets (12.5 mg total) by mouth daily. 01/10/17  Yes Alex J  Angiulli, Alex [name]  naproxen sodium (ANAPROX) 220 MG tablet Take 220 mg by mouth 2 (two) times daily as needed.   Yes Historical Provider, Alex [name]  potassium chloride SA (K-DUR,KLOR-CON) 20 MEQ tablet Take 1 tablet (20 mEq total) by mouth daily. 01/10/17  Yes Alex J Angiulli, Alex [name]  cephALEXin (KEFLEX) 250 MG capsule Take 1 capsule (250 mg total) by mouth every 8 (eight) hours. Patient not taking: Reported on 01/21/2017 01/09/17   Charlton Amor, Alex [name]    Physical Exam: Constitutional:Elderly male who appears to be acutely ill, but nontoxic. Able to follow commands. Vitals:   01/21/17 2130 01/21/17 2200 01/21/17 2215 01/21/17 2230  BP: (!) 89/53 (!) 80/43 (!) 95/55 (!) 103/53  Pulse: 75 68 72 74  Resp: (!) 22 20 20  (!) 23  Temp:      TempSrc:      SpO2: 96% 99% 99% 99%  Weight:      Height:       Eyes: PERRL, lids and conjunctivae normal ENMT: Mucous membranes are moist. Posterior pharynx clear of any exudate or lesions.  Neck: normal, supple, no  masses, no thyromegaly Respiratory: Mildly tachypneic with decreased overall aeration. No accessory muscle use.  Cardiovascular: Irregular irregular, no murmurs / rubs / gallops. Trace lower extremity pitting edema. 2+ pedal pulses. No carotid bruits.  Abdomen: no tenderness, no masses palpated. No hepatosplenomegaly. Bowel sounds positive.  Musculoskeletal: no clubbing / cyanosis. No joint deformity upper and lower extremities. Good ROM, no contractures. Normal muscle tone.  Skin: no rashes, ulcers. No induration. Healing craniotomy scar Neurologic: CN 2-12 grossly intact. Sensation intact, DTR normal. Strength 5/5 in all 4.  Psychiatric: Normal judgment and insight. Alert and oriented x 3. Normal mood.     Labs on Admission: I have personally reviewed following labs and imaging studies  CBC:  Recent Labs Lab 01/21/17 1845  WBC 7.4  NEUTROABS 5.3  HGB 10.7*  HCT 33.2*  MCV 97.1  PLT 188   Basic Metabolic Panel:  Recent Labs Lab  01/21/17 1845  NA 137  K 4.6  CL 99*  CO2 28  GLUCOSE 110*  BUN 19  CREATININE 0.91  CALCIUM 8.0*   GFR: Estimated Creatinine Clearance: 70.3 mL/min (by C-G formula based on SCr of 0.91 mg/dL). Liver Function Tests:  Recent Labs Lab 01/21/17 1845  AST 95*  ALT 97*  ALKPHOS 103  BILITOT 0.6  PROT 5.1*  ALBUMIN 1.7*   No results for input(s): LIPASE, AMYLASE in the last 168 hours. No results for input(s): AMMONIA in the last 168 hours. Coagulation Profile:  Recent Labs Lab 01/21/17 1845  INR 1.32   Cardiac Enzymes: No results for input(s): CKTOTAL, CKMB, CKMBINDEX, TROPONINI in the last 168 hours. BNP (last 3 results) No results for input(s): PROBNP in the last 8760 hours. HbA1C: No results for input(s): HGBA1C in the last 72 hours. CBG: No results for input(s): GLUCAP in the last 168 hours. Lipid Profile: No results for input(s): CHOL, HDL, LDLCALC, TRIG, CHOLHDL, LDLDIRECT in the last 72 hours. Thyroid Function Tests: No results for input(s): TSH, T4TOTAL, FREET4, T3FREE, THYROIDAB in the last 72 hours. Anemia Panel: No results for input(s): VITAMINB12, FOLATE, FERRITIN, TIBC, IRON, RETICCTPCT in the last 72 hours. Urine analysis:    Component Value Date/Time   COLORURINE AMBER (A) 01/21/2017 2031   APPEARANCEUR HAZY (A) 01/21/2017 2031   LABSPEC 1.015 01/21/2017 2031   PHURINE 6.0 01/21/2017 2031   GLUCOSEU NEGATIVE 01/21/2017 2031   HGBUR NEGATIVE 01/21/2017 2031   BILIRUBINUR NEGATIVE 01/21/2017 2031   KETONESUR NEGATIVE 01/21/2017 2031   PROTEINUR 30 (A) 01/21/2017 2031   NITRITE NEGATIVE 01/21/2017 2031   LEUKOCYTESUR TRACE (A) 01/21/2017 2031   Sepsis Labs: No results found for this or any previous visit (from the past 240 hour(s)).   Radiological Exams on Admission: Dg Chest 2 View  Result Date: 01/21/2017 CLINICAL DATA:  Generalize weakness for 3 days. EXAM: CHEST  2 VIEW COMPARISON:  12/11/2016 FINDINGS: The pacer wires stable. The cardiac  silhouette, mediastinal and hilar contours are unchanged. Moderate tortuosity and calcification of the thoracic aorta. Streaky bibasilar density suspicious for pneumonia. No obvious pleural effusions. IMPRESSION: Suspect bibasilar infiltrates/pneumonia.  Possible aspiration. Electronically Signed   By: Rudie MeyerP.  Gallerani M.D.   On: 01/21/2017 20:02   Ct Head Wo Contrast  Result Date: 01/21/2017 CLINICAL DATA:  Mental status change EXAM: CT HEAD WITHOUT CONTRAST TECHNIQUE: Contiguous axial images were obtained from the base of the skull through the vertex without intravenous contrast. COMPARISON:  12/17/2016 FINDINGS: Brain: Residual right cerebral convexity subdural hematoma, smaller than on prior exam measuring  up to 6 mm in thickness versus 14 mm previously. Overlying right-sided dural thickening is seen over the right cerebral convexity with post craniotomy change noted of right bony calvarium. No acute intracranial hemorrhage, intra-axial mass nor large vascular territory infarct. Chronic small vessel ischemic disease of periventricular white matter. Decrease in right to left midline shift now approximately 2 mm versus 9 mm previously. Vascular: Moderate bilateral carotid siphon calcifications. Skull: Right parietal craniotomy Sinuses/Orbits: Clear mastoids. Minimal mucosal thickening of ethmoid sinus. The frontal, sphenoid and visualized maxillary sinuses are nonacute. Intact globes bilaterally. Bilateral lens replacements surgeries. Alex: Resolution of pneumocephalus and decrease in scalp soft tissue swelling of the right since prior. IMPRESSION: 1. Some residual chronic subdural fluid is seen overlying the right cerebral convexity though decreased since prior, now measuring up to 6 mm in thickness versus 14 mm previously. There has been some interval development of right-sided dural thickening which can be seen in this setting since prior. 2. Resolution of pneumocephalus. 3. Interval decrease in right to left  midline shift since prior. 4. No acute nor new findings since previous exam. Chronic small vessel ischemic disease of periventricular white matter. Electronically Signed   By: Tollie Eth M.D.   On: 01/21/2017 21:24   Ct Chest Wo Contrast  Result Date: 01/21/2017 CLINICAL DATA:  Pneumonia EXAM: CT CHEST WITHOUT CONTRAST TECHNIQUE: Multidetector CT imaging of the chest was performed following the standard protocol without IV contrast. COMPARISON:  None. FINDINGS: Cardiovascular: Left-sided pacemaker apparatus with right atrial right ventricular leads are noted. Median sternotomy sutures from prior CABG. Aortic atherosclerosis with coronary arteriosclerosis. No pericardial effusion. Mediastinum/Nodes: No mediastinal or definite hilar lymphadenopathy given limitations of a noncontrast study. No thyromegaly or mass. Small secretions noted in the distal trachea on the right. Esophagus is unremarkable. Lungs/Pleura: Mosaic attenuation of the lungs possibly related to constrictive bronchiolitis. Ill-defined left upper lobe spiculation measuring 2.7 x 1.5 x 0.7 cm. Findings could be a focus of atelectasis, focus of prior pneumonic consolidation and/or scarring. Subpleural densities in the right lower lobe series 4, image 90 and 106 are identified potentially postinfectious or postinflammatory in etiology as well. Follow-up to assure resolution is recommended and to exclude Alex etiologies including neoplasm. Upper Abdomen: No acute abnormality. Musculoskeletal: No chest wall mass or suspicious bone lesions identified. Degenerative osteophytes noted along the dorsal spine. IMPRESSION: 1. 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 Subpleural right lower lobe and intraparenchymal left upper lobe ill-defined opacities are seen which may reflect more confluent areas of pneumonia, atelectasis or postinflammatory change. As neoplasm cannot be entirely excluded, recommend followup short  term to assure improvement or resolution. 2. Aortic atherosclerosis.  Status post CABG. Electronically Signed   By: Tollie Eth M.D.   On: 01/21/2017 21:34    EKG: Independently reviewed. Atrial fibrillation  Assessment/Plan Sepsis secondary to urinary tract infection and/or pneumonia: Acute. Patient presents with generalized weakness, cough, and urinary frequency. Found to be hypotensive with fever up to 101.30F, elevated lactic acid, and tachypnea.  Urinalysis appears like likely source of infection, but chest imaging studies also revealed possibility of pneumonia. - Admit to a telemetry bed  - Sepsis protocol initiated - Follow up blood, sputum, and urine cultures - Follow-up procalcitonin and respiratory viral panel - Continue empiric antibiotics of vancomycin and Zosyn - Trend lactic acid level - Mucinex   Transient hypotension: Acute. Initial blood pressure is noted as low as 80/42. Blood pressures responded to IV fluids given in ED. patient  was given 3 liters o IV fluids while in the ED.  - IVF NS at 75 ml/hr - Continue to monitor   Chronic A. Fib: chadsvasc score >4 however unable to be on anticoagulation secondary to subdural bleed. - Continue ASA, amiodarone, and metoprolol (As tolerated by blood pressure)  Essential hypertension -Will need to restart Lasix when medically appropriate   CAD post CABG/Status post PPM /AVR  Depression/Anxiety  - Continue Lexapro and Xanax   Anemia: Hemoglobin 10.7 and it appears at baseline has been normally 11-12. - Repeat CBC in a.m.  HLD - Continue lipitor for now and hold if liver enzymes continued trending upward.  Intermittent confusion: Could possibly be related to acute infection. - Neuro checks q 4hr x4    TBI with H/O SDH require craniotomy: Stable. CT imaging showing decrease in size of fluid collection from previous CTs.  History of seizures - Continued Keppra  Elevated transaminases: Mild elevation in AST and ALT on  admission. - Recheck a CMP in a.m.   DVT prophylaxis: scds Code Status: DNR Family Communication:  discussed plan of care with patient and family present at bedside  Disposition Plan: Discharge home once medically stable  Consults called: none Admission status: Inpatient  Clydie Braun Alex [name] [facility] Pager [phone]  If 7PM-7AM, please contact night-coverage [email or web address] Password [facility]  01/21/2017, 11:06 PM

## 2017-01-21 NOTE — ED Provider Notes (Signed)
[facility]-EMERGENCY DEPT Provider Note   CSN: [id number] Arrival date & time: 01/21/17  1806     History   Chief Complaint Chief Complaint  Patient presents with  . Weakness    HPI Alex [name] is a 83 y.o. male.  Patient hospitalized from February 7 until March 17. Traumatic subdural hematomas. Patient for the past 3 days has had worsening generalized weakness and they felt as if the urine had a strong odor. Patient brought in by EMS initial blood pressure reading was a systolic of 88 to receive 250 bolus and blood pressure was normal upon arrival. Patient denied a cough. Patient did have a fever upon arrival.      Past Medical History:  Diagnosis Date  . Aortic stenosis    s/p AVR by Dr Laneta Simmers  . COPD (chronic obstructive pulmonary disease) (HCC)   . History of coronary artery disease    status post stenting of the marginal circumflex in 12/2003 and again in 2009  . Hyperlipidemia   . Morbid obesity (HCC)    weight 243 pounds, BMI 31.2kg/m2, BSA 2.36 square meters  . Obstructive sleep apnea    compliant with CPAP  . Persistent atrial fibrillation [facility])     Patient Active Problem List   Diagnosis Date Noted  . Peripheral edema   . Bacterial UTI   . Urinary retention   . Hypertension   . Reactive hypertension   . Transaminitis   . Traumatic brain injury with loss of consciousness of 31 minutes to 59 minutes (HCC) 12/19/2016  . Acute pulmonary edema (HCC) 12/12/2016  . Acute diastolic CHF (congestive heart failure) (HCC) 12/12/2016  . Atrial flutter (HCC) 12/12/2016  . Traumatic subdural bleed with LOC of 1 hour to 5 hours 59 minutes (HCC) 12/11/2016  . Class 1 obesity due to excess calories with body mass index (BMI) of 30.0 to 30.9 in adult   . ETOH abuse   . Chronic obstructive pulmonary disease (HCC)   . Supplemental oxygen dependent   . Alcohol use   . OSA on CPAP   . Atrial fibrillation (HCC)   . Coronary artery disease involving coronary bypass graft of  native heart without angina pectoris   . Seizures (HCC)   . Dysphagia   . Bradycardia   . Hyperglycemia   . Agitation   . Hypokalemia   . Hypernatremia   . Leukocytosis   . Macrocytic anemia   . Thrombocytopenia (HCC)   . Traumatic subdural hematoma without loss of consciousness (HCC)   . Change in mental status   . Subdural hematoma (HCC) 12/03/2016  . Sick sinus syndrome (HCC) 11/06/2015  . PAF (paroxysmal atrial fibrillation) (HCC) 11/14/2014  . SOB (shortness of breath) 10/31/2014  . Persistent atrial fibrillation (HCC) 10/31/2014  . Aortic stenosis, severe 07/28/2011  . 3-vessel coronary artery disease 07/28/2011  . Hyperlipidemia, mixed 07/28/2011    Past Surgical History:  Procedure Laterality Date  . AORTIC VALVE REPLACEMENT (AVR)/CORONARY ARTERY BYPASS GRAFTING (CABG)   08/05/2011   LIMA to LAD, sequential saphenous vein graft to third and fourth obtuse marginal branches of the circumflex, aortic valve replacement using a 23 mm Edwards pericardial valve  . APPENDECTOMY    . CARDIOVERSION N/A 11/14/2014   Procedure: CARDIOVERSION;  Surgeon: Donato Schultz, MD;  Location: [facility] ENDOSCOPY;  Service: Cardiovascular;  Laterality: N/A;  . CARDIOVERSION N/A 11/20/2016   Procedure: CARDIOVERSION;  Surgeon: Pricilla Riffle, MD;  Location: [facility] ENDOSCOPY;  Service: Cardiovascular;  Laterality: N/A;  .  CAROTID ENDARTERECTOMY     Dr Lollie Sails  . CRANIOTOMY Right 12/16/2016   Procedure: CRANIOTOMY HEMATOMA EVACUATION SUBDURAL;  Surgeon: Loura Halt Ditty, MD;  Location: [facility] OR;  Service: Neurosurgery;  Laterality: Right;  . EP IMPLANTABLE DEVICE N/A 11/06/2015   Procedure: PPM Generator Changeout;for sick sinus syndrome with a MDT Adapta L PPM, chronically elevated RV threshold.  . permanent pacemaker     MDT EnRhythm implanted by Dr Lawanda Cousins in [place] for complete heart block with syncope  . REPLACEMENT TOTAL KNEE BILATERAL     2006  . TEE WITHOUT CARDIOVERSION N/A 11/14/2014    Procedure: TRANSESOPHAGEAL ECHOCARDIOGRAM (TEE);  Surgeon: Donato Schultz, MD;  Location: [facility] ENDOSCOPY;  Service: Cardiovascular;  Laterality: N/A;       Home Medications    Prior to Admission medications   Medication Sig Start Date End Date Taking? Authorizing Provider  acetaminophen (TYLENOL) 500 MG tablet Take 1,000 mg by mouth every 6 (six) hours as needed.   Yes Historical Provider, MD  ALPRAZolam (XANAX) 0.25 MG tablet Take 1 tablet (0.25 mg total) by mouth at bedtime. 01/09/17  Yes Daniel J Angiulli, PA-C  amiodarone (PACERONE) 200 MG tablet Take 1 tablet (200 mg total) by mouth 2 (two) times daily. 01/09/17  Yes Daniel J Angiulli, PA-C  aspirin 81 MG tablet Take 81 mg by mouth daily.   Yes Historical Provider, MD  atorvastatin (LIPITOR) 40 MG tablet Take 1 tablet (40 mg total) by mouth daily. 01/10/17  Yes Daniel J Angiulli, PA-C  B Complex Vitamins (VITAMIN B COMPLEX PO) Take 1 tablet by mouth daily.    Yes Historical Provider, MD  escitalopram (LEXAPRO) 10 MG tablet Take 1 tablet (10 mg total) by mouth at bedtime. 01/09/17  Yes Daniel J Angiulli, PA-C  furosemide (LASIX) 20 MG tablet Take 1 tablet (20mg ) by mouth daily. May take extra tablet for weight gain >3lbs 01/09/17  Yes Daniel J Angiulli, PA-C  levETIRAcetam (KEPPRA) 500 MG tablet Take 1 tablet (500 mg total) by mouth 2 (two) times daily. 01/09/17  Yes Daniel J Angiulli, PA-C  Magnesium 250 MG TABS Take 1 tablet (250 mg total) by mouth daily. 11/04/16  Yes Newman Nip, NP  metoprolol succinate (TOPROL-XL) 25 MG 24 hr tablet Take 0.5 tablets (12.5 mg total) by mouth daily. 01/10/17  Yes Daniel J Angiulli, PA-C  naproxen sodium (ANAPROX) 220 MG tablet Take 220 mg by mouth 2 (two) times daily as needed.   Yes Historical Provider, MD  potassium chloride SA (K-DUR,KLOR-CON) 20 MEQ tablet Take 1 tablet (20 mEq total) by mouth daily. 01/10/17  Yes Daniel J Angiulli, PA-C  cephALEXin (KEFLEX) 250 MG capsule Take 1 capsule (250 mg total) by  mouth every 8 (eight) hours. Patient not taking: Reported on 01/21/2017 01/09/17   Mcarthur Rossetti Angiulli, PA-C    Family History Family History  Problem Relation Age of Onset  . Alzheimer's disease Mother   . Tuberculosis Father   . Other Father     Spinal Meningitis    Social History Social History  Substance Use Topics  . Smoking status: Former Smoker    Packs/day: 0.50    Years: 30.00    Types: Cigarettes    Start date: 10/27/1973    Quit date: 10/28/2003  . Smokeless tobacco: Never Used  . Alcohol use 0.0 oz/week     Comment: 8 oz of wine per night     Allergies   Patient has no known allergies.  Review of Systems Review of Systems  Constitutional: Positive for fatigue.  Eyes: Negative for redness.  Respiratory: Negative for shortness of breath.   Cardiovascular: Negative for chest pain.  Gastrointestinal: Negative for abdominal pain.  Genitourinary: Negative for hematuria.  Musculoskeletal: Negative for back pain and neck pain.  Skin: Negative for rash.  Neurological: Positive for weakness. Negative for syncope.  Hematological: Does not bruise/bleed easily.     Physical Exam Updated Vital Signs BP 115/68   Pulse 61   Temp (!) 101.8 F (38.8 C) (Rectal)   Resp (!) 21   Ht 6\' 2"  (1.88 m)   Wt 91.6 kg   SpO2 99%   BMI 25.94 kg/m   Physical Exam  Constitutional: He appears well-developed and well-nourished. No distress.  HENT:  Head: Normocephalic and atraumatic.  Mouth/Throat: Oropharynx is clear and moist.  Eyes: Conjunctivae and EOM are normal. Pupils are equal, round, and reactive to light.  Neck: Normal range of motion. Neck supple.  Cardiovascular: Normal rate, regular rhythm and normal heart sounds.   Pulmonary/Chest: Effort normal and breath sounds normal. No respiratory distress.  Abdominal: Soft. Bowel sounds are normal. There is no tenderness.  Musculoskeletal: Normal range of motion.  Neurological: He is alert. No cranial nerve deficit or  sensory deficit. He exhibits normal muscle tone. Coordination normal.  Skin: Skin is warm.  Nursing note and vitals reviewed.    ED Treatments / Results  Labs (all labs ordered are listed, but only abnormal results are displayed) Labs Reviewed  COMPREHENSIVE METABOLIC PANEL - Abnormal; Notable for the following:       Result Value   Chloride 99 (*)    Glucose, Bld 110 (*)    Calcium 8.0 (*)    Total Protein 5.1 (*)    Albumin 1.7 (*)    AST 95 (*)    ALT 97 (*)    All other components within normal limits  CBC WITH DIFFERENTIAL/PLATELET - Abnormal; Notable for the following:    RBC 3.42 (*)    Hemoglobin 10.7 (*)    HCT 33.2 (*)    All other components within normal limits  PROTIME-INR - Abnormal; Notable for the following:    Prothrombin Time 16.5 (*)    All other components within normal limits  URINALYSIS, ROUTINE W REFLEX MICROSCOPIC - Abnormal; Notable for the following:    Color, Urine AMBER (*)    APPearance HAZY (*)    Protein, ur 30 (*)    Leukocytes, UA TRACE (*)    Bacteria, UA MANY (*)    All other components within normal limits  I-STAT CG4 LACTIC ACID, ED - Abnormal; Notable for the following:    Lactic Acid, Venous 1.98 (*)    All other components within normal limits  CULTURE, BLOOD (ROUTINE X 2)  CULTURE, BLOOD (ROUTINE X 2)  URINE CULTURE  I-STAT CG4 LACTIC ACID, ED  I-STAT CG4 LACTIC ACID, ED  I-STAT CG4 LACTIC ACID, ED    EKG  EKG Interpretation  Date/Time:  Wednesday January 21 2017 18:28:02 EDT Ventricular Rate:  81 PR Interval:    QRS Duration: 88 QT Interval:  330 QTC Calculation: 366 R Axis:   86 Text Interpretation:  Atrial flutter Aberrant conduction of SV complex(es) Borderline right axis deviation Low voltage, extremity leads ST elevation, consider inferior injury Interpretation limited secondary to artifact Confirmed by Loomis Anacker  MD, Terresa Marlett [phone]([id number]) on 01/21/2017 7:56:10 PM       Radiology Dg Chest 2 View  Result Date:  01/21/2017 CLINICAL DATA:  Generalize weakness for 3 days. EXAM: CHEST  2 VIEW COMPARISON:  12/11/2016 FINDINGS: The pacer wires stable. The cardiac silhouette, mediastinal and hilar contours are unchanged. Moderate tortuosity and calcification of the thoracic aorta. Streaky bibasilar density suspicious for pneumonia. No obvious pleural effusions. IMPRESSION: Suspect bibasilar infiltrates/pneumonia.  Possible aspiration. Electronically Signed   By: Rudie Meyer M.D.   On: 01/21/2017 20:02   Ct Head Wo Contrast  Result Date: 01/21/2017 CLINICAL DATA:  Mental status change EXAM: CT HEAD WITHOUT CONTRAST TECHNIQUE: Contiguous axial images were obtained from the base of the skull through the vertex without intravenous contrast. COMPARISON:  12/17/2016 FINDINGS: Brain: Residual right cerebral convexity subdural hematoma, smaller than on prior exam measuring up to 6 mm in thickness versus 14 mm previously. Overlying right-sided dural thickening is seen over the right cerebral convexity with post craniotomy change noted of right bony calvarium. No acute intracranial hemorrhage, intra-axial mass nor large vascular territory infarct. Chronic small vessel ischemic disease of periventricular white matter. Decrease in right to left midline shift now approximately 2 mm versus 9 mm previously. Vascular: Moderate bilateral carotid siphon calcifications. Skull: Right parietal craniotomy Sinuses/Orbits: Clear mastoids. Minimal mucosal thickening of ethmoid sinus. The frontal, sphenoid and visualized maxillary sinuses are nonacute. Intact globes bilaterally. Bilateral lens replacements surgeries. Other: Resolution of pneumocephalus and decrease in scalp soft tissue swelling of the right since prior. IMPRESSION: 1. Some residual chronic subdural fluid is seen overlying the right cerebral convexity though decreased since prior, now measuring up to 6 mm in thickness versus 14 mm previously. There has been some interval development  of right-sided dural thickening which can be seen in this setting since prior. 2. Resolution of pneumocephalus. 3. Interval decrease in right to left midline shift since prior. 4. No acute nor new findings since previous exam. Chronic small vessel ischemic disease of periventricular white matter. Electronically Signed   By: Tollie Eth M.D.   On: 01/21/2017 21:24   Ct Chest Wo Contrast  Result Date: 01/21/2017 CLINICAL DATA:  Pneumonia EXAM: CT CHEST WITHOUT CONTRAST TECHNIQUE: Multidetector CT imaging of the chest was performed following the standard protocol without IV contrast. COMPARISON:  None. FINDINGS: Cardiovascular: Left-sided pacemaker apparatus with right atrial right ventricular leads are noted. Median sternotomy sutures from prior CABG. Aortic atherosclerosis with coronary arteriosclerosis. No pericardial effusion. Mediastinum/Nodes: No mediastinal or definite hilar lymphadenopathy given limitations of a noncontrast study. No thyromegaly or mass. Small secretions noted in the distal trachea on the right. Esophagus is unremarkable. Lungs/Pleura: Mosaic attenuation of the lungs possibly related to constrictive bronchiolitis. Ill-defined left upper lobe spiculation measuring 2.7 x 1.5 x 0.7 cm. Findings could be a focus of atelectasis, focus of prior pneumonic consolidation and/or scarring. Subpleural densities in the right lower lobe series 4, image 90 and 106 are identified potentially postinfectious or postinflammatory in etiology as well. Follow-up to assure resolution is recommended and to exclude other etiologies including neoplasm. Upper Abdomen: No acute abnormality. Musculoskeletal: No chest wall mass or suspicious bone lesions identified. Degenerative osteophytes noted along the dorsal spine. IMPRESSION: 1. 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 Subpleural right lower lobe and intraparenchymal left upper lobe ill-defined opacities are seen which may  reflect more confluent areas of pneumonia, atelectasis or postinflammatory change. As neoplasm cannot be entirely excluded, recommend followup short term to assure improvement or resolution. 2. Aortic atherosclerosis.  Status post CABG. Electronically Signed   By: Rene Kocher.D.  On: 01/21/2017 21:34    Procedures Procedures (including critical care time)  CRITICAL CARE Performed by: Vanetta Mulders Total critical care time: 30 minutes Critical care time was exclusive of separately billable procedures and treating other patients. Critical care was necessary to treat or prevent imminent or life-threatening deterioration. Critical care was time spent personally by me on the following activities: development of treatment plan with patient and/or surrogate as well as nursing, discussions with consultants, evaluation of patient's response to treatment, examination of patient, obtaining history from patient or surrogate, ordering and performing treatments and interventions, ordering and review of laboratory studies, ordering and review of radiographic studies, pulse oximetry and re-evaluation of patient's condition.   Medications Ordered in ED Medications  vancomycin (VANCOCIN) IVPB 1000 mg/200 mL premix (not administered)  piperacillin-tazobactam (ZOSYN) IVPB 3.375 g (not administered)  piperacillin-tazobactam (ZOSYN) IVPB 3.375 g (0 g Intravenous Stopped 01/21/17 2238)  vancomycin (VANCOCIN) 1,750 mg in sodium chloride 0.9 % 500 mL IVPB (0 mg Intravenous Stopped 01/21/17 2238)  sodium chloride 0.9 % bolus 1,000 mL (1,000 mLs Intravenous New Bag/Given 01/21/17 2221)    And  sodium chloride 0.9 % bolus 1,000 mL (1,000 mLs Intravenous New Bag/Given 01/21/17 2221)    And  sodium chloride 0.9 % bolus 1,000 mL (1,000 mLs Intravenous New Bag/Given 01/21/17 2220)     Initial Impression / Assessment and Plan / ED Course  I have reviewed the triage vital signs and the nursing notes.  Pertinent labs &  imaging results that were available during my care of the patient were reviewed by me and considered in my medical decision making (see chart for details).   patient with recent admission to the hospital from February 7 until March 17 following subdurals. Patient brought in by family for 3 days of worsening generalized weakness and some concern for urinary tract infection.  Patient was febrile here. Initially blood pressures were fine but after going to CT scan patient had blood pressures in the 70s. Responded to about 1 L fluid. Systolic pressures now up to 103. Patient started on sepsis protocol. Was supposed to get 3 L of fluid but after 1 L was stopped since pressures were better. Chest x-ray raises concerns for pneumonia CT of chest raises additional concerns for left upper and right lower opacities that could be infectious. Urinalysis also had many bacteria. Nitrite was negative. Patient treated with broad-spectrum antibiotics.  Patients sepsis picture could be secondary to the pneumonia or could be related to the urinary tract infection.  Head CT without any significant changes in the subdurals.  Following the fluids patient more alert and feeling better.  Patient was never started on pressor agents.  Lactic acid was elevated but less than 2 and certainly less than 4. Patient had blood cultures and urine culture.      Final Clinical Impressions(s) / ED Diagnoses   Final diagnoses:  Sepsis, due to unspecified organism (HCC)  HCAP (healthcare-associated pneumonia)  Other specified hypotension    New Prescriptions New Prescriptions   No medications on file     Vanetta Mulders, MD 01/21/17 [phone]

## 2017-01-21 NOTE — ED Triage Notes (Addendum)
Pt to ER by [facility] from home where wife activated EMS for 3 days worsening generalized weakness. Pt recently d/c from hospital after traumatic subdural bleed, pt has residual left sided deficits. Pt is alert/oriented, said to be his normal per EMS. EMS reports odor of urine. Initial BP reading 88/40, received a 250 bolus and BP normotensive on arrival per paramedic.

## 2017-01-21 NOTE — ED Notes (Signed)
Patient transported to CT 

## 2017-01-21 NOTE — Progress Notes (Signed)
Pharmacy Antibiotic Note  Alex DuhamelJohn R [name] is a 83 y.o. male admitted on 01/21/2017 with sepsis.  Pharmacy has been consulted for vancomycin and zosyn dosing. TMax is 101.8 and WBC is WNL. SCr is 0.91 and lactic acid is 1.98.   Plan: Vanc 1750mg  IV x 1 then 1gm IV Q12H Zosyn 3.375gm IV Q8H (4 hr inf) F/u renal fxn, C&S, clinical status and trough at SS    Temp (24hrs), Avg:100.7 F (38.2 C), Min:99.5 F (37.5 C), Max:101.8 F (38.8 C)   Recent Labs Lab 01/21/17 1845 01/21/17 1853  WBC 7.4  --   CREATININE 0.91  --   LATICACIDVEN  --  1.98*    Estimated Creatinine Clearance: 70.3 mL/min (by C-G formula based on SCr of 0.91 mg/dL).    No Known Allergies  Antimicrobials this admission: Vanc 3/28>> Zosyn 3/28>>  Dose adjustments this admission: N/A  Microbiology results: Pending  Thank you for allowing pharmacy to be a part of this patient's care.  Alex [name], Alex [name] 01/21/2017 8:02 PM

## 2017-01-21 NOTE — Telephone Encounter (Signed)
Patients wife called, states patient has had major turn for worse since discharge from hospital, states he is fatigued and sleeps 24/7, lost huge amount of energy and is becoming dead weight during PT sessions  After consulting with Dr. Riley KillSwartz given how the patient was doing at discharge until now he highly recomends he be seen immediately by a physican weather it be his primary or by ER, patients wife stated he did have an appointment today by his primary but was unable to get him out of the house due to his fatigue and shaking.  All information relayed to patients wife and she stated is calling ambulance to have him transported to the ER

## 2017-01-21 NOTE — ED Notes (Signed)
Patient transported to X-ray 

## 2017-01-21 NOTE — ED Notes (Signed)
PO fluids given

## 2017-01-22 DIAGNOSIS — E782 Mixed hyperlipidemia: Secondary | ICD-10-CM | POA: Diagnosis present

## 2017-01-22 DIAGNOSIS — Z950 Presence of cardiac pacemaker: Secondary | ICD-10-CM | POA: Diagnosis not present

## 2017-01-22 DIAGNOSIS — Y95 Nosocomial condition: Secondary | ICD-10-CM | POA: Diagnosis present

## 2017-01-22 DIAGNOSIS — J449 Chronic obstructive pulmonary disease, unspecified: Secondary | ICD-10-CM | POA: Diagnosis present

## 2017-01-22 DIAGNOSIS — J690 Pneumonitis due to inhalation of food and vomit: Secondary | ICD-10-CM

## 2017-01-22 DIAGNOSIS — R7881 Bacteremia: Secondary | ICD-10-CM | POA: Diagnosis not present

## 2017-01-22 DIAGNOSIS — R609 Edema, unspecified: Secondary | ICD-10-CM | POA: Diagnosis not present

## 2017-01-22 DIAGNOSIS — S069X2D Unspecified intracranial injury with loss of consciousness of 31 minutes to 59 minutes, subsequent encounter: Secondary | ICD-10-CM | POA: Diagnosis not present

## 2017-01-22 DIAGNOSIS — I2510 Atherosclerotic heart disease of native coronary artery without angina pectoris: Secondary | ICD-10-CM | POA: Diagnosis present

## 2017-01-22 DIAGNOSIS — I951 Orthostatic hypotension: Secondary | ICD-10-CM | POA: Diagnosis not present

## 2017-01-22 DIAGNOSIS — Z87828 Personal history of other (healed) physical injury and trauma: Secondary | ICD-10-CM | POA: Diagnosis not present

## 2017-01-22 DIAGNOSIS — Z6830 Body mass index (BMI) 30.0-30.9, adult: Secondary | ICD-10-CM | POA: Diagnosis not present

## 2017-01-22 DIAGNOSIS — I5033 Acute on chronic diastolic (congestive) heart failure: Secondary | ICD-10-CM | POA: Diagnosis not present

## 2017-01-22 DIAGNOSIS — D649 Anemia, unspecified: Secondary | ICD-10-CM | POA: Diagnosis present

## 2017-01-22 DIAGNOSIS — Z952 Presence of prosthetic heart valve: Secondary | ICD-10-CM | POA: Diagnosis not present

## 2017-01-22 DIAGNOSIS — I481 Persistent atrial fibrillation: Secondary | ICD-10-CM | POA: Diagnosis present

## 2017-01-22 DIAGNOSIS — B9620 Unspecified Escherichia coli [E. coli] as the cause of diseases classified elsewhere: Secondary | ICD-10-CM | POA: Diagnosis present

## 2017-01-22 DIAGNOSIS — Z6829 Body mass index (BMI) 29.0-29.9, adult: Secondary | ICD-10-CM | POA: Diagnosis not present

## 2017-01-22 DIAGNOSIS — D62 Acute posthemorrhagic anemia: Secondary | ICD-10-CM | POA: Diagnosis not present

## 2017-01-22 DIAGNOSIS — A419 Sepsis, unspecified organism: Secondary | ICD-10-CM | POA: Diagnosis not present

## 2017-01-22 DIAGNOSIS — F419 Anxiety disorder, unspecified: Secondary | ICD-10-CM | POA: Diagnosis present

## 2017-01-22 DIAGNOSIS — I480 Paroxysmal atrial fibrillation: Secondary | ICD-10-CM | POA: Diagnosis not present

## 2017-01-22 DIAGNOSIS — G4733 Obstructive sleep apnea (adult) (pediatric): Secondary | ICD-10-CM | POA: Diagnosis present

## 2017-01-22 DIAGNOSIS — I952 Hypotension due to drugs: Secondary | ICD-10-CM | POA: Diagnosis not present

## 2017-01-22 DIAGNOSIS — J42 Unspecified chronic bronchitis: Secondary | ICD-10-CM | POA: Diagnosis not present

## 2017-01-22 DIAGNOSIS — G40909 Epilepsy, unspecified, not intractable, without status epilepticus: Secondary | ICD-10-CM | POA: Diagnosis present

## 2017-01-22 DIAGNOSIS — N138 Other obstructive and reflux uropathy: Secondary | ICD-10-CM | POA: Diagnosis present

## 2017-01-22 DIAGNOSIS — I4891 Unspecified atrial fibrillation: Secondary | ICD-10-CM | POA: Diagnosis not present

## 2017-01-22 DIAGNOSIS — Z515 Encounter for palliative care: Secondary | ICD-10-CM | POA: Diagnosis not present

## 2017-01-22 DIAGNOSIS — I5032 Chronic diastolic (congestive) heart failure: Secondary | ICD-10-CM | POA: Diagnosis present

## 2017-01-22 DIAGNOSIS — N412 Abscess of prostate: Secondary | ICD-10-CM | POA: Diagnosis present

## 2017-01-22 DIAGNOSIS — Z298 Encounter for other specified prophylactic measures: Secondary | ICD-10-CM | POA: Diagnosis not present

## 2017-01-22 DIAGNOSIS — J440 Chronic obstructive pulmonary disease with acute lower respiratory infection: Secondary | ICD-10-CM | POA: Diagnosis present

## 2017-01-22 DIAGNOSIS — I6200 Nontraumatic subdural hemorrhage, unspecified: Secondary | ICD-10-CM | POA: Diagnosis not present

## 2017-01-22 DIAGNOSIS — Z831 Family history of other infectious and parasitic diseases: Secondary | ICD-10-CM | POA: Diagnosis not present

## 2017-01-22 DIAGNOSIS — M79609 Pain in unspecified limb: Secondary | ICD-10-CM | POA: Diagnosis not present

## 2017-01-22 DIAGNOSIS — J189 Pneumonia, unspecified organism: Secondary | ICD-10-CM | POA: Diagnosis not present

## 2017-01-22 DIAGNOSIS — R7989 Other specified abnormal findings of blood chemistry: Secondary | ICD-10-CM | POA: Diagnosis not present

## 2017-01-22 DIAGNOSIS — F329 Major depressive disorder, single episode, unspecified: Secondary | ICD-10-CM | POA: Diagnosis present

## 2017-01-22 DIAGNOSIS — Z66 Do not resuscitate: Secondary | ICD-10-CM | POA: Diagnosis present

## 2017-01-22 DIAGNOSIS — I482 Chronic atrial fibrillation: Secondary | ICD-10-CM | POA: Diagnosis present

## 2017-01-22 DIAGNOSIS — Z9889 Other specified postprocedural states: Secondary | ICD-10-CM | POA: Diagnosis not present

## 2017-01-22 DIAGNOSIS — I82622 Acute embolism and thrombosis of deep veins of left upper extremity: Secondary | ICD-10-CM | POA: Diagnosis not present

## 2017-01-22 DIAGNOSIS — R509 Fever, unspecified: Secondary | ICD-10-CM | POA: Diagnosis not present

## 2017-01-22 DIAGNOSIS — I959 Hypotension, unspecified: Secondary | ICD-10-CM | POA: Diagnosis present

## 2017-01-22 DIAGNOSIS — M7989 Other specified soft tissue disorders: Secondary | ICD-10-CM | POA: Diagnosis not present

## 2017-01-22 DIAGNOSIS — B961 Klebsiella pneumoniae [K. pneumoniae] as the cause of diseases classified elsewhere: Secondary | ICD-10-CM | POA: Diagnosis present

## 2017-01-22 DIAGNOSIS — F09 Unspecified mental disorder due to known physiological condition: Secondary | ICD-10-CM | POA: Diagnosis not present

## 2017-01-22 DIAGNOSIS — E785 Hyperlipidemia, unspecified: Secondary | ICD-10-CM | POA: Diagnosis not present

## 2017-01-22 DIAGNOSIS — Z7189 Other specified counseling: Secondary | ICD-10-CM | POA: Diagnosis not present

## 2017-01-22 DIAGNOSIS — R531 Weakness: Secondary | ICD-10-CM | POA: Diagnosis present

## 2017-01-22 DIAGNOSIS — I9589 Other hypotension: Secondary | ICD-10-CM | POA: Diagnosis not present

## 2017-01-22 DIAGNOSIS — Z9981 Dependence on supplemental oxygen: Secondary | ICD-10-CM | POA: Diagnosis not present

## 2017-01-22 DIAGNOSIS — I110 Hypertensive heart disease with heart failure: Secondary | ICD-10-CM | POA: Diagnosis present

## 2017-01-22 DIAGNOSIS — R5381 Other malaise: Secondary | ICD-10-CM | POA: Diagnosis not present

## 2017-01-22 DIAGNOSIS — Z87891 Personal history of nicotine dependence: Secondary | ICD-10-CM | POA: Diagnosis not present

## 2017-01-22 DIAGNOSIS — S06341A Traumatic hemorrhage of right cerebrum with loss of consciousness of 30 minutes or less, initial encounter: Secondary | ICD-10-CM | POA: Diagnosis not present

## 2017-01-22 DIAGNOSIS — A4159 Other Gram-negative sepsis: Secondary | ICD-10-CM | POA: Diagnosis present

## 2017-01-22 DIAGNOSIS — F411 Generalized anxiety disorder: Secondary | ICD-10-CM | POA: Diagnosis not present

## 2017-01-22 DIAGNOSIS — N390 Urinary tract infection, site not specified: Secondary | ICD-10-CM | POA: Diagnosis present

## 2017-01-22 DIAGNOSIS — Z8679 Personal history of other diseases of the circulatory system: Secondary | ICD-10-CM | POA: Diagnosis not present

## 2017-01-22 DIAGNOSIS — Z820 Family history of epilepsy and other diseases of the nervous system: Secondary | ICD-10-CM | POA: Diagnosis not present

## 2017-01-22 LAB — RESPIRATORY PANEL BY PCR
ADENOVIRUS-RVPPCR: NOT DETECTED
Bordetella pertussis: NOT DETECTED
CORONAVIRUS 229E-RVPPCR: NOT DETECTED
CORONAVIRUS HKU1-RVPPCR: NOT DETECTED
CORONAVIRUS NL63-RVPPCR: NOT DETECTED
CORONAVIRUS OC43-RVPPCR: NOT DETECTED
Chlamydophila pneumoniae: NOT DETECTED
Influenza A: NOT DETECTED
Influenza B: NOT DETECTED
METAPNEUMOVIRUS-RVPPCR: NOT DETECTED
MYCOPLASMA PNEUMONIAE-RVPPCR: NOT DETECTED
PARAINFLUENZA VIRUS 1-RVPPCR: NOT DETECTED
PARAINFLUENZA VIRUS 2-RVPPCR: NOT DETECTED
Parainfluenza Virus 3: NOT DETECTED
Parainfluenza Virus 4: NOT DETECTED
RESPIRATORY SYNCYTIAL VIRUS-RVPPCR: NOT DETECTED
Rhinovirus / Enterovirus: NOT DETECTED

## 2017-01-22 LAB — CBC
HCT: 32.1 % — ABNORMAL LOW (ref 39.0–52.0)
Hemoglobin: 10.3 g/dL — ABNORMAL LOW (ref 13.0–17.0)
MCH: 31.3 pg (ref 26.0–34.0)
MCHC: 32.1 g/dL (ref 30.0–36.0)
MCV: 97.6 fL (ref 78.0–100.0)
Platelets: 164 10*3/uL (ref 150–400)
RBC: 3.29 MIL/uL — ABNORMAL LOW (ref 4.22–5.81)
RDW: 15.3 % (ref 11.5–15.5)
WBC: 6.9 10*3/uL (ref 4.0–10.5)

## 2017-01-22 LAB — COMPREHENSIVE METABOLIC PANEL
ALT: 87 U/L — ABNORMAL HIGH (ref 17–63)
ANION GAP: 9 (ref 5–15)
AST: 77 U/L — AB (ref 15–41)
Albumin: 1.6 g/dL — ABNORMAL LOW (ref 3.5–5.0)
Alkaline Phosphatase: 94 U/L (ref 38–126)
BUN: 17 mg/dL (ref 6–20)
CO2: 28 mmol/L (ref 22–32)
Calcium: 7.6 mg/dL — ABNORMAL LOW (ref 8.9–10.3)
Chloride: 103 mmol/L (ref 101–111)
Creatinine, Ser: 0.94 mg/dL (ref 0.61–1.24)
GFR calc Af Amer: >60 mL/min (ref >60)
GFR calc non Af Amer: >60 mL/min (ref >60)
GLUCOSE: 99 mg/dL (ref 65–99)
POTASSIUM: 4.8 mmol/L (ref 3.5–5.1)
Sodium: 140 mmol/L (ref 135–145)
TOTAL PROTEIN: 4.9 g/dL — AB (ref 6.5–8.1)
Total Bilirubin: 0.8 mg/dL (ref 0.3–1.2)

## 2017-01-22 LAB — BLOOD CULTURE ID PANEL (REFLEXED)
ACINETOBACTER BAUMANNII: NOT DETECTED
CANDIDA ALBICANS: NOT DETECTED
CANDIDA GLABRATA: NOT DETECTED
CANDIDA KRUSEI: NOT DETECTED
Candida parapsilosis: NOT DETECTED
Candida tropicalis: NOT DETECTED
Carbapenem resistance: NOT DETECTED
ENTEROBACTER CLOACAE COMPLEX: NOT DETECTED
ENTEROCOCCUS SPECIES: NOT DETECTED
ESCHERICHIA COLI: NOT DETECTED
Enterobacteriaceae species: DETECTED — AB
Haemophilus influenzae: NOT DETECTED
KLEBSIELLA OXYTOCA: NOT DETECTED
Klebsiella pneumoniae: DETECTED — AB
LISTERIA MONOCYTOGENES: NOT DETECTED
NEISSERIA MENINGITIDIS: NOT DETECTED
Proteus species: NOT DETECTED
Pseudomonas aeruginosa: NOT DETECTED
STREPTOCOCCUS AGALACTIAE: NOT DETECTED
STREPTOCOCCUS PNEUMONIAE: NOT DETECTED
STREPTOCOCCUS PYOGENES: NOT DETECTED
Serratia marcescens: NOT DETECTED
Staphylococcus aureus (BCID): NOT DETECTED
Staphylococcus species: NOT DETECTED
Streptococcus species: NOT DETECTED

## 2017-01-22 LAB — CG4 I-STAT (LACTIC ACID)
Lactic Acid, Venous: 1.87 mmol/L (ref 0.5–1.9)
Lactic Acid, Venous: 1.67 mmol/L (ref 0.5–1.9)

## 2017-01-22 LAB — TROPONIN I

## 2017-01-22 LAB — LACTIC ACID, PLASMA
Lactic Acid, Venous: 1.7 mmol/L (ref 0.5–1.9)
Lactic Acid, Venous: 1.4 mmol/L (ref 0.5–1.9)

## 2017-01-22 LAB — PROCALCITONIN: PROCALCITONIN: 0.18 ng/mL

## 2017-01-22 MED ORDER — ORAL CARE MOUTH RINSE
15.0000 mL | Freq: Two times a day (BID) | OROMUCOSAL | Status: DC
  Administered 2017-01-22 – 2017-01-31 (×16): 15 mL via OROMUCOSAL

## 2017-01-22 MED ORDER — METOPROLOL SUCCINATE ER 25 MG PO TB24
12.5000 mg | ORAL_TABLET | Freq: Every day | ORAL | Status: DC
  Administered 2017-01-22 – 2017-01-29 (×6): 12.5 mg via ORAL
  Filled 2017-01-22 (×12): qty 1

## 2017-01-22 MED ORDER — ATORVASTATIN CALCIUM 40 MG PO TABS
40.0000 mg | ORAL_TABLET | Freq: Every day | ORAL | Status: DC
  Administered 2017-01-22 – 2017-01-31 (×9): 40 mg via ORAL
  Filled 2017-01-22 (×9): qty 1

## 2017-01-22 MED ORDER — DEXTROSE 5 % IV SOLN
2.0000 g | INTRAVENOUS | Status: DC
  Administered 2017-01-22 – 2017-01-29 (×8): 2 g via INTRAVENOUS
  Filled 2017-01-22 (×11): qty 2

## 2017-01-22 MED ORDER — ACETAMINOPHEN 325 MG PO TABS
650.0000 mg | ORAL_TABLET | ORAL | Status: AC
  Administered 2017-01-22: 650 mg via ORAL
  Filled 2017-01-22: qty 2

## 2017-01-22 MED ORDER — LEVETIRACETAM 500 MG PO TABS
500.0000 mg | ORAL_TABLET | Freq: Two times a day (BID) | ORAL | Status: DC
  Administered 2017-01-22 – 2017-01-31 (×20): 500 mg via ORAL
  Filled 2017-01-22 (×20): qty 1

## 2017-01-22 MED ORDER — SODIUM CHLORIDE 0.9 % IV SOLN
INTRAVENOUS | Status: DC
  Administered 2017-01-22 – 2017-01-28 (×8): via INTRAVENOUS

## 2017-01-22 MED ORDER — MAGNESIUM OXIDE 400 (241.3 MG) MG PO TABS
200.0000 mg | ORAL_TABLET | Freq: Every day | ORAL | Status: DC
Start: 2017-01-22 — End: 2017-02-01
  Administered 2017-01-22 – 2017-01-31 (×9): 200 mg via ORAL
  Filled 2017-01-22 (×9): qty 1

## 2017-01-22 MED ORDER — ASPIRIN 81 MG PO CHEW
81.0000 mg | CHEWABLE_TABLET | Freq: Every day | ORAL | Status: DC
  Administered 2017-01-22 – 2017-01-31 (×9): 81 mg via ORAL
  Filled 2017-01-22 (×9): qty 1

## 2017-01-22 MED ORDER — AMIODARONE HCL 200 MG PO TABS
200.0000 mg | ORAL_TABLET | Freq: Two times a day (BID) | ORAL | Status: DC
  Administered 2017-01-22 – 2017-01-31 (×20): 200 mg via ORAL
  Filled 2017-01-22 (×21): qty 1

## 2017-01-22 MED ORDER — ACETAMINOPHEN 325 MG PO TABS
650.0000 mg | ORAL_TABLET | Freq: Four times a day (QID) | ORAL | Status: DC | PRN
  Administered 2017-01-23 – 2017-01-26 (×4): 650 mg via ORAL
  Filled 2017-01-22 (×4): qty 2

## 2017-01-22 MED ORDER — ESCITALOPRAM OXALATE 10 MG PO TABS
10.0000 mg | ORAL_TABLET | Freq: Every day | ORAL | Status: DC
  Administered 2017-01-22 – 2017-01-31 (×11): 10 mg via ORAL
  Filled 2017-01-22 (×11): qty 1

## 2017-01-22 MED ORDER — ALPRAZOLAM 0.25 MG PO TABS
0.2500 mg | ORAL_TABLET | Freq: Every day | ORAL | Status: DC
  Administered 2017-01-22 – 2017-01-31 (×11): 0.25 mg via ORAL
  Filled 2017-01-22 (×11): qty 1

## 2017-01-22 MED ORDER — CALCIUM GLUCONATE 10 % IV SOLN
1.0000 g | Freq: Once | INTRAVENOUS | Status: AC
  Administered 2017-01-22: 1 g via INTRAVENOUS
  Filled 2017-01-22: qty 10

## 2017-01-22 NOTE — Progress Notes (Signed)
Patient arrived to unit with wife at bedside (wife got patient settled and went home). Vitals stable, no complaints of pain, tele applied and verified.  Admission completed.  Continue to monitor patient.

## 2017-01-22 NOTE — Care Management Note (Signed)
Case Management Note  Patient Details  Name: Jetty DuhamelJohn R Scheidt MRN: [id number] Date of Birth: 1931-08-31  Subjective/Objective:                 Patient was admitted with weakness due to infection. He was recently hospitalized in 11/2016 with subdural hematoma requiring craniotomy.  He has been home with his wife/24 hour care approximately 1.5 weeks.  CM will follow for discharge needs pending patient's progress and physician orders.  Action/Plan:   Expected Discharge Date:                  Expected Discharge Plan:     In-House Referral:     Discharge planning Services     Post Acute Care Choice:    Choice offered to:     DME Arranged:    DME Agency:     HH Arranged:    HH Agency:     Status of Service:     If discussed at [employer] Length of Stay Meetings, dates discussed:    Additional Comments:  Anda KraftRobarge, Jaelynn Currier C, RN 01/22/2017, 10:02 AM

## 2017-01-22 NOTE — Progress Notes (Signed)
  PHARMACY - PHYSICIAN COMMUNICATION CRITICAL VALUE ALERT - BLOOD CULTURE IDENTIFICATION (BCID)  Results for orders placed or performed during the hospital encounter of 01/21/17  Blood Culture ID Panel (Reflexed) (Collected: 01/21/2017  8:20 PM)  Result Value Ref Range   Enterococcus species NOT DETECTED NOT DETECTED   Listeria monocytogenes NOT DETECTED NOT DETECTED   Staphylococcus species NOT DETECTED NOT DETECTED   Staphylococcus aureus NOT DETECTED NOT DETECTED   Streptococcus species NOT DETECTED NOT DETECTED   Streptococcus agalactiae NOT DETECTED NOT DETECTED   Streptococcus pneumoniae NOT DETECTED NOT DETECTED   Streptococcus pyogenes NOT DETECTED NOT DETECTED   Acinetobacter baumannii NOT DETECTED NOT DETECTED   Enterobacteriaceae species DETECTED (A) NOT DETECTED   Enterobacter cloacae complex NOT DETECTED NOT DETECTED   Escherichia coli NOT DETECTED NOT DETECTED   Klebsiella oxytoca NOT DETECTED NOT DETECTED   Klebsiella pneumoniae DETECTED (A) NOT DETECTED   Proteus species NOT DETECTED NOT DETECTED   Serratia marcescens NOT DETECTED NOT DETECTED   Carbapenem resistance NOT DETECTED NOT DETECTED   Haemophilus influenzae NOT DETECTED NOT DETECTED   Neisseria meningitidis NOT DETECTED NOT DETECTED   Pseudomonas aeruginosa NOT DETECTED NOT DETECTED   Candida albicans NOT DETECTED NOT DETECTED   Candida glabrata NOT DETECTED NOT DETECTED   Candida krusei NOT DETECTED NOT DETECTED   Candida parapsilosis NOT DETECTED NOT DETECTED   Candida tropicalis NOT DETECTED NOT DETECTED    Name of physician (or Provider) ContactedElisabeth Pigeon: Devine  Changes to prescribed antibiotics required: D/c Vancomycin & Zosyn >> narrow to Rocephin 2g IV every 24 hours  Rolley SimsMartin, Abe Schools Ann 01/22/2017  2:21 PM

## 2017-01-22 NOTE — Progress Notes (Addendum)
Patient ID: Alex [name], male   DOB: 05-01-1931, 83 y.o.   MRN: [id number]  PROGRESS NOTE    Alex [name]  [id number]:[id number] DOB: 10/20/1931 DOA: 01/21/2017  PCP: Derrill Center, MD   Brief Narrative:  83 y.o. male with medical history significant for COPD, A. Fib, CAD s/p CABG, OSA on oxygen at night, s/p PPM, s/p AVR, morbid obesity. He was recently hospitalized in 11/2016 for traumatic subdural hematoma requiring craniotomy. He had prolonged rehabilitation stay for which he was just recently discharged home with 24-hour nursing care 1-1/2 weeks ago PTA. Patient has chronic atrial fibrillation and had an unsuccessful trial of cardioversion on 1/25 with Dr. Harrington Challenger. Following the subdural hematoma patient was taken off of Coumadin and has only been on a baby aspirin daily.   Pt presented to [facility] with generalized weakness for past 3 days prior to this admission. He also was more confused especially in the evenings. Pt reported having subjective fevers, decreased appetite.  In ED, pt had fever of 101.45F, pulse rate 38-81, respirations 17-28, blood pressure as low as 80/42, and O2 sats maintained above 90% on 2 L. Labs revealed normal WBC, hemoglobin 10.7, albumin 1.7, AST 95, ALT 97, lactic acid 1.98, and all other labs relatively unremarkable. Urinalysis showed trace leukocytes. CXR showed bibasilar infiltrates, possible aspiration. No acute findings on CT head. He was started on empiric abx for UTI and pneumonia.  Assessment & Plan:   Principal Problem:   Sepsis secondary to bibasilar pneumonia, possible aspiration pneumonitis / UTI ([place]) - Sepsis criteria met on admission with fever, tachypnea, tachycardia, hypotension. Source of infection is pneumonia and UTI - Blood and urine cultures pending - Resp virus panel pending - Sputum cx pending  - Continue vanco and zosyn   Active Problems:   Persistent atrial fibrillation (HCC) - CHADS vasc score at least 3 - Not on coumadin due to subdural  hematoma; he is on aspirin 81 mg daily - Rate controlled with metoprolol and amiodarone       Essential hypertension - Continue metoprolol     Dyslipidemia - Continue Lipitor     Depression  - Continue Lexapro     Seizure disorder - Continue Keppra  - Continue xanax at bedtime     DVT prophylaxis: SCD's bilaterally  Code Status: DNR/DNI Family Communication: no family at the bedside this am Disposition Plan: home by 4/1   Consultants:   None   Procedures:   None   Antimicrobials:   Vanco and zosyn 3/28 -->   Subjective: No overnight events.   Objective: Vitals:   01/22/17 0130 01/22/17 0159 01/22/17 0523 01/22/17 0600  BP: 133/73 130/67 (!) 96/57 98/62  Pulse: 77 75 61   Resp: (!) 22 20 20    Temp:  97.9 F (36.6 C) 97.9 F (36.6 C)   TempSrc:  Oral Oral   SpO2: 99% 96% 95%   Weight:  97.5 kg (215 lb)    Height:  6' 2"  (1.88 m)      Intake/Output Summary (Last 24 hours) at 01/22/17 1036 Last data filed at 01/22/17 0200  Gross per 24 hour  Intake             2300 ml  Output                0 ml  Net             2300 ml   Filed Weights   01/21/17 2033 01/22/17 0159  Weight: 91.6 kg (202 lb) 97.5 kg (215 lb)    Examination:  General exam: Appears calm and comfortable  Respiratory system: rhonchi at bases, no wheezing  Cardiovascular system: S1 & S2 heard, Rate controlled  Gastrointestinal system: Abdomen is nondistended, soft and nontender. No organomegaly or masses felt. Normal bowel sounds heard. Central nervous system: Alert and oriented. No focal neurological deficits. Extremities: Symmetric 5 x 5 power. Skin: No rashes, lesions or ulcers Psychiatry: Judgement and insight appear normal. Mood & affect appropriate.   Data Reviewed: I have personally reviewed following labs and imaging studies  CBC:  Recent Labs Lab 01/21/17 1845 01/22/17 0639  WBC 7.4 6.9  NEUTROABS 5.3  --   HGB 10.7* 10.3*  HCT 33.2* 32.1*  MCV 97.1 97.6  PLT  188 564   Basic Metabolic Panel:  Recent Labs Lab 01/21/17 1845 01/22/17 0243  NA 137 140  K 4.6 4.8  CL 99* 103  CO2 28 28  GLUCOSE 110* 99  BUN 19 17  CREATININE 0.91 0.94  CALCIUM 8.0* 7.6*   GFR: Estimated Creatinine Clearance: 68 mL/min (by C-G formula based on SCr of 0.94 mg/dL). Liver Function Tests:  Recent Labs Lab 01/21/17 1845 01/22/17 0243  AST 95* 77*  ALT 97* 87*  ALKPHOS 103 94  BILITOT 0.6 0.8  PROT 5.1* 4.9*  ALBUMIN 1.7* 1.6*   No results for input(s): LIPASE, AMYLASE in the last 168 hours. No results for input(s): AMMONIA in the last 168 hours. Coagulation Profile:  Recent Labs Lab 01/21/17 1845  INR 1.32   Cardiac Enzymes:  Recent Labs Lab 01/22/17 0243 01/22/17 0639  TROPONINI <0.03 <0.03   BNP (last 3 results) No results for input(s): PROBNP in the last 8760 hours. HbA1C: No results for input(s): HGBA1C in the last 72 hours. CBG: No results for input(s): GLUCAP in the last 168 hours. Lipid Profile: No results for input(s): CHOL, HDL, LDLCALC, TRIG, CHOLHDL, LDLDIRECT in the last 72 hours. Thyroid Function Tests: No results for input(s): TSH, T4TOTAL, FREET4, T3FREE, THYROIDAB in the last 72 hours. Anemia Panel: No results for input(s): VITAMINB12, FOLATE, FERRITIN, TIBC, IRON, RETICCTPCT in the last 72 hours. Urine analysis:    Component Value Date/Time   COLORURINE AMBER (A) 01/21/2017 2031   APPEARANCEUR HAZY (A) 01/21/2017 2031   LABSPEC 1.015 01/21/2017 2031   PHURINE 6.0 01/21/2017 2031   GLUCOSEU NEGATIVE 01/21/2017 2031   HGBUR NEGATIVE 01/21/2017 2031   BILIRUBINUR NEGATIVE 01/21/2017 2031   [place] NEGATIVE 01/21/2017 2031   PROTEINUR 30 (A) 01/21/2017 2031   NITRITE NEGATIVE 01/21/2017 2031   LEUKOCYTESUR TRACE (A) 01/21/2017 2031   Sepsis Labs: '@LABRCNTIP'$ (procalcitonin:4,lacticidven:4)   )No results found for this or any previous visit (from the past 240 hour(s)).    Radiology Studies: Dg Chest 2  View Result Date: 01/21/2017 Suspect bibasilar infiltrates/pneumonia.  Possible aspiration.   Ct Head Wo Contrast Result Date: 01/21/2017 1. Some residual chronic subdural fluid is seen overlying the right cerebral convexity though decreased since prior, now measuring up to 6 mm in thickness versus 14 mm previously. There has been some interval development of right-sided dural thickening which can be seen in this setting since prior. 2. Resolution of pneumocephalus. 3. Interval decrease in right to left midline shift since prior. 4. No acute nor new findings since previous exam. Chronic small vessel ischemic disease of periventricular white matter.   Ct Chest Wo Contrast Result Date: 01/21/2017 1. 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  Subpleural right lower lobe and intraparenchymal left upper lobe ill-defined opacities are seen which may reflect more confluent areas of pneumonia, atelectasis or postinflammatory change. As neoplasm cannot be entirely excluded, recommend followup short term to assure improvement or resolution. 2. Aortic atherosclerosis.  Status post CABG. Electronically Signed   By: Ashley Royalty M.D.   On: 01/21/2017 21:34      Scheduled Meds: . ALPRAZolam  0.25 mg Oral QHS  . amiodarone  200 mg Oral BID  . aspirin  81 mg Oral Daily  . atorvastatin  40 mg Oral Daily  . escitalopram  10 mg Oral QHS  . levETIRAcetam  500 mg Oral BID  . magnesium oxide  200 mg Oral Daily  . metoprolol succinate  12.5 mg Oral Daily  . piperacillin-tazobactam (ZOSYN)  IV  3.375 g Intravenous Q8H  . vancomycin  1,000 mg Intravenous Q12H   Continuous Infusions: . sodium chloride 75 mL/hr at 01/22/17 0858     LOS: 0 days    Time spent: 25 minutes  Greater than 50% of the time spent on counseling and coordinating the care.   Leisa Lenz, MD [facility] Pager [phone]  If 7PM-7AM, please contact night-coverage [email or web address] Password  [facility] 01/22/2017, 10:36 AM

## 2017-01-22 NOTE — ED Notes (Signed)
Report attempted 

## 2017-01-23 DIAGNOSIS — I4891 Unspecified atrial fibrillation: Secondary | ICD-10-CM

## 2017-01-23 DIAGNOSIS — B9620 Unspecified Escherichia coli [E. coli] as the cause of diseases classified elsewhere: Secondary | ICD-10-CM

## 2017-01-23 DIAGNOSIS — R7881 Bacteremia: Secondary | ICD-10-CM

## 2017-01-23 DIAGNOSIS — B961 Klebsiella pneumoniae [K. pneumoniae] as the cause of diseases classified elsewhere: Secondary | ICD-10-CM

## 2017-01-23 LAB — CBC
HCT: 32.8 % — ABNORMAL LOW (ref 39.0–52.0)
Hemoglobin: 10.6 g/dL — ABNORMAL LOW (ref 13.0–17.0)
MCH: 31.1 pg (ref 26.0–34.0)
MCHC: 32.3 g/dL (ref 30.0–36.0)
MCV: 96.2 fL (ref 78.0–100.0)
PLATELETS: 189 10*3/uL (ref 150–400)
RBC: 3.41 MIL/uL — AB (ref 4.22–5.81)
RDW: 15.1 % (ref 11.5–15.5)
WBC: 8.2 10*3/uL (ref 4.0–10.5)

## 2017-01-23 LAB — BASIC METABOLIC PANEL
Anion gap: 9 (ref 5–15)
BUN: 17 mg/dL (ref 6–20)
CO2: 26 mmol/L (ref 22–32)
CREATININE: 0.86 mg/dL (ref 0.61–1.24)
Calcium: 7.7 mg/dL — ABNORMAL LOW (ref 8.9–10.3)
Chloride: 103 mmol/L (ref 101–111)
GFR calc Af Amer: >60 mL/min (ref >60)
GFR calc non Af Amer: >60 mL/min (ref >60)
Glucose, Bld: 102 mg/dL — ABNORMAL HIGH (ref 65–99)
POTASSIUM: 3.9 mmol/L (ref 3.5–5.1)
SODIUM: 138 mmol/L (ref 135–145)

## 2017-01-23 NOTE — Progress Notes (Addendum)
Inpatient Rehabilitation  Per Pt request, patient was screened by Fae Pippin for appropriateness for an Inpatient Acute Rehab consult.  Patient is known to our service and was recently discharged home.  At this time we are recommending an Inpatient Rehab consult as well as OT evaluation orders to obtain insurance authorization.  MD text paged; please order if you are agreeable.    Charlane Ferretti., CCC/SLP Admission Coordinator  [facility] Inpatient Rehabilitation  Cell [phone]

## 2017-01-23 NOTE — Progress Notes (Signed)
Patient ID: Alex [name], male   DOB: 12-13-1930, 83 y.o.   MRN: [id number]  PROGRESS NOTE    Alex [name]  BLT:[id number] DOB: Mar 16, 1931 DOA: 01/21/2017  PCP: Derrill Center, MD   Brief Narrative:  83 y.o. male with medical history significant for COPD, A. Fib, CAD s/p CABG, OSA on oxygen at night, s/p PPM, s/p AVR, morbid obesity. He was recently hospitalized in 11/2016 for traumatic subdural hematoma requiring craniotomy. He had prolonged rehabilitation stay for which he was just recently discharged home with 24-hour nursing care 1-1/2 weeks ago PTA. Patient has chronic atrial fibrillation and had an unsuccessful trial of cardioversion on 1/25 with Dr. Harrington Challenger. Following the subdural hematoma patient was taken off of Coumadin and has only been on a baby aspirin daily.   Pt presented to [facility] with generalized weakness for past 3 days prior to this admission. He also was more confused especially in the evenings. Pt reported having subjective fevers, decreased appetite.  In ED, pt had fever of 101.43F, pulse rate 38-81, respirations 17-28, blood pressure as low as 80/42, and O2 sats maintained above 90% on 2 L. Labs revealed normal WBC, hemoglobin 10.7, albumin 1.7, AST 95, ALT 97, lactic acid 1.98, and all other labs relatively unremarkable. Urinalysis showed trace leukocytes. CXR showed bibasilar infiltrates, possible aspiration. No acute findings on CT head. He was started on empiric abx for UTI and pneumonia.  Assessment & Plan:   Principal Problem:   Sepsis secondary to bibasilar pneumonia, possible aspiration pneumonitis / UTI secondary to E.COli and Klebsiella pneumoniae (HCC) / Klebsiella pneumoniae bacteremia  - Sepsis criteria met on admission with fever, tachypnea, tachycardia, hypotension. Source of infection is pneumonia and UTI - Urine cx grew E.Coli and Klebsiella pneumoniae - Blood cx grew Klebsiella pneumoniae - Will repet blood cx in am to ensure clearance of bacteremia  - Resp  virus panel negative  - Sputum cx pending  - Continue rocephin - Vanco and zosyn stopped yesterday 3/29 and narrowed to rocephin   Active Problems:   Persistent atrial fibrillation (HCC) - CHADS vasc score at least 3 - Not on coumadin due to subdural hematoma; he is on aspirin 81 mg daily - Rate controlled with metoprolol and amiodarone       Essential hypertension - Continue metoprolol     Dyslipidemia - Continue Lipitor     Depression  - Continue Lexapro     Seizure disorder - Continue Keppra  - Continue xanax at bedtime     DVT prophylaxis: SCD's bilaterally  Code Status: DNR/DNI Family Communication: no family at the bedside this am Disposition Plan: home by 4/1; still needs repeat blood cx in am to ensure clearance of bacteremia    Consultants:   None   Procedures:   None   Antimicrobials:   Vanco and zosyn 3/28 --> 3/29  Rocephin 3/29 -->   Subjective: No overnight events.   Objective: Vitals:   01/23/17 0103 01/23/17 0500 01/23/17 0537 01/23/17 0926  BP: 111/77  (!) 105/59 (!) 104/56  Pulse: 95  (!) 58 (!) 55  Resp: 18  18   Temp: 98.1 F (36.7 C)  97.7 F (36.5 C)   TempSrc: Oral  Oral   SpO2: 98%  98%   Weight:  101.1 kg (222 lb 12.8 oz)    Height:        Intake/Output Summary (Last 24 hours) at 01/23/17 1329 Last data filed at 01/23/17 0534  Gross per 24 hour  Intake           2087.5 ml  Output              500 ml  Net           1587.5 ml   Filed Weights   01/21/17 2033 01/22/17 0159 01/23/17 0500  Weight: 91.6 kg (202 lb) 97.5 kg (215 lb) 101.1 kg (222 lb 12.8 oz)    Examination:  General exam: Appears calm and comfortable, no distress Respiratory system: rhonchi at bases, no wheezing  Cardiovascular system: S1 & S2 heard, Rate controlled  Gastrointestinal system: (+) BS, non tender abdomen  Central nervous system: No focal neurological deficits. Extremities: No lesions, no ulcers  Skin: Skin is warm and dry Psychiatry:  Normal mood and behavior    Data Reviewed: I have personally reviewed following labs and imaging studies  CBC:  Recent Labs Lab 01/21/17 1845 01/22/17 0639 01/23/17 0303  WBC 7.4 6.9 8.2  NEUTROABS 5.3  --   --   HGB 10.7* 10.3* 10.6*  HCT 33.2* 32.1* 32.8*  MCV 97.1 97.6 96.2  PLT 188 164 638   Basic Metabolic Panel:  Recent Labs Lab 01/21/17 1845 01/22/17 0243 01/23/17 0303  NA 137 140 138  K 4.6 4.8 3.9  CL 99* 103 103  CO2 28 28 26   GLUCOSE 110* 99 102*  BUN 19 17 17   CREATININE 0.91 0.94 0.86  CALCIUM 8.0* 7.6* 7.7*   GFR: Estimated Creatinine Clearance: 81.2 mL/min (by C-G formula based on SCr of 0.86 mg/dL). Liver Function Tests:  Recent Labs Lab 01/21/17 1845 01/22/17 0243  AST 95* 77*  ALT 97* 87*  ALKPHOS 103 94  BILITOT 0.6 0.8  PROT 5.1* 4.9*  ALBUMIN 1.7* 1.6*   No results for input(s): LIPASE, AMYLASE in the last 168 hours. No results for input(s): AMMONIA in the last 168 hours. Coagulation Profile:  Recent Labs Lab 01/21/17 1845  INR 1.32   Cardiac Enzymes:  Recent Labs Lab 01/22/17 0243 01/22/17 0639  TROPONINI <0.03 <0.03   BNP (last 3 results) No results for input(s): PROBNP in the last 8760 hours. HbA1C: No results for input(s): HGBA1C in the last 72 hours. CBG: No results for input(s): GLUCAP in the last 168 hours. Lipid Profile: No results for input(s): CHOL, HDL, LDLCALC, TRIG, CHOLHDL, LDLDIRECT in the last 72 hours. Thyroid Function Tests: No results for input(s): TSH, T4TOTAL, FREET4, T3FREE, THYROIDAB in the last 72 hours. Anemia Panel: No results for input(s): VITAMINB12, FOLATE, FERRITIN, TIBC, IRON, RETICCTPCT in the last 72 hours. Urine analysis:    Component Value Date/Time   COLORURINE AMBER (A) 01/21/2017 2031   APPEARANCEUR HAZY (A) 01/21/2017 2031   LABSPEC 1.015 01/21/2017 2031   PHURINE 6.0 01/21/2017 2031   GLUCOSEU NEGATIVE 01/21/2017 2031   HGBUR NEGATIVE 01/21/2017 2031   BILIRUBINUR  NEGATIVE 01/21/2017 2031   [place] NEGATIVE 01/21/2017 2031   PROTEINUR 30 (A) 01/21/2017 2031   NITRITE NEGATIVE 01/21/2017 2031   LEUKOCYTESUR TRACE (A) 01/21/2017 2031   Sepsis Labs: @LABRCNTIP (procalcitonin:4,lacticidven:4)   ) Recent Results (from the past 240 hour(s))  Culture, blood (Routine x 2)     Status: None (Preliminary result)   Collection Time: 01/21/17  8:10 PM  Result Value Ref Range Status   Specimen Description BLOOD LEFT HAND  Final   Special Requests IN PEDIATRIC BOTTLE 4CC  Final   Culture NO GROWTH < 24 HOURS  Final   Report Status PENDING  Incomplete  Culture, blood (Routine x 2)     Status: Abnormal (Preliminary result)   Collection Time: 01/21/17  8:20 PM  Result Value Ref Range Status   Specimen Description BLOOD RIGHT ANTECUBITAL  Final   Special Requests BOTTLES DRAWN AEROBIC AND ANAEROBIC 5CC  Final   Culture  Setup Time   Final    GRAM NEGATIVE RODS AEROBIC BOTTLE ONLY CRITICAL RESULT CALLED TO, READ BACK BY AND VERIFIED WITH: E. MARTIN PHARMD, AT 5615 01/22/17 BY D. VANHOOK    Culture (A)  Final    KLEBSIELLA PNEUMONIAE SUSCEPTIBILITIES TO FOLLOW    Report Status PENDING  Incomplete  Blood Culture ID Panel (Reflexed)     Status: Abnormal   Collection Time: 01/21/17  8:20 PM  Result Value Ref Range Status   Enterococcus species NOT DETECTED NOT DETECTED Final   Listeria monocytogenes NOT DETECTED NOT DETECTED Final   Staphylococcus species NOT DETECTED NOT DETECTED Final   Staphylococcus aureus NOT DETECTED NOT DETECTED Final   Streptococcus species NOT DETECTED NOT DETECTED Final   Streptococcus agalactiae NOT DETECTED NOT DETECTED Final   Streptococcus pneumoniae NOT DETECTED NOT DETECTED Final   Streptococcus pyogenes NOT DETECTED NOT DETECTED Final   Acinetobacter baumannii NOT DETECTED NOT DETECTED Final   Enterobacteriaceae species DETECTED (A) NOT DETECTED Final    Comment: Enterobacteriaceae represent a large family of  gram-negative bacteria, not a single organism. CRITICAL RESULT CALLED TO, READ BACK BY AND VERIFIED WITH: E. MARTIN PHARMD, AT 3794 01/22/17 BY D. VANHOOK    Enterobacter cloacae complex NOT DETECTED NOT DETECTED Final   Escherichia coli NOT DETECTED NOT DETECTED Final   Klebsiella oxytoca NOT DETECTED NOT DETECTED Final   Klebsiella pneumoniae DETECTED (A) NOT DETECTED Final    Comment: CRITICAL RESULT CALLED TO, READ BACK BY AND VERIFIED WITH: E. MARTIN PHARMD, AT 1415 01/22/17 BY D. VANHOOK    Proteus species NOT DETECTED NOT DETECTED Final   Serratia marcescens NOT DETECTED NOT DETECTED Final   Carbapenem resistance NOT DETECTED NOT DETECTED Final   Haemophilus influenzae NOT DETECTED NOT DETECTED Final   Neisseria meningitidis NOT DETECTED NOT DETECTED Final   Pseudomonas aeruginosa NOT DETECTED NOT DETECTED Final   Candida albicans NOT DETECTED NOT DETECTED Final   Candida glabrata NOT DETECTED NOT DETECTED Final   Candida krusei NOT DETECTED NOT DETECTED Final   Candida parapsilosis NOT DETECTED NOT DETECTED Final   Candida tropicalis NOT DETECTED NOT DETECTED Final  Urine culture     Status: Abnormal (Preliminary result)   Collection Time: 01/21/17  8:31 PM  Result Value Ref Range Status   Specimen Description URINE, CATHETERIZED  Final   Special Requests NONE  Final   Culture (A)  Final    >=100,000 COLONIES/mL ESCHERICHIA COLI >=100,000 COLONIES/mL KLEBSIELLA PNEUMONIAE SUSCEPTIBILITIES TO FOLLOW    Report Status PENDING  Incomplete  Respiratory Panel by PCR     Status: None   Collection Time: 01/22/17  2:19 AM  Result Value Ref Range Status   Adenovirus NOT DETECTED NOT DETECTED Final   Coronavirus 229E NOT DETECTED NOT DETECTED Final   Coronavirus HKU1 NOT DETECTED NOT DETECTED Final   Coronavirus NL63 NOT DETECTED NOT DETECTED Final   Coronavirus OC43 NOT DETECTED NOT DETECTED Final   Metapneumovirus NOT DETECTED NOT DETECTED Final   Rhinovirus / Enterovirus NOT  DETECTED NOT DETECTED Final   Influenza A NOT DETECTED NOT DETECTED Final   Influenza B NOT DETECTED NOT DETECTED Final   Parainfluenza Virus 1  NOT DETECTED NOT DETECTED Final   Parainfluenza Virus 2 NOT DETECTED NOT DETECTED Final   Parainfluenza Virus 3 NOT DETECTED NOT DETECTED Final   Parainfluenza Virus 4 NOT DETECTED NOT DETECTED Final   Respiratory Syncytial Virus NOT DETECTED NOT DETECTED Final   Bordetella pertussis NOT DETECTED NOT DETECTED Final   Chlamydophila pneumoniae NOT DETECTED NOT DETECTED Final   Mycoplasma pneumoniae NOT DETECTED NOT DETECTED Final      Radiology Studies: Dg Chest 2 View Result Date: 01/21/2017 Suspect bibasilar infiltrates/pneumonia.  Possible aspiration.   Ct Head Wo Contrast Result Date: 01/21/2017 1. Some residual chronic subdural fluid is seen overlying the right cerebral convexity though decreased since prior, now measuring up to 6 mm in thickness versus 14 mm previously. There has been some interval development of right-sided dural thickening which can be seen in this setting since prior. 2. Resolution of pneumocephalus. 3. Interval decrease in right to left midline shift since prior. 4. No acute nor new findings since previous exam. Chronic small vessel ischemic disease of periventricular white matter.   Ct Chest Wo Contrast Result Date: 01/21/2017 1. 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 Subpleural right lower lobe and intraparenchymal left upper lobe ill-defined opacities are seen which may reflect more confluent areas of pneumonia, atelectasis or postinflammatory change. As neoplasm cannot be entirely excluded, recommend followup short term to assure improvement or resolution. 2. Aortic atherosclerosis.  Status post CABG. Electronically Signed   By: Ashley Royalty M.D.   On: 01/21/2017 21:34    . ALPRAZolam  0.25 mg Oral QHS  . amiodarone  200 mg Oral BID  . aspirin  81 mg Oral Daily  . atorvastatin   40 mg Oral Daily  . cefTRIAXone (ROCEPHIN)  IV  2 g Intravenous Q24H  . escitalopram  10 mg Oral QHS  . levETIRAcetam  500 mg Oral BID  . magnesium oxide  200 mg Oral Daily  . mouth rinse  15 mL Mouth Rinse BID  . metoprolol succinate  12.5 mg Oral Daily      Continuous Infusions: . sodium chloride 75 mL/hr at 01/23/17 0534     LOS: 1 day    Time spent: 25 minutes  Greater than 50% of the time spent on counseling and coordinating the care.   Leisa Lenz, MD [facility] Pager [phone]  If 7PM-7AM, please contact night-coverage [email or web address] Password [email or web address] 01/23/2017, 1:29 PM

## 2017-01-23 NOTE — Evaluation (Signed)
Physical Therapy Evaluation Patient Details Name: Alex [name] MRN: [id number] DOB: 07-Mar-1931 Today's Date: 01/23/2017   History of Present Illness  Patient is an 83 yo male admitted 01/21/17 with sepsis, pna, UTI, AMS, weakness.    PMH:  SDH 11/29/16 with stay on CIR - home on 01/10/17, COPD, Afib, CAD, CABG, OSA on O2 at night, pacemaker, AVR, obesity, HTN, HLD, depression, seizures.  Clinical Impression  Patient presents with problems listed below.  Will benefit from acute PT to maximize functional mobility prior to discharge.  Patient had been able to ambulate with RW short distances pta.  Today patient required +2 max assist to attempt to stand.  Recommend Inpatient Rehab consult to return patient to PLOF prior to return home.    Follow Up Recommendations CIR;Supervision/Assistance - 24 hour    Equipment Recommendations  None recommended by PT    Recommendations for Other Services Rehab consult;OT consult     Precautions / Restrictions Precautions Precautions: Fall Restrictions Weight Bearing Restrictions: No      Mobility  Bed Mobility Overal bed mobility: Needs Assistance Bed Mobility: Rolling;Supine to Sit;Sit to Sidelying Rolling: Mod assist   Supine to sit: Mod assist   Sit to sidelying: Max assist;+2 for physical assistance General bed mobility comments: Patient using bed rail and able to bring LE's off of bed.  Required assist to bring trunk to sitting position.  Patient able to sit EOB with min guard assist.  Required max assist to move to sidelying to be cleaned - had BM.  Transfers Overall transfer level: Needs assistance Equipment used: Rolling walker (2 wheeled) Transfers: Sit to/from Stand Sit to Stand: Max assist;+2 physical assistance         General transfer comment: Verbal cues for hand placement.  Provided +2 max assist to attempt to stand.  Required physical assist to get Lt hand onto RW.  Cues to bring hips under body, with physical cuing.  Unable  to reach full upright standing.  Returned to sitting and pt had large loose BM.  Moved to sidelying to be cleaned.  Ambulation/Gait             General Gait Details: Unable  Stairs            Wheelchair Mobility    Modified Rankin (Stroke Patients Only)       Balance Overall balance assessment: Needs assistance Sitting-balance support: Single extremity supported;Feet supported Sitting balance-[name]: Fair Sitting balance - Comments: Patient able to maintain static sitting.   Standing balance support: Bilateral upper extremity supported Standing balance-[name]: Zero Standing balance comment: Unable to reach upright standing position with +2 max assist.                             Pertinent Vitals/Pain Pain Assessment: No/denies pain    Home Living Family/patient expects to be discharged to:: Inpatient rehab Living Arrangements: Spouse/significant other Available Help at Discharge: Family;Personal care attendant;Available 24 hours/day Type of Home: House Home Access: Stairs to enter Entrance Stairs-Rails: Right Entrance Stairs-Number of Steps: 3 Home Layout: One level Home Equipment: Wheelchair - [employer] - 2 wheels      Prior Function Level of Independence: Independent with assistive device(s);Needs assistance   Gait / Transfers Assistance Needed: Used RW for gait short distances.  Progressively more weak requiring assist until unable to stand.  ADL's / Homemaking Assistance Needed: Assist with ADL's        Hand  Dominance   Dominant Hand: Right    Extremity/Trunk Assessment   Upper Extremity Assessment Upper Extremity Assessment: Generalized weakness    Lower Extremity Assessment Lower Extremity Assessment: Generalized weakness       Communication   Communication: No difficulties  Cognition Arousal/Alertness: Awake/alert Behavior During Therapy: Flat affect Overall Cognitive Status: Impaired/Different from  baseline Area of Impairment: Orientation;Attention;Memory;Problem solving;Following commands                 Orientation Level: Disoriented to;Place;Time;Situation Current Attention Level: Sustained Memory: Decreased short-term memory Following Commands: Follows one step commands inconsistently;Follows one step commands with increased time     Problem Solving: Slow processing;Decreased initiation;Difficulty sequencing;Requires verbal cues General Comments: Patient fatigues quickly, with increased lethargy.   Stated he was at [facility], and November.      General Comments      Exercises     Assessment/Plan    PT Assessment Patient needs continued PT services  PT Problem List Decreased strength;Decreased activity tolerance;Decreased balance;Decreased mobility;Decreased cognition;Decreased knowledge of use of DME;Cardiopulmonary status limiting activity;Obesity       PT Treatment Interventions DME instruction;Gait training;Functional mobility training;Therapeutic activities;Therapeutic exercise;Balance training;Neuromuscular re-education;Cognitive remediation;Patient/family education    PT Goals (Current goals can be found in the Care Plan section)  Acute Rehab PT Goals Patient Stated Goal: Get stronger and go home PT Goal Formulation: With patient/family Time For Goal Achievement: 02/06/17 Potential to Achieve Goals: Good    Frequency Min 3X/week   Barriers to discharge        Co-evaluation               End of Session Equipment Utilized During Treatment: Gait belt;Oxygen Activity Tolerance: Patient limited by fatigue Patient left: in bed;with call bell/phone within reach;with bed alarm set;with family/visitor present Nurse Communication: Mobility status (BM) PT Visit Diagnosis: Unsteadiness on feet (R26.81);Other abnormalities of gait and mobility (R26.89);Repeated falls (R29.6);Muscle weakness (generalized) (M62.81)    Time: 1401-1450 PT Time  Calculation (min) (ACUTE ONLY): 49 min   Charges:   PT Evaluation $PT Eval Moderate Complexity: 1 Procedure PT Treatments $Therapeutic Activity: 23-37 mins   PT G Codes:        Durenda Hurt. Renaldo Fiddler, [facility] Acute Rehab Services Pager [phone]   Vena Austria 01/23/2017, 3:54 PM

## 2017-01-24 LAB — BASIC METABOLIC PANEL
Anion gap: 9 (ref 5–15)
BUN: 14 mg/dL (ref 6–20)
CO2: 26 mmol/L (ref 22–32)
CREATININE: 0.74 mg/dL (ref 0.61–1.24)
Calcium: 7.8 mg/dL — ABNORMAL LOW (ref 8.9–10.3)
Chloride: 103 mmol/L (ref 101–111)
Glucose, Bld: 88 mg/dL (ref 65–99)
POTASSIUM: 3.9 mmol/L (ref 3.5–5.1)
SODIUM: 138 mmol/L (ref 135–145)

## 2017-01-24 LAB — CBC
HCT: 31.9 % — ABNORMAL LOW (ref 39.0–52.0)
HEMOGLOBIN: 10.4 g/dL — AB (ref 13.0–17.0)
MCH: 31.2 pg (ref 26.0–34.0)
MCHC: 32.6 g/dL (ref 30.0–36.0)
MCV: 95.8 fL (ref 78.0–100.0)
PLATELETS: 207 10*3/uL (ref 150–400)
RBC: 3.33 MIL/uL — AB (ref 4.22–5.81)
RDW: 15 % (ref 11.5–15.5)
WBC: 7.8 10*3/uL (ref 4.0–10.5)

## 2017-01-24 LAB — CULTURE, BLOOD (ROUTINE X 2)

## 2017-01-24 LAB — URINE CULTURE: Culture: >=100000 — AB

## 2017-01-24 MED ORDER — FUROSEMIDE 20 MG PO TABS
20.0000 mg | ORAL_TABLET | Freq: Every day | ORAL | Status: DC
  Administered 2017-01-24 – 2017-01-31 (×6): 20 mg via ORAL
  Filled 2017-01-24 (×7): qty 1

## 2017-01-24 NOTE — Progress Notes (Signed)
Patient ID: Alex [name], male   DOB: 01/25/1931, 83 y.o.   MRN: [id number]  PROGRESS NOTE    GILMAR BUA  RTM:[id number] DOB: 17-Dec-1930 DOA: 01/21/2017  PCP: Derrill Center, MD   Brief Narrative:  83 y.o. male with medical history significant for COPD, A. Fib, CAD s/p CABG, OSA on oxygen at night, s/p PPM, s/p AVR, morbid obesity. He was recently hospitalized in 11/2016 for traumatic subdural hematoma requiring craniotomy. He had prolonged rehabilitation stay for which he was just recently discharged home with 24-hour nursing care 1-1/2 weeks ago PTA. Patient has chronic atrial fibrillation and had an unsuccessful trial of cardioversion on 1/25 with Dr. Harrington Challenger. Following the subdural hematoma patient was taken off of Coumadin and has only been on a baby aspirin daily.   Pt presented to [facility] with generalized weakness for past 3 days prior to this admission. He also was more confused especially in the evenings. Pt reported having subjective fevers, decreased appetite.  In ED, pt had fever of 101.74F, pulse rate 38-81, respirations 17-28, blood pressure as low as 80/42, and O2 sats maintained above 90% on 2 L. Labs revealed normal WBC, hemoglobin 10.7, albumin 1.7, AST 95, ALT 97, lactic acid 1.98, and all other labs relatively unremarkable. Urinalysis showed trace leukocytes. CXR showed bibasilar infiltrates, possible aspiration. No acute findings on CT head. He was started on empiric abx for UTI and pneumonia.  Assessment & Plan:   Principal Problem:   Sepsis secondary to bibasilar pneumonia, possible aspiration pneumonitis / UTI secondary to E.COli and Klebsiella pneumoniae (HCC) / Klebsiella pneumoniae bacteremia  - Sepsis criteria met on admission with fever, tachypnea, tachycardia, hypotension. Source of infection is pneumonia and UTI - Urine cx grew E.Coli and Klebsiella pneumoniae - Blood cx grew Klebsiella pneumoniae - Repeat blood cx today to monitor resolution of bacteremia  - Resp virus  panel negative  - Vanco and zosyn stopped 3/29 and narrowed to rocephin   Active Problems:   Persistent atrial fibrillation (HCC) - CHADS vasc score at least 3 - Not on coumadin due to subdural hematoma; he is on aspirin 81 mg daily - Rate controlled with metoprolol and amiodarone       Essential hypertension - Continue metoprolol     Dyslipidemia - Continue Lipitor     Depression  - Continue Lexapro     Seizure disorder - Continue Keppra  - Continue xanax at bedtime     DVT prophylaxis: SCD's bilaterally  Code Status: DNR/DNI Family Communication: no family at the bedside this am Disposition Plan: CIR to evaluate    Consultants:   None   Procedures:   None   Antimicrobials:   Vanco and zosyn 3/28 --> 3/29  Rocephin 3/29 -->   Subjective: No overnight events.   Objective: Vitals:   01/23/17 1748 01/23/17 2043 01/24/17 0040 01/24/17 0615  BP: 102/68 (!) 97/56 128/78 113/65  Pulse: 95 (!) 48 (!) 116 (!) 116  Resp:  _0 Temp: 99.3 F (37.4 C) 98.9 F (37.2 C) 97.3 F (36.3 C) 98.1 F (36.7 C)  TempSrc: Axillary Axillary Oral Oral  SpO2: 92% 94% 93% 90%  Weight:      Height:        Intake/Output Summary (Last 24 hours) at 01/24/17 1114 Last data filed at 01/24/17 0616  Gross per 24 hour  Intake            782.5 ml  Output  975 ml  Net           -192.5 ml   Filed Weights   01/21/17 2033 01/22/17 0159 01/23/17 0500  Weight: 91.6 kg (202 lb) 97.5 kg (215 lb) 101.1 kg (222 lb 12.8 oz)    Examination:  General exam: no distress, calm Respiratory system: rhonchi at bases, no wheezing  Cardiovascular system: S1 & S2 heard, RRR Gastrointestinal system: (+) BS, no distention, no tenderness  Central nervous system: Nonfocal  Extremities: no edema, palpable pulses Skin: Skin is warm and dry Psychiatry: Normal mood and behavior, no agitation or restlessness    Data Reviewed: I have personally reviewed following labs and imaging  studies  CBC:  Recent Labs Lab 01/21/17 1845 01/22/17 0639 01/23/17 0303 01/24/17 0523  WBC 7.4 6.9 8.2 7.8  NEUTROABS 5.3  --   --   --   HGB 10.7* 10.3* 10.6* 10.4*  HCT 33.2* 32.1* 32.8* 31.9*  MCV 97.1 97.6 96.2 95.8  PLT 188 164 189 528   Basic Metabolic Panel:  Recent Labs Lab 01/21/17 1845 01/22/17 0243 01/23/17 0303 01/24/17 0523  NA 137 140 138 138  K 4.6 4.8 3.9 3.9  CL 99* 103 103 103  CO2 _0 GLUCOSE 110* 99 102* 88  BUN _1 CREATININE 0.91 0.94 0.86 0.74  CALCIUM 8.0* 7.6* 7.7* 7.8*   GFR: Estimated Creatinine Clearance: 87.3 mL/min (by C-G formula based on SCr of 0.74 mg/dL). Liver Function Tests:  Recent Labs Lab 01/21/17 1845 01/22/17 0243  AST 95* 77*  ALT 97* 87*  ALKPHOS 103 94  BILITOT 0.6 0.8  PROT 5.1* 4.9*  ALBUMIN 1.7* 1.6*   No results for input(s): LIPASE, AMYLASE in the last 168 hours. No results for input(s): AMMONIA in the last 168 hours. Coagulation Profile:  Recent Labs Lab 01/21/17 1845  INR 1.32   Cardiac Enzymes:  Recent Labs Lab 01/22/17 0243 01/22/17 0639  TROPONINI <0.03 <0.03   BNP (last 3 results) No results for input(s): PROBNP in the last 8760 hours. HbA1C: No results for input(s): HGBA1C in the last 72 hours. CBG: No results for input(s): GLUCAP in the last 168 hours. Lipid Profile: No results for input(s): CHOL, HDL, LDLCALC, TRIG, CHOLHDL, LDLDIRECT in the last 72 hours. Thyroid Function Tests: No results for input(s): TSH, T4TOTAL, FREET4, T3FREE, THYROIDAB in the last 72 hours. Anemia Panel: No results for input(s): VITAMINB12, FOLATE, FERRITIN, TIBC, IRON, RETICCTPCT in the last 72 hours. Urine analysis:    Component Value Date/Time   COLORURINE AMBER (A) 01/21/2017 2031   APPEARANCEUR HAZY (A) 01/21/2017 2031   LABSPEC 1.015 01/21/2017 2031   PHURINE 6.0 01/21/2017 2031   GLUCOSEU NEGATIVE 01/21/2017 2031   HGBUR NEGATIVE 01/21/2017 2031   BILIRUBINUR NEGATIVE  01/21/2017 2031   [place] NEGATIVE 01/21/2017 2031   PROTEINUR 30 (A) 01/21/2017 2031   NITRITE NEGATIVE 01/21/2017 2031   LEUKOCYTESUR TRACE (A) 01/21/2017 2031   Sepsis Labs: _2 (procalcitonin:4,lacticidven:4)   ) Recent Results (from the past 240 hour(s))  Culture, blood (Routine x 2)     Status: None (Preliminary result)   Collection Time: 01/21/17  8:10 PM  Result Value Ref Range Status   Specimen Description BLOOD LEFT HAND  Final   Special Requests IN PEDIATRIC BOTTLE 4CC  Final   Culture NO GROWTH 2 DAYS  Final   Report Status PENDING  Incomplete  Culture, blood (Routine x 2)     Status: Abnormal  Collection Time: 01/21/17  8:20 PM  Result Value Ref Range Status   Specimen Description BLOOD RIGHT ANTECUBITAL  Final   Special Requests BOTTLES DRAWN AEROBIC AND ANAEROBIC 5CC  Final   Culture  Setup Time   Final    GRAM NEGATIVE RODS AEROBIC BOTTLE ONLY CRITICAL RESULT CALLED TO, READ BACK BY AND VERIFIED WITH: E. MARTIN PHARMD, AT 3159 01/22/17 BY D. VANHOOK    Culture KLEBSIELLA PNEUMONIAE (A)  Final   Report Status 01/24/2017 FINAL  Final   Organism ID, Bacteria KLEBSIELLA PNEUMONIAE  Final      Susceptibility   Klebsiella pneumoniae - MIC*    AMPICILLIN >=32 RESISTANT Resistant     CEFAZOLIN <=4 SENSITIVE Sensitive     CEFEPIME <=1 SENSITIVE Sensitive     CEFTAZIDIME <=1 SENSITIVE Sensitive     CEFTRIAXONE <=1 SENSITIVE Sensitive     CIPROFLOXACIN 1 SENSITIVE Sensitive     GENTAMICIN <=1 SENSITIVE Sensitive     IMIPENEM <=0.25 SENSITIVE Sensitive     TRIMETH/SULFA 40 SENSITIVE Sensitive     AMPICILLIN/SULBACTAM 16 INTERMEDIATE Intermediate     PIP/TAZO 8 SENSITIVE Sensitive     Extended ESBL NEGATIVE Sensitive     * KLEBSIELLA PNEUMONIAE  Blood Culture ID Panel (Reflexed)     Status: Abnormal   Collection Time: 01/21/17  8:20 PM  Result Value Ref Range Status   Enterococcus species NOT DETECTED NOT DETECTED Final   Listeria monocytogenes NOT  DETECTED NOT DETECTED Final   Staphylococcus species NOT DETECTED NOT DETECTED Final   Staphylococcus aureus NOT DETECTED NOT DETECTED Final   Streptococcus species NOT DETECTED NOT DETECTED Final   Streptococcus agalactiae NOT DETECTED NOT DETECTED Final   Streptococcus pneumoniae NOT DETECTED NOT DETECTED Final   Streptococcus pyogenes NOT DETECTED NOT DETECTED Final   Acinetobacter baumannii NOT DETECTED NOT DETECTED Final   Enterobacteriaceae species DETECTED (A) NOT DETECTED Final    Comment: Enterobacteriaceae represent a large family of gram-negative bacteria, not a single organism. CRITICAL RESULT CALLED TO, READ BACK BY AND VERIFIED WITH: E. MARTIN PHARMD, AT 4585 01/22/17 BY D. VANHOOK    Enterobacter cloacae complex NOT DETECTED NOT DETECTED Final   Escherichia coli NOT DETECTED NOT DETECTED Final   Klebsiella oxytoca NOT DETECTED NOT DETECTED Final   Klebsiella pneumoniae DETECTED (A) NOT DETECTED Final    Comment: CRITICAL RESULT CALLED TO, READ BACK BY AND VERIFIED WITH: E. MARTIN PHARMD, AT 1415 01/22/17 BY D. VANHOOK    Proteus species NOT DETECTED NOT DETECTED Final   Serratia marcescens NOT DETECTED NOT DETECTED Final   Carbapenem resistance NOT DETECTED NOT DETECTED Final   Haemophilus influenzae NOT DETECTED NOT DETECTED Final   Neisseria meningitidis NOT DETECTED NOT DETECTED Final   Pseudomonas aeruginosa NOT DETECTED NOT DETECTED Final   Candida albicans NOT DETECTED NOT DETECTED Final   Candida glabrata NOT DETECTED NOT DETECTED Final   Candida krusei NOT DETECTED NOT DETECTED Final   Candida parapsilosis NOT DETECTED NOT DETECTED Final   Candida tropicalis NOT DETECTED NOT DETECTED Final  Urine culture     Status: Abnormal   Collection Time: 01/21/17  8:31 PM  Result Value Ref Range Status   Specimen Description URINE, CATHETERIZED  Final   Special Requests NONE  Final   Culture (A)  Final    >=100,000 COLONIES/mL ESCHERICHIA COLI >=100,000 COLONIES/mL  KLEBSIELLA PNEUMONIAE    Report Status 01/24/2017 FINAL  Final   Organism ID, Bacteria ESCHERICHIA COLI (A)  Final  Organism ID, Bacteria KLEBSIELLA PNEUMONIAE (A)  Final      Susceptibility   Escherichia coli - MIC*    AMPICILLIN <=2 SENSITIVE Sensitive     CEFAZOLIN <=4 SENSITIVE Sensitive     CEFTRIAXONE <=1 SENSITIVE Sensitive     CIPROFLOXACIN >=4 RESISTANT Resistant     GENTAMICIN <=1 SENSITIVE Sensitive     IMIPENEM <=0.25 SENSITIVE Sensitive     NITROFURANTOIN <=16 SENSITIVE Sensitive     TRIMETH/SULFA <=20 SENSITIVE Sensitive     AMPICILLIN/SULBACTAM <=2 SENSITIVE Sensitive     PIP/TAZO <=4 SENSITIVE Sensitive     Extended ESBL NEGATIVE Sensitive     * >=100,000 COLONIES/mL ESCHERICHIA COLI   Klebsiella pneumoniae - MIC*    AMPICILLIN >=32 RESISTANT Resistant     CEFAZOLIN <=4 SENSITIVE Sensitive     CEFTRIAXONE <=1 SENSITIVE Sensitive     CIPROFLOXACIN <=0.25 SENSITIVE Sensitive     GENTAMICIN <=1 SENSITIVE Sensitive     IMIPENEM <=0.25 SENSITIVE Sensitive     NITROFURANTOIN 64 INTERMEDIATE Intermediate     TRIMETH/SULFA <=20 SENSITIVE Sensitive     AMPICILLIN/SULBACTAM 4 SENSITIVE Sensitive     PIP/TAZO <=4 SENSITIVE Sensitive     Extended ESBL NEGATIVE Sensitive     * >=100,000 COLONIES/mL KLEBSIELLA PNEUMONIAE  Respiratory Panel by PCR     Status: None   Collection Time: 01/22/17  2:19 AM  Result Value Ref Range Status   Adenovirus NOT DETECTED NOT DETECTED Final   Coronavirus 229E NOT DETECTED NOT DETECTED Final   Coronavirus HKU1 NOT DETECTED NOT DETECTED Final   Coronavirus NL63 NOT DETECTED NOT DETECTED Final   Coronavirus OC43 NOT DETECTED NOT DETECTED Final   Metapneumovirus NOT DETECTED NOT DETECTED Final   Rhinovirus / Enterovirus NOT DETECTED NOT DETECTED Final   Influenza A NOT DETECTED NOT DETECTED Final   Influenza B NOT DETECTED NOT DETECTED Final   Parainfluenza Virus 1 NOT DETECTED NOT DETECTED Final   Parainfluenza Virus 2 NOT DETECTED NOT  DETECTED Final   Parainfluenza Virus 3 NOT DETECTED NOT DETECTED Final   Parainfluenza Virus 4 NOT DETECTED NOT DETECTED Final   Respiratory Syncytial Virus NOT DETECTED NOT DETECTED Final   Bordetella pertussis NOT DETECTED NOT DETECTED Final   Chlamydophila pneumoniae NOT DETECTED NOT DETECTED Final   Mycoplasma pneumoniae NOT DETECTED NOT DETECTED Final      Radiology Studies: Dg Chest 2 View Result Date: 01/21/2017 Suspect bibasilar infiltrates/pneumonia.  Possible aspiration.   Ct Head Wo Contrast Result Date: 01/21/2017 1. Some residual chronic subdural fluid is seen overlying the right cerebral convexity though decreased since prior, now measuring up to 6 mm in thickness versus 14 mm previously. There has been some interval development of right-sided dural thickening which can be seen in this setting since prior. 2. Resolution of pneumocephalus. 3. Interval decrease in right to left midline shift since prior. 4. No acute nor new findings since previous exam. Chronic small vessel ischemic disease of periventricular white matter.   Ct Chest Wo Contrast Result Date: 01/21/2017 1. 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 Subpleural right lower lobe and intraparenchymal left upper lobe ill-defined opacities are seen which may reflect more confluent areas of pneumonia, atelectasis or postinflammatory change. As neoplasm cannot be entirely excluded, recommend followup short term to assure improvement or resolution. 2. Aortic atherosclerosis.  Status post CABG. Electronically Signed   By: Ashley Royalty M.D.   On: 01/21/2017 21:34    . ALPRAZolam  0.25 mg Oral  QHS  . amiodarone  200 mg Oral BID  . aspirin  81 mg Oral Daily  . atorvastatin  40 mg Oral Daily  . cefTRIAXone (ROCEPHIN)  IV  2 g Intravenous Q24H  . escitalopram  10 mg Oral QHS  . levETIRAcetam  500 mg Oral BID  . magnesium oxide  200 mg Oral Daily  . mouth rinse  15 mL Mouth Rinse BID  .  metoprolol succinate  12.5 mg Oral Daily      Continuous Infusions: . sodium chloride 75 mL/hr at 01/24/17 0713     LOS: 2 days    Time spent: 25 minutes  Greater than 50% of the time spent on counseling and coordinating the care.   Leisa Lenz, MD [facility] Pager [phone]  If 7PM-7AM, please contact night-coverage [email or web address] Password [email or web address] 01/24/2017, 11:14 AM

## 2017-01-24 NOTE — Progress Notes (Signed)
Pt B/P 81/50, pulse 81. Provider made aware . New IV in right wriest N.S at 75 cc/hr  Restarted. Amiodarone scheduled for 2200 hrs held.

## 2017-01-25 ENCOUNTER — Inpatient Hospital Stay (HOSPITAL_COMMUNITY): Payer: Medicare Other

## 2017-01-25 LAB — BASIC METABOLIC PANEL
Anion gap: 9 (ref 5–15)
BUN: 13 mg/dL (ref 6–20)
CALCIUM: 7.7 mg/dL — AB (ref 8.9–10.3)
CO2: 24 mmol/L (ref 22–32)
CREATININE: 0.64 mg/dL (ref 0.61–1.24)
Chloride: 101 mmol/L (ref 101–111)
GFR calc non Af Amer: >60 mL/min (ref >60)
GLUCOSE: 92 mg/dL (ref 65–99)
Potassium: 3.5 mmol/L (ref 3.5–5.1)
Sodium: 134 mmol/L — ABNORMAL LOW (ref 135–145)

## 2017-01-25 LAB — CBC
HEMATOCRIT: 32.8 % — AB (ref 39.0–52.0)
Hemoglobin: 10.7 g/dL — ABNORMAL LOW (ref 13.0–17.0)
MCH: 31.2 pg (ref 26.0–34.0)
MCHC: 32.6 g/dL (ref 30.0–36.0)
MCV: 95.6 fL (ref 78.0–100.0)
Platelets: 236 10*3/uL (ref 150–400)
RBC: 3.43 MIL/uL — AB (ref 4.22–5.81)
RDW: 15.3 % (ref 11.5–15.5)
WBC: 8.3 10*3/uL (ref 4.0–10.5)

## 2017-01-25 NOTE — Plan of Care (Signed)
Problem: Respiratory: Goal: Ability to maintain adequate ventilation will improve Outcome: Progressing Educated Pt on incentive spirometer use. Return demonstration by Pt. Indicates teaching is effective

## 2017-01-25 NOTE — Progress Notes (Signed)
Residents blood pressure 104/50 will continue to monitor.

## 2017-01-25 NOTE — Progress Notes (Signed)
Patient ID: Alex [name], male   DOB: Sep 24, 1931, 83 y.o.   MRN: [id number]  PROGRESS NOTE    Alex [name]  EUM:[id number] DOB: [birth year] DOA: 01/21/2017  PCP: Alex Center, MD   Brief Narrative:  83 y.o. male with medical history significant for COPD, A. Fib, CAD s/p CABG, OSA on oxygen at night, s/p PPM, s/p AVR, morbid obesity. He was recently hospitalized in 11/2016 for traumatic subdural hematoma requiring craniotomy. He had prolonged rehabilitation stay for which he was just recently discharged home with 24-hour nursing care 1-1/2 weeks ago PTA. Patient has chronic atrial fibrillation and had an unsuccessful trial of cardioversion on 1/25 with Dr. Harrington Challenger. Following the subdural hematoma patient was taken off of Coumadin and has only been on a baby aspirin daily.   Pt presented to [facility] with generalized weakness for past 3 days prior to this admission. He also was more confused especially in the evenings. Pt reported having subjective fevers, decreased appetite.  In ED, pt had fever of 101.60F, pulse rate 38-81, respirations 17-28, blood pressure as low as 80/42, and O2 sats maintained above 90% on 2 L. Labs revealed normal WBC, hemoglobin 10.7, albumin 1.7, AST 95, ALT 97, lactic acid 1.98, and all other labs relatively unremarkable. Urinalysis showed trace leukocytes. CXR showed bibasilar infiltrates, possible aspiration. No acute findings on CT head. He was started on empiric abx for UTI and pneumonia.  Assessment & Plan:   Principal Problem:   Sepsis secondary to bibasilar pneumonia, possible aspiration pneumonitis / UTI secondary to E.COli and Klebsiella pneumoniae (HCC) / Klebsiella pneumoniae bacteremia  - Sepsis criteria met on admission with fever, tachypnea, tachycardia, hypotension. Source of infection is pneumonia and UTI - Urine cx grew E.Coli and Klebsiella pneumoniae - Blood cx grew Klebsiella pneumoniae - Repeat blood cx 3/31 are pending  - Resp virus panel negative  - Vanco  and zosyn stopped 3/29 and narrowed to rocephin   Active Problems:   Persistent atrial fibrillation (HCC) - CHADS vasc score at least 3 - Not on coumadin due to subdural hematoma; he is on aspirin 81 mg daily - Rate controlled with metoprolol and amiodarone       Essential hypertension - Continue metoprolol     Dyslipidemia - Continue Lipitor     Depression  - Continue Lexapro     Seizure disorder - Continue Keppra  - Continue xanax at bedtime     DVT prophylaxis: SCD's bilaterally  Code Status: DNR/DNI Family Communication: no family at the bedside this am; called his wife this am to give updates  Disposition Plan: CIR to evaluate    Consultants:   Inpatient rehab   Procedures:   None   Antimicrobials:   Vanco and zosyn 3/28 --> 3/29  Rocephin 3/29 -->   Subjective: No overnight events.   Objective: Vitals:   01/24/17 2349 01/25/17 0000 01/25/17 0435 01/25/17 0500  BP: (!) 104/50  129/77   Pulse: 86  (!) 108   Resp: 18  20   Temp: 98 F (36.7 C)  97.9 F (36.6 C)   TempSrc: Oral  Oral   SpO2: 92% 92% 91%   Weight:    102.2 kg (225 lb 3.2 oz)  Height:        Intake/Output Summary (Last 24 hours) at 01/25/17 1019 Last data filed at 01/25/17 0930  Gross per 24 hour  Intake               40 ml  Output             1400 ml  Net            -1360 ml   Filed Weights   01/22/17 0159 01/23/17 0500 01/25/17 0500  Weight: 97.5 kg (215 lb) 101.1 kg (222 lb 12.8 oz) 102.2 kg (225 lb 3.2 oz)    Examination:  General exam: no distress, calm, comfortable  Respiratory system: rhonchi at bases, wheezing in upper lung lobes  Cardiovascular system: S1 & S2 heard, rate controlled  Gastrointestinal system: (+) BS, non tender, non distended  Central nervous system: Nonfocal  Extremities: no edema, pulses palpable bilaterally  Skin: no lesions or ulcers  Psychiatry: Normal mood and behavior  Data Reviewed: I have personally reviewed following labs and  imaging studies  CBC:  Recent Labs Lab 01/21/17 1845 01/22/17 0639 01/23/17 0303 01/24/17 0523 01/25/17 0309  WBC 7.4 6.9 8.2 7.8 8.3  NEUTROABS 5.3  --   --   --   --   HGB 10.7* 10.3* 10.6* 10.4* 10.7*  HCT 33.2* 32.1* 32.8* 31.9* 32.8*  MCV 97.1 97.6 96.2 95.8 95.6  PLT 188 164 189 207 284   Basic Metabolic Panel:  Recent Labs Lab 01/21/17 1845 01/22/17 0243 01/23/17 0303 01/24/17 0523 01/25/17 0309  NA 137 140 138 138 134*  K 4.6 4.8 3.9 3.9 3.5  CL 99* 103 103 103 101  CO2 28 28 26 26 24   GLUCOSE 110* 99 102* 88 92  BUN 19 17 17 14 13   CREATININE 0.91 0.94 0.86 0.74 0.64  CALCIUM 8.0* 7.6* 7.7* 7.8* 7.7*   GFR: Estimated Creatinine Clearance: 87.7 mL/min (by C-G formula based on SCr of 0.64 mg/dL). Liver Function Tests:  Recent Labs Lab 01/21/17 1845 01/22/17 0243  AST 95* 77*  ALT 97* 87*  ALKPHOS 103 94  BILITOT 0.6 0.8  PROT 5.1* 4.9*  ALBUMIN 1.7* 1.6*   No results for input(s): LIPASE, AMYLASE in the last 168 hours. No results for input(s): AMMONIA in the last 168 hours. Coagulation Profile:  Recent Labs Lab 01/21/17 1845  INR 1.32   Cardiac Enzymes:  Recent Labs Lab 01/22/17 0243 01/22/17 0639  TROPONINI <0.03 <0.03   BNP (last 3 results) No results for input(s): PROBNP in the last 8760 hours. HbA1C: No results for input(s): HGBA1C in the last 72 hours. CBG: No results for input(s): GLUCAP in the last 168 hours. Lipid Profile: No results for input(s): CHOL, HDL, LDLCALC, TRIG, CHOLHDL, LDLDIRECT in the last 72 hours. Thyroid Function Tests: No results for input(s): TSH, T4TOTAL, FREET4, T3FREE, THYROIDAB in the last 72 hours. Anemia Panel: No results for input(s): VITAMINB12, FOLATE, FERRITIN, TIBC, IRON, RETICCTPCT in the last 72 hours. Urine analysis:    Component Value Date/Time   COLORURINE AMBER (A) 01/21/2017 2031   APPEARANCEUR HAZY (A) 01/21/2017 2031   LABSPEC 1.015 01/21/2017 2031   PHURINE 6.0 01/21/2017 2031     GLUCOSEU NEGATIVE 01/21/2017 2031   HGBUR NEGATIVE 01/21/2017 2031   BILIRUBINUR NEGATIVE 01/21/2017 2031   [place] NEGATIVE 01/21/2017 2031   PROTEINUR 30 (A) 01/21/2017 2031   NITRITE NEGATIVE 01/21/2017 2031   LEUKOCYTESUR TRACE (A) 01/21/2017 2031   Sepsis Labs: @LABRCNTIP (procalcitonin:4,lacticidven:4)   ) Recent Results (from the past 240 hour(s))  Culture, blood (Routine x 2)     Status: None (Preliminary result)   Collection Time: 01/21/17  8:10 PM  Result Value Ref Range Status   Specimen Description BLOOD LEFT HAND  Final   Special Requests IN PEDIATRIC BOTTLE 4CC  Final   Culture NO GROWTH 3 DAYS  Final   Report Status PENDING  Incomplete  Culture, blood (Routine x 2)     Status: Abnormal   Collection Time: 01/21/17  8:20 PM  Result Value Ref Range Status   Specimen Description BLOOD RIGHT ANTECUBITAL  Final   Special Requests BOTTLES DRAWN AEROBIC AND ANAEROBIC 5CC  Final   Culture  Setup Time   Final    GRAM NEGATIVE RODS AEROBIC BOTTLE ONLY CRITICAL RESULT CALLED TO, READ BACK BY AND VERIFIED WITH: E. MARTIN PHARMD, AT [id number] 01/22/17 BY D. VANHOOK    Culture KLEBSIELLA PNEUMONIAE (A)  Final   Report Status 01/24/2017 FINAL  Final   Organism ID, Bacteria KLEBSIELLA PNEUMONIAE  Final      Susceptibility   Klebsiella pneumoniae - MIC*    AMPICILLIN >=32 RESISTANT Resistant     CEFAZOLIN <=4 SENSITIVE Sensitive     CEFEPIME <=1 SENSITIVE Sensitive     CEFTAZIDIME <=1 SENSITIVE Sensitive     CEFTRIAXONE <=1 SENSITIVE Sensitive     CIPROFLOXACIN 1 SENSITIVE Sensitive     GENTAMICIN <=1 SENSITIVE Sensitive     IMIPENEM <=0.25 SENSITIVE Sensitive     TRIMETH/SULFA 40 SENSITIVE Sensitive     AMPICILLIN/SULBACTAM 16 INTERMEDIATE Intermediate     PIP/TAZO 8 SENSITIVE Sensitive     Extended ESBL NEGATIVE Sensitive     * KLEBSIELLA PNEUMONIAE  Blood Culture ID Panel (Reflexed)     Status: Abnormal   Collection Time: 01/21/17  8:20 PM  Result Value Ref Range  Status   Enterococcus species NOT DETECTED NOT DETECTED Final   Listeria monocytogenes NOT DETECTED NOT DETECTED Final   Staphylococcus species NOT DETECTED NOT DETECTED Final   Staphylococcus aureus NOT DETECTED NOT DETECTED Final   Streptococcus species NOT DETECTED NOT DETECTED Final   Streptococcus agalactiae NOT DETECTED NOT DETECTED Final   Streptococcus pneumoniae NOT DETECTED NOT DETECTED Final   Streptococcus pyogenes NOT DETECTED NOT DETECTED Final   Acinetobacter baumannii NOT DETECTED NOT DETECTED Final   Enterobacteriaceae species DETECTED (A) NOT DETECTED Final    Comment: Enterobacteriaceae represent a large family of gram-negative bacteria, not a single organism. CRITICAL RESULT CALLED TO, READ BACK BY AND VERIFIED WITH: E. MARTIN PHARMD, AT 7591 01/22/17 BY D. VANHOOK    Enterobacter cloacae complex NOT DETECTED NOT DETECTED Final   Escherichia coli NOT DETECTED NOT DETECTED Final   Klebsiella oxytoca NOT DETECTED NOT DETECTED Final   Klebsiella pneumoniae DETECTED (A) NOT DETECTED Final    Comment: CRITICAL RESULT CALLED TO, READ BACK BY AND VERIFIED WITH: E. MARTIN PHARMD, AT 1415 01/22/17 BY D. VANHOOK    Proteus species NOT DETECTED NOT DETECTED Final   Serratia marcescens NOT DETECTED NOT DETECTED Final   Carbapenem resistance NOT DETECTED NOT DETECTED Final   Haemophilus influenzae NOT DETECTED NOT DETECTED Final   Neisseria meningitidis NOT DETECTED NOT DETECTED Final   Pseudomonas aeruginosa NOT DETECTED NOT DETECTED Final   Candida albicans NOT DETECTED NOT DETECTED Final   Candida glabrata NOT DETECTED NOT DETECTED Final   Candida krusei NOT DETECTED NOT DETECTED Final   Candida parapsilosis NOT DETECTED NOT DETECTED Final   Candida tropicalis NOT DETECTED NOT DETECTED Final  Urine culture     Status: Abnormal   Collection Time: 01/21/17  8:31 PM  Result Value Ref Range Status   Specimen Description URINE, CATHETERIZED  Final   Special Requests NONE  Final   Culture (A)  Final    >=100,000 COLONIES/mL ESCHERICHIA COLI >=100,000 COLONIES/mL KLEBSIELLA PNEUMONIAE    Report Status 01/24/2017 FINAL  Final   Organism ID, Bacteria ESCHERICHIA COLI (A)  Final   Organism ID, Bacteria KLEBSIELLA PNEUMONIAE (A)  Final      Susceptibility   Escherichia coli - MIC*    AMPICILLIN <=2 SENSITIVE Sensitive     CEFAZOLIN <=4 SENSITIVE Sensitive     CEFTRIAXONE <=1 SENSITIVE Sensitive     CIPROFLOXACIN >=4 RESISTANT Resistant     GENTAMICIN <=1 SENSITIVE Sensitive     IMIPENEM <=0.25 SENSITIVE Sensitive     NITROFURANTOIN <=16 SENSITIVE Sensitive     TRIMETH/SULFA <=20 SENSITIVE Sensitive     AMPICILLIN/SULBACTAM <=2 SENSITIVE Sensitive     PIP/TAZO <=4 SENSITIVE Sensitive     Extended ESBL NEGATIVE Sensitive     * >=100,000 COLONIES/mL ESCHERICHIA COLI   Klebsiella pneumoniae - MIC*    AMPICILLIN >=32 RESISTANT Resistant     CEFAZOLIN <=4 SENSITIVE Sensitive     CEFTRIAXONE <=1 SENSITIVE Sensitive     CIPROFLOXACIN <=0.25 SENSITIVE Sensitive     GENTAMICIN <=1 SENSITIVE Sensitive     IMIPENEM <=0.25 SENSITIVE Sensitive     NITROFURANTOIN 64 INTERMEDIATE Intermediate     TRIMETH/SULFA <=20 SENSITIVE Sensitive     AMPICILLIN/SULBACTAM 4 SENSITIVE Sensitive     PIP/TAZO <=4 SENSITIVE Sensitive     Extended ESBL NEGATIVE Sensitive     * >=100,000 COLONIES/mL KLEBSIELLA PNEUMONIAE  Respiratory Panel by PCR     Status: None   Collection Time: 01/22/17  2:19 AM  Result Value Ref Range Status   Adenovirus NOT DETECTED NOT DETECTED Final   Coronavirus 229E NOT DETECTED NOT DETECTED Final   Coronavirus HKU1 NOT DETECTED NOT DETECTED Final   Coronavirus NL63 NOT DETECTED NOT DETECTED Final   Coronavirus OC43 NOT DETECTED NOT DETECTED Final   Metapneumovirus NOT DETECTED NOT DETECTED Final   Rhinovirus / Enterovirus NOT DETECTED NOT DETECTED Final   Influenza A NOT DETECTED NOT DETECTED Final   Influenza B NOT DETECTED NOT DETECTED Final    Parainfluenza Virus 1 NOT DETECTED NOT DETECTED Final   Parainfluenza Virus 2 NOT DETECTED NOT DETECTED Final   Parainfluenza Virus 3 NOT DETECTED NOT DETECTED Final   Parainfluenza Virus 4 NOT DETECTED NOT DETECTED Final   Respiratory Syncytial Virus NOT DETECTED NOT DETECTED Final   Bordetella pertussis NOT DETECTED NOT DETECTED Final   Chlamydophila pneumoniae NOT DETECTED NOT DETECTED Final   Mycoplasma pneumoniae NOT DETECTED NOT DETECTED Final      Radiology Studies: Dg Chest 2 View Result Date: 01/21/2017 Suspect bibasilar infiltrates/pneumonia.  Possible aspiration.   Ct Head Wo Contrast Result Date: 01/21/2017 1. Some residual chronic subdural fluid is seen overlying the right cerebral convexity though decreased since prior, now measuring up to 6 mm in thickness versus 14 mm previously. There has been some interval development of right-sided dural thickening which can be seen in this setting since prior. 2. Resolution of pneumocephalus. 3. Interval decrease in right to left midline shift since prior. 4. No acute nor new findings since previous exam. Chronic small vessel ischemic disease of periventricular white matter.   Ct Chest Wo Contrast Result Date: 01/21/2017 1. 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 Subpleural right lower lobe and intraparenchymal left upper lobe ill-defined opacities are seen which may reflect more confluent areas of pneumonia, atelectasis or postinflammatory change. As neoplasm cannot be entirely excluded,  recommend followup short term to assure improvement or resolution. 2. Aortic atherosclerosis.  Status post CABG. Electronically Signed   By: Ashley Royalty M.D.   On: 01/21/2017 21:34    . ALPRAZolam  0.25 mg Oral QHS  . amiodarone  200 mg Oral BID  . aspirin  81 mg Oral Daily  . atorvastatin  40 mg Oral Daily  . cefTRIAXone (ROCEPHIN)  IV  2 g Intravenous Q24H  . escitalopram  10 mg Oral QHS  . furosemide  20 mg  Oral Daily  . levETIRAcetam  500 mg Oral BID  . magnesium oxide  200 mg Oral Daily  . mouth rinse  15 mL Mouth Rinse BID  . metoprolol succinate  12.5 mg Oral Daily      Continuous Infusions: . sodium chloride 75 mL/hr at 01/24/17 0713     LOS: 3 days    Time spent: 15 minutes  Greater than 50% of the time spent on counseling and coordinating the care.   Leisa Lenz, MD [facility] Pager [phone]  If 7PM-7AM, please contact night-coverage [email or web address] Password [email or web address] 01/25/2017, 10:19 AM

## 2017-01-26 DIAGNOSIS — Z515 Encounter for palliative care: Secondary | ICD-10-CM

## 2017-01-26 DIAGNOSIS — A419 Sepsis, unspecified organism: Secondary | ICD-10-CM

## 2017-01-26 DIAGNOSIS — R5381 Other malaise: Secondary | ICD-10-CM

## 2017-01-26 DIAGNOSIS — Z7189 Other specified counseling: Secondary | ICD-10-CM

## 2017-01-26 DIAGNOSIS — Z87828 Personal history of other (healed) physical injury and trauma: Secondary | ICD-10-CM

## 2017-01-26 LAB — BASIC METABOLIC PANEL
ANION GAP: 9 (ref 5–15)
BUN: 14 mg/dL (ref 6–20)
CO2: 24 mmol/L (ref 22–32)
Calcium: 7.6 mg/dL — ABNORMAL LOW (ref 8.9–10.3)
Chloride: 103 mmol/L (ref 101–111)
Creatinine, Ser: 0.7 mg/dL (ref 0.61–1.24)
GLUCOSE: 89 mg/dL (ref 65–99)
POTASSIUM: 4 mmol/L (ref 3.5–5.1)
SODIUM: 136 mmol/L (ref 135–145)

## 2017-01-26 LAB — CULTURE, BLOOD (ROUTINE X 2): CULTURE: NO GROWTH

## 2017-01-26 LAB — CBC
HEMATOCRIT: 30.4 % — AB (ref 39.0–52.0)
HEMOGLOBIN: 10.1 g/dL — AB (ref 13.0–17.0)
MCH: 31.2 pg (ref 26.0–34.0)
MCHC: 33.2 g/dL (ref 30.0–36.0)
MCV: 93.8 fL (ref 78.0–100.0)
PLATELETS: 233 10*3/uL (ref 150–400)
RBC: 3.24 MIL/uL — ABNORMAL LOW (ref 4.22–5.81)
RDW: 15.3 % (ref 11.5–15.5)
WBC: 8.5 10*3/uL (ref 4.0–10.5)

## 2017-01-26 NOTE — Consult Note (Signed)
Physical Medicine and Rehabilitation Consult Reason for Consult: Debilitation with decreased functional mobility Referring Physician: [facility]   HPI: Alex [name] is a 83 y.o. 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 Presented 01/21/2017 with decreased appetite, low-grade fever, hypotension and dry cough. Chest x-ray suspect by basilar infiltrates/bony. Possible aspiration. CT of the head with no acute abnormality since previous exam. Urine study negative nitrites. Lactic acid 1.98, WBC 7.4, BUN 19, creatinine 0.91. Maintain on sepsis protocol broad-spectrum antibiotics. IV fluids added to maintain blood pressure. Blood cultures grew out Klebsiella pneumoniae, urine culture Escherichia coli as well as Klebsiella. Cardiac rate remained controlled maintained on low-dose aspirin as prior to admission. He remains on a regular diet. Physical therapy evaluation completed 01/23/2017 with recommendations of physical medicine rehabilitation consult.   Review of Systems  Constitutional: Positive for fever.       Lack of appetite  HENT: Negative for hearing loss and tinnitus.   Eyes: Negative for blurred vision and double vision.  Respiratory: Positive for cough. Negative for shortness of breath.   Cardiovascular: Positive for palpitations and leg swelling. Negative for chest pain.  Gastrointestinal: Positive for constipation. Negative for nausea and vomiting.  Genitourinary: Positive for frequency and urgency. Negative for dysuria and hematuria.  Musculoskeletal: Positive for falls and myalgias.  Skin: Negative for rash.    Neurological: Positive for weakness. Negative for seizures.  All other systems reviewed and are negative.  Past Medical History:  Diagnosis Date  . Aortic stenosis    s/p AVR by Dr Laneta Simmers  . COPD (chronic obstructive pulmonary disease) (HCC)   . History of coronary artery disease    status post stenting of the marginal circumflex in 12/2003 and again in 2009  . Hyperlipidemia   . Morbid obesity (HCC)    weight 243 pounds, BMI 31.2kg/m2, BSA 2.36 square meters  . Obstructive sleep apnea    compliant with CPAP  . Persistent atrial fibrillation [facility])    Past Surgical History:  Procedure Laterality Date  . AORTIC VALVE REPLACEMENT (AVR)/CORONARY ARTERY BYPASS GRAFTING (CABG)   08/05/2011   LIMA to LAD, sequential saphenous vein graft to third and fourth obtuse marginal branches of the circumflex, aortic valve replacement using a 23 mm Edwards pericardial valve  . APPENDECTOMY    . CARDIOVERSION N/A 11/14/2014   Procedure: CARDIOVERSION;  Surgeon: Donato Schultz, MD;  Location: [facility] ENDOSCOPY;  Service: Cardiovascular;  Laterality: N/A;  . CARDIOVERSION N/A 11/20/2016   Procedure: CARDIOVERSION;  Surgeon: Pricilla Riffle, MD;  Location: [facility] ENDOSCOPY;  Service: Cardiovascular;  Laterality: N/A;  . CAROTID ENDARTERECTOMY     Dr Lollie Sails  . CRANIOTOMY Right 12/16/2016   Procedure: CRANIOTOMY HEMATOMA EVACUATION SUBDURAL;  Surgeon: Loura Halt Ditty, MD;  Location: [facility] OR;  Service: Neurosurgery;  Laterality: Right;  . EP IMPLANTABLE DEVICE N/A 11/06/2015   Procedure: PPM Generator Changeout;for sick sinus syndrome with a MDT Adapta L PPM, chronically elevated RV threshold.  . permanent pacemaker     MDT EnRhythm implanted by Dr Lawanda Cousins in [place] for complete heart block with syncope  . REPLACEMENT TOTAL KNEE BILATERAL     2006  . TEE WITHOUT CARDIOVERSION N/A 11/14/2014  Procedure: TRANSESOPHAGEAL ECHOCARDIOGRAM (TEE);  Surgeon: Donato Schultz, MD;  Location: [facility] ENDOSCOPY;  Service:  Cardiovascular;  Laterality: N/A;   Family History  Problem Relation Age of Onset  . Alzheimer's disease Mother   . Tuberculosis Father   . Other Father     Spinal Meningitis   Social History:  reports that he quit smoking about 13 years ago. His smoking use included Cigarettes. He started smoking about 43 years ago. He has a 15.00 pack-year smoking history. He has never used smokeless tobacco. He reports that he drinks alcohol. He reports that he does not use drugs. Allergies: No Known Allergies Medications Prior to Admission  Medication Sig Dispense Refill  . acetaminophen (TYLENOL) 500 MG tablet Take 1,000 mg by mouth every 6 (six) hours as needed.    . ALPRAZolam (XANAX) 0.25 MG tablet Take 1 tablet (0.25 mg total) by mouth at bedtime. 30 tablet 0  . amiodarone (PACERONE) 200 MG tablet Take 1 tablet (200 mg total) by mouth 2 (two) times daily. 60 tablet 1  . aspirin 81 MG tablet Take 81 mg by mouth daily.    Marland Kitchen atorvastatin (LIPITOR) 40 MG tablet Take 1 tablet (40 mg total) by mouth daily. 30 tablet 1  . B Complex Vitamins (VITAMIN B COMPLEX PO) Take 1 tablet by mouth daily.     Marland Kitchen escitalopram (LEXAPRO) 10 MG tablet Take 1 tablet (10 mg total) by mouth at bedtime. 30 tablet 1  . furosemide (LASIX) 20 MG tablet Take 1 tablet ( ) by mouth daily. May take extra tablet for weight gain >3lbs 45 tablet 6  . levETIRAcetam (KEPPRA) 500 MG tablet Take 1 tablet (500 mg total) by mouth 2 (two) times daily. 60 tablet 0  . Magnesium 250 MG TABS Take 1 tablet (250 mg total) by mouth daily.  0  . metoprolol succinate (TOPROL-XL) 25 MG 24 hr tablet Take 0.5 tablets (12.5 mg total) by mouth daily. 30 tablet 1  . naproxen sodium (ANAPROX) 220 MG tablet Take 220 mg by mouth 2 (two) times daily as needed.    . potassium chloride SA (K-DUR,KLOR-CON) 20 MEQ tablet Take 1 tablet (20 mEq total) by mouth daily. 30 tablet 1  . cephALEXin (KEFLEX) 250 MG capsule Take 1 capsule (250 mg total) by mouth every 8  (eight) hours. (Patient not taking: Reported on 01/21/2017) 9 capsule 0    Home: Home Living Family/patient expects to be discharged to:: Inpatient rehab Living Arrangements: Spouse/significant other Available Help at Discharge: Family, Personal care attendant, Available 24 hours/day Type of Home: House Home Access: Stairs to enter [employer] of Steps: 3 Entrance Stairs-Rails: Right Home Layout: One level Bathroom Shower/Tub: Health visitor: Standard Bathroom Accessibility: Yes Home Equipment: Wheelchair - manual, Environmental consultant - 2 wheels  Functional History: Prior Function Level of Independence: Independent with assistive device(s), Needs assistance Gait / Transfers Assistance Needed: Used RW for gait short distances.  Progressively more weak requiring assist until unable to stand. ADL's / Homemaking Assistance Needed: Assist with ADL's Functional Status:  Mobility: Bed Mobility Overal bed mobility: Needs Assistance Bed Mobility: Rolling, Supine to Sit, Sit to Sidelying Rolling: Mod assist Supine to sit: Mod assist Sit to sidelying: Max assist, +2 for physical assistance General bed mobility comments: Patient using bed rail and able to bring LE's off of bed.  Required assist to bring trunk to sitting position.  Patient able to sit EOB with min guard assist.  Required max assist to move to sidelying to  be cleaned - had BM. Transfers Overall transfer level: Needs assistance Equipment used: Rolling walker (2 wheeled) Transfers: Sit to/from Stand Sit to Stand: Max assist, +2 physical assistance General transfer comment: Verbal cues for hand placement.  Provided +2 max assist to attempt to stand.  Required physical assist to get Lt hand onto RW.  Cues to bring hips under body, with physical cuing.  Unable to reach full upright standing.  Returned to sitting and pt had large loose BM.  Moved to sidelying to be cleaned. Ambulation/Gait General Gait Details:  Unable    ADL:    Cognition: Cognition Overall Cognitive Status: Impaired/Different from baseline Orientation Level: Oriented X4 Cognition Arousal/Alertness: Awake/alert Behavior During Therapy: Flat affect Overall Cognitive Status: Impaired/Different from baseline Area of Impairment: Orientation, Attention, Memory, Problem solving, Following commands Orientation Level: Disoriented to, Place, Time, Situation Current Attention Level: Sustained Memory: Decreased short-term memory Following Commands: Follows one step commands inconsistently, Follows one step commands with increased time Problem Solving: Slow processing, Decreased initiation, Difficulty sequencing, Requires verbal cues General Comments: Patient fatigues quickly, with increased lethargy.   Stated he was at [facility], and November.  Blood pressure 97/61, pulse 88, temperature 98 F (36.7 C), temperature source Oral, resp. rate 20, height  (1.88 m), weight 102.6 kg (226 lb 1.6 oz), SpO2 93 %. Physical Exam  Vitals reviewed. Constitutional:  83 year old right-handed male sitting up in bed  HENT:  Craniotomy site well-healed  Eyes:  Pupils round and reactive to light  Neck: Normal range of motion. Neck supple. No thyromegaly present.  Cardiovascular:  Cardiac rate controlled  Respiratory:  Decreased breath sounds at the bases with some upper airway congestion  GI: Soft. Bowel sounds are normal. He exhibits no distension.  Neurological: He is alert.  Makes good eye contact with examiner. He was able to provide his name, age and date of birth. Follows simple commands. He does have limited awareness of his recent hospital stay. RUE 4/5. LUE 3/5 prox to distal with edema. Bilateral LE: 3+HF, KE 4-, ADF/PF 4/5. No gross sensory findings.     No results found for this or any previous visit (from the past 24 hour(s)). Dg Chest Port 1 View  Result Date: 01/25/2017 CLINICAL DATA:  Fever. EXAM: PORTABLE CHEST  1 VIEW COMPARISON:  Chest CT and chest radiographs dated 01/21/2017. FINDINGS: The cardiac silhouette remains borderline enlarged. Stable median sternotomy wires and left subclavian pacemaker leads. Mild increase in patchy interstitial prominence in the mid and lower lung zones bilaterally. No definite pleural fluid seen. Unremarkable bones. IMPRESSION: Mildly progressive bilateral pneumonitis/pneumonia. Electronically Signed   By: Beckie Salts M.D.   On: 01/25/2017 09:05    Assessment/Plan: Diagnosis: Debility related to sepsis in a patient well to known to me after bilateral SDH's. 1. Does the need for close, 24 hr/day medical supervision in concert with the patient's rehab needs make it unreasonable for this patient to be served in a less intensive setting? Yes 2. Co-Morbidities requiring supervision/potential complications: afib, CAD, post TBI issues 3. Due to bladder management, bowel management, safety, skin/wound care, disease management, medication administration, pain management and patient education, does the patient require 24 hr/day rehab nursing? Yes 4. Does the patient require coordinated care of a physician, rehab nurse, PT (1-2 hrs/day, 5 days/week) and OT (1-2 hrs/day, 5 days/week) to address physical and functional deficits in the context of the above medical diagnosis(es)? Yes Addressing deficits in the following areas: balance, endurance, locomotion, strength, transferring, bowel/bladder  control, bathing, dressing, feeding, grooming, toileting and psychosocial support 5. Can the patient actively participate in an intensive therapy program of at least 3 hrs of therapy per day at least 5 days per week? Yes 6. The potential for patient to make measurable gains while on inpatient rehab is good 7. Anticipated functional outcomes upon discharge from inpatient rehab are supervision and min assist  with PT, supervision and min assist with OT, modified independent and supervision with SLP  (+/-). 8. Estimated rehab length of stay to reach the above functional goals is: 9-13 days 9. Does the patient have adequate social supports and living environment to accommodate these discharge functional goals? Yes 10. Anticipated D/C setting: Home 11. Anticipated post D/C treatments: HH therapy and Outpatient therapy 12. Overall Rehab/Functional Prognosis: excellent  RECOMMENDATIONS: This patient's condition is appropriate for continued rehabilitative care in the following setting: CIR Patient has agreed to participate in recommended program. Yes Note that insurance prior authorization may be required for reimbursement for recommended care.  Comment: Rehab Admissions Coordinator to follow up.  Thanks,  [name], MD, [place] [facility]    Charlton Amor., PA-C 01/26/2017

## 2017-01-26 NOTE — Progress Notes (Signed)
qPhysical Therapy Treatment Patient Details Name: Alex [name] MRN: [id number] DOB: 03-28-1931 Today's Date: 01/26/2017    History of Present Illness Patient is an 83 yo male admitted 01/21/17 with sepsis, pna, UTI, AMS, weakness.    PMH:  SDH 11/29/16 with stay on CIR - home on 01/10/17, COPD, Afib, CAD, CABG, OSA on O2 at night, pacemaker, AVR, obesity, HTN, HLD, depression, seizures.    PT Comments    Pt progressing towards goals and able to stand pivot to chair this session. Pt limited by decreased oxygen sats; see vitals flowsheets. Pt sats dropping to 85% on 2L following transfer requiring 3L of oxygen to increase sats to 91% at end of session; RN notified and aware.  Continue to recommend CIR at d/c to increase independence with functional mobility. Will continue to follow to maximize functional mobility independence.   Follow Up Recommendations  CIR;Supervision/Assistance - 24 hour     Equipment Recommendations  None recommended by PT    Recommendations for Other Services Rehab consult;OT consult     Precautions / Restrictions Precautions Precautions: Fall Precaution Comments: Monitor oxygen sats Restrictions Weight Bearing Restrictions: No (Simultaneous filing. User may not have seen previous data.)    Mobility  Bed Mobility Overal bed mobility: Needs Assistance Bed Mobility: Supine to Sit     Supine to sit: Mod assist;+2 for physical assistance;HOB elevated     General bed mobility comments: Verbal cues for sequencing. Use of bed pad for scooting LE to EOB. Heavily relied on bed rails.   Transfers Overall transfer level: Needs assistance Equipment used: Rolling walker (2 wheeled) Transfers: Sit to/from [employer] Sit to Stand: Max assist;+2 physical assistance;From elevated surface Stand pivot transfers: Max assist;+2 physical assistance       General transfer comment: Verbal cues for UE placement prior to transfer. Elevated surface for  sit<>stand transfer. Verbal cues for sequencing throughout with use of RW. Safety cues to wait until he got to chair to sit.  Ambulation/Gait             General Gait Details: Unable   Stairs            Wheelchair Mobility    Modified Rankin (Stroke Patients Only)       Balance Overall balance assessment: Needs assistance Sitting-balance support: Single extremity supported;Feet supported Sitting balance-[name]: Fair Sitting balance - Comments: Maintained static sitting. Sat EOB for ~8 min to work on head and neck extension. Education regarding posture and breathing mechanics.    Standing balance support: Bilateral upper extremity supported;During functional activity Standing balance-[name]: Poor Standing balance comment: Reliant on RW and max A +2 to stand and perform stand pivot transfer.                             Cognition Arousal/Alertness: Awake/alert Behavior During Therapy: Flat affect Overall Cognitive Status: Within Functional Limits for tasks assessed                                        Exercises General Exercises - Lower Extremity Ankle Circles/Pumps: AROM;Both;10 reps;Supine Long Arc Quad: AROM;10 reps;Supine;Both    General Comments General comments (skin integrity, edema, etc.): Pt's wife present throughout session. Pts vitals in vitals flowsheet. Remained above 90% on 2L up until after stand pivot transfer. Sats dropping to 85% on 2L  after transfer, required increase to 3L to elevate back to 91% at completion of session; RN notifed and aware. Education about importance of deep breathing and use of incentive spirometor throughout day to increase pulmonary function.       Pertinent Vitals/Pain Pain Assessment: No/denies pain    Home Living                      Prior Function            PT Goals (current goals can now be found in the care plan section) Acute Rehab PT Goals Patient Stated  Goal: Get stronger and go home PT Goal Formulation: With patient/family Time For Goal Achievement: 02/06/17 Potential to Achieve Goals: Good Progress towards PT goals: Progressing toward goals    Frequency    Min 3X/week      PT Plan Current plan remains appropriate    Co-evaluation             End of Session Equipment Utilized During Treatment: Gait belt;Oxygen Activity Tolerance: Patient limited by fatigue;Treatment limited secondary to medical complications (Comment) (oxygen saturations; see vitals flow sheet) Patient left: in chair;with call bell/phone within reach;with chair alarm set;with family/visitor present Nurse Communication: Mobility status;Other (comment) (need for 3L of oxygen ) PT Visit Diagnosis: Unsteadiness on feet (R26.81);Other abnormalities of gait and mobility (R26.89);Repeated falls (R29.6);Muscle weakness (generalized) (M62.81)     Time: 9562-1308 PT Time Calculation (min) (ACUTE ONLY): 34 min  Charges:  $Therapeutic Activity: 23-37 mins                    G Codes:       Margot Chimes, PT, DPT  Acute Rehabilitation Services  Pager: [phone]    Melvyn Novas 01/26/2017, 11:23 AM

## 2017-01-26 NOTE — Progress Notes (Signed)
Patient's oxygen saturation is at 88 on 2 litters oxygen via nasal canula. Provider notified. New order given to increase 02 as needed to keep sats above 92%. Patient is on 3 liters oxygen at this time. O2 sat is at 92%. Will continue to monitor.

## 2017-01-26 NOTE — Care Management Note (Signed)
Case Management Note  Patient Details  Name: Alex [name] MRN: [id number] Date of Birth: Sep 05, 1931  Subjective/Objective:  Pt admitted with sepsis                  Action/Plan:   Pt is from home with wife.  Previosuly in CIR and discharged home with [facility] , HHPT OT and RN.  Wife is with pt all day but states she is concerned that if pt discharges back home he will digress during the gaps in [facility] days - in agreement with SNF as back up plan.  CSW consulted.     Expected Discharge Date:                  Expected Discharge Plan:  IP Rehab Facility  In-House Referral:  Clinical Social Work  Discharge planning Services  CM Consult  Post Acute Care Choice:    Choice offered to:     DME Arranged:    DME Agency:     HH Arranged:    HH Agency:     Status of Service:     If discussed at [employer], dates discussed:    Additional Comments:  Cherylann Parr, RN 01/26/2017, 10:39 AM

## 2017-01-26 NOTE — Progress Notes (Signed)
Patient ID: Alex [name], male   DOB: Oct 19, 1931, 83 y.o.   MRN: [id number]  PROGRESS NOTE    AVANT PRINTY  WER:[id number] DOB: 1931/01/18 DOA: 01/21/2017  PCP: Derrill Center, MD   Brief Narrative:  83 y.o. male with medical history significant for COPD, A. Fib, CAD s/p CABG, OSA on oxygen at night, s/p PPM, s/p AVR, morbid obesity. He was recently hospitalized in 11/2016 for traumatic subdural hematoma requiring craniotomy. He had prolonged rehabilitation stay for which he was just recently discharged home with 24-hour nursing care 1-1/2 weeks ago PTA. Patient has chronic atrial fibrillation and had an unsuccessful trial of cardioversion on 1/25 with Dr. Harrington Challenger. Following the subdural hematoma patient was taken off of Coumadin and has only been on a baby aspirin daily.   Pt presented to [facility] with generalized weakness for past 3 days prior to this admission. He also was more confused especially in the evenings. Pt reported having subjective fevers, decreased appetite.  In ED, pt had fever of 101.70F, pulse rate 38-81, respirations 17-28, blood pressure as low as 80/42, and O2 sats maintained above 90% on 2 L. Labs revealed normal WBC, hemoglobin 10.7, albumin 1.7, AST 95, ALT 97, lactic acid 1.98, and all other labs relatively unremarkable. Urinalysis showed trace leukocytes. CXR showed bibasilar infiltrates, possible aspiration. No acute findings on CT head. He was started on empiric abx for UTI and pneumonia.  Assessment & Plan:   Principal Problem:   Sepsis secondary to bibasilar pneumonia, possible aspiration pneumonitis / UTI secondary to E.COli and Klebsiella pneumoniae (HCC) / Klebsiella pneumoniae bacteremia  - Sepsis criteria met on admission with fever, tachypnea, tachycardia, hypotension. Source of infection is pneumonia and UTI - Urine cx grew E.Coli and Klebsiella pneumoniae - Blood cx grew Klebsiella pneumoniae - Repeat blood cx 3/31 so far showed no growth  - Resp virus panel  negative  - Vanco and zosyn stopped 3/29 and narrowed to rocephin   Active Problems:   Persistent atrial fibrillation (HCC) - CHADS vasc score at least 3 - Not on coumadin due to subdural hematoma; he is on aspirin 81 mg daily - Rate controlled with metoprolol and amiodarone       Essential hypertension - Continue metoprolol     Dyslipidemia - Continue Lipitor     Depression  - Continue Lexapro     Seizure disorder - Continue Keppra  - Continue xanax at bedtime     DVT prophylaxis: SCD's bilaterally  Code Status: DNR/DNI Family Communication: no family at the bedside this am; called his wife 4/1 Disposition Plan: awaiting to see if pt can go to inpt rehab   Consultants:   Inpatient rehab   Procedures:   None   Antimicrobials:   Vanco and zosyn 3/28 --> 3/29  Rocephin 3/29 -->   Subjective: No overnight events.   Objective: Vitals:   01/26/17 1013 01/26/17 1020 01/26/17 1030 01/26/17 1047  BP: (!) 85/53 95/60 90/60  (!) 99/56  Pulse:      Resp:      Temp:      TempSrc:      SpO2: 91% 90% (!) 85% 91%  Weight:      Height:        Intake/Output Summary (Last 24 hours) at 01/26/17 1315 Last data filed at 01/26/17 1259  Gross per 24 hour  Intake              240 ml  Output  1300 ml  Net            -1060 ml   Filed Weights   01/23/17 0500 01/25/17 0500 01/26/17 0443  Weight: 101.1 kg (222 lb 12.8 oz) 102.2 kg (225 lb 3.2 oz) 102.6 kg (226 lb 1.6 oz)    Examination:  General exam: comfortable  Respiratory system: rhonchi at bases, no wheezing  Cardiovascular system: S1 & S2 heard, rate controlled  Gastrointestinal system: (+) BS, no distention  Central nervous system: Nonfocal  Extremities: no edema, pulses palpable  Skin: warm and dry Psychiatry: Normal mood and behavior  Data Reviewed: I have personally reviewed following labs and imaging studies  CBC:  Recent Labs Lab 01/21/17 1845 01/22/17 9458 01/23/17 0303  01/24/17 0523 01/25/17 0309 01/26/17 0620  WBC 7.4 6.9 8.2 7.8 8.3 8.5  NEUTROABS 5.3  --   --   --   --   --   HGB 10.7* 10.3* 10.6* 10.4* 10.7* 10.1*  HCT 33.2* 32.1* 32.8* 31.9* 32.8* 30.4*  MCV 97.1 97.6 96.2 95.8 95.6 93.8  PLT 188 164 189 207 236 592   Basic Metabolic Panel:  Recent Labs Lab 01/22/17 0243 01/23/17 0303 01/24/17 0523 01/25/17 0309 01/26/17 0620  NA 140 138 138 134* 136  K 4.8 3.9 3.9 3.5 4.0  CL 103 103 103 101 103  CO2 28 26 26 24 24   GLUCOSE 99 102* 88 92 89  BUN 17 17 14 13 14   CREATININE 0.94 0.86 0.74 0.64 0.70  CALCIUM 7.6* 7.7* 7.8* 7.7* 7.6*   GFR: Estimated Creatinine Clearance: 87.9 mL/min (by C-G formula based on SCr of 0.7 mg/dL). Liver Function Tests:  Recent Labs Lab 01/21/17 1845 01/22/17 0243  AST 95* 77*  ALT 97* 87*  ALKPHOS 103 94  BILITOT 0.6 0.8  PROT 5.1* 4.9*  ALBUMIN 1.7* 1.6*   No results for input(s): LIPASE, AMYLASE in the last 168 hours. No results for input(s): AMMONIA in the last 168 hours. Coagulation Profile:  Recent Labs Lab 01/21/17 1845  INR 1.32   Cardiac Enzymes:  Recent Labs Lab 01/22/17 0243 01/22/17 0639  TROPONINI <0.03 <0.03   BNP (last 3 results) No results for input(s): PROBNP in the last 8760 hours. HbA1C: No results for input(s): HGBA1C in the last 72 hours. CBG: No results for input(s): GLUCAP in the last 168 hours. Lipid Profile: No results for input(s): CHOL, HDL, LDLCALC, TRIG, CHOLHDL, LDLDIRECT in the last 72 hours. Thyroid Function Tests: No results for input(s): TSH, T4TOTAL, FREET4, T3FREE, THYROIDAB in the last 72 hours. Anemia Panel: No results for input(s): VITAMINB12, FOLATE, FERRITIN, TIBC, IRON, RETICCTPCT in the last 72 hours. Urine analysis:    Component Value Date/Time   COLORURINE AMBER (A) 01/21/2017 2031   APPEARANCEUR HAZY (A) 01/21/2017 2031   LABSPEC 1.015 01/21/2017 2031   PHURINE 6.0 01/21/2017 2031   GLUCOSEU NEGATIVE 01/21/2017 2031   HGBUR  NEGATIVE 01/21/2017 2031   BILIRUBINUR NEGATIVE 01/21/2017 2031   [place] NEGATIVE 01/21/2017 2031   PROTEINUR 30 (A) 01/21/2017 2031   NITRITE NEGATIVE 01/21/2017 2031   LEUKOCYTESUR TRACE (A) 01/21/2017 2031   Sepsis Labs: @LABRCNTIP (procalcitonin:4,lacticidven:4)   ) Recent Results (from the past 240 hour(s))  Culture, blood (Routine x 2)     Status: None   Collection Time: 01/21/17  8:10 PM  Result Value Ref Range Status   Specimen Description BLOOD LEFT HAND  Final   Special Requests IN PEDIATRIC BOTTLE 4CC  Final   Culture NO  GROWTH 5 DAYS  Final   Report Status 01/26/2017 FINAL  Final  Culture, blood (Routine x 2)     Status: Abnormal   Collection Time: 01/21/17  8:20 PM  Result Value Ref Range Status   Specimen Description BLOOD RIGHT ANTECUBITAL  Final   Special Requests BOTTLES DRAWN AEROBIC AND ANAEROBIC 5CC  Final   Culture  Setup Time   Final    GRAM NEGATIVE RODS AEROBIC BOTTLE ONLY CRITICAL RESULT CALLED TO, READ BACK BY AND VERIFIED WITH: E. MARTIN PHARMD, AT 1415 01/22/17 BY D. VANHOOK    Culture KLEBSIELLA PNEUMONIAE (A)  Final   Report Status 01/24/2017 FINAL  Final   Organism ID, Bacteria KLEBSIELLA PNEUMONIAE  Final      Susceptibility   Klebsiella pneumoniae - MIC*    AMPICILLIN >=32 RESISTANT Resistant     CEFAZOLIN <=4 SENSITIVE Sensitive     CEFEPIME <=1 SENSITIVE Sensitive     CEFTAZIDIME <=1 SENSITIVE Sensitive     CEFTRIAXONE <=1 SENSITIVE Sensitive     CIPROFLOXACIN 1 SENSITIVE Sensitive     GENTAMICIN <=1 SENSITIVE Sensitive     IMIPENEM <=0.25 SENSITIVE Sensitive     TRIMETH/SULFA 40 SENSITIVE Sensitive     AMPICILLIN/SULBACTAM 16 INTERMEDIATE Intermediate     PIP/TAZO 8 SENSITIVE Sensitive     Extended ESBL NEGATIVE Sensitive     * KLEBSIELLA PNEUMONIAE  Blood Culture ID Panel (Reflexed)     Status: Abnormal   Collection Time: 01/21/17  8:20 PM  Result Value Ref Range Status   Enterococcus species NOT DETECTED NOT DETECTED Final    Listeria monocytogenes NOT DETECTED NOT DETECTED Final   Staphylococcus species NOT DETECTED NOT DETECTED Final   Staphylococcus aureus NOT DETECTED NOT DETECTED Final   Streptococcus species NOT DETECTED NOT DETECTED Final   Streptococcus agalactiae NOT DETECTED NOT DETECTED Final   Streptococcus pneumoniae NOT DETECTED NOT DETECTED Final   Streptococcus pyogenes NOT DETECTED NOT DETECTED Final   Acinetobacter baumannii NOT DETECTED NOT DETECTED Final   Enterobacteriaceae species DETECTED (A) NOT DETECTED Final    Comment: Enterobacteriaceae represent a large family of gram-negative bacteria, not a single organism. CRITICAL RESULT CALLED TO, READ BACK BY AND VERIFIED WITH: E. MARTIN PHARMD, AT 8657 01/22/17 BY D. VANHOOK    Enterobacter cloacae complex NOT DETECTED NOT DETECTED Final   Escherichia coli NOT DETECTED NOT DETECTED Final   Klebsiella oxytoca NOT DETECTED NOT DETECTED Final   Klebsiella pneumoniae DETECTED (A) NOT DETECTED Final    Comment: CRITICAL RESULT CALLED TO, READ BACK BY AND VERIFIED WITH: E. MARTIN PHARMD, AT 1415 01/22/17 BY D. VANHOOK    Proteus species NOT DETECTED NOT DETECTED Final   Serratia marcescens NOT DETECTED NOT DETECTED Final   Carbapenem resistance NOT DETECTED NOT DETECTED Final   Haemophilus influenzae NOT DETECTED NOT DETECTED Final   Neisseria meningitidis NOT DETECTED NOT DETECTED Final   Pseudomonas aeruginosa NOT DETECTED NOT DETECTED Final   Candida albicans NOT DETECTED NOT DETECTED Final   Candida glabrata NOT DETECTED NOT DETECTED Final   Candida krusei NOT DETECTED NOT DETECTED Final   Candida parapsilosis NOT DETECTED NOT DETECTED Final   Candida tropicalis NOT DETECTED NOT DETECTED Final  Urine culture     Status: Abnormal   Collection Time: 01/21/17  8:31 PM  Result Value Ref Range Status   Specimen Description URINE, CATHETERIZED  Final   Special Requests NONE  Final   Culture (A)  Final    >=100,000 COLONIES/mL ESCHERICHIA  COLI >=100,000 COLONIES/mL KLEBSIELLA PNEUMONIAE    Report Status 01/24/2017 FINAL  Final   Organism ID, Bacteria ESCHERICHIA COLI (A)  Final   Organism ID, Bacteria KLEBSIELLA PNEUMONIAE (A)  Final      Susceptibility   Escherichia coli - MIC*    AMPICILLIN <=2 SENSITIVE Sensitive     CEFAZOLIN <=4 SENSITIVE Sensitive     CEFTRIAXONE <=1 SENSITIVE Sensitive     CIPROFLOXACIN >=4 RESISTANT Resistant     GENTAMICIN <=1 SENSITIVE Sensitive     IMIPENEM <=0.25 SENSITIVE Sensitive     NITROFURANTOIN <=16 SENSITIVE Sensitive     TRIMETH/SULFA <=20 SENSITIVE Sensitive     AMPICILLIN/SULBACTAM <=2 SENSITIVE Sensitive     PIP/TAZO <=4 SENSITIVE Sensitive     Extended ESBL NEGATIVE Sensitive     * >=100,000 COLONIES/mL ESCHERICHIA COLI   Klebsiella pneumoniae - MIC*    AMPICILLIN >=32 RESISTANT Resistant     CEFAZOLIN <=4 SENSITIVE Sensitive     CEFTRIAXONE <=1 SENSITIVE Sensitive     CIPROFLOXACIN <=0.25 SENSITIVE Sensitive     GENTAMICIN <=1 SENSITIVE Sensitive     IMIPENEM <=0.25 SENSITIVE Sensitive     NITROFURANTOIN 64 INTERMEDIATE Intermediate     TRIMETH/SULFA <=20 SENSITIVE Sensitive     AMPICILLIN/SULBACTAM 4 SENSITIVE Sensitive     PIP/TAZO <=4 SENSITIVE Sensitive     Extended ESBL NEGATIVE Sensitive     * >=100,000 COLONIES/mL KLEBSIELLA PNEUMONIAE  Respiratory Panel by PCR     Status: None   Collection Time: 01/22/17  2:19 AM  Result Value Ref Range Status   Adenovirus NOT DETECTED NOT DETECTED Final   Coronavirus 229E NOT DETECTED NOT DETECTED Final   Coronavirus HKU1 NOT DETECTED NOT DETECTED Final   Coronavirus NL63 NOT DETECTED NOT DETECTED Final   Coronavirus OC43 NOT DETECTED NOT DETECTED Final   Metapneumovirus NOT DETECTED NOT DETECTED Final   Rhinovirus / Enterovirus NOT DETECTED NOT DETECTED Final   Influenza A NOT DETECTED NOT DETECTED Final   Influenza B NOT DETECTED NOT DETECTED Final   Parainfluenza Virus 1 NOT DETECTED NOT DETECTED Final   Parainfluenza  Virus 2 NOT DETECTED NOT DETECTED Final   Parainfluenza Virus 3 NOT DETECTED NOT DETECTED Final   Parainfluenza Virus 4 NOT DETECTED NOT DETECTED Final   Respiratory Syncytial Virus NOT DETECTED NOT DETECTED Final   Bordetella pertussis NOT DETECTED NOT DETECTED Final   Chlamydophila pneumoniae NOT DETECTED NOT DETECTED Final   Mycoplasma pneumoniae NOT DETECTED NOT DETECTED Final  Culture, blood (routine x 2)     Status: None (Preliminary result)   Collection Time: 01/24/17 12:24 PM  Result Value Ref Range Status   Specimen Description BLOOD RIGHT ANTECUBITAL  Final   Special Requests IN PEDIATRIC BOTTLE 3CC  Final   Culture NO GROWTH 2 DAYS  Final   Report Status PENDING  Incomplete  Culture, blood (routine x 2)     Status: None (Preliminary result)   Collection Time: 01/24/17 12:24 PM  Result Value Ref Range Status   Specimen Description BLOOD RIGHT ANTECUBITAL  Final   Special Requests IN PEDIATRIC BOTTLE 2.5CC  Final   Culture NO GROWTH 2 DAYS  Final   Report Status PENDING  Incomplete      Radiology Studies: Dg Chest 2 View Result Date: 01/21/2017 Suspect bibasilar infiltrates/pneumonia.  Possible aspiration.   Ct Head Wo Contrast Result Date: 01/21/2017 1. Some residual chronic subdural fluid is seen overlying the right cerebral convexity though decreased since prior, now measuring up  to 6 mm in thickness versus 14 mm previously. There has been some interval development of right-sided dural thickening which can be seen in this setting since prior. 2. Resolution of pneumocephalus. 3. Interval decrease in right to left midline shift since prior. 4. No acute nor new findings since previous exam. Chronic small vessel ischemic disease of periventricular white matter.   Ct Chest Wo Contrast Result Date: 01/21/2017 1. 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 Subpleural right lower lobe and intraparenchymal left upper lobe ill-defined opacities  are seen which may reflect more confluent areas of pneumonia, atelectasis or postinflammatory change. As neoplasm cannot be entirely excluded, recommend followup short term to assure improvement or resolution. 2. Aortic atherosclerosis.  Status post CABG. Electronically Signed   By: Ashley Royalty M.D.   On: 01/21/2017 21:34    . ALPRAZolam  0.25 mg Oral QHS  . amiodarone  200 mg Oral BID  . aspirin  81 mg Oral Daily  . atorvastatin  40 mg Oral Daily  . cefTRIAXone (ROCEPHIN)  IV  2 g Intravenous Q24H  . escitalopram  10 mg Oral QHS  . furosemide  20 mg Oral Daily  . levETIRAcetam  500 mg Oral BID  . magnesium oxide  200 mg Oral Daily  . mouth rinse  15 mL Mouth Rinse BID  . metoprolol succinate  12.5 mg Oral Daily      Continuous Infusions: . sodium chloride 75 mL/hr at 01/25/17 2117     LOS: 4 days    Time spent: 15 minutes  Greater than 50% of the time spent on counseling and coordinating the care.   Leisa Lenz, MD [facility] Pager [phone]  If 7PM-7AM, please contact night-coverage [email or web address] Password [email or web address] 01/26/2017, 1:15 PM

## 2017-01-27 ENCOUNTER — Encounter (HOSPITAL_COMMUNITY): Payer: Self-pay | Admitting: Radiology

## 2017-01-27 ENCOUNTER — Inpatient Hospital Stay (HOSPITAL_COMMUNITY): Payer: Medicare Other

## 2017-01-27 DIAGNOSIS — R7881 Bacteremia: Secondary | ICD-10-CM

## 2017-01-27 DIAGNOSIS — J189 Pneumonia, unspecified organism: Secondary | ICD-10-CM

## 2017-01-27 DIAGNOSIS — M7989 Other specified soft tissue disorders: Secondary | ICD-10-CM

## 2017-01-27 DIAGNOSIS — S06341A Traumatic hemorrhage of right cerebrum with loss of consciousness of 30 minutes or less, initial encounter: Secondary | ICD-10-CM

## 2017-01-27 MED ORDER — IOPAMIDOL (ISOVUE-300) INJECTION 61%
INTRAVENOUS | Status: AC
  Filled 2017-01-27: qty 30

## 2017-01-27 MED ORDER — IOPAMIDOL (ISOVUE-300) INJECTION 61%
INTRAVENOUS | Status: AC
  Administered 2017-01-27: 100 mL
  Filled 2017-01-27: qty 100

## 2017-01-27 NOTE — Progress Notes (Signed)
qPhysical Therapy Treatment Patient Details Name: Alex [name] MRN: [id number] DOB: 10-26-1931 Today's Date: 01/27/2017    History of Present Illness Patient is an 83 yo male admitted 01/21/17 with sepsis, pna, UTI, AMS, weakness.    PMH:  SDH 11/29/16 with stay on CIR - home on 01/10/17, COPD, Afib, CAD, CABG, OSA on O2 at night, pacemaker, AVR, obesity, HTN, HLD, depression, seizures.    PT Comments    [place] performed with OT this session. Worked on sitting balance activities at EOB and performed stand pivot transfer to chair. Pt requiring from mod A to periods of supervision for sitting balance, and max A +2 for basic transfers. Pt continues to be limited by fatigue and decreased oxygen sats. See vitals flowsheet for details. Continue to recommend CIR for increasing independence and safety with mobility. Will continue to follow to maximize functional mobility independence.    Follow Up Recommendations  CIR;Supervision/Assistance - 24 hour     Equipment Recommendations  None recommended by PT    Recommendations for Other Services Rehab consult     Precautions / Restrictions Precautions Precautions: Fall Precaution Comments: Monitor oxygen sats Restrictions Weight Bearing Restrictions: No    Mobility  Bed Mobility Overal bed mobility: Needs Assistance Bed Mobility: Supine to Sit     Supine to sit: Mod assist;+2 for physical assistance;HOB elevated     General bed mobility comments: Verbal cues for sequencing and use of bed rails for assisting with trunk elevation. Used bed pad to assist with LE movement. Mod A for trunk elevation.   Transfers Overall transfer level: Needs assistance Equipment used: Rolling walker (2 wheeled) Transfers: Sit to/from [employer] Sit to Stand: Max assist;+2 physical assistance;From elevated surface Stand pivot transfers: Max assist;+2 physical assistance       General transfer comment: `  Ambulation/Gait              General Gait Details: Unable   Stairs            Wheelchair Mobility    Modified Rankin (Stroke Patients Only)       Balance Overall balance assessment: Needs assistance Sitting-balance support: Single extremity supported;Feet supported Sitting balance-[name]: Poor Sitting balance - Comments: Max to periods of supervision for sitting balance. Demonstrated multiple LOB sitting EOB requiring assist to maintain upright.    Standing balance support: Bilateral upper extremity supported;During functional activity Standing balance-[name]: Poor Standing balance comment: Reliant on RW and max A +2 to stand and perform stand pivot transfer.                             Cognition Arousal/Alertness: Awake/alert Behavior During Therapy: WFL for tasks assessed/performed Overall Cognitive Status: Within Functional Limits for tasks assessed                                 General Comments: Pt with increased positivity during session and smiling.       Exercises      General Comments General comments (skin integrity, edema, etc.): Pt's oxygen sats monitored throughout session; see vitals flowsheet for details. Pt coached on importance of pursed lip breathing throughout session and encouraged pt to perform incentive spirometry throughout the day to enhance lung function.       Pertinent Vitals/Pain Pain Assessment: Faces Faces Pain Scale: No hurt    Home Living  Prior Function            PT Goals (current goals can now be found in the care plan section) Acute Rehab PT Goals Patient Stated Goal: Get stronger at rehab and go home PT Goal Formulation: With patient/family Time For Goal Achievement: 02/06/17 Potential to Achieve Goals: Good Progress towards PT goals: Progressing toward goals    Frequency    Min 3X/week      PT Plan Current plan remains appropriate    Co-evaluation PT/OT/SLP  Co-Evaluation/Treatment: Yes Reason for Co-Treatment: Complexity of the patient's impairments (multi-system involvement);To address functional/ADL transfers;For patient/therapist safety PT goals addressed during session: Mobility/safety with mobility;Proper use of DME       End of Session Equipment Utilized During Treatment: Gait belt;Oxygen Activity Tolerance: Patient limited by fatigue;Treatment limited secondary to medical complications (Comment) (decreased oxygen sats during transfer) Patient left: in chair;with call bell/phone within reach;with chair alarm set Nurse Communication: Mobility status PT Visit Diagnosis: Unsteadiness on feet (R26.81);Other abnormalities of gait and mobility (R26.89);Repeated falls (R29.6);Muscle weakness (generalized) (M62.81)     Time: 1610-9604 PT Time Calculation (min) (ACUTE ONLY): 28 min  Charges:  $Therapeutic Activity: 8-22 mins                    G Codes:       Margot Chimes, PT, DPT  Acute Rehabilitation Services  Pager: [phone]  Melvyn Novas 01/27/2017, 8:56 AM

## 2017-01-27 NOTE — Progress Notes (Signed)
PHARMACIST - PHYSICIAN COMMUNICATION  CONCERNING:  Ceftriaxone   RECOMMENDATION: Change to po antibiotics to finish course? (Cephalexin)  DESCRIPTION: Day # 7 of IV ceftriaxone  Thank you Okey Regal, PharmD [phone]

## 2017-01-27 NOTE — Progress Notes (Addendum)
Patient ID: Alex [name], male   DOB: November 24, 1930, 83 y.o.   MRN: [id number]  PROGRESS NOTE    Alex [name]  LFY:[id number] DOB: [birth year] DOA: 01/21/2017  PCP: Derrill Center, MD   Brief Narrative:  83 y.o. male with medical history significant for COPD, A. Fib, CAD s/p CABG, OSA on oxygen at night, s/p PPM, s/p AVR, morbid obesity. He was recently hospitalized in 11/2016 for traumatic subdural hematoma requiring craniotomy. He had prolonged rehabilitation stay for which he was just recently discharged home with 24-hour nursing care 1-1/2 weeks ago PTA. Patient has chronic atrial fibrillation and had an unsuccessful trial of cardioversion on 1/25 with Dr. Harrington Challenger. Following the subdural hematoma patient was taken off of Coumadin and has only been on a baby aspirin daily.   Pt presented to [facility] with generalized weakness for past 3 days prior to this admission. He also was more confused especially in the evenings. Pt reported having subjective fevers, decreased appetite.  In ED, pt had fever of 101.64F, pulse rate 38-81, respirations 17-28, blood pressure as low as 80/42, and O2 sats maintained above 90% on 2 L. Labs revealed normal WBC, hemoglobin 10.7, albumin 1.7, AST 95, ALT 97, lactic acid 1.98, and all other labs relatively unremarkable. Urinalysis showed trace leukocytes. CXR showed bibasilar infiltrates, possible aspiration. No acute findings on CT head. He was started on empiric abx for UTI and pneumonia.  Barrier to discharge: spoke with pt daughter over the phone; I have talked to her about the plan of care (obtaining ID consultation, LUE doppler), we also talked about how plan is for continuing to monitor fever curve, not sure why is he still spiking fever. I assured pt daughter that there was no fever yesterday but she said he has fever every single day rectally measured. I also let pt know of my cell# to call me for questions or concerns. She then mentioned to me that I am not working tomorrow  "I looked you up on amion". I still offered her to call me as I am not sure what my schedule will be tomorrow.  Assessment & Plan:   Principal Problem:   Sepsis secondary to bibasilar pneumonia, possible aspiration pneumonitis / UTI secondary to E.COli and Klebsiella pneumoniae (HCC) / Klebsiella pneumoniae bacteremia  - Sepsis criteria met on admission with fever, tachypnea, tachycardia, hypotension. Source of infection is pneumonia and UTI - Urine cx grew E.Coli and Klebsiella pneumoniae - Blood cx grew Klebsiella pneumoniae - Repeat blood cx 3/31 so far showed no growth  - Resp virus panel negative  - Vanco and zosyn stopped 3/29 and we narrowed to rocephin  - Pt spiked fever today, I spoke with ID on call, recommendation was to obtain CT abdomen for further eval  Active Problems:  Left upper extremity swelling - Obtain doppler     Persistent atrial fibrillation (HCC) - CHADS vasc score at least 3 - Not on coumadin due to subdural hematoma; he is on aspirin 81 mg daily - Rate controlled with metoprolol and amiodarone     Chronic diastolic CHF - Compensated - Continue daily lasix       Essential hypertension - Continue metoprolol     Dyslipidemia - Continue Lipitor     Depression  - Continue Lexapro     Seizure disorder - Continue Keppra  - Continue xanax at bedtime     DVT prophylaxis: SCD's bilaterally  Code Status: DNR/DNI Family Communication: no family at the bedside this am;  spoke with pt daughter over the phone today; updated wife over the phone _0 0 ml  Net             -580 ml   Filed Weights   01/25/17 0500 01/26/17 0443 01/27/17 0537  Weight: 102.2 kg (225 lb 3.2 oz) 102.6 kg (226 lb 1.6 oz) 105.6 kg (232 lb 12.9 oz)    Examination:  General exam: comfortable, no distress  Respiratory system: coarse breath sounds, no wheezing  Cardiovascular system: S1 & S2 heard, rate controlled  Gastrointestinal system: (+) BS, nontender, non distended   Central nervous system: Nnofocal  Extremities: no edema, pulses palpable bilaterally  Skin: warm and dry, no ulcers or lesions  Psychiatry: Normal mood and behavior, no agitation or restlessness   Data Reviewed: I have personally reviewed following labs and imaging studies  CBC:  Recent Labs Lab 01/21/17 1845 01/22/17 4128 01/23/17 0303 01/24/17 0523 01/25/17 0309 01/26/17 0620  WBC 7.4 6.9 8.2 7.8 8.3 8.5  NEUTROABS 5.3  --   --   --   --   --   HGB 10.7* 10.3* 10.6* 10.4* 10.7* 10.1*  HCT 33.2* 32.1* 32.8* 31.9* 32.8* 30.4*  MCV 97.1 97.6 96.2 95.8 95.6 93.8  PLT 188 164 189 207 236 208   Basic Metabolic Panel:  Recent Labs Lab 01/22/17 0243 01/23/17 0303 01/24/17 0523 01/25/17 0309 01/26/17 0620  NA 140 138 138 134* 136  K 4.8 3.9 3.9 3.5 4.0  CL 103 103 103 101 103  CO2 _1 GLUCOSE 99 102* 88 92 89  BUN _2 CREATININE 0.94 0.86 0.74 0.64 0.70  CALCIUM 7.6* 7.7* 7.8* 7.7* 7.6*   GFR: Estimated Creatinine Clearance: 89.1 mL/min (by C-G formula based on SCr of 0.7 mg/dL). Liver Function Tests:  Recent Labs Lab 01/21/17 1845 01/22/17 0243  AST 95* 77*  ALT 97* 87*  ALKPHOS 103 94  BILITOT 0.6 0.8  PROT 5.1* 4.9*  ALBUMIN 1.7* 1.6*   No  results for input(s): LIPASE, AMYLASE in the last 168 hours. No results for input(s): AMMONIA in the last 168 hours. Coagulation Profile:  Recent Labs Lab 01/21/17 1845  INR 1.32   Cardiac Enzymes:  Recent Labs Lab 01/22/17 0243 01/22/17 0639  TROPONINI <0.03 <0.03   BNP (last 3 results) No results for input(s): PROBNP in the last 8760 hours. HbA1C: No results for input(s): HGBA1C in the last 72 hours.  CBG: No results for input(s): GLUCAP in the last 168 hours. Lipid Profile: No results for input(s): CHOL, HDL, LDLCALC, TRIG, CHOLHDL, LDLDIRECT in the last 72 hours. Thyroid Function Tests: No results for input(s): TSH, T4TOTAL, FREET4, T3FREE, THYROIDAB in the last 72 hours. Anemia Panel: No results for input(s): VITAMINB12, FOLATE, FERRITIN, TIBC, IRON, RETICCTPCT in the last 72 hours. Urine analysis:    Component Value Date/Time   COLORURINE AMBER (A) 01/21/2017 2031   APPEARANCEUR HAZY (A) 01/21/2017 2031   LABSPEC 1.015 01/21/2017 2031   PHURINE 6.0 01/21/2017 2031   GLUCOSEU NEGATIVE 01/21/2017 2031   HGBUR NEGATIVE 01/21/2017 2031   BILIRUBINUR NEGATIVE 01/21/2017 2031   KETONESUR NEGATIVE 01/21/2017 2031   PROTEINUR 30 (A) 01/21/2017 2031   NITRITE NEGATIVE 01/21/2017 2031   LEUKOCYTESUR TRACE (A) 01/21/2017 2031   Sepsis Labs: _0 (procalcitonin:4,lacticidven:4)   ) Recent Results (from the past 240 hour(s))  Culture, blood (Routine x 2)     Status: None   Collection Time: 01/21/17  8:10 PM  Result Value Ref Range Status   Specimen Description BLOOD LEFT HAND  Final   Special Requests IN PEDIATRIC BOTTLE 4CC  Final   Culture NO GROWTH 5 DAYS  Final   Report Status 01/26/2017 FINAL  Final  Culture, blood (Routine x 2)     Status: Abnormal   Collection Time: 01/21/17  8:20 PM  Result Value Ref Range Status   Specimen Description BLOOD RIGHT ANTECUBITAL  Final   Special Requests BOTTLES DRAWN AEROBIC AND ANAEROBIC 5CC  Final   Culture  Setup  Time   Final    GRAM NEGATIVE RODS AEROBIC BOTTLE ONLY CRITICAL RESULT CALLED TO, READ BACK BY AND VERIFIED WITH: E. MARTIN PHARMD, AT 1415 01/22/17 BY D. VANHOOK    Culture KLEBSIELLA PNEUMONIAE (A)  Final   Report Status 01/24/2017 FINAL  Final   Organism ID, Bacteria KLEBSIELLA PNEUMONIAE  Final      Susceptibility   Klebsiella pneumoniae - MIC*    AMPICILLIN >=32 RESISTANT Resistant     CEFAZOLIN <=4 SENSITIVE Sensitive     CEFEPIME <=1 SENSITIVE Sensitive     CEFTAZIDIME <=1 SENSITIVE Sensitive     CEFTRIAXONE <=1 SENSITIVE Sensitive     CIPROFLOXACIN 1 SENSITIVE Sensitive     GENTAMICIN <=1 SENSITIVE Sensitive     IMIPENEM <=0.25 SENSITIVE Sensitive     TRIMETH/SULFA 40 SENSITIVE Sensitive     AMPICILLIN/SULBACTAM 16 INTERMEDIATE Intermediate     PIP/TAZO 8 SENSITIVE Sensitive     Extended ESBL NEGATIVE Sensitive     * KLEBSIELLA PNEUMONIAE  Blood Culture ID Panel (Reflexed)     Status: Abnormal   Collection Time: 01/21/17  8:20 PM  Result Value Ref Range Status   Enterococcus species NOT DETECTED NOT DETECTED Final   Listeria monocytogenes NOT DETECTED NOT DETECTED Final   Staphylococcus species NOT DETECTED NOT DETECTED Final   Staphylococcus aureus NOT DETECTED NOT DETECTED Final   Streptococcus species NOT DETECTED NOT DETECTED Final   Streptococcus agalactiae NOT DETECTED NOT DETECTED Final   Streptococcus pneumoniae NOT DETECTED NOT DETECTED Final   Streptococcus pyogenes NOT DETECTED NOT DETECTED Final   Acinetobacter baumannii NOT DETECTED NOT DETECTED Final   Enterobacteriaceae species DETECTED (A) NOT DETECTED Final    Comment: Enterobacteriaceae represent a large family of gram-negative bacteria, not a single organism. CRITICAL RESULT CALLED TO, READ BACK BY AND VERIFIED WITH: E. MARTIN PHARMD, AT 1415 01/22/17 BY D. VANHOOK    Enterobacter cloacae complex NOT  DETECTED NOT DETECTED Final   Escherichia coli NOT DETECTED NOT DETECTED Final   Klebsiella oxytoca  NOT DETECTED NOT DETECTED Final   Klebsiella pneumoniae DETECTED (A) NOT DETECTED Final    Comment: CRITICAL RESULT CALLED TO, READ BACK BY AND VERIFIED WITH: E. MARTIN PHARMD, AT 1415 01/22/17 BY D. VANHOOK    Proteus species NOT DETECTED NOT DETECTED Final   Serratia marcescens NOT DETECTED NOT DETECTED Final   Carbapenem resistance NOT DETECTED NOT DETECTED Final   Haemophilus influenzae NOT DETECTED NOT DETECTED Final   Neisseria meningitidis NOT DETECTED NOT DETECTED Final   Pseudomonas aeruginosa NOT DETECTED NOT DETECTED Final   Candida albicans NOT DETECTED NOT DETECTED Final   Candida glabrata NOT DETECTED NOT DETECTED Final   Candida krusei NOT DETECTED NOT DETECTED Final   Candida parapsilosis NOT DETECTED NOT DETECTED Final   Candida tropicalis NOT DETECTED NOT DETECTED Final  Urine culture     Status: Abnormal   Collection Time: 01/21/17  8:31 PM  Result Value Ref Range Status   Specimen Description URINE, CATHETERIZED  Final   Special Requests NONE  Final   Culture (A)  Final    >=100,000 COLONIES/mL ESCHERICHIA COLI >=100,000 COLONIES/mL KLEBSIELLA PNEUMONIAE    Report Status 01/24/2017 FINAL  Final   Organism ID, Bacteria ESCHERICHIA COLI (A)  Final   Organism ID, Bacteria KLEBSIELLA PNEUMONIAE (A)  Final      Susceptibility   Escherichia coli - MIC*    AMPICILLIN <=2 SENSITIVE Sensitive     CEFAZOLIN <=4 SENSITIVE Sensitive     CEFTRIAXONE <=1 SENSITIVE Sensitive     CIPROFLOXACIN >=4 RESISTANT Resistant     GENTAMICIN <=1 SENSITIVE Sensitive     IMIPENEM <=0.25 SENSITIVE Sensitive     NITROFURANTOIN <=16 SENSITIVE Sensitive     TRIMETH/SULFA <=20 SENSITIVE Sensitive     AMPICILLIN/SULBACTAM <=2 SENSITIVE Sensitive     PIP/TAZO <=4 SENSITIVE Sensitive     Extended ESBL NEGATIVE Sensitive     * >=100,000 COLONIES/mL ESCHERICHIA COLI   Klebsiella pneumoniae - MIC*    AMPICILLIN >=32 RESISTANT Resistant     CEFAZOLIN <=4 SENSITIVE Sensitive     CEFTRIAXONE  <=1 SENSITIVE Sensitive     CIPROFLOXACIN <=0.25 SENSITIVE Sensitive     GENTAMICIN <=1 SENSITIVE Sensitive     IMIPENEM <=0.25 SENSITIVE Sensitive     NITROFURANTOIN 64 INTERMEDIATE Intermediate     TRIMETH/SULFA <=20 SENSITIVE Sensitive     AMPICILLIN/SULBACTAM 4 SENSITIVE Sensitive     PIP/TAZO <=4 SENSITIVE Sensitive     Extended ESBL NEGATIVE Sensitive     * >=100,000 COLONIES/mL KLEBSIELLA PNEUMONIAE  Respiratory Panel by PCR     Status: None   Collection Time: 01/22/17  2:19 AM  Result Value Ref Range Status   Adenovirus NOT DETECTED NOT DETECTED Final   Coronavirus 229E NOT DETECTED NOT DETECTED Final   Coronavirus HKU1 NOT DETECTED NOT DETECTED Final   Coronavirus NL63 NOT DETECTED NOT DETECTED Final   Coronavirus OC43 NOT DETECTED NOT DETECTED Final   Metapneumovirus NOT DETECTED NOT DETECTED Final   Rhinovirus / Enterovirus NOT DETECTED NOT DETECTED Final   Influenza A NOT DETECTED NOT DETECTED Final   Influenza B NOT DETECTED NOT DETECTED Final   Parainfluenza Virus 1 NOT DETECTED NOT DETECTED Final   Parainfluenza Virus 2 NOT DETECTED NOT DETECTED Final   Parainfluenza Virus 3 NOT DETECTED NOT DETECTED Final   Parainfluenza Virus 4 NOT DETECTED NOT DETECTED Final   Respiratory Syncytial Virus  NOT DETECTED NOT DETECTED Final   Bordetella pertussis NOT DETECTED NOT DETECTED Final   Chlamydophila pneumoniae NOT DETECTED NOT DETECTED Final   Mycoplasma pneumoniae NOT DETECTED NOT DETECTED Final  Culture, blood (routine x 2)     Status: None (Preliminary result)   Collection Time: 01/24/17 12:24 PM  Result Value Ref Range Status   Specimen Description BLOOD RIGHT ANTECUBITAL  Final   Special Requests IN PEDIATRIC BOTTLE 3CC  Final   Culture NO GROWTH 3 DAYS  Final   Report Status PENDING  Incomplete  Culture, blood (routine x 2)     Status: None (Preliminary result)   Collection Time: 01/24/17 12:24 PM  Result Value Ref Range Status   Specimen Description BLOOD RIGHT  ANTECUBITAL  Final   Special Requests IN PEDIATRIC BOTTLE 2.5CC  Final   Culture NO GROWTH 3 DAYS  Final   Report Status PENDING  Incomplete      Radiology Studies: Dg Chest 2 View Result Date: 01/21/2017 Suspect bibasilar infiltrates/pneumonia.  Possible aspiration.   Ct Head Wo Contrast Result Date: 01/21/2017 1. Some residual chronic subdural fluid is seen overlying the right cerebral convexity though decreased since prior, now measuring up to 6 mm in thickness versus 14 mm previously. There has been some interval development of right-sided dural thickening which can be seen in this setting since prior. 2. Resolution of pneumocephalus. 3. Interval decrease in right to left midline shift since prior. 4. No acute nor new findings since previous exam. Chronic small vessel ischemic disease of periventricular white matter.   Ct Chest Wo Contrast Result Date: 01/21/2017 1. 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 Subpleural right lower lobe and intraparenchymal left upper lobe ill-defined opacities are seen which may reflect more confluent areas of pneumonia, atelectasis or postinflammatory change. As neoplasm cannot be entirely excluded, recommend followup short term to assure improvement or resolution. 2. Aortic atherosclerosis.  Status post CABG. Electronically Signed   By: Ashley Royalty M.D.   On: 01/21/2017 21:34    . ALPRAZolam  0.25 mg Oral QHS  . amiodarone  200 mg Oral BID  . aspirin  81 mg Oral Daily  . atorvastatin  40 mg Oral Daily  . cefTRIAXone (ROCEPHIN)  IV  2 g Intravenous Q24H  . escitalopram  10 mg Oral QHS  . furosemide  20 mg Oral Daily  . levETIRAcetam  500 mg Oral BID  . magnesium oxide  200 mg Oral Daily  . mouth rinse  15 mL Mouth Rinse BID  . metoprolol succinate  12.5 mg Oral Daily      Continuous Infusions: . sodium chloride 75 mL/hr at 01/27/17 0143     LOS: 5 days    Time spent: 15 minutes  Greater than 50% of  the time spent on counseling and coordinating the care.   Leisa Lenz, MD [facility] Pager [phone]  If 7PM-7AM, please contact night-coverage [email or web address] Password [email or web address] 01/27/2017, 1:29 PM

## 2017-01-27 NOTE — Progress Notes (Signed)
Rehab admissions - I have received a denial from [facility] medicare for acute inpatient rehab admission.  I have notified the "adopted daughter" Haynes Bast and she wished to proceed with an expedited appeal.  I gave her the phone number to begin the appeal.  Call me for questions.  #[phone]

## 2017-01-27 NOTE — Consult Note (Signed)
Date of Admission:  01/21/2017  Date of Consult:  01/27/2017  Reason for Consult: fevers Referring Physician: Dr. Charlies Silvers   HPI: Alex [name] is an 83 y.o. male medical history significant of COPD, A. Fib, CAD s/p CABG, OSA on oxygen at night, s/p PPM, s/p AVR, morbid obesity, and traumatic subdural hematoma requiring craniotomy in 11/2016. He had been DC from the hospital after craniotomy for subdural hematoma. He had ultimately DC to home but per daughter (who is Therapist, sports) and wife he had progressive decline in the 10 days prior to admission. He has had progressive weakness, and confusion. In ED he was febrile bradycardic and with hypotension, and SIRS. His CXR and chest CT showed bilateral infiltrates though CT scan also mentioned a spiculated area in the left upper lobe. He did not clearly have urinary symptoms though UA was done on admission with pyuria. Blood was cultured and 1/2 blood cultures have been positive for Klebsiella PNA R to AMP, I to unasyn, otherwise S. Urine cultures have grown mix of Kleb PNA and E coli. He was initially on vancomycin and zosyn but then narrowed appropriately to Ceftriaxone 2 grams daily. Followup blood cultures are NGrowth. He had measured temperatures to 101.6 and 101.4 on 3/31 and 4/2. We were consulted due to concerns about persistent fevers. He has not had abdominal imaging to look for intrabdominal source of infection such as a renal abscess and we discussed risks benefit to such imaging. He does have swelling of the LUE and I ordered a duplex to rule out DVT>   Past Medical History:  Diagnosis Date  . Aortic stenosis    s/p AVR by Dr Cyndia Bent  . COPD (chronic obstructive pulmonary disease) ([place])   . History of coronary artery disease    status post stenting of the marginal circumflex in 12/2003 and again in 2009  . Hyperlipidemia   . Morbid obesity (HCC)    weight 243 pounds, BMI 31.2kg/m2, BSA 2.36 square meters  . Obstructive sleep apnea    compliant with CPAP  . Persistent atrial fibrillation [facility])     Past Surgical History:  Procedure Laterality Date  . AORTIC VALVE REPLACEMENT (AVR)/CORONARY ARTERY BYPASS GRAFTING (CABG)   08/05/2011   LIMA to LAD, sequential saphenous vein graft to third and fourth obtuse marginal branches of the circumflex, aortic valve replacement using a 23 mm Edwards pericardial valve  . APPENDECTOMY    . CARDIOVERSION N/A 11/14/2014   Procedure: CARDIOVERSION;  Surgeon: Candee Furbish, MD;  Location: [facility] ENDOSCOPY;  Service: Cardiovascular;  Laterality: N/A;  . CARDIOVERSION N/A 11/20/2016   Procedure: CARDIOVERSION;  Surgeon: Fay Records, MD;  Location: [place];  Service: Cardiovascular;  Laterality: N/A;  . CAROTID ENDARTERECTOMY     Dr Levi Aland  . CRANIOTOMY Right 12/16/2016   Procedure: CRANIOTOMY HEMATOMA EVACUATION SUBDURAL;  Surgeon: Kevan Ny Ditty, MD;  Location: [place];  Service: Neurosurgery;  Laterality: Right;  . EP IMPLANTABLE DEVICE N/A 11/06/2015   Procedure: PPM Generator Changeout;for sick sinus syndrome with a MDT Adapta L PPM, chronically elevated RV threshold.  . permanent pacemaker     MDT EnRhythm implanted by Dr Sherilyn Banker in [place] for complete heart block with syncope  . REPLACEMENT TOTAL KNEE BILATERAL     2006  . TEE WITHOUT CARDIOVERSION N/A 11/14/2014   Procedure: TRANSESOPHAGEAL ECHOCARDIOGRAM (TEE);  Surgeon: Candee Furbish, MD;  Location: [place];  Service: Cardiovascular;  Laterality: N/A;    Social  History:  reports that he quit smoking about 13 years ago. His smoking use included Cigarettes. He started smoking about 43 years ago. He has a 15.00 pack-year smoking history. He has never used smokeless tobacco. He reports that he drinks alcohol. He reports that he does not use drugs.   Family History  Problem Relation Age of Onset  . Alzheimer's disease Mother   . Tuberculosis Father   . Other Father     Spinal Meningitis    No Known  Allergies   Medications: I have reviewed patients current medications as documented in Epic Anti-infectives    Start     Dose/Rate Route Frequency Ordered Stop   01/22/17 1800  cefTRIAXone (ROCEPHIN) 2 g in dextrose 5 % 50 mL IVPB     2 g 100 mL/hr over 30 Minutes Intravenous Every 24 hours 01/22/17 1456     01/22/17 0900  vancomycin (VANCOCIN) IVPB 1000 mg/200 mL premix  Status:  Discontinued     1,000 mg 200 mL/hr over 60 Minutes Intravenous Every 12 hours 01/21/17 2001 01/22/17 1456   01/22/17 0300  piperacillin-tazobactam (ZOSYN) IVPB 3.375 g  Status:  Discontinued     3.375 g 12.5 mL/hr over 240 Minutes Intravenous Every 8 hours 01/21/17 2001 01/22/17 1456   01/21/17 2000  piperacillin-tazobactam (ZOSYN) IVPB 3.375 g     3.375 g 100 mL/hr over 30 Minutes Intravenous  Once 01/21/17 1955 01/21/17 2238   01/21/17 2000  vancomycin (VANCOCIN) IVPB 1000 mg/200 mL premix  Status:  Discontinued     1,000 mg 200 mL/hr over 60 Minutes Intravenous  Once 01/21/17 1955 01/21/17 1957   01/21/17 2000  vancomycin (VANCOCIN) 1,750 mg in sodium chloride 0.9 % 500 mL IVPB     1,750 mg 250 mL/hr over 120 Minutes Intravenous  Once 01/21/17 1957 01/21/17 2238         ROS: as in HPI otherwise remainder of 12 point Review of Systems is not obtainable due to patient's confusion   Blood pressure 93/71, pulse (!) 115, temperature 97.8 F (36.6 C), temperature source Axillary, resp. rate 20, height 6' 2"  (1.88 m), weight 232 lb 12.9 oz (105.6 kg), SpO2 91 %. General: sleeping though most of my exam with daughter answering most of the questions but he is able to follow commands HEENT: craniotomy site is clean, anicteric sclera,  EOMI, oropharynx clear and without exudate after removing denture. He throat is a little dry Cardiovascular: regular rate, normal r,  no murmur rubs or gallops Pulmonary: fairly clear to auscultation bilaterally anteriorly, no wheezes Gastrointestinal: soft nontender,  nondistended, normal bowel sounds, Musculoskeletal: LUE is more edematous than right Skin, soft tissue: no rashes Neuro: nonfocal, strength and sensation intact   Results for orders placed or performed during the hospital encounter of 01/21/17 (from the past 48 hour(s))  CBC     Status: Abnormal   Collection Time: 01/26/17  6:20 AM  Result Value Ref Range   WBC 8.5 4.0 - 10.5 K/uL   RBC 3.24 (L) 4.22 - 5.81 MIL/uL   Hemoglobin 10.1 (L) 13.0 - 17.0 g/dL   HCT 30.4 (L) 39.0 - 52.0 %   MCV 93.8 78.0 - 100.0 fL   MCH 31.2 26.0 - 34.0 pg   MCHC 33.2 30.0 - 36.0 g/dL   RDW 15.3 11.5 - 15.5 %   Platelets 233 150 - 400 K/uL  Basic metabolic panel     Status: Abnormal   Collection Time: 01/26/17  6:20 AM  Result Value Ref Range   Sodium 136 135 - 145 mmol/L   Potassium 4.0 3.5 - 5.1 mmol/L   Chloride 103 101 - 111 mmol/L   CO2 24 22 - 32 mmol/L   Glucose, Bld 89 65 - 99 mg/dL   BUN 14 6 - 20 mg/dL   Creatinine, Ser 0.70 0.61 - 1.24 mg/dL   Calcium 7.6 (L) 8.9 - 10.3 mg/dL   GFR calc non Af Amer >60 >60 mL/min   GFR calc Af Amer >60 >60 mL/min    Comment: (NOTE) The eGFR has been calculated using the CKD EPI equation. This calculation has not been validated in all clinical situations. eGFR's persistently <60 mL/min signify possible Chronic Kidney Disease.    Anion gap 9 5 - 15   '@BRIEFLABTABLE'$ (sdes,specrequest,cult,reptstatus)   ) Recent Results (from the past 720 hour(s))  Culture, Urine     Status: Abnormal   Collection Time: 01/02/17  1:22 PM  Result Value Ref Range Status   Specimen Description URINE, RANDOM  Final   Special Requests NONE  Final   Culture (A)  Final    >=100,000 COLONIES/mL ESCHERICHIA COLI 50,000 COLONIES/mL KLEBSIELLA PNEUMONIAE    Report Status 01/05/2017 FINAL  Final   Organism ID, Bacteria ESCHERICHIA COLI (A)  Final   Organism ID, Bacteria KLEBSIELLA PNEUMONIAE (A)  Final      Susceptibility   Escherichia coli - MIC*    AMPICILLIN 4  SENSITIVE Sensitive     CEFAZOLIN <=4 SENSITIVE Sensitive     CEFTRIAXONE <=1 SENSITIVE Sensitive     CIPROFLOXACIN >=4 RESISTANT Resistant     GENTAMICIN <=1 SENSITIVE Sensitive     IMIPENEM <=0.25 SENSITIVE Sensitive     NITROFURANTOIN <=16 SENSITIVE Sensitive     TRIMETH/SULFA <=20 SENSITIVE Sensitive     AMPICILLIN/SULBACTAM <=2 SENSITIVE Sensitive     PIP/TAZO <=4 SENSITIVE Sensitive     Extended ESBL NEGATIVE Sensitive     * >=100,000 COLONIES/mL ESCHERICHIA COLI   Klebsiella pneumoniae - MIC*    AMPICILLIN >=32 RESISTANT Resistant     CEFAZOLIN <=4 SENSITIVE Sensitive     CEFTRIAXONE <=1 SENSITIVE Sensitive     CIPROFLOXACIN 1 SENSITIVE Sensitive     GENTAMICIN <=1 SENSITIVE Sensitive     IMIPENEM <=0.25 SENSITIVE Sensitive     NITROFURANTOIN 256 RESISTANT Resistant     TRIMETH/SULFA 40 SENSITIVE Sensitive     AMPICILLIN/SULBACTAM 16 INTERMEDIATE Intermediate     PIP/TAZO 16 SENSITIVE Sensitive     Extended ESBL NEGATIVE Sensitive     * 50,000 COLONIES/mL KLEBSIELLA PNEUMONIAE  Culture, blood (Routine x 2)     Status: None   Collection Time: 01/21/17  8:10 PM  Result Value Ref Range Status   Specimen Description BLOOD LEFT HAND  Final   Special Requests IN PEDIATRIC BOTTLE 4CC  Final   Culture NO GROWTH 5 DAYS  Final   Report Status 01/26/2017 FINAL  Final  Culture, blood (Routine x 2)     Status: Abnormal   Collection Time: 01/21/17  8:20 PM  Result Value Ref Range Status   Specimen Description BLOOD RIGHT ANTECUBITAL  Final   Special Requests BOTTLES DRAWN AEROBIC AND ANAEROBIC 5CC  Final   Culture  Setup Time   Final    GRAM NEGATIVE RODS AEROBIC BOTTLE ONLY CRITICAL RESULT CALLED TO, READ BACK BY AND VERIFIED WITH: E. MARTIN PHARMD, AT 1415 01/22/17 BY D. VANHOOK    Culture KLEBSIELLA PNEUMONIAE (A)  Final   Report Status  01/24/2017 FINAL  Final   Organism ID, Bacteria KLEBSIELLA PNEUMONIAE  Final      Susceptibility   Klebsiella pneumoniae - MIC*     AMPICILLIN >=32 RESISTANT Resistant     CEFAZOLIN <=4 SENSITIVE Sensitive     CEFEPIME <=1 SENSITIVE Sensitive     CEFTAZIDIME <=1 SENSITIVE Sensitive     CEFTRIAXONE <=1 SENSITIVE Sensitive     CIPROFLOXACIN 1 SENSITIVE Sensitive     GENTAMICIN <=1 SENSITIVE Sensitive     IMIPENEM <=0.25 SENSITIVE Sensitive     TRIMETH/SULFA 40 SENSITIVE Sensitive     AMPICILLIN/SULBACTAM 16 INTERMEDIATE Intermediate     PIP/TAZO 8 SENSITIVE Sensitive     Extended ESBL NEGATIVE Sensitive     * KLEBSIELLA PNEUMONIAE  Blood Culture ID Panel (Reflexed)     Status: Abnormal   Collection Time: 01/21/17  8:20 PM  Result Value Ref Range Status   Enterococcus species NOT DETECTED NOT DETECTED Final   Listeria monocytogenes NOT DETECTED NOT DETECTED Final   Staphylococcus species NOT DETECTED NOT DETECTED Final   Staphylococcus aureus NOT DETECTED NOT DETECTED Final   Streptococcus species NOT DETECTED NOT DETECTED Final   Streptococcus agalactiae NOT DETECTED NOT DETECTED Final   Streptococcus pneumoniae NOT DETECTED NOT DETECTED Final   Streptococcus pyogenes NOT DETECTED NOT DETECTED Final   Acinetobacter baumannii NOT DETECTED NOT DETECTED Final   Enterobacteriaceae species DETECTED (A) NOT DETECTED Final    Comment: Enterobacteriaceae represent a large family of gram-negative bacteria, not a single organism. CRITICAL RESULT CALLED TO, READ BACK BY AND VERIFIED WITH: E. MARTIN PHARMD, AT 6568 01/22/17 BY D. VANHOOK    Enterobacter cloacae complex NOT DETECTED NOT DETECTED Final   Escherichia coli NOT DETECTED NOT DETECTED Final   Klebsiella oxytoca NOT DETECTED NOT DETECTED Final   Klebsiella pneumoniae DETECTED (A) NOT DETECTED Final    Comment: CRITICAL RESULT CALLED TO, READ BACK BY AND VERIFIED WITH: E. MARTIN PHARMD, AT 1415 01/22/17 BY D. VANHOOK    Proteus species NOT DETECTED NOT DETECTED Final   Serratia marcescens NOT DETECTED NOT DETECTED Final   Carbapenem resistance NOT DETECTED NOT  DETECTED Final   Haemophilus influenzae NOT DETECTED NOT DETECTED Final   Neisseria meningitidis NOT DETECTED NOT DETECTED Final   Pseudomonas aeruginosa NOT DETECTED NOT DETECTED Final   Candida albicans NOT DETECTED NOT DETECTED Final   Candida glabrata NOT DETECTED NOT DETECTED Final   Candida krusei NOT DETECTED NOT DETECTED Final   Candida parapsilosis NOT DETECTED NOT DETECTED Final   Candida tropicalis NOT DETECTED NOT DETECTED Final  Urine culture     Status: Abnormal   Collection Time: 01/21/17  8:31 PM  Result Value Ref Range Status   Specimen Description URINE, CATHETERIZED  Final   Special Requests NONE  Final   Culture (A)  Final    >=100,000 COLONIES/mL ESCHERICHIA COLI >=100,000 COLONIES/mL KLEBSIELLA PNEUMONIAE    Report Status 01/24/2017 FINAL  Final   Organism ID, Bacteria ESCHERICHIA COLI (A)  Final   Organism ID, Bacteria KLEBSIELLA PNEUMONIAE (A)  Final      Susceptibility   Escherichia coli - MIC*    AMPICILLIN <=2 SENSITIVE Sensitive     CEFAZOLIN <=4 SENSITIVE Sensitive     CEFTRIAXONE <=1 SENSITIVE Sensitive     CIPROFLOXACIN >=4 RESISTANT Resistant     GENTAMICIN <=1 SENSITIVE Sensitive     IMIPENEM <=0.25 SENSITIVE Sensitive     NITROFURANTOIN <=16 SENSITIVE Sensitive     TRIMETH/SULFA <=20 SENSITIVE Sensitive  AMPICILLIN/SULBACTAM <=2 SENSITIVE Sensitive     PIP/TAZO <=4 SENSITIVE Sensitive     Extended ESBL NEGATIVE Sensitive     * >=100,000 COLONIES/mL ESCHERICHIA COLI   Klebsiella pneumoniae - MIC*    AMPICILLIN >=32 RESISTANT Resistant     CEFAZOLIN <=4 SENSITIVE Sensitive     CEFTRIAXONE <=1 SENSITIVE Sensitive     CIPROFLOXACIN <=0.25 SENSITIVE Sensitive     GENTAMICIN <=1 SENSITIVE Sensitive     IMIPENEM <=0.25 SENSITIVE Sensitive     NITROFURANTOIN 64 INTERMEDIATE Intermediate     TRIMETH/SULFA <=20 SENSITIVE Sensitive     AMPICILLIN/SULBACTAM 4 SENSITIVE Sensitive     PIP/TAZO <=4 SENSITIVE Sensitive     Extended ESBL NEGATIVE  Sensitive     * >=100,000 COLONIES/mL KLEBSIELLA PNEUMONIAE  Respiratory Panel by PCR     Status: None   Collection Time: 01/22/17  2:19 AM  Result Value Ref Range Status   Adenovirus NOT DETECTED NOT DETECTED Final   Coronavirus 229E NOT DETECTED NOT DETECTED Final   Coronavirus HKU1 NOT DETECTED NOT DETECTED Final   Coronavirus NL63 NOT DETECTED NOT DETECTED Final   Coronavirus OC43 NOT DETECTED NOT DETECTED Final   Metapneumovirus NOT DETECTED NOT DETECTED Final   Rhinovirus / Enterovirus NOT DETECTED NOT DETECTED Final   Influenza A NOT DETECTED NOT DETECTED Final   Influenza B NOT DETECTED NOT DETECTED Final   Parainfluenza Virus 1 NOT DETECTED NOT DETECTED Final   Parainfluenza Virus 2 NOT DETECTED NOT DETECTED Final   Parainfluenza Virus 3 NOT DETECTED NOT DETECTED Final   Parainfluenza Virus 4 NOT DETECTED NOT DETECTED Final   Respiratory Syncytial Virus NOT DETECTED NOT DETECTED Final   Bordetella pertussis NOT DETECTED NOT DETECTED Final   Chlamydophila pneumoniae NOT DETECTED NOT DETECTED Final   Mycoplasma pneumoniae NOT DETECTED NOT DETECTED Final  Culture, blood (routine x 2)     Status: None (Preliminary result)   Collection Time: 01/24/17 12:24 PM  Result Value Ref Range Status   Specimen Description BLOOD RIGHT ANTECUBITAL  Final   Special Requests IN PEDIATRIC BOTTLE 3CC  Final   Culture NO GROWTH 3 DAYS  Final   Report Status PENDING  Incomplete  Culture, blood (routine x 2)     Status: None (Preliminary result)   Collection Time: 01/24/17 12:24 PM  Result Value Ref Range Status   Specimen Description BLOOD RIGHT ANTECUBITAL  Final   Special Requests IN PEDIATRIC BOTTLE 2.5CC  Final   Culture NO GROWTH 3 DAYS  Final   Report Status PENDING  Incomplete     Impression/Recommendation  Principal Problem:   Sepsis ([place]) Active Problems:   3-vessel coronary artery disease   Hyperlipidemia, mixed   Persistent atrial fibrillation (HCC)   Atrial fibrillation  (HCC)   Transient hypotension   HCAP (healthcare-associated pneumonia)   KAWHI DIEBOLD is a 83 y.o. male with  With complicated PMHX including COPD, A. Fib, CAD s/p CABG, OSA on oxygen at night, s/p PPM, s/p AVR, morbid obesity, and traumatic subdural hematoma requiring craniotomy in 11/2016 now with admission with fever, SIRS, evidence of pneumonia and with 1/2 blood cultures positive for Klebsiella PNA on Ceftriaxone but with fevers  #1 Fevers:  After extensive discussion family and wife specifically decided against CT of abdomen with oral and IV contrast today given concerns about risk of renal toxicity of IV contrast  Wife did want to do duplex which I have ordered (though he cannot be anticoagulated with his recent IC bleed)  Will reasess tomorrow and consider CT abdomen and pelvis with oral +/- IV contrast  Would consider DC lasix to ensure hydration (he is also receiving 75 cc/hour of NS)  I will order hep panel and HIV  #2 Kleb PNA bacteremia: in 1/2 blood cultures and not a common pathogen for PVE or PM infection  Continue CTX  #3 PNA: continue CTX  #4 Goals of care: encourage further discussions.    01/27/2017, 5:02 PM   Thank you so much for this interesting consult  [facility] [place] for [place] [phone] (pager) [phone] (office) 01/27/2017, 5:02 PM  Rhina [place] 01/27/2017, 5:02 PM

## 2017-01-27 NOTE — Evaluation (Addendum)
Occupational Therapy Evaluation Patient Details Name: Alex [name] MRN: [id number] DOB: [birth year] Today's Date: 01/27/2017    History of Present Illness Patient is an 83 yo male admitted 01/21/17 with sepsis, pna, UTI, AMS, weakness.    PMH:  SDH 11/29/16 with stay on CIR - home on 01/10/17, COPD, Afib, CAD, CABG, OSA on O2 at night, pacemaker, AVR, obesity, HTN, HLD, depression, seizures.   Clinical Impression   PTA, pt had 24 hour assistance from personal care attendant but he reports that he was completing dressing and grooming tasks on his own. Pt currently requires max assist +2 for stand-pivot toilet transfers, max assist for LB ADL, and min assist for grooming tasks seated at EOB. Pt presents with L UE edema, decreased problem solving skills, and decreased balance impacting ability to participate with ADL at PLOF. Pt additionally with O2 desaturation with activity on 3L O2 but rebounded with encouragement for pursed lip breathing techniques (see vitals flowsheet for details of oxygen saturation). Recommend CIR placement for continued rehabilitation in order to maximize independence with ADL and functional mobility. OT will continue to follow while admitted to progress independence with ADL.    Follow Up Recommendations  CIR;Supervision/Assistance - 24 hour    Equipment Recommendations  Other (comment) (TBD at next venue)    Recommendations for Other Services Rehab consult     Precautions / Restrictions Precautions Precautions: Fall Precaution Comments: Monitor oxygen sats Restrictions Weight Bearing Restrictions: No      Mobility Bed Mobility Overal bed mobility: Needs Assistance Bed Mobility: Supine to Sit     Supine to sit: Mod assist;+2 for physical assistance;HOB elevated     General bed mobility comments: Verbal cues for sequencing and use of bed rails for assisting with trunk elevation. Used bed pad to assist with LE movement. Mod A for trunk elevation.    Transfers Overall transfer level: Needs assistance Equipment used: Rolling walker (2 wheeled) Transfers: Sit to/from [employer] Sit to Stand: Max assist;+2 physical assistance;From elevated surface Stand pivot transfers: Max assist;+2 physical assistance       General transfer comment: VC's for safe UE placement and sequencing for use of RW.    Balance Overall balance assessment: Needs assistance Sitting-balance support: Single extremity supported;Feet supported Sitting balance-[name]: Poor Sitting balance - Comments: Max to periods of supervision for sitting balance. Demonstrated multiple LOB sitting EOB requiring assist to maintain upright.  Postural control: Left lateral lean Standing balance support: Bilateral upper extremity supported;During functional activity Standing balance-[name]: Poor Standing balance comment: Reliant on RW and max A +2 to stand and perform stand pivot transfer.                            ADL either performed or assessed with clinical judgement   ADL Overall ADL's : Needs assistance/impaired Eating/Feeding: Set up;Sitting   Grooming: Wash/dry face;Minimal assistance;Sitting Grooming Details (indicate cue type and reason): Pt washed face after verbal cues for initiation and min assist for balance seated at EOB Upper Body Bathing: Minimal assistance;Sitting   Lower Body Bathing: Sit to/from stand;Maximal assistance   Upper Body Dressing : Minimal assistance;Sitting   Lower Body Dressing: Maximal assistance;Sit to/from stand   Toilet Transfer: Maximal assistance;+2 for safety/equipment;Stand-pivot;RW   Toileting- Clothing Manipulation and Hygiene: Maximal assistance;Sit to/from stand       Functional mobility during ADLs: Maximal assistance;+2 for physical assistance General ADL Comments: Pt tolerated sitting at EOB with max  assist initially and up to min with periods of supervision for ADL for  approximately 10 minutes on 3L O2 via nasal canula and required pursed lip breathing education to maintain SpO2 at 90%. This fluctuated from 85-91%.     Vision Baseline Vision/History: Wears glasses Patient Visual Report: No change from baseline Additional Comments: Reports no changes in vision. Pt able to track well and identify items throughout visual field. Will continue to assess.     Perception     Praxis      Pertinent Vitals/Pain Pain Assessment: Faces Faces Pain Scale: No hurt     Hand Dominance Right   Extremity/Trunk Assessment Upper Extremity Assessment Upper Extremity Assessment: LUE deficits/detail;Generalized weakness LUE Deficits / Details: Edema in L UE but per PT this has improved from yesterday. AROM WFL in L UE.   Lower Extremity Assessment Lower Extremity Assessment: Generalized weakness       Communication Communication Communication: No difficulties   Cognition Arousal/Alertness: Awake/alert Behavior During Therapy: WFL for tasks assessed/performed Overall Cognitive Status: Within Functional Limits for tasks assessed Area of Impairment: Attention;Problem solving;Following commands;Safety/judgement                   Current Attention Level: Sustained   Following Commands: Follows one step commands with increased time Safety/Judgement: Decreased awareness of safety;Decreased awareness of deficits   Problem Solving: Slow processing;Decreased initiation;Difficulty sequencing;Requires verbal cues General Comments: Pt smiling and pleasantly participating with therapy this session.   General Comments  Pt's oxygen sats monitored throughout session; see vitals flowsheet for details. Pt coached on importance of pursed lip breathing throughout session and encouraged pt to perform incentive spirometry throughout the day to enhance lung function.     Exercises     Shoulder Instructions      Home Living Family/patient expects to be discharged to::  Inpatient rehab Living Arrangements: Spouse/significant other Available Help at Discharge: Family;Personal care attendant;Available 24 hours/day Type of Home: House Home Access: Stairs to enter [employer] of Steps: 3 Entrance Stairs-Rails: Right Home Layout: One level     Bathroom Shower/Tub: Producer, television/film/video: Standard Bathroom Accessibility: Yes How Accessible: Accessible via walker Home Equipment: Wheelchair - [employer] - 2 wheels          Prior Functioning/Environment Level of Independence: Needs assistance    ADL's / Homemaking Assistance Needed: Assist with ADL's   Comments: Has 24 hour nursing assistance per chart, but reports that he was completing basic ADL independently.         OT Problem List: Decreased strength;Decreased range of motion;Decreased activity tolerance;Impaired balance (sitting and/or standing);Decreased safety awareness;Decreased knowledge of use of DME or AE;Decreased knowledge of precautions;Impaired UE functional use;Cardiopulmonary status limiting activity      OT Treatment/Interventions: Self-care/ADL training;Therapeutic exercise;Neuromuscular education;Energy conservation;DME and/or AE instruction;Therapeutic activities;Visual/perceptual remediation/compensation;Cognitive remediation/compensation;Patient/family education;Balance training    OT Goals(Current goals can be found in the care plan section) Acute Rehab OT Goals Patient Stated Goal: Get stronger at rehab and go home OT Goal Formulation: With patient/family Time For Goal Achievement: 02/10/17 Potential to Achieve Goals: Good ADL Goals Pt Will Perform Grooming: standing;with min guard assist (2 tasks) Pt Will Perform Lower Body Bathing: with min guard assist;sit to/from stand Pt Will Perform Lower Body Dressing: with min guard assist;sit to/from stand Pt Will Transfer to Toilet: with min guard assist;ambulating;bedside commode (with RW) Pt Will  Perform Toileting - Clothing Manipulation and hygiene: with min guard assist;sit to/from stand Additional ADL Goal #1: Pt  will complete morning ADL routine with no more than 1 VC for sequencing.  OT Frequency: Min 3X/week   Barriers to D/C:            Co-evaluation PT/OT/SLP Co-Evaluation/Treatment: Yes Reason for Co-Treatment: Complexity of the patient's impairments (multi-system involvement);For patient/therapist safety;To address functional/ADL transfers PT goals addressed during session: Mobility/safety with mobility;Proper use of DME OT goals addressed during session: ADL's and self-care      End of Session Equipment Utilized During Treatment: Gait belt;Rolling walker Nurse Communication: Mobility status  Activity Tolerance: Patient tolerated treatment well Patient left: in chair;with call bell/phone within reach;with chair alarm set  OT Visit Diagnosis: Muscle weakness (generalized) (M62.81);Hemiplegia and hemiparesis;Unsteadiness on feet (R26.81)                Time: [id number] Calculation (min): 27 min Charges:  OT General Charges $OT Visit: 1 Procedure OT Evaluation $OT Eval Moderate Complexity: 1 Procedure G-Codes:     Doristine Section, MS OTR/L  Pager: [phone]   Leyton Magoon A Cherilynn Schomburg 01/27/2017, 9:25 AM

## 2017-01-27 NOTE — Care Management Important Message (Signed)
Important Message  Patient Details  Name: Alex [name] MRN: [id number] Date of Birth: 09/24/1931   Medicare Important Message Given:  Yes    Dorena Bodo 01/27/2017, 2:23 PM

## 2017-01-27 NOTE — Consult Note (Signed)
Consultation Note Date: 01/27/2017   Patient Name: Alex [name]  DOB: [birth year]  MRN: [id number]  Age / Sex: 83 y.o., male  PCP: Derrill Center, MD Referring Physician: Robbie Lis, MD  Reason for Consultation: Establishing goals of care  HPI/Patient Profile: 83 y.o. male  with past medical history of COPD, A. Fib, CAD s/p CABG, OSA on oxygen at night, s/p PPM, s/p AVR,morbid obesity. He was recently hospitalized in 11/2016 for traumatic subdural hematoma requiring craniotomy. He had prolonged rehabilitation stay from which he was discharged home with 24-hour nursing care approx 2 weeks ago, now admitted on 01/21/2017 with sepsis, UTI, PNA, and klebsiella bacteremia.  Palliative consulted for goals of care.   Clinical Assessment and Goals of Care: I met today with Mr. Jentz, his wife [place], his grandson, and family friends Margaretmary Eddy (liaison from [facility]), Dr. Levi Aland (Surgeon, [place] at [facility]), and Ms. Jimmye Norman).  Mr. Hunt reports that the most important things to him are his family and being at home.  He reports having a good understanding of his medical condition.  We reviewed his clinical course over the past 2 months, including recent subdural hematoma, subsequent craniotomy, stay at CIR, return home, and significant decline he has had since that time.  We discussed pathways moving forward, including return to CIR (if possible), short term rehab at SNF, return home with home health, or return home with plan for comfort there and not to return to hospital.    Mr. Quintanar is his own decision maker.   SUMMARY OF RECOMMENDATIONS   - He is hopeful to return to CIR for further rehab.  He did well with CIR and would be well served to return for further therapy if possible. Ultimate goal to return home - He is agreeable to short term SNF stay for rehab if he cannot return to CIR.  Family reports  that they are only open to certain facilities. - We had discussion that his overall condition has declined with decrease in his functional status.  We discussed that the hospital can be useful as long as he is getting well enough from care he receives at the hospital to enjoy his time at home, but if he continues to decline, there may come a time in the near future where, if his goal is to be at home, he may be better served to plan on being at home and bringing care to him at home rather repeated trips to the hospital.  - I am going off service, but will ask another member of PMT to follow up to support holistically  Code Status/Advance Care Planning:  DNR  Palliative Prophylaxis:   Aspiration and Delirium Protocol  Additional Recommendations (Limitations, Scope, Preferences):  Avoid ICU  Psycho-social/Spiritual:   Desire for further Chaplaincy support:Did not address this visit  Additional Recommendations: Caregiving  Support/Resources  Prognosis:   Unable to determine  Discharge Planning: CIR vs SNF for rehab      Primary Diagnoses: Present on Admission: .  Sepsis ([place]) . Transient hypotension . Persistent atrial fibrillation ([place]) . Atrial fibrillation ([place]) . 3-vessel coronary artery disease . Hyperlipidemia, mixed   I have reviewed the medical record, interviewed the patient and family, and examined the patient. The following aspects are pertinent.  Past Medical History:  Diagnosis Date  . Aortic stenosis    s/p AVR by Dr Cyndia Bent  . COPD (chronic obstructive pulmonary disease) ([place])   . History of coronary artery disease    status post stenting of the marginal circumflex in 12/2003 and again in 2009  . Hyperlipidemia   . Morbid obesity ([facility])    weight 243 pounds, BMI 31.2kg/m2, BSA 2.36 square meters  . Obstructive sleep apnea    compliant with CPAP  . Persistent atrial fibrillation [facility])    Social History   Social History  . Marital status: Married     Spouse name: N/A  . Number of children: N/A  . Years of education: N/A   Social History Main Topics  . Smoking status: Former Smoker    Packs/day: 0.50    Years: 30.00    Types: Cigarettes    Start date: 10/27/1973    Quit date: 10/28/2003  . Smokeless tobacco: Never Used  . Alcohol use 0.0 oz/week     Comment: 8 oz of wine per night  . Drug use: No  . Sexual activity: Not Asked   Other Topics Concern  . None   Social History Narrative   Lives in [place] with spouse.  Owns a Scientist, research (medical).   Family History  Problem Relation Age of Onset  . Alzheimer's disease Mother   . Tuberculosis Father   . Other Father     Spinal Meningitis   Scheduled Meds: . ALPRAZolam  0.25 mg Oral QHS  . amiodarone  200 mg Oral BID  . aspirin  81 mg Oral Daily  . atorvastatin  40 mg Oral Daily  . cefTRIAXone (ROCEPHIN)  IV  2 g Intravenous Q24H  . escitalopram  10 mg Oral QHS  . furosemide  20 mg Oral Daily  . levETIRAcetam  500 mg Oral BID  . magnesium oxide  200 mg Oral Daily  . mouth rinse  15 mL Mouth Rinse BID  . metoprolol succinate  12.5 mg Oral Daily   Continuous Infusions: . sodium chloride 75 mL/hr at 01/27/17 0143   PRN Meds:.acetaminophen Medications Prior to Admission:  Prior to Admission medications   Medication Sig Start Date End Date Taking? Authorizing Provider  acetaminophen (TYLENOL) 500 MG tablet Take 1,000 mg by mouth every 6 (six) hours as needed.   Yes Historical Provider, MD  ALPRAZolam (XANAX) 0.25 MG tablet Take 1 tablet (0.25 mg total) by mouth at bedtime. 01/09/17  Yes Daniel J Angiulli, PA-C  amiodarone (PACERONE) 200 MG tablet Take 1 tablet (200 mg total) by mouth 2 (two) times daily. 01/09/17  Yes Daniel J Angiulli, PA-C  aspirin 81 MG tablet Take 81 mg by mouth daily.   Yes Historical Provider, MD  atorvastatin (LIPITOR) 40 MG tablet Take 1 tablet (40 mg total) by mouth daily. 01/10/17  Yes Daniel J Angiulli, PA-C  B Complex Vitamins (VITAMIN  B COMPLEX PO) Take 1 tablet by mouth daily.    Yes Historical Provider, MD  escitalopram (LEXAPRO) 10 MG tablet Take 1 tablet (10 mg total) by mouth at bedtime. 01/09/17  Yes Daniel J Angiulli, PA-C  furosemide (LASIX) 20 MG tablet Take 1 tablet (58m) by mouth daily.  May take extra tablet for weight gain >3lbs 01/09/17  Yes Daniel J Angiulli, PA-C  levETIRAcetam (KEPPRA) 500 MG tablet Take 1 tablet (500 mg total) by mouth 2 (two) times daily. 01/09/17  Yes Daniel J Angiulli, PA-C  Magnesium 250 MG TABS Take 1 tablet (250 mg total) by mouth daily. 11/04/16  Yes Sherran Needs, NP  metoprolol succinate (TOPROL-XL) 25 MG 24 hr tablet Take 0.5 tablets (12.5 mg total) by mouth daily. 01/10/17  Yes Daniel J Angiulli, PA-C  naproxen sodium (ANAPROX) 220 MG tablet Take 220 mg by mouth 2 (two) times daily as needed.   Yes Historical Provider, MD  potassium chloride SA (K-DUR,KLOR-CON) 20 MEQ tablet Take 1 tablet (20 mEq total) by mouth daily. 01/10/17  Yes Daniel J Angiulli, PA-C  cephALEXin (KEFLEX) 250 MG capsule Take 1 capsule (250 mg total) by mouth every 8 (eight) hours. Patient not taking: Reported on 01/21/2017 01/09/17   Lavon Paganini Angiulli, PA-C   No Known Allergies Review of Systems  Constitutional: Positive for activity change, chills and fever.  Respiratory: Positive for shortness of breath.   Musculoskeletal: Positive for back pain and myalgias.  Neurological: Positive for weakness.  Psychiatric/Behavioral: Positive for sleep disturbance.    Physical Exam  General: Alert, awake, in no acute distress. Tires easily.  HEENT: No bruits, no goiter, no JVD Heart: Regular rate. No murmur appreciated. Lungs: Fair air movement, scattered rhonchi in bases Abdomen: Soft, nontender, nondistended, positive bowel sounds.  Ext: No significant edema Skin: Warm and dry Neuro: Grossly intact, nonfocal.  Vital Signs: BP (!) 111/58 (BP Location: Right Arm)   Pulse 87   Temp 97.5 F (36.4 C) (Oral)   Resp  20   Ht 6' 2"  (1.88 m)   Wt 105.6 kg (232 lb 12.9 oz)   SpO2 90%   BMI 29.89 kg/m  Pain Assessment: No/denies pain   Pain Score: Asleep   SpO2: SpO2: 90 % O2 Device:SpO2: 90 % O2 Flow Rate: .O2 Flow Rate (L/min): 3 L/min  IO: Intake/output summary:  Intake/Output Summary (Last 24 hours) at 01/27/17 0655 Last data filed at 01/26/17 2110  Gross per 24 hour  Intake              460 ml  Output              800 ml  Net             -340 ml    LBM: Last BM Date: 01/25/17 Baseline Weight: Weight: 91.6 kg (202 lb) Most recent weight: Weight: 105.6 kg (232 lb 12.9 oz)     Palliative Assessment/Data:     Time In: 1830 Time Out: 1945 Time Total: 75 Greater than 50%  of this time was spent counseling and coordinating care related to the above assessment and plan.  Signed by: Micheline Rough, MD   Please contact Palliative Medicine Team phone at [phone] for questions and concerns.  For individual provider: See Shea Evans

## 2017-01-27 NOTE — Progress Notes (Addendum)
Palliative Medicine Team Progress note  83 y.o. male  with past medical history of COPD, A. Fib, CAD s/p CABG, OSA on oxygen at night, s/p PPM, s/p AVR,morbid obesity. He was recently hospitalized in 11/2016 for traumatic subdural hematoma requiring craniotomy. He had prolonged rehabilitation stay from which he was discharged home with 24-hour nursing care approx 2 weeks ago, now admitted on 01/21/2017 with sepsis, UTI, PNA, and klebsiella bacteremia.  Palliative consulted for goals of care.   Elderly gentleman resting in bed BP 93/71 (BP Location: Right Arm)   Pulse (!) 115   Temp 97.8 F (36.6 C) (Axillary)   Resp 20   Ht  (1.88 m)   Wt 105.6 kg (232 lb 12.9 oz)   SpO2 91%   BMI 29.89 kg/m  Labs noted.  Elderly gentleman resting in bed Upper extremity edema bilateral Not very awake/alert S1 S2 Abdomen soft Clear shallow breath sounds, mouth breathing.   Ate some jello/pudding type consistency food, overall with declining oral intake.   Discussed with wife and family friend at the bedside. They wish to receive more information from the attending physician about the patient's current condition, his antibiotics, his recent fever spike. Additionally, they are well aware of the patient's ongoing decline, likely not going to be a candidate for aggressive rehab efforts.   Offered active listening and supportive care, supporting the family as they continue to address goals of care, based on the patient's disease trajectory.   15 minutes spent.  Rosalin Hawking MD [facility] health palliative medicine team [phone]

## 2017-01-27 NOTE — Clinical Social Work Note (Signed)
CSW met with pt and family at bedside to address consult for SNF placement as backup to CIR. Pt was denied CIR and has appealed the decision. Pt is agreeable to SNF placement if CIR appeal denied. Pt preference is 1. [facility] 2. [facility], but agreed to be faxed out to other [facility] SNFs. CSW assessment to follow. CSW will continue to follow for d/c needs.   Oretha Ellis, [place], [place] Work  [phone]

## 2017-01-28 ENCOUNTER — Inpatient Hospital Stay (HOSPITAL_COMMUNITY): Payer: Medicare Other

## 2017-01-28 ENCOUNTER — Other Ambulatory Visit: Payer: Self-pay | Admitting: Urology

## 2017-01-28 DIAGNOSIS — R509 Fever, unspecified: Secondary | ICD-10-CM

## 2017-01-28 DIAGNOSIS — N412 Abscess of prostate: Secondary | ICD-10-CM | POA: Diagnosis present

## 2017-01-28 LAB — CBC
HCT: 31.8 % — ABNORMAL LOW (ref 39.0–52.0)
Hemoglobin: 10.4 g/dL — ABNORMAL LOW (ref 13.0–17.0)
MCH: 31.4 pg (ref 26.0–34.0)
MCHC: 32.7 g/dL (ref 30.0–36.0)
MCV: 96.1 fL (ref 78.0–100.0)
Platelets: 240 10*3/uL (ref 150–400)
RBC: 3.31 MIL/uL — ABNORMAL LOW (ref 4.22–5.81)
RDW: 15.5 % (ref 11.5–15.5)
WBC: 8.5 10*3/uL (ref 4.0–10.5)

## 2017-01-28 LAB — BASIC METABOLIC PANEL
Anion gap: 8 (ref 5–15)
BUN: 14 mg/dL (ref 6–20)
CALCIUM: 7.7 mg/dL — AB (ref 8.9–10.3)
CO2: 29 mmol/L (ref 22–32)
Chloride: 103 mmol/L (ref 101–111)
Creatinine, Ser: 0.7 mg/dL (ref 0.61–1.24)
GFR calc Af Amer: >60 mL/min (ref >60)
GLUCOSE: 88 mg/dL (ref 65–99)
Potassium: 3.5 mmol/L (ref 3.5–5.1)
Sodium: 140 mmol/L (ref 135–145)

## 2017-01-28 LAB — HIV ANTIBODY (ROUTINE TESTING W REFLEX): HIV Screen 4th Generation wRfx: NONREACTIVE

## 2017-01-28 NOTE — Consult Note (Addendum)
Urology Consult  CC: Referring physician: Daniel Thompson, MD Reason for referral: Prostatic abscess  Impression/Assessment: Prostatic abscess: It would appear that his continued spiking fevers, dysuria, urgency and urge incontinence are most likely secondary to a prostatic abscess.  He has changes on his CT scan in the right lobe of his prostate that would suggest this and his physical exam has confirmed fluctuance in the right lobe of his prostate with exquisite tenderness to palpation of this area. I have therefore discussed with him the need for transurethral unroofing of his prostatic abscess.  I have discussed with him how this procedure would be performed cystoscopically, the risks and complications, the probability of success and the anticipated postoperative course.  I called his wife and discussed the planned surgery with her as well.  They both seemed to understand and have elected to proceed.   Plan: He is scheduled for transurethral unroofing of his prostatic abscess tomorrow morning at 10 AM.    History of Present Illness: Mr. Alex [name] is an 83-year-old male who I was asked to see in hospital consultation today regarding possible prostatic abscess.  He was admitted on 01/21/17 after having difficulty with intermittent confusion and subjective fever at home.  He was found to be febrile with a temperature 101.8 and hypotensive consistent with sepsis either of pulmonary or GU origin.  His urinalysis appeared infected and a urine culture was performed and grew both Klebsiella and E. coli.  He was placed on vancomycin and Zosyn which was eventually noted to Rocephin however he continued to have spiking fever to greater than 101.  A CT scan was obtained and area of increased density in the right lobe of his prostate suspicious for possible prostatic abscess. He reports that he has been experiencing urinary frequency as well as urgency with urge incontinence for approximately 1 month period of time.   He also has been having intermittent mild dysuria.  He has no prior history of UTIs or prostatitis.  He also reports he has never experienced any gross hematuria.  He denies any testicular pain.  He has been having what sounds like urgency with urge incontinence for the past month as well.  Past Medical History:  Diagnosis Date  . Aortic stenosis    s/p AVR by Dr Bartle  . COPD (chronic obstructive pulmonary disease) (HCC)   . History of coronary artery disease    status post stenting of the marginal circumflex in 12/2003 and again in 2009  . Hyperlipidemia   . Morbid obesity (HCC)    weight 243 pounds, BMI 31.2kg/m2, BSA 2.36 square meters  . Obstructive sleep apnea    compliant with CPAP  . Persistent atrial fibrillation (HCC)    Past Surgical History:  Procedure Laterality Date  . AORTIC VALVE REPLACEMENT (AVR)/CORONARY ARTERY BYPASS GRAFTING (CABG)   08/05/2011   LIMA to LAD, sequential saphenous vein graft to third and fourth obtuse marginal branches of the circumflex, aortic valve replacement using a 23 mm Edwards pericardial valve  . APPENDECTOMY    . CARDIOVERSION N/A 11/14/2014   Procedure: CARDIOVERSION;  Surgeon: Keishon Chavarin Skains, MD;  Location: [facility];  Service: Cardiovascular;  Laterality: N/A;  . CARDIOVERSION N/A 11/20/2016   Procedure: CARDIOVERSION;  Surgeon: Paula Ross V, MD;  Location: [facility];  Service: Cardiovascular;  Laterality: N/A;  . CAROTID ENDARTERECTOMY     Dr Dale Williams  . CRANIOTOMY Right 12/16/2016   Procedure: CRANIOTOMY HEMATOMA EVACUATION SUBDURAL;  Surgeon: Benjamin Jared Ditty, MD;  Location:   [facility] OR;  Service: Neurosurgery;  Laterality: Right;  . EP IMPLANTABLE DEVICE N/A 11/06/2015   Procedure: PPM Generator Changeout;for sick sinus syndrome with a MDT Adapta L PPM, chronically elevated RV threshold.  . permanent pacemaker     MDT EnRhythm implanted by Dr Al-Kori in [place] for complete heart block with syncope  . REPLACEMENT TOTAL KNEE  BILATERAL     2006  . TEE WITHOUT CARDIOVERSION N/A 11/14/2014   Procedure: TRANSESOPHAGEAL ECHOCARDIOGRAM (TEE);  Surgeon: Idamay Hosein Skains, MD;  Location: [facility];  Service: Cardiovascular;  Laterality: N/A;    Medications:  Scheduled: . ALPRAZolam  0.25 mg Oral QHS  . amiodarone  200 mg Oral BID  . aspirin  81 mg Oral Daily  . atorvastatin  40 mg Oral Daily  . cefTRIAXone (ROCEPHIN)  IV  2 g Intravenous Q24H  . escitalopram  10 mg Oral QHS  . furosemide  20 mg Oral Daily  . levETIRAcetam  500 mg Oral BID  . magnesium oxide  200 mg Oral Daily  . mouth rinse  15 mL Mouth Rinse BID  . metoprolol succinate  12.5 mg Oral Daily   Continuous: . sodium chloride 75 mL/hr at 01/28/17 0430    Allergies: No Known Allergies  Family History  Problem Relation Age of Onset  . Alzheimer's disease Mother   . Tuberculosis Father   . Other Father     Spinal Meningitis    Social History:  reports that he quit smoking about 13 years ago. His smoking use included Cigarettes. He started smoking about 43 years ago. He has a 15.00 pack-year smoking history. He has never used smokeless tobacco. He reports that he drinks alcohol. He reports that he does not use drugs.  Review of Systems (10 point): Pertinent items are noted in HPI. A comprehensive review of systems was negative except as noted above.  Physical Exam:  Vital signs in last 24 hours: Temp:  [98.2 F (36.8 C)-99.9 F (37.7 C)] 98.4 F (36.9 C) (04/04 1311) Pulse Rate:  [33-90] 33 (04/04 1311) Resp:  [16-18] 17 (04/04 1311) BP: (95-138)/(53-69) 97/60 (04/04 1311) SpO2:  [90 %-100 %] 92 % (04/04 1311) Weight:  [230 lb 11.2 oz (104.6 kg)] 230 lb 11.2 oz (104.6 kg) (04/04 0431) General appearance: alert and appears stated age Head: Normocephalic, without obvious abnormality, atraumatic Eyes: conjunctivae/corneas clear. EOM's intact.  Oropharynx: moist mucous membranes Neck: supple, symmetrical, trachea midline Resp: normal  respiratory effort Cardio: regular rate and rhythm Back: symmetric, no curvature. ROM normal. No CVA tenderness. GI: soft, non-tender; bowel sounds normal; no masses,  no organomegaly  Male genitalia: penis: normal male phallus with no lesions or discharge.Testes: bilaterally descended with no masses or tenderness. Rectal: Normal sphincter tone, his prostate is 1-2+ enlarged and smooth.  There is fluctuance noted in the right lobe which is tender to palpation.  Extremities: extremities normal, atraumatic, no cyanosis. Skin: Skin color normal. No visible rashes or lesions Neurologic: Grossly normal with overall decreased mobility  Laboratory Data:   Recent Labs  01/26/17 0620 01/28/17 0306  WBC 8.5 8.5  HGB 10.1* 10.4*  HCT 30.4* 31.8*   BMET  Recent Labs  01/26/17 0620 01/28/17 0306  NA 136 140  K 4.0 3.5  CL 103 103  CO2 24 29  GLUCOSE 89 88  BUN 14 14  CREATININE 0.70 0.70  CALCIUM 7.6* 7.7*   No results for input(s): LABPT, INR in the last 72 hours. No results for input(s): LABURIN   in the last 72 hours. Results for orders placed or performed during the hospital encounter of 01/21/17  Culture, blood (Routine x 2)     Status: None   Collection Time: 01/21/17  8:10 PM  Result Value Ref Range Status   Specimen Description BLOOD LEFT HAND  Final   Special Requests IN PEDIATRIC BOTTLE 4CC  Final   Culture NO GROWTH 5 DAYS  Final   Report Status 01/26/2017 FINAL  Final  Culture, blood (Routine x 2)     Status: Abnormal   Collection Time: 01/21/17  8:20 PM  Result Value Ref Range Status   Specimen Description BLOOD RIGHT ANTECUBITAL  Final   Special Requests BOTTLES DRAWN AEROBIC AND ANAEROBIC 5CC  Final   Culture  Setup Time   Final    GRAM NEGATIVE RODS AEROBIC BOTTLE ONLY CRITICAL RESULT CALLED TO, READ BACK BY AND VERIFIED WITH: E. MARTIN PHARMD, AT 1415 01/22/17 BY D. VANHOOK    Culture KLEBSIELLA PNEUMONIAE (A)  Final   Report Status 01/24/2017 FINAL  Final    Organism ID, Bacteria KLEBSIELLA PNEUMONIAE  Final      Susceptibility   Klebsiella pneumoniae - MIC*    AMPICILLIN >=32 RESISTANT Resistant     CEFAZOLIN <=4 SENSITIVE Sensitive     CEFEPIME <=1 SENSITIVE Sensitive     CEFTAZIDIME <=1 SENSITIVE Sensitive     CEFTRIAXONE <=1 SENSITIVE Sensitive     CIPROFLOXACIN 1 SENSITIVE Sensitive     GENTAMICIN <=1 SENSITIVE Sensitive     IMIPENEM <=0.25 SENSITIVE Sensitive     TRIMETH/SULFA 40 SENSITIVE Sensitive     AMPICILLIN/SULBACTAM 16 INTERMEDIATE Intermediate     PIP/TAZO 8 SENSITIVE Sensitive     Extended ESBL NEGATIVE Sensitive     * KLEBSIELLA PNEUMONIAE  Blood Culture ID Panel (Reflexed)     Status: Abnormal   Collection Time: 01/21/17  8:20 PM  Result Value Ref Range Status   Enterococcus species NOT DETECTED NOT DETECTED Final   Listeria monocytogenes NOT DETECTED NOT DETECTED Final   Staphylococcus species NOT DETECTED NOT DETECTED Final   Staphylococcus aureus NOT DETECTED NOT DETECTED Final   Streptococcus species NOT DETECTED NOT DETECTED Final   Streptococcus agalactiae NOT DETECTED NOT DETECTED Final   Streptococcus pneumoniae NOT DETECTED NOT DETECTED Final   Streptococcus pyogenes NOT DETECTED NOT DETECTED Final   Acinetobacter baumannii NOT DETECTED NOT DETECTED Final   Enterobacteriaceae species DETECTED (A) NOT DETECTED Final    Comment: Enterobacteriaceae represent a large family of gram-negative bacteria, not a single organism. CRITICAL RESULT CALLED TO, READ BACK BY AND VERIFIED WITH: E. MARTIN PHARMD, AT 1415 01/22/17 BY D. VANHOOK    Enterobacter cloacae complex NOT DETECTED NOT DETECTED Final   Escherichia coli NOT DETECTED NOT DETECTED Final   Klebsiella oxytoca NOT DETECTED NOT DETECTED Final   Klebsiella pneumoniae DETECTED (A) NOT DETECTED Final    Comment: CRITICAL RESULT CALLED TO, READ BACK BY AND VERIFIED WITH: E. MARTIN PHARMD, AT 1415 01/22/17 BY D. VANHOOK    Proteus species NOT DETECTED NOT  DETECTED Final   Serratia marcescens NOT DETECTED NOT DETECTED Final   Carbapenem resistance NOT DETECTED NOT DETECTED Final   Haemophilus influenzae NOT DETECTED NOT DETECTED Final   Neisseria meningitidis NOT DETECTED NOT DETECTED Final   Pseudomonas aeruginosa NOT DETECTED NOT DETECTED Final   Candida albicans NOT DETECTED NOT DETECTED Final   Candida glabrata NOT DETECTED NOT DETECTED Final   Candida krusei NOT DETECTED NOT DETECTED Final     Candida parapsilosis NOT DETECTED NOT DETECTED Final   Candida tropicalis NOT DETECTED NOT DETECTED Final  Urine culture     Status: Abnormal   Collection Time: 01/21/17  8:31 PM  Result Value Ref Range Status   Specimen Description URINE, CATHETERIZED  Final   Special Requests NONE  Final   Culture (A)  Final    >=100,000 COLONIES/mL ESCHERICHIA COLI >=100,000 COLONIES/mL KLEBSIELLA PNEUMONIAE    Report Status 01/24/2017 FINAL  Final   Organism ID, Bacteria ESCHERICHIA COLI (A)  Final   Organism ID, Bacteria KLEBSIELLA PNEUMONIAE (A)  Final      Susceptibility   Escherichia coli - MIC*    AMPICILLIN <=2 SENSITIVE Sensitive     CEFAZOLIN <=4 SENSITIVE Sensitive     CEFTRIAXONE <=1 SENSITIVE Sensitive     CIPROFLOXACIN >=4 RESISTANT Resistant     GENTAMICIN <=1 SENSITIVE Sensitive     IMIPENEM <=0.25 SENSITIVE Sensitive     NITROFURANTOIN <=16 SENSITIVE Sensitive     TRIMETH/SULFA <=20 SENSITIVE Sensitive     AMPICILLIN/SULBACTAM <=2 SENSITIVE Sensitive     PIP/TAZO <=4 SENSITIVE Sensitive     Extended ESBL NEGATIVE Sensitive     * >=100,000 COLONIES/mL ESCHERICHIA COLI   Klebsiella pneumoniae - MIC*    AMPICILLIN >=32 RESISTANT Resistant     CEFAZOLIN <=4 SENSITIVE Sensitive     CEFTRIAXONE <=1 SENSITIVE Sensitive     CIPROFLOXACIN <=0.25 SENSITIVE Sensitive     GENTAMICIN <=1 SENSITIVE Sensitive     IMIPENEM <=0.25 SENSITIVE Sensitive     NITROFURANTOIN 64 INTERMEDIATE Intermediate     TRIMETH/SULFA <=20 SENSITIVE Sensitive      AMPICILLIN/SULBACTAM 4 SENSITIVE Sensitive     PIP/TAZO <=4 SENSITIVE Sensitive     Extended ESBL NEGATIVE Sensitive     * >=100,000 COLONIES/mL KLEBSIELLA PNEUMONIAE  Respiratory Panel by PCR     Status: None   Collection Time: 01/22/17  2:19 AM  Result Value Ref Range Status   Adenovirus NOT DETECTED NOT DETECTED Final   Coronavirus 229E NOT DETECTED NOT DETECTED Final   Coronavirus HKU1 NOT DETECTED NOT DETECTED Final   Coronavirus NL63 NOT DETECTED NOT DETECTED Final   Coronavirus OC43 NOT DETECTED NOT DETECTED Final   Metapneumovirus NOT DETECTED NOT DETECTED Final   Rhinovirus / Enterovirus NOT DETECTED NOT DETECTED Final   Influenza A NOT DETECTED NOT DETECTED Final   Influenza B NOT DETECTED NOT DETECTED Final   Parainfluenza Virus 1 NOT DETECTED NOT DETECTED Final   Parainfluenza Virus 2 NOT DETECTED NOT DETECTED Final   Parainfluenza Virus 3 NOT DETECTED NOT DETECTED Final   Parainfluenza Virus 4 NOT DETECTED NOT DETECTED Final   Respiratory Syncytial Virus NOT DETECTED NOT DETECTED Final   Bordetella pertussis NOT DETECTED NOT DETECTED Final   Chlamydophila pneumoniae NOT DETECTED NOT DETECTED Final   Mycoplasma pneumoniae NOT DETECTED NOT DETECTED Final  Culture, blood (routine x 2)     Status: None (Preliminary result)   Collection Time: 01/24/17 12:24 PM  Result Value Ref Range Status   Specimen Description BLOOD RIGHT ANTECUBITAL  Final   Special Requests IN PEDIATRIC BOTTLE 3CC  Final   Culture NO GROWTH 4 DAYS  Final   Report Status PENDING  Incomplete  Culture, blood (routine x 2)     Status: None (Preliminary result)   Collection Time: 01/24/17 12:24 PM  Result Value Ref Range Status   Specimen Description BLOOD RIGHT ANTECUBITAL  Final   Special Requests IN PEDIATRIC BOTTLE 2.5CC    Final   Culture NO GROWTH 4 DAYS  Final   Report Status PENDING  Incomplete   Creatinine:  Recent Labs  01/22/17 0243 01/23/17 0303 01/24/17 0523 01/25/17 0309  01/26/17 0620 01/28/17 0306  CREATININE 0.94 0.86 0.74 0.64 0.70 0.70    Imaging: Ct Abdomen Pelvis W Contrast  Result Date: 01/27/2017 CLINICAL DATA:  Subjective fevers, decreased appetite generalized weakness x3 days. EXAM: CT ABDOMEN AND PELVIS WITH CONTRAST TECHNIQUE: Multidetector CT imaging of the abdomen and pelvis was performed using the standard protocol following bolus administration of intravenous contrast. CONTRAST:  100mL ISOVUE-300 IOPAMIDOL (ISOVUE-300) INJECTION 61% COMPARISON:  01/21/2017 chest CT, 07/14/2006 CT abdomen and pelvis report FINDINGS: Lower chest: Small bilateral pleural effusions slightly increased from recent comparison chest CT. Subpleural areas of fibrosis with peripheral ground-glass opacities and emphysematous change of the lung bases are again noted. Hepatobiliary: No focal liver abnormality is seen. No gallstones, gallbladder wall thickening, or biliary dilatation. Pancreas: Mild pancreatic atrophy without ductal dilatation or mass. No peripancreatic inflammation. Spleen: Normal in size without focal abnormality. Adrenals/Urinary Tract: Normal bilateral adrenal glands. No renal mass nor nephrolithiasis. No obstructive uropathy. Nonspecific mild perinephric fat stranding of both kidneys. The urinary bladder is nondistended minimal wall thickening anteriorly possibly due to underdistention. Stomach/Bowel: Contracted stomach with normal small bowel rotation. No small bowel dilatation or inflammation. Moderate colonic stool burden is seen. Appendectomy per report. There is descending sigmoid diverticulosis without acute diverticulitis. Vascular/Lymphatic: Aortic and branch vessel atherosclerosis. No aneurysm or dissection. Atherosclerotic calcifications at the origins of the celiac axis, SMA and both renal arteries. No enlarged abdominal or pelvic lymph nodes. Reproductive: Multifocal areas of hypodensity within an enlarged prostate. The prostate measures up to 6.4 cm  transverse by 4.3 cm AP. Findings are concerning for changes of prostatitis and potentially involving prostatic abscess sees the largest is within the right lobe measuring 2.6 cm in diameter with smaller foci in left lobe. Other: Mild diffuse soft tissue edema. Musculoskeletal: Degenerative disc disease L2-3, L4-5 and L5-S1. No findings of osteoblastic or osteolytic disease. No acute osseous abnormality. IMPRESSION: 1. Enlarged appearing prostate with multifocal areas of hypodensity within, the largest measuring 2.6 cm on the right. Changes of prostatitis with prostate abscess is of concern. 2. Interval slight increase bilateral pleural effusions with chronic subpleural areas of fibrosis and ground-glass opacity. 3. Aortic and branch vessel atherosclerosis. 4. Degenerative disc disease of the lumbar spine. Electronically Signed   By: David  Kwon M.D.   On: 01/27/2017 22:59    CT scan images were independently reviewed as noted above.   Antione Obar C 01/28/2017, 7:58 PM      

## 2017-01-28 NOTE — Progress Notes (Signed)
PROGRESS NOTE    Alex [name]  JKQ:[id number] DOB: [birth year] DOA: 01/21/2017 PCP: Derrill Center, MD    Brief Narrative:  83 y.o.malewith medical history significant for COPD, A. Fib, CAD s/p CABG, OSA on oxygen at night, s/p PPM, s/p AVR,morbid obesity. He was recently hospitalized in 11/2016 for traumatic subdural hematoma requiring craniotomy. He had prolonged rehabilitation stay for which he was just recently discharged home with 24-hour nursing care 1-1/2weeks ago PTA. Patient has chronic atrial fibrillation and had anunsuccessful trial of cardioversion on 1/25 with Dr. Harrington Challenger. Following the subdural hematoma patient was taken off of Coumadin and has only been on a baby aspirin daily.   Pt presented to [facility] with generalized weakness for past 3 days prior to this admission. He also was more confused especially in the evenings. Pt reported having subjective fevers, decreased appetite.  In ED, pt had fever of 101.30F, pulse rate 38-81, respirations 17-28, blood pressure as low as 80/42, and O2 satsmaintained above 90% on 2 L. Labs revealed normal WBC, hemoglobin 10.7, albumin 1.7, AST 95, ALT 97, lactic acid 1.98, and all other labs relatively unremarkable. Urinalysis showed trace leukocytes. CXR showed bibasilar infiltrates, possible aspiration. No acute findings on CT head. He was started on empiric abx for UTI and pneumonia.  Barrier to discharge: Dr Charlies Silvers spoke with pt daughter over the phone; I have talked to her about the plan of care (obtaining ID consultation, LUE doppler), we also talked about how plan is for continuing to monitor fever curve, not sure why is he still spiking fever. I assured pt daughter that there was no fever yesterday but she said he has fever every single day rectally measured. I also let pt know of my cell# to call me for questions or concerns. She then mentioned to me that I am not working tomorrow "I looked you up on amion". I still offered her to call me as I  am not sure what my schedule will be tomorrow.  ID was consulted and left upper extremity Dopplers obtained due to swelling left upper extremity however pending at this time. CT abdomen and pelvis which was obtained was worrisome for prostatitis and prostatic abscess. Urology was consulted assessed the patient and recommended a proven of prostatic abscess to be done at [facility] on 01/29/2017.  Assessment & Plan:   Assessment & Plan:   Principal Problem:   Sepsis ([place]) Active Problems:   Prostate abscess   3-vessel coronary artery disease   Hyperlipidemia, mixed   Persistent atrial fibrillation (HCC)   Atrial fibrillation (HCC)   Transient hypotension   HCAP (healthcare-associated pneumonia)   Bacteremia due to Klebsiella pneumoniae   Left arm swelling   FUO (fever of unknown origin)    Sepsis secondary to bibasilar pneumonia, possible aspiration pneumonitis / UTI secondary to E.COli and Klebsiella pneumoniae ([place]) / Klebsiella pneumoniae bacteremia/prostatitis and prostatic abscess. - Sepsis criteria met on admission with fever, tachypnea, tachycardia, hypotension. Source of infection is pneumonia and UTI and probable prostatitis/prostatic abscess noted on CT abdomen and pelvis 01/27/2017. - Urine cx grew E.Coli and Klebsiella pneumoniae - Blood cx grew Klebsiella pneumoniae - Repeat blood cx 3/31 so far showed no growth  - Resp virus panel negative  - Vanco and zosyn stopped 3/29 and we narrowed to rocephin  - Pt spiked fever 01/27/2017 and a such ID was consulted whose recommendations at that time was CT abdomen and pelvis. Family was hesitant to get CT abdomen and pelvis per  ID no however this was obtained on 01/27/2017 which was positive for prostatitis and prostatic abscess. Patient has dizziness consultation by urology were recommending unroofing of prostatic abscess in the operating room on 01/29/2017 at [facility] Continue current IV antibiotics. ID and  urology following and appreciate input and recommendations.   Prostatitis and prostatic abscess Per CT abdomen and pelvis 01/27/2017. Patient also noted to be spiking fevers. Urology has been consulted and I recommending approving of prostatic abscess to be done in the operating room at [facility] 01/29/2017 per Dr.Ottelin. Continue current empiric IV antibiotics. ID following.  Active Problems:  Left upper extremity swelling - Some improvement with left upper extremity swelling. Upper extremity Dopplers pending. Due to recent subdural hematoma requiring craniotomy February 2018 patient not a candidate for anticoagulation at this time.     Persistent atrial fibrillation (HCC) - CHADS vasc score at least 3 - Currently rate controlled on metoprolol and amiodarone. Not on coumadin due to subdural hematoma; he is on aspirin 81 mg daily    Chronic diastolic CHF - Compensated - Continue daily lasix       Essential hypertension - Continue metoprolol     Dyslipidemia - Continue Lipitor     Depression  - Continue Lexapro     Seizure disorder - Patient with no noted seizures. - Continue Keppra  - Continue xanax at bedtime   -Fever Likely secondary to prostatitis and prostatic abscess. Please see above.    DVT prophylaxis: SCDs Code Status: DO NOT RESUSCITATE Family Communication:  Updated family at bedside and via telephone. Disposition Plan: Final disposition pending workup likely skilled nursing facility once medically stable. Patient to be [facility] tomorrow for unroofing of prostatic abscess.   Consultants:   Inpatient rehabilitation Dr.Swartz 01/26/2017  Palliative care: Dr. Domingo Cocking 01/26/2017  Infectious diseases: Dr. Tommy Medal 01/27/2017  Urology: Dr.Ottelin 01/28/2017  Procedures:   CT abdomen and pelvis 01/27/2017  Left upper extremity Dopplers pending.  Chest x-ray 01/25/2017, 01/21/2017  Antimicrobials:   IV  Rocephin 01/22/2017  IV Zosyn 01/21/2017  IV vancomycin 01/21/2017>>> 01/22/2017   Subjective: Patient denies any chest. No shortness of breath. Family concerned that patient's temperatures have been taken orally and requesting to be taken rectally. Patient states feels better today.  Objective: Vitals:   01/28/17 0623 01/28/17 0830 01/28/17 1047 01/28/17 1311  BP: (!) 97/57 (!) 95/54 (!) 105/53 97/60  Pulse: 80 69 73 (!) 33  Resp: _0 Temp: 98.7 F (37.1 C) 98.5 F (36.9 C) 98.2 F (36.8 C) 98.4 F (36.9 C)  TempSrc: Rectal Rectal Oral Oral  SpO2: 91% 100% 90% 92%  Weight:      Height:        Intake/Output Summary (Last 24 hours) at 01/28/17 1845 Last data filed at 01/28/17 1059  Gross per 24 hour  Intake                0 ml  Output              950 ml  Net             -950 ml   Filed Weights   01/26/17 0443 01/27/17 0537 01/28/17 0431  Weight: 102.6 kg (226 lb 1.6 oz) 105.6 kg (232 lb 12.9 oz) 104.6 kg (230 lb 11.2 oz)    Examination:  General exam: Appears calm and comfortable.Dry mucous membranes. Respiratory system: Clear to auscultation anterior lung fields. Respiratory effort  normal. Cardiovascular system: S1 & S2 heard, RRR. No JVD, murmurs, rubs, gallops or clicks. No pedal edema. Gastrointestinal system: Abdomen is nondistended, soft and nontender. No organomegaly or masses felt. Normal bowel sounds heard. Central nervous system: Alert and oriented. No focal neurological deficits. Extremities: Symmetric 5 x 5 power. Skin: No rashes, lesions or ulcers Psychiatry: Judgement and insight appear normal. Mood & affect appropriate.     Data Reviewed: I have personally reviewed following labs and imaging studies  CBC:  Recent Labs Lab 01/23/17 0303 01/24/17 0523 01/25/17 0309 01/26/17 0620 01/28/17 0306  WBC 8.2 7.8 8.3 8.5 8.5  HGB 10.6* 10.4* 10.7* 10.1* 10.4*  HCT 32.8* 31.9* 32.8* 30.4* 31.8*  MCV 96.2 95.8 95.6 93.8 96.1  PLT 189 207  236 233 193   Basic Metabolic Panel:  Recent Labs Lab 01/23/17 0303 01/24/17 0523 01/25/17 0309 01/26/17 0620 01/28/17 0306  NA 138 138 134* 136 140  K 3.9 3.9 3.5 4.0 3.5  CL 103 103 101 103 103  CO2 _0 GLUCOSE 102* 88 92 89 88  BUN _1 CREATININE 0.86 0.74 0.64 0.70 0.70  CALCIUM 7.7* 7.8* 7.7* 7.6* 7.7*   GFR: Estimated Creatinine Clearance: 88.7 mL/min (by C-G formula based on SCr of 0.7 mg/dL). Liver Function Tests:  Recent Labs Lab 01/22/17 0243  AST 77*  ALT 87*  ALKPHOS 94  BILITOT 0.8  PROT 4.9*  ALBUMIN 1.6*   No results for input(s): LIPASE, AMYLASE in the last 168 hours. No results for input(s): AMMONIA in the last 168 hours. Coagulation Profile: No results for input(s): INR, PROTIME in the last 168 hours. Cardiac Enzymes:  Recent Labs Lab 01/22/17 0243 01/22/17 0639  TROPONINI <0.03 <0.03   BNP (last 3 results) No results for input(s): PROBNP in the last 8760 hours. HbA1C: No results for input(s): HGBA1C in the last 72 hours. CBG: No results for input(s): GLUCAP in the last 168 hours. Lipid Profile: No results for input(s): CHOL, HDL, LDLCALC, TRIG, CHOLHDL, LDLDIRECT in the last 72 hours. Thyroid Function Tests: No results for input(s): TSH, T4TOTAL, FREET4, T3FREE, THYROIDAB in the last 72 hours. Anemia Panel: No results for input(s): VITAMINB12, FOLATE, FERRITIN, TIBC, IRON, RETICCTPCT in the last 72 hours. Sepsis Labs:  Recent Labs Lab 01/21/17 2030 01/21/17 2228 01/22/17 0243 01/22/17 0639  PROCALCITON  --   --  0.18  --   LATICACIDVEN 1.87 1.67 1.7 1.4    Recent Results (from the past 240 hour(s))  Culture, blood (Routine x 2)     Status: None   Collection Time: 01/21/17  8:10 PM  Result Value Ref Range Status   Specimen Description BLOOD LEFT HAND  Final   Special Requests IN PEDIATRIC BOTTLE 4CC  Final   Culture NO GROWTH 5 DAYS  Final   Report Status 01/26/2017 FINAL  Final  Culture, blood  (Routine x 2)     Status: Abnormal   Collection Time: 01/21/17  8:20 PM  Result Value Ref Range Status   Specimen Description BLOOD RIGHT ANTECUBITAL  Final   Special Requests BOTTLES DRAWN AEROBIC AND ANAEROBIC 5CC  Final   Culture  Setup Time   Final    GRAM NEGATIVE RODS AEROBIC BOTTLE ONLY CRITICAL RESULT CALLED TO, READ BACK BY AND VERIFIED WITH: EHassell Done PHARMD, AT 7902 01/22/17 BY D. VANHOOK    Culture KLEBSIELLA PNEUMONIAE (A)  Final   Report Status 01/24/2017 FINAL  Final  Organism ID, Bacteria KLEBSIELLA PNEUMONIAE  Final      Susceptibility   Klebsiella pneumoniae - MIC*    AMPICILLIN >=32 RESISTANT Resistant     CEFAZOLIN <=4 SENSITIVE Sensitive     CEFEPIME <=1 SENSITIVE Sensitive     CEFTAZIDIME <=1 SENSITIVE Sensitive     CEFTRIAXONE <=1 SENSITIVE Sensitive     CIPROFLOXACIN 1 SENSITIVE Sensitive     GENTAMICIN <=1 SENSITIVE Sensitive     IMIPENEM <=0.25 SENSITIVE Sensitive     TRIMETH/SULFA 40 SENSITIVE Sensitive     AMPICILLIN/SULBACTAM 16 INTERMEDIATE Intermediate     PIP/TAZO 8 SENSITIVE Sensitive     Extended ESBL NEGATIVE Sensitive     * KLEBSIELLA PNEUMONIAE  Blood Culture ID Panel (Reflexed)     Status: Abnormal   Collection Time: 01/21/17  8:20 PM  Result Value Ref Range Status   Enterococcus species NOT DETECTED NOT DETECTED Final   Listeria monocytogenes NOT DETECTED NOT DETECTED Final   Staphylococcus species NOT DETECTED NOT DETECTED Final   Staphylococcus aureus NOT DETECTED NOT DETECTED Final   Streptococcus species NOT DETECTED NOT DETECTED Final   Streptococcus agalactiae NOT DETECTED NOT DETECTED Final   Streptococcus pneumoniae NOT DETECTED NOT DETECTED Final   Streptococcus pyogenes NOT DETECTED NOT DETECTED Final   Acinetobacter baumannii NOT DETECTED NOT DETECTED Final   Enterobacteriaceae species DETECTED (A) NOT DETECTED Final    Comment: Enterobacteriaceae represent a large family of gram-negative bacteria, not a single  organism. CRITICAL RESULT CALLED TO, READ BACK BY AND VERIFIED WITH: E. MARTIN PHARMD, AT 1610 01/22/17 BY D. VANHOOK    Enterobacter cloacae complex NOT DETECTED NOT DETECTED Final   Escherichia coli NOT DETECTED NOT DETECTED Final   Klebsiella oxytoca NOT DETECTED NOT DETECTED Final   Klebsiella pneumoniae DETECTED (A) NOT DETECTED Final    Comment: CRITICAL RESULT CALLED TO, READ BACK BY AND VERIFIED WITH: E. MARTIN PHARMD, AT 1415 01/22/17 BY D. VANHOOK    Proteus species NOT DETECTED NOT DETECTED Final   Serratia marcescens NOT DETECTED NOT DETECTED Final   Carbapenem resistance NOT DETECTED NOT DETECTED Final   Haemophilus influenzae NOT DETECTED NOT DETECTED Final   Neisseria meningitidis NOT DETECTED NOT DETECTED Final   Pseudomonas aeruginosa NOT DETECTED NOT DETECTED Final   Candida albicans NOT DETECTED NOT DETECTED Final   Candida glabrata NOT DETECTED NOT DETECTED Final   Candida krusei NOT DETECTED NOT DETECTED Final   Candida parapsilosis NOT DETECTED NOT DETECTED Final   Candida tropicalis NOT DETECTED NOT DETECTED Final  Urine culture     Status: Abnormal   Collection Time: 01/21/17  8:31 PM  Result Value Ref Range Status   Specimen Description URINE, CATHETERIZED  Final   Special Requests NONE  Final   Culture (A)  Final    >=100,000 COLONIES/mL ESCHERICHIA COLI >=100,000 COLONIES/mL KLEBSIELLA PNEUMONIAE    Report Status 01/24/2017 FINAL  Final   Organism ID, Bacteria ESCHERICHIA COLI (A)  Final   Organism ID, Bacteria KLEBSIELLA PNEUMONIAE (A)  Final      Susceptibility   Escherichia coli - MIC*    AMPICILLIN <=2 SENSITIVE Sensitive     CEFAZOLIN <=4 SENSITIVE Sensitive     CEFTRIAXONE <=1 SENSITIVE Sensitive     CIPROFLOXACIN >=4 RESISTANT Resistant     GENTAMICIN <=1 SENSITIVE Sensitive     IMIPENEM <=0.25 SENSITIVE Sensitive     NITROFURANTOIN <=16 SENSITIVE Sensitive     TRIMETH/SULFA <=20 SENSITIVE Sensitive     AMPICILLIN/SULBACTAM <=2 SENSITIVE  Sensitive     PIP/TAZO <=4 SENSITIVE Sensitive     Extended ESBL NEGATIVE Sensitive     * >=100,000 COLONIES/mL ESCHERICHIA COLI   Klebsiella pneumoniae - MIC*    AMPICILLIN >=32 RESISTANT Resistant     CEFAZOLIN <=4 SENSITIVE Sensitive     CEFTRIAXONE <=1 SENSITIVE Sensitive     CIPROFLOXACIN <=0.25 SENSITIVE Sensitive     GENTAMICIN <=1 SENSITIVE Sensitive     IMIPENEM <=0.25 SENSITIVE Sensitive     NITROFURANTOIN 64 INTERMEDIATE Intermediate     TRIMETH/SULFA <=20 SENSITIVE Sensitive     AMPICILLIN/SULBACTAM 4 SENSITIVE Sensitive     PIP/TAZO <=4 SENSITIVE Sensitive     Extended ESBL NEGATIVE Sensitive     * >=100,000 COLONIES/mL KLEBSIELLA PNEUMONIAE  Respiratory Panel by PCR     Status: None   Collection Time: 01/22/17  2:19 AM  Result Value Ref Range Status   Adenovirus NOT DETECTED NOT DETECTED Final   Coronavirus 229E NOT DETECTED NOT DETECTED Final   Coronavirus HKU1 NOT DETECTED NOT DETECTED Final   Coronavirus NL63 NOT DETECTED NOT DETECTED Final   Coronavirus OC43 NOT DETECTED NOT DETECTED Final   Metapneumovirus NOT DETECTED NOT DETECTED Final   Rhinovirus / Enterovirus NOT DETECTED NOT DETECTED Final   Influenza A NOT DETECTED NOT DETECTED Final   Influenza B NOT DETECTED NOT DETECTED Final   Parainfluenza Virus 1 NOT DETECTED NOT DETECTED Final   Parainfluenza Virus 2 NOT DETECTED NOT DETECTED Final   Parainfluenza Virus 3 NOT DETECTED NOT DETECTED Final   Parainfluenza Virus 4 NOT DETECTED NOT DETECTED Final   Respiratory Syncytial Virus NOT DETECTED NOT DETECTED Final   Bordetella pertussis NOT DETECTED NOT DETECTED Final   Chlamydophila pneumoniae NOT DETECTED NOT DETECTED Final   Mycoplasma pneumoniae NOT DETECTED NOT DETECTED Final  Culture, blood (routine x 2)     Status: None (Preliminary result)   Collection Time: 01/24/17 12:24 PM  Result Value Ref Range Status   Specimen Description BLOOD RIGHT ANTECUBITAL  Final   Special Requests IN PEDIATRIC BOTTLE  3CC  Final   Culture NO GROWTH 4 DAYS  Final   Report Status PENDING  Incomplete  Culture, blood (routine x 2)     Status: None (Preliminary result)   Collection Time: 01/24/17 12:24 PM  Result Value Ref Range Status   Specimen Description BLOOD RIGHT ANTECUBITAL  Final   Special Requests IN PEDIATRIC BOTTLE 2.5CC  Final   Culture NO GROWTH 4 DAYS  Final   Report Status PENDING  Incomplete         Radiology Studies: Ct Abdomen Pelvis W Contrast  Result Date: 01/27/2017 CLINICAL DATA:  Subjective fevers, decreased appetite generalized weakness x3 days. EXAM: CT ABDOMEN AND PELVIS WITH CONTRAST TECHNIQUE: Multidetector CT imaging of the abdomen and pelvis was performed using the standard protocol following bolus administration of intravenous contrast. CONTRAST:  114m ISOVUE-300 IOPAMIDOL (ISOVUE-300) INJECTION 61% COMPARISON:  01/21/2017 chest CT, 07/14/2006 CT abdomen and pelvis report FINDINGS: Lower chest: Small bilateral pleural effusions slightly increased from recent comparison chest CT. Subpleural areas of fibrosis with peripheral ground-glass opacities and emphysematous change of the lung bases are again noted. Hepatobiliary: No focal liver abnormality is seen. No gallstones, gallbladder wall thickening, or biliary dilatation. Pancreas: Mild pancreatic atrophy without ductal dilatation or mass. No peripancreatic inflammation. Spleen: Normal in size without focal abnormality. Adrenals/Urinary Tract: Normal bilateral adrenal glands. No renal mass nor nephrolithiasis. No obstructive uropathy. Nonspecific mild perinephric fat stranding of both kidneys.  The urinary bladder is nondistended minimal wall thickening anteriorly possibly due to underdistention. Stomach/Bowel: Contracted stomach with normal small bowel rotation. No small bowel dilatation or inflammation. Moderate colonic stool burden is seen. Appendectomy per report. There is descending sigmoid diverticulosis without acute  diverticulitis. Vascular/Lymphatic: Aortic and branch vessel atherosclerosis. No aneurysm or dissection. Atherosclerotic calcifications at the origins of the celiac axis, SMA and both renal arteries. No enlarged abdominal or pelvic lymph nodes. Reproductive: Multifocal areas of hypodensity within an enlarged prostate. The prostate measures up to 6.4 cm transverse by 4.3 cm AP. Findings are concerning for changes of prostatitis and potentially involving prostatic abscess sees the largest is within the right lobe measuring 2.6 cm in diameter with smaller foci in left lobe. Other: Mild diffuse soft tissue edema. Musculoskeletal: Degenerative disc disease L2-3, L4-5 and L5-S1. No findings of osteoblastic or osteolytic disease. No acute osseous abnormality. IMPRESSION: 1. Enlarged appearing prostate with multifocal areas of hypodensity within, the largest measuring 2.6 cm on the right. Changes of prostatitis with prostate abscess is of concern. 2. Interval slight increase bilateral pleural effusions with chronic subpleural areas of fibrosis and ground-glass opacity. 3. Aortic and branch vessel atherosclerosis. 4. Degenerative disc disease of the lumbar spine. Electronically Signed   By: Ashley Royalty M.D.   On: 01/27/2017 22:59        Scheduled Meds: . ALPRAZolam  0.25 mg Oral QHS  . amiodarone  200 mg Oral BID  . aspirin  81 mg Oral Daily  . atorvastatin  40 mg Oral Daily  . cefTRIAXone (ROCEPHIN)  IV  2 g Intravenous Q24H  . escitalopram  10 mg Oral QHS  . furosemide  20 mg Oral Daily  . levETIRAcetam  500 mg Oral BID  . magnesium oxide  200 mg Oral Daily  . mouth rinse  15 mL Mouth Rinse BID  . metoprolol succinate  12.5 mg Oral Daily   Continuous Infusions: . sodium chloride 75 mL/hr at 01/28/17 0430     LOS: 6 days    Time spent: 65 minutes    THOMPSON,DANIEL, MD [facility] Pager [phone]  If 7PM-7AM, please contact night-coverage [email or web address] Password [facility] 01/28/2017,  6:45 PM

## 2017-01-28 NOTE — Progress Notes (Signed)
Palliative Medicine Team Progress note  83 y.o. male  with past medical history of COPD, A. Fib, CAD s/p CABG, OSA on oxygen at night, s/p PPM, s/p AVR,morbid obesity. He was recently hospitalized in 11/2016 for traumatic subdural hematoma requiring craniotomy. He had prolonged rehabilitation stay from which he was discharged home with 24-hour nursing care approx 2 weeks ago, now admitted on 01/21/2017 with sepsis, UTI, PNA, and klebsiella bacteremia.  Palliative consulted for goals of care.   Elderly gentleman resting in bed Awake alert this afternoon, responds appropriately  BP 97/60 (BP Location: Right Arm)   Pulse (!) 33   Temp 98.4 F (36.9 C) (Oral)   Resp 17   Ht  (1.88 m)   Wt 104.6 kg (230 lb 11.2 oz)   SpO2 92%   BMI 29.62 kg/m  Labs noted.  Elderly gentleman resting in bed Upper extremity edema bilateral, L worse than R Not very awake/alert S1 S2 Abdomen soft Clear   breath sounds    Did not eat much lunch, few bites.    Discussed with wife at the bedside. Wife is thankful for the information received from ID and [facility] MD. Awaiting ultrasound LUE, CT A/P results noted. Patient remains on antibiotics, now also considering SNF. Palliative is following peripherally, to be an extra layer of support for the family.   Offered active listening and supportive care, supporting the family as they continue to address goals of care, based on the patient's disease trajectory.   15 minutes spent.  Rosalin Hawking MD [facility] health palliative medicine team [phone]

## 2017-01-28 NOTE — Progress Notes (Signed)
Received a call from [facility] at [facility] this afternoon to notify this nurse that pt will be transferred to [facility] at 8:30 am. Pt due to have transurethral unroofing prostate abscess surgical procedure. About 30 minutes later received a call from a family representative Haynes Bast who stated that she was a Engineer, civil (consulting) who worked for [employer] at [employer]. She was very upset yelling in the phone stating that it was not right that the family was not notified prior to urologist scheduling surgery.  She was informed that this nurse would contact the Md for more information and contact the wife who was listed as the listed contact person. Spoke with attending Md who stated that he would notify Urologist to contact the wife. Pt is alert and oriented and verbally understands surgical procedure scheduled for tomorrow. New verbal order received from Md to discontinue rectal temps.

## 2017-01-28 NOTE — Progress Notes (Signed)
Subjective:  Feeling better today though concern re his oral temp being lower than likely rectal temperatures   Antibiotics:  Anti-infectives    Start     Dose/Rate Route Frequency Ordered Stop   01/22/17 1800  cefTRIAXone (ROCEPHIN) 2 g in dextrose 5 % 50 mL IVPB     2 g 100 mL/hr over 30 Minutes Intravenous Every 24 hours 01/22/17 1456     01/22/17 0900  vancomycin (VANCOCIN) IVPB 1000 mg/200 mL premix  Status:  Discontinued     1,000 mg 200 mL/hr over 60 Minutes Intravenous Every 12 hours 01/21/17 2001 01/22/17 1456   01/22/17 0300  piperacillin-tazobactam (ZOSYN) IVPB 3.375 g  Status:  Discontinued     3.375 g 12.5 mL/hr over 240 Minutes Intravenous Every 8 hours 01/21/17 2001 01/22/17 1456   01/21/17 2000  piperacillin-tazobactam (ZOSYN) IVPB 3.375 g     3.375 g 100 mL/hr over 30 Minutes Intravenous  Once 01/21/17 1955 01/21/17 2238   01/21/17 2000  vancomycin (VANCOCIN) IVPB 1000 mg/200 mL premix  Status:  Discontinued     1,000 mg 200 mL/hr over 60 Minutes Intravenous  Once 01/21/17 1955 01/21/17 1957   01/21/17 2000  vancomycin (VANCOCIN) 1,750 mg in sodium chloride 0.9 % 500 mL IVPB     1,750 mg 250 mL/hr over 120 Minutes Intravenous  Once 01/21/17 1957 01/21/17 2238      Medications: Scheduled Meds: . ALPRAZolam  0.25 mg Oral QHS  . amiodarone  200 mg Oral BID  . aspirin  81 mg Oral Daily  . atorvastatin  40 mg Oral Daily  . cefTRIAXone (ROCEPHIN)  IV  2 g Intravenous Q24H  . escitalopram  10 mg Oral QHS  . furosemide  20 mg Oral Daily  . levETIRAcetam  500 mg Oral BID  . magnesium oxide  200 mg Oral Daily  . mouth rinse  15 mL Mouth Rinse BID  . metoprolol succinate  12.5 mg Oral Daily   Continuous Infusions: . sodium chloride 75 mL/hr at 01/28/17 0430   PRN Meds:.acetaminophen    Objective: Weight change: -2 lb 1.7 oz (-0.955 kg)  Intake/Output Summary (Last 24 hours) at 01/28/17 1740 Last data filed at 01/28/17 1059  Gross per 24 hour    Intake                0 ml  Output              950 ml  Net             -950 ml   Blood pressure 97/60, pulse (!) 33, temperature 98.4 F (36.9 C), temperature source Oral, resp. rate 17, height  (1.88 m), weight 230 lb 11.2 oz (104.6 kg), SpO2 92 %. Temp:  [98.2 F (36.8 C)-99.9 F (37.7 C)] 98.4 F (36.9 C) (04/04 1311) Pulse Rate:  [33-90] 33 (04/04 1311) Resp:  [16-20] 17 (04/04 1311) BP: (95-139)/(53-90) 97/60 (04/04 1311) SpO2:  [90 %-100 %] 92 % (04/04 1311) Weight:  [230 lb 11.2 oz (104.6 kg)] 230 lb 11.2 oz (104.6 kg) (04/04 0431)  Physical Exam: General:much more awake and interactive today. HEENT: craniotomy site is clean, anicteric sclera,  EOMI, oropharynx clear Cardiovascular: regular rate, normal r,  no murmur rubs or gallops Pulmonary: fairly clear to auscultation bilaterally anteriorly, no wheezes Gastrointestinal: soft nontender, nondistended, normal bowel sounds, Musculoskeletal: LUE is more edematous than right Skin, soft tissue: no rashes Neuro: nonfocal, strength and  sensation intact  CBC:  CBC Latest Ref Rng & Units 01/28/2017 01/26/2017 01/25/2017  WBC 4.0 - 10.5 K/uL 8.5 8.5 8.3  Hemoglobin 13.0 - 17.0 g/dL 10.4(L) 10.1(L) 10.7(L)  Hematocrit 39.0 - 52.0 % 31.8(L) 30.4(L) 32.8(L)  Platelets 150 - 400 K/uL 240 233 236      BMET  Recent Labs  01/26/17 0620 01/28/17 0306  NA 136 140  K 4.0 3.5  CL 103 103  CO2 24 29  GLUCOSE 89 88  BUN 14 14  CREATININE 0.70 0.70  CALCIUM 7.6* 7.7*     Liver Panel  No results for input(s): PROT, ALBUMIN, AST, ALT, ALKPHOS, BILITOT, BILIDIR, IBILI in the last 72 hours.     Sedimentation Rate No results for input(s): ESRSEDRATE in the last 72 hours. C-Reactive Protein No results for input(s): CRP in the last 72 hours.  Micro Results: Recent Results (from the past 720 hour(s))  Culture, Urine     Status: Abnormal   Collection Time: 01/02/17  1:22 PM  Result Value Ref Range Status   Specimen  Description URINE, RANDOM  Final   Special Requests NONE  Final   Culture (A)  Final    >=100,000 COLONIES/mL ESCHERICHIA COLI 50,000 COLONIES/mL KLEBSIELLA PNEUMONIAE    Report Status 01/05/2017 FINAL  Final   Organism ID, Bacteria ESCHERICHIA COLI (A)  Final   Organism ID, Bacteria KLEBSIELLA PNEUMONIAE (A)  Final      Susceptibility   Escherichia coli - MIC*    AMPICILLIN 4 SENSITIVE Sensitive     CEFAZOLIN <=4 SENSITIVE Sensitive     CEFTRIAXONE <=1 SENSITIVE Sensitive     CIPROFLOXACIN >=4 RESISTANT Resistant     GENTAMICIN <=1 SENSITIVE Sensitive     IMIPENEM <=0.25 SENSITIVE Sensitive     NITROFURANTOIN <=16 SENSITIVE Sensitive     TRIMETH/SULFA <=20 SENSITIVE Sensitive     AMPICILLIN/SULBACTAM <=2 SENSITIVE Sensitive     PIP/TAZO <=4 SENSITIVE Sensitive     Extended ESBL NEGATIVE Sensitive     * >=100,000 COLONIES/mL ESCHERICHIA COLI   Klebsiella pneumoniae - MIC*    AMPICILLIN >=32 RESISTANT Resistant     CEFAZOLIN <=4 SENSITIVE Sensitive     CEFTRIAXONE <=1 SENSITIVE Sensitive     CIPROFLOXACIN 1 SENSITIVE Sensitive     GENTAMICIN <=1 SENSITIVE Sensitive     IMIPENEM <=0.25 SENSITIVE Sensitive     NITROFURANTOIN 256 RESISTANT Resistant     TRIMETH/SULFA 40 SENSITIVE Sensitive     AMPICILLIN/SULBACTAM 16 INTERMEDIATE Intermediate     PIP/TAZO 16 SENSITIVE Sensitive     Extended ESBL NEGATIVE Sensitive     * 50,000 COLONIES/mL KLEBSIELLA PNEUMONIAE  Culture, blood (Routine x 2)     Status: None   Collection Time: 01/21/17  8:10 PM  Result Value Ref Range Status   Specimen Description BLOOD LEFT HAND  Final   Special Requests IN PEDIATRIC BOTTLE 4CC  Final   Culture NO GROWTH 5 DAYS  Final   Report Status 01/26/2017 FINAL  Final  Culture, blood (Routine x 2)     Status: Abnormal   Collection Time: 01/21/17  8:20 PM  Result Value Ref Range Status   Specimen Description BLOOD RIGHT ANTECUBITAL  Final   Special Requests BOTTLES DRAWN AEROBIC AND ANAEROBIC 5CC  Final    Culture  Setup Time   Final    GRAM NEGATIVE RODS AEROBIC BOTTLE ONLY CRITICAL RESULT CALLED TO, READ BACK BY AND VERIFIED WITH: E. MARTIN PHARMD, AT 1415 01/22/17 BY D. Leighton Roach  Culture KLEBSIELLA PNEUMONIAE (A)  Final   Report Status 01/24/2017 FINAL  Final   Organism ID, Bacteria KLEBSIELLA PNEUMONIAE  Final      Susceptibility   Klebsiella pneumoniae - MIC*    AMPICILLIN >=32 RESISTANT Resistant     CEFAZOLIN <=4 SENSITIVE Sensitive     CEFEPIME <=1 SENSITIVE Sensitive     CEFTAZIDIME <=1 SENSITIVE Sensitive     CEFTRIAXONE <=1 SENSITIVE Sensitive     CIPROFLOXACIN 1 SENSITIVE Sensitive     GENTAMICIN <=1 SENSITIVE Sensitive     IMIPENEM <=0.25 SENSITIVE Sensitive     TRIMETH/SULFA 40 SENSITIVE Sensitive     AMPICILLIN/SULBACTAM 16 INTERMEDIATE Intermediate     PIP/TAZO 8 SENSITIVE Sensitive     Extended ESBL NEGATIVE Sensitive     * KLEBSIELLA PNEUMONIAE  Blood Culture ID Panel (Reflexed)     Status: Abnormal   Collection Time: 01/21/17  8:20 PM  Result Value Ref Range Status   Enterococcus species NOT DETECTED NOT DETECTED Final   Listeria monocytogenes NOT DETECTED NOT DETECTED Final   Staphylococcus species NOT DETECTED NOT DETECTED Final   Staphylococcus aureus NOT DETECTED NOT DETECTED Final   Streptococcus species NOT DETECTED NOT DETECTED Final   Streptococcus agalactiae NOT DETECTED NOT DETECTED Final   Streptococcus pneumoniae NOT DETECTED NOT DETECTED Final   Streptococcus pyogenes NOT DETECTED NOT DETECTED Final   Acinetobacter baumannii NOT DETECTED NOT DETECTED Final   Enterobacteriaceae species DETECTED (A) NOT DETECTED Final    Comment: Enterobacteriaceae represent a large family of gram-negative bacteria, not a single organism. CRITICAL RESULT CALLED TO, READ BACK BY AND VERIFIED WITH: E. MARTIN PHARMD, AT 1415 01/22/17 BY D. VANHOOK    Enterobacter cloacae complex NOT DETECTED NOT DETECTED Final   Escherichia coli NOT DETECTED NOT DETECTED Final    Klebsiella oxytoca NOT DETECTED NOT DETECTED Final   Klebsiella pneumoniae DETECTED (A) NOT DETECTED Final    Comment: CRITICAL RESULT CALLED TO, READ BACK BY AND VERIFIED WITH: E. MARTIN PHARMD, AT 1415 01/22/17 BY D. VANHOOK    Proteus species NOT DETECTED NOT DETECTED Final   Serratia marcescens NOT DETECTED NOT DETECTED Final   Carbapenem resistance NOT DETECTED NOT DETECTED Final   Haemophilus influenzae NOT DETECTED NOT DETECTED Final   Neisseria meningitidis NOT DETECTED NOT DETECTED Final   Pseudomonas aeruginosa NOT DETECTED NOT DETECTED Final   Candida albicans NOT DETECTED NOT DETECTED Final   Candida glabrata NOT DETECTED NOT DETECTED Final   Candida krusei NOT DETECTED NOT DETECTED Final   Candida parapsilosis NOT DETECTED NOT DETECTED Final   Candida tropicalis NOT DETECTED NOT DETECTED Final  Urine culture     Status: Abnormal   Collection Time: 01/21/17  8:31 PM  Result Value Ref Range Status   Specimen Description URINE, CATHETERIZED  Final   Special Requests NONE  Final   Culture (A)  Final    >=100,000 COLONIES/mL ESCHERICHIA COLI >=100,000 COLONIES/mL KLEBSIELLA PNEUMONIAE    Report Status 01/24/2017 FINAL  Final   Organism ID, Bacteria ESCHERICHIA COLI (A)  Final   Organism ID, Bacteria KLEBSIELLA PNEUMONIAE (A)  Final      Susceptibility   Escherichia coli - MIC*    AMPICILLIN <=2 SENSITIVE Sensitive     CEFAZOLIN <=4 SENSITIVE Sensitive     CEFTRIAXONE <=1 SENSITIVE Sensitive     CIPROFLOXACIN >=4 RESISTANT Resistant     GENTAMICIN <=1 SENSITIVE Sensitive     IMIPENEM <=0.25 SENSITIVE Sensitive     NITROFURANTOIN <=16 SENSITIVE  Sensitive     TRIMETH/SULFA <=20 SENSITIVE Sensitive     AMPICILLIN/SULBACTAM <=2 SENSITIVE Sensitive     PIP/TAZO <=4 SENSITIVE Sensitive     Extended ESBL NEGATIVE Sensitive     * >=100,000 COLONIES/mL ESCHERICHIA COLI   Klebsiella pneumoniae - MIC*    AMPICILLIN >=32 RESISTANT Resistant     CEFAZOLIN <=4 SENSITIVE Sensitive      CEFTRIAXONE <=1 SENSITIVE Sensitive     CIPROFLOXACIN <=0.25 SENSITIVE Sensitive     GENTAMICIN <=1 SENSITIVE Sensitive     IMIPENEM <=0.25 SENSITIVE Sensitive     NITROFURANTOIN 64 INTERMEDIATE Intermediate     TRIMETH/SULFA <=20 SENSITIVE Sensitive     AMPICILLIN/SULBACTAM 4 SENSITIVE Sensitive     PIP/TAZO <=4 SENSITIVE Sensitive     Extended ESBL NEGATIVE Sensitive     * >=100,000 COLONIES/mL KLEBSIELLA PNEUMONIAE  Respiratory Panel by PCR     Status: None   Collection Time: 01/22/17  2:19 AM  Result Value Ref Range Status   Adenovirus NOT DETECTED NOT DETECTED Final   Coronavirus 229E NOT DETECTED NOT DETECTED Final   Coronavirus HKU1 NOT DETECTED NOT DETECTED Final   Coronavirus NL63 NOT DETECTED NOT DETECTED Final   Coronavirus OC43 NOT DETECTED NOT DETECTED Final   Metapneumovirus NOT DETECTED NOT DETECTED Final   Rhinovirus / Enterovirus NOT DETECTED NOT DETECTED Final   Influenza A NOT DETECTED NOT DETECTED Final   Influenza B NOT DETECTED NOT DETECTED Final   Parainfluenza Virus 1 NOT DETECTED NOT DETECTED Final   Parainfluenza Virus 2 NOT DETECTED NOT DETECTED Final   Parainfluenza Virus 3 NOT DETECTED NOT DETECTED Final   Parainfluenza Virus 4 NOT DETECTED NOT DETECTED Final   Respiratory Syncytial Virus NOT DETECTED NOT DETECTED Final   Bordetella pertussis NOT DETECTED NOT DETECTED Final   Chlamydophila pneumoniae NOT DETECTED NOT DETECTED Final   Mycoplasma pneumoniae NOT DETECTED NOT DETECTED Final  Culture, blood (routine x 2)     Status: None (Preliminary result)   Collection Time: 01/24/17 12:24 PM  Result Value Ref Range Status   Specimen Description BLOOD RIGHT ANTECUBITAL  Final   Special Requests IN PEDIATRIC BOTTLE 3CC  Final   Culture NO GROWTH 4 DAYS  Final   Report Status PENDING  Incomplete  Culture, blood (routine x 2)     Status: None (Preliminary result)   Collection Time: 01/24/17 12:24 PM  Result Value Ref Range Status   Specimen  Description BLOOD RIGHT ANTECUBITAL  Final   Special Requests IN PEDIATRIC BOTTLE 2.5CC  Final   Culture NO GROWTH 4 DAYS  Final   Report Status PENDING  Incomplete    Studies/Results: Ct Abdomen Pelvis W Contrast  Result Date: 01/27/2017 CLINICAL DATA:  Subjective fevers, decreased appetite generalized weakness x3 days. EXAM: CT ABDOMEN AND PELVIS WITH CONTRAST TECHNIQUE: Multidetector CT imaging of the abdomen and pelvis was performed using the standard protocol following bolus administration of intravenous contrast. CONTRAST:  ISOVUE-300 IOPAMIDOL (ISOVUE-300) INJECTION 61% COMPARISON:  01/21/2017 chest CT, 07/14/2006 CT abdomen and pelvis report FINDINGS: Lower chest: Small bilateral pleural effusions slightly increased from recent comparison chest CT. Subpleural areas of fibrosis with peripheral ground-glass opacities and emphysematous change of the lung bases are again noted. Hepatobiliary: No focal liver abnormality is seen. No gallstones, gallbladder wall thickening, or biliary dilatation. Pancreas: Mild pancreatic atrophy without ductal dilatation or mass. No peripancreatic inflammation. Spleen: Normal in size without focal abnormality. Adrenals/Urinary Tract: Normal bilateral adrenal glands. No renal mass nor nephrolithiasis.  No obstructive uropathy. Nonspecific mild perinephric fat stranding of both kidneys. The urinary bladder is nondistended minimal wall thickening anteriorly possibly due to underdistention. Stomach/Bowel: Contracted stomach with normal small bowel rotation. No small bowel dilatation or inflammation. Moderate colonic stool burden is seen. Appendectomy per report. There is descending sigmoid diverticulosis without acute diverticulitis. Vascular/Lymphatic: Aortic and branch vessel atherosclerosis. No aneurysm or dissection. Atherosclerotic calcifications at the origins of the celiac axis, SMA and both renal arteries. No enlarged abdominal or pelvic lymph nodes. Reproductive:  Multifocal areas of hypodensity within an enlarged prostate. The prostate measures up to 6.4 cm transverse by 4.3 cm AP. Findings are concerning for changes of prostatitis and potentially involving prostatic abscess sees the largest is within the right lobe measuring 2.6 cm in diameter with smaller foci in left lobe. Other: Mild diffuse soft tissue edema. Musculoskeletal: Degenerative disc disease L2-3, L4-5 and L5-S1. No findings of osteoblastic or osteolytic disease. No acute osseous abnormality. IMPRESSION: 1. Enlarged appearing prostate with multifocal areas of hypodensity within, the largest measuring 2.6 cm on the right. Changes of prostatitis with prostate abscess is of concern. 2. Interval slight increase bilateral pleural effusions with chronic subpleural areas of fibrosis and ground-glass opacity. 3. Aortic and branch vessel atherosclerosis. 4. Degenerative disc disease of the lumbar spine. Electronically Signed   By: Tollie Eth M.D.   On: 01/27/2017 22:59      Assessment/Plan:  INTERVAL HISTORY:   I did not realize when I made rounds that the patient had a CT scan done yesterday which shows evidence of prostatitis and possible prostate abscesses   Principal Problem:   Sepsis (HCC) Active Problems:   3-vessel coronary artery disease   Hyperlipidemia, mixed   Persistent atrial fibrillation (HCC)   Atrial fibrillation (HCC)   Transient hypotension   HCAP (healthcare-associated pneumonia)   Bacteremia due to Klebsiella pneumoniae   Left arm swelling    Alex [name] is a 83 y.o. male with  With complicated PMHX including COPD, A. Fib, CAD s/p CABG, OSA on oxygen at night, s/p PPM, s/p AVR,morbid obesity, and traumatic subdural hematoma requiring craniotomy in 11/2016 now with admission with fever, SIRS, evidence of pneumonia and with 1/2 blood cultures positive for Klebsiella PNA on Ceftriaxone but with fevers. CT done last night though when I discussed with family they wanted to  delay doing it until that got the ultrasound the left upper arm. Regardless the CT scan showed evidence of prostatitis and possible prostate abscess   #1 FUO: may be related to prostate abscess, prostatitis. Organisms isolated in urine are BOTH S to CTX so no need to change therapy based on this finding  Agree to consult with Urology re further steps at this point  #2 Prostatitis and abscess: see above  #3 Klebsiella PNA: Blood cultures are clearing. Gram-negative bacteremia as can be a shorter courses of therapy if the focus of infection is controllable removable. This may not be the case if the prostate abscess is a part of this. Will await input from urology.  #4 Pneumonia: Should've received more than adequate therapy for this  #5 lesion seen on CT of the lungs: Consider follow-up imaging  #6 left upper extremity swelling: Await ultrasound  #7 goals of care continue with primary team and palliative care.   LOS: 6 days   Acey Lav 01/28/2017, 5:40 PM

## 2017-01-28 NOTE — Progress Notes (Signed)
Pt family requested for staff to put pt up in a chair. Staff x2 sat pt up on the side of the bed and attempted to transfer his with gait belt on and walker. Pt unable to stand, stated that he was too weak. Pt put back to bed and repositioned for comfort. No noted distress.

## 2017-01-28 NOTE — Progress Notes (Signed)
Spoke with Md who gave verbal order to dc rectal temps per Urologist recommendation.

## 2017-01-28 NOTE — NC FL2 (Signed)
[place] MEDICAID FL2 LEVEL OF CARE SCREENING TOOL     IDENTIFICATION  Patient Name: Alex [name] Birthdate: 1931-06-10 Sex: male Admission Date (Current Location): 01/21/2017  [facility] and [place] Number:  Producer, television/film/video and Address:  [facility] [place], [address] [place], [place] [address]      Provider Number: [id number]  Attending Physician Name and Address:  Rodolph Bong, MD  Relative Name and Phone Number:       Current Level of Care: Hospital Recommended Level of Care: Skilled Nursing Facility Prior Approval Number:    Date Approved/Denied:   PASRR Number:    Discharge Plan: SNF    Current Diagnoses: Patient Active Problem List   Diagnosis Date Noted  . HCAP (healthcare-associated pneumonia)   . Bacteremia due to Klebsiella pneumoniae   . Left arm swelling   . Transient hypotension 01/22/2017  . Sepsis (HCC) 01/21/2017  . Peripheral edema   . Bacterial UTI   . Urinary retention   . Hypertension   . Reactive hypertension   . Transaminitis   . Traumatic brain injury with loss of consciousness of 31 minutes to 59 minutes (HCC) 12/19/2016  . Acute pulmonary edema (HCC) 12/12/2016  . Acute diastolic CHF (congestive heart failure) (HCC) 12/12/2016  . Atrial flutter (HCC) 12/12/2016  . Traumatic subdural bleed with LOC of 1 hour to 5 hours 59 minutes (HCC) 12/11/2016  . Class 1 obesity due to excess calories with body mass index (BMI) of 30.0 to 30.9 in adult   . ETOH abuse   . Chronic obstructive pulmonary disease (HCC)   . Supplemental oxygen dependent   . Alcohol use   . OSA on CPAP   . Atrial fibrillation (HCC)   . Coronary artery disease involving coronary bypass graft of native heart without angina pectoris   . Seizures (HCC)   . Dysphagia   . Bradycardia   . Hyperglycemia   . Agitation   . Hypokalemia   . Hypernatremia   . Leukocytosis   . Macrocytic anemia   . Thrombocytopenia (HCC)   . Traumatic subdural  hematoma without loss of consciousness (HCC)   . Change in mental status   . Intracranial hematoma (HCC) 12/03/2016  . Sick sinus syndrome (HCC) 11/06/2015  . PAF (paroxysmal atrial fibrillation) (HCC) 11/14/2014  . SOB (shortness of breath) 10/31/2014  . Persistent atrial fibrillation (HCC) 10/31/2014  . Aortic stenosis, severe 07/28/2011  . 3-vessel coronary artery disease 07/28/2011  . Hyperlipidemia, mixed 07/28/2011    Orientation RESPIRATION BLADDER Height & Weight     Self, Time, Situation, Place  O2 (3L) External catheter, Incontinent Weight: 230 lb 11.2 oz (104.6 kg) Height:   (188 cm)  BEHAVIORAL SYMPTOMS/MOOD NEUROLOGICAL BOWEL NUTRITION STATUS      Continent Diet (Heart healthy; thin fluids)  AMBULATORY STATUS COMMUNICATION OF NEEDS Skin   Extensive Assist Verbally Normal                       Personal Care Assistance Level of Assistance  Bathing, Feeding, Dressing Bathing Assistance: Maximum assistance Feeding assistance: Limited assistance Dressing Assistance: Maximum assistance     Functional Limitations Info  Sight, Hearing, Speech Sight Info: Adequate Hearing Info: Adequate Speech Info: Adequate    SPECIAL CARE FACTORS FREQUENCY  PT (By licensed PT), OT (By licensed OT)     PT Frequency: 3x OT Frequency: 3x  Contractures Contractures Info: Not present    Additional Factors Info  Code Status, Allergies, Psychotropic Code Status Info: DNR Allergies Info: No known allergies Psychotropic Info: See med list         Current Medications (01/28/2017):  This is the current hospital active medication list Current Facility-Administered Medications  Medication Dose Route Frequency Provider Last Rate Last Dose  . 0.9 %  sodium chloride infusion   Intravenous Continuous Clydie Braun, MD 75 mL/hr at 01/28/17 0430    . acetaminophen (TYLENOL) tablet 650 mg  650 mg Oral Q6H PRN Clydie Braun, MD   650 mg at 01/26/17 1848  .  ALPRAZolam Prudy Feeler) tablet 0.25 mg  0.25 mg Oral QHS Rondell A Katrinka Blazing, MD   0.25 mg at 01/27/17 2109  . amiodarone (PACERONE) tablet 200 mg  200 mg Oral BID Clydie Braun, MD   200 mg at 01/27/17 2110  . aspirin chewable tablet 81 mg  81 mg Oral Daily Clydie Braun, MD   81 mg at 01/27/17 0906  . atorvastatin (LIPITOR) tablet 40 mg  40 mg Oral Daily Clydie Braun, MD   40 mg at 01/27/17 0906  . cefTRIAXone (ROCEPHIN) 2 g in dextrose 5 % 50 mL IVPB  2 g Intravenous Q24H [name], MD   2 g at 01/27/17 1726  . escitalopram (LEXAPRO) tablet 10 mg  10 mg Oral QHS Clydie Braun, MD   10 mg at 01/27/17 2109  . furosemide (LASIX) tablet 20 mg  20 mg Oral Daily [name], MD   20 mg at 01/27/17 0906  . levETIRAcetam (KEPPRA) tablet 500 mg  500 mg Oral BID Clydie Braun, MD   500 mg at 01/27/17 2109  . magnesium oxide (MAG-OX) tablet 200 mg  200 mg Oral Daily Clydie Braun, MD   200 mg at 01/27/17 0906  . MEDLINE mouth rinse  15 mL Mouth Rinse BID Clydie Braun, MD   15 mL at 01/27/17 2109  . metoprolol succinate (TOPROL-XL) 24 hr tablet 12.5 mg  12.5 mg Oral Daily Clydie Braun, MD   12.5 mg at 01/27/17 1610     Discharge Medications: Please see discharge summary for a list of discharge medications.  Relevant Imaging Results:  Relevant Lab Results:   Additional Information SSN: [id number]  Dominic Pea, LCSW

## 2017-01-28 NOTE — Progress Notes (Signed)
Assisted pt with repositioning for urology rectal exam. Pt uncomfortable during exam. Per urologist, he suggests that staff do not check rectal temp due to prostate concerns. Will notify Md.

## 2017-01-29 ENCOUNTER — Inpatient Hospital Stay (HOSPITAL_COMMUNITY): Payer: Medicare Other | Admitting: Certified Registered Nurse Anesthetist

## 2017-01-29 ENCOUNTER — Inpatient Hospital Stay (HOSPITAL_COMMUNITY): Admission: RE | Admit: 2017-01-29 | Payer: Medicare Other | Source: Ambulatory Visit | Admitting: Urology

## 2017-01-29 ENCOUNTER — Encounter (HOSPITAL_COMMUNITY): Payer: Self-pay

## 2017-01-29 ENCOUNTER — Encounter (HOSPITAL_COMMUNITY): Payer: Medicare Other

## 2017-01-29 ENCOUNTER — Ambulatory Visit (HOSPITAL_COMMUNITY): Payer: Medicare Other

## 2017-01-29 ENCOUNTER — Encounter (HOSPITAL_COMMUNITY)
Admission: EM | Disposition: A | Discharge status: Rehabilitation Facility | Payer: Self-pay | Source: Home / Self Care | Attending: Internal Medicine

## 2017-01-29 DIAGNOSIS — R609 Edema, unspecified: Secondary | ICD-10-CM

## 2017-01-29 DIAGNOSIS — Z6829 Body mass index (BMI) 29.0-29.9, adult: Secondary | ICD-10-CM

## 2017-01-29 DIAGNOSIS — Z820 Family history of epilepsy and other diseases of the nervous system: Secondary | ICD-10-CM

## 2017-01-29 DIAGNOSIS — Z9889 Other specified postprocedural states: Secondary | ICD-10-CM

## 2017-01-29 DIAGNOSIS — M79609 Pain in unspecified limb: Secondary | ICD-10-CM

## 2017-01-29 DIAGNOSIS — N401 Enlarged prostate with lower urinary tract symptoms: Secondary | ICD-10-CM

## 2017-01-29 DIAGNOSIS — Z831 Family history of other infectious and parasitic diseases: Secondary | ICD-10-CM

## 2017-01-29 DIAGNOSIS — N138 Other obstructive and reflux uropathy: Secondary | ICD-10-CM | POA: Diagnosis present

## 2017-01-29 DIAGNOSIS — Z950 Presence of cardiac pacemaker: Secondary | ICD-10-CM

## 2017-01-29 DIAGNOSIS — N412 Abscess of prostate: Secondary | ICD-10-CM

## 2017-01-29 DIAGNOSIS — Z87891 Personal history of nicotine dependence: Secondary | ICD-10-CM

## 2017-01-29 DIAGNOSIS — I480 Paroxysmal atrial fibrillation: Secondary | ICD-10-CM

## 2017-01-29 HISTORY — PX: TRANSURETHRAL INCISION OF PROSTATE: SHX2573

## 2017-01-29 LAB — CBC WITH DIFFERENTIAL/PLATELET
BASOS ABS: 0.1 10*3/uL (ref 0.0–0.1)
Basophils Relative: 1 %
Eosinophils Absolute: 0.1 10*3/uL (ref 0.0–0.7)
Eosinophils Relative: 1 %
HCT: 32.7 % — ABNORMAL LOW (ref 39.0–52.0)
HEMOGLOBIN: 10.1 g/dL — AB (ref 13.0–17.0)
LYMPHS ABS: 2.1 10*3/uL (ref 0.7–4.0)
LYMPHS PCT: 23 %
MCH: 30.1 pg (ref 26.0–34.0)
MCHC: 30.9 g/dL (ref 30.0–36.0)
MCV: 97.3 fL (ref 78.0–100.0)
Monocytes Absolute: 0.7 10*3/uL (ref 0.1–1.0)
Monocytes Relative: 7 %
NEUTROS ABS: 6.2 10*3/uL (ref 1.7–7.7)
NEUTROS PCT: 68 %
Platelets: 245 10*3/uL (ref 150–400)
RBC: 3.36 MIL/uL — ABNORMAL LOW (ref 4.22–5.81)
RDW: 15.5 % (ref 11.5–15.5)
WBC: 9.1 10*3/uL (ref 4.0–10.5)

## 2017-01-29 LAB — BASIC METABOLIC PANEL
ANION GAP: 7 (ref 5–15)
BUN: 11 mg/dL (ref 6–20)
CHLORIDE: 105 mmol/L (ref 101–111)
CO2: 27 mmol/L (ref 22–32)
Calcium: 7.5 mg/dL — ABNORMAL LOW (ref 8.9–10.3)
Creatinine, Ser: 0.63 mg/dL (ref 0.61–1.24)
GFR calc Af Amer: >60 mL/min (ref >60)
GFR calc non Af Amer: >60 mL/min (ref >60)
GLUCOSE: 86 mg/dL (ref 65–99)
POTASSIUM: 3.4 mmol/L — AB (ref 3.5–5.1)
Sodium: 139 mmol/L (ref 135–145)

## 2017-01-29 LAB — CULTURE, BLOOD (ROUTINE X 2)
Culture: NO GROWTH
Culture: NO GROWTH

## 2017-01-29 LAB — HEPATITIS C ANTIBODY (REFLEX): HCV Ab: <0.1 s/co ratio (ref 0.0–0.9)

## 2017-01-29 LAB — HCV COMMENT:

## 2017-01-29 LAB — HEPATITIS B SURFACE ANTIGEN: Hepatitis B Surface Ag: NEGATIVE

## 2017-01-29 SURGERY — INCISION, PROSTATE, TRANSURETHRAL
Anesthesia: General

## 2017-01-29 MED ORDER — FENTANYL CITRATE (PF) 100 MCG/2ML IJ SOLN: 25.0000 ug | INTRAMUSCULAR | Status: DC | PRN

## 2017-01-29 MED ORDER — POTASSIUM CHLORIDE CRYS ER 20 MEQ PO TBCR
40.0000 meq | EXTENDED_RELEASE_TABLET | Freq: Once | ORAL | Status: AC
  Administered 2017-01-29: 40 meq via ORAL
  Filled 2017-01-29: qty 2

## 2017-01-29 MED ORDER — MEPERIDINE HCL 50 MG/ML IJ SOLN: 6.2500 mg | INTRAMUSCULAR | Status: DC | PRN

## 2017-01-29 MED ORDER — SODIUM CHLORIDE 0.9 % IR SOLN
3000.0000 mL | Status: DC
  Administered 2017-01-29 (×3): 3000 mL

## 2017-01-29 MED ORDER — ONDANSETRON HCL 4 MG/2ML IJ SOLN
INTRAMUSCULAR | Status: AC
  Filled 2017-01-29: qty 2

## 2017-01-29 MED ORDER — SODIUM CHLORIDE 0.9 % IV BOLUS (SEPSIS)
500.0000 mL | Freq: Once | INTRAVENOUS | Status: AC
  Administered 2017-01-30: 500 mL via INTRAVENOUS

## 2017-01-29 MED ORDER — ONDANSETRON HCL 4 MG/2ML IJ SOLN
INTRAMUSCULAR | Status: DC | PRN
  Administered 2017-01-29: 4 mg via INTRAVENOUS

## 2017-01-29 MED ORDER — BELLADONNA ALKALOIDS-OPIUM 16.2-60 MG RE SUPP
RECTAL | Status: DC | PRN
  Administered 2017-01-29: 1 via RECTAL

## 2017-01-29 MED ORDER — FENTANYL CITRATE (PF) 100 MCG/2ML IJ SOLN
INTRAMUSCULAR | Status: DC | PRN
  Administered 2017-01-29: 50 ug via INTRAVENOUS

## 2017-01-29 MED ORDER — PROPOFOL 10 MG/ML IV BOLUS
INTRAVENOUS | Status: AC
  Filled 2017-01-29: qty 20

## 2017-01-29 MED ORDER — HYDROCODONE-ACETAMINOPHEN 5-325 MG PO TABS
1.0000 | ORAL_TABLET | ORAL | Status: DC | PRN
  Administered 2017-01-30: 1 via ORAL
  Filled 2017-01-29: qty 1

## 2017-01-29 MED ORDER — PHENYLEPHRINE 40 MCG/ML (10ML) SYRINGE FOR IV PUSH (FOR BLOOD PRESSURE SUPPORT)
PREFILLED_SYRINGE | INTRAVENOUS | Status: DC | PRN
  Administered 2017-01-29: 80 ug via INTRAVENOUS
  Administered 2017-01-29: 120 ug via INTRAVENOUS
  Administered 2017-01-29 (×5): 80 ug via INTRAVENOUS
  Administered 2017-01-29: 120 ug via INTRAVENOUS

## 2017-01-29 MED ORDER — SODIUM CHLORIDE 0.9 % IV SOLN
0.0000 ug/min | INTRAVENOUS | Status: DC
  Filled 2017-01-29: qty 1

## 2017-01-29 MED ORDER — FUROSEMIDE 10 MG/ML IJ SOLN
40.0000 mg | Freq: Once | INTRAMUSCULAR | Status: AC
  Administered 2017-01-29: 40 mg via INTRAVENOUS
  Filled 2017-01-29: qty 4

## 2017-01-29 MED ORDER — LACTATED RINGERS IV SOLN
INTRAVENOUS | Status: AC
  Administered 2017-01-29 (×3): via INTRAVENOUS

## 2017-01-29 MED ORDER — PHENYLEPHRINE HCL 10 MG/ML IJ SOLN
INTRAMUSCULAR | Status: AC
  Filled 2017-01-29: qty 1

## 2017-01-29 MED ORDER — LIDOCAINE 2% (20 MG/ML) 5 ML SYRINGE
INTRAMUSCULAR | Status: DC | PRN
  Administered 2017-01-29: 80 mg via INTRAVENOUS

## 2017-01-29 MED ORDER — ONDANSETRON HCL 4 MG/2ML IJ SOLN: 4.0000 mg | Freq: Once | INTRAMUSCULAR | Status: DC | PRN

## 2017-01-29 MED ORDER — EPHEDRINE SULFATE-NACL 50-0.9 MG/10ML-% IV SOSY
PREFILLED_SYRINGE | INTRAVENOUS | Status: DC | PRN
  Administered 2017-01-29 (×2): 10 mg via INTRAVENOUS

## 2017-01-29 MED ORDER — ONDANSETRON HCL 4 MG/2ML IJ SOLN: 4.0000 mg | INTRAMUSCULAR | Status: DC | PRN

## 2017-01-29 MED ORDER — BACITRACIN-NEOMYCIN-POLYMYXIN 400-5-5000 EX OINT: 1.0000 "application " | TOPICAL_OINTMENT | Freq: Three times a day (TID) | CUTANEOUS | Status: DC | PRN

## 2017-01-29 MED ORDER — PROPOFOL 10 MG/ML IV BOLUS
INTRAVENOUS | Status: DC | PRN
  Administered 2017-01-29: 100 mg via INTRAVENOUS

## 2017-01-29 MED ORDER — BELLADONNA ALKALOIDS-OPIUM 16.2-60 MG RE SUPP
RECTAL | Status: AC
  Filled 2017-01-29: qty 1

## 2017-01-29 MED ORDER — SODIUM CHLORIDE 0.9 % IR SOLN
Status: DC | PRN
  Administered 2017-01-29: 3000 mL

## 2017-01-29 MED ORDER — LIDOCAINE 2% (20 MG/ML) 5 ML SYRINGE
INTRAMUSCULAR | Status: AC
  Filled 2017-01-29: qty 5

## 2017-01-29 SURGICAL SUPPLY — 20 items
BAG URINE DRAINAGE (UROLOGICAL SUPPLIES) ×2 IMPLANT
BAG URO CATCHER STRL LF (MISCELLANEOUS) ×2 IMPLANT
BLADE SURG 15 STRL LF DISP TIS (BLADE) IMPLANT
BLADE SURG 15 STRL SS (BLADE)
CATH SILASTIC FOLEY 20FRX30CC (CATHETERS) ×2 IMPLANT
COVER SURGICAL LIGHT HANDLE (MISCELLANEOUS) IMPLANT
ELECT REM PT RETURN 15FT ADLT (MISCELLANEOUS) ×2 IMPLANT
EVACUATOR MICROVAS BLADDER (UROLOGICAL SUPPLIES) ×2 IMPLANT
GLOVE BIOGEL M 8.0 STRL (GLOVE) ×2 IMPLANT
GOWN STRL REUS W/ TWL XL LVL3 (GOWN DISPOSABLE) ×1 IMPLANT
GOWN STRL REUS W/TWL XL LVL3 (GOWN DISPOSABLE) ×5 IMPLANT
LOOP CUT BIPOLAR 24F LRG (ELECTROSURGICAL) ×2 IMPLANT
MANIFOLD NEPTUNE II (INSTRUMENTS) ×2 IMPLANT
NS IRRIG 1000ML POUR BTL (IV SOLUTION) ×2 IMPLANT
PACK CYSTO (CUSTOM PROCEDURE TRAY) ×2 IMPLANT
SET ASPIRATION TUBING (TUBING) ×2 IMPLANT
SUT ETHILON 3 0 PS 1 (SUTURE) IMPLANT
SYR 30ML LL (SYRINGE) IMPLANT
TUBING CONNECTING 10 (TUBING) ×2 IMPLANT
WIRE COONS/BENSON .038X145CM (WIRE) IMPLANT

## 2017-01-29 NOTE — Progress Notes (Signed)
Report called to [facility] OR RN. Pt transferred to [facility] by [facility] per MD order. Pt is stable @ transfer. Rema Fendt, RN

## 2017-01-29 NOTE — Progress Notes (Signed)
Patient ID: Alex [name], male   DOB: November 19, 1930, 83 y.o.   MRN: [id number]          [facility]  Date of Admission:  01/21/2017   Total days of antibiotics 9        Day 8 ceftriaxone          Principal Problem:   Prostate abscess Active Problems:   Bacteremia due to Klebsiella pneumoniae   3-vessel coronary artery disease   Hyperlipidemia, mixed   Persistent atrial fibrillation (HCC)   Atrial fibrillation (HCC)   Sepsis (HCC)   Transient hypotension   HCAP (healthcare-associated pneumonia)   Left arm swelling   FUO (fever of unknown origin)   Fever   . ALPRAZolam  0.25 mg Oral QHS  . amiodarone  200 mg Oral BID  . aspirin  81 mg Oral Daily  . atorvastatin  40 mg Oral Daily  . cefTRIAXone (ROCEPHIN)  IV  2 g Intravenous Q24H  . escitalopram  10 mg Oral QHS  . furosemide  20 mg Oral Daily  . levETIRAcetam  500 mg Oral BID  . magnesium oxide  200 mg Oral Daily  . mouth rinse  15 mL Mouth Rinse BID  . metoprolol succinate  12.5 mg Oral Daily    SUBJECTIVE: He tells me that he has had a wonderful day.  Review of Systems: Review of Systems  Unable to perform ROS: Mental acuity    Past Medical History:  Diagnosis Date  . Aortic stenosis    s/p AVR by Dr Laneta Simmers  . COPD (chronic obstructive pulmonary disease) (HCC)   . History of coronary artery disease    status post stenting of the marginal circumflex in 12/2003 and again in 2009  . Hyperlipidemia   . Morbid obesity (HCC)    weight 243 pounds, BMI 31.2kg/m2, BSA 2.36 square meters  . Obstructive sleep apnea    compliant with CPAP  . Persistent atrial fibrillation [facility])     Social History  Substance Use Topics  . Smoking status: Former Smoker    Packs/day: 0.50    Years: 30.00    Types: Cigarettes    Start date: 10/27/1973    Quit date: 10/28/2003  . Smokeless tobacco: Never Used  . Alcohol use 0.0 oz/week     Comment: 8 oz of wine per night    Family History  Problem  Relation Age of Onset  . Alzheimer's disease Mother   . Tuberculosis Father   . Other Father     Spinal Meningitis   No Known Allergies  OBJECTIVE: Vitals:   01/29/17 1330 01/29/17 1345 01/29/17 1350 01/29/17 1403  BP: 105/70 117/67  114/66  Pulse: 72 (!) 45 (!) 51   Resp: Temp:    97.6 F (36.4 C)  TempSrc:      SpO2: 97% 97% 96% 94%  Weight:      Height:       Body mass index is 29.72 kg/m.  Physical Exam  Constitutional:  He is very pleasant and talkative. He is sitting up in bed feeding himself lunch.  Cardiovascular: Normal rate and regular rhythm.   No murmur heard. Pulmonary/Chest: Effort normal and breath sounds normal.  Left anterior chest pacemaker site looks good.  Abdominal: Soft. There is no tenderness.  Neurological: He is alert.    Lab Results Lab Results  Component Value Date   WBC 9.1 01/29/2017   HGB 10.1 (L)  01/29/2017   HCT 32.7 (L) 01/29/2017   MCV 97.3 01/29/2017   PLT 245 01/29/2017    Lab Results  Component Value Date   CREATININE 0.63 01/29/2017   BUN 11 01/29/2017   NA 139 01/29/2017   K 3.4 (L) 01/29/2017   CL 105 01/29/2017   CO2 27 01/29/2017    Lab Results  Component Value Date   ALT 87 (H) 01/22/2017   AST 77 (H) 01/22/2017   ALKPHOS 94 01/22/2017   BILITOT 0.8 01/22/2017     Microbiology: Recent Results (from the past 240 hour(s))  Culture, blood (Routine x 2)     Status: None   Collection Time: 01/21/17  8:10 PM  Result Value Ref Range Status   Specimen Description BLOOD LEFT HAND  Final   Special Requests IN PEDIATRIC BOTTLE 4CC  Final   Culture NO GROWTH 5 DAYS  Final   Report Status 01/26/2017 FINAL  Final  Culture, blood (Routine x 2)     Status: Abnormal   Collection Time: 01/21/17  8:20 PM  Result Value Ref Range Status   Specimen Description BLOOD RIGHT ANTECUBITAL  Final   Special Requests BOTTLES DRAWN AEROBIC AND ANAEROBIC 5CC  Final   Culture  Setup Time   Final    GRAM NEGATIVE  RODS AEROBIC BOTTLE ONLY CRITICAL RESULT CALLED TO, READ BACK BY AND VERIFIED WITH: E. MARTIN PHARMD, AT 1415 01/22/17 BY D. VANHOOK    Culture KLEBSIELLA PNEUMONIAE (A)  Final   Report Status 01/24/2017 FINAL  Final   Organism ID, Bacteria KLEBSIELLA PNEUMONIAE  Final      Susceptibility   Klebsiella pneumoniae - MIC*    AMPICILLIN >=32 RESISTANT Resistant     CEFAZOLIN <=4 SENSITIVE Sensitive     CEFEPIME <=1 SENSITIVE Sensitive     CEFTAZIDIME <=1 SENSITIVE Sensitive     CEFTRIAXONE <=1 SENSITIVE Sensitive     CIPROFLOXACIN 1 SENSITIVE Sensitive     GENTAMICIN <=1 SENSITIVE Sensitive     IMIPENEM <=0.25 SENSITIVE Sensitive     TRIMETH/SULFA 40 SENSITIVE Sensitive     AMPICILLIN/SULBACTAM 16 INTERMEDIATE Intermediate     PIP/TAZO 8 SENSITIVE Sensitive     Extended ESBL NEGATIVE Sensitive     * KLEBSIELLA PNEUMONIAE  Blood Culture ID Panel (Reflexed)     Status: Abnormal   Collection Time: 01/21/17  8:20 PM  Result Value Ref Range Status   Enterococcus species NOT DETECTED NOT DETECTED Final   Listeria monocytogenes NOT DETECTED NOT DETECTED Final   Staphylococcus species NOT DETECTED NOT DETECTED Final   Staphylococcus aureus NOT DETECTED NOT DETECTED Final   Streptococcus species NOT DETECTED NOT DETECTED Final   Streptococcus agalactiae NOT DETECTED NOT DETECTED Final   Streptococcus pneumoniae NOT DETECTED NOT DETECTED Final   Streptococcus pyogenes NOT DETECTED NOT DETECTED Final   Acinetobacter baumannii NOT DETECTED NOT DETECTED Final   Enterobacteriaceae species DETECTED (A) NOT DETECTED Final    Comment: Enterobacteriaceae represent a large family of gram-negative bacteria, not a single organism. CRITICAL RESULT CALLED TO, READ BACK BY AND VERIFIED WITH: E. MARTIN PHARMD, AT 1415 01/22/17 BY D. VANHOOK    Enterobacter cloacae complex NOT DETECTED NOT DETECTED Final   Escherichia coli NOT DETECTED NOT DETECTED Final   Klebsiella oxytoca NOT DETECTED NOT DETECTED Final    Klebsiella pneumoniae DETECTED (A) NOT DETECTED Final    Comment: CRITICAL RESULT CALLED TO, READ BACK BY AND VERIFIED WITH: E. MARTIN PHARMD, AT 1415 01/22/17 BY D. Leighton Roach  Proteus species NOT DETECTED NOT DETECTED Final   Serratia marcescens NOT DETECTED NOT DETECTED Final   Carbapenem resistance NOT DETECTED NOT DETECTED Final   Haemophilus influenzae NOT DETECTED NOT DETECTED Final   Neisseria meningitidis NOT DETECTED NOT DETECTED Final   Pseudomonas aeruginosa NOT DETECTED NOT DETECTED Final   Candida albicans NOT DETECTED NOT DETECTED Final   Candida glabrata NOT DETECTED NOT DETECTED Final   Candida krusei NOT DETECTED NOT DETECTED Final   Candida parapsilosis NOT DETECTED NOT DETECTED Final   Candida tropicalis NOT DETECTED NOT DETECTED Final  Urine culture     Status: Abnormal   Collection Time: 01/21/17  8:31 PM  Result Value Ref Range Status   Specimen Description URINE, CATHETERIZED  Final   Special Requests NONE  Final   Culture (A)  Final    >=100,000 COLONIES/mL ESCHERICHIA COLI >=100,000 COLONIES/mL KLEBSIELLA PNEUMONIAE    Report Status 01/24/2017 FINAL  Final   Organism ID, Bacteria ESCHERICHIA COLI (A)  Final   Organism ID, Bacteria KLEBSIELLA PNEUMONIAE (A)  Final      Susceptibility   Escherichia coli - MIC*    AMPICILLIN <=2 SENSITIVE Sensitive     CEFAZOLIN <=4 SENSITIVE Sensitive     CEFTRIAXONE <=1 SENSITIVE Sensitive     CIPROFLOXACIN >=4 RESISTANT Resistant     GENTAMICIN <=1 SENSITIVE Sensitive     IMIPENEM <=0.25 SENSITIVE Sensitive     NITROFURANTOIN <=16 SENSITIVE Sensitive     TRIMETH/SULFA <=20 SENSITIVE Sensitive     AMPICILLIN/SULBACTAM <=2 SENSITIVE Sensitive     PIP/TAZO <=4 SENSITIVE Sensitive     Extended ESBL NEGATIVE Sensitive     * >=100,000 COLONIES/mL ESCHERICHIA COLI   Klebsiella pneumoniae - MIC*    AMPICILLIN >=32 RESISTANT Resistant     CEFAZOLIN <=4 SENSITIVE Sensitive     CEFTRIAXONE <=1 SENSITIVE Sensitive      CIPROFLOXACIN <=0.25 SENSITIVE Sensitive     GENTAMICIN <=1 SENSITIVE Sensitive     IMIPENEM <=0.25 SENSITIVE Sensitive     NITROFURANTOIN 64 INTERMEDIATE Intermediate     TRIMETH/SULFA <=20 SENSITIVE Sensitive     AMPICILLIN/SULBACTAM 4 SENSITIVE Sensitive     PIP/TAZO <=4 SENSITIVE Sensitive     Extended ESBL NEGATIVE Sensitive     * >=100,000 COLONIES/mL KLEBSIELLA PNEUMONIAE  Respiratory Panel by PCR     Status: None   Collection Time: 01/22/17  2:19 AM  Result Value Ref Range Status   Adenovirus NOT DETECTED NOT DETECTED Final   Coronavirus 229E NOT DETECTED NOT DETECTED Final   Coronavirus HKU1 NOT DETECTED NOT DETECTED Final   Coronavirus NL63 NOT DETECTED NOT DETECTED Final   Coronavirus OC43 NOT DETECTED NOT DETECTED Final   Metapneumovirus NOT DETECTED NOT DETECTED Final   Rhinovirus / Enterovirus NOT DETECTED NOT DETECTED Final   Influenza A NOT DETECTED NOT DETECTED Final   Influenza B NOT DETECTED NOT DETECTED Final   Parainfluenza Virus 1 NOT DETECTED NOT DETECTED Final   Parainfluenza Virus 2 NOT DETECTED NOT DETECTED Final   Parainfluenza Virus 3 NOT DETECTED NOT DETECTED Final   Parainfluenza Virus 4 NOT DETECTED NOT DETECTED Final   Respiratory Syncytial Virus NOT DETECTED NOT DETECTED Final   Bordetella pertussis NOT DETECTED NOT DETECTED Final   Chlamydophila pneumoniae NOT DETECTED NOT DETECTED Final   Mycoplasma pneumoniae NOT DETECTED NOT DETECTED Final  Culture, blood (routine x 2)     Status: None   Collection Time: 01/24/17 12:24 PM  Result Value Ref Range Status  Specimen Description BLOOD RIGHT ANTECUBITAL  Final   Special Requests IN PEDIATRIC BOTTLE 3CC  Final   Culture NO GROWTH 5 DAYS  Final   Report Status 01/29/2017 FINAL  Final  Culture, blood (routine x 2)     Status: None   Collection Time: 01/24/17 12:24 PM  Result Value Ref Range Status   Specimen Description BLOOD RIGHT ANTECUBITAL  Final   Special Requests IN PEDIATRIC BOTTLE 2.5CC   Final   Culture NO GROWTH 5 DAYS  Final   Report Status 01/29/2017 FINAL  Final     ASSESSMENT: He had his multiloculated prostate abscess drained this morning. The abscess was the source of his transient Klebsiella bacteremia. I will continue ceftriaxone for now but consider switching to oral trimethoprim sulfamethoxazole soon. I am planning on several weeks of total antibiotic therapy.   PLAN: Continue ceftriaxone for now   Cliffton Asters, MD [facility] [phone] pager   [phone] cell 01/29/2017, 3:56 PM

## 2017-01-29 NOTE — Anesthesia Preprocedure Evaluation (Addendum)
Anesthesia Evaluation  Patient identified by MRN, date of birth, ID band Patient awake    Reviewed: Allergy & Precautions, NPO status , Patient's Chart, lab work & pertinent test results  Airway Mallampati: II       Dental  (+) Upper Dentures, Poor Dentition   Pulmonary former smoker,    Pulmonary exam normal        Cardiovascular Normal cardiovascular exam Rhythm:Regular Rate:Normal     Neuro/Psych negative psych ROS   GI/Hepatic   Endo/Other  negative endocrine ROS  Renal/GU negative Renal ROS     Musculoskeletal   Abdominal Normal abdominal exam  (+)   Peds  Hematology   Anesthesia Other Findings [place]  CUP PACEART [employer] DEVICE [place] [id number]  Ordering physician: Hillis Range, MD Study date: 11/17/16 Conclusion   Pacemaker check in clinic. Normal device function. Thresholds, sensing, impedances consistent with previous measurements. Device programmed to maximize longevity. 1 mode switch (100% burden) + coumadin. No high ventricular rates noted. Device programmed  at appropriate safety margins. Histogram distribution appropriate for patient activity level. Device programmed to optimize intrinsic conduction. Estimated longevity 6.5-92yrs. Remote check 4/11 and ROV with JA for Cardioversion in 4wks. Patient education completed.Olivia Mackie,   This EEG is abnormal due to the presence of: 1. Moderate diffuse slowing of the waking background 2. Additional focal slowing over the right hemisphere 3. Occasional low amplitude epileptiform discharges over the right posterior temporal region 4. Three clinicoelectrographic seizures captured arising from the right hemisphere lasting 2-4 minutes  Clinical Correlation of the above findings indicates diffuse cerebral dysfunction that is non-specific in etiology and can be seen with hypoxic/ischemic injury, toxic/metabolic encephalopathies, or medication effect.  Additional focal slowing over the right hemisphere indicates focal cerebral dysfunction in this region suggestive of underlying structural or physiologic abnormality. There were 3 clinicoelectrographic seizures captured lateralizing to the right hemisphere consistent with status epilepticus with patient not returning to baseline in between seizures. Clinical correlation is advised.  Study Result   Result status: Final result               *[place]*         *[facility]*            [address]            [place], [place] [address]              [phone]  ------------------------------------------------------------------- Transesophageal Echocardiography with Cardioversion  Patient:  Alex [name], Alex [name] MR #:    [id number] Study Date: 11/14/2014 Gender:   M Age:    83 Height:   188 cm Weight:   101.4 kg BSA:    2.32 m^2 Pt. Status: Room:  ADMITTING  Donato Schultz, M.D. ATTENDING  Donato Schultz, M.D. PERFORMING  Donato Schultz, M.D. ORDERING   Hillis Range, MD REFERRING  Hillis Range, MD SONOGRAPHER Jimmy Reel, RDCS  cc:  ------------------------------------------------------------------- LV EF: 55% -  60%  ------------------------------------------------------------------- Indications:   Atrial fibrillation - 427.31.  ------------------------------------------------------------------- History:  PMH:  Atrial fibrillation.  ------------------------------------------------------------------- Study Conclusions  - Left ventricle: The cavity size was normal. Wall thickness was normal. Systolic function was normal. The estimated ejection fraction was in the range of 55% to 60%. No evidence of thrombus. - Aortic valve: A bioprosthesis was present and functioning normally. No evidence of vegetation. There was trivial regurgitation. - Mitral valve: No  evidence of vegetation. There was mild regurgitation. - Left atrium: No evidence of  thrombus in the atrial cavity or appendage. No evidence of thrombus in the appendage. - Right atrium: No evidence of thrombus in the atrial cavity or appendage. - Atrial septum: No defect or patent foramen ovale was identified. - Tricuspid valve: No evidence of vegetation. - Pulmonic valve: No evidence of vegetation.  Impressions:  - Successful cardioversion    Reproductive/Obstetrics                            Anesthesia Physical Anesthesia Plan  ASA: IV  Anesthesia Plan: General   Post-op Pain Management:    Induction: Intravenous  Airway Management Planned: LMA  Additional Equipment:   Intra-op Plan:   Post-operative Plan:   Informed Consent: I have reviewed the patients History and Physical, chart, labs and discussed the procedure including the risks, benefits and alternatives for the proposed anesthesia with the patient or authorized representative who has indicated his/her understanding and acceptance.   Dental advisory given  Plan Discussed with: CRNA and Surgeon  Anesthesia Plan Comments: (In the event we need to intubate the patient, the patient would like [place] to do so.)        Anesthesia Quick Evaluation

## 2017-01-29 NOTE — Anesthesia Postprocedure Evaluation (Addendum)
Anesthesia Post Note  Patient: Alex [name]  Procedure(s) Performed: Procedure(s) (LRB): TRANSURETHRAL UNROOFING OF A  PROSTATE ABSCESS (N/A)  Patient location during evaluation: PACU Anesthesia Type: General Level of consciousness: awake and sedated Pain management: pain level controlled Vital Signs Assessment: post-procedure vital signs reviewed and stable Respiratory status: spontaneous breathing Cardiovascular status: stable Postop Assessment: no signs of nausea or vomiting Anesthetic complications: no        Last Vitals:  Vitals:   01/29/17 1245 01/29/17 1300  BP: 110/77 103/85  Pulse: (!) 106 81  Resp: (!) 25 17  Temp:      Last Pain:  Vitals:   01/29/17 1130  TempSrc:   PainSc: Asleep   Pain Goal: Patients Stated Pain Goal:  ( pt. unable to tell) (01/29/17 0924)               Lesean Woolverton JR,Allen Susann Givens

## 2017-01-29 NOTE — Progress Notes (Signed)
Dr Arby Barrette aware pt BP of  84/69.  Instructed to start neo gtt.

## 2017-01-29 NOTE — Anesthesia Procedure Notes (Signed)
Procedure Name: LMA Insertion Date/Time: 01/29/2017 10:04 AM Performed by: Epimenio Sarin Pre-anesthesia Checklist: Patient identified, Emergency Drugs available, Suction available, Patient being monitored and Timeout performed Patient Re-evaluated:Patient Re-evaluated prior to inductionOxygen Delivery Method: Circle system utilized Preoxygenation: Pre-oxygenation with 100% oxygen Intubation Type: IV induction Ventilation: Mask ventilation without difficulty and Oral airway inserted - appropriate to patient size LMA: LMA with gastric port inserted LMA Size: 5.0 Number of attempts: 1 Dental Injury: Teeth and Oropharynx as per pre-operative assessment

## 2017-01-29 NOTE — Op Note (Signed)
PATIENT:  Alex [name]  PRE-OPERATIVE DIAGNOSIS: Prostatic abscess  POST-OPERATIVE DIAGNOSIS: Same  PROCEDURE: Transurethral resection of prostate/unroofing of prostatic abscess  SURGEON:  G[place]  INDICATION: Alex [name] is a 83 year old male who was admitted to the hospital with what appeared to be a UTI which eventually was culture proven to be due to Klebsiella and Escherichia coli. He's been on appropriate antibiotics but continued to have fever. A CT scan revealed what appeared to be an abscess in the right lobe of his prostate. examination confirmed his right lobe to be fluctuant and tender. He is brought to the operating room for transurethral resection of the prostate and unroofing of his abscess.  ANESTHESIA:  General  EBL:  Minimal  Drain: 20 French three-way Foley catheter   SPECIMEN:   prostate chips to pathology  Findings: BPH with outlet obstruction. He had a large pocket of purulent material in the right mid to apical region that was completely unroofed and drained of grossly purulent material with no further pocket identified and complete drainage as well as unroofing of the abscess cavity.  Description of procedure: After informed consent the patient was taken to the operating room and placed on the table in a supine position. General anesthesia was then administered. Once fully anesthetized the patient was moved to the dorsal lithotomy position and the genitalia were sterilely prepped and draped in standard fashion. An official timeout was then performed.   the 75 French resectoscope with 30 lens and visual obturator were then advanced down the urethra under direct visualization. Urethra was noted be normal. The prostatic urethra revealed bilobar hypertrophy with elongation of the prostatic urethra but there were no definite lesions seen. The bladder was then entered. 4+ trabeculation was identified that there were no tumors, stones or inflammatory lesions. The  ureteral orifices were of normal configuration and position and awakened from the bladder neck.  The prostatic abscess was noted to be on the right side of his prostate with no abscess on the left and therefore I began resecting the right lobe of the prostate from the bladder neck back to the level of the veru. I encountered several small pockets of purulence a couple millimeters in size until I eventually hit a very large pocket that emanated thick purulent material. This was photographed. I then opened this cavity up completely and probed the entire inside of the cavity to be sure there were no loculations. I then placed my index finger in the rectum and pushed up on the prostate from the apex to the base and lateral to medial in order to milk the prostate while visualizing the abscess cavity to be sure there were no areas of further purulent material present. None were identified. Bleeding points were point cauterized. I then used the Microvasive evacuator to remove all the prostatic chips from the bladder and then reinspected and noted the ureteral orifices were well away from the area of resection. There was no active bleeding and the abscess pocket was completely unroofed. I therefore removed the resectoscope and used a catheter guide with the 20 French Foley catheter placed and the catheter in the bladder I filled the balloon and connected this to continuous irrigation and closed system drainage and the patient was awakened and taken to the recovery room in stable and satisfactory condition. He tolerated procedure well no intraoperative complications.   PLAN  OF CARE: Discharge to home after PACU  PATIEnT DISPOSITION:  PACU - hemodynamically stable.

## 2017-01-29 NOTE — Progress Notes (Addendum)
Pt arrived to unit, a&ox3. LUE edematous red. Pt request all rails be up

## 2017-01-29 NOTE — Progress Notes (Signed)
  PT Cancellation Note  Patient Details Name: MARIK SEDORE MRN: [id number] DOB: 1931-05-05   Cancelled Treatment:    Reason Eval/Treat Not Completed: Patient at procedure or test/unavailable;Other (comment) Pt transferred to [facility] for procedure and to be admitted there per RN. PT at [facility] to follow up.   Margot Chimes, PT, DPT  Acute Rehabilitation Services  Pager: [phone]  Melvyn Novas 01/29/2017, 9:25 AM

## 2017-01-29 NOTE — Progress Notes (Signed)
Palliative Medicine Team Progress note  83 y.o. male  with past medical history of COPD, A. Fib, CAD s/p CABG, OSA on oxygen at night, s/p PPM, s/p AVR,morbid obesity. He was recently hospitalized in 11/2016 for traumatic subdural hematoma requiring craniotomy. He had prolonged rehabilitation stay from which he was discharged home with 24-hour nursing care approx 2 weeks ago, now admitted on 01/21/2017 with sepsis, UTI, PNA, and klebsiella bacteremia.  Palliative consulted for goals of care.   Elderly gentleman resting in bed Awake alert this afternoon, responds appropriately  BP 114/66 (BP Location: Right Arm)   Pulse (!) 51   Temp 97.6 F (36.4 C)   Resp 16   Ht  (1.88 m)   Wt 105 kg (231 lb 8 oz)   SpO2 94%   BMI 29.72 kg/m  Labs noted.  Elderly gentleman resting in bed Upper extremity edema bilateral, L worse than R   awake/alert S1 S2 Abdomen soft Clear   breath sounds       Discussed with wife at the bedside. Wife is thankful for the information received from urology and that the patient tolerated procedure well. She is asking about the Left upper extremity venous doppler ultrasound which was ordered at [facility] to check for DVT in LUE as the patient had LUE swelling and persistent fevers. I have re ordered it here at [facility], I have discussed with bedside RN.    Palliative is following peripherally, to be an extra layer of support for the family.   Offered active listening and supportive care, supporting the family as they continue to address goals of care, based on the patient's disease trajectory.   15 minutes spent.  Rosalin Hawking MD [facility] health palliative medicine team [phone]

## 2017-01-29 NOTE — Transfer of Care (Signed)
Immediate Anesthesia Transfer of Care Note  Patient: Alex [name]  Procedure(s) Performed: Procedure(s): TRANSURETHRAL UNROOFING OF A  PROSTATE ABSCESS (N/A)  Patient Location: PACU  Anesthesia Type:General  Level of Consciousness:  sedated, patient cooperative and responds to stimulation  Airway & Oxygen Therapy:Patient Spontanous Breathing and Patient connected to face mask oxgen  Post-op Assessment:  Report given to PACU RN and Post -op Vital signs reviewed and stable  Post vital signs:  Reviewed and stable  Last Vitals:  Vitals:   01/29/17 1055 01/29/17 1100  BP: (!) 83/52 92/63  Pulse: 77 98  Resp: 18 (!) 29  Temp:      Complications: No apparent anesthesia complications

## 2017-01-29 NOTE — Progress Notes (Signed)
OT Cancellation Note  Patient Details Name: KEN BONN MRN: [id number] DOB: 17-Aug-1931   Cancelled Treatment:    Reason Eval/Treat Not Completed: Other (comment)  Noted pt had sx today. Will check back tomorrow  Roux Brandy 01/29/2017, 12:38 PM  Marica Otter, OTR/L [phone] 01/29/2017

## 2017-01-29 NOTE — Discharge Instructions (Signed)

## 2017-01-29 NOTE — Progress Notes (Signed)
PT Cancellation Note  Patient Details Name: BREAKER SPRINGER MRN: [id number] DOB: 1930-10-29   Cancelled Treatment:     pt is a transfer from [facility].  Had surgery this morning then this afternoon out of room for ultrsound B UE's r/o DVT. Pt has been evaluated on 3/30 By LPT with rec for CIR.  Will check back another day as schedule permits.    Felecia Shelling  PTA [facility]  Acute  Rehab Pager      [phone]

## 2017-01-29 NOTE — Interval H&P Note (Signed)
No changes to above H&P over the <24 hr. period since I saw him last. He had no further questions about the surgery which has not changed.

## 2017-01-29 NOTE — Progress Notes (Signed)
PROGRESS NOTE    Alex [name]  VZC:[id number] DOB: October 31, 1930 DOA: 01/21/2017 PCP: Derrill Center, MD    Brief Narrative:  83 y.o.malewith medical history significant for COPD, A. Fib, CAD s/p CABG, OSA on oxygen at night, s/p PPM, s/p AVR,morbid obesity. He was recently hospitalized in 11/2016 for traumatic subdural hematoma requiring craniotomy. He had prolonged rehabilitation stay for which he was just recently discharged home with 24-hour nursing care 1-1/2weeks ago PTA. Patient has chronic atrial fibrillation and had anunsuccessful trial of cardioversion on 1/25 with Dr. Harrington Challenger. Following the subdural hematoma patient was taken off of Coumadin and has only been on a baby aspirin daily.   Pt presented to [facility] with generalized weakness for past 3 days prior to this admission. He also was more confused especially in the evenings. Pt reported having subjective fevers, decreased appetite.  In ED, pt had fever of 101.45F, pulse rate 38-81, respirations 17-28, blood pressure as low as 80/42, and O2 satsmaintained above 90% on 2 L. Labs revealed normal WBC, hemoglobin 10.7, albumin 1.7, AST 95, ALT 97, lactic acid 1.98, and all other labs relatively unremarkable. Urinalysis showed trace leukocytes. CXR showed bibasilar infiltrates, possible aspiration. No acute findings on CT head. He was started on empiric abx for UTI and pneumonia.  Barrier to discharge: Dr Charlies Silvers spoke with pt daughter over the phone; I have talked to her about the plan of care (obtaining ID consultation, LUE doppler), we also talked about how plan is for continuing to monitor fever curve, not sure why is he still spiking fever. I assured pt daughter that there was no fever yesterday but she said he has fever every single day rectally measured. I also let pt know of my cell# to call me for questions or concerns. She then mentioned to me that I am not working tomorrow "I looked you up on amion". I still offered her to call me as I  am not sure what my schedule will be tomorrow.  ID was consulted and left upper extremity Dopplers obtained due to swelling left upper extremity however pending at this time. CT abdomen and pelvis which was obtained was worrisome for prostatitis and prostatic abscess. Urology was consulted assessed the patient and recommended unroofing of prostatic abscess to be done at [facility] on 01/29/2017.  Assessment & Plan:   Assessment & Plan:   Principal Problem:   Sepsis ([place]) Active Problems:   Prostate abscess   3-vessel coronary artery disease   Hyperlipidemia, mixed   Persistent atrial fibrillation (HCC)   Atrial fibrillation (HCC)   Transient hypotension   HCAP (healthcare-associated pneumonia)   Bacteremia due to Klebsiella pneumoniae   Left arm swelling   FUO (fever of unknown origin)   Fever    Sepsis secondary to bibasilar pneumonia, possible aspiration pneumonitis / UTI secondary to E.COli and Klebsiella pneumoniae ([place]) / Klebsiella pneumoniae bacteremia/prostatitis and prostatic abscess. - Sepsis criteria met on admission with fever, tachypnea, tachycardia, hypotension. Source of infection is pneumonia and UTI and prostatitis/prostatic abscess noted on CT abdomen and pelvis 01/27/2017. - Urine cx grew E.Coli and Klebsiella pneumoniae - Blood cx grew Klebsiella pneumoniae - Repeat blood cx 3/31 so far showed no growth  - Resp virus panel negative  - Vanco and zosyn stopped 3/29 and narrowed to rocephin  - Pt spiked fever 01/27/2017 and as such ID was consulted whose recommendations at that time was CT abdomen and pelvis. Family was hesitant to get CT abdomen and pelvis per  ID note, however this was obtained on 01/27/2017 which was positive for prostatitis and prostatic abscess. Patient was in consultation by urology and noted also by physical exam to have confirmed fluctuance in right lobe of prostate with exquisite TTP of this area. Urology recommended unroofing of  prostatic abscess in the operating room today 01/29/2017 at [facility]. Continue current IV antibiotics. ID and urology following and appreciate input and recommendations.   Prostatitis and prostatic abscess Per CT abdomen and pelvis 01/27/2017. Patient also noted to be spiking fevers. Urology has been consulted and recommended unroofing of prostatic abscess to be done in the operating room at [facility] today 01/29/2017 per Dr.Ottelin. Continue current empiric IV antibiotics. ID following.  Active Problems:  Left upper extremity swelling - Some improvement with left upper extremity swelling. Upper extremity Dopplers pending. Due to recent subdural hematoma requiring craniotomy February 2018 patient not a candidate for anticoagulation at this time.     Persistent atrial fibrillation (HCC) - CHADS vasc score at least 3 - Currently rate controlled on metoprolol and amiodarone. Not on coumadin due to subdural hematoma; he is on aspirin 81 mg daily    Chronic diastolic CHF - Compensated - Continue daily lasix       Essential hypertension - Continue metoprolol     Dyslipidemia - Continue Lipitor     Depression  - Continue Lexapro     Seizure disorder - Patient with no noted seizures. - Continue Keppra  - Continue xanax at bedtime   -Fever Likely secondary to prostatitis and prostatic abscess. Please see above.    DVT prophylaxis: SCDs Code Status: DO NOT RESUSCITATE Family Communication:  Updated. Spoke to wife via telephone.  Disposition Plan: Final disposition pending workup likely skilled nursing facility once medically stable. Patient to be transferred to [facility] today for unroofing of prostatic abscess.   Consultants:   Inpatient rehabilitation Dr.Swartz 01/26/2017  Palliative care: Dr. Domingo Cocking 01/26/2017  Infectious diseases: Dr. Tommy Medal 01/27/2017  Urology: Dr.Ottelin 01/28/2017  Procedures:   CT abdomen and pelvis  01/27/2017  Left upper extremity Dopplers pending.  Chest x-ray 01/25/2017, 01/21/2017  Antimicrobials:   IV Rocephin 01/22/2017  IV Zosyn 01/21/2017  IV vancomycin 01/21/2017>>> 01/22/2017   Subjective: Patient denies any chest. No shortness of breath. Patient states feeling better. Patient states he knows he is having an operation today.  Objective: Vitals:   01/28/17 1311 01/28/17 2145 01/29/17 0129 01/29/17 0456  BP: 97/60 137/66 115/69 112/62  Pulse: (!) 33 77 (!) 105 63  Resp: _0 Temp: 98.4 F (36.9 C) 98.3 F (36.8 C) 98.1 F (36.7 C) 98.1 F (36.7 C)  TempSrc: Oral Oral Oral Oral  SpO2: 92% 97% 95% 95%  Weight:    105 kg (231 lb 8 oz)  Height:        Intake/Output Summary (Last 24 hours) at 01/29/17 0758 Last data filed at 01/29/17 0459  Gross per 24 hour  Intake                0 ml  Output             1000 ml  Net            -1000 ml   Filed Weights   01/27/17 0537 01/28/17 0431 01/29/17 0456  Weight: 105.6 kg (232 lb 12.9 oz) 104.6 kg (230 lb 11.2 oz) 105 kg (231 lb 8 oz)    Examination:  General exam: Appears  calm and comfortable.Dry mucous membranes. Respiratory system: Clear to auscultation anterior lung fields. Respiratory effort normal. Cardiovascular system: S1 & S2 heard, RRR. No JVD, murmurs, rubs, gallops or clicks. No pedal edema. Gastrointestinal system: Abdomen is nondistended, soft and nontender. No organomegaly or masses felt. Normal bowel sounds heard. Central nervous system: Alert and oriented. No focal neurological deficits. Extremities: Symmetric 5 x 5 power. Skin: No rashes, lesions or ulcers Psychiatry: Judgement and insight appear normal. Mood & affect appropriate.     Data Reviewed: I have personally reviewed following labs and imaging studies  CBC:  Recent Labs Lab 01/24/17 0523 01/25/17 0309 01/26/17 0620 01/28/17 0306 01/29/17 0318  WBC 7.8 8.3 8.5 8.5 9.1  NEUTROABS  --   --   --   --  6.2  HGB  10.4* 10.7* 10.1* 10.4* 10.1*  HCT 31.9* 32.8* 30.4* 31.8* 32.7*  MCV 95.8 95.6 93.8 96.1 97.3  PLT 207 236 233 240 800   Basic Metabolic Panel:  Recent Labs Lab 01/24/17 0523 01/25/17 0309 01/26/17 0620 01/28/17 0306 01/29/17 0318  NA 138 134* 136 140 139  K 3.9 3.5 4.0 3.5 3.4*  CL 103 101 103 103 105  CO2 _0 GLUCOSE 88 92 89 88 86  BUN _1 CREATININE 0.74 0.64 0.70 0.70 0.63  CALCIUM 7.8* 7.7* 7.6* 7.7* 7.5*   GFR: Estimated Creatinine Clearance: 88.8 mL/min (by C-G formula based on SCr of 0.63 mg/dL). Liver Function Tests: No results for input(s): AST, ALT, ALKPHOS, BILITOT, PROT, ALBUMIN in the last 168 hours. No results for input(s): LIPASE, AMYLASE in the last 168 hours. No results for input(s): AMMONIA in the last 168 hours. Coagulation Profile: No results for input(s): INR, PROTIME in the last 168 hours. Cardiac Enzymes: No results for input(s): CKTOTAL, CKMB, CKMBINDEX, TROPONINI in the last 168 hours. BNP (last 3 results) No results for input(s): PROBNP in the last 8760 hours. HbA1C: No results for input(s): HGBA1C in the last 72 hours. CBG: No results for input(s): GLUCAP in the last 168 hours. Lipid Profile: No results for input(s): CHOL, HDL, LDLCALC, TRIG, CHOLHDL, LDLDIRECT in the last 72 hours. Thyroid Function Tests: No results for input(s): TSH, T4TOTAL, FREET4, T3FREE, THYROIDAB in the last 72 hours. Anemia Panel: No results for input(s): VITAMINB12, FOLATE, FERRITIN, TIBC, IRON, RETICCTPCT in the last 72 hours. Sepsis Labs: No results for input(s): PROCALCITON, LATICACIDVEN in the last 168 hours.  Recent Results (from the past 240 hour(s))  Culture, blood (Routine x 2)     Status: None   Collection Time: 01/21/17  8:10 PM  Result Value Ref Range Status   Specimen Description BLOOD LEFT HAND  Final   Special Requests IN PEDIATRIC BOTTLE 4CC  Final   Culture NO GROWTH 5 DAYS  Final   Report Status 01/26/2017 FINAL   Final  Culture, blood (Routine x 2)     Status: Abnormal   Collection Time: 01/21/17  8:20 PM  Result Value Ref Range Status   Specimen Description BLOOD RIGHT ANTECUBITAL  Final   Special Requests BOTTLES DRAWN AEROBIC AND ANAEROBIC 5CC  Final   Culture  Setup Time   Final    GRAM NEGATIVE RODS AEROBIC BOTTLE ONLY CRITICAL RESULT CALLED TO, READ BACK BY AND VERIFIED WITH: E. MARTIN PHARMD, AT 1415 01/22/17 BY D. VANHOOK    Culture KLEBSIELLA PNEUMONIAE (A)  Final   Report Status 01/24/2017 FINAL  Final   Organism ID, Bacteria  KLEBSIELLA PNEUMONIAE  Final      Susceptibility   Klebsiella pneumoniae - MIC*    AMPICILLIN >=32 RESISTANT Resistant     CEFAZOLIN <=4 SENSITIVE Sensitive     CEFEPIME <=1 SENSITIVE Sensitive     CEFTAZIDIME <=1 SENSITIVE Sensitive     CEFTRIAXONE <=1 SENSITIVE Sensitive     CIPROFLOXACIN 1 SENSITIVE Sensitive     GENTAMICIN <=1 SENSITIVE Sensitive     IMIPENEM <=0.25 SENSITIVE Sensitive     TRIMETH/SULFA 40 SENSITIVE Sensitive     AMPICILLIN/SULBACTAM 16 INTERMEDIATE Intermediate     PIP/TAZO 8 SENSITIVE Sensitive     Extended ESBL NEGATIVE Sensitive     * KLEBSIELLA PNEUMONIAE  Blood Culture ID Panel (Reflexed)     Status: Abnormal   Collection Time: 01/21/17  8:20 PM  Result Value Ref Range Status   Enterococcus species NOT DETECTED NOT DETECTED Final   Listeria monocytogenes NOT DETECTED NOT DETECTED Final   Staphylococcus species NOT DETECTED NOT DETECTED Final   Staphylococcus aureus NOT DETECTED NOT DETECTED Final   Streptococcus species NOT DETECTED NOT DETECTED Final   Streptococcus agalactiae NOT DETECTED NOT DETECTED Final   Streptococcus pneumoniae NOT DETECTED NOT DETECTED Final   Streptococcus pyogenes NOT DETECTED NOT DETECTED Final   Acinetobacter baumannii NOT DETECTED NOT DETECTED Final   Enterobacteriaceae species DETECTED (A) NOT DETECTED Final    Comment: Enterobacteriaceae represent a large family of gram-negative bacteria, not a  single organism. CRITICAL RESULT CALLED TO, READ BACK BY AND VERIFIED WITH: E. MARTIN PHARMD, AT 1914 01/22/17 BY D. VANHOOK    Enterobacter cloacae complex NOT DETECTED NOT DETECTED Final   Escherichia coli NOT DETECTED NOT DETECTED Final   Klebsiella oxytoca NOT DETECTED NOT DETECTED Final   Klebsiella pneumoniae DETECTED (A) NOT DETECTED Final    Comment: CRITICAL RESULT CALLED TO, READ BACK BY AND VERIFIED WITH: E. MARTIN PHARMD, AT 1415 01/22/17 BY D. VANHOOK    Proteus species NOT DETECTED NOT DETECTED Final   Serratia marcescens NOT DETECTED NOT DETECTED Final   Carbapenem resistance NOT DETECTED NOT DETECTED Final   Haemophilus influenzae NOT DETECTED NOT DETECTED Final   Neisseria meningitidis NOT DETECTED NOT DETECTED Final   Pseudomonas aeruginosa NOT DETECTED NOT DETECTED Final   Candida albicans NOT DETECTED NOT DETECTED Final   Candida glabrata NOT DETECTED NOT DETECTED Final   Candida krusei NOT DETECTED NOT DETECTED Final   Candida parapsilosis NOT DETECTED NOT DETECTED Final   Candida tropicalis NOT DETECTED NOT DETECTED Final  Urine culture     Status: Abnormal   Collection Time: 01/21/17  8:31 PM  Result Value Ref Range Status   Specimen Description URINE, CATHETERIZED  Final   Special Requests NONE  Final   Culture (A)  Final    >=100,000 COLONIES/mL ESCHERICHIA COLI >=100,000 COLONIES/mL KLEBSIELLA PNEUMONIAE    Report Status 01/24/2017 FINAL  Final   Organism ID, Bacteria ESCHERICHIA COLI (A)  Final   Organism ID, Bacteria KLEBSIELLA PNEUMONIAE (A)  Final      Susceptibility   Escherichia coli - MIC*    AMPICILLIN <=2 SENSITIVE Sensitive     CEFAZOLIN <=4 SENSITIVE Sensitive     CEFTRIAXONE <=1 SENSITIVE Sensitive     CIPROFLOXACIN >=4 RESISTANT Resistant     GENTAMICIN <=1 SENSITIVE Sensitive     IMIPENEM <=0.25 SENSITIVE Sensitive     NITROFURANTOIN <=16 SENSITIVE Sensitive     TRIMETH/SULFA <=20 SENSITIVE Sensitive     AMPICILLIN/SULBACTAM <=2  SENSITIVE Sensitive  PIP/TAZO <=4 SENSITIVE Sensitive     Extended ESBL NEGATIVE Sensitive     * >=100,000 COLONIES/mL ESCHERICHIA COLI   Klebsiella pneumoniae - MIC*    AMPICILLIN >=32 RESISTANT Resistant     CEFAZOLIN <=4 SENSITIVE Sensitive     CEFTRIAXONE <=1 SENSITIVE Sensitive     CIPROFLOXACIN <=0.25 SENSITIVE Sensitive     GENTAMICIN <=1 SENSITIVE Sensitive     IMIPENEM <=0.25 SENSITIVE Sensitive     NITROFURANTOIN 64 INTERMEDIATE Intermediate     TRIMETH/SULFA <=20 SENSITIVE Sensitive     AMPICILLIN/SULBACTAM 4 SENSITIVE Sensitive     PIP/TAZO <=4 SENSITIVE Sensitive     Extended ESBL NEGATIVE Sensitive     * >=100,000 COLONIES/mL KLEBSIELLA PNEUMONIAE  Respiratory Panel by PCR     Status: None   Collection Time: 01/22/17  2:19 AM  Result Value Ref Range Status   Adenovirus NOT DETECTED NOT DETECTED Final   Coronavirus 229E NOT DETECTED NOT DETECTED Final   Coronavirus HKU1 NOT DETECTED NOT DETECTED Final   Coronavirus NL63 NOT DETECTED NOT DETECTED Final   Coronavirus OC43 NOT DETECTED NOT DETECTED Final   Metapneumovirus NOT DETECTED NOT DETECTED Final   Rhinovirus / Enterovirus NOT DETECTED NOT DETECTED Final   Influenza A NOT DETECTED NOT DETECTED Final   Influenza B NOT DETECTED NOT DETECTED Final   Parainfluenza Virus 1 NOT DETECTED NOT DETECTED Final   Parainfluenza Virus 2 NOT DETECTED NOT DETECTED Final   Parainfluenza Virus 3 NOT DETECTED NOT DETECTED Final   Parainfluenza Virus 4 NOT DETECTED NOT DETECTED Final   Respiratory Syncytial Virus NOT DETECTED NOT DETECTED Final   Bordetella pertussis NOT DETECTED NOT DETECTED Final   Chlamydophila pneumoniae NOT DETECTED NOT DETECTED Final   Mycoplasma pneumoniae NOT DETECTED NOT DETECTED Final  Culture, blood (routine x 2)     Status: None (Preliminary result)   Collection Time: 01/24/17 12:24 PM  Result Value Ref Range Status   Specimen Description BLOOD RIGHT ANTECUBITAL  Final   Special Requests IN  PEDIATRIC BOTTLE 3CC  Final   Culture NO GROWTH 4 DAYS  Final   Report Status PENDING  Incomplete  Culture, blood (routine x 2)     Status: None (Preliminary result)   Collection Time: 01/24/17 12:24 PM  Result Value Ref Range Status   Specimen Description BLOOD RIGHT ANTECUBITAL  Final   Special Requests IN PEDIATRIC BOTTLE 2.5CC  Final   Culture NO GROWTH 4 DAYS  Final   Report Status PENDING  Incomplete         Radiology Studies: Ct Abdomen Pelvis W Contrast  Result Date: 01/27/2017 CLINICAL DATA:  Subjective fevers, decreased appetite generalized weakness x3 days. EXAM: CT ABDOMEN AND PELVIS WITH CONTRAST TECHNIQUE: Multidetector CT imaging of the abdomen and pelvis was performed using the standard protocol following bolus administration of intravenous contrast. CONTRAST:  191m ISOVUE-300 IOPAMIDOL (ISOVUE-300) INJECTION 61% COMPARISON:  01/21/2017 chest CT, 07/14/2006 CT abdomen and pelvis report FINDINGS: Lower chest: Small bilateral pleural effusions slightly increased from recent comparison chest CT. Subpleural areas of fibrosis with peripheral ground-glass opacities and emphysematous change of the lung bases are again noted. Hepatobiliary: No focal liver abnormality is seen. No gallstones, gallbladder wall thickening, or biliary dilatation. Pancreas: Mild pancreatic atrophy without ductal dilatation or mass. No peripancreatic inflammation. Spleen: Normal in size without focal abnormality. Adrenals/Urinary Tract: Normal bilateral adrenal glands. No renal mass nor nephrolithiasis. No obstructive uropathy. Nonspecific mild perinephric fat stranding of both kidneys. The urinary bladder is nondistended  minimal wall thickening anteriorly possibly due to underdistention. Stomach/Bowel: Contracted stomach with normal small bowel rotation. No small bowel dilatation or inflammation. Moderate colonic stool burden is seen. Appendectomy per report. There is descending sigmoid diverticulosis without  acute diverticulitis. Vascular/Lymphatic: Aortic and branch vessel atherosclerosis. No aneurysm or dissection. Atherosclerotic calcifications at the origins of the celiac axis, SMA and both renal arteries. No enlarged abdominal or pelvic lymph nodes. Reproductive: Multifocal areas of hypodensity within an enlarged prostate. The prostate measures up to 6.4 cm transverse by 4.3 cm AP. Findings are concerning for changes of prostatitis and potentially involving prostatic abscess sees the largest is within the right lobe measuring 2.6 cm in diameter with smaller foci in left lobe. Other: Mild diffuse soft tissue edema. Musculoskeletal: Degenerative disc disease L2-3, L4-5 and L5-S1. No findings of osteoblastic or osteolytic disease. No acute osseous abnormality. IMPRESSION: 1. Enlarged appearing prostate with multifocal areas of hypodensity within, the largest measuring 2.6 cm on the right. Changes of prostatitis with prostate abscess is of concern. 2. Interval slight increase bilateral pleural effusions with chronic subpleural areas of fibrosis and ground-glass opacity. 3. Aortic and branch vessel atherosclerosis. 4. Degenerative disc disease of the lumbar spine. Electronically Signed   By: Ashley Royalty M.D.   On: 01/27/2017 22:59        Scheduled Meds: . ALPRAZolam  0.25 mg Oral QHS  . amiodarone  200 mg Oral BID  . aspirin  81 mg Oral Daily  . atorvastatin  40 mg Oral Daily  . cefTRIAXone (ROCEPHIN)  IV  2 g Intravenous Q24H  . escitalopram  10 mg Oral QHS  . furosemide  20 mg Oral Daily  . levETIRAcetam  500 mg Oral BID  . magnesium oxide  200 mg Oral Daily  . mouth rinse  15 mL Mouth Rinse BID  . metoprolol succinate  12.5 mg Oral Daily  . potassium chloride  40 mEq Oral Once   Continuous Infusions: . sodium chloride 75 mL/hr at 01/28/17 0430     LOS: 7 days    Time spent: 40 minutes    Rett Stehlik, MD [facility] Pager [phone]  If 7PM-7AM, please contact  night-coverage [email or web address] Password [facility] 01/29/2017, 7:58 AM

## 2017-01-29 NOTE — Progress Notes (Addendum)
VASCULAR LAB PRELIMINARY  PRELIMINARY  PRELIMINARY  PRELIMINARY  Left lower extremity venous duplex completed.    Preliminary report:  Left :  DVT noted in the brachial , axillary, and distal to mid subclavian veins. Also noted is a superficial thrombus of both the basilic and cephalic veins. Extreme interstitial fluid noted in the left upper arm  Alex [name], RVS 01/29/2017, 3:35 PM

## 2017-01-29 NOTE — H&P (View-Only) (Signed)
Urology Consult  CC: Referring physician: Ramiro Harvest, MD Reason for referral: Prostatic abscess  Impression/Assessment: Prostatic abscess: It would appear that his continued spiking fevers, dysuria, urgency and urge incontinence are most likely secondary to a prostatic abscess.  He has changes on his CT scan in the right lobe of his prostate that would suggest this and his physical exam has confirmed fluctuance in the right lobe of his prostate with exquisite tenderness to palpation of this area. I have therefore discussed with him the need for transurethral unroofing of his prostatic abscess.  I have discussed with him how this procedure would be performed cystoscopically, the risks and complications, the probability of success and the anticipated postoperative course.  I called his wife and discussed the planned surgery with her as well.  They both seemed to understand and have elected to proceed.   Plan: He is scheduled for transurethral unroofing of his prostatic abscess tomorrow morning at 10 AM.    History of Present Illness: Alex [name] is an 83 year old male who I was asked to see in hospital consultation today regarding possible prostatic abscess.  He was admitted on 01/21/17 after having difficulty with intermittent confusion and subjective fever at home.  He was found to be febrile with a temperature 101.8 and hypotensive consistent with sepsis either of pulmonary or GU origin.  His urinalysis appeared infected and a urine culture was performed and grew both Klebsiella and E. coli.  He was placed on vancomycin and Zosyn which was eventually noted to Rocephin however he continued to have spiking fever to greater than 101.  A CT scan was obtained and area of increased density in the right lobe of his prostate suspicious for possible prostatic abscess. He reports that he has been experiencing urinary frequency as well as urgency with urge incontinence for approximately 1 month period of time.   He also has been having intermittent mild dysuria.  He has no prior history of UTIs or prostatitis.  He also reports he has never experienced any gross hematuria.  He denies any testicular pain.  He has been having what sounds like urgency with urge incontinence for the past month as well.  Past Medical History:  Diagnosis Date  . Aortic stenosis    s/p AVR by Dr Laneta Simmers  . COPD (chronic obstructive pulmonary disease) (HCC)   . History of coronary artery disease    status post stenting of the marginal circumflex in 12/2003 and again in 2009  . Hyperlipidemia   . Morbid obesity (HCC)    weight 243 pounds, BMI 31.2kg/m2, BSA 2.36 square meters  . Obstructive sleep apnea    compliant with CPAP  . Persistent atrial fibrillation [facility])    Past Surgical History:  Procedure Laterality Date  . AORTIC VALVE REPLACEMENT (AVR)/CORONARY ARTERY BYPASS GRAFTING (CABG)   08/05/2011   LIMA to LAD, sequential saphenous vein graft to third and fourth obtuse marginal branches of the circumflex, aortic valve replacement using a 23 mm Edwards pericardial valve  . APPENDECTOMY    . CARDIOVERSION N/A 11/14/2014   Procedure: CARDIOVERSION;  Surgeon: Donato Schultz, MD;  Location: [facility] ENDOSCOPY;  Service: Cardiovascular;  Laterality: N/A;  . CARDIOVERSION N/A 11/20/2016   Procedure: CARDIOVERSION;  Surgeon: Pricilla Riffle, MD;  Location: [facility] ENDOSCOPY;  Service: Cardiovascular;  Laterality: N/A;  . CAROTID ENDARTERECTOMY     Dr Lollie Sails  . CRANIOTOMY Right 12/16/2016   Procedure: CRANIOTOMY HEMATOMA EVACUATION SUBDURAL;  Surgeon: Loura Halt Ditty, MD;  Location:  [facility] OR;  Service: Neurosurgery;  Laterality: Right;  . EP IMPLANTABLE DEVICE N/A 11/06/2015   Procedure: PPM Generator Changeout;for sick sinus syndrome with a MDT Adapta L PPM, chronically elevated RV threshold.  . permanent pacemaker     MDT EnRhythm implanted by Dr Lawanda Cousins in [place] for complete heart block with syncope  . REPLACEMENT TOTAL KNEE  BILATERAL     2006  . TEE WITHOUT CARDIOVERSION N/A 11/14/2014   Procedure: TRANSESOPHAGEAL ECHOCARDIOGRAM (TEE);  Surgeon: Donato Schultz, MD;  Location: [facility] ENDOSCOPY;  Service: Cardiovascular;  Laterality: N/A;    Medications:  Scheduled: . ALPRAZolam  0.25 mg Oral QHS  . amiodarone  200 mg Oral BID  . aspirin  81 mg Oral Daily  . atorvastatin  40 mg Oral Daily  . cefTRIAXone (ROCEPHIN)  IV  2 g Intravenous Q24H  . escitalopram  10 mg Oral QHS  . furosemide  20 mg Oral Daily  . levETIRAcetam  500 mg Oral BID  . magnesium oxide  200 mg Oral Daily  . mouth rinse  15 mL Mouth Rinse BID  . metoprolol succinate  12.5 mg Oral Daily   Continuous: . sodium chloride 75 mL/hr at 01/28/17 0430    Allergies: No Known Allergies  Family History  Problem Relation Age of Onset  . Alzheimer's disease Mother   . Tuberculosis Father   . Other Father     Spinal Meningitis    Social History:  reports that he quit smoking about 13 years ago. His smoking use included Cigarettes. He started smoking about 43 years ago. He has a 15.00 pack-year smoking history. He has never used smokeless tobacco. He reports that he drinks alcohol. He reports that he does not use drugs.  Review of Systems (10 point): Pertinent items are noted in HPI. A comprehensive review of systems was negative except as noted above.  Physical Exam:  Vital signs in last 24 hours: Temp:  [98.2 F (36.8 C)-99.9 F (37.7 C)] 98.4 F (36.9 C) (04/04 1311) Pulse Rate:  [33-90] 33 (04/04 1311) Resp:  [16-18] 17 (04/04 1311) BP: (95-138)/(53-69) 97/60 (04/04 1311) SpO2:  [90 %-100 %] 92 % (04/04 1311) Weight:  [230 lb 11.2 oz (104.6 kg)] 230 lb 11.2 oz (104.6 kg) (04/04 0431) General appearance: alert and appears stated age Head: Normocephalic, without obvious abnormality, atraumatic Eyes: conjunctivae/corneas clear. EOM's intact.  Oropharynx: moist mucous membranes Neck: supple, symmetrical, trachea midline Resp: normal  respiratory effort Cardio: regular rate and rhythm Back: symmetric, no curvature. ROM normal. No CVA tenderness. GI: soft, non-tender; bowel sounds normal; no masses,  no organomegaly  Male genitalia: penis: normal male phallus with no lesions or discharge.Testes: bilaterally descended with no masses or tenderness. Rectal: Normal sphincter tone, his prostate is 1-2+ enlarged and smooth.  There is fluctuance noted in the right lobe which is tender to palpation.  Extremities: extremities normal, atraumatic, no cyanosis. Skin: Skin color normal. No visible rashes or lesions Neurologic: Grossly normal with overall decreased mobility  Laboratory Data:   Recent Labs  01/26/17 0620 01/28/17 0306  WBC 8.5 8.5  HGB 10.1* 10.4*  HCT 30.4* 31.8*   BMET  Recent Labs  01/26/17 0620 01/28/17 0306  NA 136 140  K 4.0 3.5  CL 103 103  CO2 24 29  GLUCOSE 89 88  BUN 14 14  CREATININE 0.70 0.70  CALCIUM 7.6* 7.7*   No results for input(s): LABPT, INR in the last 72 hours. No results for input(s): LABURIN  in the last 72 hours. Results for orders placed or performed during the hospital encounter of 01/21/17  Culture, blood (Routine x 2)     Status: None   Collection Time: 01/21/17  8:10 PM  Result Value Ref Range Status   Specimen Description BLOOD LEFT HAND  Final   Special Requests IN PEDIATRIC BOTTLE 4CC  Final   Culture NO GROWTH 5 DAYS  Final   Report Status 01/26/2017 FINAL  Final  Culture, blood (Routine x 2)     Status: Abnormal   Collection Time: 01/21/17  8:20 PM  Result Value Ref Range Status   Specimen Description BLOOD RIGHT ANTECUBITAL  Final   Special Requests BOTTLES DRAWN AEROBIC AND ANAEROBIC 5CC  Final   Culture  Setup Time   Final    GRAM NEGATIVE RODS AEROBIC BOTTLE ONLY CRITICAL RESULT CALLED TO, READ BACK BY AND VERIFIED WITH: E. MARTIN PHARMD, AT 1415 01/22/17 BY D. VANHOOK    Culture KLEBSIELLA PNEUMONIAE (A)  Final   Report Status 01/24/2017 FINAL  Final    Organism ID, Bacteria KLEBSIELLA PNEUMONIAE  Final      Susceptibility   Klebsiella pneumoniae - MIC*    AMPICILLIN >=32 RESISTANT Resistant     CEFAZOLIN <=4 SENSITIVE Sensitive     CEFEPIME <=1 SENSITIVE Sensitive     CEFTAZIDIME <=1 SENSITIVE Sensitive     CEFTRIAXONE <=1 SENSITIVE Sensitive     CIPROFLOXACIN 1 SENSITIVE Sensitive     GENTAMICIN <=1 SENSITIVE Sensitive     IMIPENEM <=0.25 SENSITIVE Sensitive     TRIMETH/SULFA 40 SENSITIVE Sensitive     AMPICILLIN/SULBACTAM 16 INTERMEDIATE Intermediate     PIP/TAZO 8 SENSITIVE Sensitive     Extended ESBL NEGATIVE Sensitive     * KLEBSIELLA PNEUMONIAE  Blood Culture ID Panel (Reflexed)     Status: Abnormal   Collection Time: 01/21/17  8:20 PM  Result Value Ref Range Status   Enterococcus species NOT DETECTED NOT DETECTED Final   Listeria monocytogenes NOT DETECTED NOT DETECTED Final   Staphylococcus species NOT DETECTED NOT DETECTED Final   Staphylococcus aureus NOT DETECTED NOT DETECTED Final   Streptococcus species NOT DETECTED NOT DETECTED Final   Streptococcus agalactiae NOT DETECTED NOT DETECTED Final   Streptococcus pneumoniae NOT DETECTED NOT DETECTED Final   Streptococcus pyogenes NOT DETECTED NOT DETECTED Final   Acinetobacter baumannii NOT DETECTED NOT DETECTED Final   Enterobacteriaceae species DETECTED (A) NOT DETECTED Final    Comment: Enterobacteriaceae represent a large family of gram-negative bacteria, not a single organism. CRITICAL RESULT CALLED TO, READ BACK BY AND VERIFIED WITH: E. MARTIN PHARMD, AT 1415 01/22/17 BY D. VANHOOK    Enterobacter cloacae complex NOT DETECTED NOT DETECTED Final   Escherichia coli NOT DETECTED NOT DETECTED Final   Klebsiella oxytoca NOT DETECTED NOT DETECTED Final   Klebsiella pneumoniae DETECTED (A) NOT DETECTED Final    Comment: CRITICAL RESULT CALLED TO, READ BACK BY AND VERIFIED WITH: E. MARTIN PHARMD, AT 1415 01/22/17 BY D. VANHOOK    Proteus species NOT DETECTED NOT  DETECTED Final   Serratia marcescens NOT DETECTED NOT DETECTED Final   Carbapenem resistance NOT DETECTED NOT DETECTED Final   Haemophilus influenzae NOT DETECTED NOT DETECTED Final   Neisseria meningitidis NOT DETECTED NOT DETECTED Final   Pseudomonas aeruginosa NOT DETECTED NOT DETECTED Final   Candida albicans NOT DETECTED NOT DETECTED Final   Candida glabrata NOT DETECTED NOT DETECTED Final   Candida krusei NOT DETECTED NOT DETECTED Final  Candida parapsilosis NOT DETECTED NOT DETECTED Final   Candida tropicalis NOT DETECTED NOT DETECTED Final  Urine culture     Status: Abnormal   Collection Time: 01/21/17  8:31 PM  Result Value Ref Range Status   Specimen Description URINE, CATHETERIZED  Final   Special Requests NONE  Final   Culture (A)  Final    >=100,000 COLONIES/mL ESCHERICHIA COLI >=100,000 COLONIES/mL KLEBSIELLA PNEUMONIAE    Report Status 01/24/2017 FINAL  Final   Organism ID, Bacteria ESCHERICHIA COLI (A)  Final   Organism ID, Bacteria KLEBSIELLA PNEUMONIAE (A)  Final      Susceptibility   Escherichia coli - MIC*    AMPICILLIN <=2 SENSITIVE Sensitive     CEFAZOLIN <=4 SENSITIVE Sensitive     CEFTRIAXONE <=1 SENSITIVE Sensitive     CIPROFLOXACIN >=4 RESISTANT Resistant     GENTAMICIN <=1 SENSITIVE Sensitive     IMIPENEM <=0.25 SENSITIVE Sensitive     NITROFURANTOIN <=16 SENSITIVE Sensitive     TRIMETH/SULFA <=20 SENSITIVE Sensitive     AMPICILLIN/SULBACTAM <=2 SENSITIVE Sensitive     PIP/TAZO <=4 SENSITIVE Sensitive     Extended ESBL NEGATIVE Sensitive     * >=100,000 COLONIES/mL ESCHERICHIA COLI   Klebsiella pneumoniae - MIC*    AMPICILLIN >=32 RESISTANT Resistant     CEFAZOLIN <=4 SENSITIVE Sensitive     CEFTRIAXONE <=1 SENSITIVE Sensitive     CIPROFLOXACIN <=0.25 SENSITIVE Sensitive     GENTAMICIN <=1 SENSITIVE Sensitive     IMIPENEM <=0.25 SENSITIVE Sensitive     NITROFURANTOIN 64 INTERMEDIATE Intermediate     TRIMETH/SULFA <=20 SENSITIVE Sensitive      AMPICILLIN/SULBACTAM 4 SENSITIVE Sensitive     PIP/TAZO <=4 SENSITIVE Sensitive     Extended ESBL NEGATIVE Sensitive     * >=100,000 COLONIES/mL KLEBSIELLA PNEUMONIAE  Respiratory Panel by PCR     Status: None   Collection Time: 01/22/17  2:19 AM  Result Value Ref Range Status   Adenovirus NOT DETECTED NOT DETECTED Final   Coronavirus 229E NOT DETECTED NOT DETECTED Final   Coronavirus HKU1 NOT DETECTED NOT DETECTED Final   Coronavirus NL63 NOT DETECTED NOT DETECTED Final   Coronavirus OC43 NOT DETECTED NOT DETECTED Final   Metapneumovirus NOT DETECTED NOT DETECTED Final   Rhinovirus / Enterovirus NOT DETECTED NOT DETECTED Final   Influenza A NOT DETECTED NOT DETECTED Final   Influenza B NOT DETECTED NOT DETECTED Final   Parainfluenza Virus 1 NOT DETECTED NOT DETECTED Final   Parainfluenza Virus 2 NOT DETECTED NOT DETECTED Final   Parainfluenza Virus 3 NOT DETECTED NOT DETECTED Final   Parainfluenza Virus 4 NOT DETECTED NOT DETECTED Final   Respiratory Syncytial Virus NOT DETECTED NOT DETECTED Final   Bordetella pertussis NOT DETECTED NOT DETECTED Final   Chlamydophila pneumoniae NOT DETECTED NOT DETECTED Final   Mycoplasma pneumoniae NOT DETECTED NOT DETECTED Final  Culture, blood (routine x 2)     Status: None (Preliminary result)   Collection Time: 01/24/17 12:24 PM  Result Value Ref Range Status   Specimen Description BLOOD RIGHT ANTECUBITAL  Final   Special Requests IN PEDIATRIC BOTTLE 3CC  Final   Culture NO GROWTH 4 DAYS  Final   Report Status PENDING  Incomplete  Culture, blood (routine x 2)     Status: None (Preliminary result)   Collection Time: 01/24/17 12:24 PM  Result Value Ref Range Status   Specimen Description BLOOD RIGHT ANTECUBITAL  Final   Special Requests IN PEDIATRIC BOTTLE 2.5CC  Final   Culture NO GROWTH 4 DAYS  Final   Report Status PENDING  Incomplete   Creatinine:  Recent Labs  01/22/17 0243 01/23/17 0303 01/24/17 0523 01/25/17 0309  01/26/17 0620 01/28/17 0306  CREATININE 0.94 0.86 0.74 0.64 0.70 0.70    Imaging: Ct Abdomen Pelvis W Contrast  Result Date: 01/27/2017 CLINICAL DATA:  Subjective fevers, decreased appetite generalized weakness x3 days. EXAM: CT ABDOMEN AND PELVIS WITH CONTRAST TECHNIQUE: Multidetector CT imaging of the abdomen and pelvis was performed using the standard protocol following bolus administration of intravenous contrast. CONTRAST:  ISOVUE-300 IOPAMIDOL (ISOVUE-300) INJECTION 61% COMPARISON:  01/21/2017 chest CT, 07/14/2006 CT abdomen and pelvis report FINDINGS: Lower chest: Small bilateral pleural effusions slightly increased from recent comparison chest CT. Subpleural areas of fibrosis with peripheral ground-glass opacities and emphysematous change of the lung bases are again noted. Hepatobiliary: No focal liver abnormality is seen. No gallstones, gallbladder wall thickening, or biliary dilatation. Pancreas: Mild pancreatic atrophy without ductal dilatation or mass. No peripancreatic inflammation. Spleen: Normal in size without focal abnormality. Adrenals/Urinary Tract: Normal bilateral adrenal glands. No renal mass nor nephrolithiasis. No obstructive uropathy. Nonspecific mild perinephric fat stranding of both kidneys. The urinary bladder is nondistended minimal wall thickening anteriorly possibly due to underdistention. Stomach/Bowel: Contracted stomach with normal small bowel rotation. No small bowel dilatation or inflammation. Moderate colonic stool burden is seen. Appendectomy per report. There is descending sigmoid diverticulosis without acute diverticulitis. Vascular/Lymphatic: Aortic and branch vessel atherosclerosis. No aneurysm or dissection. Atherosclerotic calcifications at the origins of the celiac axis, SMA and both renal arteries. No enlarged abdominal or pelvic lymph nodes. Reproductive: Multifocal areas of hypodensity within an enlarged prostate. The prostate measures up to 6.4 cm  transverse by 4.3 cm AP. Findings are concerning for changes of prostatitis and potentially involving prostatic abscess sees the largest is within the right lobe measuring 2.6 cm in diameter with smaller foci in left lobe. Other: Mild diffuse soft tissue edema. Musculoskeletal: Degenerative disc disease L2-3, L4-5 and L5-S1. No findings of osteoblastic or osteolytic disease. No acute osseous abnormality. IMPRESSION: 1. Enlarged appearing prostate with multifocal areas of hypodensity within, the largest measuring 2.6 cm on the right. Changes of prostatitis with prostate abscess is of concern. 2. Interval slight increase bilateral pleural effusions with chronic subpleural areas of fibrosis and ground-glass opacity. 3. Aortic and branch vessel atherosclerosis. 4. Degenerative disc disease of the lumbar spine. Electronically Signed   By: Tollie Eth M.D.   On: 01/27/2017 22:59    CT scan images were independently reviewed as noted above.   Garnett Farm 01/28/2017, 7:58 PM

## 2017-01-29 NOTE — Progress Notes (Signed)
LCSW following for disposition:  SNF  Patient has been accepted per [name] at [facility] to SNF pending bed availablity once patient is medically stable. Patient has since transferred to [facility]. LCSW notified CSW [name] department with regards to updated plans for patient.  If returns to [facility], will pick patient back up, otherwise [facility] CSW will follow up.  Plan:SNF at [place].  Alex [name], MSW Clinical Social Work: Optician, dispensing Coverage for :  [phone]

## 2017-01-30 DIAGNOSIS — I82622 Acute embolism and thrombosis of deep veins of left upper extremity: Secondary | ICD-10-CM

## 2017-01-30 DIAGNOSIS — M7989 Other specified soft tissue disorders: Secondary | ICD-10-CM

## 2017-01-30 DIAGNOSIS — I6200 Nontraumatic subdural hemorrhage, unspecified: Secondary | ICD-10-CM

## 2017-01-30 DIAGNOSIS — Z8679 Personal history of other diseases of the circulatory system: Secondary | ICD-10-CM

## 2017-01-30 LAB — CBC WITH DIFFERENTIAL/PLATELET
BASOS PCT: 1 %
Basophils Absolute: 0.1 10*3/uL (ref 0.0–0.1)
Eosinophils Absolute: 0 10*3/uL (ref 0.0–0.7)
Eosinophils Relative: 0 %
HEMATOCRIT: 29 % — AB (ref 39.0–52.0)
HEMOGLOBIN: 9.2 g/dL — AB (ref 13.0–17.0)
LYMPHS PCT: 20 %
Lymphs Abs: 2 10*3/uL (ref 0.7–4.0)
MCH: 30.2 pg (ref 26.0–34.0)
MCHC: 31.7 g/dL (ref 30.0–36.0)
MCV: 95.1 fL (ref 78.0–100.0)
MONO ABS: 0.8 10*3/uL (ref 0.1–1.0)
MONOS PCT: 8 %
NEUTROS ABS: 6.7 10*3/uL (ref 1.7–7.7)
NEUTROS PCT: 71 %
Platelets: 253 10*3/uL (ref 150–400)
RBC: 3.05 MIL/uL — ABNORMAL LOW (ref 4.22–5.81)
RDW: 15.7 % — AB (ref 11.5–15.5)
WBC: 9.6 10*3/uL (ref 4.0–10.5)

## 2017-01-30 LAB — BASIC METABOLIC PANEL
ANION GAP: 4 — AB (ref 5–15)
BUN: 14 mg/dL (ref 6–20)
CHLORIDE: 108 mmol/L (ref 101–111)
CO2: 28 mmol/L (ref 22–32)
Calcium: 7.4 mg/dL — ABNORMAL LOW (ref 8.9–10.3)
Creatinine, Ser: 0.71 mg/dL (ref 0.61–1.24)
GFR calc non Af Amer: >60 mL/min (ref >60)
GLUCOSE: 84 mg/dL (ref 65–99)
POTASSIUM: 4 mmol/L (ref 3.5–5.1)
Sodium: 140 mmol/L (ref 135–145)

## 2017-01-30 MED ORDER — HYDROCODONE-ACETAMINOPHEN 5-325 MG PO TABS
1.0000 | ORAL_TABLET | Freq: Four times a day (QID) | ORAL | Status: DC | PRN
  Administered 2017-01-31 – 2017-02-01 (×3): 1 via ORAL
  Filled 2017-01-30 (×3): qty 1

## 2017-01-30 MED ORDER — SULFAMETHOXAZOLE-TRIMETHOPRIM 800-160 MG PO TABS
1.0000 | ORAL_TABLET | Freq: Two times a day (BID) | ORAL | Status: DC
  Administered 2017-01-30 – 2017-01-31 (×4): 1 via ORAL
  Filled 2017-01-30 (×4): qty 1

## 2017-01-30 NOTE — Progress Notes (Signed)
Patient ID: Alex [name], male   DOB: 08-23-1931, 83 y.o.   MRN: [id number]          [facility]  Date of Admission:  01/21/2017   Total days of antibiotics 10         Principal Problem:   Prostate abscess Active Problems:   Bacteremia due to Klebsiella pneumoniae   3-vessel coronary artery disease   Hyperlipidemia, mixed   Persistent atrial fibrillation (HCC)   Atrial fibrillation (HCC)   Sepsis (HCC)   Transient hypotension   HCAP (healthcare-associated pneumonia)   Left arm swelling   FUO (fever of unknown origin)   Fever   BPH with urinary obstruction: Per urology 01/29/2017   . ALPRAZolam  0.25 mg Oral QHS  . amiodarone  200 mg Oral BID  . aspirin  81 mg Oral Daily  . atorvastatin  40 mg Oral Daily  . cefTRIAXone (ROCEPHIN)  IV  2 g Intravenous Q24H  . escitalopram  10 mg Oral QHS  . furosemide  20 mg Oral Daily  . levETIRAcetam  500 mg Oral BID  . magnesium oxide  200 mg Oral Daily  . mouth rinse  15 mL Mouth Rinse BID  . metoprolol succinate  12.5 mg Oral Daily    SUBJECTIVE: He is feeling better. He is asking when he can leave the hospital.  Review of Systems: Review of Systems  Constitutional: Negative for chills, diaphoresis and fever.  Gastrointestinal: Negative for abdominal pain, diarrhea, nausea and vomiting.  Genitourinary: Negative for dysuria.    Past Medical History:  Diagnosis Date  . Aortic stenosis    s/p AVR by Dr Laneta Simmers  . COPD (chronic obstructive pulmonary disease) (HCC)   . History of coronary artery disease    status post stenting of the marginal circumflex in 12/2003 and again in 2009  . Hyperlipidemia   . Morbid obesity (HCC)    weight 243 pounds, BMI 31.2kg/m2, BSA 2.36 square meters  . Obstructive sleep apnea    compliant with CPAP  . Persistent atrial fibrillation [facility])     Social History  Substance Use Topics  . Smoking status: Former Smoker    Packs/day: 0.50    Years: 30.00    Types:  Cigarettes    Start date: 10/27/1973    Quit date: 10/28/2003  . Smokeless tobacco: Never Used  . Alcohol use 0.0 oz/week     Comment: 8 oz of wine per night    Family History  Problem Relation Age of Onset  . Alzheimer's disease Mother   . Tuberculosis Father   . Other Father     Spinal Meningitis   No Known Allergies  OBJECTIVE: Vitals:   01/30/17 0203 01/30/17 0548 01/30/17 0600 01/30/17 0929  BP: (!) 100/54 100/63  (!) 86/53  Pulse: (!) 104 73  92  Resp: (!) 22 20    Temp: 97.5 F (36.4 C) 98.1 F (36.7 C)  97.9 F (36.6 C)  TempSrc: Axillary Oral  Axillary  SpO2: 94% 93%  93%  Weight:   231 lb 7.7 oz (105 kg)   Height:       Body mass index is 29.72 kg/m.  Physical Exam  Constitutional: He is oriented to person, place, and time.  He is resting quietly in bed visiting with his wife and daughter.  Abdominal: Soft. There is no tenderness.  Neurological: He is alert and oriented to person, place, and time.  Psychiatric: Mood and affect normal.  Lab Results Lab Results  Component Value Date   WBC 9.6 01/30/2017   HGB 9.2 (L) 01/30/2017   HCT 29.0 (L) 01/30/2017   MCV 95.1 01/30/2017   PLT 253 01/30/2017    Lab Results  Component Value Date   CREATININE 0.71 01/30/2017   BUN 14 01/30/2017   NA 140 01/30/2017   K 4.0 01/30/2017   CL 108 01/30/2017   CO2 28 01/30/2017    Lab Results  Component Value Date   ALT 87 (H) 01/22/2017   AST 77 (H) 01/22/2017   ALKPHOS 94 01/22/2017   BILITOT 0.8 01/22/2017     Microbiology: Recent Results (from the past 240 hour(s))  Culture, blood (Routine x 2)     Status: None   Collection Time: 01/21/17  8:10 PM  Result Value Ref Range Status   Specimen Description BLOOD LEFT HAND  Final   Special Requests IN PEDIATRIC BOTTLE 4CC  Final   Culture NO GROWTH 5 DAYS  Final   Report Status 01/26/2017 FINAL  Final  Culture, blood (Routine x 2)     Status: Abnormal   Collection Time: 01/21/17  8:20 PM  Result Value Ref  Range Status   Specimen Description BLOOD RIGHT ANTECUBITAL  Final   Special Requests BOTTLES DRAWN AEROBIC AND ANAEROBIC 5CC  Final   Culture  Setup Time   Final    GRAM NEGATIVE RODS AEROBIC BOTTLE ONLY CRITICAL RESULT CALLED TO, READ BACK BY AND VERIFIED WITH: E. MARTIN PHARMD, AT 1415 01/22/17 BY D. VANHOOK    Culture KLEBSIELLA PNEUMONIAE (A)  Final   Report Status 01/24/2017 FINAL  Final   Organism ID, Bacteria KLEBSIELLA PNEUMONIAE  Final      Susceptibility   Klebsiella pneumoniae - MIC*    AMPICILLIN >=32 RESISTANT Resistant     CEFAZOLIN <=4 SENSITIVE Sensitive     CEFEPIME <=1 SENSITIVE Sensitive     CEFTAZIDIME <=1 SENSITIVE Sensitive     CEFTRIAXONE <=1 SENSITIVE Sensitive     CIPROFLOXACIN 1 SENSITIVE Sensitive     GENTAMICIN <=1 SENSITIVE Sensitive     IMIPENEM <=0.25 SENSITIVE Sensitive     TRIMETH/SULFA 40 SENSITIVE Sensitive     AMPICILLIN/SULBACTAM 16 INTERMEDIATE Intermediate     PIP/TAZO 8 SENSITIVE Sensitive     Extended ESBL NEGATIVE Sensitive     * KLEBSIELLA PNEUMONIAE  Blood Culture ID Panel (Reflexed)     Status: Abnormal   Collection Time: 01/21/17  8:20 PM  Result Value Ref Range Status   Enterococcus species NOT DETECTED NOT DETECTED Final   Listeria monocytogenes NOT DETECTED NOT DETECTED Final   Staphylococcus species NOT DETECTED NOT DETECTED Final   Staphylococcus aureus NOT DETECTED NOT DETECTED Final   Streptococcus species NOT DETECTED NOT DETECTED Final   Streptococcus agalactiae NOT DETECTED NOT DETECTED Final   Streptococcus pneumoniae NOT DETECTED NOT DETECTED Final   Streptococcus pyogenes NOT DETECTED NOT DETECTED Final   Acinetobacter baumannii NOT DETECTED NOT DETECTED Final   Enterobacteriaceae species DETECTED (A) NOT DETECTED Final    Comment: Enterobacteriaceae represent a large family of gram-negative bacteria, not a single organism. CRITICAL RESULT CALLED TO, READ BACK BY AND VERIFIED WITH: E. MARTIN PHARMD, AT 1415 01/22/17 BY  D. VANHOOK    Enterobacter cloacae complex NOT DETECTED NOT DETECTED Final   Escherichia coli NOT DETECTED NOT DETECTED Final   Klebsiella oxytoca NOT DETECTED NOT DETECTED Final   Klebsiella pneumoniae DETECTED (A) NOT DETECTED Final    Comment: CRITICAL RESULT  CALLED TO, READ BACK BY AND VERIFIED WITH: E. MARTIN PHARMD, AT 1415 01/22/17 BY D. VANHOOK    Proteus species NOT DETECTED NOT DETECTED Final   Serratia marcescens NOT DETECTED NOT DETECTED Final   Carbapenem resistance NOT DETECTED NOT DETECTED Final   Haemophilus influenzae NOT DETECTED NOT DETECTED Final   Neisseria meningitidis NOT DETECTED NOT DETECTED Final   Pseudomonas aeruginosa NOT DETECTED NOT DETECTED Final   Candida albicans NOT DETECTED NOT DETECTED Final   Candida glabrata NOT DETECTED NOT DETECTED Final   Candida krusei NOT DETECTED NOT DETECTED Final   Candida parapsilosis NOT DETECTED NOT DETECTED Final   Candida tropicalis NOT DETECTED NOT DETECTED Final  Urine culture     Status: Abnormal   Collection Time: 01/21/17  8:31 PM  Result Value Ref Range Status   Specimen Description URINE, CATHETERIZED  Final   Special Requests NONE  Final   Culture (A)  Final    >=100,000 COLONIES/mL ESCHERICHIA COLI >=100,000 COLONIES/mL KLEBSIELLA PNEUMONIAE    Report Status 01/24/2017 FINAL  Final   Organism ID, Bacteria ESCHERICHIA COLI (A)  Final   Organism ID, Bacteria KLEBSIELLA PNEUMONIAE (A)  Final      Susceptibility   Escherichia coli - MIC*    AMPICILLIN <=2 SENSITIVE Sensitive     CEFAZOLIN <=4 SENSITIVE Sensitive     CEFTRIAXONE <=1 SENSITIVE Sensitive     CIPROFLOXACIN >=4 RESISTANT Resistant     GENTAMICIN <=1 SENSITIVE Sensitive     IMIPENEM <=0.25 SENSITIVE Sensitive     NITROFURANTOIN <=16 SENSITIVE Sensitive     TRIMETH/SULFA <=20 SENSITIVE Sensitive     AMPICILLIN/SULBACTAM <=2 SENSITIVE Sensitive     PIP/TAZO <=4 SENSITIVE Sensitive     Extended ESBL NEGATIVE Sensitive     * >=100,000  COLONIES/mL ESCHERICHIA COLI   Klebsiella pneumoniae - MIC*    AMPICILLIN >=32 RESISTANT Resistant     CEFAZOLIN <=4 SENSITIVE Sensitive     CEFTRIAXONE <=1 SENSITIVE Sensitive     CIPROFLOXACIN <=0.25 SENSITIVE Sensitive     GENTAMICIN <=1 SENSITIVE Sensitive     IMIPENEM <=0.25 SENSITIVE Sensitive     NITROFURANTOIN 64 INTERMEDIATE Intermediate     TRIMETH/SULFA <=20 SENSITIVE Sensitive     AMPICILLIN/SULBACTAM 4 SENSITIVE Sensitive     PIP/TAZO <=4 SENSITIVE Sensitive     Extended ESBL NEGATIVE Sensitive     * >=100,000 COLONIES/mL KLEBSIELLA PNEUMONIAE  Respiratory Panel by PCR     Status: None   Collection Time: 01/22/17  2:19 AM  Result Value Ref Range Status   Adenovirus NOT DETECTED NOT DETECTED Final   Coronavirus 229E NOT DETECTED NOT DETECTED Final   Coronavirus HKU1 NOT DETECTED NOT DETECTED Final   Coronavirus NL63 NOT DETECTED NOT DETECTED Final   Coronavirus OC43 NOT DETECTED NOT DETECTED Final   Metapneumovirus NOT DETECTED NOT DETECTED Final   Rhinovirus / Enterovirus NOT DETECTED NOT DETECTED Final   Influenza A NOT DETECTED NOT DETECTED Final   Influenza B NOT DETECTED NOT DETECTED Final   Parainfluenza Virus 1 NOT DETECTED NOT DETECTED Final   Parainfluenza Virus 2 NOT DETECTED NOT DETECTED Final   Parainfluenza Virus 3 NOT DETECTED NOT DETECTED Final   Parainfluenza Virus 4 NOT DETECTED NOT DETECTED Final   Respiratory Syncytial Virus NOT DETECTED NOT DETECTED Final   Bordetella pertussis NOT DETECTED NOT DETECTED Final   Chlamydophila pneumoniae NOT DETECTED NOT DETECTED Final   Mycoplasma pneumoniae NOT DETECTED NOT DETECTED Final  Culture, blood (routine x 2)  Status: None   Collection Time: 01/24/17 12:24 PM  Result Value Ref Range Status   Specimen Description BLOOD RIGHT ANTECUBITAL  Final   Special Requests IN PEDIATRIC BOTTLE 3CC  Final   Culture NO GROWTH 5 DAYS  Final   Report Status 01/29/2017 FINAL  Final  Culture, blood (routine x 2)      Status: None   Collection Time: 01/24/17 12:24 PM  Result Value Ref Range Status   Specimen Description BLOOD RIGHT ANTECUBITAL  Final   Special Requests IN PEDIATRIC BOTTLE 2.5CC  Final   Culture NO GROWTH 5 DAYS  Final   Report Status 01/29/2017 FINAL  Final     ASSESSMENT: He is improving on therapy for prostate abscess due to Klebsiella and Escherichia coli. I favor completing antibiotic therapy with oral trimethoprim sulfamethoxazole. He has an acute left arm DVT but because of his recent subdural hematoma he is not a candidate to go back on warfarin so we do not have to worry about drug drug interactions with trimethoprim sulfamethoxazole.Marland Kitchen  PLAN: 1. Change ceftriaxone to trimethoprim sulfamethoxazole one double strength tablet twice daily for 3 more weeks 2. I will sign off now  Cliffton Asters, MD [facility] [phone] pager   [phone] cell 01/30/2017, 11:35 AM

## 2017-01-30 NOTE — Progress Notes (Signed)
Pt has only urinated a small amount since this am.  Bladder scanner  showing 150cc's of urine.  Pt states he does not feel the urge to void.  MD aware.  Will continue to monitor closely.

## 2017-01-30 NOTE — Progress Notes (Signed)
OT Cancellation Note  Patient Details Name: Alex [name] MRN: [id number] DOB: 1931-04-09   Cancelled Treatment:    Reason Eval/Treat Not Completed: Patient not medically ready.  Pt is on strict bedrest. Will check back  Mata Rowen 01/30/2017, 11:57 AM  Marica Otter, OTR/L [phone] 01/30/2017

## 2017-01-30 NOTE — Progress Notes (Addendum)
qPhysical Therapy Treatment Patient Details Name: Alex [name] MRN: [id number] DOB: [birth year] Today's Date: 01/30/2017    History of Present Illness Patient is an 83 yo male admitted 01/21/17 with sepsis, pna, UTI, AMS, weakness.    PMH:  SDH 11/29/16 with one day stay on CIR - home on 01/10/17, COPD, Afib, CAD, CABG, OSA on O2 at night, pacemaker, AVR, obesity, HTN, HLD, depression, seizures.  Transfer to [employer] 4/5 plus TURP    PT Comments    Assisted OOB to recliner + 2 assist. Supine     BP 77/48(55), HR 99, 3 lts 86% EOB         BP 92/54(62), HR 98, 3 lts 91% Unable to stand long enough to achieve standing vitals Hoyer pad placed in recliner for nursing to use to assist pt back to bed as recliner is very low. Performed some B LE TE's and incentive spirometer  Believe pt is stronger than he thinks.  Fearful during transfer as he stated, "I wasn't sure you could hold me up".      Follow Up Recommendations  CIR     Equipment Recommendations       Recommendations for Other Services Rehab consult     Precautions / Restrictions Precautions Precautions: Fall Precaution Comments: Monitor oxygen sats Restrictions Weight Bearing Restrictions: No    Mobility  Bed Mobility Overal bed mobility: Needs Assistance Bed Mobility: Supine to Sit     Supine to sit: Mod assist;+2 for physical assistance;HOB elevated     General bed mobility comments: HOB elevated and use of bed pad to complete scooting to EOB.  Once upright, pt required Min Assist for static sitting balance due to Mod posterior L lean  Transfers Overall transfer level: Needs assistance Equipment used: Rolling walker (2 wheeled) Transfers: Sit to/from [employer] Sit to Stand: Max assist;+2 physical assistance;From elevated surface Stand pivot transfers: Max assist;+2 physical assistance       General transfer comment: elevated bed pivot 1/4 turn to recliner.  Pt fearful and did not put forth  full effort.  Partial completed turn.    Ambulation/Gait             General Gait Details: unable to attempt due to poor transfer ability.   Stairs            Wheelchair Mobility    Modified Rankin (Stroke Patients Only)       Balance                                            Cognition Arousal/Alertness: Awake/alert Behavior During Therapy: WFL for tasks assessed/performed Overall Cognitive Status: Within Functional Limits for tasks assessed Area of Impairment: Attention;Problem solving;Following commands;Safety/judgement                 Orientation Level: Disoriented to;Place;Time;Situation Current Attention Level: Sustained Memory: Decreased short-term memory Following Commands: Follows one step commands with increased time Safety/Judgement: Decreased awareness of safety;Decreased awareness of deficits   Problem Solving: Slow processing;Decreased initiation;Difficulty sequencing;Requires verbal cues General Comments: pleasant       Exercises   B LE LAQ's x 10 reps hold 5 sec B LE hip flex (marching) x 10 reps  While sitting in recliner   General Comments        Pertinent Vitals/Pain Pain Assessment: No/denies pain    Home Living  Prior Function            PT Goals (current goals can now be found in the care plan section) Progress towards PT goals: Progressing toward goals    Frequency    Min 3X/week      PT Plan Current plan remains appropriate    Co-evaluation             End of Session Equipment Utilized During Treatment: Gait belt;Oxygen Activity Tolerance: Patient limited by fatigue;Treatment limited secondary to medical complications (Comment) (low BP and decreased O2 sats) Patient left: in chair;with call bell/phone within reach;with chair alarm set Nurse Communication: Need for lift equipment;Mobility status (Hoyer back to bed) PT Visit Diagnosis: Unsteadiness on  feet (R26.81);Other abnormalities of gait and mobility (R26.89);Repeated falls (R29.6);Muscle weakness (generalized) (M62.81)     Time: 4098-1191 PT Time Calculation (min) (ACUTE ONLY): 45 min  Charges:  $Therapeutic Exercise: 8-22 mins $Therapeutic Activity: 23-37 mins                    G Codes:       {Arabela Basaldua  PTA [facility]  Acute  Rehab Pager      [phone]

## 2017-01-30 NOTE — Progress Notes (Signed)
PROGRESS NOTE    Alex [name]  TWS:[id number] DOB: 02-03-1931 DOA: 01/21/2017 PCP: Derrill Center, MD    Brief Narrative:  83 y.o.malewith medical history significant for COPD, A. Fib, CAD s/p CABG, OSA on oxygen at night, s/p PPM, s/p AVR,morbid obesity. He was recently hospitalized in 11/2016 for traumatic subdural hematoma requiring craniotomy. He had prolonged rehabilitation stay for which he was just recently discharged home with 24-hour nursing care 1-1/2weeks ago PTA. Patient has chronic atrial fibrillation and had anunsuccessful trial of cardioversion on 1/25 with Dr. Harrington Challenger. Following the subdural hematoma patient was taken off of Coumadin and has only been on a baby aspirin daily.   Pt presented to [facility] with generalized weakness for past 3 days prior to this admission. He also was more confused especially in the evenings. Pt reported having subjective fevers, decreased appetite.  In ED, pt had fever of 101.9F, pulse rate 38-81, respirations 17-28, blood pressure as low as 80/42, and O2 satsmaintained above 90% on 2 L. Labs revealed normal WBC, hemoglobin 10.7, albumin 1.7, AST 95, ALT 97, lactic acid 1.98, and all other labs relatively unremarkable. Urinalysis showed trace leukocytes. CXR showed bibasilar infiltrates, possible aspiration. No acute findings on CT head. He was started on empiric abx for UTI and pneumonia.  Barrier to discharge: Dr Charlies Silvers spoke with pt daughter over the phone; I have talked to her about the plan of care (obtaining ID consultation, LUE doppler), we also talked about how plan is for continuing to monitor fever curve, not sure why is he still spiking fever. I assured pt daughter that there was no fever yesterday but she said he has fever every single day rectally measured. I also let pt know of my cell# to call me for questions or concerns. She then mentioned to me that I am not working tomorrow "I looked you up on amion". I still offered her to call me as I  am not sure what my schedule will be tomorrow.  ID was consulted and left upper extremity Dopplers obtained due to swelling left upper extremity however pending at this time. CT abdomen and pelvis which was obtained was worrisome for prostatitis and prostatic abscess. Urology was consulted assessed the patient and recommended unroofing of prostatic abscess. Procedure done on 01/29/17, well tolerated. Now afebrile for > 24 hours. Will transitioned antibiotics to PO and follow on disposition.    Assessment & Plan:   Principal Problem:   Prostate abscess Active Problems:   3-vessel coronary artery disease   Hyperlipidemia, mixed   Persistent atrial fibrillation (HCC)   Atrial fibrillation (HCC)   Sepsis (HCC)   Transient hypotension   HCAP (healthcare-associated pneumonia)   Bacteremia due to Klebsiella pneumoniae   Left arm swelling   FUO (fever of unknown origin)   Fever   BPH with urinary obstruction: Per urology 01/29/2017  Sepsis secondary to bibasilar pneumonia, possible aspiration pneumonitis / UTI secondary to E.COli and Klebsiella pneumoniae ([place]) / Klebsiella pneumoniae bacteremia/prostatitis and prostatic abscess. - Sepsis criteria met on admission with fever, tachypnea, tachycardia, hypotension. Source of infection is pneumonia and UTI and prostatitis/prostatic abscess noted on CT abdomen and pelvis 01/27/2017. - Urine cx grew E.Coli and Klebsiella pneumoniae - Blood cx grew Klebsiella pneumoniae - Repeat blood cx 3/31 so far showed no growth  - Resp virus panel negative  - Vanco and zosyn stopped 3/29 and narrowed to rocephin  - Pt subsequently transitioned to oral Bactrim and planning to complete 3 weeks of antibiotics -  Appreciate ID and urology assistance/recommendations and care.  Prostatitis and prostatic abscess -Per CT abdomen and pelvis 01/27/2017.  -Patient also noted to be spiking fevers intermittently.  -Urology was consulted and recommended unroofing of prostatic  abscess  -patient taken to the OR on 01/29/17; tolerated procedure well -no further fevers now -per ID will switch abx's to Bactrim DS BID for 3 weeks -patient to follow up with urology as an outpatient.  Left upper extremity swelling/LUE DVT - Some improvement with left upper extremity swelling. Upper extremity Dopplers demonstrated positive DVT on brachial Vein. -Due to recent subdural hematoma requiring craniotomy February 2018 patient not a candidate for anticoagulation at this time.   Persistent chronic atrial fibrillation (HCC) -CHADS vasc score at least 3 -Currently rate controlled on metoprolol and amiodarone. Not on coumadin due to subdural hematoma; he is on aspirin 81 mg daily, which would be continued.  Chronic diastolic CHF - Compensated overall -plan is to Continue daily lasix and B-blocker; both medications held today due to soft BP    Essential hypertension - Continue metoprolol, dose adjusted and held given soft BP  Dyslipidemia/HLD - Continue Lipitor   Depression  - Continue Lexapro  -no SI or hallucinations   Seizure disorder - Patient with no noted seizures. - Continue Keppra   -Fever Likely secondary to prostatitis and prostatic abscess.  -now afebrile > 24 hours -will continue antibiotics as per ID recommendations    DVT prophylaxis: SCDs Code Status: DO NOT RESUSCITATE Family Communication:  Updated. Spoke to wife at bedside.  Disposition Plan: Final disposition pending, either CIR vs skilled nursing facility once medically stable. Patient to have abx's transitioned to PO and will follow up on tolerance before pursuing discharge. Now afebrile for > 24 hours.   Consultants:   Inpatient rehabilitation Dr.Swartz 01/26/2017  Palliative care: Dr. Domingo Cocking 01/26/2017  Infectious diseases: Dr. Tommy Medal 01/27/2017  Urology: Dr.Ottelin 01/28/2017  Procedures:   CT abdomen and pelvis 01/27/2017  Left upper extremity Dopplers pending.  Chest  x-ray 01/25/2017, 01/21/2017  Antimicrobials:   IV Rocephin 01/22/2017>>>>01/30/17  IV Zosyn 01/21/2017>>>01/22/17  IV vancomycin 01/21/2017>>> 01/22/2017  Bactrim 01/30/17   Subjective: Patient denies any chest pain. No major complaints of shortness of breath, but using Superior supplementation. Patient states feeling better overall. Patient will like to know when he is ready for discharge.  Objective: Vitals:   01/30/17 0548 01/30/17 0600 01/30/17 0929 01/30/17 1207  BP: 100/63  (!) 86/53 (!) 88/51  Pulse: 73  92   Resp: 20     Temp: 98.1 F (36.7 C)  97.9 F (36.6 C)   TempSrc: Oral  Axillary   SpO2: 93%  93%   Weight:  105 kg (231 lb 7.7 oz)    Height:        Intake/Output Summary (Last 24 hours) at 01/30/17 1808 Last data filed at 01/30/17 0700  Gross per 24 hour  Intake            13300 ml  Output            11795 ml  Net             1505 ml   Filed Weights   01/28/17 0431 01/29/17 0456 01/30/17 0600  Weight: 104.6 kg (230 lb 11.2 oz) 105 kg (231 lb 8 oz) 105 kg (231 lb 7.7 oz)    Examination: General exam: Appears calm and comfortable. No fever. Wearing [place] with 2L oxygen supplementation. Respiratory system: Clear to auscultation anterior  lung fields. Respiratory effort normal. No use of accessory muscles. Cardiovascular system: S1 & S2 heard. No JVD, murmurs, rubs, gallops or clicks.  Gastrointestinal system: Abdomen is nondistended, soft and nontender. No organomegaly or masses felt. Normal bowel sounds heard. Central nervous system: Alert and oriented. No focal neurological deficits. Extremities: trace edema bilaterally on his LE; also with LUE swelling and decrease strength, per patient due to swelling. Psychiatry: Judgement and insight appear normal. Mood & affect appropriate.   Data Reviewed: I have personally reviewed following labs and imaging studies  CBC:  Recent Labs Lab 01/25/17 0309 01/26/17 0620 01/28/17 0306 01/29/17 0318 01/30/17 0549  WBC 8.3  8.5 8.5 9.1 9.6  NEUTROABS  --   --   --  6.2 6.7  HGB 10.7* 10.1* 10.4* 10.1* 9.2*  HCT 32.8* 30.4* 31.8* 32.7* 29.0*  MCV 95.6 93.8 96.1 97.3 95.1  PLT 236 233 240 245 974   Basic Metabolic Panel:  Recent Labs Lab 01/25/17 0309 01/26/17 0620 01/28/17 0306 01/29/17 0318 01/30/17 0549  NA 134* 136 140 139 140  K 3.5 4.0 3.5 3.4* 4.0  CL 101 103 103 105 108  CO2 _0 GLUCOSE 92 89 88 86 84  BUN _1 CREATININE 0.64 0.70 0.70 0.63 0.71  CALCIUM 7.7* 7.6* 7.7* 7.5* 7.4*   GFR: Estimated Creatinine Clearance: 88.8 mL/min (by C-G formula based on SCr of 0.71 mg/dL).  Sepsis Labs: No results for input(s): PROCALCITON, LATICACIDVEN in the last 168 hours.  Recent Results (from the past 240 hour(s))  Culture, blood (Routine x 2)     Status: None   Collection Time: 01/21/17  8:10 PM  Result Value Ref Range Status   Specimen Description BLOOD LEFT HAND  Final   Special Requests IN PEDIATRIC BOTTLE 4CC  Final   Culture NO GROWTH 5 DAYS  Final   Report Status 01/26/2017 FINAL  Final  Culture, blood (Routine x 2)     Status: Abnormal   Collection Time: 01/21/17  8:20 PM  Result Value Ref Range Status   Specimen Description BLOOD RIGHT ANTECUBITAL  Final   Special Requests BOTTLES DRAWN AEROBIC AND ANAEROBIC 5CC  Final   Culture  Setup Time   Final    GRAM NEGATIVE RODS AEROBIC BOTTLE ONLY CRITICAL RESULT CALLED TO, READ BACK BY AND VERIFIED WITH: E. MARTIN PHARMD, AT 1415 01/22/17 BY D. VANHOOK    Culture KLEBSIELLA PNEUMONIAE (A)  Final   Report Status 01/24/2017 FINAL  Final   Organism ID, Bacteria KLEBSIELLA PNEUMONIAE  Final      Susceptibility   Klebsiella pneumoniae - MIC*    AMPICILLIN >=32 RESISTANT Resistant     CEFAZOLIN <=4 SENSITIVE Sensitive     CEFEPIME <=1 SENSITIVE Sensitive     CEFTAZIDIME <=1 SENSITIVE Sensitive     CEFTRIAXONE <=1 SENSITIVE Sensitive     CIPROFLOXACIN 1 SENSITIVE Sensitive     GENTAMICIN <=1 SENSITIVE Sensitive      IMIPENEM <=0.25 SENSITIVE Sensitive     TRIMETH/SULFA 40 SENSITIVE Sensitive     AMPICILLIN/SULBACTAM 16 INTERMEDIATE Intermediate     PIP/TAZO 8 SENSITIVE Sensitive     Extended ESBL NEGATIVE Sensitive     * KLEBSIELLA PNEUMONIAE  Blood Culture ID Panel (Reflexed)     Status: Abnormal   Collection Time: 01/21/17  8:20 PM  Result Value Ref Range Status   Enterococcus species NOT DETECTED NOT DETECTED Final   Listeria monocytogenes NOT DETECTED  NOT DETECTED Final   Staphylococcus species NOT DETECTED NOT DETECTED Final   Staphylococcus aureus NOT DETECTED NOT DETECTED Final   Streptococcus species NOT DETECTED NOT DETECTED Final   Streptococcus agalactiae NOT DETECTED NOT DETECTED Final   Streptococcus pneumoniae NOT DETECTED NOT DETECTED Final   Streptococcus pyogenes NOT DETECTED NOT DETECTED Final   Acinetobacter baumannii NOT DETECTED NOT DETECTED Final   Enterobacteriaceae species DETECTED (A) NOT DETECTED Final    Comment: Enterobacteriaceae represent a large family of gram-negative bacteria, not a single organism. CRITICAL RESULT CALLED TO, READ BACK BY AND VERIFIED WITH: E. MARTIN PHARMD, AT 9476 01/22/17 BY D. VANHOOK    Enterobacter cloacae complex NOT DETECTED NOT DETECTED Final   Escherichia coli NOT DETECTED NOT DETECTED Final   Klebsiella oxytoca NOT DETECTED NOT DETECTED Final   Klebsiella pneumoniae DETECTED (A) NOT DETECTED Final    Comment: CRITICAL RESULT CALLED TO, READ BACK BY AND VERIFIED WITH: E. MARTIN PHARMD, AT 1415 01/22/17 BY D. VANHOOK    Proteus species NOT DETECTED NOT DETECTED Final   Serratia marcescens NOT DETECTED NOT DETECTED Final   Carbapenem resistance NOT DETECTED NOT DETECTED Final   Haemophilus influenzae NOT DETECTED NOT DETECTED Final   Neisseria meningitidis NOT DETECTED NOT DETECTED Final   Pseudomonas aeruginosa NOT DETECTED NOT DETECTED Final   Candida albicans NOT DETECTED NOT DETECTED Final   Candida glabrata NOT DETECTED NOT  DETECTED Final   Candida krusei NOT DETECTED NOT DETECTED Final   Candida parapsilosis NOT DETECTED NOT DETECTED Final   Candida tropicalis NOT DETECTED NOT DETECTED Final  Urine culture     Status: Abnormal   Collection Time: 01/21/17  8:31 PM  Result Value Ref Range Status   Specimen Description URINE, CATHETERIZED  Final   Special Requests NONE  Final   Culture (A)  Final    >=100,000 COLONIES/mL ESCHERICHIA COLI >=100,000 COLONIES/mL KLEBSIELLA PNEUMONIAE    Report Status 01/24/2017 FINAL  Final   Organism ID, Bacteria ESCHERICHIA COLI (A)  Final   Organism ID, Bacteria KLEBSIELLA PNEUMONIAE (A)  Final      Susceptibility   Escherichia coli - MIC*    AMPICILLIN <=2 SENSITIVE Sensitive     CEFAZOLIN <=4 SENSITIVE Sensitive     CEFTRIAXONE <=1 SENSITIVE Sensitive     CIPROFLOXACIN >=4 RESISTANT Resistant     GENTAMICIN <=1 SENSITIVE Sensitive     IMIPENEM <=0.25 SENSITIVE Sensitive     NITROFURANTOIN <=16 SENSITIVE Sensitive     TRIMETH/SULFA <=20 SENSITIVE Sensitive     AMPICILLIN/SULBACTAM <=2 SENSITIVE Sensitive     PIP/TAZO <=4 SENSITIVE Sensitive     Extended ESBL NEGATIVE Sensitive     * >=100,000 COLONIES/mL ESCHERICHIA COLI   Klebsiella pneumoniae - MIC*    AMPICILLIN >=32 RESISTANT Resistant     CEFAZOLIN <=4 SENSITIVE Sensitive     CEFTRIAXONE <=1 SENSITIVE Sensitive     CIPROFLOXACIN <=0.25 SENSITIVE Sensitive     GENTAMICIN <=1 SENSITIVE Sensitive     IMIPENEM <=0.25 SENSITIVE Sensitive     NITROFURANTOIN 64 INTERMEDIATE Intermediate     TRIMETH/SULFA <=20 SENSITIVE Sensitive     AMPICILLIN/SULBACTAM 4 SENSITIVE Sensitive     PIP/TAZO <=4 SENSITIVE Sensitive     Extended ESBL NEGATIVE Sensitive     * >=100,000 COLONIES/mL KLEBSIELLA PNEUMONIAE  Respiratory Panel by PCR     Status: None   Collection Time: 01/22/17  2:19 AM  Result Value Ref Range Status   Adenovirus NOT DETECTED NOT DETECTED Final  Coronavirus 229E NOT DETECTED NOT DETECTED Final    Coronavirus HKU1 NOT DETECTED NOT DETECTED Final   Coronavirus NL63 NOT DETECTED NOT DETECTED Final   Coronavirus OC43 NOT DETECTED NOT DETECTED Final   Metapneumovirus NOT DETECTED NOT DETECTED Final   Rhinovirus / Enterovirus NOT DETECTED NOT DETECTED Final   Influenza A NOT DETECTED NOT DETECTED Final   Influenza B NOT DETECTED NOT DETECTED Final   Parainfluenza Virus 1 NOT DETECTED NOT DETECTED Final   Parainfluenza Virus 2 NOT DETECTED NOT DETECTED Final   Parainfluenza Virus 3 NOT DETECTED NOT DETECTED Final   Parainfluenza Virus 4 NOT DETECTED NOT DETECTED Final   Respiratory Syncytial Virus NOT DETECTED NOT DETECTED Final   Bordetella pertussis NOT DETECTED NOT DETECTED Final   Chlamydophila pneumoniae NOT DETECTED NOT DETECTED Final   Mycoplasma pneumoniae NOT DETECTED NOT DETECTED Final  Culture, blood (routine x 2)     Status: None   Collection Time: 01/24/17 12:24 PM  Result Value Ref Range Status   Specimen Description BLOOD RIGHT ANTECUBITAL  Final   Special Requests IN PEDIATRIC BOTTLE 3CC  Final   Culture NO GROWTH 5 DAYS  Final   Report Status 01/29/2017 FINAL  Final  Culture, blood (routine x 2)     Status: None   Collection Time: 01/24/17 12:24 PM  Result Value Ref Range Status   Specimen Description BLOOD RIGHT ANTECUBITAL  Final   Special Requests IN PEDIATRIC BOTTLE 2.5CC  Final   Culture NO GROWTH 5 DAYS  Final   Report Status 01/29/2017 FINAL  Final     Scheduled Meds: . ALPRAZolam  0.25 mg Oral QHS  . amiodarone  200 mg Oral BID  . aspirin  81 mg Oral Daily  . atorvastatin  40 mg Oral Daily  . escitalopram  10 mg Oral QHS  . furosemide  20 mg Oral Daily  . levETIRAcetam  500 mg Oral BID  . magnesium oxide  200 mg Oral Daily  . mouth rinse  15 mL Mouth Rinse BID  . metoprolol succinate  12.5 mg Oral Daily  . sulfamethoxazole-trimethoprim  1 tablet Oral Q12H      LOS: 8 days    Time spent: 25 minutes    Barton Dubois, MD [facility] Pager [phone]  If 7PM-7AM, please contact night-coverage [email or web address] Password [email or web address] 01/30/2017, 6:08 PM

## 2017-01-30 NOTE — Progress Notes (Signed)
qPhysical Therapy Treatment Patient Details Name: Alex [name] MRN: [id number] DOB: [birth year] Today's Date: 01/30/2017    History of Present Illness Patient is an 83 yo male admitted 01/21/17 with sepsis, pna, UTI, AMS, weakness.    PMH:  SDH 11/29/16 with one day stay on CIR - home on 01/10/17, COPD, Afib, CAD, CABG, OSA on O2 at night, pacemaker, AVR, obesity, HTN, HLD, depression, seizures.  Transfer to [employer] 4/5 plus TURP    PT Comments    Called to room by RN to assist pt back to bed.  Used Nurse, adult to transfer from [employer] back to bed with RN.  Increased time for safety and increased time to position pt to comfort once in bed.   Follow Up Recommendations  CIR     Equipment Recommendations       Recommendations for Other Services Rehab consult     Precautions / Restrictions Precautions Precautions: Fall Precaution Comments: Monitor oxygen sats Restrictions Weight Bearing Restrictions: No    Mobility  With RN assisted pt back to bed via Michiel Sites and positioned to comfort in supine  Balance   Cognition Arousal/Alertness: Awake/alert Behavior During Therapy: WFL for tasks assessed/performed Overall Cognitive Status: Within Functional Limits for tasks assessed Area of Impairment: Attention;Problem solving;Following commands;Safety/judgement                 Orientation Level: Disoriented to;Place;Time;Situation Current Attention Level: Sustained Memory: Decreased short-term memory Following Commands: Follows one step commands with increased time Safety/Judgement: Decreased awareness of safety;Decreased awareness of deficits   Problem Solving: Slow processing;Decreased initiation;Difficulty sequencing;Requires verbal cues General Comments: pleasant       Exercises      General Comments        Pertinent Vitals/Pain Pain Assessment: No/denies pain    Home Living                      Prior Function            PT Goals (current goals can  now be found in the care plan section) Progress towards PT goals: Progressing toward goals    Frequency    Min 3X/week      PT Plan Current plan remains appropriate    Co-evaluation             End of Session Equipment Utilized During Treatment: Gait belt;Oxygen Activity Tolerance: Patient limited by fatigue;Treatment limited secondary to medical complications (Comment) (low BP and decreased O2 sats) Patient left: in bed;with call bell/phone within reach;with chair alarm set Nurse Communication: Need for lift equipment;Mobility status (H[place] back to bed) PT Visit Diagnosis: Unsteadiness on feet (R26.81);Other abnormalities of gait and mobility (R26.89);Repeated falls (R29.6);Muscle weakness (generalized) (M62.81)     Time: 4098-1191 PT Time Calculation (min) (ACUTE ONLY): 15 min  Charges:  $Therapeutic Activity: 8-22 mins                    G Codes:       Felecia Shelling  PTA [facility]  Acute  Rehab Pager      [phone]

## 2017-01-30 NOTE — Progress Notes (Signed)
Patient with low blood pressure of 80/56 and 81/55 manual at bedtime. NP on call was notified and order received for a 500cc normal saline bolus over two hours. Repeat blood pressure after bolus 100/54. Will continue to monitor patient.

## 2017-01-30 NOTE — Progress Notes (Signed)
Pt am blood pressure 86/53, HR 92.  Dr. Gwenlyn Perking made aware.  Will follow out any new orders.

## 2017-01-30 NOTE — Progress Notes (Addendum)
Pt unable to do string of bottles due to incontinence.  Large amount of pink colored urine on sheets.  Pt cleaned and changed.  Bladder scanner measuring 120.  Pt states he does not have the urge to void. Condom catheter placed to measure output.  MD made aware.  Will continue to monitor.

## 2017-01-30 NOTE — Progress Notes (Signed)
Waiting to see if pt is approved for CIR by insurance. If not pt can go to SNF.

## 2017-01-30 NOTE — Progress Notes (Signed)
1 Day Post-Op Subjective: The patient is doing well.  No nausea or vomiting. He is not having any pain. His Foley catheter has been removed recently and he has not voided yet.  Objective: Vital signs in last 24 hours: Temp:  [97.5 F (36.4 C)-98.3 F (36.8 C)] 98.1 F (36.7 C) (04/06 0548) Pulse Rate:  [45-108] 73 (04/06 0548) Resp:  [14-25] 20 (04/06 0548) BP: (80-117)/(54-85) 100/63 (04/06 0548) SpO2:  [90 %-100 %] 93 % (04/06 0548) Weight:  [231 lb 7.7 oz (105 kg)] 231 lb 7.7 oz (105 kg) (04/06 0600)  Intake/Output from previous day: 04/05 0701 - 04/06 0700 In: 96045 [P.O.:200; I.V.:1400; IV Piggyback:50] Out: 40981 [Urine:19570; Blood:25] Intake/Output this shift: No intake/output data recorded.  Physical Exam:  General: Alert and oriented. CV: RRR Lungs: Clear bilaterally. GI: Soft, Nondistended. Tube site: Dressing-Dry & intact. Urine: Clear   Lab Results:  Recent Labs  01/28/17 0306 01/29/17 0318 01/30/17 0549  HGB 10.4* 10.1* 9.2*  HCT 31.8* 32.7* 29.0*    Assessment/Plan: POD# 1 s/p TURP/unroofing of prostatic abscess - he is doing well. His catheter has been removed for voiding trial. He was found to have a markedly trabeculated bladder and may have urgency with some urge incontinence postoperatively. I would manage this with a condom catheter for now. He is currently on appropriate antibiotics. Final determination of oral antibiotic and duration will be made by ID. He will follow-up with me in the office as an outpatient.

## 2017-01-30 NOTE — Progress Notes (Signed)
[facility]  Notified by case manager of need for  possible home IV ABX or may DC on PO ABX.  [facility] Infusion Coordinator will follow if  home infusion is needed at DC to support as ordered.  If patient discharges after hours, please call [phone].   Sedalia Muta 01/30/2017, 10:09 AM

## 2017-01-31 DIAGNOSIS — I82622 Acute embolism and thrombosis of deep veins of left upper extremity: Secondary | ICD-10-CM

## 2017-01-31 LAB — CBC
HCT: 31.6 % — ABNORMAL LOW (ref 39.0–52.0)
HEMOGLOBIN: 10 g/dL — AB (ref 13.0–17.0)
MCH: 30.7 pg (ref 26.0–34.0)
MCHC: 31.6 g/dL (ref 30.0–36.0)
MCV: 96.9 fL (ref 78.0–100.0)
PLATELETS: 252 10*3/uL (ref 150–400)
RBC: 3.26 MIL/uL — AB (ref 4.22–5.81)
RDW: 15.8 % — ABNORMAL HIGH (ref 11.5–15.5)
WBC: 8.8 10*3/uL (ref 4.0–10.5)

## 2017-01-31 LAB — BASIC METABOLIC PANEL
ANION GAP: 5 (ref 5–15)
BUN: 15 mg/dL (ref 6–20)
CHLORIDE: 104 mmol/L (ref 101–111)
CO2: 29 mmol/L (ref 22–32)
CREATININE: 0.7 mg/dL (ref 0.61–1.24)
Calcium: 7.7 mg/dL — ABNORMAL LOW (ref 8.9–10.3)
GFR calc non Af Amer: >60 mL/min (ref >60)
Glucose, Bld: 82 mg/dL (ref 65–99)
POTASSIUM: 4.1 mmol/L (ref 3.5–5.1)
SODIUM: 138 mmol/L (ref 135–145)

## 2017-01-31 MED ORDER — SULFAMETHOXAZOLE-TRIMETHOPRIM 800-160 MG PO TABS: 1.0000 | ORAL_TABLET | Freq: Two times a day (BID) | ORAL | Status: DC

## 2017-01-31 NOTE — PMR Pre-admission (Signed)
PMR Admission Coordinator Pre-Admission Assessment  Patient: Alex [name] is an 83 y.o., male MRN: [id number] DOB: [birth year] Height:  (188 cm) Weight: 106.6 kg (235 lb 0.2 oz)             Insurance Information HMO:      PPO:       PCP:       IPA:       80/20:       OTHER:   PRIMARY:  [employer] medicare      Policy#:  [id number]      Subscriber:  Laurence Ferrari CM Name:                                Phone#:                          Fax#:   Pre-Cert#: [id number]      Employer:  Retired Benefits:  Phone #:  [phone]     Name:  Online Eff. Date:  10/27/16     Deduct: $0      Out of Pocket Max: $4400      Life Max: N/A CIR: $345 days 1-5      SNF:  $0 days 1-20; $160 days 21-48; $0 days 49-100 Outpatient:  Medical necessity     Co-Pay: $40/visit Home Health: 100%      Co-Pay: none DME: 80%     Co-Pay: 20% Providers: in network  Emergency Contact Information Contact Information    Name Relation Home Work Harrietta Son [phone]  [phone]   Mikail, [place] [phone]  [phone]   Autumn Messing [phone]  [phone]     Current Medical History  Patient Admitting Diagnosis:  Debility/Sepsis/TURP  History of Present Illness: An 83 yo right handed male with history of morbid obesity, alcohol use, COPD with nighttime oxygen, atrial fibrillation with AVR, pacemaker as well as 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  Was ambulating with minimal assistance using assistive device as well as maintained on a regular diet.  Presented 01/21/17 with decreased appetite, low-grade fever, hypotension and dry cough.  Chest x-ray suspect with bibasilar infiltrates.  Possible aspiration.  CT of the head with no acute abnormality since previous exam.  Urine study negative nitrates.  Lactic acid 1.98, WBC 7.4, BUN 19, creatinine 0.91.  Maintained on sepsis  protocol, broad-spectrum antibiotics.  IV fluids added to maintain blood pressure.  Blood cultures grew Klebsiella pneumoniae, urine culture Escherichia coli as well as Klebsiella.  Cardiac rate remained controlled.  Maintained on low-dose aspirin as prior to admission.  He remains on a regular diet.  Request made for palliative care consult to discuss goals of care.  ID was consulted and left upper extremity dopplers were obtained due to swelling left upper extremity.  CT abdomen and pelvis obtained was worrisome for prostatitis and prostatic Urology was consulted and recommended unroofing of prostatic abscess.  Procedure and TURP were done after transfer to [facility] hospital on 01/29/17.  Is now afebrile and transitioned to PO antibiotics.  PT/OT progress with recommendations for CIR.  Patient is very motivated.     Total: 3=NIH  Past Medical History  Past Medical History:  Diagnosis Date  . Aortic stenosis    s/p AVR by Dr Laneta Simmers  . COPD (chronic obstructive pulmonary  disease) (HCC)   . History of coronary artery disease    status post stenting of the marginal circumflex in 12/2003 and again in 2009  . Hyperlipidemia   . Morbid obesity (HCC)    weight 243 pounds, BMI 31.2kg/m2, BSA 2.36 square meters  . Obstructive sleep apnea    compliant with CPAP  . Persistent atrial fibrillation (HCC)     Family History  family history includes Alzheimer's disease in his mother; Other in his father; Tuberculosis in his father.  Prior Rehab/Hospitalizations: Patient was on CIR and discharged home 01/10/17.  He had HH RN/PT/OT/SLP through [facility].  Has the patient had major surgery during 100 days prior to admission? Yes.  Recently had B SDH with craniotomy and most recently a TURP.  Current Medications   Current Facility-Administered Medications:  .  acetaminophen (TYLENOL) tablet 650 mg, 650 mg, Oral, Q6H PRN, Clydie Braun, MD, 650 mg at 01/26/17 1848 .  ALPRAZolam Prudy Feeler) tablet 0.25 mg, 0.25  mg, Oral, QHS, Clydie Braun, MD, 0.25 mg at 01/30/17 2143 .  amiodarone (PACERONE) tablet 200 mg, 200 mg, Oral, BID, Clydie Braun, MD, 200 mg at 01/31/17 1100 .  aspirin chewable tablet 81 mg, 81 mg, Oral, Daily, Rondell A Katrinka Blazing, MD, 81 mg at 01/31/17 1100 .  atorvastatin (LIPITOR) tablet 40 mg, 40 mg, Oral, Daily, Clydie Braun, MD, 40 mg at 01/31/17 1100 .  escitalopram (LEXAPRO) tablet 10 mg, 10 mg, Oral, QHS, Rondell A Smith, MD, 10 mg at 01/30/17 2143 .  furosemide (LASIX) tablet 20 mg, 20 mg, Oral, Daily, [name], MD, 20 mg at 01/31/17 1100 .  HYDROcodone-acetaminophen (NORCO/VICODIN) 5-325 MG per tablet 1 tablet, 1 tablet, Oral, Q6H PRN, Vassie Loll, MD, 1 tablet at 01/31/17 0456 .  levETIRAcetam (KEPPRA) tablet 500 mg, 500 mg, Oral, BID, Clydie Braun, MD, 500 mg at 01/31/17 1100 .  magnesium oxide (MAG-OX) tablet 200 mg, 200 mg, Oral, Daily, Rondell A Smith, MD, 200 mg at 01/31/17 1100 .  MEDLINE mouth rinse, 15 mL, Mouth Rinse, BID, Rondell A Smith, MD, 15 mL at 01/30/17 1021 .  metoprolol succinate (TOPROL-XL) 24 hr tablet 12.5 mg, 12.5 mg, Oral, Daily, Clydie Braun, MD, Stopped at 01/31/17 1000 .  neomycin-bacitracin-polymyxin (NEOSPORIN) ointment 1 application, 1 application, Topical, TID PRN, Ihor Gully, MD .  ondansetron [facility]) injection 4 mg, 4 mg, Intravenous, Q4H PRN, Ihor Gully, MD .  sulfamethoxazole-trimethoprim (BACTRIM DS,SEPTRA DS) 800-160 MG per tablet 1 tablet, 1 tablet, Oral, Q12H, Cliffton Asters, MD, 1 tablet at 01/31/17 1100  Patients Current Diet: Diet regular Room service appropriate? Yes; Fluid consistency: Thin  Precautions / Restrictions Precautions Precautions: Fall Precaution Comments: Monitor oxygen sats, HR and BP Restrictions Weight Bearing Restrictions: No   Has the patient had 2 or more falls or a fall with injury in the past year?Yes.  He has suffered several falls with injury.  Prior Activity Level Household: Has been  homebound since discharge from CIR on 01/10/17.  Had 24/7 care at home.  Home Assistive Devices / Equipment Home Assistive Devices/Equipment: CPAP, Eyeglasses, Dentures (specify type), Shower chair without back, Environmental consultant (specify type), Wheelchair Home Equipment: Wheelchair - manual, Environmental consultant - 2 wheels  Prior Device Use: Indicate devices/aids used by the patient prior to current illness, exacerbation or injury? Manual wheelchair and Walker  Prior Functional Level Prior Function Level of Independence: Needs assistance Gait / Transfers Assistance Needed: Used RW for gait short distances.  Progressively more  weak requiring assist until unable to stand. ADL's / Homemaking Assistance Needed: Assist with ADL's Comments: Has 24 hour nursing assistance per chart, but reports that he was completing basic ADL independently.   Self Care: Did the patient need help bathing, dressing, using the toilet or eating?  Needed some help  Indoor Mobility: Did the patient need assistance with walking from room to room (with or without device)? Needed some help  Stairs: Did the patient need assistance with internal or external stairs (with or without device)? Needed some help  Functional Cognition: Did the patient need help planning regular tasks such as shopping or remembering to take medications? Needed some help  Current Functional Level Cognition  Overall Cognitive Status: Within Functional Limits for tasks assessed Current Attention Level: Sustained Orientation Level: Oriented to person, Oriented to time Following Commands: Follows one step commands with increased time Safety/Judgement: Decreased awareness of safety, Decreased awareness of deficits General Comments: difficulty describing feeling in LUE; visitor reports he has had decreased coordination    Extremity Assessment (includes Sensation/Coordination)  Upper Extremity Assessment: LUE deficits/detail, Generalized weakness LUE Deficits / Details:  Edema in L UE but per PT this has improved from yesterday. AROM WFL in L UE.  Lower Extremity Assessment: Generalized weakness    ADLs  Overall ADL's : Needs assistance/impaired Eating/Feeding: Set up, Sitting Grooming: Wash/dry face, Sitting, Min guard Grooming Details (indicate cue type and reason): Pt washed face after verbal cues for initiation and min assist for balance seated at EOB Upper Body Bathing: Minimal assistance, Sitting Lower Body Bathing: Sit to/from stand, Maximal assistance Upper Body Dressing : Minimal assistance, Sitting Lower Body Dressing: Maximal assistance, Sit to/from stand Toilet Transfer: Maximal assistance, +2 for safety/equipment, Stand-pivot, RW Toileting- Clothing Manipulation and Hygiene: Maximal assistance, Sit to/from stand Functional mobility during ADLs: Maximal assistance, +2 for physical assistance General ADL Comments: sat EOB x 5 minutes.  His back began to hurt so returned to supine.  Pt was unsteady sitting EOB.  Encouraged use of bil Ues for support.  He tends not to use L, although he has movement. When supine, he was able to fold a washcloth without dropping it, although it was not folded neatly.  Did not have him hold (thick shake); would benefit from lidded mug for regular drink.  BP 88/71 supine and 94/56 sitting up.  No c/o dizziness.      Mobility  Overal bed mobility: Needs Assistance Bed Mobility: Supine to Sit Rolling: Mod assist Supine to sit: Mod assist, +2 for physical assistance, HOB elevated Sit to sidelying: Max assist, +2 for physical assistance General bed mobility comments: max +2 for back to bed    Transfers  Overall transfer level: Needs assistance Equipment used: Rolling walker (2 wheeled) Transfers: Sit to/from Stand, Stand Pivot Transfers Sit to Stand: Max assist, +2 physical assistance, From elevated surface Stand pivot transfers: Max assist, +2 physical assistance General transfer comment: not attempted this session     Ambulation / Gait / Stairs / Wheelchair Mobility  Ambulation/Gait General Gait Details: unable to attempt due to poor transfer ability.    Posture / Balance Dynamic Sitting Balance Sitting balance - Comments: pt wobbly in all directions Balance Overall balance assessment: Needs assistance Sitting-balance support: Bilateral upper extremity supported Sitting balance-[name]: Poor (to fair; min guard for safety) Sitting balance - Comments: pt wobbly in all directions Postural control: Left lateral lean Standing balance support: Bilateral upper extremity supported, During functional activity Standing balance-[name]: Poor Standing balance  comment: Reliant on RW and max A +2 to stand and perform stand pivot transfer.     Special needs/care consideration BiPAP/CPAP Yes, CPAP CPM No Continuous Drip IV No Dialysis No         Life Vest No Oxygen Yes on 02 at 3 1/2 L at this time to keep sats > 92 Special Bed No Trach Size No Wound Vac (area) No       Skin Has healing head incision from craniotomy.  Has very swollen L arm with blisters.                   Bowel mgmt: Last BM 01/28/17, incontinence at times Bladder mgmt: Condom catheter in place Diabetic mgmt No HOH - wife reports that patient has "selective" hearing loss    Previous Home Environment Living Arrangements: Spouse/significant other Available Help at Discharge: Family, Personal care attendant, Available 24 hours/day Type of Home: House Home Layout: One level Home Access: Stairs to enter Entrance Stairs-Rails: Right Entrance Stairs-Number of Steps: 3 Bathroom Shower/Tub: Health visitor: Standard Bathroom Accessibility: Yes How Accessible: Accessible via walker Home Care Services: No Additional Comments: wife can provide supervision level  Discharge Living Setting Plans for Discharge Living Setting: Patient's home, House, Lives with (comment) (Lives with wife.) Type of Home at Discharge:  House Discharge Home Layout: One level Discharge Home Access: Stairs to enter Entrance Stairs-Number of Steps: 3 steps Discharge Bathroom Shower/Tub: Walk-in shower Discharge Bathroom Toilet: Standard Discharge Bathroom Accessibility: Yes How Accessible: Accessible via walker Does the patient have any problems obtaining your medications?: No  Social/Family/Support Systems Patient Roles: Spouse, Parent, Other (Comment) (Has wife, son and good friend "adopted daughter", French Ana.) Contact Information: wife Anticipated Caregiver: wife, hired caregivers Anticipated Industrial/product designer Information: Please see emergency contacts above Ability/Limitations of Caregiver: Wife can provide supervision.  Patient has 24/7 caregivers at home. Caregiver Availability: 24/7 (Has hired help.) Discharge Plan Discussed with Primary Caregiver: Yes Is Caregiver In Agreement with Plan?: Yes Does Caregiver/Family have Issues with Lodging/Transportation while Pt is in Rehab?: No  Goals/Additional Needs Patient/Family Goal for Rehab: PT/OT supervision to min assist, SLP mod I and supervision goals Expected length of stay: 9-13 days Cultural Considerations: [facility] Dietary Needs: Regular diet, thin liquids Equipment Needs: TBD Pt/Family Agrees to Admission and willing to participate: Yes (Spoke with wife and friend, Haynes Bast.) Program Orientation Provided & Reviewed with Pt/Caregiver Including Roles  & Responsibilities: Yes (Familiar with program from stay on CIR recently.)  Decrease burden of Care through IP rehab admission: N/A  Possible need for SNF placement upon discharge: Not anticipated since patient has 24/7 caregiver support at home with hired caregivers.  Patient Condition: This patient's medical and functional status has changed since the consult dated: 01/26/17 in which the Rehabilitation Physician determined and documented that the patient's condition is appropriate for intensive rehabilitative  care in an inpatient rehabilitation facility. See "History of Present Illness" (above) for medical update. Functional changes are:  Currently requiring mod to max assist for mobility . Patient's medical and functional status update has been discussed with the Rehabilitation physician and patient remains appropriate for inpatient rehabilitation. Will admit to inpatient rehab tomorrow.  Preadmission Screen Completed By:  Trish Mage, 01/31/2017 4:17 PM ______________________________________________________________________   Discussed status with Dr. Allena Katz on 01/31/17 at 1611 and received telephone approval for admission tomorrow.  Admission Coordinator:  Trish Mage, time 1611/Date 01/31/17

## 2017-01-31 NOTE — Discharge Summary (Signed)
Physician Discharge Summary  LAWTON DOLLINGER KXF:[id number] DOB: 1931/09/13 DOA: 01/21/2017  PCP: Derrill Center, MD  Admit date: 01/21/2017 Discharge date: 02/01/2017  Time spent: 35 minutes  Recommendations for Outpatient Follow-up:  Repeat BMET to follow electrolytes and renal function  Follow patient volume status closely  Discharge Diagnoses:  Principal Problem:   Prostate abscess Active Problems:   3-vessel coronary artery disease   Hyperlipidemia, mixed   Persistent atrial fibrillation (HCC)   Atrial fibrillation (HCC)   Sepsis ([place])   Transient hypotension   HCAP (healthcare-associated pneumonia)   Bacteremia due to Klebsiella pneumoniae   Left arm swelling   FUO (fever of unknown origin)   Fever   BPH with urinary obstruction: Per urology 01/29/2017   Acute deep vein thrombosis (DVT) of left upper extremity ([place])   Discharge Condition: stable and improved. Discharge to inpatient rehab for further care, conditioning and rehabilitation  Diet recommendation: heart healthy   Filed Weights   01/29/17 0456 01/30/17 0600 01/31/17 0446  Weight: 105 kg (231 lb 8 oz) 105 kg (231 lb 7.7 oz) 106.6 kg (235 lb 0.2 oz)    History of present illness:  83 y.o.malewith medical history significant for COPD, A. Fib, CAD s/p CABG, OSA on oxygen at night, s/p PPM, s/p AVR,morbid obesity. He was recently hospitalized in 11/2016 for traumatic subdural hematoma requiring craniotomy. He had prolonged rehabilitation stay for which he was just recently discharged home with 24-hour nursing care 1-1/2weeks ago PTA. Patient has chronic atrial fibrillation and had anunsuccessful trial of cardioversion on 1/25 with Dr. Harrington Challenger. Following the subdural hematoma patient was taken off of Coumadin and has only been on a baby aspirin daily.   Pt presented to [facility] with generalized weakness for past 3 days prior to this admission. He also was more confused especially in the evenings. Pt reported having subjective  fevers, decreased appetite.  In ED, pt had fever of 101.73F, pulse rate 38-81, respirations 17-28, blood pressure as low as 80/42, and O2 satsmaintained above 90% on 2 L. Labs revealed normal WBC, hemoglobin 10.7, albumin 1.7, AST 95, ALT 97, lactic acid 1.98, and all other labs relatively unremarkable. Urinalysis showed trace leukocytes. CXR showed bibasilar infiltrates, possible aspiration. No acute findings on CT head. He was started on empiric abx for UTI and pneumonia.  Hospital Course:  Sepsis secondary to bibasilar pneumonia, possible aspiration pneumonitis / UTI secondary to E.COli and Klebsiella pneumoniae ([place]) / Klebsiella pneumoniae bacteremia/prostatitis and prostatic abscess. - Sepsis criteria met on admission with fever, tachypnea, tachycardia, hypotension. Source of infection is pneumonia and UTI and prostatitis/prostatic abscess noted on CT abdomen and pelvis 01/27/2017. - Urine cx grew E.Coli and Klebsiella pneumoniae - Blood cx grew Klebsiella pneumoniae - Repeat blood cx 3/31 so far showed no growth  - Resp virus panel negative  - Vanco and zosyn stopped 3/29 andnarrowed to rocephin  - Pt subsequently transitioned to oral Bactrim and planning to complete 3 weeks of antibiotics -Appreciate ID and urology assistance/recommendations and care.  Prostatitis and prostatic abscess -Per CT abdomen and pelvis 01/27/2017.  -Patient also noted to be spiking fevers intermittently.  -Urology was consulted and recommended unroofing of prostatic abscess  -patient taken to the OR on 01/29/17; tolerated procedure well -no further fevers now -per ID will switch abx's to Bactrim DS BID for 3 weeks -patient to follow up with urology as an outpatient.  Left upper extremity swelling/LUE DVT - Some improvement with left upper extremity swelling. Upper extremity Dopplers demonstrated  positive DVT on brachial Vein. -Due to recent subdural hematoma requiring craniotomy February 2018 patient  not a candidate for anticoagulation at this time.   Persistent chronic atrial fibrillation (HCC) -CHADS vasc score at least 3 -Currently rate controlled on metoprolol and amiodarone. Not on coumadin due to subdural hematoma; he is on aspirin 81 mg daily, which would be continued.  Chronic diastolic CHF - Compensated overall -plan is to Continue daily lasix and B-blocker; both medications held today due to soft BP  Essential hypertension - Continue metoprolol, dose adjusted and held given soft BP  Dyslipidemia/HLD - Continue Lipitor   Depression  - Continue Lexapro  -no SI or hallucinations   Seizure disorder - Patient with no noted seizures. - Continue Keppra   -Fever Likely secondary to prostatitis and prostatic abscess.  -now afebrile > 24 hours -will continue antibiotics as per ID recommendations    Procedures:  CT abdomen and pelvis 01/27/2017  Left upper extremity Dopplers pending.  Chest x-ray 01/25/2017, 01/21/2017  TURP & unroofing  Prostatic abscess  Consultations:  Inpatient rehabilitation Dr.Swartz 01/26/2017  Palliative care: Dr. Domingo Cocking 01/26/2017  Infectious diseases: Dr. Tommy Medal 01/27/2017  Urology: Dr.Ottelin 01/28/2017  Discharge Exam: Vitals:   01/31/17 1100 01/31/17 1400  BP: (!) 87/71 (!) 90/57  Pulse: (!) 102 100  Resp:  18  Temp:  97.9 F (36.6 C)   General exam: Appears calm and comfortable. No fever. Wearing [place] with 2L oxygen supplementation. Denies CP. Respiratory system: Clear to auscultation anterior lung fields. Respiratory effort normal. No use of accessory muscles. Cardiovascular system: S1 & S2 heard. No JVD, murmurs, rubs, gallops or clicks.  Gastrointestinal system: Abdomen is nondistended, soft and nontender. No organomegaly or masses felt. Normal bowel sounds heard. Central nervous system: Alert and oriented. No focal neurological deficits. Extremities: trace edema bilaterally on his LE; also with LUE swelling  and decrease strength, per patient due to swelling. Psychiatry: Judgement and insight appear normal. Mood & affect appropriate.    Discharge Instructions   Discharge Instructions    Diet - low sodium heart healthy    Complete by:  As directed    Discharge instructions    Complete by:  As directed    Keep yourself well hydrated  Arrange follow up after discharge in 10 days Repeat BMET to follow electrolytes and renal function  Follow BP and adjust antihypertensive regimen as needed  Close follow up on his volume status Weaned oxygen supplementation as tolerated   Increase activity slowly    Complete by:  As directed      Current Discharge Medication List    START taking these medications   Details  sulfamethoxazole-trimethoprim (BACTRIM DS,SEPTRA DS) 800-160 MG tablet Take 1 tablet by mouth every 12 (twelve) hours.      CONTINUE these medications which have NOT CHANGED   Details  acetaminophen (TYLENOL) 500 MG tablet Take 1,000 mg by mouth every 6 (six) hours as needed.    ALPRAZolam (XANAX) 0.25 MG tablet Take 1 tablet (0.25 mg total) by mouth at bedtime. Qty: 30 tablet, Refills: 0    amiodarone (PACERONE) 200 MG tablet Take 1 tablet (200 mg total) by mouth 2 (two) times daily. Qty: 60 tablet, Refills: 1    aspirin 81 MG tablet Take 81 mg by mouth daily.    atorvastatin (LIPITOR) 40 MG tablet Take 1 tablet (40 mg total) by mouth daily. Qty: 30 tablet, Refills: 1    B Complex Vitamins (VITAMIN B COMPLEX PO) Take  1 tablet by mouth daily.     escitalopram (LEXAPRO) 10 MG tablet Take 1 tablet (10 mg total) by mouth at bedtime. Qty: 30 tablet, Refills: 1    furosemide (LASIX) 20 MG tablet Take 1 tablet ('20mg'$ ) by mouth daily. May take extra tablet for weight gain >3lbs Qty: 45 tablet, Refills: 6    levETIRAcetam (KEPPRA) 500 MG tablet Take 1 tablet (500 mg total) by mouth 2 (two) times daily. Qty: 60 tablet, Refills: 0    Magnesium 250 MG TABS Take 1 tablet (250 mg  total) by mouth daily. Refills: 0    metoprolol succinate (TOPROL-XL) 25 MG 24 hr tablet Take 0.5 tablets (12.5 mg total) by mouth daily. Qty: 30 tablet, Refills: 1    potassium chloride SA (K-DUR,KLOR-CON) 20 MEQ tablet Take 1 tablet (20 mEq total) by mouth daily. Qty: 30 tablet, Refills: 1      STOP taking these medications     naproxen sodium (ANAPROX) 220 MG tablet      cephALEXin (KEFLEX) 250 MG capsule        No Known Allergies Follow-up Information    Call [facility]   Why:  For an appointment in ~2 weeks when you get home. Contact information: [address]  [place] [address] [place] [address] [phone]        Derrill Center, MD. Schedule an appointment as soon as possible for a visit in 10 day(s).   Specialty:  Family Medicine Why:  after discharge from inpatient rehab Contact information: [address] [place] [address] [phone]           The results of significant diagnostics from this hospitalization (including imaging, microbiology, ancillary and laboratory) are listed below for reference.    Significant Diagnostic Studies: Dg Chest 2 View  Result Date: 01/21/2017 CLINICAL DATA:  Generalize weakness for 3 days. EXAM: CHEST  2 VIEW COMPARISON:  12/11/2016 FINDINGS: The pacer wires stable. The cardiac silhouette, mediastinal and hilar contours are unchanged. Moderate tortuosity and calcification of the thoracic aorta. Streaky bibasilar density suspicious for pneumonia. No obvious pleural effusions. IMPRESSION: Suspect bibasilar infiltrates/pneumonia.  Possible aspiration. Electronically Signed   By: Marijo Sanes M.D.   On: 01/21/2017 20:02   Ct Head Wo Contrast  Result Date: 01/21/2017 CLINICAL DATA:  Mental status change EXAM: CT HEAD WITHOUT CONTRAST TECHNIQUE: Contiguous axial images were obtained from the base of the skull through the vertex without intravenous contrast. COMPARISON:  12/17/2016 FINDINGS: Brain: Residual  right cerebral convexity subdural hematoma, smaller than on prior exam measuring up to 6 mm in thickness versus 14 mm previously. Overlying right-sided dural thickening is seen over the right cerebral convexity with post craniotomy change noted of right bony calvarium. No acute intracranial hemorrhage, intra-axial mass nor large vascular territory infarct. Chronic small vessel ischemic disease of periventricular white matter. Decrease in right to left midline shift now approximately 2 mm versus 9 mm previously. Vascular: Moderate bilateral carotid siphon calcifications. Skull: Right parietal craniotomy Sinuses/Orbits: Clear mastoids. Minimal mucosal thickening of ethmoid sinus. The frontal, sphenoid and visualized maxillary sinuses are nonacute. Intact globes bilaterally. Bilateral lens replacements surgeries. Other: Resolution of pneumocephalus and decrease in scalp soft tissue swelling of the right since prior. IMPRESSION: 1. Some residual chronic subdural fluid is seen overlying the right cerebral convexity though decreased since prior, now measuring up to 6 mm in thickness versus 14 mm previously. There has been some interval development of right-sided dural thickening which can be  seen in this setting since prior. 2. Resolution of pneumocephalus. 3. Interval decrease in right to left midline shift since prior. 4. No acute nor new findings since previous exam. Chronic small vessel ischemic disease of periventricular white matter. Electronically Signed   By: Ashley Royalty M.D.   On: 01/21/2017 21:24   Ct Chest Wo Contrast  Result Date: 01/21/2017 CLINICAL DATA:  Pneumonia EXAM: CT CHEST WITHOUT CONTRAST TECHNIQUE: Multidetector CT imaging of the chest was performed following the standard protocol without IV contrast. COMPARISON:  None. FINDINGS: Cardiovascular: Left-sided pacemaker apparatus with right atrial right ventricular leads are noted. Median sternotomy sutures from prior CABG. Aortic atherosclerosis  with coronary arteriosclerosis. No pericardial effusion. Mediastinum/Nodes: No mediastinal or definite hilar lymphadenopathy given limitations of a noncontrast study. No thyromegaly or mass. Small secretions noted in the distal trachea on the right. Esophagus is unremarkable. Lungs/Pleura: Mosaic attenuation of the lungs possibly related to constrictive bronchiolitis. Ill-defined left upper lobe spiculation measuring 2.7 x 1.5 x 0.7 cm. Findings could be a focus of atelectasis, focus of prior pneumonic consolidation and/or scarring. Subpleural densities in the right lower lobe series 4, image 90 and 106 are identified potentially postinfectious or postinflammatory in etiology as well. Follow-up to assure resolution is recommended and to exclude other etiologies including neoplasm. Upper Abdomen: No acute abnormality. Musculoskeletal: No chest wall mass or suspicious bone lesions identified. Degenerative osteophytes noted along the dorsal spine. IMPRESSION: 1. 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 Subpleural right lower lobe and intraparenchymal left upper lobe ill-defined opacities are seen which may reflect more confluent areas of pneumonia, atelectasis or postinflammatory change. As neoplasm cannot be entirely excluded, recommend followup short term to assure improvement or resolution. 2. Aortic atherosclerosis.  Status post CABG. Electronically Signed   By: Ashley Royalty M.D.   On: 01/21/2017 21:34   Ct Abdomen Pelvis W Contrast  Result Date: 01/27/2017 CLINICAL DATA:  Subjective fevers, decreased appetite generalized weakness x3 days. EXAM: CT ABDOMEN AND PELVIS WITH CONTRAST TECHNIQUE: Multidetector CT imaging of the abdomen and pelvis was performed using the standard protocol following bolus administration of intravenous contrast. CONTRAST:  129m ISOVUE-300 IOPAMIDOL (ISOVUE-300) INJECTION 61% COMPARISON:  01/21/2017 chest CT, 07/14/2006 CT abdomen and pelvis  report FINDINGS: Lower chest: Small bilateral pleural effusions slightly increased from recent comparison chest CT. Subpleural areas of fibrosis with peripheral ground-glass opacities and emphysematous change of the lung bases are again noted. Hepatobiliary: No focal liver abnormality is seen. No gallstones, gallbladder wall thickening, or biliary dilatation. Pancreas: Mild pancreatic atrophy without ductal dilatation or mass. No peripancreatic inflammation. Spleen: Normal in size without focal abnormality. Adrenals/Urinary Tract: Normal bilateral adrenal glands. No renal mass nor nephrolithiasis. No obstructive uropathy. Nonspecific mild perinephric fat stranding of both kidneys. The urinary bladder is nondistended minimal wall thickening anteriorly possibly due to underdistention. Stomach/Bowel: Contracted stomach with normal small bowel rotation. No small bowel dilatation or inflammation. Moderate colonic stool burden is seen. Appendectomy per report. There is descending sigmoid diverticulosis without acute diverticulitis. Vascular/Lymphatic: Aortic and branch vessel atherosclerosis. No aneurysm or dissection. Atherosclerotic calcifications at the origins of the celiac axis, SMA and both renal arteries. No enlarged abdominal or pelvic lymph nodes. Reproductive: Multifocal areas of hypodensity within an enlarged prostate. The prostate measures up to 6.4 cm transverse by 4.3 cm AP. Findings are concerning for changes of prostatitis and potentially involving prostatic abscess sees the largest is within the right lobe measuring 2.6 cm in diameter with smaller foci in left  lobe. Other: Mild diffuse soft tissue edema. Musculoskeletal: Degenerative disc disease L2-3, L4-5 and L5-S1. No findings of osteoblastic or osteolytic disease. No acute osseous abnormality. IMPRESSION: 1. Enlarged appearing prostate with multifocal areas of hypodensity within, the largest measuring 2.6 cm on the right. Changes of prostatitis with  prostate abscess is of concern. 2. Interval slight increase bilateral pleural effusions with chronic subpleural areas of fibrosis and ground-glass opacity. 3. Aortic and branch vessel atherosclerosis. 4. Degenerative disc disease of the lumbar spine. Electronically Signed   By: Ashley Royalty M.D.   On: 01/27/2017 22:59   Dg Chest Port 1 View  Result Date: 01/25/2017 CLINICAL DATA:  Fever. EXAM: PORTABLE CHEST 1 VIEW COMPARISON:  Chest CT and chest radiographs dated 01/21/2017. FINDINGS: The cardiac silhouette remains borderline enlarged. Stable median sternotomy wires and left subclavian pacemaker leads. Mild increase in patchy interstitial prominence in the mid and lower lung zones bilaterally. No definite pleural fluid seen. Unremarkable bones. IMPRESSION: Mildly progressive bilateral pneumonitis/pneumonia. Electronically Signed   By: Claudie Revering M.D.   On: 01/25/2017 09:05    Microbiology: Recent Results (from the past 240 hour(s))  Culture, blood (Routine x 2)     Status: None   Collection Time: 01/21/17  8:10 PM  Result Value Ref Range Status   Specimen Description BLOOD LEFT HAND  Final   Special Requests IN PEDIATRIC BOTTLE 4CC  Final   Culture NO GROWTH 5 DAYS  Final   Report Status 01/26/2017 FINAL  Final  Culture, blood (Routine x 2)     Status: Abnormal   Collection Time: 01/21/17  8:20 PM  Result Value Ref Range Status   Specimen Description BLOOD RIGHT ANTECUBITAL  Final   Special Requests BOTTLES DRAWN AEROBIC AND ANAEROBIC 5CC  Final   Culture  Setup Time   Final    GRAM NEGATIVE RODS AEROBIC BOTTLE ONLY CRITICAL RESULT CALLED TO, READ BACK BY AND VERIFIED WITH: E. MARTIN PHARMD, AT 1415 01/22/17 BY D. VANHOOK    Culture KLEBSIELLA PNEUMONIAE (A)  Final   Report Status 01/24/2017 FINAL  Final   Organism ID, Bacteria KLEBSIELLA PNEUMONIAE  Final      Susceptibility   Klebsiella pneumoniae - MIC*    AMPICILLIN >=32 RESISTANT Resistant     CEFAZOLIN <=4 SENSITIVE Sensitive      CEFEPIME <=1 SENSITIVE Sensitive     CEFTAZIDIME <=1 SENSITIVE Sensitive     CEFTRIAXONE <=1 SENSITIVE Sensitive     CIPROFLOXACIN 1 SENSITIVE Sensitive     GENTAMICIN <=1 SENSITIVE Sensitive     IMIPENEM <=0.25 SENSITIVE Sensitive     TRIMETH/SULFA 40 SENSITIVE Sensitive     AMPICILLIN/SULBACTAM 16 INTERMEDIATE Intermediate     PIP/TAZO 8 SENSITIVE Sensitive     Extended ESBL NEGATIVE Sensitive     * KLEBSIELLA PNEUMONIAE  Blood Culture ID Panel (Reflexed)     Status: Abnormal   Collection Time: 01/21/17  8:20 PM  Result Value Ref Range Status   Enterococcus species NOT DETECTED NOT DETECTED Final   Listeria monocytogenes NOT DETECTED NOT DETECTED Final   Staphylococcus species NOT DETECTED NOT DETECTED Final   Staphylococcus aureus NOT DETECTED NOT DETECTED Final   Streptococcus species NOT DETECTED NOT DETECTED Final   Streptococcus agalactiae NOT DETECTED NOT DETECTED Final   Streptococcus pneumoniae NOT DETECTED NOT DETECTED Final   Streptococcus pyogenes NOT DETECTED NOT DETECTED Final   Acinetobacter baumannii NOT DETECTED NOT DETECTED Final   Enterobacteriaceae species DETECTED (A) NOT DETECTED Final  Comment: Enterobacteriaceae represent a large family of gram-negative bacteria, not a single organism. CRITICAL RESULT CALLED TO, READ BACK BY AND VERIFIED WITH: E. MARTIN PHARMD, AT 1540 01/22/17 BY D. VANHOOK    Enterobacter cloacae complex NOT DETECTED NOT DETECTED Final   Escherichia coli NOT DETECTED NOT DETECTED Final   Klebsiella oxytoca NOT DETECTED NOT DETECTED Final   Klebsiella pneumoniae DETECTED (A) NOT DETECTED Final    Comment: CRITICAL RESULT CALLED TO, READ BACK BY AND VERIFIED WITH: E. MARTIN PHARMD, AT 1415 01/22/17 BY D. VANHOOK    Proteus species NOT DETECTED NOT DETECTED Final   Serratia marcescens NOT DETECTED NOT DETECTED Final   Carbapenem resistance NOT DETECTED NOT DETECTED Final   Haemophilus influenzae NOT DETECTED NOT DETECTED Final    Neisseria meningitidis NOT DETECTED NOT DETECTED Final   Pseudomonas aeruginosa NOT DETECTED NOT DETECTED Final   Candida albicans NOT DETECTED NOT DETECTED Final   Candida glabrata NOT DETECTED NOT DETECTED Final   Candida krusei NOT DETECTED NOT DETECTED Final   Candida parapsilosis NOT DETECTED NOT DETECTED Final   Candida tropicalis NOT DETECTED NOT DETECTED Final  Urine culture     Status: Abnormal   Collection Time: 01/21/17  8:31 PM  Result Value Ref Range Status   Specimen Description URINE, CATHETERIZED  Final   Special Requests NONE  Final   Culture (A)  Final    >=100,000 COLONIES/mL ESCHERICHIA COLI >=100,000 COLONIES/mL KLEBSIELLA PNEUMONIAE    Report Status 01/24/2017 FINAL  Final   Organism ID, Bacteria ESCHERICHIA COLI (A)  Final   Organism ID, Bacteria KLEBSIELLA PNEUMONIAE (A)  Final      Susceptibility   Escherichia coli - MIC*    AMPICILLIN <=2 SENSITIVE Sensitive     CEFAZOLIN <=4 SENSITIVE Sensitive     CEFTRIAXONE <=1 SENSITIVE Sensitive     CIPROFLOXACIN >=4 RESISTANT Resistant     GENTAMICIN <=1 SENSITIVE Sensitive     IMIPENEM <=0.25 SENSITIVE Sensitive     NITROFURANTOIN <=16 SENSITIVE Sensitive     TRIMETH/SULFA <=20 SENSITIVE Sensitive     AMPICILLIN/SULBACTAM <=2 SENSITIVE Sensitive     PIP/TAZO <=4 SENSITIVE Sensitive     Extended ESBL NEGATIVE Sensitive     * >=100,000 COLONIES/mL ESCHERICHIA COLI   Klebsiella pneumoniae - MIC*    AMPICILLIN >=32 RESISTANT Resistant     CEFAZOLIN <=4 SENSITIVE Sensitive     CEFTRIAXONE <=1 SENSITIVE Sensitive     CIPROFLOXACIN <=0.25 SENSITIVE Sensitive     GENTAMICIN <=1 SENSITIVE Sensitive     IMIPENEM <=0.25 SENSITIVE Sensitive     NITROFURANTOIN 64 INTERMEDIATE Intermediate     TRIMETH/SULFA <=20 SENSITIVE Sensitive     AMPICILLIN/SULBACTAM 4 SENSITIVE Sensitive     PIP/TAZO <=4 SENSITIVE Sensitive     Extended ESBL NEGATIVE Sensitive     * >=100,000 COLONIES/mL KLEBSIELLA PNEUMONIAE  Respiratory Panel  by PCR     Status: None   Collection Time: 01/22/17  2:19 AM  Result Value Ref Range Status   Adenovirus NOT DETECTED NOT DETECTED Final   Coronavirus 229E NOT DETECTED NOT DETECTED Final   Coronavirus HKU1 NOT DETECTED NOT DETECTED Final   Coronavirus NL63 NOT DETECTED NOT DETECTED Final   Coronavirus OC43 NOT DETECTED NOT DETECTED Final   Metapneumovirus NOT DETECTED NOT DETECTED Final   Rhinovirus / Enterovirus NOT DETECTED NOT DETECTED Final   Influenza A NOT DETECTED NOT DETECTED Final   Influenza B NOT DETECTED NOT DETECTED Final   Parainfluenza Virus 1 NOT  DETECTED NOT DETECTED Final   Parainfluenza Virus 2 NOT DETECTED NOT DETECTED Final   Parainfluenza Virus 3 NOT DETECTED NOT DETECTED Final   Parainfluenza Virus 4 NOT DETECTED NOT DETECTED Final   Respiratory Syncytial Virus NOT DETECTED NOT DETECTED Final   Bordetella pertussis NOT DETECTED NOT DETECTED Final   Chlamydophila pneumoniae NOT DETECTED NOT DETECTED Final   Mycoplasma pneumoniae NOT DETECTED NOT DETECTED Final  Culture, blood (routine x 2)     Status: None   Collection Time: 01/24/17 12:24 PM  Result Value Ref Range Status   Specimen Description BLOOD RIGHT ANTECUBITAL  Final   Special Requests IN PEDIATRIC BOTTLE 3CC  Final   Culture NO GROWTH 5 DAYS  Final   Report Status 01/29/2017 FINAL  Final  Culture, blood (routine x 2)     Status: None   Collection Time: 01/24/17 12:24 PM  Result Value Ref Range Status   Specimen Description BLOOD RIGHT ANTECUBITAL  Final   Special Requests IN PEDIATRIC BOTTLE 2.5CC  Final   Culture NO GROWTH 5 DAYS  Final   Report Status 01/29/2017 FINAL  Final     Labs: Basic Metabolic Panel:  Recent Labs Lab 01/26/17 0620 01/28/17 0306 01/29/17 0318 01/30/17 0549 01/31/17 0539  NA 136 140 139 140 138  K 4.0 3.5 3.4* 4.0 4.1  CL 103 103 105 108 104  CO2 _0 GLUCOSE 89 88 86 84 82  BUN _1 CREATININE 0.70 0.70 0.63 0.71 0.70  CALCIUM 7.6*  7.7* 7.5* 7.4* 7.7*   CBC:  Recent Labs Lab 01/26/17 0620 01/28/17 0306 01/29/17 0318 01/30/17 0549 01/31/17 0539  WBC 8.5 8.5 9.1 9.6 8.8  NEUTROABS  --   --  6.2 6.7  --   HGB 10.1* 10.4* 10.1* 9.2* 10.0*  HCT 30.4* 31.8* 32.7* 29.0* 31.6*  MCV 93.8 96.1 97.3 95.1 96.9  PLT 233 240 245 253 252   BNP (last 3 results)  Recent Labs  12/03/16 0718  BNP 358.8*    Signed:  Barton Dubois MD.  [facility] 01/31/2017, 5:35 PM

## 2017-01-31 NOTE — Progress Notes (Signed)
Occupational Therapy Treatment Patient Details Name: Alex [name] MRN: [id number] DOB: Mar 12, 1931 Today's Date: 01/31/2017    History of present illness Patient is an 83 yo male admitted 01/21/17 with sepsis, pna, UTI, AMS, weakness.    PMH:  SDH 11/29/16 with one day stay on CIR - home on 01/10/17, COPD, Afib, CAD, CABG, OSA on O2 at night, pacemaker, AVR, obesity, HTN, HLD, depression, seizures.  Transfer to [employer] 4/5 plus TURP   OT comments  Participated well during OT session. Pt is very motivated and wants to return to CIR for rehab  Follow Up Recommendations  CIR    Equipment Recommendations   (to be further assessed)    Recommendations for Other Services Rehab consult    Precautions / Restrictions Precautions Precautions: Fall Precaution Comments: Monitor oxygen sats, HR and BP Restrictions Weight Bearing Restrictions: No       Mobility Bed Mobility         Supine to sit: Mod assist;+2 for physical assistance;HOB elevated     General bed mobility comments: max +2 for back to bed  Transfers                 General transfer comment: not attempted this session    Balance   Sitting-balance support: Bilateral upper extremity supported Sitting balance-[name]: Poor (to fair; min guard for safety) Sitting balance - Comments: pt wobbly in all directions                                   ADL either performed or assessed with clinical judgement   ADL       Grooming: Wash/dry face;Sitting;Min guard                                 General ADL Comments: sat EOB x 5 minutes.  His back began to hurt so returned to supine.  Pt was unsteady sitting EOB.  Encouraged use of bil Ues for support.  He tends not to use L, although he has movement. When supine, he was able to fold a washcloth without dropping it, although it was not folded neatly.  Did not have him hold (thick shake); would benefit from lidded mug for regular drink.   BP 88/71 supine and 94/56 sitting up.  No c/o dizziness.       Vision       Perception     Praxis      Cognition Arousal/Alertness: Awake/alert Behavior During Therapy: WFL for tasks assessed/performed                         Memory: Decreased short-term memory   Safety/Judgement: Decreased awareness of safety;Decreased awareness of deficits     General Comments: difficulty describing feeling in LUE; visitor reports he has had decreased coordination        Exercises Other Exercises Other Exercises: AROM LUE hand wrist and elbow.  AAROM for L shoulder    Shoulder Instructions       General Comments      Pertinent Vitals/ Pain       Pain Assessment: Faces Faces Pain Scale: Hurts little more Pain Location: back when sitting up Pain Descriptors / Indicators: Aching Pain Intervention(s): Limited activity within patient's tolerance;Monitored during session;Premedicated before session  Home Living Family/patient expects to be discharged  to:: Inpatient rehab Living Arrangements: Spouse/significant other                 Bathroom Shower/Tub: Walk-in Human resources officer: Standard     Home Equipment: Wheelchair - [employer] - 2 wheels   Additional Comments: wife can provide supervision level      Prior Functioning/Environment Level of Independence: Needs assistance    ADL's / Homemaking Assistance Needed: Assist with ADL's       Frequency  Min 3X/week        Progress Toward Goals  OT Goals(current goals can now be found in the care plan section)  Progress towards OT goals: Progressing toward goals     Plan      Co-evaluation                 End of Session    OT Visit Diagnosis: Muscle weakness (generalized) (M62.81);Hemiplegia and hemiparesis;Unsteadiness on feet (R26.81)   Activity Tolerance Patient tolerated treatment well   Patient Left in bed;with call bell/phone within reach;with bed alarm set;with  family/visitor present   Nurse Communication          Time: 2956-2130 OT Time Calculation (min): 41 min  Charges: OT General Charges $OT Visit: 1 Procedure (RN gave meds during session) OT Treatments $Self Care/Home Management : 8-22 mins $Therapeutic Activity: 8-22 mins  Alex [name], OTR/L [phone] 01/31/2017   Alex [name] 01/31/2017, 12:51 PM

## 2017-01-31 NOTE — Clinical Social Work Note (Signed)
CSW submitted for and received PASRR number - [id number], effective 01/31/17. MUST UJ#[id number].  Genelle Bal, MSW, LCSW Licensed Clinical Social Worker Clinical Social Work Department [employer] [phone]  e

## 2017-01-31 NOTE — Progress Notes (Signed)
OT Cancellation Note  Patient Details Name: Alex [name] MRN: [id number] DOB: March 29, 1931   Cancelled Treatment:    Reason Eval/Treat Not Completed: Other (comment). Pt remains on strict bedrest.  Text paged hospitalist to see if activity orders can be upgraded. Will check back  Marybella Ethier 01/31/2017, 9:04 AM  Marica Otter, OTR/L [phone] 01/31/2017

## 2017-01-31 NOTE — Care Management Note (Signed)
Received call from [place], rehab admission coordinator for inpt rehab at [facility]. She stated that pt can be D/C tomorrow and the ambulance needs to pick up the pt at 8:00 am. Will discuss ambulance transportation with RN.

## 2017-01-31 NOTE — Progress Notes (Signed)
Palliative Medicine Team Progress note  83 y.o. male  with past medical history of COPD, A. Fib, CAD s/p CABG, OSA on oxygen at night, s/p PPM, s/p AVR,morbid obesity. He was recently hospitalized in 11/2016 for traumatic subdural hematoma requiring craniotomy. He had prolonged rehabilitation stay from which he was discharged home with 24-hour nursing care approx 2 weeks ago, now admitted on 01/21/2017 with sepsis, UTI, PNA, and klebsiella bacteremia.  Palliative consulted for goals of care.   Elderly gentleman resting in bed Awake alert this afternoon, responds appropriately Participating with PT  BP (!) 87/71   Pulse (!) 102   Temp 98.4 F (36.9 C) (Oral)   Resp 20   Ht  (1.88 m)   Wt 106.6 kg (235 lb 0.2 oz)   SpO2 93%   BMI 30.17 kg/m  Labs noted.  Elderly gentleman sitting by the edge of his bed Upper extremity edema bilateral, L worse than R   awake/alert S1 S2 Abdomen soft Clear   breath sounds       Discussed with wife and patient advocate French Ana from [facility] at the bedside. They are happy with the patient's care and progress thus far in this hospitalization. French Ana tells me that she has been informed that the patient has been approved for CIR. Mr Enslin is awake alert, in no distress, he has no questions or concerns.   Palliative is following peripherally, to be an extra layer of support for the family.   Offered active listening and supportive care, supporting the family as they continue to address goals of care, based on the patient's disease trajectory.   15 minutes spent.  Rosalin Hawking MD [facility] health palliative medicine team [phone]

## 2017-01-31 NOTE — Progress Notes (Signed)
[facility]  Transport  [phone]) arranged for  8 am pickup per request Jeannie [phone].  rehabilitation liaison.

## 2017-01-31 NOTE — Progress Notes (Signed)
[facility] inpatient rehab admissions - I have authorization for acute inpatient rehab admission.  I am planning to admit tomorrow, Sunday, to inpatient rehab.  I would like to have patient picked up via ambulance transport at 8 am tomorrow.  I have talked to the RN today and to the attending MD and to the case manager.  I have let patient's wife and friend, Haynes Bast, know of the above.  Bed available and will admit to inpatient rehab in the morning.  Call me for questions.  #[phone]

## 2017-01-31 NOTE — Progress Notes (Signed)
PROGRESS NOTE    Alex [name]  YWV:[id number] DOB: [birth year] DOA: 01/21/2017 PCP: Derrill Center, MD    Brief Narrative:  83 y.o.malewith medical history significant for COPD, A. Fib, CAD s/p CABG, OSA on oxygen at night, s/p PPM, s/p AVR,morbid obesity. He was recently hospitalized in 11/2016 for traumatic subdural hematoma requiring craniotomy. He had prolonged rehabilitation stay for which he was just recently discharged home with 24-hour nursing care 1-1/2weeks ago PTA. Patient has chronic atrial fibrillation and had anunsuccessful trial of cardioversion on 1/25 with Dr. Harrington Challenger. Following the subdural hematoma patient was taken off of Coumadin and has only been on a baby aspirin daily.   Pt presented to [facility] with generalized weakness for past 3 days prior to this admission. He also was more confused especially in the evenings. Pt reported having subjective fevers, decreased appetite.  In ED, pt had fever of 101.29F, pulse rate 38-81, respirations 17-28, blood pressure as low as 80/42, and O2 satsmaintained above 90% on 2 L. Labs revealed normal WBC, hemoglobin 10.7, albumin 1.7, AST 95, ALT 97, lactic acid 1.98, and all other labs relatively unremarkable. Urinalysis showed trace leukocytes. CXR showed bibasilar infiltrates, possible aspiration. No acute findings on CT head. He was started on empiric abx for UTI and pneumonia.  Barrier to discharge: Dr Charlies Silvers spoke with pt daughter over the phone; I have talked to her about the plan of care (obtaining ID consultation, LUE doppler), we also talked about how plan is for continuing to monitor fever curve, not sure why is he still spiking fever. I assured pt daughter that there was no fever yesterday but she said he has fever every single day rectally measured. I also let pt know of my cell# to call me for questions or concerns. She then mentioned to me that I am not working tomorrow "I looked you up on amion". I still offered her to call me as I  am not sure what my schedule will be tomorrow.  ID was consulted and left upper extremity Dopplers obtained due to swelling left upper extremity however pending at this time. CT abdomen and pelvis which was obtained was worrisome for prostatitis and prostatic abscess. Urology was consulted assessed the patient and recommended unroofing of prostatic abscess. Procedure done on 01/29/17, well tolerated. Now afebrile for > 24 hours. Will transitioned antibiotics to PO and will be transfer to CIR on 02/01/17 for rehabilitation.   Assessment & Plan:   Principal Problem:   Prostate abscess Active Problems:   3-vessel coronary artery disease   Hyperlipidemia, mixed   Persistent atrial fibrillation (HCC)   Atrial fibrillation (HCC)   Sepsis (HCC)   Transient hypotension   HCAP (healthcare-associated pneumonia)   Bacteremia due to Klebsiella pneumoniae   Left arm swelling   FUO (fever of unknown origin)   Fever   BPH with urinary obstruction: Per urology 01/29/2017   Acute deep vein thrombosis (DVT) of left upper extremity (HCC)  Sepsis secondary to bibasilar pneumonia, possible aspiration pneumonitis / UTI secondary to E.COli and Klebsiella pneumoniae ([place]) / Klebsiella pneumoniae bacteremia/prostatitis and prostatic abscess. - Sepsis criteria met on admission with fever, tachypnea, tachycardia, hypotension. Source of infection is pneumonia and UTI and prostatitis/prostatic abscess noted on CT abdomen and pelvis 01/27/2017. - Urine cx grew E.Coli and Klebsiella pneumoniae - Blood cx grew Klebsiella pneumoniae - Repeat blood cx 3/31 so far showed no growth  - Resp virus panel negative  - Vanco and zosyn stopped 3/29 and narrowed to  rocephin  - Pt subsequently transitioned to oral Bactrim and planning to complete 3 weeks of antibiotics -Appreciate ID and urology assistance/recommendations and care.  Prostatitis and prostatic abscess -Per CT abdomen and pelvis 01/27/2017.  -Patient also noted to be  spiking fevers intermittently.  -Urology was consulted and recommended unroofing of prostatic abscess  -patient taken to the OR on 01/29/17; tolerated procedure well -no further fevers now -per ID will switch abx's to Bactrim DS BID for 3 weeks -patient to follow up with urology as an outpatient.  Left upper extremity swelling/LUE DVT - Some improvement with left upper extremity swelling. Upper extremity Dopplers demonstrated positive DVT on brachial Vein. -Due to recent subdural hematoma requiring craniotomy February 2018 patient not a candidate for anticoagulation at this time.   Persistent chronic atrial fibrillation (HCC) -CHADS vasc score at least 3 -Currently rate controlled on metoprolol and amiodarone. Not on coumadin due to subdural hematoma; he is on aspirin 81 mg daily, which would be continued.  Chronic diastolic CHF -Compensated overall -plan is to Continue daily lasix and B-blocker    Essential hypertension - Continue metoprolol, dose adjusted and with holding parameters (hold for SBP < 95)  Dyslipidemia/HLD - Continue Lipitor   Depression  - Continue Lexapro  -no SI or hallucinations   Seizure disorder - Patient with no noted seizures. - Continue Keppra   -Fever Likely secondary to prostatitis and prostatic abscess.  -now afebrile > 24 hours -will continue antibiotics as per ID recommendations    DVT prophylaxis: SCDs Code Status: DO NOT RESUSCITATE Family Communication:  Updated. Spoke to wife at bedside.  Disposition Plan: Final disposition is to CIR on 02/01/17. Patient has tolerated antibiotics transition to PO. Now afebrile for > 48 hours.   Consultants:   Inpatient rehabilitation Dr.Swartz 01/26/2017  Palliative care: Dr. Domingo Cocking 01/26/2017  Infectious diseases: Dr. Tommy Medal 01/27/2017  Urology: Dr.Ottelin 01/28/2017  Procedures:   CT abdomen and pelvis 01/27/2017  Left upper extremity Dopplers pending.  Chest x-ray 01/25/2017,  01/21/2017  Antimicrobials:   IV Rocephin 01/22/2017>>>>01/30/17  IV Zosyn 01/21/2017>>>01/22/17  IV vancomycin 01/21/2017>>> 01/22/2017  Bactrim 01/30/17>>>02/20/17 (3 weeks of antibiotics)  Subjective: Patient denies any chest pain. No major complaints of shortness of breath, but still using [place] supplementation. Patient states feeling better and stable. AAOX3.  Objective: Vitals:   01/31/17 0446 01/31/17 1000 01/31/17 1100 01/31/17 1400  BP: (!) 95/57 (!) 87/71 (!) 87/71 (!) 90/57  Pulse: 89 (!) 102 (!) 102 100  Resp: 20   18  Temp: 98.4 F (36.9 C)   97.9 F (36.6 C)  TempSrc: Oral   Oral  SpO2: 93%   92%  Weight: 106.6 kg (235 lb 0.2 oz)     Height:        Intake/Output Summary (Last 24 hours) at 01/31/17 1726 Last data filed at 01/31/17 1444  Gross per 24 hour  Intake              500 ml  Output              250 ml  Net              250 ml   Filed Weights   01/29/17 0456 01/30/17 0600 01/31/17 0446  Weight: 105 kg (231 lb 8 oz) 105 kg (231 lb 7.7 oz) 106.6 kg (235 lb 0.2 oz)    Examination: General exam: Appears calm and comfortable. No fever. Wearing [place] with 2L oxygen supplementation. Denies CP. Respiratory system:  Clear to auscultation anterior lung fields. Respiratory effort normal. No use of accessory muscles. Cardiovascular system: S1 & S2 heard. No JVD, murmurs, rubs, gallops or clicks.  Gastrointestinal system: Abdomen is nondistended, soft and nontender. No organomegaly or masses felt. Normal bowel sounds heard. Central nervous system: Alert and oriented. No focal neurological deficits. Extremities: trace edema bilaterally on his LE; also with LUE swelling and decrease strength, per patient due to swelling. Psychiatry: Judgement and insight appear normal. Mood & affect appropriate.   Data Reviewed: I have personally reviewed following labs and imaging studies  CBC:  Recent Labs Lab 01/26/17 0620 01/28/17 0306 01/29/17 0318 01/30/17 0549 01/31/17 0539   WBC 8.5 8.5 9.1 9.6 8.8  NEUTROABS  --   --  6.2 6.7  --   HGB 10.1* 10.4* 10.1* 9.2* 10.0*  HCT 30.4* 31.8* 32.7* 29.0* 31.6*  MCV 93.8 96.1 97.3 95.1 96.9  PLT 233 240 245 253 270   Basic Metabolic Panel:  Recent Labs Lab 01/26/17 0620 01/28/17 0306 01/29/17 0318 01/30/17 0549 01/31/17 0539  NA 136 140 139 140 138  K 4.0 3.5 3.4* 4.0 4.1  CL 103 103 105 108 104  CO2 24 29 27 28 29   GLUCOSE 89 88 86 84 82  BUN 14 14 11 14 15   CREATININE 0.70 0.70 0.63 0.71 0.70  CALCIUM 7.6* 7.7* 7.5* 7.4* 7.7*   GFR: Estimated Creatinine Clearance: 89.4 mL/min (by C-G formula based on SCr of 0.7 mg/dL).  Sepsis Labs: No results for input(s): PROCALCITON, LATICACIDVEN in the last 168 hours.  Recent Results (from the past 240 hour(s))  Culture, blood (Routine x 2)     Status: None   Collection Time: 01/21/17  8:10 PM  Result Value Ref Range Status   Specimen Description BLOOD LEFT HAND  Final   Special Requests IN PEDIATRIC BOTTLE 4CC  Final   Culture NO GROWTH 5 DAYS  Final   Report Status 01/26/2017 FINAL  Final  Culture, blood (Routine x 2)     Status: Abnormal   Collection Time: 01/21/17  8:20 PM  Result Value Ref Range Status   Specimen Description BLOOD RIGHT ANTECUBITAL  Final   Special Requests BOTTLES DRAWN AEROBIC AND ANAEROBIC 5CC  Final   Culture  Setup Time   Final    GRAM NEGATIVE RODS AEROBIC BOTTLE ONLY CRITICAL RESULT CALLED TO, READ BACK BY AND VERIFIED WITH: E. MARTIN PHARMD, AT 1415 01/22/17 BY D. VANHOOK    Culture KLEBSIELLA PNEUMONIAE (A)  Final   Report Status 01/24/2017 FINAL  Final   Organism ID, Bacteria KLEBSIELLA PNEUMONIAE  Final      Susceptibility   Klebsiella pneumoniae - MIC*    AMPICILLIN >=32 RESISTANT Resistant     CEFAZOLIN <=4 SENSITIVE Sensitive     CEFEPIME <=1 SENSITIVE Sensitive     CEFTAZIDIME <=1 SENSITIVE Sensitive     CEFTRIAXONE <=1 SENSITIVE Sensitive     CIPROFLOXACIN 1 SENSITIVE Sensitive     GENTAMICIN <=1 SENSITIVE  Sensitive     IMIPENEM <=0.25 SENSITIVE Sensitive     TRIMETH/SULFA 40 SENSITIVE Sensitive     AMPICILLIN/SULBACTAM 16 INTERMEDIATE Intermediate     PIP/TAZO 8 SENSITIVE Sensitive     Extended ESBL NEGATIVE Sensitive     * KLEBSIELLA PNEUMONIAE  Blood Culture ID Panel (Reflexed)     Status: Abnormal   Collection Time: 01/21/17  8:20 PM  Result Value Ref Range Status   Enterococcus species NOT DETECTED NOT DETECTED Final  Listeria monocytogenes NOT DETECTED NOT DETECTED Final   Staphylococcus species NOT DETECTED NOT DETECTED Final   Staphylococcus aureus NOT DETECTED NOT DETECTED Final   Streptococcus species NOT DETECTED NOT DETECTED Final   Streptococcus agalactiae NOT DETECTED NOT DETECTED Final   Streptococcus pneumoniae NOT DETECTED NOT DETECTED Final   Streptococcus pyogenes NOT DETECTED NOT DETECTED Final   Acinetobacter baumannii NOT DETECTED NOT DETECTED Final   Enterobacteriaceae species DETECTED (A) NOT DETECTED Final    Comment: Enterobacteriaceae represent a large family of gram-negative bacteria, not a single organism. CRITICAL RESULT CALLED TO, READ BACK BY AND VERIFIED WITH: E. MARTIN PHARMD, AT 8921 01/22/17 BY D. VANHOOK    Enterobacter cloacae complex NOT DETECTED NOT DETECTED Final   Escherichia coli NOT DETECTED NOT DETECTED Final   Klebsiella oxytoca NOT DETECTED NOT DETECTED Final   Klebsiella pneumoniae DETECTED (A) NOT DETECTED Final    Comment: CRITICAL RESULT CALLED TO, READ BACK BY AND VERIFIED WITH: E. MARTIN PHARMD, AT 1415 01/22/17 BY D. VANHOOK    Proteus species NOT DETECTED NOT DETECTED Final   Serratia marcescens NOT DETECTED NOT DETECTED Final   Carbapenem resistance NOT DETECTED NOT DETECTED Final   Haemophilus influenzae NOT DETECTED NOT DETECTED Final   Neisseria meningitidis NOT DETECTED NOT DETECTED Final   Pseudomonas aeruginosa NOT DETECTED NOT DETECTED Final   Candida albicans NOT DETECTED NOT DETECTED Final   Candida glabrata NOT  DETECTED NOT DETECTED Final   Candida krusei NOT DETECTED NOT DETECTED Final   Candida parapsilosis NOT DETECTED NOT DETECTED Final   Candida tropicalis NOT DETECTED NOT DETECTED Final  Urine culture     Status: Abnormal   Collection Time: 01/21/17  8:31 PM  Result Value Ref Range Status   Specimen Description URINE, CATHETERIZED  Final   Special Requests NONE  Final   Culture (A)  Final    >=100,000 COLONIES/mL ESCHERICHIA COLI >=100,000 COLONIES/mL KLEBSIELLA PNEUMONIAE    Report Status 01/24/2017 FINAL  Final   Organism ID, Bacteria ESCHERICHIA COLI (A)  Final   Organism ID, Bacteria KLEBSIELLA PNEUMONIAE (A)  Final      Susceptibility   Escherichia coli - MIC*    AMPICILLIN <=2 SENSITIVE Sensitive     CEFAZOLIN <=4 SENSITIVE Sensitive     CEFTRIAXONE <=1 SENSITIVE Sensitive     CIPROFLOXACIN >=4 RESISTANT Resistant     GENTAMICIN <=1 SENSITIVE Sensitive     IMIPENEM <=0.25 SENSITIVE Sensitive     NITROFURANTOIN <=16 SENSITIVE Sensitive     TRIMETH/SULFA <=20 SENSITIVE Sensitive     AMPICILLIN/SULBACTAM <=2 SENSITIVE Sensitive     PIP/TAZO <=4 SENSITIVE Sensitive     Extended ESBL NEGATIVE Sensitive     * >=100,000 COLONIES/mL ESCHERICHIA COLI   Klebsiella pneumoniae - MIC*    AMPICILLIN >=32 RESISTANT Resistant     CEFAZOLIN <=4 SENSITIVE Sensitive     CEFTRIAXONE <=1 SENSITIVE Sensitive     CIPROFLOXACIN <=0.25 SENSITIVE Sensitive     GENTAMICIN <=1 SENSITIVE Sensitive     IMIPENEM <=0.25 SENSITIVE Sensitive     NITROFURANTOIN 64 INTERMEDIATE Intermediate     TRIMETH/SULFA <=20 SENSITIVE Sensitive     AMPICILLIN/SULBACTAM 4 SENSITIVE Sensitive     PIP/TAZO <=4 SENSITIVE Sensitive     Extended ESBL NEGATIVE Sensitive     * >=100,000 COLONIES/mL KLEBSIELLA PNEUMONIAE  Respiratory Panel by PCR     Status: None   Collection Time: 01/22/17  2:19 AM  Result Value Ref Range Status   Adenovirus NOT DETECTED  NOT DETECTED Final   Coronavirus 229E NOT DETECTED NOT DETECTED  Final   Coronavirus HKU1 NOT DETECTED NOT DETECTED Final   Coronavirus NL63 NOT DETECTED NOT DETECTED Final   Coronavirus OC43 NOT DETECTED NOT DETECTED Final   Metapneumovirus NOT DETECTED NOT DETECTED Final   Rhinovirus / Enterovirus NOT DETECTED NOT DETECTED Final   Influenza A NOT DETECTED NOT DETECTED Final   Influenza B NOT DETECTED NOT DETECTED Final   Parainfluenza Virus 1 NOT DETECTED NOT DETECTED Final   Parainfluenza Virus 2 NOT DETECTED NOT DETECTED Final   Parainfluenza Virus 3 NOT DETECTED NOT DETECTED Final   Parainfluenza Virus 4 NOT DETECTED NOT DETECTED Final   Respiratory Syncytial Virus NOT DETECTED NOT DETECTED Final   Bordetella pertussis NOT DETECTED NOT DETECTED Final   Chlamydophila pneumoniae NOT DETECTED NOT DETECTED Final   Mycoplasma pneumoniae NOT DETECTED NOT DETECTED Final  Culture, blood (routine x 2)     Status: None   Collection Time: 01/24/17 12:24 PM  Result Value Ref Range Status   Specimen Description BLOOD RIGHT ANTECUBITAL  Final   Special Requests IN PEDIATRIC BOTTLE 3CC  Final   Culture NO GROWTH 5 DAYS  Final   Report Status 01/29/2017 FINAL  Final  Culture, blood (routine x 2)     Status: None   Collection Time: 01/24/17 12:24 PM  Result Value Ref Range Status   Specimen Description BLOOD RIGHT ANTECUBITAL  Final   Special Requests IN PEDIATRIC BOTTLE 2.5CC  Final   Culture NO GROWTH 5 DAYS  Final   Report Status 01/29/2017 FINAL  Final     Scheduled Meds: . ALPRAZolam  0.25 mg Oral QHS  . amiodarone  200 mg Oral BID  . aspirin  81 mg Oral Daily  . atorvastatin  40 mg Oral Daily  . escitalopram  10 mg Oral QHS  . furosemide  20 mg Oral Daily  . levETIRAcetam  500 mg Oral BID  . magnesium oxide  200 mg Oral Daily  . mouth rinse  15 mL Mouth Rinse BID  . metoprolol succinate  12.5 mg Oral Daily  . sulfamethoxazole-trimethoprim  1 tablet Oral Q12H      LOS: 9 days    Time spent: 25 minutes    Barton Dubois, MD [facility] Pager [phone]  If 7PM-7AM, please contact night-coverage [email or web address] Password [facility] 01/31/2017, 5:26 PM

## 2017-02-01 ENCOUNTER — Inpatient Hospital Stay (HOSPITAL_COMMUNITY)
Admission: RE | Admit: 2017-02-01 | Discharge: 2017-02-18 | DRG: 949 | Disposition: A | Discharge status: Skilled Nursing Facility | Payer: Medicare Other | Source: Intra-hospital | Attending: Physical Medicine & Rehabilitation | Admitting: Physical Medicine & Rehabilitation

## 2017-02-01 ENCOUNTER — Encounter (HOSPITAL_COMMUNITY): Payer: Self-pay

## 2017-02-01 DIAGNOSIS — D62 Acute posthemorrhagic anemia: Secondary | ICD-10-CM | POA: Diagnosis present

## 2017-02-01 DIAGNOSIS — I5033 Acute on chronic diastolic (congestive) heart failure: Secondary | ICD-10-CM

## 2017-02-01 DIAGNOSIS — R531 Weakness: Secondary | ICD-10-CM | POA: Diagnosis present

## 2017-02-01 DIAGNOSIS — Z9889 Other specified postprocedural states: Secondary | ICD-10-CM | POA: Diagnosis not present

## 2017-02-01 DIAGNOSIS — I5032 Chronic diastolic (congestive) heart failure: Secondary | ICD-10-CM | POA: Diagnosis present

## 2017-02-01 DIAGNOSIS — Z79899 Other long term (current) drug therapy: Secondary | ICD-10-CM

## 2017-02-01 DIAGNOSIS — Z6830 Body mass index (BMI) 30.0-30.9, adult: Secondary | ICD-10-CM | POA: Diagnosis not present

## 2017-02-01 DIAGNOSIS — I481 Persistent atrial fibrillation: Secondary | ICD-10-CM | POA: Diagnosis present

## 2017-02-01 DIAGNOSIS — Z2989 Encounter for other specified prophylactic measures: Secondary | ICD-10-CM

## 2017-02-01 DIAGNOSIS — Z7982 Long term (current) use of aspirin: Secondary | ICD-10-CM

## 2017-02-01 DIAGNOSIS — Z298 Encounter for other specified prophylactic measures: Secondary | ICD-10-CM

## 2017-02-01 DIAGNOSIS — I959 Hypotension, unspecified: Secondary | ICD-10-CM | POA: Diagnosis present

## 2017-02-01 DIAGNOSIS — G4733 Obstructive sleep apnea (adult) (pediatric): Secondary | ICD-10-CM | POA: Diagnosis present

## 2017-02-01 DIAGNOSIS — R7989 Other specified abnormal findings of blood chemistry: Secondary | ICD-10-CM | POA: Diagnosis not present

## 2017-02-01 DIAGNOSIS — R7881 Bacteremia: Secondary | ICD-10-CM

## 2017-02-01 DIAGNOSIS — I110 Hypertensive heart disease with heart failure: Secondary | ICD-10-CM | POA: Diagnosis present

## 2017-02-01 DIAGNOSIS — E785 Hyperlipidemia, unspecified: Secondary | ICD-10-CM | POA: Diagnosis present

## 2017-02-01 DIAGNOSIS — N412 Abscess of prostate: Secondary | ICD-10-CM

## 2017-02-01 DIAGNOSIS — N400 Enlarged prostate without lower urinary tract symptoms: Secondary | ICD-10-CM | POA: Diagnosis present

## 2017-02-01 DIAGNOSIS — Z950 Presence of cardiac pacemaker: Secondary | ICD-10-CM

## 2017-02-01 DIAGNOSIS — I480 Paroxysmal atrial fibrillation: Secondary | ICD-10-CM | POA: Diagnosis not present

## 2017-02-01 DIAGNOSIS — Z87891 Personal history of nicotine dependence: Secondary | ICD-10-CM | POA: Diagnosis not present

## 2017-02-01 DIAGNOSIS — Z952 Presence of prosthetic heart valve: Secondary | ICD-10-CM

## 2017-02-01 DIAGNOSIS — Z86718 Personal history of other venous thrombosis and embolism: Secondary | ICD-10-CM

## 2017-02-01 DIAGNOSIS — I82622 Acute embolism and thrombosis of deep veins of left upper extremity: Secondary | ICD-10-CM | POA: Diagnosis present

## 2017-02-01 DIAGNOSIS — F0390 Unspecified dementia without behavioral disturbance: Secondary | ICD-10-CM

## 2017-02-01 DIAGNOSIS — R5381 Other malaise: Secondary | ICD-10-CM | POA: Diagnosis not present

## 2017-02-01 DIAGNOSIS — F329 Major depressive disorder, single episode, unspecified: Secondary | ICD-10-CM | POA: Diagnosis present

## 2017-02-01 DIAGNOSIS — J449 Chronic obstructive pulmonary disease, unspecified: Secondary | ICD-10-CM

## 2017-02-01 DIAGNOSIS — S069X2D Unspecified intracranial injury with loss of consciousness of 31 minutes to 59 minutes, subsequent encounter: Principal | ICD-10-CM

## 2017-02-01 DIAGNOSIS — Z951 Presence of aortocoronary bypass graft: Secondary | ICD-10-CM | POA: Diagnosis not present

## 2017-02-01 DIAGNOSIS — I4891 Unspecified atrial fibrillation: Secondary | ICD-10-CM | POA: Diagnosis not present

## 2017-02-01 DIAGNOSIS — J42 Unspecified chronic bronchitis: Secondary | ICD-10-CM | POA: Diagnosis not present

## 2017-02-01 DIAGNOSIS — I2510 Atherosclerotic heart disease of native coronary artery without angina pectoris: Secondary | ICD-10-CM | POA: Diagnosis present

## 2017-02-01 DIAGNOSIS — I951 Orthostatic hypotension: Secondary | ICD-10-CM | POA: Diagnosis not present

## 2017-02-01 DIAGNOSIS — F419 Anxiety disorder, unspecified: Secondary | ICD-10-CM | POA: Diagnosis present

## 2017-02-01 DIAGNOSIS — Z8679 Personal history of other diseases of the circulatory system: Secondary | ICD-10-CM

## 2017-02-01 DIAGNOSIS — I10 Essential (primary) hypertension: Secondary | ICD-10-CM

## 2017-02-01 DIAGNOSIS — I952 Hypotension due to drugs: Secondary | ICD-10-CM | POA: Diagnosis not present

## 2017-02-01 DIAGNOSIS — F411 Generalized anxiety disorder: Secondary | ICD-10-CM

## 2017-02-01 DIAGNOSIS — F09 Unspecified mental disorder due to known physiological condition: Secondary | ICD-10-CM | POA: Diagnosis not present

## 2017-02-01 MED ORDER — ATORVASTATIN CALCIUM 40 MG PO TABS
40.0000 mg | ORAL_TABLET | Freq: Every day | ORAL | Status: DC
  Administered 2017-02-01 – 2017-02-17 (×17): 40 mg via ORAL
  Filled 2017-02-01 (×6): qty 1
  Filled 2017-02-01: qty 2
  Filled 2017-02-01 (×10): qty 1

## 2017-02-01 MED ORDER — SULFAMETHOXAZOLE-TRIMETHOPRIM 800-160 MG PO TABS
1.0000 | ORAL_TABLET | Freq: Two times a day (BID) | ORAL | Status: DC
  Administered 2017-02-01 – 2017-02-18 (×34): 1 via ORAL
  Filled 2017-02-01 (×35): qty 1

## 2017-02-01 MED ORDER — MAGNESIUM OXIDE 400 (241.3 MG) MG PO TABS
200.0000 mg | ORAL_TABLET | Freq: Every day | ORAL | Status: DC
  Administered 2017-02-01 – 2017-02-03 (×3): 200 mg via ORAL
  Administered 2017-02-04: 400 mg via ORAL
  Administered 2017-02-05 – 2017-02-07 (×3): 200 mg via ORAL
  Administered 2017-02-08: 08:00:00 via ORAL
  Administered 2017-02-09 – 2017-02-18 (×10): 200 mg via ORAL
  Filled 2017-02-01 (×17): qty 1

## 2017-02-01 MED ORDER — IPRATROPIUM-ALBUTEROL 0.5-2.5 (3) MG/3ML IN SOLN
3.0000 mL | Freq: Four times a day (QID) | RESPIRATORY_TRACT | Status: DC | PRN
  Administered 2017-02-01: 3 mL via RESPIRATORY_TRACT
  Filled 2017-02-01: qty 3

## 2017-02-01 MED ORDER — IPRATROPIUM-ALBUTEROL 0.5-2.5 (3) MG/3ML IN SOLN: 3.0000 mL | Freq: Four times a day (QID) | RESPIRATORY_TRACT | Status: DC | PRN

## 2017-02-01 MED ORDER — ASPIRIN EC 81 MG PO TBEC
81.0000 mg | DELAYED_RELEASE_TABLET | Freq: Every day | ORAL | Status: DC
  Administered 2017-02-01 – 2017-02-18 (×18): 81 mg via ORAL
  Filled 2017-02-01 (×18): qty 1

## 2017-02-01 MED ORDER — ESCITALOPRAM OXALATE 10 MG PO TABS
10.0000 mg | ORAL_TABLET | Freq: Every day | ORAL | Status: DC
  Administered 2017-02-01 – 2017-02-09 (×9): 10 mg via ORAL
  Filled 2017-02-01 (×9): qty 1

## 2017-02-01 MED ORDER — SULFAMETHOXAZOLE-TRIMETHOPRIM 800-160 MG PO TABS
1.0000 | ORAL_TABLET | Freq: Two times a day (BID) | ORAL | Status: DC
  Filled 2017-02-01 (×2): qty 1

## 2017-02-01 MED ORDER — FUROSEMIDE 20 MG PO TABS
20.0000 mg | ORAL_TABLET | Freq: Every day | ORAL | Status: DC
  Administered 2017-02-01 – 2017-02-10 (×10): 20 mg via ORAL
  Filled 2017-02-01 (×10): qty 1

## 2017-02-01 MED ORDER — OXYCODONE HCL 5 MG PO TABS: 5.0000 mg | ORAL_TABLET | ORAL | Status: DC | PRN

## 2017-02-01 MED ORDER — ALPRAZOLAM 0.25 MG PO TABS
0.2500 mg | ORAL_TABLET | Freq: Every day | ORAL | Status: DC
  Administered 2017-02-01 – 2017-02-17 (×17): 0.25 mg via ORAL
  Filled 2017-02-01 (×17): qty 1

## 2017-02-01 MED ORDER — LEVETIRACETAM 500 MG PO TABS
500.0000 mg | ORAL_TABLET | Freq: Two times a day (BID) | ORAL | Status: DC
  Administered 2017-02-01 – 2017-02-18 (×35): 500 mg via ORAL
  Filled 2017-02-01 (×36): qty 1

## 2017-02-01 MED ORDER — AMIODARONE HCL 200 MG PO TABS
200.0000 mg | ORAL_TABLET | Freq: Two times a day (BID) | ORAL | Status: DC
  Administered 2017-02-01 – 2017-02-13 (×24): 200 mg via ORAL
  Filled 2017-02-01 (×25): qty 1

## 2017-02-01 NOTE — H&P (Addendum)
Physical Medicine and Rehabilitation Admission H&P    Chief Complaint  Patient presents with  . Weakness  : HPI: Alex [name] is a 83 y.o. 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 Presented 01/21/2017 with decreased appetite, low-grade fever, hypotension and dry cough. Chest x-ray suspect by basilar infiltrates/bony. Possible aspiration. CT of the head reviewed, no acute abnormality. Urine study negative nitrites. Lactic acid 1.98, WBC 7.4, BUN 19, creatinine 0.91. Maintain on sepsis protocol broad-spectrum antibiotics. IV fluids added to maintain blood pressure. Blood cultures grew out Klebsiella pneumoniae, urine culture Escherichia coli as well as Klebsiella. Cardiac rate remained controlled maintained on low-dose aspirin as prior to admission. He remains on a regular diet.Request made for palliative care consult to discuss goals of care. Physical and occupational therapy evaluation completed  with recommendations of physical medicine rehabilitation consult. Patient was admitted for a comprehensive rehabilitation program.  ID was consulted and left upper extremity dopplers were obtained due to swelling left upper extremity.  CT abdomen and pelvis obtained was worrisome for prostatitis and prostatic abscess Urology was consulted and recommended unroofing of prostatic abscess.  Procedure and TURP were done after transfer to [facility] on 01/29/17.  Is now afebrile and transitioned to PO antibiotics.  PT/OT progress with recommendations for CIR.  Patient is very motivated.   Review of Systems  Constitutional: Negative for chills  and fever.  Musculoskeletal: Negative for back pain and myalgias.  Neurological: Positive for weakness. Negative for sensory change and focal weakness.  All other systems reviewed and are negative.  Past Medical History:  Diagnosis Date  . Aortic stenosis    s/p AVR by Dr Cyndia Bent  . COPD (chronic obstructive pulmonary disease) ([place])   . History of coronary artery disease    status post stenting of the marginal circumflex in 12/2003 and again in 2009  . Hyperlipidemia   . Morbid obesity (H[facility])    weight 243 pounds, BMI 31.2kg/m2, BSA 2.36 square meters  . Obstructive sleep apnea    compliant with CPAP  . Persistent atrial fibrillation [facility])    Past Surgical History:  Procedure Laterality Date  . AORTIC VALVE REPLACEMENT (AVR)/CORONARY ARTERY BYPASS GRAFTING (CABG)   08/05/2011   LIMA to LAD, sequential saphenous vein graft to third and fourth obtuse marginal branches of the circumflex, aortic valve replacement using a 23 mm Edwards pericardial valve  . APPENDECTOMY    . CARDIOVERSION N/A 11/14/2014   Procedure: CARDIOVERSION;  Surgeon: Candee Furbish, MD;  Location: [facility] ENDOSCOPY;  Service: Cardiovascular;  Laterality: N/A;  . CARDIOVERSION N/A 11/20/2016   Procedure: CARDIOVERSION;  Surgeon: Fay Records, MD;  Location: [facility];  Service: Cardiovascular;  Laterality: N/A;  . CAROTID ENDARTERECTOMY     Dr Levi Aland  . CRANIOTOMY Right 12/16/2016   Procedure: CRANIOTOMY HEMATOMA EVACUATION SUBDURAL;  Surgeon: Kevan Ny Ditty, MD;  Location: [facility];  Service: Neurosurgery;  Laterality: Right;  . EP IMPLANTABLE DEVICE N/A 11/06/2015   Procedure: PPM Generator Changeout;for sick sinus syndrome with a MDT Adapta L PPM, chronically elevated RV threshold.  . permanent pacemaker     MDT EnRhythm implanted by Dr Sherilyn Banker in [place] for complete heart block with  syncope  . REPLACEMENT TOTAL KNEE BILATERAL     2006  . TEE WITHOUT CARDIOVERSION N/A 11/14/2014   Procedure: TRANSESOPHAGEAL  ECHOCARDIOGRAM (TEE);  Surgeon: Candee Furbish, MD;  Location: [facility] ENDOSCOPY;  Service: Cardiovascular;  Laterality: N/A;  . TRANSURETHRAL INCISION OF PROSTATE N/A 01/29/2017   Procedure: TRANSURETHRAL UNROOFING OF A  PROSTATE ABSCESS;  Surgeon: Kathie Rhodes, MD;  Location: [facility] ORS;  Service: Urology;  Laterality: N/A;   Family History  Problem Relation Age of Onset  . Alzheimer's disease Mother   . Tuberculosis Father   . Other Father     Spinal Meningitis   Social History:  reports that he quit smoking about 13 years ago. His smoking use included Cigarettes. He started smoking about 43 years ago. He has a 15.00 pack-year smoking history. He has never used smokeless tobacco. He reports that he drinks alcohol. He reports that he does not use drugs. Allergies: No Known Allergies Medications Prior to Admission  Medication Sig Dispense Refill  . acetaminophen (TYLENOL) 500 MG tablet Take 1,000 mg by mouth every 6 (six) hours as needed.    . ALPRAZolam (XANAX) 0.25 MG tablet Take 1 tablet (0.25 mg total) by mouth at bedtime. 30 tablet 0  . amiodarone (PACERONE) 200 MG tablet Take 1 tablet (200 mg total) by mouth 2 (two) times daily. 60 tablet 1  . aspirin 81 MG tablet Take 81 mg by mouth daily.    Marland Kitchen atorvastatin (LIPITOR) 40 MG tablet Take 1 tablet (40 mg total) by mouth daily. 30 tablet 1  . B Complex Vitamins (VITAMIN B COMPLEX PO) Take 1 tablet by mouth daily.     Marland Kitchen escitalopram (LEXAPRO) 10 MG tablet Take 1 tablet (10 mg total) by mouth at bedtime. 30 tablet 1  . furosemide (LASIX) 20 MG tablet Take 1 tablet ('20mg'$ ) by mouth daily. May take extra tablet for weight gain >3lbs 45 tablet 6  . ipratropium-albuterol (DUONEB) 0.5-2.5 (3) MG/3ML SOLN Take 3 mLs by nebulization every 6 (six) hours as needed.    . levETIRAcetam (KEPPRA) 500 MG tablet Take 1 tablet (500 mg total) by mouth 2 (two) times daily. 60 tablet 0  . Magnesium 250 MG TABS Take 1 tablet (250 mg total) by mouth daily.  0  . metoprolol  succinate (TOPROL-XL) 25 MG 24 hr tablet Take 0.5 tablets (12.5 mg total) by mouth daily. 30 tablet 1  . potassium chloride SA (K-DUR,KLOR-CON) 20 MEQ tablet Take 1 tablet (20 mEq total) by mouth daily. 30 tablet 1  . sulfamethoxazole-trimethoprim (BACTRIM DS,SEPTRA DS) 800-160 MG tablet Take 1 tablet by mouth every 12 (twelve) hours.      Home: Home Living Family/patient expects to be discharged to:: Inpatient rehab Living Arrangements: Spouse/significant other Available Help at Discharge: Family, Personal care attendant, Available 24 hours/day Type of Home: House Home Access: Stairs to enter [employer] of Steps: 3 Entrance Stairs-Rails: Right Home Layout: One level Bathroom Shower/Tub: Multimedia programmer: Standard Bathroom Accessibility: Yes Home Equipment: Wheelchair - manual, Environmental consultant - 2 wheels Additional Comments: wife can provide supervision level   Functional History: Prior Function Level of Independence: Needs assistance Gait / Transfers Assistance Needed: Used RW for gait short distances.  Progressively more weak requiring assist until unable to stand. ADL's / Homemaking Assistance Needed: Assist with ADL's Comments: Has 24 hour nursing assistance per chart, but reports that he was completing basic ADL independently.   Functional Status:  Mobility: Bed Mobility Overal bed mobility: Needs Assistance Bed  Mobility: Supine to Sit Rolling: Mod assist Supine to sit: Mod assist, +2 for physical assistance, HOB elevated Sit to sidelying: Max assist, +2 for physical assistance General bed mobility comments: max +2 for back to bed Transfers Overall transfer level: Needs assistance Equipment used: Rolling walker (2 wheeled) Transfers: Sit to/from Stand, Stand Pivot Transfers Sit to Stand: Max assist, +2 physical assistance, From elevated surface Stand pivot transfers: Max assist, +2 physical assistance General transfer comment: not attempted this  session Ambulation/Gait General Gait Details: unable to attempt due to poor transfer ability.    ADL: ADL Overall ADL's : Needs assistance/impaired Eating/Feeding: Set up, Sitting Grooming: Wash/dry face, Sitting, Min guard Grooming Details (indicate cue type and reason): Pt washed face after verbal cues for initiation and min assist for balance seated at EOB Upper Body Bathing: Minimal assistance, Sitting Lower Body Bathing: Sit to/from stand, Maximal assistance Upper Body Dressing : Minimal assistance, Sitting Lower Body Dressing: Maximal assistance, Sit to/from stand Toilet Transfer: Maximal assistance, +2 for safety/equipment, Stand-pivot, RW Toileting- Clothing Manipulation and Hygiene: Maximal assistance, Sit to/from stand Functional mobility during ADLs: Maximal assistance, +2 for physical assistance General ADL Comments: sat EOB x 5 minutes.  His back began to hurt so returned to supine.  Pt was unsteady sitting EOB.  Encouraged use of bil Ues for support.  He tends not to use L, although he has movement. When supine, he was able to fold a washcloth without dropping it, although it was not folded neatly.  Did not have him hold (thick shake); would benefit from lidded mug for regular drink.  BP 88/71 supine and 94/56 sitting up.  No c/o dizziness.    Cognition: Cognition Overall Cognitive Status: Within Functional Limits for tasks assessed Orientation Level: Oriented to person, Oriented to place, Oriented to situation, Disoriented to time Cognition Arousal/Alertness: Awake/alert Behavior During Therapy: WFL for tasks assessed/performed Overall Cognitive Status: Within Functional Limits for tasks assessed Area of Impairment: Attention, Problem solving, Following commands, Safety/judgement Orientation Level: Disoriented to, Place, Time, Situation Current Attention Level: Sustained Memory: Decreased short-term memory Following Commands: Follows one step commands with increased  time Safety/Judgement: Decreased awareness of safety, Decreased awareness of deficits Problem Solving: Slow processing, Decreased initiation, Difficulty sequencing, Requires verbal cues General Comments: difficulty describing feeling in LUE; visitor reports he has had decreased coordination  Physical Exam: Blood pressure (!) 93/51, pulse (!) 57, temperature 97.8 F (36.6 C), temperature source Oral, resp. rate 20, height 6' 2"  (1.88 m), weight 106 kg (233 lb 11 oz), SpO2 98 %. Physical Exam  Constitutional: He is oriented to person, place, and time. He appears well-developed.  Obese  HENT:  Head: Normocephalic. External ears normal. Eyes: Conjunctivae and EOM are normal. Right eye exhibits no discharge. Left eye exhibits no discharge.  Neck: Normal range of motion. Neck supple.  Cardiovascular:  Irregularly irregular  Respiratory: Effort normal and breath sounds normal.  +[place]  GI: Soft. Bowel sounds are normal.  Musculoskeletal: He exhibits edema. He exhibits no tenderness.  Neurological: He is alert and oriented to person, place, and time.  Motor: 4-/5 throughout Sensation intact to light touch  Skin: Skin is warm and dry.  Psychiatric: He has a normal mood and affect. His behavior is normal.    Results for orders placed or performed during the hospital encounter of 01/21/17 (from the past 48 hour(s))  Basic metabolic panel     Status: Abnormal   Collection Time: 01/31/17  5:39 AM  Result Value Ref Range  Sodium 138 135 - 145 mmol/L   Potassium 4.1 3.5 - 5.1 mmol/L   Chloride 104 101 - 111 mmol/L   CO2 29 22 - 32 mmol/L   Glucose, Bld 82 65 - 99 mg/dL   BUN 15 6 - 20 mg/dL   Creatinine, Ser 0.70 0.61 - 1.24 mg/dL   Calcium 7.7 (L) 8.9 - 10.3 mg/dL   GFR calc non Af Amer >60 >60 mL/min   GFR calc Af Amer >60 >60 mL/min    Comment: (NOTE) The eGFR has been calculated using the CKD EPI equation. This calculation has not been validated in all clinical situations. eGFR's  persistently <60 mL/min signify possible Chronic Kidney Disease.    Anion gap 5 5 - 15  CBC     Status: Abnormal   Collection Time: 01/31/17  5:39 AM  Result Value Ref Range   WBC 8.8 4.0 - 10.5 K/uL   RBC 3.26 (L) 4.22 - 5.81 MIL/uL   Hemoglobin 10.0 (L) 13.0 - 17.0 g/dL   HCT 31.6 (L) 39.0 - 52.0 %   MCV 96.9 78.0 - 100.0 fL   MCH 30.7 26.0 - 34.0 pg   MCHC 31.6 30.0 - 36.0 g/dL   RDW 15.8 (H) 11.5 - 15.5 %   Platelets 252 150 - 400 K/uL   No results found.   Medical Problem List and Plan: 1.  Debilitation secondary to sepsis as well as history of bilateral SDH status post craniotomy 12/16/2016 2.  DVT Prophylaxis/Anticoagulation: SCDs. Monitor for any signs of DVT 3. Pain Management: PRN oxycodone 4. Mood: Xanax 0.25 mg daily at bedtime, Lexapro 10 mg daily at bedtime 5. Neuropsych: This patient is capable of making decisions on his own behalf. 6. Skin/Wound Care: Routine skin checks 7. Fluids/Electrolytes/Nutrition: Routine I&O with follow-up chemistries 8. Seizure prophylaxis. Keppra 500 mg twice a day 9. ID/bacteremia. Continue Rocephin initiated 01/22/2017 10. COPD/OSA. Check oxygen saturations every shift 11. Atrial fibrillation/AVR/CABG/PPM. Continue low-dose aspirin as well as amiodarone 200 mg twice a day. Chronic Coumadin discontinued due to subdural hematomas 12. Diastolic congestive heart failure. Lasix 20 mg daily. Monitor for any signs of fluid overload 13. Hypertension. Currently with hypotension. Toprol-XL 12.5 mg daily, will holding presently 14. Hyperlipidemia. Lipitor 15. Prostatic abscess: S/p TURP. Cont abx 16. ABLA: Monitor CBC 17. LUE DVT: Brachial vein. Not an anticoagulation candidate due to recent subdural hematoma and craniotomy  Post Admission Physician Evaluation: 1. Preadmission assessment reviewed and changes made below. 2. Functional deficits secondary to debility. 3. Patient is admitted to receive collaborative, interdisciplinary care  between the physiatrist, rehab nursing staff, and therapy team. 4. Patient's level of medical complexity and substantial therapy needs in context of that medical necessity cannot be provided at a lesser intensity of care such as a SNF. 5. Patient has experienced substantial functional loss from his/her baseline which was documented above under the "Functional History" and "Functional Status" headings.  Judging by the patient's diagnosis, physical exam, and functional history, the patient has potential for functional progress which will result in measurable gains while on inpatient rehab.  These gains will be of substantial and practical use upon discharge  in facilitating mobility and self-care at the household level. 6. Physiatrist will provide 24 hour management of medical needs as well as oversight of the therapy plan/treatment and provide guidance as appropriate regarding the interaction of the two. 7. The Preadmission Screening has been reviewed and patient status is unchanged unless otherwise stated above. 8. 24 hour rehab  nursing will assist with bladder management, safety, skin/wound care, disease management, pain management and patient education  and help integrate therapy concepts, techniques,education, etc. 9. PT will assess and treat for/with: Lower extremity strength, range of motion, stamina, balance, functional mobility, safety, adaptive techniques and equipment, woundcare, coping skills, pain control, education.   Goals are: Min A. 10. OT will assess and treat for/with: ADL's, functional mobility, safety, upper extremity strength, adaptive techniques and equipment, wound mgt, ego support, and community reintegration.   Goals are: Min A. Therapy may proceed with showering this patient. 11. Case Management and Social Worker will assess and treat for psychological issues and discharge planning. 12. Team conference will be held weekly to assess progress toward goals and to determine barriers to  discharge. 13. Patient will receive at least 3 hours of therapy per day at least 5 days per week. 14. ELOS: 10-15 days.       15. Prognosis:  good  Delice Lesch, MD, Mellody Drown 02/01/17 Giacomo Valone Lorie Phenix, MD 02/01/2017

## 2017-02-01 NOTE — Progress Notes (Signed)
Patient arrived via [facility]. Vitals taken and patient settled into room. No complaints at this time. Family arrived later, familiar with Rehab unit. Discussed safety precautions. Patient resting with family, no distress.

## 2017-02-01 NOTE — Plan of Care (Signed)
Problem: RH Balance Goal: LTG Patient will maintain dynamic standing balance (PT) LTG:  Patient will maintain dynamic standing balance with assistance during mobility activities (PT) With LRAD  Problem: RH Car Transfers Goal: LTG Patient will perform car transfers with assist (PT) LTG: Patient will perform car transfers with assistance (PT). With LRAD  Problem: RH Furniture Transfers Goal: LTG Patient will perform furniture transfers w/assist (OT/PT LTG: Patient will perform furniture transfers  with assistance (OT/PT). With LRAD  Problem: RH Ambulation Goal: LTG Patient will ambulate in controlled environment (PT) LTG: Patient will ambulate in a controlled environment, # of feet with assistance (PT). 100 ft with LRAD Goal: LTG Patient will ambulate in home environment (PT) LTG: Patient will ambulate in home environment, # of feet with assistance (PT). 50 ft with LRAD  Problem: RH Wheelchair Mobility Goal: LTG Patient will propel w/c in controlled environment (PT) LTG: Patient will propel wheelchair in controlled environment, # of feet with assist (PT) 100 ft  Problem: RH Stairs Goal: LTG Patient will ambulate up and down stairs w/assist (PT) LTG: Patient will ambulate up and down # of stairs with assistance (PT) 3 steps with R rail for home access

## 2017-02-01 NOTE — Progress Notes (Signed)
Pt for transfer to the Rehab. [facility] arrived report given. Pt stable at time of transfer. Out of facility DNR sent with pt. Prior shift RN called report to [place].

## 2017-02-01 NOTE — Progress Notes (Signed)
Pt reports no longer SOB after tx.  Will continue to monitor.

## 2017-02-01 NOTE — Progress Notes (Signed)
Gave report to Alfredo Martinez RN at [facility] - pt going to [phone] at 0800.  States she will give report to oncoming RN who will call if she has any questions.

## 2017-02-01 NOTE — Progress Notes (Signed)
MD responded with Duoneb and Amy RT in to assess pt as well.

## 2017-02-01 NOTE — Progress Notes (Signed)
PT awoke at 0350 c SOB, Sats on 3l/m are 98%.  And rest of vss too.  No improvement in 20 mins.  No other distress.  Does not have any resp treatments and RT is busy in ED and can't come.  Pt does have known clots in L Arm and subdural hematoma.  Dr. Dahlia Bailiff on call and notified.  Awaiting response.

## 2017-02-01 NOTE — Evaluation (Signed)
Physical Therapy Assessment and Plan  Patient Details  Name: Alex [name] MRN: [id number] Date of Birth: December 28, 1930  PT Diagnosis: Abnormality of gait, Difficulty walking, Hemiplegia - L side,  Impaired cognition and Muscle weakness Rehab Potential: Fair ELOS: 3-4 weeks   Today's Date: 02/01/2017 PT Individual Time: 8299-3716 PT Individual Time Calculation (min): 55 min    Problem List:  Patient Active Problem List   Diagnosis Date Noted  . Debility 02/01/2017  . History of subdural hematoma   . Anxiety state   . Seizure prophylaxis   . Bacteremia   . Pacemaker   . Chronic diastolic congestive heart failure ([place])   . Benign essential HTN   . Hypotension   . Hyperlipidemia   . Prostatic abscess   . Acute blood loss anemia   . Acute deep vein thrombosis (DVT) of left upper extremity ([place])   . BPH with urinary obstruction: Per urology 01/29/2017 01/29/2017  . FUO (fever of unknown origin)   . Prostate abscess   . Fever   . HCAP (healthcare-associated pneumonia)   . Bacteremia due to Klebsiella pneumoniae   . Left arm swelling   . Transient hypotension 01/22/2017  . Sepsis ([place]) 01/21/2017  . Peripheral edema   . Bacterial UTI   . Urinary retention   . Hypertension   . Reactive hypertension   . Transaminitis   . Traumatic brain injury with loss of consciousness of 31 minutes to 59 minutes ([place]) 12/19/2016  . Acute pulmonary edema ([place]) 12/12/2016  . Acute diastolic CHF (congestive heart failure) ([place]) 12/12/2016  . Atrial flutter ([place]) 12/12/2016  . Traumatic subdural bleed with LOC of 1 hour to 5 hours 59 minutes ([place]) 12/11/2016  . Class 1 obesity due to excess calories with body mass index (BMI) of 30.0 to 30.9 in adult   . ETOH abuse   . Chronic obstructive pulmonary disease ([place])   . Supplemental oxygen dependent   . Alcohol use   . OSA on CPAP   . Atrial fibrillation ([place])   . Coronary artery disease involving coronary bypass graft of native heart without angina  pectoris   . Seizures ([place])   . Dysphagia   . Bradycardia   . Hyperglycemia   . Agitation   . Hypokalemia   . Hypernatremia   . Leukocytosis   . Macrocytic anemia   . Thrombocytopenia ([place])   . Traumatic subdural hematoma without loss of consciousness ([place])   . Change in mental status   . Intracranial hematoma ([place]) 12/03/2016  . Sick sinus syndrome ([place]) 11/06/2015  . PAF (paroxysmal atrial fibrillation) ([place]) 11/14/2014  . SOB (shortness of breath) 10/31/2014  . Persistent atrial fibrillation ([place]) 10/31/2014  . Aortic stenosis, severe 07/28/2011  . 3-vessel coronary artery disease 07/28/2011  . Hyperlipidemia, mixed 07/28/2011    Past Medical History:  Past Medical History:  Diagnosis Date  . Aortic stenosis    s/p AVR by Dr Cyndia Bent  . COPD (chronic obstructive pulmonary disease) ([place])   . History of coronary artery disease    status post stenting of the marginal circumflex in 12/2003 and again in 2009  . Hyperlipidemia   . Morbid obesity ([facility])    weight 243 pounds, BMI 31.2kg/m2, BSA 2.36 square meters  . Obstructive sleep apnea    compliant with CPAP  . Persistent atrial fibrillation [facility])    Past Surgical History:  Past Surgical History:  Procedure Laterality Date  . AORTIC VALVE REPLACEMENT (AVR)/CORONARY ARTERY BYPASS GRAFTING (CABG)  08/05/2011   LIMA to LAD, sequential saphenous vein graft to third and fourth obtuse marginal branches of the circumflex, aortic valve replacement using a 23 mm Edwards pericardial valve  . APPENDECTOMY    . CARDIOVERSION N/A 11/14/2014   Procedure: CARDIOVERSION;  Surgeon: Candee Furbish, MD;  Location[facility] END[facility]COPY;  Service: Cardiovascular;  Laterality: N/A;  . CARDIOVERSION N/A 11/20/2016   Procedure: CARDIOVERSION;  Surgeon: Fay Records, MD;  Location: [place];  Service: Cardiovascular;  Laterality: N/A;  . CAROTID ENDARTERECTOMY     Dr Levi Aland  . CRANIOTOMY Right 12/16/2016   Procedure: CRANIOTOMY HEMATOMA EVACUATION  SUBDURAL;  Surgeon: Kevan Ny Ditty, MD;  Location: [place];  Service: Neurosurgery;  Laterality: Right;  . EP IMPLANTABLE DEVICE N/A 11/06/2015   Procedure: PPM Generator Changeout;for sick sinus syndrome with a MDT Adapta L PPM, chronically elevated RV threshold.  . permanent pacemaker     MDT EnRhythm implanted by Dr Sherilyn Banker in [place] for complete heart block with syncope  . REPLACEMENT TOTAL KNEE BILATERAL     2006  . TEE WITHOUT CARDIOVERSION N/A 11/14/2014   Procedure: TRANSESOPHAGEAL ECHOCARDIOGRAM (TEE);  Surgeon: Candee Furbish, MD;  [facility] END[facility]COPY;  Service: Cardiovascular;  Laterality: N/A;  . TRANSURETHRAL INCISION OF PROSTATE N/A 01/29/2017   Procedure: TRANSURETHRAL UNROOFING OF A  PROSTATE ABSCESS;  Surgeon: Kathie Rhodes, MD;  Location: [facility] ORS;  Service: Urology;  Laterality: N/A;    Assessment & Plan Clinical Impression: Patient is a 83 y.o. year old male,right handed malewith history of morbid obesity, alcohol use, COPD with nighttime oxygen, atrial fibrillation with AVR, pacemaker as well as traumatic subdural hematoma status post right frontotemporoparietal craniotomy receiving inpatient rehabilitation services 12/19/2016 until 01/10/2017 and discharged to home with wife ambulating minimal assistance using assistive device as well as maintainedon a regular diet. Presented 01/21/2017 with decreased appetite, low-grade fever, hypotension and dry cough. Chest x-ray suspect by basilar infiltrates/bony. Possible aspiration.CT of the head with no acute abnormality since previous exam.Urine study negative nitrites. Lactic acid 1.98, WBC 7.4, BUN 19, creatinine 0.91. Maintain on sepsis protocol broad-spectrum antibiotics. IV fluids added to maintain blood pressure. Blood cultures grew out Klebsiella pneumoniae, urine culture Escherichia coli as well as Klebsiella. Cardiac rate remained controlled maintained on low-dose aspirin as prior to admission. He remains on a regular  diet.Request made for palliative care consult to discuss goals of care. Physical and occupational therapy evaluation completed  with recommendations of physical medicine rehabilitation consult. Patient was admitted for a comprehensive rehabilitation program. ID was consulted and left upper extremity dopplers were obtained due to swelling left upper extremity. CT abdomen and pelvis obtained was worrisome for prostatitis and prostatic abscess Urology was consulted and recommended unroofing of prostatic abscess. Procedure and TURP were done after transfer to [facility] hospital on 01/29/17. Is now afebrile and transitioned to PO antibiotics. PT/OT progress with recommendations for CIR. Patient is very motivated.  Patient transferred to CIR on 02/01/2017 .   Patient currently requires total with mobility secondary to muscle weakness, decreased cardiorespiratoy endurance and decreased oxygen support, decreased coordination, decreased initiation, decreased attention, decreased awareness, decreased problem solving, decreased safety awareness and decreased memory, and decreased sitting balance, decreased standing balance, decreased postural control and decreased balance strategies.  Prior to hospitalization, patient was supervision with mobility and lived with Spouse (wife [place]) in a House home.  Home access is 3Stairs to enter.  Patient will benefit from skilled PT intervention to maximize safe functional mobility, minimize fall  risk and decrease caregiver burden for planned discharge home with 24 hour assist.  Anticipate patient will benefit from follow up [place] at discharge.  PT - End of Session Activity Tolerance: Tolerates 30+ min activity with multiple rests Endurance Deficit: Yes Endurance Deficit Description:  (2/2 fatigue & cardiopulmonary deficits) PT Assessment Rehab Potential (ACUTE/IP ONLY): Fair PT Patient demonstrates impairments in the following area(s):  Balance;Behavior;Edema;Endurance;Motor;Pain;Perception;Sensory;Safety;Skin Integrity PT Transfers Functional Problem(s): Bed Mobility;Car;Bed to Chair;Furniture PT Locomotion Functional Problem(s): Wheelchair Mobility;Ambulation;Stairs PT Plan PT Intensity: Minimum of 1-2 x/day ,45 to 90 minutes PT Frequency: 5 out of 7 days PT Duration Estimated Length of Stay: 3-4 weeks PT Treatment/Interventions: Ambulation/gait training;Community reintegration;DME/adaptive equipment instruction;Neuromuscular re-education;Psychosocial support;Stair training;UE/LE Strength taining/ROM;Wheelchair propulsion/positioning;UE/LE Coordination activities;Therapeutic Activities;Skin care/wound management;Pain management;Discharge planning;Balance/vestibular training;Cognitive remediation/compensation;Disease management/prevention;Functional mobility training;Patient/family education;Therapeutic Exercise;Visual/perceptual remediation/compensation PT Transfers Anticipated Outcome(s): min assist with LRAD PT Locomotion Anticipated Outcome(s): min assist with LRAD PT Recommendation Recommendations for Other Services: Therapeutic Recreation consult Therapeutic Recreation Interventions: Pet therapy Follow Up Recommendations: Home health PT;24 hour supervision/assistance Patient destination: Home Equipment Recommended: To be determined  Skilled Therapeutic Intervention Pt received in bed & agreeable to tx. Pt denied c/o pain but reported significant fatigue. Educated pt on LUE DVT and need to minimally move extremity; pt continued to require frequent cuing throughout session. Pt transferred supine>sitting EOB with max assist and cuing for technique. Pt transferred bed>w/c via squat pivot with mutiple scoots, total assist, and cuing for sequencing/technique. Pt attempted to propel w/c with BLE only but unable to generate enough force to move chair without max assist. In gym pt attempted sit>stand with use of RUE on parallel bar  but pt unable to initiate movement nor clear buttocks from seat even with max assist. Pt required frequent, extended rest breaks 2/2 fatigue throughout session. Pt on 2L/min supplemental oxygen & SpO2 remained WNL. At end of session pt returned to room & left in w/c with QRB donned, all needs within reach, & set up with meal tray. Reviewed use of call bell with pt multiple times before therapist exited room. Also educated pt on ELOS and weekly interdisciplinary team meeting.   PT Evaluation Precautions/Restrictions Precautions Precautions: Fall Precaution Comments: monitor O2, pt has LUE DVT (brachial) & currently not candidate for anticoagulation, recent hx of traumatic R SDH Restrictions Weight Bearing Restrictions: No   General Chart Reviewed: Yes Additional Pertinent History: hx of seizures, depression, CABG, morbid obesity, COPD, alcohol use, a-fib with AVR, pacemaker, traumatic R SDH, aortic stenosis, hyperlipidemia, OSA, CAD, HTN, LUE DVT (brachial) PT Missed Treatment Reason: Not applicable Response to Previous Treatment: Patient reporting fatigue but able to participate. Family/Caregiver Present: No   Vital Signs Therapy Vitals Pulse Rate: 87 BP: (!) 101/52 (RUE) Patient Position (if appropriate): Lying Oxygen Therapy SpO2: 90 % O2 Device: Nasal Cannula O2 Flow Rate (L/min): 2 L/min Pulse Oximetry Type: Intermittent  Pain Pain Assessment Pain Assessment: No/denies pain  Home Living/Prior Functioning Home Living Living Arrangements: Spouse/significant other Available Help at Discharge: Family;Available 24 hours/day (can provide supervision level of assist) Type of Home: House Home Access: Stairs to enter [employer] of Steps: 3 Entrance Stairs-Rails: Right Home Layout: Two level;Able to live on main level with bedroom/bathroom (elevator to access basement)  Lives With: Spouse (wife [place]) Prior Function Level of Independence:  (pt recently d/c from CIR  at supervision level with RW)  Able to Take Stairs?: Yes Driving: No   Vision/Perception  Wears glasses for reading only. Vision needs to be assessed further in  functional context.   Cognition Overall Cognitive Status: History of cognitive impairments - at baseline (hx of cognitive impairements following R SDH) Arousal/Alertness: Awake/alert Orientation Level: Oriented to person;Oriented to place;Oriented to time Memory: Impaired Awareness: Impaired Awareness Impairment: Intellectual impairment Problem Solving: Impaired Safety/Judgment: Impaired  Glass blower/designer - Skilled Clinical Observations: general weakness   Mobility Bed Mobility Bed Mobility: Supine to Sit Supine to Sit: 2: Max assist;HOB elevated;With rails Supine to Sit Details: Tactile cues for initiation;Tactile cues for posture;Tactile cues for sequencing;Tactile cues for placement;Tactile cues for weight shifting;Manual facilitation for weight shifting;Manual facilitation for placement;Verbal cues for technique;Verbal cues for sequencing;Verbal cues for precautions/safety Transfers Transfers: Yes Sit to Stand:  (unable) Squat Pivot Transfers: 1: +1 Total assist;With upper extremity assistance Squat Pivot Transfer Details: Tactile cues for initiation;Tactile cues for posture;Tactile cues for sequencing;Tactile cues for placement;Tactile cues for weight shifting;Verbal cues for precautions/safety;Verbal cues for technique;Verbal cues for sequencing;Manual facilitation for weight shifting;Manual facilitation for placement  Locomotion  Ambulation Ambulation: No Gait Gait: No Stairs / Additional Locomotion Stairs: No Wheelchair Mobility Wheelchair Mobility: Yes Wheelchair Assistance: 2: Max Lexicographer: Both lower extermities Wheelchair Parts Management: Needs assistance Distance: 30 ft   Balance Balance Balance Assessed: Yes Static Sitting Balance Static Sitting - Balance Support: Right  upper extremity supported Static Sitting - Level of Assistance: 5: Stand by assistance  Extremity Assessment  RLE Assessment RLE Assessment:  (2/5 strength) LLE Assessment LLE Assessment:  (2/5 strength)   See Function Navigator for Current Functional Status.   Refer to Care Plan for Long Term Goals  Recommendations for other services: Therapeutic Recreation  Pet therapy  Discharge Criteria: Patient will be discharged from PT if patient refuses treatment 3 consecutive times without medical reason, if treatment goals not met, if there is a change in medical status, if patient makes no progress towards goals or if patient is discharged from hospital.  The above assessment, treatment plan, treatment alternatives and goals were discussed and mutually agreed upon: by patient  Waunita Schooner 02/01/2017, 6:17 PM

## 2017-02-01 NOTE — Evaluation (Signed)
Occupational Therapy Assessment and Plan  Patient Details  Name: Alex [name] MRN: [id number] Date of Birth: 1931/02/15  OT Diagnosis: apraxia, cognitive deficits and muscle weakness (generalized) Rehab Potential: Rehab Potential (ACUTE ONLY): Fair ELOS: 21-28 days   Today's Date: 02/01/2017 OT Individual Time: 1415-1530 OT Individual Time Calculation (min): 75 min     Problem List:  Patient Active Problem List   Diagnosis Date Noted  . Debility 02/01/2017  . History of subdural hematoma   . Anxiety state   . Seizure prophylaxis   . Bacteremia   . Pacemaker   . Chronic diastolic congestive heart failure ([place])   . Benign essential HTN   . Hypotension   . Hyperlipidemia   . Prostatic abscess   . Acute blood loss anemia   . Acute deep vein thrombosis (DVT) of left upper extremity ([place])   . BPH with urinary obstruction: Per urology 01/29/2017 01/29/2017  . FUO (fever of unknown origin)   . Prostate abscess   . Fever   . HCAP (healthcare-associated pneumonia)   . Bacteremia due to Klebsiella pneumoniae   . Left arm swelling   . Transient hypotension 01/22/2017  . Sepsis ([place]) 01/21/2017  . Peripheral edema   . Bacterial UTI   . Urinary retention   . Hypertension   . Reactive hypertension   . Transaminitis   . Traumatic brain injury with loss of consciousness of 31 minutes to 59 minutes ([place]) 12/19/2016  . Acute pulmonary edema ([place]) 12/12/2016  . Acute diastolic CHF (congestive heart failure) ([place]) 12/12/2016  . Atrial flutter ([place]) 12/12/2016  . Traumatic subdural bleed with LOC of 1 hour to 5 hours 59 minutes ([place]) 12/11/2016  . Class 1 obesity due to excess calories with body mass index (BMI) of 30.0 to 30.9 in adult   . ETOH abuse   . Chronic obstructive pulmonary disease ([place])   . Supplemental oxygen dependent   . Alcohol use   . OSA on CPAP   . Atrial fibrillation ([place])   . Coronary artery disease involving coronary bypass graft of native heart without angina  pectoris   . Seizures ([place])   . Dysphagia   . Bradycardia   . Hyperglycemia   . Agitation   . Hypokalemia   . Hypernatremia   . Leukocytosis   . Macrocytic anemia   . Thrombocytopenia ([place])   . Traumatic subdural hematoma without loss of consciousness ([place])   . Change in mental status   . Intracranial hematoma ([place]) 12/03/2016  . Sick sinus syndrome ([place]) 11/06/2015  . PAF (paroxysmal atrial fibrillation) ([place]) 11/14/2014  . SOB (shortness of breath) 10/31/2014  . Persistent atrial fibrillation ([place]) 10/31/2014  . Aortic stenosis, severe 07/28/2011  . 3-vessel coronary artery disease 07/28/2011  . Hyperlipidemia, mixed 07/28/2011    Past Medical History:  Past Medical History:  Diagnosis Date  . Aortic stenosis    s/p AVR by Dr Cyndia Bent  . COPD (chronic obstructive pulmonary disease) ([place])   . History of coronary artery disease    status post stenting of the marginal circumflex in 12/2003 and again in 2009  . Hyperlipidemia   . Morbid obesity ([facility])    weight 243 pounds, BMI 31.2kg/m2, BSA 2.36 square meters  . Obstructive sleep apnea    compliant with CPAP  . Persistent atrial fibrillation [facility])    Past Surgical History:  Past Surgical History:  Procedure Laterality Date  . AORTIC VALVE REPLACEMENT (AVR)/CORONARY ARTERY BYPASS GRAFTING (CABG)   08/05/2011  LIMA to LAD, sequential saphenous vein graft to third and fourth obtuse marginal branches of the circumflex, aortic valve replacement using a 23 mm Edwards pericardial valve  . APPENDECTOMY    . CARDIOVERSION N/A 11/14/2014   Procedure: CARDIOVERSION;  Surgeon: Candee Furbish, MD;  Location: [facility] ENDOSCOPY;  Service: Cardiovascular;  Laterality: N/A;  . CARDIOVERSION N/A 11/20/2016   Procedure: CARDIOVERSION;  Surgeon: Fay Records, MD;  Location: [place];  Service: Cardiovascular;  Laterality: N/A;  . CAROTID ENDARTERECTOMY     Dr Levi Aland  . CRANIOTOMY Right 12/16/2016   Procedure: CRANIOTOMY HEMATOMA EVACUATION  SUBDURAL;  Surgeon: Kevan Ny Ditty, MD;  Location: [place];  Service: Neurosurgery;  Laterality: Right;  . EP IMPLANTABLE DEVICE N/A 11/06/2015   Procedure: PPM Generator Changeout;for sick sinus syndrome with a MDT Adapta L PPM, chronically elevated RV threshold.  . permanent pacemaker     MDT EnRhythm implanted by Dr Sherilyn Banker in [place] for complete heart block with syncope  . REPLACEMENT TOTAL KNEE BILATERAL     2006  . TEE WITHOUT CARDIOVERSION N/A 11/14/2014   Procedure: TRANSESOPHAGEAL ECHOCARDIOGRAM (TEE);  Surgeon: Candee Furbish, MD;  [facility];  Service: Cardiovascular;  Laterality: N/A;  . TRANSURETHRAL INCISION OF PROSTATE N/A 01/29/2017   Procedure: TRANSURETHRAL UNROOFING OF A  PROSTATE ABSCESS;  Surgeon: Kathie Rhodes, MD;  Location: [facility] ORS;  Service: Urology;  Laterality: N/A;    Assessment & Plan Clinical Impression: Patient is a 83 y.o. right handed malewith history of morbid obesity, alcohol use, COPD with nighttime oxygen, atrial fibrillation with AVR, pacemaker as well as traumatic subdural hematoma status post right frontotemporoparietal craniotomy receiving inpatient rehabilitation services 12/19/2016 until 01/10/2017 and discharged to home with wife ambulating minimal assistance using assistive device as well as maintainedon a regular diet. Presented 01/21/2017 with decreased appetite, low-grade fever, hypotension and dry cough. Chest x-ray suspect by basilar infiltrates/bony. Possible aspiration.CT of the head reviewed, no acute abnormality.Urine study negative nitrites. Lactic acid 1.98, WBC 7.4, BUN 19, creatinine 0.91. Maintain on sepsis protocol broad-spectrum antibiotics. IV fluids added to maintain blood pressure. Blood cultures grew out Klebsiella pneumoniae, urine culture Escherichia coli as well as Klebsiella. Cardiac rate remained controlled maintained on low-dose aspirin as prior to admission. He remains on a regular diet.Request made for palliative  care consult to discuss goals of care. Physical and occupational therapy evaluation completed  with recommendations of physical medicine rehabilitation consult. Patient was admitted for a comprehensive rehabilitation program. ID was consulted and left upper extremity dopplers were obtained due to swelling left upper extremity. CT abdomen and pelvis obtained was worrisome for prostatitis and prostatic abscess Urology was consulted and recommended unroofing of prostatic abscess. Procedure and TURP were done after transfer to [facility] hospital on 01/29/17. Is now afebrile and transitioned to PO antibiotics.     Patient transferred to CIR on 02/01/2017.    Patient currently requires max with basic self-care skills secondary to decreased sitting balance, decreased postural control and decreased balance strategies.  Prior to hospitalization, patient could complete BADL with assistance (undefined at time of OT eval).  Patient will benefit from skilled intervention to decrease level of assist with basic self-care skills prior to discharge home with care partner.  Anticipate patient will require 24 hour supervision and follow up home health.  OT - End of Session Activity Tolerance: Tolerates 10 - 20 min activity with multiple rests Endurance Deficit: Yes OT Assessment Rehab Potential (ACUTE ONLY): Fair Barriers to Discharge: Decreased caregiver  support OT Patient demonstrates impairments in the following area(s): Cognition;Edema;Endurance;Motor;Balance;Safety OT Basic ADL's Functional Problem(s): Eating;Grooming;Bathing;Dressing;Toileting OT Transfers Functional Problem(s): Toilet;Tub/Shower OT Additional Impairment(s): Fuctional Use of Upper Extremity OT Plan OT Intensity: Minimum of 1-2 x/day, 45 to 90 minutes OT Frequency: 5 out of 7 days OT Duration/Estimated Length of Stay: 21-28 days OT Treatment/Interventions: Balance/vestibular training;Cognitive remediation/compensation;Discharge  planning;DME/adaptive equipment instruction;Functional mobility training;Patient/family education;Self Care/advanced ADL retraining;Therapeutic Activities;Therapeutic Exercise;Visual/perceptual remediation/compensation;UE/LE Strength taining/ROM;UE/LE Coordination activities OT Self Feeding Anticipated Outcome(s): Supervision OT Basic Self-Care Anticipated Outcome(s): Min A OT Toileting Anticipated Outcome(s): Supervision OT Bathroom Transfers Anticipated Outcome(s): Supervision OT Recommendation Patient destination: Home (or ALF/SNF??) Follow Up Recommendations: Skilled nursing facility (ALF/SNF recommended) Equipment Recommended: To be determined   Skilled Therapeutic Intervention OT initial evaluation completed with treatment provided to address self-feeding, transfers, sitting balance, management of edetamous LUE (d/t DVT), gross/FMC, cognitive impairment, and orientation to treatment/POC.   Pt very lethargic during eval, perseverating on self-feeding although with poor thoroughness.  Participation was poor during assessment d/t cognitive impairment, poor endurance and gross weakness.   Pt completed partial bed bath, RN notified on pressure/friction sore on buttocks.   Mechanical lift transfers recommended (maxi-move) as pt was unable to rise safely with +2 assist using Stedy or [employer] d/t BLE weakness and precautions relating to use of LUE.  OT Evaluation Precautions/Restrictions  Precautions Precautions: Fall Precaution Comments: monitor O2, pt has LUE DVT (brachial) & currently not candidate for anticoagulation, recent hx of traumatic R SDH Restrictions Weight Bearing Restrictions: No  General Chart Reviewed: Yes Response to Previous Treatment: Patient reporting fatigue but able to participate Family/Caregiver Present: Yes (son, initially, left with friend as session began)  Vital Signs Therapy Vitals Temp: 97.8 F (36.6 C) Temp Source: Oral Pulse Rate: 79 Resp: 17 BP:  114/60 Patient Position (if appropriate): Lying Oxygen Therapy SpO2: 94 % O2 Device: Nasal Cannula O2 Flow Rate (L/min): 2 L/min  Pain Pain Assessment Pain Assessment: No/denies pain  Home Living/Prior Functioning Home Living Living Arrangements: Spouse/significant other Available Help at Discharge: Family, Available 24 hours/day Type of Home: House Home Access: Stairs to enter Technical brewer of Steps: 3 Entrance Stairs-Rails: Right Home Layout: Two level, Able to live on main level with bedroom/bathroom Bathroom Shower/Tub: Multimedia programmer: Standard Bathroom Accessibility: Yes Additional Comments: wife can provide supervision level  Lives With: Spouse IADL History Homemaking Responsibilities: No Current License:  (Questionable accuracy for report of iADL) 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  Owns drywall business?  Questionable accuracy?) Comments: Has 24 hour nursing assistance per chart, but reports that he was completing basic ADL independently.   ADL ADL ADL Comments: See functional assessment tool  Vision/Perception  Vision- History Baseline Vision/History: Wears glasses Wears Glasses: Reading only Patient Visual Report: Eye fatigue/eye pain/headache Vision- Assessment Vision Assessment?: Vision impaired- to be further tested in functional context Ocular Range of Motion: Within Functional Limits Praxis Praxis-Other Comments: Suspiciously apraxic although very fatigued at time of eval.   Please investigate further.   Cognition Overall Cognitive Status: History of cognitive impairments - at baseline Arousal/Alertness: Lethargic Orientation Level: Person Year: 2018 Month: March (incorrect response) Day of Week: Incorrect Memory: Impaired Memory Impairment: Decreased  recall of new information Immediate Memory Recall: Bed Memory Recall:  (unable (0/3)) Attention: Sustained;Focused Focused Attention: Appears intact Sustained Attention Impairment: Verbal basic;Functional basic Selective  Attention: Impaired Selective Attention Impairment: Verbal basic;Functional basic Awareness: Impaired Awareness Impairment: Intellectual impairment Problem Solving: Impaired Problem Solving Impairment: Functional basic;Functional complex Executive Function: Organizing;Initiating;Sequencing Sequencing: Impaired Sequencing Impairment: Functional basic Organizing: Impaired Organizing Impairment: Functional basic Initiating: Impaired Initiating Impairment: Functional basic Behaviors: Other (comment) (complicated by lethargy) Safety/Judgment: Impaired Comments: Very lethargic during OT eval; perseverated on finishing late lunch with poor thoroughness (food spills on gown)  Sensation Sensation Light Touch: Impaired by gross assessment Light Touch Impaired Details: Impaired LUE Stereognosis: Impaired by gross assessment Hot/Cold: Appears Intact Proprioception: Impaired Detail Proprioception Impaired Details: Impaired LUE Additional Comments: LUE impaired d/t edema; precautions relating to DVT undefined at time of eval Coordination Gross Motor Movements are Fluid and Coordinated: No Fine Motor Movements are Fluid and Coordinated: No Coordination and Movement Description: decreased coordination BUE  Motor  Motor Motor: Motor apraxia Motor - Skilled Clinical Observations: general weakness  Mobility  Bed Mobility Bed Mobility: Supine to Sit Supine to Sit: 2: Max assist;HOB elevated;With rails Supine to Sit Details: Tactile cues for initiation;Tactile cues for posture;Tactile cues for sequencing;Tactile cues for placement;Tactile cues for weight shifting;Manual facilitation for weight shifting;Manual facilitation for placement;Verbal cues for technique;Verbal cues  for sequencing;Verbal cues for precautions/safety Transfers Transfers:  (Mechanical assist required during OT eval) Sit to Stand: 1: +2 Total assist Sit to Stand: Patient Percentage: 20% Sit to Stand Details (indicate cue type and reason): Pt able to transfer to recliner with bed elevated and Max A X 1 but unable to rise again from recliner using Stedy or [employer]  Maxi-move required to return to bed. Stand to Sit: 2: Max assist;1: +2 Total assist Stand to Sit Details: Pt able to transfer to recliner with bed elevated and Max A X 1 but unable to rise again from recliner using Stedy or [employer]  Maxi-move required to return to bed.   Trunk/Postural Assessment  Cervical Assessment Cervical Assessment: Within Functional Limits Thoracic Assessment Thoracic Assessment: Within Functional Limits Lumbar Assessment Lumbar Assessment: Within Functional Limits Postural Control Postural Control: Deficits on evaluation Trunk Control: Poor response to cues during use of mechanical lift equipment Righting Reactions: delayed Protective Responses: delayed   Balance Balance Balance Assessed: Yes Static Sitting Balance Static Sitting - Balance Support: Right upper extremity supported Static Sitting - Level of Assistance: 5: Stand by assistance Dynamic Sitting Balance Dynamic Sitting - Balance Support: Feet supported;Left upper extremity supported Dynamic Sitting - Level of Assistance: 3: Mod assist Sitting balance - Comments: pt wobbly in all directions  Extremity/Trunk Assessment RUE Assessment RUE Assessment: Exceptions to [facility] RUE Strength RUE Overall Strength: Deficits;Due to impaired cognition LUE Assessment LUE Assessment: Exceptions to [facility] LUE Strength LUE Overall Strength: Deficits;Due to precautions;Due to impaired cognition LUE Overall Strength Comments: Unable to assess d/t precaution status unknown (hx of DVT)   See Function Navigator for Current Functional  Status.   Refer to Care Plan for Long Term Goals  Recommendations for other services: None    Discharge Criteria: Patient will be discharged from OT if patient refuses treatment 3 consecutive times without medical reason, if treatment goals not met, if there is a change in medical status, if patient makes no progress towards goals or if patient is discharged from hospital.  The above assessment, treatment plan, treatment alternatives and goals were discussed and mutually agreed upon: by patient  [facility] 02/02/2017, 5:38 AM

## 2017-02-02 ENCOUNTER — Inpatient Hospital Stay (HOSPITAL_COMMUNITY): Payer: Medicare Other | Admitting: Physical Therapy

## 2017-02-02 ENCOUNTER — Inpatient Hospital Stay (HOSPITAL_COMMUNITY): Payer: Medicare Other

## 2017-02-02 DIAGNOSIS — R5381 Other malaise: Secondary | ICD-10-CM

## 2017-02-02 LAB — BASIC METABOLIC PANEL
Anion gap: 9 (ref 5–15)
BUN: 12 mg/dL (ref 6–20)
CO2: 27 mmol/L (ref 22–32)
Calcium: 7.9 mg/dL — ABNORMAL LOW (ref 8.9–10.3)
Chloride: 102 mmol/L (ref 101–111)
Creatinine, Ser: 0.75 mg/dL (ref 0.61–1.24)
GFR calc Af Amer: >60 mL/min (ref >60)
GLUCOSE: 85 mg/dL (ref 65–99)
POTASSIUM: 3.9 mmol/L (ref 3.5–5.1)
Sodium: 138 mmol/L (ref 135–145)

## 2017-02-02 LAB — CBC WITH DIFFERENTIAL/PLATELET
HEMATOCRIT: 32.9 % — AB (ref 39.0–52.0)
HEMOGLOBIN: 10.5 g/dL — AB (ref 13.0–17.0)
MCH: 30.8 pg (ref 26.0–34.0)
MCHC: 31.9 g/dL (ref 30.0–36.0)
MCV: 96.5 fL (ref 78.0–100.0)
PLATELETS: 253 10*3/uL (ref 150–400)
RBC: 3.41 MIL/uL — ABNORMAL LOW (ref 4.22–5.81)
RDW: 16.1 % — ABNORMAL HIGH (ref 11.5–15.5)
WBC: 9.9 10*3/uL (ref 4.0–10.5)

## 2017-02-02 MED ORDER — ALUM & MAG HYDROXIDE-SIMETH 200-200-20 MG/5ML PO SUSP: 30.0000 mL | ORAL | Status: DC | PRN

## 2017-02-02 MED ORDER — ENOXAPARIN SODIUM 40 MG/0.4ML ~~LOC~~ SOLN
40.0000 mg | SUBCUTANEOUS | Status: DC
  Administered 2017-02-02 – 2017-02-18 (×18): 40 mg via SUBCUTANEOUS
  Filled 2017-02-02 (×18): qty 0.4

## 2017-02-02 MED ORDER — BISACODYL 10 MG RE SUPP
10.0000 mg | Freq: Every day | RECTAL | Status: DC | PRN
  Administered 2017-02-02 – 2017-02-11 (×2): 10 mg via RECTAL
  Filled 2017-02-02 (×2): qty 1

## 2017-02-02 MED ORDER — FLEET ENEMA 7-19 GM/118ML RE ENEM
1.0000 | ENEMA | Freq: Once | RECTAL | Status: AC | PRN
  Administered 2017-02-12: 1 via RECTAL
  Filled 2017-02-02 (×2): qty 1

## 2017-02-02 MED ORDER — HYDROCERIN EX CREA
TOPICAL_CREAM | Freq: Two times a day (BID) | CUTANEOUS | Status: DC
  Administered 2017-02-02 – 2017-02-04 (×5): via TOPICAL
  Administered 2017-02-05: 1 via TOPICAL
  Administered 2017-02-05: 09:00:00 via TOPICAL
  Administered 2017-02-06: 1 via TOPICAL
  Administered 2017-02-06 – 2017-02-10 (×8): via TOPICAL
  Administered 2017-02-10: 1 via TOPICAL
  Administered 2017-02-11 – 2017-02-17 (×12): via TOPICAL
  Filled 2017-02-02: qty 113

## 2017-02-02 MED ORDER — TRAZODONE HCL 50 MG PO TABS
25.0000 mg | ORAL_TABLET | Freq: Every evening | ORAL | Status: DC | PRN
  Administered 2017-02-06 – 2017-02-17 (×6): 50 mg via ORAL
  Filled 2017-02-02 (×6): qty 1

## 2017-02-02 MED ORDER — DIPHENHYDRAMINE HCL 12.5 MG/5ML PO ELIX: 12.5000 mg | ORAL_SOLUTION | Freq: Four times a day (QID) | ORAL | Status: DC | PRN

## 2017-02-02 MED ORDER — PROCHLORPERAZINE EDISYLATE 5 MG/ML IJ SOLN: 5.0000 mg | Freq: Four times a day (QID) | INTRAMUSCULAR | Status: DC | PRN

## 2017-02-02 MED ORDER — GUAIFENESIN-DM 100-10 MG/5ML PO SYRP: 5.0000 mL | ORAL_SOLUTION | Freq: Four times a day (QID) | ORAL | Status: DC | PRN

## 2017-02-02 MED ORDER — ACETAMINOPHEN 325 MG PO TABS
325.0000 mg | ORAL_TABLET | ORAL | Status: DC | PRN
  Filled 2017-02-02: qty 2

## 2017-02-02 MED ORDER — PROCHLORPERAZINE MALEATE 5 MG PO TABS: 5.0000 mg | ORAL_TABLET | Freq: Four times a day (QID) | ORAL | Status: DC | PRN

## 2017-02-02 MED ORDER — POLYETHYLENE GLYCOL 3350 17 G PO PACK
17.0000 g | PACK | Freq: Every day | ORAL | Status: DC | PRN
  Administered 2017-02-02 – 2017-02-11 (×6): 17 g via ORAL
  Filled 2017-02-02 (×6): qty 1

## 2017-02-02 MED ORDER — PROCHLORPERAZINE 25 MG RE SUPP: 12.5000 mg | Freq: Four times a day (QID) | RECTAL | Status: DC | PRN

## 2017-02-02 NOTE — Progress Notes (Signed)
Social Work  Social Work Assessment and Plan  Patient Details  Name: Alex [name] MRN: [id number] Date of Birth: Aug 09, 1931  Today's Date: 02/02/2017  Problem List:  Patient Active Problem List   Diagnosis Date Noted  . Physical debility 02/02/2017  . Debility 02/01/2017  . History of subdural hematoma   . Anxiety state   . Seizure prophylaxis   . Bacteremia   . Pacemaker   . Chronic diastolic congestive heart failure (HCC)   . Benign essential HTN   . Hypotension   . Hyperlipidemia   . Prostatic abscess   . Acute blood loss anemia   . Acute deep vein thrombosis (DVT) of left upper extremity (HCC)   . BPH with urinary obstruction: Per urology 01/29/2017 01/29/2017  . FUO (fever of unknown origin)   . Prostate abscess   . Fever   . HCAP (healthcare-associated pneumonia)   . Bacteremia due to Klebsiella pneumoniae   . Left arm swelling   . Transient hypotension 01/22/2017  . Sepsis (HCC) 01/21/2017  . Peripheral edema   . Bacterial UTI   . Urinary retention   . Hypertension   . Reactive hypertension   . Transaminitis   . Traumatic brain injury with loss of consciousness of 31 minutes to 59 minutes (HCC) 12/19/2016  . Acute pulmonary edema (HCC) 12/12/2016  . Acute diastolic CHF (congestive heart failure) (HCC) 12/12/2016  . Atrial flutter (HCC) 12/12/2016  . Traumatic subdural bleed with LOC of 1 hour to 5 hours 59 minutes (HCC) 12/11/2016  . Class 1 obesity due to excess calories with body mass index (BMI) of 30.0 to 30.9 in adult   . ETOH abuse   . Chronic obstructive pulmonary disease (HCC)   . Supplemental oxygen dependent   . Alcohol use   . OSA on CPAP   . Atrial fibrillation (HCC)   . Coronary artery disease involving coronary bypass graft of native heart without angina pectoris   . Seizures (HCC)   . Dysphagia   . Bradycardia   . Hyperglycemia   . Agitation   . Hypokalemia   . Hypernatremia   . Leukocytosis   . Macrocytic anemia   . Thrombocytopenia  (HCC)   . Traumatic subdural hematoma without loss of consciousness (HCC)   . Change in mental status   . Intracranial hematoma (HCC) 12/03/2016  . Sick sinus syndrome (HCC) 11/06/2015  . PAF (paroxysmal atrial fibrillation) (HCC) 11/14/2014  . SOB (shortness of breath) 10/31/2014  . Persistent atrial fibrillation (HCC) 10/31/2014  . Aortic stenosis, severe 07/28/2011  . 3-vessel coronary artery disease 07/28/2011  . Hyperlipidemia, mixed 07/28/2011   Past Medical History:  Past Medical History:  Diagnosis Date  . Aortic stenosis    s/p AVR by Dr Laneta Simmers  . COPD (chronic obstructive pulmonary disease) (HCC)   . History of coronary artery disease    status post stenting of the marginal circumflex in 12/2003 and again in 2009  . Hyperlipidemia   . Morbid obesity (HCC)    weight 243 pounds, BMI 31.2kg/m2, BSA 2.36 square meters  . Obstructive sleep apnea    compliant with CPAP  . Persistent atrial fibrillation [facility])    Past Surgical History:  Past Surgical History:  Procedure Laterality Date  . AORTIC VALVE REPLACEMENT (AVR)/CORONARY ARTERY BYPASS GRAFTING (CABG)   08/05/2011   LIMA to LAD, sequential saphenous vein graft to third and fourth obtuse marginal branches of the circumflex, aortic valve replacement using a 23 mm Edwards pericardial  valve  . APPENDECTOMY    . CARDIOVERSION N/A 11/14/2014   Procedure: CARDIOVERSION;  Surgeon: Donato Schultz, MD;  Location: [facility] ENDOSCOPY;  Service: Cardiovascular;  Laterality: N/A;  . CARDIOVERSION N/A 11/20/2016   Procedure: CARDIOVERSION;  Surgeon: Pricilla Riffle, MD;  Location: [facility] ENDOSCOPY;  Service: Cardiovascular;  Laterality: N/A;  . CAROTID ENDARTERECTOMY     Dr Lollie Sails  . CRANIOTOMY Right 12/16/2016   Procedure: CRANIOTOMY HEMATOMA EVACUATION SUBDURAL;  Surgeon: Loura Halt Ditty, MD;  Location: [facility] OR;  Service: Neurosurgery;  Laterality: Right;  . EP IMPLANTABLE DEVICE N/A 11/06/2015   Procedure: PPM Generator Changeout;for sick  sinus syndrome with a MDT Adapta L PPM, chronically elevated RV threshold.  . permanent pacemaker     MDT EnRhythm implanted by Dr Lawanda Cousins in [place] for complete heart block with syncope  . REPLACEMENT TOTAL KNEE BILATERAL     2006  . TEE WITHOUT CARDIOVERSION N/A 11/14/2014   Procedure: TRANSESOPHAGEAL ECHOCARDIOGRAM (TEE);  Surgeon: Donato Schultz, MD;  Location: [facility] ENDOSCOPY;  Service: Cardiovascular;  Laterality: N/A;  . TRANSURETHRAL INCISION OF PROSTATE N/A 01/29/2017   Procedure: TRANSURETHRAL UNROOFING OF A  PROSTATE ABSCESS;  Surgeon: Ihor Gully, MD;  Location: [facility] ORS;  Service: Urology;  Laterality: N/A;   Social History:  reports that he quit smoking about 13 years ago. His smoking use included Cigarettes. He started smoking about 43 years ago. He has a 15.00 pack-year smoking history. He has never used smokeless tobacco. He reports that he drinks alcohol. He reports that he does not use drugs.  Family / Support Systems Marital Status: Married How Long?: 50 yrs (2nd marriage for both) Patient Roles: Spouse, Parent, Other (Comment) (and "adopted daughter") Spouse/Significant Other: wife, [place] Eplin @ (C) [phone] or [phone] Children: son, Selvin Yun @ [phone] Other Supports: "adopted daughter", Haynes Bast @ [phone] Anticipated Caregiver: wife, hired caregivers Ability/Limitations of Caregiver: Wife can provide supervision.  Patient has 24/7 caregivers at home. Caregiver Availability: 24/7 Family Dynamics: Wife and son very supportive as well as friend/ "adopted daughter", French Ana.  They have been very involved in pt's care since 1st CIR admit.  Social History Preferred language: English Religion: [employer] Cultural Background: NA Read: Yes Write: Yes Employment Status: Retired Fish farm manager Issues: None Guardian/Conservator: None - per MD, pt is capable of making decisions on his own behalf.   Abuse/Neglect Physical Abuse: Denies Verbal  Abuse: Denies Sexual Abuse: Denies Exploitation of patient/patient's resources: Denies Self-Neglect: Denies  Emotional Status Pt's affect, behavior adn adjustment status: Pt able to engage in assessment interview and answers general, personal information correctly. He is familiar with staff as he was recently here on CIR.  He admits frustration with ongoing health issues but denying any emotional distress.  Family concerned that he may not have the motivation to continue to fight for recovery and interested in a neuropsychology consult during his CIR stay. Recent Psychosocial Issues: Recent hospitalization Pyschiatric History: None Substance Abuse History: Wife and friend note significant ETOH use prior to SDH.  Patient / Family Perceptions, Expectations & Goals Pt/Family understanding of illness & functional limitations: Pt and wife with basic understanding of his functional decline due to PNA and need for CIR readmit. Premorbid pt/family roles/activities: Pt was requiring 24/7 physical assist at home since prior CIR.  Family and private duty caregivers. Anticipated changes in roles/activities/participation: Anticipate pt will continue to require 24/7 assistance after this d/c. Pt/family expectations/goals: "I need to get better."  [facility]  Community Agencies: None Premorbid Home Care/DME Agencies: Other (Comment) (HH via [facility];  Private duty CNA via [employer]) Transportation available at discharge: yes Resource referrals recommended: Neuropsychology  Discharge Planning Living Arrangements: Spouse/significant other Support Systems: Spouse/significant other, Children, Home care staff Type of Residence: Private residence Insurance Resources: [employer] Financial Resources: Social Security Financial Screen Referred: No Living Expenses: Own Money Management: Spouse Does the patient have any problems obtaining your medications?: No Home Management: wife and  private duty assist Patient/Family Preliminary Plans: Plan for pt to return home with his wife and private duty caregivers. Social Work Anticipated Follow Up Needs: HH/OP Expected length of stay: 3-4 weeks  Clinical Impression Elderly gentleman returning to CIR after stay less than a month ago (SDH).  Here now for debility following PNA.  Pt and family familiar with program and staff and pleased he was accepted back.  Wife and family hopeful that he will benefit from the program and continue to work on rebuilding his strength when her returns home again.  They had arranged 24/7 caregivers after prior d/c and plan to continue these.  Will follow for support and likely refer to neuropsychology for additional support.  Leilan Bochenek 02/02/2017, 3:25 PM

## 2017-02-02 NOTE — Progress Notes (Signed)
Patient information reviewed and entered into eRehab system by Elzabeth Mcquerry, RN, CRRN, PPS Coordinator.  Information including medical coding and functional independence measure will be reviewed and updated through discharge.     Per nursing patient was given "Data Collection Information Summary for Patients in Inpatient Rehabilitation Facilities with attached "Privacy Act Statement-Health Care Records" upon admission.  

## 2017-02-02 NOTE — Care Management Note (Signed)
Inpatient Rehabilitation Center Individual Statement of Services  Patient Name:  ARHAM SYMMONDS  Date:  02/02/2017  Welcome to the Inpatient Rehabilitation Center.  Our goal is to provide you with an individualized program based on your diagnosis and situation, designed to meet your specific needs.  With this comprehensive rehabilitation program, you will be expected to participate in at least 3 hours of rehabilitation therapies Monday-Friday, with modified therapy programming on the weekends.  Your rehabilitation program will include the following services:  Physical Therapy (PT), Occupational Therapy (OT), 24 hour per day rehabilitation nursing, Therapeutic Recreaction (TR), Neuropsychology, Case Management (Social Worker), Rehabilitation Medicine, Nutrition Services and Pharmacy Services  Weekly team conferences will be held on Tuesdays to discuss your progress.  Your Social Worker will talk with you frequently to get your input and to update you on team discussions.  Team conferences with you and your family in attendance may also be held.  Expected length of stay: 3-4 weeks  Overall anticipated outcome: minimal assistance  Depending on your progress and recovery, your program may change. Your Social Worker will coordinate services and will keep you informed of any changes. Your Social Worker's name and contact numbers are listed  below.  The following services may also be recommended but are not provided by the Inpatient Rehabilitation Center:   Driving Evaluations  Home Health Rehabiltiation Services  Outpatient Rehabilitation Services    Arrangements will be made to provide these services after discharge if needed.  Arrangements include referral to agencies that provide these services.  Your insurance has been verified to be:  [facility] Medicare Your primary doctor is:  Dr. Alben Spittle  Pertinent information will be shared with your doctor and your insurance company.  Social Worker:  [place], [place] [phone] or [phone]   Information discussed with and copy given to patient by: Amada Jupiter, 02/02/2017, 1:36 PM

## 2017-02-02 NOTE — Progress Notes (Addendum)
Physical Therapy Session Note  Patient Details  Name: Alex [name] MRN: [id number] Date of Birth: [birth year]  Today's Date: 02/02/2017 PT Individual Time: 0900-1023 PT Individual Time Calculation (min): 83 min   Short Term Goals: Week 1:  PT Short Term Goal 1 (Week 1): Pt will perform bed mobility with mod assist +1. PT Short Term Goal 2 (Week 1): Pt will transfer sit<>stand with max assist +1. PT Short Term Goal 3 (Week 1): Pt will transfer bed<>w/c with max assist +1.  Skilled Therapeutic Interventions/Progress Updates:    no c/o pain.  LUE noted to have weeping edema and warm to touch, but no pain reported with touch or movement, RN aware and instructions for guarded movement in that UE.    Session focus on activity tolerance via bed mobility, transfers, unsupported and supported sitting, bathing, and self feeding.  Pt transitions supine>sit with HOB elevated, using bed rails and mod assist to elevate trunk.  Static sitting EOB x5 minutes with supervision>steady assist before pt became fatigued and requesting to recline/rest.  Attempted sit<>stand from elevated bed but pt unable to come to full standing even with total assist.  Total +2 for squat/pivot EOB to w/c, pt unable or or too fearful of falling to assist.  HR up to 128 and O2 down to 85% on 2L with transfer, return to WNL within 1 min of seated rest break.  Pt able to attend to self feeding task x5 minutes until full using RUE.  Activity tolerance in supported sitting for bathing at sink level.  Pt requires mod cues and max assist and multiple rest breaks for bathing upper body only.  Positioned in w/c with +2 assist to scoot, QRB in place, call bell in reach and needs met.   Therapy Documentation Precautions:  Precautions Precautions: Fall Precaution Comments: monitor O2, pt has LUE DVT (brachial) & currently not candidate for anticoagulation, recent hx of traumatic R SDH Restrictions Weight Bearing Restrictions: No   See  Function Navigator for Current Functional Status.   Therapy/Group: Individual Therapy  Mansoor Hillyard E Penven-Crew 02/02/2017, 10:32 AM

## 2017-02-02 NOTE — Progress Notes (Signed)
Alex Oyster, MD Physician Signed Physical Medicine and Rehabilitation  Consult Note Date of Service: 01/26/2017 6:00 AM  Related encounter: ED to Hosp-Admission (Discharged) from 01/21/2017 in [facility] TELEMETRY/UROLOGY [facility]     Expand All Collapse All   Hide copied text Hover for attribution information      Physical Medicine and Rehabilitation Consult Reason for Consult: Debilitation with decreased functional mobility Referring Physician: [facility]   HPI: Alex [name] is a 83 y.o. 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 Presented 01/21/2017 with decreased appetite, low-grade fever, hypotension and dry cough. Chest x-ray suspect by basilar infiltrates/bony. Possible aspiration. CT of the head with no acute abnormality since previous exam. Urine study negative nitrites. Lactic acid 1.98, WBC 7.4, BUN 19, creatinine 0.91. Maintain on sepsis protocol broad-spectrum antibiotics. IV fluids added to maintain blood pressure. Blood cultures grew out Klebsiella pneumoniae, urine culture Escherichia coli as well as Klebsiella. Cardiac rate remained controlled maintained on low-dose aspirin as prior to admission. He remains on a regular diet. Physical therapy evaluation completed 01/23/2017 with recommendations of physical medicine rehabilitation consult.   Review of Systems  Constitutional: Positive for fever.       Lack of appetite  HENT: Negative for hearing loss and tinnitus.   Eyes: Negative for blurred vision and double vision.  Respiratory: Positive for cough. Negative for shortness of  breath.   Cardiovascular: Positive for palpitations and leg swelling. Negative for chest pain.  Gastrointestinal: Positive for constipation. Negative for nausea and vomiting.  Genitourinary: Positive for frequency and urgency. Negative for dysuria and hematuria.  Musculoskeletal: Positive for falls and myalgias.  Skin: Negative for rash.  Neurological: Positive for weakness. Negative for seizures.  All other systems reviewed and are negative.      Past Medical History:  Diagnosis Date  . Aortic stenosis    s/p AVR by Dr Laneta Simmers  . COPD (chronic obstructive pulmonary disease) (HCC)   . History of coronary artery disease    status post stenting of the marginal circumflex in 12/2003 and again in 2009  . Hyperlipidemia   . Morbid obesity (HCC)    weight 243 pounds, BMI 31.2kg/m2, BSA 2.36 square meters  . Obstructive sleep apnea    compliant with CPAP  . Persistent atrial fibrillation [facility])         Past Surgical History:  Procedure Laterality Date  . AORTIC VALVE REPLACEMENT (AVR)/CORONARY ARTERY BYPASS GRAFTING (CABG)   08/05/2011   LIMA to LAD, sequential saphenous vein graft to third and fourth obtuse marginal branches of the circumflex, aortic valve replacement using a 23 mm Edwards pericardial valve  . APPENDECTOMY    . CARDIOVERSION N/A 11/14/2014   Procedure: CARDIOVERSION;  Surgeon: Donato Schultz, MD;  Location: [facility] ENDOSCOPY;  Service: Cardiovascular;  Laterality: N/A;  . CARDIOVERSION N/A 11/20/2016   Procedure: CARDIOVERSION;  Surgeon: Pricilla Riffle, MD;  Location: [facility] ENDOSCOPY;  Service: Cardiovascular;  Laterality: N/A;  . CAROTID ENDARTERECTOMY     Dr Lollie Sails  . CRANIOTOMY Right 12/16/2016   Procedure: CRANIOTOMY HEMATOMA EVACUATION SUBDURAL;  Surgeon: Loura Halt Ditty, MD;  Location: [facility] OR;  Service: Neurosurgery;  Laterality: Right;  . EP IMPLANTABLE DEVICE N/A  11/06/2015   Procedure: PPM Generator Changeout;for sick sinus syndrome with a MDT  Adapta L PPM, chronically elevated RV threshold.  . permanent pacemaker     MDT EnRhythm implanted by Dr Lawanda Cousins in [place] for complete heart block with syncope  . REPLACEMENT TOTAL KNEE BILATERAL     2006  . TEE WITHOUT CARDIOVERSION N/A 11/14/2014   Procedure: TRANSESOPHAGEAL ECHOCARDIOGRAM (TEE);  Surgeon: Donato Schultz, MD;  Location: [facility] ENDOSCOPY;  Service: Cardiovascular;  Laterality: N/A;         Family History  Problem Relation Age of Onset  . Alzheimer's disease Mother   . Tuberculosis Father   . Other Father     Spinal Meningitis   Social History:  reports that he quit smoking about 13 years ago. His smoking use included Cigarettes. He started smoking about 43 years ago. He has a 15.00 pack-year smoking history. He has never used smokeless tobacco. He reports that he drinks alcohol. He reports that he does not use drugs. Allergies: No Known Allergies       Medications Prior to Admission  Medication Sig Dispense Refill  . acetaminophen (TYLENOL) 500 MG tablet Take 1,000 mg by mouth every 6 (six) hours as needed.    . ALPRAZolam (XANAX) 0.25 MG tablet Take 1 tablet (0.25 mg total) by mouth at bedtime. 30 tablet 0  . amiodarone (PACERONE) 200 MG tablet Take 1 tablet (200 mg total) by mouth 2 (two) times daily. 60 tablet 1  . aspirin 81 MG tablet Take 81 mg by mouth daily.    Marland Kitchen atorvastatin (LIPITOR) 40 MG tablet Take 1 tablet (40 mg total) by mouth daily. 30 tablet 1  . B Complex Vitamins (VITAMIN B COMPLEX PO) Take 1 tablet by mouth daily.     Marland Kitchen escitalopram (LEXAPRO) 10 MG tablet Take 1 tablet (10 mg total) by mouth at bedtime. 30 tablet 1  . furosemide (LASIX) 20 MG tablet Take 1 tablet ( ) by mouth daily. May take extra tablet for weight gain >3lbs 45 tablet 6  . levETIRAcetam (KEPPRA) 500 MG tablet Take 1 tablet (500 mg total) by mouth 2 (two) times daily. 60 tablet 0  . Magnesium 250 MG TABS Take 1 tablet (250 mg total) by mouth daily.  0  .  metoprolol succinate (TOPROL-XL) 25 MG 24 hr tablet Take 0.5 tablets (12.5 mg total) by mouth daily. 30 tablet 1  . naproxen sodium (ANAPROX) 220 MG tablet Take 220 mg by mouth 2 (two) times daily as needed.    . potassium chloride SA (K-DUR,KLOR-CON) 20 MEQ tablet Take 1 tablet (20 mEq total) by mouth daily. 30 tablet 1  . cephALEXin (KEFLEX) 250 MG capsule Take 1 capsule (250 mg total) by mouth every 8 (eight) hours. (Patient not taking: Reported on 01/21/2017) 9 capsule 0    Home: Home Living Family/patient expects to be discharged to:: Inpatient rehab Living Arrangements: Spouse/significant other Available Help at Discharge: Family, Personal care attendant, Available 24 hours/day Type of Home: House Home Access: Stairs to enter [employer] of Steps: 3 Entrance Stairs-Rails: Right Home Layout: One level Bathroom Shower/Tub: Health visitor: Standard Bathroom Accessibility: Yes Home Equipment: Wheelchair - manual, Environmental consultant - 2 wheels  Functional History: Prior Function Level of Independence: Independent with assistive device(s), Needs assistance Gait / Transfers Assistance Needed: Used RW for gait short distances.  Progressively more weak requiring assist until unable to stand. ADL's / Homemaking Assistance Needed: Assist with ADL's Functional Status:  Mobility: Bed Mobility  Overal bed mobility: Needs Assistance Bed Mobility: Rolling, Supine to Sit, Sit to Sidelying Rolling: Mod assist Supine to sit: Mod assist Sit to sidelying: Max assist, +2 for physical assistance General bed mobility comments: Patient using bed rail and able to bring LE's off of bed.  Required assist to bring trunk to sitting position.  Patient able to sit EOB with min guard assist.  Required max assist to move to sidelying to be cleaned - had BM. Transfers Overall transfer level: Needs assistance Equipment used: Rolling walker (2 wheeled) Transfers: Sit to/from Stand Sit to  Stand: Max assist, +2 physical assistance General transfer comment: Verbal cues for hand placement.  Provided +2 max assist to attempt to stand.  Required physical assist to get Lt hand onto RW.  Cues to bring hips under body, with physical cuing.  Unable to reach full upright standing.  Returned to sitting and pt had large loose BM.  Moved to sidelying to be cleaned. Ambulation/Gait General Gait Details: Unable  ADL:  Cognition: Cognition Overall Cognitive Status: Impaired/Different from baseline Orientation Level: Oriented X4 Cognition Arousal/Alertness: Awake/alert Behavior During Therapy: Flat affect Overall Cognitive Status: Impaired/Different from baseline Area of Impairment: Orientation, Attention, Memory, Problem solving, Following commands Orientation Level: Disoriented to, Place, Time, Situation Current Attention Level: Sustained Memory: Decreased short-term memory Following Commands: Follows one step commands inconsistently, Follows one step commands with increased time Problem Solving: Slow processing, Decreased initiation, Difficulty sequencing, Requires verbal cues General Comments: Patient fatigues quickly, with increased lethargy.   Stated he was at [facility], and November.  Blood pressure 97/61, pulse 88, temperature 98 F (36.7 C), temperature source Oral, resp. rate 20, height  (1.88 m), weight 102.6 kg (226 lb 1.6 oz), SpO2 93 %. Physical Exam  Vitals reviewed. Constitutional:  83 year old right-handed male sitting up in bed  HENT:  Craniotomy site well-healed  Eyes:  Pupils round and reactive to light  Neck: Normal range of motion. Neck supple. No thyromegaly present.  Cardiovascular:  Cardiac rate controlled  Respiratory:  Decreased breath sounds at the bases with some upper airway congestion  GI: Soft. Bowel sounds are normal. He exhibits no distension.  Neurological: He is alert.  Makes good eye contact with examiner. He was able to  provide his name, age and date of birth. Follows simple commands. He does have limited awareness of his recent hospital stay. RUE 4/5. LUE 3/5 prox to distal with edema. Bilateral LE: 3+HF, KE 4-, ADF/PF 4/5. No gross sensory findings.     Lab Results Last 24 Hours  No results found for this or any previous visit (from the past 24 hour(s)).    Imaging Results (Last 48 hours)  Dg Chest Port 1 View  Result Date: 01/25/2017 CLINICAL DATA:  Fever. EXAM: PORTABLE CHEST 1 VIEW COMPARISON:  Chest CT and chest radiographs dated 01/21/2017. FINDINGS: The cardiac silhouette remains borderline enlarged. Stable median sternotomy wires and left subclavian pacemaker leads. Mild increase in patchy interstitial prominence in the mid and lower lung zones bilaterally. No definite pleural fluid seen. Unremarkable bones. IMPRESSION: Mildly progressive bilateral pneumonitis/pneumonia. Electronically Signed   By: Beckie Salts M.D.   On: 01/25/2017 09:05     Assessment/Plan: Diagnosis: Debility related to sepsis in a patient well to known to me after bilateral SDH's. 1. Does the need for close, 24 hr/day medical supervision in concert with the patient's rehab needs make it unreasonable for this patient to be served in a less intensive setting? Yes 2.  Co-Morbidities requiring supervision/potential complications: afib, CAD, post TBI issues 3. Due to bladder management, bowel management, safety, skin/wound care, disease management, medication administration, pain management and patient education, does the patient require 24 hr/day rehab nursing? Yes 4. Does the patient require coordinated care of a physician, rehab nurse, PT (1-2 hrs/day, 5 days/week) and OT (1-2 hrs/day, 5 days/week) to address physical and functional deficits in the context of the above medical diagnosis(es)? Yes Addressing deficits in the following areas: balance, endurance, locomotion, strength, transferring, bowel/bladder control, bathing,  dressing, feeding, grooming, toileting and psychosocial support 5. Can the patient actively participate in an intensive therapy program of at least 3 hrs of therapy per day at least 5 days per week? Yes 6. The potential for patient to make measurable gains while on inpatient rehab is good 7. Anticipated functional outcomes upon discharge from inpatient rehab are supervision and min assist  with PT, supervision and min assist with OT, modified independent and supervision with SLP (+/-). 8. Estimated rehab length of stay to reach the above functional goals is: 9-13 days 9. Does the patient have adequate social supports and living environment to accommodate these discharge functional goals? Yes 10. Anticipated D/C setting: Home 11. Anticipated post D/C treatments: HH therapy and Outpatient therapy 12. Overall Rehab/Functional Prognosis: excellent  RECOMMENDATIONS: This patient's condition is appropriate for continued rehabilitative care in the following setting: CIR Patient has agreed to participate in recommended program. Yes Note that insurance prior authorization may be required for reimbursement for recommended care.  Comment: Rehab Admissions Coordinator to follow up.  Thanks,  Alex Oyster, MD, [place] [facility]    Charlton Amor., PA-C 01/26/2017    Revision History                        Routing History

## 2017-02-02 NOTE — Progress Notes (Signed)
Physical Therapy Session Note  Patient Details  Name: Alex [name] MRN: [id number] Date of Birth: September 26, 1931  Today's Date: 02/02/2017 PT Individual Time: 1520-1545 PT Individual Time Calculation (min): 25 min   Short Term Goals: Week 1:  PT Short Term Goal 1 (Week 1): Pt will perform bed mobility with mod assist +1. PT Short Term Goal 2 (Week 1): Pt will transfer sit<>stand with max assist +1. PT Short Term Goal 3 (Week 1): Pt will transfer bed<>w/c with max assist +1.  Skilled Therapeutic Interventions/Progress Updates:    pt resting in bed on arrival.  C/o fatigue and 7/10 pain but requests for pain medication when it's time for him to sleep.  Session focus on activity tolerance and transfers.  Pt transitions supine>sit with HOB elevated and mod assist for trunk elevation.  Able to sit EOB with steady assist for 2 minutes before fatiguing.  Sit<>stand in stedy, pt unable to come to full standing despite total assist.  Able to position stedy flaps with pt in squat position and pt required extended seated rest in stedy.  Pt requires total assist to lift hips enough to remove flaps to sit in w/c and required extended seated rest break in w/c before total assist sit<>squat for repositioning.  Positioned in chair to comfort with QRB in place, call bell in reach and needs met.   Therapy Documentation Precautions:  Precautions Precautions: Fall Precaution Comments: monitor O2, pt has LUE DVT (brachial) & currently not candidate for anticoagulation, recent hx of traumatic R SDH Restrictions Weight Bearing Restrictions: No General: PT Amount of Missed Time (min): 35 Minutes PT Missed Treatment Reason: Patient fatigue   See Function Navigator for Current Functional Status.   Therapy/Group: Individual Therapy  Earnest Conroy Penven-Crew 02/02/2017, 3:57 PM

## 2017-02-02 NOTE — Progress Notes (Signed)
Trish Mage, RN Rehab Admission Coordinator Signed Physical Medicine and Rehabilitation  PMR Pre-admission Date of Service: 01/31/2017 3:40 PM  Related encounter: ED to Hosp-Admission (Discharged) from 01/21/2017 in [facility] TELEMETRY/UROLOGY WEST       Hide copied text Hover for attribution information PMR Admission Coordinator Pre-Admission Assessment  Patient: Alex [name] is an 83 y.o., male MRN: [id number] DOB: 01/06/1931 Height:  (188 cm) Weight: 106.6 kg (235 lb 0.2 oz)                                                                                                                                      Insurance Information HMO:      PPO:       PCP:       IPA:       80/20:       OTHER:   PRIMARY:  [employer] medicare      Policy#:  [id number]      Subscriber:  Laurence Ferrari CM Name:                                Phone#:                          Fax#:   Pre-Cert#: [id number]      Employer:  Retired Benefits:  Phone #:  [phone]     Name:  Barrie Dunker. Date:  10/27/16     Deduct: $0      Out of Pocket Max: $4400      Life Max: N/A CIR: $345 days 1-5      SNF:  $0 days 1-20; $160 days 21-48; $0 days 49-100 Outpatient:  Medical necessity     Co-Pay: $40/visit Home Health: 100%      Co-Pay: none DME: 80%     Co-Pay: 20% Providers: in network  Emergency Contact Information        Contact Information    Name Relation Home Work Santiago Son [phone]  [phone]   Madhav, Mohon [phone]  [phone]   Autumn Messing [phone]  [phone]     Current Medical History  Patient Admitting Diagnosis:  Debility/Sepsis/TURP  History of Present Illness: An 83 yo right handed male with history of morbid obesity, alcohol use, COPD with nighttime oxygen, atrial fibrillation with AVR, pacemaker as well as 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  Was ambulating with minimal assistance using assistive device as well as maintained on a regular diet.  Presented 01/21/17 with decreased appetite, low-grade fever, hypotension and dry cough.  Chest x-ray suspect with bibasilar infiltrates.  Possible aspiration.  CT of the head with no acute abnormality since previous exam.  Urine study negative nitrates.  Lactic acid 1.98, WBC 7.4, BUN 19, creatinine 0.91.  Maintained on sepsis protocol, broad-spectrum antibiotics.  IV fluids added to maintain blood pressure.  Blood cultures grew Klebsiella pneumoniae, urine culture Escherichia coli as well as Klebsiella.  Cardiac rate remained controlled.  Maintained on low-dose aspirin as prior to admission.  He remains on a regular diet.  Request made for palliative care consult to discuss goals of care.  ID was consulted and left upper extremity dopplers were obtained due to swelling left upper extremity.  CT abdomen and pelvis obtained was worrisome for prostatitis and prostatic Urology was consulted and recommended unroofing of prostatic abscess.  Procedure and TURP were done after transfer to [facility] hospital on 01/29/17.  Is now afebrile and transitioned to PO antibiotics.  PT/OT progress with recommendations for CIR.  Patient is very motivated.     Total: 3=NIH  Past Medical History      Past Medical History:  Diagnosis Date  . Aortic stenosis    s/p AVR by Dr Laneta Simmers  . COPD (chronic obstructive pulmonary disease) (HCC)   . History of coronary artery disease    status post stenting of the marginal circumflex in 12/2003 and again in 2009  . Hyperlipidemia   . Morbid obesity (HCC)    weight 243 pounds, BMI 31.2kg/m2, BSA 2.36 square meters  . Obstructive sleep apnea    compliant with CPAP  . Persistent atrial fibrillation (HCC)     Family History  family history includes Alzheimer's disease in his mother; Other in his father; Tuberculosis in his  father.  Prior Rehab/Hospitalizations: Patient was on CIR and discharged home 01/10/17.  He had HH RN/PT/OT/SLP through [place] services.  Has the patient had major surgery during 100 days prior to admission? Yes.  Recently had B SDH with craniotomy and most recently a TURP.  Current Medications   Current Facility-Administered Medications:  .  acetaminophen (TYLENOL) tablet 650 mg, 650 mg, Oral, Q6H PRN, Clydie Braun, MD, 650 mg at 01/26/17 1848 .  ALPRAZolam Prudy Feeler) tablet 0.25 mg, 0.25 mg, Oral, QHS, Clydie Braun, MD, 0.25 mg at 01/30/17 2143 .  amiodarone (PACERONE) tablet 200 mg, 200 mg, Oral, BID, Clydie Braun, MD, 200 mg at 01/31/17 1100 .  aspirin chewable tablet 81 mg, 81 mg, Oral, Daily, Rondell A Katrinka Blazing, MD, 81 mg at 01/31/17 1100 .  atorvastatin (LIPITOR) tablet 40 mg, 40 mg, Oral, Daily, Clydie Braun, MD, 40 mg at 01/31/17 1100 .  escitalopram (LEXAPRO) tablet 10 mg, 10 mg, Oral, QHS, Rondell A Smith, MD, 10 mg at 01/30/17 2143 .  furosemide (LASIX) tablet 20 mg, 20 mg, Oral, Daily, [name], MD, 20 mg at 01/31/17 1100 .  HYDROcodone-acetaminophen (NORCO/VICODIN) 5-325 MG per tablet 1 tablet, 1 tablet, Oral, Q6H PRN, Vassie Loll, MD, 1 tablet at 01/31/17 0456 .  levETIRAcetam (KEPPRA) tablet 500 mg, 500 mg, Oral, BID, Clydie Braun, MD, 500 mg at 01/31/17 1100 .  magnesium oxide (MAG-OX) tablet 200 mg, 200 mg, Oral, Daily, Rondell A Smith, MD, 200 mg at 01/31/17 1100 .  MEDLINE mouth rinse, 15 mL, Mouth Rinse, BID, Rondell A Smith, MD, 15 mL at 01/30/17 1021 .  metoprolol succinate (TOPROL-XL) 24 hr tablet 12.5 mg, 12.5 mg, Oral, Daily, Clydie Braun, MD, Stopped at 01/31/17 1000 .  neomycin-bacitracin-polymyxin (NEOSPORIN) ointment 1 application, 1 application, Topical, TID PRN, Ihor Gully, MD .  ondansetron [facility]) injection 4 mg, 4 mg, Intravenous, Q4H PRN, Ihor Gully, MD .  sulfamethoxazole-trimethoprim (BACTRIM DS,SEPTRA DS) 800-160 MG per tablet  1 tablet, 1 tablet, Oral, Q12H, Cliffton Asters, MD, 1 tablet at 01/31/17 1100  Patients Current Diet: Diet regular Room service appropriate? Yes; Fluid consistency: Thin  Precautions / Restrictions Precautions Precautions: Fall Precaution Comments: Monitor oxygen sats, HR and BP Restrictions Weight Bearing Restrictions: No   Has the patient had 2 or more falls or a fall with injury in the past year?Yes.  He has suffered several falls with injury.  Prior Activity Level Household: Has been homebound since discharge from CIR on 01/10/17.  Had 24/7 care at home.  Home Assistive Devices / Equipment Home Assistive Devices/Equipment: CPAP, Eyeglasses, Dentures (specify type), Shower chair without back, Environmental consultant (specify type), Wheelchair Home Equipment: Wheelchair - manual, Environmental consultant - 2 wheels  Prior Device Use: Indicate devices/aids used by the patient prior to current illness, exacerbation or injury? Manual wheelchair and Walker  Prior Functional Level Prior Function Level of Independence: Needs assistance Gait / Transfers Assistance Needed: Used RW for gait short distances.  Progressively more weak requiring assist until unable to stand. ADL's / Homemaking Assistance Needed: Assist with ADL's Comments: Has 24 hour nursing assistance per chart, but reports that he was completing basic ADL independently.   Self Care: Did the patient need help bathing, dressing, using the toilet or eating?  Needed some help  Indoor Mobility: Did the patient need assistance with walking from room to room (with or without device)? Needed some help  Stairs: Did the patient need assistance with internal or external stairs (with or without device)? Needed some help  Functional Cognition: Did the patient need help planning regular tasks such as shopping or remembering to take medications? Needed some help  Current Functional Level Cognition  Overall Cognitive Status: Within Functional Limits for  tasks assessed Current Attention Level: Sustained Orientation Level: Oriented to person, Oriented to time Following Commands: Follows one step commands with increased time Safety/Judgement: Decreased awareness of safety, Decreased awareness of deficits General Comments: difficulty describing feeling in LUE; visitor reports he has had decreased coordination    Extremity Assessment (includes Sensation/Coordination)  Upper Extremity Assessment: LUE deficits/detail, Generalized weakness LUE Deficits / Details: Edema in L UE but per PT this has improved from yesterday. AROM WFL in L UE.  Lower Extremity Assessment: Generalized weakness    ADLs  Overall ADL's : Needs assistance/impaired Eating/Feeding: Set up, Sitting Grooming: Wash/dry face, Sitting, Min guard Grooming Details (indicate cue type and reason): Pt washed face after verbal cues for initiation and min assist for balance seated at EOB Upper Body Bathing: Minimal assistance, Sitting Lower Body Bathing: Sit to/from stand, Maximal assistance Upper Body Dressing : Minimal assistance, Sitting Lower Body Dressing: Maximal assistance, Sit to/from stand Toilet Transfer: Maximal assistance, +2 for safety/equipment, Stand-pivot, RW Toileting- Clothing Manipulation and Hygiene: Maximal assistance, Sit to/from stand Functional mobility during ADLs: Maximal assistance, +2 for physical assistance General ADL Comments: sat EOB x 5 minutes.  His back began to hurt so returned to supine.  Pt was unsteady sitting EOB.  Encouraged use of bil Ues for support.  He tends not to use L, although he has movement. When supine, he was able to fold a washcloth without dropping it, although it was not folded neatly.  Did not have him hold (thick shake); would benefit from lidded mug for regular drink.  BP 88/71 supine and 94/56 sitting up.  No c/o dizziness.      Mobility  Overal bed mobility: Needs Assistance Bed Mobility:  Supine to Sit Rolling: Mod  assist Supine to sit: Mod assist, +2 for physical assistance, HOB elevated Sit to sidelying: Max assist, +2 for physical assistance General bed mobility comments: max +2 for back to bed    Transfers  Overall transfer level: Needs assistance Equipment used: Rolling walker (2 wheeled) Transfers: Sit to/from Stand, Stand Pivot Transfers Sit to Stand: Max assist, +2 physical assistance, From elevated surface Stand pivot transfers: Max assist, +2 physical assistance General transfer comment: not attempted this session    Ambulation / Gait / Stairs / Wheelchair Mobility  Ambulation/Gait General Gait Details: unable to attempt due to poor transfer ability.    Posture / Balance Dynamic Sitting Balance Sitting balance - Comments: pt wobbly in all directions Balance Overall balance assessment: Needs assistance Sitting-balance support: Bilateral upper extremity supported Sitting balance-[name]: Poor (to fair; min guard for safety) Sitting balance - Comments: pt wobbly in all directions Postural control: Left lateral lean Standing balance support: Bilateral upper extremity supported, During functional activity Standing balance-[name]: Poor Standing balance comment: Reliant on RW and max A +2 to stand and perform stand pivot transfer.     Special needs/care consideration BiPAP/CPAP Yes, CPAP CPM No Continuous Drip IV No Dialysis No         Life Vest No Oxygen Yes on 02 at 3 1/2 L at this time to keep sats > 92 Special Bed No Trach Size No Wound Vac (area) No       Skin Has healing head incision from craniotomy.  Has very swollen L arm with blisters.                   Bowel mgmt: Last BM 01/28/17, incontinence at times Bladder mgmt: Condom catheter in place Diabetic mgmt No HOH - wife reports that patient has "selective" hearing loss    Previous Home Environment Living Arrangements: Spouse/significant other Available Help at Discharge: Family, Personal care attendant,  Available 24 hours/day Type of Home: House Home Layout: One level Home Access: Stairs to enter Entrance Stairs-Rails: Right Entrance Stairs-Number of Steps: 3 Bathroom Shower/Tub: Health visitor: Standard Bathroom Accessibility: Yes How Accessible: Accessible via walker Home Care Services: No Additional Comments: wife can provide supervision level  Discharge Living Setting Plans for Discharge Living Setting: Patient's home, House, Lives with (comment) (Lives with wife.) Type of Home at Discharge: House Discharge Home Layout: One level Discharge Home Access: Stairs to enter Entrance Stairs-Number of Steps: 3 steps Discharge Bathroom Shower/Tub: Walk-in shower Discharge Bathroom Toilet: Standard Discharge Bathroom Accessibility: Yes How Accessible: Accessible via walker Does the patient have any problems obtaining your medications?: No  Social/Family/Support Systems Patient Roles: Spouse, Parent, Other (Comment) (Has wife, son and good friend "adopted daughter", French Ana.) Contact Information: wife Anticipated Caregiver: wife, hired caregivers Anticipated Industrial/product designer Information: Please see emergency contacts above Ability/Limitations of Caregiver: Wife can provide supervision.  Patient has 24/7 caregivers at home. Caregiver Availability: 24/7 (Has hired help.) Discharge Plan Discussed with Primary Caregiver: Yes Is Caregiver In Agreement with Plan?: Yes Does Caregiver/Family have Issues with Lodging/Transportation while Pt is in Rehab?: No  Goals/Additional Needs Patient/Family Goal for Rehab: PT/OT supervision to min assist, SLP mod I and supervision goals Expected length of stay: 9-13 days Cultural Considerations: [facility] Dietary Needs: Regular diet, thin liquids Equipment Needs: TBD Pt/Family Agrees to Admission and willing to participate: Yes (Spoke with wife and friend, Haynes Bast.) Program Orientation Provided & Reviewed with Pt/Caregiver  Including Roles  & Responsibilities:  Yes (Familiar with program from stay on CIR recently.)  Decrease burden of Care through IP rehab admission: N/A  Possible need for SNF placement upon discharge: Not anticipated since patient has 24/7 caregiver support at home with hired caregivers.  Patient Condition: This patient's medical and functional status has changed since the consult dated: 01/26/17 in which the Rehabilitation Physician determined and documented that the patient's condition is appropriate for intensive rehabilitative care in an inpatient rehabilitation facility. See "History of Present Illness" (above) for medical update. Functional changes are:  Currently requiring mod to max assist for mobility . Patient's medical and functional status update has been discussed with the Rehabilitation physician and patient remains appropriate for inpatient rehabilitation. Will admit to inpatient rehab tomorrow.  Preadmission Screen Completed By:  Trish Mage, 01/31/2017 4:17 PM ______________________________________________________________________   Discussed status with Dr. Allena Katz on 01/31/17 at 1611 and received telephone approval for admission tomorrow.  Admission Coordinator:  Trish Mage, time 1611/Date 01/31/17       Cosigned by: Ankit Karis Juba, MD at 02/01/2017 5:50 AM  Revision History

## 2017-02-02 NOTE — Progress Notes (Signed)
[place] PHYSICAL MEDICINE & REHABILITATION     PROGRESS NOTE    Subjective/Complaints: No problems over night. Ready to start therapies. Denies any pain..  ROS: pt denies nausea, vomiting, diarrhea, cough, shortness of breath or chest pain   Objective: Vital Signs: Blood pressure 114/60, pulse 79, temperature 97.8 F (36.6 C), temperature source Oral, resp. rate 17, weight 106.8 kg (235 lb 7.2 oz), SpO2 94 %. No results found.  Recent Labs  01/31/17 0539 02/02/17 0620  WBC 8.8 9.9  HGB 10.0* 10.5*  HCT 31.6* 32.9*  PLT 252 253    Recent Labs  01/31/17 0539 02/02/17 0620  NA 138 138  K 4.1 3.9  CL 104 102  GLUCOSE 82 85  BUN 15 12  CREATININE 0.70 0.75  CALCIUM 7.7* 7.9*   CBG (last 3)  No results for input(s): GLUCAP in the last 72 hours.  Wt Readings from Last 3 Encounters:  02/01/17 106.8 kg (235 lb 7.2 oz)  02/01/17 106 kg (233 lb 11 oz)  01/10/17 91.8 kg (202 lb 6.1 oz)    Physical Exam:  Constitutional: He is oriented to person, place, and time. He appears well-developed.  Obese  HENT:  Head: Normocephalic. External ears normal. Eyes: Conjunctivae and EOM are normal. Right eye exhibits no discharge. Left eye exhibits no discharge.  Neck: Normal range of motion. Neck supple.  Cardiovascular:  IRR IRR  Respiratory:CTA B. No wheezes.  +[place]illard  GI: Soft. Bowel sounds are normal.  Musculoskeletal: He exhibits 2+ edema LUE, non tender, appropriately warm   Neurological: He is alert and oriented to person, place, and time.  Motor: 4-/5 UE. 3/5 prox LE to 4/5 distal.  Sensation intact to light touch  Skin: Skin is warm and dry.  Psychiatric: He has a normal mood and affect. His behavior is normal.   Assessment/Plan: 1. Functional deficits secondary to debility after sepsis/multiple medical which require 3+ hours per day of interdisciplinary therapy in a comprehensive inpatient rehab setting. Physiatrist is providing close team supervision and 24 hour  management of active medical problems listed below. Physiatrist and rehab team continue to assess barriers to discharge/monitor patient progress toward functional and medical goals.  Function:  Bathing Bathing position   Position: Bed  Bathing parts Body parts bathed by patient: Chest, Abdomen Body parts bathed by helper: Right arm, Left arm, Front perineal area, Buttocks, Right upper leg, Left upper leg, Right lower leg, Left lower leg, Back  Bathing assist Assist Level:  (Max Assist)      Upper Body Dressing/Undressing Upper body dressing   What is the patient wearing?: Hospital gown                Upper body assist Assist Level:  (Max Assist)      Lower Body Dressing/Undressing Lower body dressing   What is the patient wearing?: Non-skid slipper socks, Hospital Gown           Non-skid slipper socks- Performed by helper: Don/doff right sock, Don/doff left sock                  Lower body assist Assist for lower body dressing:  (Max Assist)      Toileting Toileting Toileting activity did not occur: No continent bowel/bladder event        Toileting assist     Transfers Chair/bed transfer   Chair/bed transfer method: Squat pivot Chair/bed transfer assist level: Total assist (Pt < 25%) Chair/bed transfer assistive device: Bedrails Mechanical  lift: Maximove   Locomotion Ambulation Ambulation activity did not occur: Safety/medical concerns         Wheelchair   Type: Manual Max wheelchair distance: 30 ft (BLE) Assist Level: Maximal assistance (Pt 25 - 49%)  Cognition Comprehension Comprehension assist level: Follows basic conversation/direction with no assist  Expression Expression assist level: Expresses basic needs/ideas: With no assist  Social Interaction Social Interaction assist level: Interacts appropriately 90% of the time - Needs monitoring or encouragement for participation or interaction.  Problem Solving Problem solving assist level:  Solves basic 75 - 89% of the time/requires cueing 10 - 24% of the time  Memory Memory assist level: Recognizes or recalls 50 - 74% of the time/requires cueing 25 - 49% of the time   Medical Problem List and Plan: 1.  Debilitation secondary to sepsis as well as history of bilateral SDH status post craniotomy 12/16/2016  -begin therapies today 2.  DVT Prophylaxis/Anticoagulation: SCDs. Monitor for any signs of DVT 3. Pain Management: PRN oxycodone 4. Mood: Xanax 0.25 mg daily at bedtime, Lexapro 10 mg daily at bedtime  -in good spirits presently 5. Neuropsych: This patient is capable of making decisions on his own behalf. 6. Skin/Wound Care: Routine skin checks 7. Fluids/Electrolytes/Nutrition: encourage PO  -I personally reviewed the patient's labs today.   8. Seizure prophylaxis. Keppra 500 mg twice a day 9. ID/bacteremia. Continue Rocephin initiated 01/22/2017--afebrile 10. COPD/OSA. Check oxygen saturations every shift 11. Atrial fibrillation/AVR/CABG/PPM. Continue low-dose aspirin as well as amiodarone 200 mg twice a day. Chronic Coumadin discontinued due to subdural hematomas 12. Diastolic congestive heart failure. Lasix 20 mg daily. Monitor for any signs of fluid overload  -needs daily weights 13. Hypertension. Currently with hypotension. Toprol-XL 12.5 mg daily, will holding presently 14. Hyperlipidemia. Lipitor 15. Prostatic abscess: S/p TURP. Cont abx 16. ABLA: Monitor CBC 17. LUE DVT: Brachial vein. Not an anticoagulation candidate due to recent subdural hematoma and craniotomy  -continue to elevate/conservative care   LOS (Days) 1 A FACE TO FACE EVALUATION WAS PERFORMED  [name], MD 02/02/2017 8:51 AM

## 2017-02-02 NOTE — Progress Notes (Signed)
Occupational Therapy Session Note  Patient Details  Name: Alex [name] MRN: [id number] Date of Birth: February 11, 1931  Today's Date: 02/02/2017 OT Individual Time: 1300-1400 OT Individual Time Calculation (min): 60 min    Short Term Goals: Week 1:  OT Short Term Goal 1 (Week 1): Pt will perform stand pivot transfer with mod A +1 to BSC/ toilet OT Short Term Goal 2 (Week 1): Pt will perform sit to stand for clothing management with  mod A  OT Short Term Goal 4 (Week 1): Pt will be oriented x4 with mod external cues  Skilled Therapeutic Interventions/Progress Updates:    Pt resting in bed upon arrival with family present.  Pt recalled therapist from prior admission.  Attempted to engage pt in bathing/dressing tasks seated EOB.  Pt required max A for supine>sit EOB with HOB elevated.  Pt required max A for sitting balance at EOB with significant R lean noted.  Pt's condom cath displaced during bed mobility taks.  Pt unable to maintain sitting balance sufficiently to engaged in bathing/dressing tasks.  Pt stated he thought he needed to have a bowel movement.  Pt returned to supine and repositioned in bed prior to placement of bed pan.  Pt required max a for rolling to R and L to facilitate placement of bed pan.  Pt remained on bed pan with call bell within reach.  Pt demonstrated use of call bell.  Continued family education including LTGs and ELOS.   Therapy Documentation Precautions:  Precautions Precautions: Fall Precaution Comments: monitor O2, pt has LUE DVT (brachial) & currently not candidate for anticoagulation, recent hx of traumatic R SDH Restrictions Weight Bearing Restrictions: No Pain:  Pt denied pain  See Function Navigator for Current Functional Status.   Therapy/Group: Individual Therapy  [name] 02/02/2017, 2:35 PM

## 2017-02-03 ENCOUNTER — Inpatient Hospital Stay (HOSPITAL_COMMUNITY): Payer: Medicare Other | Admitting: *Deleted

## 2017-02-03 ENCOUNTER — Inpatient Hospital Stay (HOSPITAL_COMMUNITY): Payer: Medicare Other | Admitting: Physical Therapy

## 2017-02-03 DIAGNOSIS — I82622 Acute embolism and thrombosis of deep veins of left upper extremity: Secondary | ICD-10-CM

## 2017-02-03 NOTE — IPOC Note (Signed)
Overall Plan of Care [facility]) Patient Details Name: Alex [name] MRN: [id number] DOB: 08/02/1931  Admitting Diagnosis: [facility] Problems: Principal Problem:   Debility Active Problems:   PAF (paroxysmal atrial fibrillation) (HCC)   Chronic obstructive pulmonary disease (HCC)   Acute deep vein thrombosis (DVT) of left upper extremity (HCC)   Acute blood loss anemia     Functional Problem List: Nursing Bladder, Bowel, Edema, Endurance, Medication Management, Motor, Nutrition, Perception, Safety, Sensory, Skin Integrity  PT Balance, Behavior, Edema, Endurance, Motor, Pain, Perception, Sensory, Safety, Skin Integrity  OT Cognition, Edema, Endurance, Motor, Balance, Safety  SLP    TR         Basic ADL's: OT Eating, Grooming, Bathing, Dressing, Toileting     Advanced  ADL's: OT       Transfers: PT Bed Mobility, Car, Bed to Chair, Occupational psychologist, Research scientist (life sciences): PT Psychologist, prison and probation services, Ambulation, Stairs     Additional Impairments: OT Fuctional Use of Upper Extremity  SLP        TR      Anticipated Outcomes Item Anticipated Outcome  Self Feeding Supervision  Swallowing      Basic self-care  Min A  Toileting  Supervision   Bathroom Transfers Supervision  Bowel/Bladder  Will manage bowel and bladder with mod assist  Transfers  min assist with LRAD  Locomotion  min assist with LRAD  Communication     Cognition     Pain  Pain less than 2   Safety/Judgment  Safety maintatined with mod assist   Therapy Plan: PT Intensity: Minimum of 1-2 x/day ,45 to 90 minutes PT Frequency: 5 out of 7 days PT Duration Estimated Length of Stay: 3-4 weeks OT Intensity: Minimum of 1-2 x/day, 45 to 90 minutes OT Frequency: 5 out of 7 days OT Duration/Estimated Length of Stay: 21-28 days         Team Interventions: Nursing Interventions Patient/Family Education, Bladder Management, Bowel Management, Disease Management/Prevention, Pain  Management, Medication Management, Skin Care/Wound Management, Dysphagia/Aspiration Precaution Training, Psychosocial Support  PT interventions Ambulation/gait training, Community reintegration, DME/adaptive equipment instruction, Neuromuscular re-education, Psychosocial support, Stair training, UE/LE Strength taining/ROM, Wheelchair propulsion/positioning, UE/LE Coordination activities, Therapeutic Activities, Skin care/wound management, Pain management, Discharge planning, Warden/ranger, Cognitive remediation/compensation, Disease management/prevention, Functional mobility training, Patient/family education, Therapeutic Exercise, Visual/perceptual remediation/compensation  OT Interventions Balance/vestibular training, Cognitive remediation/compensation, Discharge planning, DME/adaptive equipment instruction, Functional mobility training, Patient/family education, Self Care/advanced ADL retraining, Therapeutic Activities, Therapeutic Exercise, Visual/perceptual remediation/compensation, UE/LE Strength taining/ROM, UE/LE Coordination activities  SLP Interventions    TR Interventions    SW/CM Interventions Discharge Planning, Psychosocial Support, Patient/Family Education    Team Discharge Planning: Destination: PT-Home ,OT- Home (or ALF/SNF??) , SLP-  Projected Follow-up: PT-Home health PT, 24 hour supervision/assistance, OT-  Skilled nursing facility (ALF/SNF recommended), SLP-  Projected Equipment Needs: PT-To be determined, OT- To be determined, SLP-  Equipment Details: PT- , OT-  Patient/family involved in discharge planning: PT- Patient,  OT-Patient, SLP-   MD ELOS: 3-4 weeks Medical Rehab Prognosis:  Excellent Assessment: The patient has been admitted for CIR therapies with the diagnosis of debility after sepsis, recent SDH. The team will be addressing functional mobility, strength, stamina, balance, safety, adaptive techniques and equipment, self-care, bowel and bladder mgt,  patient and caregiver education, NMR, ego support, community reintegration. Goals have been set at supervision to min assist with mobility and self-care.    [name], MD, [place]  See Team Conference Notes for weekly updates to the plan of care

## 2017-02-03 NOTE — Progress Notes (Signed)
Occupational Therapy Session Note  Patient Details  Name: [name] MRN: [id number] Date of Birth: 1931/08/31  Today's Date: 02/03/2017 OT Individual Time: 1000-1100 OT Individual Time Calculation (min): 60 min    Short Term Goals: Week 1:  OT Short Term Goal 1 (Week 1): Pt will perform stand pivot transfer with mod A +1 to BSC/ toilet OT Short Term Goal 2 (Week 1): Pt will perform sit to stand for clothing management with  mod A  OT Short Term Goal 4 (Week 1): Pt will be oriented x4 with mod external cues  Skilled Therapeutic Interventions/Progress Updates:    Pt resting in bed upon arrival.  Initially attempted to engage patient in BADL retraining at w/c level.  Pt incontinent of bowel (pt not aware).  Pt required tot A +2 for hygiene and bed mobility rolling to facilitate cleaning.  Focus on bed mobility, supine<>sit EOB, and activity tolerance.  Pt able to sit EOB for approx 1 minute with max A for balance.  Pt returned to supine and remained in bed with all needs within reach.    Therapy Documentation Precautions:  Precautions Precautions: Fall Precaution Comments: monitor O2, pt has LUE DVT (brachial) & currently not candidate for anticoagulation, recent hx of traumatic R SDH Restrictions Weight Bearing Restrictions: No   Pain: Pt c/o increase sensitiity/pain on left buttocks during cleaning and redness noted; RN notified and attended to See Function Navigator for Current Functional Status.   Therapy/Group: Individual Therapy  [name] 02/03/2017, 12:15 PM

## 2017-02-03 NOTE — Patient Care Conference (Signed)
Inpatient RehabilitationTeam Conference and Plan of Care Update Date: 02/03/2017   Time: 2:45 PM    Patient Name: Alex [name]      Medical Record Number: [id number]  Date of Birth: Oct 24, 1931 Sex: Male         Room/Bed: 4W07C/4W07C-01 Payor Info: Payor: Advertising copywriter MEDICARE / Plan: [facility] MEDICARE / Product Type: *No Product type* /    Admitting Diagnosis: Debility Turp  Admit Date/Time:  02/01/2017 10:12 AM Admission Comments: No comment available   Primary Diagnosis:  Debility Principal Problem: Debility  Patient Active Problem List   Diagnosis Date Noted  . Debility 02/01/2017  . History of subdural hematoma   . Anxiety state   . Seizure prophylaxis   . Bacteremia   . Pacemaker   . Chronic diastolic congestive heart failure (HCC)   . Benign essential HTN   . Hypotension   . Hyperlipidemia   . Prostatic abscess   . Acute blood loss anemia   . Acute deep vein thrombosis (DVT) of left upper extremity (HCC)   . BPH with urinary obstruction: Per urology 01/29/2017 01/29/2017  . FUO (fever of unknown origin)   . Prostate abscess   . Fever   . HCAP (healthcare-associated pneumonia)   . Bacteremia due to Klebsiella pneumoniae   . Left arm swelling   . Transient hypotension 01/22/2017  . Sepsis (HCC) 01/21/2017  . Peripheral edema   . Bacterial UTI   . Urinary retention   . Hypertension   . Reactive hypertension   . Transaminitis   . Traumatic brain injury with loss of consciousness of 31 minutes to 59 minutes (HCC) 12/19/2016  . Acute pulmonary edema (HCC) 12/12/2016  . Acute diastolic CHF (congestive heart failure) (HCC) 12/12/2016  . Atrial flutter (HCC) 12/12/2016  . Traumatic subdural bleed with LOC of 1 hour to 5 hours 59 minutes (HCC) 12/11/2016  . Class 1 obesity due to excess calories with body mass index (BMI) of 30.0 to 30.9 in adult   . ETOH abuse   . Chronic obstructive pulmonary disease (HCC)   . Supplemental oxygen dependent   . Alcohol use   . OSA on  CPAP   . Atrial fibrillation (HCC)   . Coronary artery disease involving coronary bypass graft of native heart without angina pectoris   . Seizures (HCC)   . Dysphagia   . Bradycardia   . Hyperglycemia   . Agitation   . Hypokalemia   . Hypernatremia   . Leukocytosis   . Macrocytic anemia   . Thrombocytopenia (HCC)   . Traumatic subdural hematoma without loss of consciousness (HCC)   . Change in mental status   . Intracranial hematoma (HCC) 12/03/2016  . Sick sinus syndrome (HCC) 11/06/2015  . PAF (paroxysmal atrial fibrillation) (HCC) 11/14/2014  . SOB (shortness of breath) 10/31/2014  . Persistent atrial fibrillation (HCC) 10/31/2014  . Aortic stenosis, severe 07/28/2011  . 3-vessel coronary artery disease 07/28/2011  . Hyperlipidemia, mixed 07/28/2011    Expected Discharge Date: Expected Discharge Date: 02/27/17  Team Members Present: Physician leading conference: Arley Phenix, PsyD;Dr. Faith Rogue Social Worker Present: Amada Jupiter, LCSW Nurse Present: Carmie End, RN PT Present: Aleda Grana, PT OT Present: Callie Fielding, OT SLP Present: Feliberto Gottron, SLP PPS Coordinator present : Tora Duck, RN, CRRN     Current Status/Progress Goal Weekly Team Focus  Medical   pt here after developing urosepsis/outflow issues after previous TBI  improve activity tolerance and consistency  volume, edema/dvt mgt,  bp control   Bowel/Bladder   Incontinent of bladder, condom cath hs, continent of bowel LBM 02-03-17  Continent of bowel and bladder  Assist with toileting needs, timed toileting q2-4   Swallow/Nutrition/ Hydration             ADL's   BADLs-max A/tot A overall; limited endurance; sitting balance-max A; bed mobility-max A  supervision overall; min A LB dressing and shower transfers      Mobility   total assist bed<>w/c, unable to stand, very poor endurance & strength  max assist overall  bed mobility, transfers, initiate standing, activity tolerance    Communication             Safety/Cognition/ Behavioral Observations            Pain   no c/o pain  no c/o pain  Assess pain q shift and prn   Skin   MASD to bottom barrier cream in use, L arm edema DVT  no new skin breakdown/infection  Assess skin q shift and prn, barrier cream    Rehab Goals Patient on target to meet rehab goals: Yes *See Care Plan and progress notes for long and short-term goals.  Barriers to Discharge: prior tbi, age/inconsisteny, decreased activity tolerance    Possible Resolutions to Barriers:  continued strength/stamina training, adaptive equipment    Discharge Planning/Teaching Needs:  Home with spouse who can provide supervision/ and private duty caregivers.  Education to be completed prior to d/c.   Team Discussion:  Returning pt to CIR (d/c less than one month ago) now with debility following sepsis.  Very deconditioned and have already changed schedule to 15/7.  Left arm still limited and with lymphedema;  Currently requiring total assist and goals being set for min assist overall.  Monitor heels and bottom.    Revisions to Treatment Plan:  NA   Continued Need for Acute Rehabilitation Level of Care: The patient requires daily medical management by a physician with specialized training in physical medicine and rehabilitation for the following conditions: Daily direction of a multidisciplinary physical rehabilitation program to ensure safe treatment while eliciting the highest outcome that is of practical value to the patient.: Yes Daily medical management of patient stability for increased activity during participation in an intensive rehabilitation regime.: Yes  Drexel Ivey 02/03/2017, 6:38 PM

## 2017-02-03 NOTE — Progress Notes (Addendum)
Physical Therapy Session Note  Patient Details  Name: Alex [name] MRN: [id number] Date of Birth: [birth year]  Today's Date: 02/03/2017 PT Individual Time: 4098-1191 and 4782-9562 PT Individual Time Calculation (min): 61 min and 75 min  Short Term Goals: Week 1:  PT Short Term Goal 1 (Week 1): Pt will perform bed mobility with mod assist +1. PT Short Term Goal 2 (Week 1): Pt will transfer sit<>stand with max assist +1. PT Short Term Goal 3 (Week 1): Pt will transfer bed<>w/c with max assist +1.  Skilled Therapeutic Interventions/Progress Updates:  Treatment 1: Pt received in bed & agreeable to tx. Session focused on bed mobility, self car, and activity tolerance. Pt observed to be incontinent of bowel and performed rolling L<>R with max assist, cuing for technique and minimal use of LUE, and facilitation for weight shifting. Pt rolled multiple times to allow therapist to perform peri-hygiene total assist, don clean brief, and don pants. Pt able to don shirt with assistance to pull shirt over head and down trunk. Pt very fatigued following activity (reporting 7/10 fatigue). Provided pt with TIS w/c for increased comfort and to promote pt sitting in w/c throughout the day. Pt transferred bed<>w/c with maximove for energy conservation. Adjusted w/c parts for optimal comfort & positioning. At end of session pt left sitting in w/c with QRB donned, all needs within reach, & wife present to supervise. Pt on 2L/min supplemental oxygen throughout session & at end SpO2 = 90%.  Treatment 2:  Pt received asleep in bed but easily awakened & agreeable to tx. Spent significant time discussing pt's goals and purpose of CIR. Pt reports he just wants someone "to lift one leg, then the other" and "not have to strain". Therapist provided education & encouragement regarding benefits of participating in therapy. Pt agreeable to transferring out of bed but observed to be incontinent of bowel & bladder. Pt performed rolling  L<>R several times with mod assist, cuing for technique, and use of bed rails to allow therapist & NT to doff pants & brief, perform peri-hygiene, and don clean gown total assist. Pt unable to doff shirt, requiring total assist to pull it over his head, but min assist to remove L arm from shirt. Pt transferred bed>TIS w/c via maximove total assist. Transported pt to dayroom & pt reluctantly agreeable to BLE exercises. Pt performed BLE long arc quads and marches with cuing for proper technique. Pt also requires cuing to breathe through nose instead of mouth. At end of session pt left sitting in TIS w/c in room with all needs within reach & QRB donned.   Addendum: Pt with poor memory as he is unable to recall what he did he previous therapy sessions on this date. Pt also reporting he did not consume breakfast or lunch - therapist encouraged pt to increase intake to maintain his energy/strength.   Therapy Documentation Precautions:  Precautions Precautions: Fall Precaution Comments: monitor O2, pt has LUE DVT (brachial) & currently not candidate for anticoagulation, recent hx of traumatic R SDH Restrictions Weight Bearing Restrictions: No   Pain: Denied c/ pain.  See Function Navigator for Current Functional Status.   Therapy/Group: Individual Therapy  Sandi Mariscal 02/03/2017, 5:33 PM

## 2017-02-03 NOTE — Progress Notes (Signed)
[place] PHYSICAL MEDICINE & REHABILITATION     PROGRESS NOTE    Subjective/Complaints: Resting. Feels well. Able to sleep last night  ROS: pt denies nausea, vomiting, diarrhea, cough, shortness of breath or chest pain   Objective: Vital Signs: Blood pressure 110/71, pulse 60, temperature 97.6 F (36.4 C), temperature source Oral, resp. rate 20, weight 106.8 kg (235 lb 7.2 oz), SpO2 97 %. No results found.  Recent Labs  02/02/17 0620  WBC 9.9  HGB 10.5*  HCT 32.9*  PLT 253    Recent Labs  02/02/17 0620  NA 138  K 3.9  CL 102  GLUCOSE 85  BUN 12  CREATININE 0.75  CALCIUM 7.9*   CBG (last 3)  No results for input(s): GLUCAP in the last 72 hours.  Wt Readings from Last 3 Encounters:  02/01/17 106.8 kg (235 lb 7.2 oz)  02/01/17 106 kg (233 lb 11 oz)  01/10/17 91.8 kg (202 lb 6.1 oz)    Physical Exam:  Constitutional: He is oriented to person, place, and time. He appears well-developed.  Obese  HENT:  Head: Normocephalic. External ears normal. Eyes: Conjunctivae and EOM are normal. Right eye exhibits no discharge. Left eye exhibits no discharge.  Neck: Normal range of motion. Neck supple.  Cardiovascular:  IRR/IRR Respiratory:CTA B.  With [place]  GI: Soft. Bowel sounds are normal.  Musculoskeletal: He exhibits 2+ edema LUE, non tender, no warmth   Neurological: He is alert and oriented to person, place, and time.  Motor: 4-/5 UE. 3/5 HF,KE 4/5 distal.  Sensation intact to light touch  Skin: Skin is warm and dry.  Psychiatric: He has a normal mood and affect. His behavior is normal.   Assessment/Plan: 1. Functional deficits secondary to debility after sepsis/multiple medical which require 3+ hours per day of interdisciplinary therapy in a comprehensive inpatient rehab setting. Physiatrist is providing close team supervision and 24 hour management of active medical problems listed below. Physiatrist and rehab team continue to assess barriers to  discharge/monitor patient progress toward functional and medical goals.  Function:  Bathing Bathing position   Position: Wheelchair/chair at sink  Bathing parts Body parts bathed by patient: Chest, Abdomen Body parts bathed by helper: Right arm, Left arm  Bathing assist Assist Level:  (Max Assist)      Upper Body Dressing/Undressing Upper body dressing   What is the patient wearing?: Hospital gown                Upper body assist Assist Level:  (Max Assist)      Lower Body Dressing/Undressing Lower body dressing   What is the patient wearing?: Non-skid slipper socks, Hospital Gown           Non-skid slipper socks- Performed by helper: Don/doff right sock, Don/doff left sock                  Lower body assist Assist for lower body dressing:  (Max Assist)      Toileting Toileting Toileting activity did not occur: No continent bowel/bladder event   Toileting steps completed by helper: Adjust clothing prior to toileting, Performs perineal hygiene, Adjust clothing after toileting (wearing gown)    Toileting assist     Transfers Chair/bed transfer   Chair/bed transfer method: Squat pivot Chair/bed transfer assist level: 2 helpers Chair/bed transfer assistive device: Bedrails Mechanical lift: Maximove   Locomotion Ambulation Ambulation activity did not occur: Safety/medical concerns         Wheelchair  Type: Manual Max wheelchair distance: 30 ft (BLE) Assist Level: Maximal assistance (Pt 25 - 49%)  Cognition Comprehension Comprehension assist level: Follows basic conversation/direction with no assist  Expression Expression assist level: Expresses basic needs/ideas: With no assist  Social Interaction Social Interaction assist level: Interacts appropriately 90% of the time - Needs monitoring or encouragement for participation or interaction.  Problem Solving Problem solving assist level: Solves basic 75 - 89% of the time/requires cueing 10 - 24% of the  time  Memory Memory assist level: Recognizes or recalls 50 - 74% of the time/requires cueing 25 - 49% of the time   Medical Problem List and Plan: 1.  Debilitation secondary to sepsis as well as history of bilateral SDH status post craniotomy 12/16/2016  -continue therapies  -team conference 2.  DVT Prophylaxis/Anticoagulation: SCDs. Monitor for any signs of DVT 3. Pain Management: PRN oxycodone 4. Mood: Xanax 0.25 mg daily at bedtime, Lexapro 10 mg daily at bedtime  -in good spirits presently 5. Neuropsych: This patient is capable of making decisions on his own behalf. 6. Skin/Wound Care: Routine skin checks 7. Fluids/Electrolytes/Nutrition: encourage PO   8. Seizure prophylaxis. Keppra 500 mg twice a day 9. ID/bacteremia. Continue Rocephin initiated 01/22/2017--afebrile 10. COPD/OSA. Check oxygen saturations every shift 11. Atrial fibrillation/AVR/CABG/PPM. Continue low-dose aspirin as well as amiodarone 200 mg twice a day. Chronic Coumadin discontinued due to subdural hematomas 12. Diastolic congestive heart failure. Lasix 20 mg daily. Monitor for any signs of fluid overload  -need daily weights 13. Hypertension. Currently with hypotension. Toprol-XL 12.5 mg daily, will holding presently 14. Hyperlipidemia. Lipitor 15. Prostatic abscess: S/p TURP. Cont abx 16. ABLA: Monitor CBC 17. LUE DVT: Brachial vein. Not an anticoagulation candidate due to recent subdural hematoma and craniotomy  -continue to elevate/conservative care--stable to improved   LOS (Days) 2 A FACE TO FACE EVALUATION WAS PERFORMED  [name], MD 02/03/2017 8:24 AM

## 2017-02-03 NOTE — Progress Notes (Signed)
Physical Therapy Note  Patient Details  Name: DENILSON SALMINEN MRN: [id number] Date of Birth: 1931-02-14 Today's Date: 02/03/2017    Pt's plan of care adjusted to 15/7 after speaking with care team and Tom, OTA discussed with PA Jesusita Oka) as pt currently unable to tolerate current therapy schedule with OT, and PT.     Sandi Mariscal 02/03/2017, 12:19 PM

## 2017-02-04 ENCOUNTER — Inpatient Hospital Stay (HOSPITAL_COMMUNITY): Payer: Medicare Other | Admitting: Physical Therapy

## 2017-02-04 ENCOUNTER — Inpatient Hospital Stay (HOSPITAL_COMMUNITY): Payer: Medicare Other | Admitting: Occupational Therapy

## 2017-02-04 ENCOUNTER — Inpatient Hospital Stay (HOSPITAL_COMMUNITY): Payer: Medicare Other | Admitting: *Deleted

## 2017-02-04 ENCOUNTER — Encounter (HOSPITAL_COMMUNITY): Payer: Medicare Other | Admitting: Psychology

## 2017-02-04 DIAGNOSIS — D62 Acute posthemorrhagic anemia: Secondary | ICD-10-CM

## 2017-02-04 MED ORDER — ACETAMINOPHEN 325 MG PO TABS
325.0000 mg | ORAL_TABLET | ORAL | Status: DC | PRN
  Administered 2017-02-12 – 2017-02-15 (×2): 325 mg via ORAL
  Filled 2017-02-04 (×3): qty 1

## 2017-02-04 MED ORDER — ACETAMINOPHEN 325 MG PO TABS
650.0000 mg | ORAL_TABLET | Freq: Two times a day (BID) | ORAL | Status: DC
  Administered 2017-02-04 – 2017-02-10 (×11): 650 mg via ORAL
  Filled 2017-02-04 (×13): qty 2

## 2017-02-04 MED ORDER — OXYCODONE HCL 5 MG PO TABS
5.0000 mg | ORAL_TABLET | Freq: Two times a day (BID) | ORAL | Status: DC
  Administered 2017-02-04 – 2017-02-08 (×7): 5 mg via ORAL
  Filled 2017-02-04 (×9): qty 1

## 2017-02-04 NOTE — Progress Notes (Signed)
[place] PHYSICAL MEDICINE & REHABILITATION     PROGRESS NOTE    Subjective/Complaints: Resting. Feels well. Able to sleep last night  ROS: pt denies nausea, vomiting, diarrhea, cough, shortness of breath or chest pain   Objective: Vital Signs: Blood pressure (!) 106/59, pulse 98, temperature 97.5 F (36.4 C), temperature source Oral, resp. rate 19, weight 106.8 kg (235 lb 7.2 oz), SpO2 (!) 88 %. No results found.  Recent Labs  02/02/17 0620  WBC 9.9  HGB 10.5*  HCT 32.9*  PLT 253    Recent Labs  02/02/17 0620  NA 138  K 3.9  CL 102  GLUCOSE 85  BUN 12  CREATININE 0.75  CALCIUM 7.9*   CBG (last 3)  No results for input(s): GLUCAP in the last 72 hours.  Wt Readings from Last 3 Encounters:  02/01/17 106.8 kg (235 lb 7.2 oz)  02/01/17 106 kg (233 lb 11 oz)  01/10/17 91.8 kg (202 lb 6.1 oz)    Physical Exam:  Constitutional: He is oriented to person, place, and time. He appears well-developed.  Obese  HENT:  Head: Normocephalic. External ears normal. Eyes: Conjunctivae and EOM are normal. Right eye exhibits no discharge. Left eye exhibits no discharge.  Neck: Normal range of motion. Neck supple.  Cardiovascular:  IRR/IRR Respiratory:CTA B.  With [place]  GI: Soft. Bowel sounds are normal.  Musculoskeletal: He exhibits 2+ edema LUE, non tender, no warmth   Neurological: He is alert and oriented to person, place, and time.  Motor: 4-/5 UE. 3/5 HF,KE 4/5 distal.  Sensation intact to light touch  Skin: Skin is warm and dry.  Psychiatric: He has a normal mood and affect. His behavior is normal.   Assessment/Plan: 1. Functional deficits secondary to debility after sepsis/multiple medical which require 3+ hours per day of interdisciplinary therapy in a comprehensive inpatient rehab setting. Physiatrist is providing close team supervision and 24 hour management of active medical problems listed below. Physiatrist and rehab team continue to assess barriers to  discharge/monitor patient progress toward functional and medical goals.  Function:  Bathing Bathing position   Position: Wheelchair/chair at sink  Bathing parts Body parts bathed by patient: Chest, Abdomen Body parts bathed by helper: Right arm, Left arm  Bathing assist Assist Level:  (Max Assist)      Upper Body Dressing/Undressing Upper body dressing   What is the patient wearing?: Hospital gown                Upper body assist Assist Level:  (Max Assist)      Lower Body Dressing/Undressing Lower body dressing   What is the patient wearing?: Non-skid slipper socks, Hospital Gown           Non-skid slipper socks- Performed by helper: Don/doff right sock, Don/doff left sock                  Lower body assist Assist for lower body dressing:  (Max Assist)      Toileting Toileting Toileting activity did not occur: No continent bowel/bladder event Toileting steps completed by patient: Adjust clothing prior to toileting Toileting steps completed by helper: Adjust clothing prior to toileting, Performs perineal hygiene, Adjust clothing after toileting    Toileting assist     Transfers Chair/bed transfer   Chair/bed transfer method: Squat pivot Chair/bed transfer assist level: 2 helpers Chair/bed transfer assistive device: Mechanical lift Mechanical lift: Maximove   Locomotion Ambulation Ambulation activity did not occur: Safety/medical concerns  Wheelchair   Type: Manual Max wheelchair distance: 30 ft (BLE) Assist Level: Maximal assistance (Pt 25 - 49%)  Cognition Comprehension Comprehension assist level: Follows basic conversation/direction with no assist  Expression Expression assist level: Expresses basic needs/ideas: With no assist  Social Interaction Social Interaction assist level: Interacts appropriately 90% of the time - Needs monitoring or encouragement for participation or interaction.  Problem Solving Problem solving assist level:  Solves basic 75 - 89% of the time/requires cueing 10 - 24% of the time  Memory Memory assist level: Recognizes or recalls 50 - 74% of the time/requires cueing 25 - 49% of the time   Medical Problem List and Plan: 1.  Debilitation secondary to sepsis as well as history of bilateral SDH status post craniotomy 12/16/2016  -continue therapies  -spoke to patient this am about what he saw as the main barrier for him to recovery/return home---he denied mood, but stated that pain, especially in his back was limiting. 2.  DVT Prophylaxis/Anticoagulation: SCDs. Monitor for any signs of DVT 3. Pain Management: PRN oxycodone  -will schedule oxycodone and tyleno prior therapy each am and at noon 4. Mood: Xanax 0.25 mg daily at bedtime, Lexapro 10 mg daily at bedtime  -denies depression at present  -neuropsych to see this morning.  5. Neuropsych: This patient is capable of making decisions on his own behalf. 6. Skin/Wound Care: Routine skin checks 7. Fluids/Electrolytes/Nutrition: encourage PO   8. Seizure prophylaxis. Keppra 500 mg twice a day 9. ID/bacteremia. Continue Rocephin initiated 01/22/2017--afebrile 10. COPD/OSA. Check oxygen saturations every shift 11. Atrial fibrillation/AVR/CABG/PPM. Continue low-dose aspirin as well as amiodarone 200 mg twice a day. Chronic Coumadin discontinued due to subdural hematomas 12. Diastolic congestive heart failure. Lasix 20 mg daily. Monitor for any signs of fluid overload  -need daily weights!! 13. Hypertension. Currently with hypotension. Toprol-XL 12.5 mg daily, will holding presently 14. Hyperlipidemia. Lipitor 15. Prostatic abscess: S/p TURP. Cont abx 16. ABLA: Monitor CBC 17. LUE DVT: Brachial vein. Not an anticoagulation candidate due to recent subdural hematoma and craniotomy  -continue to elevate/conservative care--stable to improved   LOS (Days) 3 A FACE TO FACE EVALUATION WAS PERFORMED  [name], MD 02/04/2017 8:51 AM

## 2017-02-04 NOTE — Progress Notes (Signed)
Physical Therapy Session Note  Patient Details  Name: Alex [name] MRN: [id number] Date of Birth: 09-Aug-1931  Today's Date: 02/04/2017 PT Individual Time: 1600-1620 PT Individual Time Calculation (min): 20 min   Short Term Goals: Week 1:  PT Short Term Goal 1 (Week 1): Pt will perform bed mobility with mod assist +1. PT Short Term Goal 2 (Week 1): Pt will transfer sit<>stand with max assist +1. PT Short Term Goal 3 (Week 1): Pt will transfer bed<>w/c with max assist +1. Week 2:     Skilled Therapeutic Interventions/Progress Updates:  Pt received supine in bed pt reports being very tired not feeling well.   Vitals assessed in Supine. 89/58, pt reports mild lightheadedness without exertion. SpO2 86%-87% on room air. PT discussed vital signs with RN. Placed pt on 2L supplemental O2 per RN due to low SpO2.   Pt left supine in bed with call bell in reach.      Therapy Documentation Precautions:  Precautions Precautions: Fall Precaution Comments: monitor O2, pt has LUE DVT (brachial) & currently not candidate for anticoagulation, recent hx of traumatic R SDH Restrictions Weight Bearing Restrictions: No General: PT Amount of Missed Time (min): 40 Minutes PT Missed Treatment Reason: Patient fatigue Vital Signs: Therapy Vitals Temp: 98 F (36.7 C) Temp Source: Oral Pulse Rate: (!) 56 Resp: 19 BP: (!) 80/56 (RN [place] and PA Dan notified) Patient Position (if appropriate): Lying Oxygen Therapy SpO2: 91 % O2 Device: Not Delivered   See Function Navigator for Current Functional Status.   Therapy/Group: Individual Therapy  [name] 02/04/2017, 5:46 PM

## 2017-02-04 NOTE — Consult Note (Signed)
Neuropsychological Consultation   Patient:   Alex [name]   DOB:   07-Mar-1931  MR Number:  [id number]  Location:  [facility] [id number] [place] [address] Dept: [phone] Loc: [phone]           Date of Service:   02/04/2017  Start Time:   8 AM End Time:   9 AM  Provider/Observer:  Arley Phenix, Psy.D.       Clinical Neuropsychologist       Billing Code/Service: [id number] 4 Units  Chief Complaint:    The patient is having some cognitive processing issues and there was a referral for neuropsychological consultation to initially assess.   Reason for Service:  The patient was referred for a neuropsychological consultation.  He was seen seen after a subdural hematoma due to traumatic fall.  He received inpatient rehabilitation services between feb 23, 18 and march 17th 2018.  After discharge home patient was doing very well needing minimal assistance with assistive device and on regular diet.  Present to ED on 3.28.2018 after new decline in functioning and was readmitted to rehabilitative services.    Current Status:  The patient has some disorentation to time (day of week and date) and reduced mental processing speed.  No indication of significant anxiety or depression  Reliability of Information: Information from medical chart and review of case with treatment team.  1 hour clinical interview with patient.    Behavioral Observation: Alex [name]  presents as a 83 y.o.-year-old Right Caucasian Male who appeared his stated age. his dress was Appropriate and he was Well Groomed and his manners were Appropriate to the situation.  his participation was indicative of Appropriate and Attentive behaviors.  There were physical disabilities noted.  he displayed an appropriate level of cooperation and motivation.     Interactions:    Active Appropriate and Attentive  Attention:   The patient  was focused on our discussion and able to maintain adequate train of thought but would lose track and focus. and attention span appeared shorter than expected for age  Memory:   abnormal; global memory impairment noted  Visuo-spatial:  not examined  Speech (Volume):  low  Speech:   Slowed and some indication of word finding difficulties  Thought Process:  Circumstantial and Disorganized  Though Content:  WNL; not suicidal and not homicidal  Orientation:   person, place and situation  Judgment:   Fair  Planning:   Poor  Affect:    Flat  Mood:    NA  Insight:   Lacking  Intelligence:   normal  Medical History:   Past Medical History:  Diagnosis Date  . Aortic stenosis    s/p AVR by Dr Laneta Simmers  . COPD (chronic obstructive pulmonary disease) (HCC)   . History of coronary artery disease    status post stenting of the marginal circumflex in 12/2003 and again in 2009  . Hyperlipidemia   . Morbid obesity (HCC)    weight 243 pounds, BMI 31.2kg/m2, BSA 2.36 square meters  . Obstructive sleep apnea    compliant with CPAP  . Persistent atrial fibrillation (HCC)             Family Med/Psych History:  Family History  Problem Relation Age of Onset  . Alzheimer's disease Mother   . Tuberculosis Father   . Other Father     Spinal Meningitis    Risk of  Suicide/Violence: virtually non-existent Patient denies SI or HI.  Impression/DX:  The patient had improved significantly from Subdural hematoma and was discharged home needing minimal assistance at home from his wife. However, the patient began to deteriorate became more lethargic or less engaged. There did not appear to be any acute issues that developed but the patient had deteriorated from the level that he was at upon his first discharge.  Disposition/Plan:  Will continue to assess the possibility or need for further neuropsychological testing.          Electronically Signed   _______________________ Arley Phenix,  Psy.D.

## 2017-02-04 NOTE — Progress Notes (Signed)
Occupational Therapy Session Note  Patient Details  Name: Alex [name] MRN: [id number] Date of Birth: Sep 24, 1931  Today's Date: 02/04/2017 OT Individual Time: 1400-1444 OT Individual Time Calculation (min): 44 min    Short Term Goals: Week 1:  OT Short Term Goal 1 (Week 1): Pt will perform stand pivot transfer with mod A +1 to BSC/ toilet OT Short Term Goal 2 (Week 1): Pt will perform sit to stand for clothing management with  mod A  OT Short Term Goal 4 (Week 1): Pt will be oriented x4 with mod external cues  Skilled Therapeutic Interventions/Progress Updates:    Upon entering the room, pt seated in wheelchair with wife present. Pt with no c/o pain. L UE continues to weep. Pt oriented to self and location but needing max cues to orient to situation and time. Pt remembers this therapist from prior admission but unable to state my name. OT propelled pt to gym. Pt seated in wheelchair for table top activity. Pt having difficulty utilizing L UE to manipulate items in hand and place into peg board. Majority of task completed with R UE for moderate difficulty pattern. Pt needing increased time and min cues to initiate task. Pt returned to room and tilted in wheelchair for pressure relief. NT arriving for vitals at this time. Pt remained in wheelchair with quick release belt donned upon exiting the room.   Therapy Documentation Precautions:  Precautions Precautions: Fall Precaution Comments: monitor O2, pt has LUE DVT (brachial) & currently not candidate for anticoagulation, recent hx of traumatic R SDH Restrictions Weight Bearing Restrictions: No General:   Vital Signs: Therapy Vitals Temp: 98 F (36.7 C) Temp Source: Oral Pulse Rate: (!) 56 Resp: 19 BP: (!) 80/56 (RN [place] and PA Dan notified) Patient Position (if appropriate): Lying Oxygen Therapy SpO2: 91 % O2 Device: Not Delivered Pain:   ADL: ADL ADL Comments: See functional assessment tool Exercises:   Other Treatments:     See Function Navigator for Current Functional Status.   Therapy/Group: Individual Therapy  Alen Bleacher 02/04/2017, 4:43 PM

## 2017-02-04 NOTE — Progress Notes (Signed)
Occupational Therapy Session Note  Patient Details  Name: Alex [name] MRN: [id number] Date of Birth: [birth year]  Today's Date: 02/04/2017 OT Individual Time: 1000-1100 OT Individual Time Calculation (min): 60 min    Short Term Goals: Week 1:  OT Short Term Goal 1 (Week 1): Pt will perform stand pivot transfer with mod A +1 to BSC/ toilet OT Short Term Goal 2 (Week 1): Pt will perform sit to stand for clothing management with  mod A  OT Short Term Goal 4 (Week 1): Pt will be oriented x4 with mod external cues  Skilled Therapeutic Interventions/Progress Updates:    Pt resting in bed upon arrival with wife present.  Wife departed to reduce possible distractions.  Pt engaged in LB bathing/dressing tasks at bed level before transferring to TIS w/c with Maxi Move.  Pt required mod a for rolling in bed to facilitate cleaning and pulling up pants.  Pt exhibited difficulty raising BLE to facilitate threading pants and required assistance to raise BLE adequately.  Pt engaged in UB bathing/dressing tasks seated in w/c at sink.  Pt requires more than a reasonable amount of time to complete tasks with multiple rest breaks.  Focus on activity tolerance, bed mobility, BADL retraining, and safety awareness to increase independence with BADLs.   Therapy Documentation Precautions:  Precautions Precautions: Fall Precaution Comments: monitor O2, pt has LUE DVT (brachial) & currently not candidate for anticoagulation, recent hx of traumatic R SDH Restrictions Weight Bearing Restrictions: No  Pain: Pain Assessment Pain Score: 3  ADL: ADL ADL Comments: See functional assessment tool  See Function Navigator for Current Functional Status.   Therapy/Group: Individual Therapy  [name] 02/04/2017, 11:07 AM

## 2017-02-05 ENCOUNTER — Inpatient Hospital Stay (HOSPITAL_COMMUNITY): Payer: Medicare Other | Admitting: Physical Therapy

## 2017-02-05 ENCOUNTER — Inpatient Hospital Stay (HOSPITAL_COMMUNITY): Payer: Medicare Other

## 2017-02-05 MED ORDER — SALINE SPRAY 0.65 % NA SOLN
1.0000 | NASAL | Status: DC | PRN
  Administered 2017-02-05 – 2017-02-06 (×2): 1 via NASAL
  Filled 2017-02-05: qty 44

## 2017-02-05 NOTE — Progress Notes (Signed)
Physical Therapy Session Note  Patient Details  Name: Alex [name] MRN: [id number] Date of Birth: 02-Jan-1931  Today's Date: 02/05/2017 PT Individual Time: 1430-1500 PT Individual Time Calculation (min): 30 min   Short Term Goals: Week 1:  PT Short Term Goal 1 (Week 1): Pt will perform bed mobility with mod assist +1. PT Short Term Goal 2 (Week 1): Pt will transfer sit<>stand with max assist +1. PT Short Term Goal 3 (Week 1): Pt will transfer bed<>w/c with max assist +1.  Skilled Therapeutic Interventions/Progress Updates:    Pt up in TIS w/c upon arrival, agreeable to PT session. Transfers: sit<>stand performed with stedy and max-total assist. Unable to achieve standing on first attempt but able to achieve on second with facilitation and assist provided at pelvis. Ronnald Nian helping with folding pads down to sit. In sitting position working on midline orientation (leaning Lt) with mirror for visual feedback. Reaching with single and double hands at variable angles. Pt returned to TIS w/c after session with family present and all needs in reach. Pt engaged throughout session.   Therapy Documentation Precautions:  Precautions Precautions: Fall Precaution Comments: monitor O2, pt has LUE DVT (brachial) & currently not candidate for anticoagulation, recent hx of traumatic R SDH Restrictions Weight Bearing Restrictions: No  Pain:  Denies pain  See Function Navigator for Current Functional Status.   Therapy/Group: Individual Therapy  Delton See, PT 02/05/2017, 3:54 PM

## 2017-02-05 NOTE — Progress Notes (Signed)
Social Work Patient ID: Alex [name], male   DOB: 1931-01-02, 83 y.o.   MRN: [id number]   Have reveiwed team conference with pt, wife and "daughter", French Ana.  All aware and agreeable with targeted d/c date of 5/4 and min assist goals overall.  Pleased with LOS and hopeful he will make needed progress.  Wife "so happy he was able to come back here ([facility])."  Continue to follow.  Nathalya Wolanski, LCSW

## 2017-02-05 NOTE — Progress Notes (Signed)
[place] PHYSICAL MEDICINE & REHABILITATION     PROGRESS NOTE    Subjective/Complaints: Had a reasonable night. Eating breakfast. Felt tired yesterday  Objective: Vital Signs: Blood pressure 106/63, pulse 66, temperature 97.5 F (36.4 C), temperature source Oral, resp. rate 18, weight 106.9 kg (235 lb 10.8 oz), SpO2 93 %. No results found. No results for input(s): WBC, HGB, HCT, PLT in the last 72 hours. No results for input(s): NA, K, CL, GLUCOSE, BUN, CREATININE, CALCIUM in the last 72 hours.  Invalid input(s): CO CBG (last 3)  No results for input(s): GLUCAP in the last 72 hours.  Wt Readings from Last 3 Encounters:  02/05/17 106.9 kg (235 lb 10.8 oz)  02/01/17 106 kg (233 lb 11 oz)  01/10/17 91.8 kg (202 lb 6.1 oz)    Physical Exam:  Constitutional: He is oriented to person, place, and time. He appears well-developed.  Obese  HENT:  Head: Normocephalic. External ears normal. Eyes: Conjunctivae and EOM are normal. Right eye exhibits no discharge. Left eye exhibits no discharge.  Neck: Normal range of motion. Neck supple.  Cardiovascular:  IRR/IRR Respiratory:clear bilat. No distress.  With [place]  GI: Soft. Bowel sounds are normal.  Musculoskeletal: He exhibits 2+ edema LUE, non tender, no warmth   Neurological: He is alert and oriented to person, place, and time.  Motor: 4-/5 UE. 3/5 HF,KE 4/5 distal.  Sensation intact to light touch  Skin: Skin is warm and dry.  Psychiatric: He has a normal mood and affect. His behavior is normal.   Assessment/Plan: 1. Functional deficits secondary to debility after sepsis/multiple medical which require 3+ hours per day of interdisciplinary therapy in a comprehensive inpatient rehab setting. Physiatrist is providing close team supervision and 24 hour management of active medical problems listed below. Physiatrist and rehab team continue to assess barriers to discharge/monitor patient progress toward functional and medical  goals.  Function:  Bathing Bathing position   Position: Bed (and w/c at sink (UB))  Bathing parts Body parts bathed by patient: Right arm, Left arm, Chest, Abdomen Body parts bathed by helper: Front perineal area, Buttocks, Right upper leg, Left upper leg, Right lower leg, Left lower leg  Bathing assist Assist Level:  (patient completed 20%, total assist)      Upper Body Dressing/Undressing Upper body dressing   What is the patient wearing?: Pull over shirt/dress     Pull over shirt/dress - Perfomed by patient: Thread/unthread right sleeve Pull over shirt/dress - Perfomed by helper: Thread/unthread left sleeve, Put head through opening, Pull shirt over trunk        Upper body assist Assist Level:  (Max Assist)      Lower Body Dressing/Undressing Lower body dressing   What is the patient wearing?: Underwear, Pants, Non-skid slipper socks   Underwear - Performed by helper: Thread/unthread right underwear leg, Thread/unthread left underwear leg, Pull underwear up/down   Pants- Performed by helper: Thread/unthread right pants leg, Thread/unthread left pants leg, Pull pants up/down   Non-skid slipper socks- Performed by helper: Don/doff right sock, Don/doff left sock                  Lower body assist Assist for lower body dressing:  (Max Assist)      Toileting Toileting Toileting activity did not occur: No continent bowel/bladder event Toileting steps completed by patient: Adjust clothing prior to toileting Toileting steps completed by helper: Adjust clothing prior to toileting, Performs perineal hygiene, Adjust clothing after toileting  Toileting assist     Transfers Chair/bed transfer   Chair/bed transfer method: Squat pivot Chair/bed transfer assist level: 2 helpers Chair/bed transfer assistive device: Mechanical lift Mechanical lift: Maximove   Locomotion Ambulation Ambulation activity did not occur: Safety/medical concerns         Wheelchair    Type: Manual Max wheelchair distance: 30 ft (BLE) Assist Level: Maximal assistance (Pt 25 - 49%)  Cognition Comprehension Comprehension assist level: Follows basic conversation/direction with no assist  Expression Expression assist level: Expresses basic needs/ideas: With extra time/assistive device  Social Interaction Social Interaction assist level: Interacts appropriately 75 - 89% of the time - Needs redirection for appropriate language or to initiate interaction.  Problem Solving Problem solving assist level: Solves basic 75 - 89% of the time/requires cueing 10 - 24% of the time  Memory Memory assist level: Recognizes or recalls 50 - 74% of the time/requires cueing 25 - 49% of the time   Medical Problem List and Plan: 1.  Debilitation secondary to sepsis as well as history of bilateral SDH status post craniotomy 12/16/2016  -continue therapies  -pt expresses desire to get home. We reviewed the fact that he will have to work through some of the fatigue and pain to do so. I have adjusted pain medication as below. 2.  DVT Prophylaxis/Anticoagulation: SCDs. Monitor for any signs of DVT 3. Pain Management: PRN oxycodone  -will schedule oxycodone and tyleno prior therapy each am and at noon 4. Mood: Xanax 0.25 mg daily at bedtime, Lexapro 10 mg daily at bedtime  -denies depression at present  -neuropsych following.  5. Neuropsych: This patient is capable of making decisions on his own behalf. 6. Skin/Wound Care: Routine skin checks 7. Fluids/Electrolytes/Nutrition: encourage PO   8. Seizure prophylaxis. Keppra 500 mg twice a day 9. ID/bacteremia. Septra since 4/8 10. COPD/OSA. Check oxygen saturations every shift 11. Atrial fibrillation/AVR/CABG/PPM. Continue low-dose aspirin as well as amiodarone 200 mg twice a day. Chronic Coumadin discontinued due to subdural hematomas 12. Diastolic congestive heart failure. Lasix 20 mg daily. Monitor for any signs of fluid overload  -need daily  weights!!--stable at 106.9 13. Hypertension. Currently with hypotension. Toprol-XL 12.5 mg daily, will holding presently 14. Hyperlipidemia. Lipitor 15. Prostatic abscess: S/p TURP. Cont abx 16. ABLA: Monitor CBC 17. LUE DVT: Brachial vein. Not an anticoagulation candidate due to recent subdural hematoma and craniotomy  -continue to elevate/conservative care--stable to improved   LOS (Days) 4 A FACE TO FACE EVALUATION WAS PERFORMED  [name], MD 02/05/2017 8:36 AM

## 2017-02-05 NOTE — Progress Notes (Signed)
Occupational Therapy Note  Patient Details  Name: MAUREEN DUESING MRN: [id number] Date of Birth: January 24, 1931  Today's Date: 02/05/2017 OT Individual Time: 1330-1400 OT Individual Time Calculation (min): 30 min   Pt denied pain Individual Therapy  Pt resting in w/c upon arrival.  Pt engaged in doffing soiled shirt and donning clean shirt while seated in w/c at sink.  Pt initiated doffing shirt by reaching behind his head and pulling shirt up from back.  Pt required assistance to pull shirt over head but was able to pull shirt over arms.  Pt required rest break after doffing shirt.  Pt initiated donning shirt when presented but required assistance with orientation of shirt.  Pt required assistance with threading BUE and pulling over head but was able to pull over trunk.  Pt requires more than a reasonable amount of time with multiple rest breaks.  Pt remained in w/c with family present.    Lavone Neri [facility] 02/05/2017, 2:51 PM

## 2017-02-05 NOTE — Progress Notes (Signed)
Occupational Therapy Session Note  Patient Details  Name: Alex [name] MRN: [id number] Date of Birth: [birth year]  Today's Date: 02/05/2017 OT Individual Time: 0930-1030 OT Individual Time Calculation (min): 60 min    Short Term Goals: Week 1:  OT Short Term Goal 1 (Week 1): Pt will perform stand pivot transfer with mod A +1 to BSC/ toilet OT Short Term Goal 2 (Week 1): Pt will perform sit to stand for clothing management with  mod A  OT Short Term Goal 4 (Week 1): Pt will be oriented x4 with mod external cues  Skilled Therapeutic Interventions/Progress Updates:    Pt engaged in BADL retraining with focus on bed mobility, task initiation, sequencing, and activity tolerance.  Pt engaged in LB bathing/dressing tasks at bed level with focus on bed mobility and task initiation to bathe B upper legs and perineal area.  Pt initiated rolling in bed with use of bed rails and required mod A to complete task. Pt was able to lift BLE off bed to facilitate threading of pants.  Pt transferred to w/c with [facility] and completed UB bathing/dressing tasks at sink.  Pt required increased assistance secondary to time constraints.  Pt remained in w/c with all needs within reach.   Therapy Documentation Precautions:  Precautions Precautions: Fall Precaution Comments: monitor O2, pt has LUE DVT (brachial) & currently not candidate for anticoagulation, recent hx of traumatic R SDH Restrictions Weight Bearing Restrictions: No Pain:  Pt denied pain  See Function Navigator for Current Functional Status.   Therapy/Group: Individual Therapy  [name] 02/05/2017, 2:56 PM

## 2017-02-06 ENCOUNTER — Inpatient Hospital Stay (HOSPITAL_COMMUNITY): Payer: Medicare Other

## 2017-02-06 ENCOUNTER — Ambulatory Visit (HOSPITAL_COMMUNITY): Payer: Medicare Other | Admitting: Physical Therapy

## 2017-02-06 ENCOUNTER — Inpatient Hospital Stay (HOSPITAL_COMMUNITY): Payer: Medicare Other | Admitting: Physical Therapy

## 2017-02-06 NOTE — Progress Notes (Signed)
Physical Therapy Session Note  Patient Details  Name: Alex [name] MRN: [id number] Date of Birth: 1930/11/02  Today's Date: 02/05/2017 PT Individual Time:1505-1600      Short Term Goals: Week 1:  PT Short Term Goal 1 (Week 1): Pt will perform bed mobility with mod assist +1. PT Short Term Goal 2 (Week 1): Pt will transfer sit<>stand with max assist +1. PT Short Term Goal 3 (Week 1): Pt will transfer bed<>w/c with max assist +1.  Skilled Therapeutic Interventions/Progress Updates:   Pt received sitting in WC and agreeable to PT. PT treatment focused on strengthening BLE and UE. Pt transported in [place] with to main entrance of hospital. PT instructed pt in seated BLE strengthening exercises with manual resistance. All completed 2 sets of 12 BLE.  Reciprocal hip flexion,  Hip adduction hip abduction LAQ,  HS curls,  Calf raises.  Hip extention from flexion position  BUE therex completed sitting in TIS WC: 2 x 12 with AROM shoulder press,  Chest press,  Bicep curls,  tricep extension.   Anterior/posterior scooting in chair x 5 with mod assist from PT and min cues for proper UE placement and safety.   Patient returned too room and left sitting in [facility] with call bell in reach and all needs met.           Therapy Documentation Precautions:  Precautions Precautions: Fall Precaution Comments: monitor O2, pt has LUE DVT (brachial) & currently not candidate for anticoagulation, recent hx of traumatic R SDH Restrictions Weight Bearing Restrictions: No General:   Vital Signs: Therapy Vitals Temp: 98.4 F (36.9 C) Temp Source: Oral Pulse Rate: 81 Resp: 17 BP: 92/64 Patient Position (if appropriate): Lying Oxygen Therapy SpO2: 96 % O2 Device: Nasal Cannula 2L/nim   See Function Navigator for Current Functional Status.   Therapy/Group: Individual Therapy  Lorie Phenix 02/06/2017, 6:15 AM

## 2017-02-06 NOTE — Progress Notes (Signed)
Occupational Therapy Session Note  Patient Details  Name: Alex [name] MRN: [id number] Date of Birth: 1931-10-25  Today's Date: 02/06/2017 OT Individual Time: 5956-3875 OT Individual Time Calculation (min): 44 min    Short Term Goals: Week 1:  OT Short Term Goal 1 (Week 1): Pt will perform stand pivot transfer with mod A +1 to BSC/ toilet OT Short Term Goal 2 (Week 1): Pt will perform sit to stand for clothing management with  mod A  OT Short Term Goal 4 (Week 1): Pt will be oriented x4 with mod external cues  Skilled Therapeutic Interventions/Progress Updates:    Pt seen seated in bed with HOB elevated, no complaint of pain. Pt requests to finish breakfast with increased time. Noted increased tremors in R hand and applied 1/2# weight to wrist with no significant decrease in tremors noted at this time. Attempted to trial plate guard d/t food being pushed to edge of plate, however plate guard is too small. Pt required VC to swallow all food before taking another bite and to wipe L side of mouth as pt is unaware of food on L side of face. To don pants pt able to lift LE ~10 times to improve hip flexor strength and A OT to thread pants/advance pants up leg. Pt rolls B with MOD A and VC for technique for OT to advance pants up hips. Sits EOB ~10 min MIN-MOD A for midline orientation in order to don shirt. Pt requires VC to weight shift and attend to task. MD enters room and pt unable to divide attention between donning shirt and talking with MD. Pt able to thread BUE into shirt and head with Vc for sequencing, but require A for pulling shirt over trunk. Pt decline resting in TIS chair and request return to supine with MOD A for leg lifting. Exited session with bed exit alarm on, call light in reach and all needs met.  Therapy Documentation Precautions:  Precautions Precautions: Fall Precaution Comments: monitor O2, pt has LUE DVT (brachial) & currently not candidate for anticoagulation, recent hx of  traumatic R SDH Restrictions Weight Bearing Restrictions: No    Other Treatments:    See Function Navigator for Current Functional Status.   Therapy/Group: Individual Therapy  Tonny Branch 02/06/2017, 8:03 AM

## 2017-02-06 NOTE — Progress Notes (Signed)
Physical Therapy Session Note  Patient Details  Name: Alex [name] MRN: [id number] Date of Birth: 1931-09-03  Today's Date: 02/06/2017 PT Individual Time: 1305-1400 PT Individual Time Calculation (min): 55 min   Short Term Goals: Week 1:  PT Short Term Goal 1 (Week 1): Pt will perform bed mobility with mod assist +1. PT Short Term Goal 2 (Week 1): Pt will transfer sit<>stand with max assist +1. PT Short Term Goal 3 (Week 1): Pt will transfer bed<>w/c with max assist +1.  Skilled Therapeutic Interventions/Progress Updates:   Patient in TIS wheelchair upon arrival with wife departing. Patient on 2 L 02 via [place] throughout session. Patient reporting buttock discomfort from sitting upright and tilted back in wheelchair for pressure relief. In therapy gym, attempted sit <> stand from wheelchair with parallel bar in front of patient with multiple techniques (LUE around therapist, pushing up with R/L UE from arm rest) with +2A. Patient able to clear buttocks but unable to shift COG over BOS in order to elevate head and trunk and achieve complete stand. Utilized standing frame with vitals: resting seated BP 102/61, HR 97, Sp02 91%, standing BP 79/26 and Sp02 74-81%, and immediately returned to sitting with BP 100/64, 98% Sp02, and HR 95. Patient's pants noted to be wet, returned to bed via [place] with +2A. Doffed soiled clothing, performed hygiene, and donned clean brief with total A and patient rolling R and L with min A using bed rails after nurse tech reattached condom catheter. Patient left semi reclined in bed with 3 rails up and bed alarm on, needs in reach and son at bedside.    Therapy Documentation Precautions:  Precautions Precautions: Fall Precaution Comments: monitor O2, pt has LUE DVT (brachial) & currently not candidate for anticoagulation, recent hx of traumatic R SDH Restrictions Weight Bearing Restrictions: No Pain: Pain Assessment Pain Assessment: No/denies pain  See Function  Navigator for Current Functional Status.   Therapy/Group: Individual Therapy  Emileigh Kellett, Prudencio Pair 02/06/2017, 2:46 PM

## 2017-02-06 NOTE — Progress Notes (Signed)
[facility] PHYSICAL MEDICINE & REHABILITATION     PROGRESS NOTE    Subjective/Complaints: Back feeling a little better. Up with OT. No problems overnight  ROS: pt denies nausea, vomiting, diarrhea, cough, shortness of breath or chest pain   Objective: Vital Signs: Blood pressure 92/64, pulse 81, temperature 98.4 F (36.9 C), temperature source Oral, resp. rate 17, weight 103.8 kg (228 lb 13.4 oz), SpO2 96 %. No results found. No results for input(s): WBC, HGB, HCT, PLT in the last 72 hours. No results for input(s): NA, K, CL, GLUCOSE, BUN, CREATININE, CALCIUM in the last 72 hours.  Invalid input(s): CO CBG (last 3)  No results for input(s): GLUCAP in the last 72 hours.  Wt Readings from Last 3 Encounters:  02/06/17 103.8 kg (228 lb 13.4 oz)  02/01/17 106 kg (233 lb 11 oz)  01/10/17 91.8 kg (202 lb 6.1 oz)    Physical Exam:  Constitutional: He is oriented to person, place, and time. He appears well-developed.  Obese  HENT:  Head: Normocephalic. External ears normal. Eyes: Conjunctivae and EOM are normal. Right eye exhibits no discharge. Left eye exhibits no discharge.  Neck: Normal range of motion. Neck supple.  Cardiovascular:  IRR/IRR Respiratory: clear GI: Soft. Bowel sounds are normal.  Musculoskeletal: He exhibits 1+ edema LUE    Neurological: He is alert and oriented to person, place, and time.  Motor: 4-/5 UE. 3/5 HF,KE 4/5 distal.  Sensation intact to light touch  Skin: Skin is warm and dry.  Psychiatric: He has a normal mood and affect. His behavior is normal.   Assessment/Plan: 1. Functional deficits secondary to debility after sepsis/multiple medical which require 3+ hours per day of interdisciplinary therapy in a comprehensive inpatient rehab setting. Physiatrist is providing close team supervision and 24 hour management of active medical problems listed below. Physiatrist and rehab team continue to assess barriers to discharge/monitor patient progress  toward functional and medical goals.  Function:  Bathing Bathing position   Position: Bed (and w/c at sink (UB))  Bathing parts Body parts bathed by patient: Right arm, Left arm, Chest, Abdomen Body parts bathed by helper: Front perineal area, Buttocks, Right upper leg, Left upper leg, Right lower leg, Left lower leg  Bathing assist Assist Level:  (patient completed 20%, total assist)      Upper Body Dressing/Undressing Upper body dressing   What is the patient wearing?: Pull over shirt/dress     Pull over shirt/dress - Perfomed by patient: Thread/unthread right sleeve Pull over shirt/dress - Perfomed by helper: Thread/unthread left sleeve, Put head through opening, Pull shirt over trunk        Upper body assist Assist Level:  (Max Assist)      Lower Body Dressing/Undressing Lower body dressing   What is the patient wearing?: Underwear, Pants, Non-skid slipper socks   Underwear - Performed by helper: Thread/unthread right underwear leg, Thread/unthread left underwear leg, Pull underwear up/down   Pants- Performed by helper: Thread/unthread right pants leg, Thread/unthread left pants leg, Pull pants up/down   Non-skid slipper socks- Performed by helper: Don/doff right sock, Don/doff left sock                  Lower body assist Assist for lower body dressing:  (Max Assist)      Toileting Toileting Toileting activity did not occur: No continent bowel/bladder event Toileting steps completed by patient: Adjust clothing prior to toileting Toileting steps completed by helper: Adjust clothing prior to toileting, Performs perineal  hygiene, Adjust clothing after toileting Toileting Assistive Devices: Grab bar or rail  Toileting assist Assist level: Touching or steadying assistance (Pt.75%)   Transfers Chair/bed transfer   Chair/bed transfer method: Squat pivot Chair/bed transfer assist level: Total assist (Pt < 25%) Chair/bed transfer assistive device: Mechanical  lift Mechanical lift: Stedy   Locomotion Ambulation Ambulation activity did not occur: Safety/medical concerns         Wheelchair   Type: Manual Max wheelchair distance: 30 ft (BLE) Assist Level: Maximal assistance (Pt 25 - 49%)  Cognition Comprehension Comprehension assist level: Follows basic conversation/direction with no assist  Expression Expression assist level: Expresses basic needs/ideas: With no assist  Social Interaction Social Interaction assist level: Interacts appropriately 75 - 89% of the time - Needs redirection for appropriate language or to initiate interaction.  Problem Solving Problem solving assist level: Solves basic 75 - 89% of the time/requires cueing 10 - 24% of the time  Memory Memory assist level: Recognizes or recalls 50 - 74% of the time/requires cueing 25 - 49% of the time   Medical Problem List and Plan: 1.  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  DVT Prophylaxis/Anticoagulation: SCDs. Monitor for any signs of DVT 3. Pain Management: PRN oxycodone  -will schedule oxycodone and tyleno prior therapy each am and at noon 4. Mood: Xanax 0.25 mg daily at bedtime, Lexapro 10 mg daily at bedtime  -denies depression at present  -neuropsych following.   -continue to provide emotional support as possible 5. Neuropsych: This patient is capable of making decisions on his own behalf. 6. Skin/Wound Care: Routine skin checks 7. Fluids/Electrolytes/Nutrition: encourage PO   8. Seizure prophylaxis. Keppra 500 mg twice a day 9. ID/bacteremia. Septra since 4/8 10. COPD/OSA. Check oxygen saturations every shift 11. Atrial fibrillation/AVR/CABG/PPM. Continue low-dose aspirin as well as amiodarone 200 mg twice a day. Chronic Coumadin discontinued due to subdural hematomas 12. Diastolic congestive heart failure. Lasix 20 mg daily. Monitor for any signs of fluid  overload  -daily weights---weight dropped today but don't think this was accurate 13. Hypertension. Currently with hypotension. Toprol-XL 12.5 mg daily, will holding presently 14. Hyperlipidemia. Lipitor 15. Prostatic abscess: S/p TURP. Cont abx 16. ABLA: Monitor CBC 17. LUE DVT: Brachial vein. Not an anticoagulation candidate due to recent subdural hematoma and craniotomy  -continue to elevate/conservative care--edema much improved   LOS (Days) 5 A FACE TO FACE EVALUATION WAS PERFORMED  [name], MD 02/06/2017 8:37 AM

## 2017-02-06 NOTE — Progress Notes (Signed)
Occupational Therapy Session Note  Patient Details  Name: Alex [name] MRN: [id number] Date of Birth: 01-May-1931  Today's Date: 02/06/2017 OT Individual Time: 1000-1100 OT Individual Time Calculation (min): 60 min    Short Term Goals: Week 1:  OT Short Term Goal 1 (Week 1): Pt will perform stand pivot transfer with mod A +1 to BSC/ toilet OT Short Term Goal 2 (Week 1): Pt will perform sit to stand for clothing management with  mod A  OT Short Term Goal 4 (Week 1): Pt will be oriented x4 with mod external cues  Skilled Therapeutic Interventions/Progress Updates:    Pt resting in bed upon arrival.  Pt donned clean clothing earlier in the morning but agreeable to changing shirts once up in w/c.  Pt initially engaged in bed mobility (rolling) to facilitate checking pt's brief for BM.  Pt able to roll R and L with min A using bed rails.  Pt transferred to w/c with Maxi Move.  Pt doffed shirt and donned clean shirt with mod A after orientation of shirt.  Pt also engaged in grooming tasks at sink.  Pt required mod A for shaving tasks.  Pt requires mod verbal cues for attention to tasks.  Focus on activity tolerance, bed mobility, sitting balance, task initiation, sequencing, attention to task, and safety awareness to increase independence with BADLs.  Therapy Documentation Precautions:  Precautions Precautions: Fall Precaution Comments: monitor O2, pt has LUE DVT (brachial) & currently not candidate for anticoagulation, recent hx of traumatic R SDH Restrictions Weight Bearing Restrictions: No Pain:  Pt denied pain ADL: ADL ADL Comments: See functional assessment tool  See Function Navigator for Current Functional Status.   Therapy/Group: Individual Therapy  [name] 02/06/2017, 12:17 PM

## 2017-02-07 ENCOUNTER — Inpatient Hospital Stay (HOSPITAL_COMMUNITY): Payer: Medicare Other | Admitting: Occupational Therapy

## 2017-02-07 NOTE — Plan of Care (Signed)
Problem: RH BOWEL ELIMINATION Goal: RH STG MANAGE BOWEL WITH ASSISTANCE STG Manage Bowel with Assistance. Mod assist  Outcome: Progressing LBM 4/10; need laxatives today if no bm  Problem: RH BLADDER ELIMINATION Goal: RH STG MANAGE BLADDER WITH ASSISTANCE STG Manage Bladder With Assistance. Mod  Outcome: Not Progressing Condom cath in use

## 2017-02-07 NOTE — Progress Notes (Signed)
Occupational Therapy Session Note  Patient Details  Name: Alex [name] MRN: [id number] Date of Birth: [birth year]  Today's Date: 02/07/2017 OT Individual Time: 1345-1500 OT Individual Time Calculation (min): 75 min    Short Term Goals: Week 1:  OT Short Term Goal 1 (Week 1): Pt will perform stand pivot transfer with mod A +1 to BSC/ toilet OT Short Term Goal 2 (Week 1): Pt will perform sit to stand for clothing management with  mod A  OT Short Term Goal 4 (Week 1): Pt will be oriented x4 with mod external cues  Skilled Therapeutic Interventions/Progress Updates: Patient pants were wet upon approach for this session.   Evidently his condom catheter had come partially off.    This clinician assisted patient with doffing pants, cleansing, bed mobility and activity tolerance.  Patient was able to sit edge of bed for endurance training (static sitting balance=poor + for approximately 2 minutes) though he stated he was fatigued.  At his request to ly back to rest, he was assisted with side lying on his right side.  He required max A for EOB to supine transfer and Moderate assist for bed mobility this session  Call bell and bed alarm were in place and engaged at the end of this session     Therapy Documentation Precautions:  Precautions Precautions: Fall Precaution Comments: monitor O2, pt has LUE DVT (brachial) & currently not candidate for anticoagulation, recent hx of traumatic R SDH Restrictions Weight Bearing Restrictions: No   Pain:denied    See Function Navigator for Current Functional Status.   Therapy/Group: Individual Therapy  Bud Face [facility] 02/07/2017, 8:25 PM

## 2017-02-07 NOTE — Progress Notes (Signed)
Occupational Therapy Session Note  Patient Details  Name: LERAY GARVERICK MRN: [id number] Date of Birth: 1931-07-14  Today's Date: 02/07/2017 OT Individual Time: 4098-1191 OT Individual Time Calculation (min): 75 min    Short Term Goals: Week 1:  OT Short Term Goal 1 (Week 1): Pt will perform stand pivot transfer with mod A +1 to BSC/ toilet OT Short Term Goal 2 (Week 1): Pt will perform sit to stand for clothing management with  mod A  OT Short Term Goal 4 (Week 1): Pt will be oriented x4 with mod external cues  Skilled Therapeutic Interventions/Progress Updates: Patient stated he was too fatigued to participate in OT this am but concurred to complete bathing and dressing supine with head of bed elevated and lateral rolls to don pants.   He required overall extra time and moderate assistance for bathing and dressing..    Patient very winded with all acivities this session.   He required moderate assistance for positionin in bed at the end of the session.     Therapy Documentation Precautions:  Precautions Precautions: Fall Precaution Comments: monitor O2, pt has LUE DVT (brachial) & currently not candidate for anticoagulation, recent hx of traumatic R SDH Restrictions Weight Bearing Restrictions: No  Pain:denied   See Function Navigator for Current Functional Status.   Therapy/Group: Individual Therapy  Bud Face [facility] 02/07/2017, 3:57 PM

## 2017-02-07 NOTE — Progress Notes (Signed)
[id number] 02/07/17 nursing MD notified to address patient's code status.

## 2017-02-07 NOTE — Progress Notes (Signed)
[place] PHYSICAL MEDICINE & REHABILITATION     PROGRESS NOTE    Subjective/Complaints: No problems overnight. Slept well. Back pain controlled at present  ROS: pt denies nausea, vomiting, diarrhea, cough, shortness of breath or chest pain   Objective: Vital Signs: Blood pressure (!) 106/51, pulse 70, temperature 97.5 F (36.4 C), temperature source Oral, resp. rate 18, weight 100.5 kg (221 lb 9 oz), SpO2 93 %. No results found. No results for input(s): WBC, HGB, HCT, PLT in the last 72 hours. No results for input(s): NA, K, CL, GLUCOSE, BUN, CREATININE, CALCIUM in the last 72 hours.  Invalid input(s): CO CBG (last 3)  No results for input(s): GLUCAP in the last 72 hours.  Wt Readings from Last 3 Encounters:  02/07/17 100.5 kg (221 lb 9 oz)  02/01/17 106 kg (233 lb 11 oz)  01/10/17 91.8 kg (202 lb 6.1 oz)    Physical Exam:  Constitutional: He is oriented to person, place, and time. He appears well-developed.  Obese  HENT:  Head: Normocephalic. External ears normal. Eyes: Conjunctivae and EOM are normal. Right eye exhibits no discharge. Left eye exhibits no discharge.  Neck: Normal range of motion. Neck supple.  Cardiovascular:  IRR/IRR Respiratory: clear GI: Soft. Bowel sounds are normal.  Musculoskeletal: He exhibits 1+ edema LUE    Neurological: He is alert and oriented to person, place, and time.  Motor: 4-/5 UE. 3/5 HF,KE 4/5 distal.  Sensation intact to light touch  Skin: Skin is warm and dry.  Psychiatric: He has a normal mood and affect. His behavior is normal.   Assessment/Plan: 1. Functional deficits secondary to debility after sepsis/multiple medical which require 3+ hours per day of interdisciplinary therapy in a comprehensive inpatient rehab setting. Physiatrist is providing close team supervision and 24 hour management of active medical problems listed below. Physiatrist and rehab team continue to assess barriers to discharge/monitor patient progress  toward functional and medical goals.  Function:  Bathing Bathing position   Position: Bed (and w/c at sink (UB))  Bathing parts Body parts bathed by patient: Right arm, Left arm, Chest, Abdomen Body parts bathed by helper: Front perineal area, Buttocks, Right upper leg, Left upper leg, Right lower leg, Left lower leg  Bathing assist Assist Level:  (patient completed 20%, total assist)      Upper Body Dressing/Undressing Upper body dressing   What is the patient wearing?: Pull over shirt/dress     Pull over shirt/dress - Perfomed by patient: Thread/unthread right sleeve, Put head through opening Pull over shirt/dress - Perfomed by helper: Pull shirt over trunk, Thread/unthread left sleeve        Upper body assist Assist Level:  (MOD A)      Lower Body Dressing/Undressing Lower body dressing   What is the patient wearing?: Pants   Underwear - Performed by helper: Thread/unthread right underwear leg, Thread/unthread left underwear leg, Pull underwear up/down   Pants- Performed by helper: Thread/unthread right pants leg, Pull pants up/down, Thread/unthread left pants leg   Non-skid slipper socks- Performed by helper: Don/doff right sock, Don/doff left sock                  Lower body assist Assist for lower body dressing:  (Max Assist)      Toileting Toileting Toileting activity did not occur: No continent bowel/bladder event Toileting steps completed by patient: Adjust clothing prior to toileting Toileting steps completed by helper: Adjust clothing prior to toileting, Performs perineal hygiene Toileting Assistive Devices:  Grab bar or rail  Toileting assist Assist level: Touching or steadying assistance (Pt.75%)   Transfers Chair/bed transfer   Chair/bed transfer method: Other Chair/bed transfer assist level: 2 helpers Chair/bed transfer assistive device: Mechanical lift Mechanical lift: Maximove   Locomotion Ambulation Ambulation activity did not occur:  Safety/medical concerns         Wheelchair   Type: Manual Max wheelchair distance: 30 ft (BLE) Assist Level: Dependent (Pt equals 0%)  Cognition Comprehension Comprehension assist level: Follows basic conversation/direction with no assist  Expression Expression assist level: Expresses complex ideas: With no assist  Social Interaction Social Interaction assist level: Interacts appropriately 75 - 89% of the time - Needs redirection for appropriate language or to initiate interaction.  Problem Solving Problem solving assist level: Solves basic problems with no assist  Memory Memory assist level: Recognizes or recalls 25 - 49% of the time/requires cueing 50 - 75% of the time   Medical Problem List and Plan: 1.  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  DVT Prophylaxis/Anticoagulation: SCDs. Monitor for any signs of DVT 3. Pain Management: PRN oxycodone  -scheduled oxycodone and tyleno prior therapy each am and at noon 4. Mood: Xanax 0.25 mg daily at bedtime, Lexapro 10 mg daily at bedtime  -denies depression at present  -neuropsych following.   -continue to provide emotional support as possible 5. Neuropsych: This patient is capable of making decisions on his own behalf. 6. Skin/Wound Care: Routine skin checks 7. Fluids/Electrolytes/Nutrition: encourage PO. Not eating consistently   8. Seizure prophylaxis. Keppra 500 mg twice a day 9. ID/bacteremia. Septra since 4/8 10. COPD/OSA. Check oxygen saturations every shift 11. Atrial fibrillation/AVR/CABG/PPM. Continue low-dose aspirin as well as amiodarone 200 mg twice a day. Chronic Coumadin discontinued due to subdural hematomas 12. Diastolic congestive heart failure. Lasix 20 mg daily. Monitor for any signs of fluid overload  -daily weights---weight dropped again today to 100kg  -intake up and down.  13. Hypertension. Currently with  hypotension. Toprol-XL 12.5 mg daily, will holding presently 14. Hyperlipidemia. Lipitor 15. Prostatic abscess: S/p TURP. Cont abx 16. ABLA: Monitor CBC 17. LUE DVT: Brachial vein. Not an anticoagulation candidate due to recent subdural hematoma and craniotomy  -continue to elevate/conservative care--  -edema improving  LOS (Days) 6 A FACE TO FACE EVALUATION WAS PERFORMED  Faith Rogue T, MD 02/07/2017 8:30 AM

## 2017-02-08 ENCOUNTER — Inpatient Hospital Stay (HOSPITAL_COMMUNITY): Payer: Medicare Other

## 2017-02-08 DIAGNOSIS — J42 Unspecified chronic bronchitis: Secondary | ICD-10-CM

## 2017-02-08 DIAGNOSIS — I480 Paroxysmal atrial fibrillation: Secondary | ICD-10-CM

## 2017-02-08 NOTE — Progress Notes (Signed)
[place] PHYSICAL MEDICINE & REHABILITATION     PROGRESS NOTE    Subjective/Complaints: Up eating breakfast. Denies new problems. Appetite good. "im ready to go home"  ROS: pt denies nausea, vomiting, diarrhea, cough, shortness of breath or chest pain    Objective: Vital Signs: Blood pressure 131/63, pulse 83, temperature 97.5 F (36.4 C), temperature source Oral, resp. rate 18, weight 99.8 kg (220 lb), SpO2 96 %. No results found. No results for input(s): WBC, HGB, HCT, PLT in the last 72 hours. No results for input(s): NA, K, CL, GLUCOSE, BUN, CREATININE, CALCIUM in the last 72 hours.  Invalid input(s): CO CBG (last 3)  No results for input(s): GLUCAP in the last 72 hours.  Wt Readings from Last 3 Encounters:  02/08/17 99.8 kg (220 lb)  02/01/17 106 kg (233 lb 11 oz)  01/10/17 91.8 kg (202 lb 6.1 oz)    Physical Exam:  Constitutional: He is oriented to person, place, and time. He appears well-developed.  Obese  HENT:  Head: Normocephalic. External ears normal. Eyes: Conjunctivae and EOM are normal. Right eye exhibits no discharge. Left eye exhibits no discharge.  Neck: Normal range of motion. Neck supple.  Cardiovascular:  IRR/IRR Respiratory: clear GI: Soft. Bowel sounds are normal.  Musculoskeletal: He exhibits 1+ edema LUE, no weeping seen    Neurological: He is alert and oriented to person, place, and time.  Motor: 4-/5 UE. 3/5 HF,KE 4/5 distal.  Sensation intact to light touch  Skin: Skin is warm and dry.  Psychiatric: He has a normal mood and affect. His behavior is normal.   Assessment/Plan: 1. Functional deficits secondary to debility after sepsis/multiple medical which require 3+ hours per day of interdisciplinary therapy in a comprehensive inpatient rehab setting. Physiatrist is providing close team supervision and 24 hour management of active medical problems listed below. Physiatrist and rehab team continue to assess barriers to discharge/monitor  patient progress toward functional and medical goals.  Function:  Bathing Bathing position   Position: Bed (and w/c at sink (UB))  Bathing parts Body parts bathed by patient: Right arm, Left arm, Chest, Abdomen Body parts bathed by helper: Front perineal area, Buttocks, Right upper leg, Left upper leg, Right lower leg, Left lower leg  Bathing assist Assist Level:  (patient completed 20%, total assist)      Upper Body Dressing/Undressing Upper body dressing   What is the patient wearing?: Pull over shirt/dress     Pull over shirt/dress - Perfomed by patient: Thread/unthread right sleeve, Put head through opening Pull over shirt/dress - Perfomed by helper: Pull shirt over trunk, Thread/unthread left sleeve        Upper body assist Assist Level:  (MOD A)      Lower Body Dressing/Undressing Lower body dressing   What is the patient wearing?: Pants   Underwear - Performed by helper: Thread/unthread right underwear leg, Thread/unthread left underwear leg, Pull underwear up/down   Pants- Performed by helper: Thread/unthread right pants leg, Pull pants up/down, Thread/unthread left pants leg   Non-skid slipper socks- Performed by helper: Don/doff right sock, Don/doff left sock                  Lower body assist Assist for lower body dressing:  (Max Assist)      Toileting Toileting Toileting activity did not occur: No continent bowel/bladder event Toileting steps completed by patient: Adjust clothing prior to toileting Toileting steps completed by helper: Adjust clothing prior to toileting, Performs perineal hygiene Toileting  Assistive Devices: Grab bar or rail  Toileting assist Assist level: Touching or steadying assistance (Pt.75%)   Transfers Chair/bed transfer   Chair/bed transfer method: Other Chair/bed transfer assist level: 2 helpers Chair/bed transfer assistive device: Mechanical lift Mechanical lift: Maximove   Locomotion Ambulation Ambulation activity did  not occur: Safety/medical concerns         Wheelchair   Type: Manual Max wheelchair distance: 30 ft (BLE) Assist Level: Dependent (Pt equals 0%)  Cognition Comprehension Comprehension assist level: Follows basic conversation/direction with no assist  Expression Expression assist level: Expresses basic needs/ideas: With no assist  Social Interaction Social Interaction assist level: Interacts appropriately 75 - 89% of the time - Needs redirection for appropriate language or to initiate interaction.  Problem Solving Problem solving assist level: Solves basic 75 - 89% of the time/requires cueing 10 - 24% of the time  Memory Memory assist level: Recognizes or recalls 50 - 74% of the time/requires cueing 25 - 49% of the time   Medical Problem List and Plan: 1.  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  DVT Prophylaxis/Anticoagulation: SCDs. Monitor for any signs of DVT 3. Pain Management: PRN oxycodone  -scheduled oxycodone and tylenol prior therapy each am and at noon with ?some improvement 4. Mood: Xanax 0.25 mg daily at bedtime, Lexapro 10 mg daily at bedtime  -denies depression at present  -neuropsych following.   -continue to provide emotional support as possible 5. Neuropsych: This patient is capable of making decisions on his own behalf. 6. Skin/Wound Care: Routine skin checks 7. Fluids/Electrolytes/Nutrition: encourage PO. Not eating consistently   8. Seizure prophylaxis. Keppra 500 mg twice a day 9. ID/bacteremia. Septra since 4/8 10. COPD/OSA. Check oxygen saturations every shift 11. Atrial fibrillation/AVR/CABG/PPM. Continue low-dose aspirin as well as amiodarone 200 mg twice a day. Chronic Coumadin discontinued due to subdural hematomas 12. Diastolic congestive heart failure. Lasix 20 mg daily. Monitor for any signs of fluid overload  -daily weights---weight at 100kg  currently  -intake can fluctuate.  13. Hypertension. Currently with hypotension. Toprol-XL 12.5 mg daily, will holding presently 14. Hyperlipidemia. Lipitor 15. Prostatic abscess: S/p TURP. Cont abx 16. ABLA: Monitor CBC 17. LUE DVT: Brachial vein. Not an anticoagulation candidate due to recent subdural hematoma and craniotomy  -continue to elevate/conservative care--  -edema improving  LOS (Days) 7 A FACE TO FACE EVALUATION WAS PERFORMED  [name], MD 02/08/2017 8:58 AM

## 2017-02-08 NOTE — Progress Notes (Signed)
Occupational Therapy Session Note  Patient Details  Name: Alex [name] MRN: [id number] Date of Birth: 1931-08-25  Today's Date: 02/08/2017 OT Individual Time: 1335-1430 OT Individual Time Calculation (min): 55 min    Short Term Goals: Week 1:  OT Short Term Goal 1 (Week 1): Pt will perform stand pivot transfer with mod A +1 to BSC/ toilet OT Short Term Goal 2 (Week 1): Pt will perform sit to stand for clothing management with  mod A  OT Short Term Goal 4 (Week 1): Pt will be oriented x4 with mod external cues  Skilled Therapeutic Interventions/Progress Updates:   1:1. No pain reported. Pt reporting fatigue, however willing to participate needing extended rest breaks between activities. Pt transfer out of bed with use of maxi move. Pt able to roll B with MIN A throughout session to place/remove sling. Pt sitting in w/c changes shirt with A to orient shirt, pull shirt down trunk and thread shirt over head. Pt grooms at sink to brush teeth and hair and wash face after lunch. Pt returns to bed in same manner as above and sits EOB with MIN A for midline orientation and reaches for targets ouside base of support. No LOB noted. While in supine pt completes 5x5 reps of bridges and lateral leg raises with VC for technique and breathing from therapist to strength LB muscles in prep for functional transfers. Pt attempt to stand in steady with MAX A from OT, but unable to fully power up to complete stand. Pt able to hold mini squat for 2 reps for 4 seconds. Exited session with pt supine in bed, bed exit alarm on and call light in place.   Therapy Documentation Precautions:  Precautions Precautions: Fall Precaution Comments: monitor O2, pt has LUE DVT (brachial) & currently not candidate for anticoagulation, recent hx of traumatic R SDH Restrictions Weight Bearing Restrictions: No  See Function Navigator for Current Functional Status.   Therapy/Group: Individual Therapy  Shon Hale 02/08/2017, 3:21 PM

## 2017-02-08 NOTE — Progress Notes (Signed)
Occupational Therapy Session Note  Patient Details  Name: Alex [name] MRN: [id number] Date of Birth: Nov 13, 1930  Today's Date: 02/08/2017 OT Individual Time: 3267-1245 OT Individual Time Calculation (min): 74 min    Short Term Goals: Week 1:  OT Short Term Goal 1 (Week 1): Pt will perform stand pivot transfer with mod A +1 to BSC/ toilet OT Short Term Goal 2 (Week 1): Pt will perform sit to stand for clothing management with  mod A  OT Short Term Goal 4 (Week 1): Pt will be oriented x4 with mod external cues  Skilled Therapeutic Interventions/Progress Updates:    1:1. No pain reported. Focus on ADL retraining. Pt request to eat breakfast but agreeable to sitting EOB to work on trunk control. Pt supine>EOB with MOD A for trunk and LLE facilitation. Pt sits EOB with MIN-MOD A and VC for weight shifting. Pt has one posterior LOB with total A to recover. Pt bathes seated EOB to wash UB and tops of legs. Pt requires VC to look to L to find wash cloth. Water and washclothes placed on L. Pt dons pull over shirt in sitting with A to orient shirt and pull shirt over head/ down trunk. Pt in supine OT provided total A to don pants, TED hose, and non slip socks. With VC for technique and foot stabilization, Pt completes 3x5 reps bridging hips with ~3 inches of butt clearance in order to strengthen muscles needed for functional transfers and ADLS. OT able to advance pants over hips while pt bridges. Pt transfers to [place] chair with TOTAL A and use of maxi move. Pt brushes teeth at sink with set up and increased time. Exited session with QRB donned, call light in place and all needs met.   Therapy Documentation Precautions:  Precautions Precautions: Fall Precaution Comments: monitor O2, pt has LUE DVT (brachial) & currently not candidate for anticoagulation, recent hx of traumatic R SDH Restrictions Weight Bearing Restrictions: No General:    See Function Navigator for Current Functional  Status.   Therapy/Group: Individual Therapy  Tonny Branch 02/08/2017, 10:09 AM

## 2017-02-09 ENCOUNTER — Inpatient Hospital Stay (HOSPITAL_COMMUNITY): Payer: Medicare Other | Admitting: Occupational Therapy

## 2017-02-09 ENCOUNTER — Inpatient Hospital Stay (HOSPITAL_COMMUNITY): Payer: Medicare Other | Admitting: Physical Therapy

## 2017-02-09 LAB — COMPREHENSIVE METABOLIC PANEL
ALBUMIN: 1.8 g/dL — AB (ref 3.5–5.0)
ALK PHOS: 95 U/L (ref 38–126)
ALT: 28 U/L (ref 17–63)
ANION GAP: 8 (ref 5–15)
AST: 34 U/L (ref 15–41)
BUN: 13 mg/dL (ref 6–20)
CALCIUM: 8 mg/dL — AB (ref 8.9–10.3)
CO2: 30 mmol/L (ref 22–32)
Chloride: 100 mmol/L — ABNORMAL LOW (ref 101–111)
Creatinine, Ser: 1.12 mg/dL (ref 0.61–1.24)
GFR calc Af Amer: >60 mL/min (ref >60)
GFR calc non Af Amer: 58 mL/min — ABNORMAL LOW (ref >60)
GLUCOSE: 84 mg/dL (ref 65–99)
Potassium: 4.4 mmol/L (ref 3.5–5.1)
Sodium: 138 mmol/L (ref 135–145)
Total Bilirubin: 0.5 mg/dL (ref 0.3–1.2)
Total Protein: 5.3 g/dL — ABNORMAL LOW (ref 6.5–8.1)

## 2017-02-09 LAB — CBC
HCT: 32.2 % — ABNORMAL LOW (ref 39.0–52.0)
HEMOGLOBIN: 10.1 g/dL — AB (ref 13.0–17.0)
MCH: 30.5 pg (ref 26.0–34.0)
MCHC: 31.4 g/dL (ref 30.0–36.0)
MCV: 97.3 fL (ref 78.0–100.0)
Platelets: 242 10*3/uL (ref 150–400)
RBC: 3.31 MIL/uL — ABNORMAL LOW (ref 4.22–5.81)
RDW: 16.8 % — ABNORMAL HIGH (ref 11.5–15.5)
WBC: 9.3 10*3/uL (ref 4.0–10.5)

## 2017-02-09 NOTE — Progress Notes (Signed)
Physical Therapy Session Note  Patient Details  Name: Alex [name] MRN: [id number] Date of Birth: Jul 11, 1931  Today's Date: 02/09/2017 PT Individual Time: 4098-1191 PT Individual Time Calculation (min): 62 min   Short Term Goals: Week 1:  PT Short Term Goal 1 (Week 1): Pt will perform bed mobility with mod assist +1. PT Short Term Goal 2 (Week 1): Pt will transfer sit<>stand with max assist +1. PT Short Term Goal 3 (Week 1): Pt will transfer bed<>w/c with max assist +1.  Skilled Therapeutic Interventions/Progress Updates:  Pt received in bed & agreeable to tx. Pt observed to be incontinent of urine & pt unaware. Pt rolled L<>R in bed with multimodal cuing for technique and mod assist to allow PT & NT to perform peri hygiene total assist. Pt required total assist to don clean pants for time management. Pt transferred bed>w/c via maxi move and transported to day room total assist. In day room pt transferred w/c<>tilt table with maxi move withy focus of activity being upright tolerance and weight bearing through BLE. Pt's vitals during tilt table activity are as follows:  Sitting in w/c: HR = 63-69 bpm, SpO2 = 97% 2L/min, BP = 102/53 mmHg Tilt table at 30 degrees at 0 minutes: HR = 56 bpm, BP = 90/45 mmHg Tilt table at 30 degrees at ~2 minutes: HR = 60 bpm, BP = 79/46 mmHg Returned to 0 degrees at 0 minutes: HR = 61 bpm, BP = 90/53 mmHg Tilt table at 0 degrees at ~2 minutes: HR = 73 bpm, BP = 94/57 mmhg Sitting in w/c: HR = 72 bpm, BP = 94/57 mmHg  Pt asymptomatic throughout activity and pt on tilt table for ~15 minutes total.  At end of session pt left sitting in w/c in room with QRB donned, all needs within reach, & visitors present to supervise.   Therapy Documentation Precautions:  Precautions Precautions: Fall Precaution Comments: monitor O2, pt has LUE DVT (brachial) & currently not candidate for anticoagulation, recent hx of traumatic R SDH Restrictions Weight Bearing  Restrictions: No  Pain: Denied c/o pain.   See Function Navigator for Current Functional Status.   Therapy/Group: Individual Therapy  Sandi Mariscal 02/09/2017, 3:46 PM

## 2017-02-09 NOTE — Progress Notes (Addendum)
[place] PHYSICAL MEDICINE & REHABILITATION     PROGRESS NOTE    Subjective/Complaints: Lying in bed. A little restless last night. Denies pain.  ROS: pt denies nausea, vomiting, diarrhea, cough, shortness of breath or chest pain     Objective: Vital Signs: Blood pressure 121/64, pulse 72, temperature 98.4 F (36.9 C), temperature source Oral, resp. rate 18, weight 100.2 kg (220 lb 14.4 oz), SpO2 96 %. No results found.  Recent Labs  02/09/17 0550  WBC 9.3  HGB 10.1*  HCT 32.2*  PLT 242    Recent Labs  02/09/17 0550  NA 138  K 4.4  CL 100*  GLUCOSE 84  BUN 13  CREATININE 1.12  CALCIUM 8.0*   CBG (last 3)  No results for input(s): GLUCAP in the last 72 hours.  Wt Readings from Last 3 Encounters:  02/09/17 100.2 kg (220 lb 14.4 oz)  02/01/17 106 kg (233 lb 11 oz)  01/10/17 91.8 kg (202 lb 6.1 oz)    Physical Exam:  Constitutional: He is oriented to person, place, and time. He appears well-developed.  Obese  HENT:  Head: Normocephalic. External ears normal. Eyes: Conjunctivae and EOM are normal. Right eye exhibits no discharge. Left eye exhibits no discharge.  Neck: Normal range of motion. Neck supple.  Cardiovascular:  IRR/IRR Respiratory: CTA B GI: Soft. Bowel sounds are normal.  Musculoskeletal: He exhibits 1+ edema LUE, no further weeping seen, tenting of skin  Neurological: He is alert and oriented to person, place, and time.  Motor: 4-/5 UE. 3/5 HF,KE 4/5 distal.  Sensation intact to light touch  Skin: Skin is warm and dry.  Psychiatric: He has a normal mood and affect. His behavior is normal.   Assessment/Plan: 1. Functional deficits secondary to debility after sepsis/multiple medical which require 3+ hours per day of interdisciplinary therapy in a comprehensive inpatient rehab setting. Physiatrist is providing close team supervision and 24 hour management of active medical problems listed below. Physiatrist and rehab team continue to assess  barriers to discharge/monitor patient progress toward functional and medical goals.  Function:  Bathing Bathing position   Position: Bed (and w/c at sink (UB))  Bathing parts Body parts bathed by patient: Right arm, Left arm, Chest, Abdomen Body parts bathed by helper: Front perineal area, Buttocks, Right upper leg, Left upper leg, Right lower leg, Left lower leg  Bathing assist Assist Level:  (patient completed 20%, total assist)      Upper Body Dressing/Undressing Upper body dressing   What is the patient wearing?: Pull over shirt/dress     Pull over shirt/dress - Perfomed by patient: Thread/unthread right sleeve, Thread/unthread left sleeve Pull over shirt/dress - Perfomed by helper: Put head through opening, Pull shirt over trunk        Upper body assist Assist Level:  (MOD A)   Set up : To obtain clothing/put away  Lower Body Dressing/Undressing Lower body dressing   What is the patient wearing?: Pants, Socks   Underwear - Performed by helper: Thread/unthread right underwear leg, Thread/unthread left underwear leg, Pull underwear up/down   Pants- Performed by helper: Thread/unthread right pants leg, Pull pants up/down, Thread/unthread left pants leg   Non-skid slipper socks- Performed by helper: Don/doff right sock, Don/doff left sock               TED Hose - Performed by helper: Don/doff right TED hose, Don/doff left TED hose  Lower body assist Assist for lower body dressing:  (TOTAL)  Toileting Toileting Toileting activity did not occur: No continent bowel/bladder event Toileting steps completed by patient: Adjust clothing prior to toileting Toileting steps completed by helper: Adjust clothing prior to toileting, Performs perineal hygiene Toileting Assistive Devices: Grab bar or rail  Toileting assist Assist level: Touching or steadying assistance (Pt.75%)   Transfers Chair/bed transfer   Chair/bed transfer method: Other Chair/bed transfer assist  level: 2 helpers Chair/bed transfer assistive device: Mechanical lift Mechanical lift: Maximove   Locomotion Ambulation Ambulation activity did not occur: Safety/medical concerns         Wheelchair   Type: Manual Max wheelchair distance: 30 ft (BLE) Assist Level: Dependent (Pt equals 0%)  Cognition Comprehension Comprehension assist level: Follows basic conversation/direction with no assist  Expression Expression assist level: Expresses basic needs/ideas: With no assist  Social Interaction Social Interaction assist level: Interacts appropriately 75 - 89% of the time - Needs redirection for appropriate language or to initiate interaction.  Problem Solving Problem solving assist level: Solves basic 75 - 89% of the time/requires cueing 10 - 24% of the time  Memory Memory assist level: Recognizes or recalls 75 - 89% of the time/requires cueing 10 - 24% of the time   Medical Problem List and Plan: 1.  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  DVT Prophylaxis/Anticoagulation: SCDs. Monitor for any signs of DVT 3. Pain Management: PRN oxycodone  -scheduled oxycodone and tylenol prior therapy each am and at noon with ?some improvement 4. Mood: Xanax 0.25 mg daily at bedtime, Lexapro 10 mg daily at bedtime  -denies depression at present  -neuropsych following.   -continue to provide emotional support as possible 5. Neuropsych: This patient is capable of making decisions on his own behalf. 6. Skin/Wound Care: Routine skin checks 7. Fluids/Electrolytes/Nutrition: encourage PO. Ate 75-100% yesterday  -I personally reviewed all of the patient's labs today, and lab work is within normal limits.  8. Seizure prophylaxis. Keppra 500 mg twice a day 9. ID/bacteremia. Septra since 4/8 10. COPD/OSA. Check oxygen saturations every shift 11. Atrial fibrillation/AVR/CABG/PPM. Continue low-dose aspirin as  well as amiodarone 200 mg twice a day. Chronic Coumadin discontinued due to subdural hematomas 12. Diastolic congestive heart failure. Lasix 20 mg daily. Monitor for any signs of fluid overload  -daily weights---weight at 100kg currently  -intake can fluctuate.  13. Hypertension. Currently with hypotension. Toprol-XL 12.5 mg daily, still holding presently 14. Hyperlipidemia. Lipitor 15. Prostatic abscess: S/p TURP. Cont abx 16. ABLA: Monitor CBC 17. LUE DVT: Brachial vein. Not an anticoagulation candidate due to recent subdural hematoma and craniotomy  -continue to elevate/conservative care--  -edema slowly improving  LOS (Days) 8 A FACE TO FACE EVALUATION WAS PERFORMED  [name], MD 02/09/2017 8:56 AM

## 2017-02-09 NOTE — Progress Notes (Addendum)
Physical Therapy Weekly Progress Note  Patient Details  Name: ABDELRAHMAN NAIR MRN: [id number] Date of Birth: [birth year]  Beginning of progress report period: February 01, 2017 End of progress report period: February 09, 2017  Today's Date: 02/09/2017  Patient has met 0 of 3 short term goals.  Pt has not met any STG's 2/2 poor activity tolerance and slow progress. Pt continues to require use of mechanical lift for increased safety with bed<>chair transfers. Pt has poor upright tolerance and experiences drops in BP when attempting upright positions (standing frame, tilt table). Pt continues to be willing to participate in all treatments but requires frequent rest breaks 2/2 fatigue. Pt's goals may need to be downgraded to mod assist overall but therapist will determine this over the course of the next week.   Patient continues to demonstrate the following deficits muscle weakness, decreased cardiorespiratoy endurance and decreased oxygen support, decreased coordination, decreased attention, decreased awareness, decreased problem solving, decreased safety awareness, decreased memory and delayed processing, and decreased sitting balance, decreased standing balance, decreased postural control and decreased balance strategies and therefore will continue to benefit from skilled PT intervention to increase functional independence with mobility.  Patient is very slowly progressing toward long term goals..  Continue plan of care.  PT Short Term Goals Week 1:  PT Short Term Goal 1 (Week 1): Pt will perform bed mobility with mod assist +1. PT Short Term Goal 1 - Progress (Week 1): Not met PT Short Term Goal 2 (Week 1): Pt will transfer sit<>stand with max assist +1. PT Short Term Goal 2 - Progress (Week 1): Not met PT Short Term Goal 3 (Week 1): Pt will transfer bed<>w/c with max assist +1. PT Short Term Goal 3 - Progress (Week 1): Not met Week 2:  PT Short Term Goal 1 (Week 2): Pt will perform all bed mobility  tasks with mod assist +1. PT Short Term Goal 2 (Week 2): Pt will transfer sit<>stand with max assist +1. PT Short Term Goal 3 (Week 2): Pt will transfer bed<>w/c with max assist +1.  Therapy Documentation Precautions:  Precautions Precautions: Fall Precaution Comments: monitor O2, pt has LUE DVT (brachial) & currently not candidate for anticoagulation, recent hx of traumatic R SDH Restrictions Weight Bearing Restrictions: No  Waunita Schooner 02/09/2017, 5:12 PM

## 2017-02-09 NOTE — Progress Notes (Signed)
Occupational Therapy Session Note  Patient Details  Name: Alex [name] MRN: [id number] Date of Birth: Feb 03, 1931  Today's Date: 02/09/2017 OT Individual Time: 1001-1101 OT Individual Time Calculation (min): 60 min    Short Term Goals: Week 1:  OT Short Term Goal 1 (Week 1): Pt will perform stand pivot transfer with mod A +1 to BSC/ toilet OT Short Term Goal 2 (Week 1): Pt will perform sit to stand for clothing management with  mod A  OT Short Term Goal 4 (Week 1): Pt will be oriented x4 with mod external cues     Skilled Therapeutic Interventions/Progress Updates: Skilled OT session completed with focus on ADL retraining, endurance, and use of AE. Pt was supine in bed at time of arrival, 02 91% on supplemental 02, HR 91BPM. He was agreeable to complete bathing/dressing. Bed set to chair position. LH sponge used to reach feet in figure 4 position. HOB flat for rolling R>L for pericare with L UE protected. Extra time for attempting to "toss" pants over elevated LE for threading. He required OT assist for successful threading. Pt able to bridge to lift pants over hips with instruction. Teds and gripper socks donned by therapist. Supine<sit completed with Max A. He donned shirt at EOB with Min-Mod A for balance. At end of tx pt was returned to bed, L UE elevated. 02 sats 97% and HR 86BPM at time of departure.      Therapy Documentation Precautions:  Precautions Precautions: Fall Precaution Comments: monitor O2, pt has LUE DVT (brachial) & currently not candidate for anticoagulation, recent hx of traumatic R SDH Restrictions Weight Bearing Restrictions: No:   Pain: No c/o pain during session  Pain Assessment Pain Assessment: No/denies pain Pain Score: 0-No pain Patients Stated Pain Goal: 2 ADL: ADL ADL Comments: See functional assessment tool:    See Function Navigator for Current Functional Status.   Therapy/Group: Individual Therapy  Elaina Cara A Lunah Losasso 02/09/2017, 12:40 PM

## 2017-02-09 NOTE — Progress Notes (Signed)
Nutrition Brief Note  Patient identified on the Malnutrition Screening Tool (MST) Report  Wt Readings from Last 15 Encounters:  02/09/17 220 lb 14.4 oz (100.2 kg)  02/01/17 233 lb 11 oz (106 kg)  01/10/17 202 lb 6.1 oz (91.8 kg)  12/16/16 227 lb 8.2 oz (103.2 kg)  12/15/16 232 lb (105.2 kg)  12/11/16 254 lb 11.2 oz (115.5 kg)  12/01/16 232 lb (105.2 kg)  11/26/16 240 lb 9.6 oz (109.1 kg)  11/05/16 234 lb 12.8 oz (106.5 kg)  11/03/16 233 lb (105.7 kg)  04/10/16 232 lb 1.9 oz (105.3 kg)  02/14/16 233 lb 12.8 oz (106.1 kg)  11/06/15 225 lb (102.1 kg)  10/26/15 237 lb (107.5 kg)  08/20/15 230 lb 1.9 oz (104.4 kg)    Body mass index is 28.36 kg/m. Patient meets criteria for overweight based on current BMI. Weight has been fluctuating but mostly stable.   Current diet order is regular, patient is consuming approximately 75-100% of meals at this time. Pt reports having a good appetite currently and PTA with usual consumption of at least 2 meals a day with no other difficulties. Labs and medications reviewed.   No nutrition interventions warranted at this time. If nutrition issues arise, please consult RD.   Roslyn Smiling, MS, RD, LDN Pager # [phone] After hours/ weekend pager # [phone]

## 2017-02-09 NOTE — Progress Notes (Signed)
Occupational Therapy Session Note  Patient Details  Name: Alex [name] MRN: [id number] Date of Birth: [birth year]  Today's Date: 02/09/2017 OT Individual Time: 1130-1203 OT Individual Time Calculation (min): 33 min    Short Term Goals: Week 1:  OT Short Term Goal 1 (Week 1): Pt will perform stand pivot transfer with mod A +1 to BSC/ toilet OT Short Term Goal 2 (Week 1): Pt will perform sit to stand for clothing management with  mod A  OT Short Term Goal 4 (Week 1): Pt will be oriented x4 with mod external cues  Skilled Therapeutic Interventions/Progress Updates:    Upon entering the room, pt supine in bed with no c/o pain this session. Pt unable to remember therapy session prior to OT arrival. Pt oriented to self only this session and required max cues to orient to location, time, and situation. Pt performed supine >sit with max A to EOB. Static sitting balance for 4 minutes with supervision - min A for balance. STEDY utilized for transfer into wheelchair. Multiple attempts made to stand from bed into stedy with pt unable to stand upright. OT with 2 additional helpers to stand pt within stedy for transfer into wheelchair. Pt standing from elevated STEDY seat with +2 assistance. Quick release belt donned and pt remained in wheelchair at end of session. Call bell and all needed items within reach upon exiting the room.   Therapy Documentation Precautions:  Precautions Precautions: Fall Precaution Comments: monitor O2, pt has LUE DVT (brachial) & currently not candidate for anticoagulation, recent hx of traumatic R SDH Restrictions Weight Bearing Restrictions: No General:   Vital Signs:   Pain: Pain Assessment Pain Assessment: No/denies pain Pain Score: 0-No pain Patients Stated Pain Goal: 2 ADL: ADL ADL Comments: See functional assessment tool Exercises:   Other Treatments:    See Function Navigator for Current Functional Status.   Therapy/Group: Individual  Therapy  Alen Bleacher 02/09/2017, 12:45 PM

## 2017-02-10 ENCOUNTER — Inpatient Hospital Stay (HOSPITAL_COMMUNITY): Payer: Medicare Other | Admitting: Occupational Therapy

## 2017-02-10 ENCOUNTER — Inpatient Hospital Stay (HOSPITAL_COMMUNITY): Payer: Medicare Other | Admitting: Physical Therapy

## 2017-02-10 LAB — GLUCOSE, CAPILLARY: GLUCOSE-CAPILLARY: 114 mg/dL — AB (ref 65–99)

## 2017-02-10 MED ORDER — SENNOSIDES-DOCUSATE SODIUM 8.6-50 MG PO TABS
1.0000 | ORAL_TABLET | Freq: Two times a day (BID) | ORAL | Status: DC
  Administered 2017-02-10 – 2017-02-18 (×16): 1 via ORAL
  Filled 2017-02-10 (×18): qty 1

## 2017-02-10 MED ORDER — ESCITALOPRAM OXALATE 10 MG PO TABS
5.0000 mg | ORAL_TABLET | Freq: Every day | ORAL | Status: DC
  Administered 2017-02-10 – 2017-02-17 (×8): 5 mg via ORAL
  Filled 2017-02-10 (×8): qty 1

## 2017-02-10 NOTE — Progress Notes (Signed)
Physical Therapy Session Note  Patient Details  Name: Alex [name] MRN: [id number] Date of Birth: 1931-10-17  Today's Date: 02/10/2017 PT Individual Time: 1305-1406 PT Individual Time Calculation (min): 61 min   Short Term Goals: Week 2:  PT Short Term Goal 1 (Week 2): Pt will perform all bed mobility tasks with mod assist +1. PT Short Term Goal 2 (Week 2): Pt will transfer sit<>stand with max assist +1. PT Short Term Goal 3 (Week 2): Pt will transfer bed<>w/c with max assist +1.  Skilled Therapeutic Interventions/Progress Updates:  Pt received in w/c finishing lunch, pt unable to recall therapist's name nor reasoning for being in hospital - therapist provided education & orientation. Pt put on room air per request of RN; SpO2 = 87-89% therefore pt placed on 1L/min supplemental oxygen and then able to maintain SpO2 WNL. Pt engaged in 2# weighted bar ball taps for endurance training with cuing for technique. Pt demonstrates impaired coordination LUE. Pt then utilized cybex kinetron from w/c level, with cuing for proper foot placement and technique, for strengthening purposes. Throughout session pt requires cuing for pursed lip breathing as pt prefers to breathe through mouth. Therapist adjusted w/c positioning x 5 minutes to provide pressure relief to pt's buttocks with pt reporting "that feels better". At end of session pt left sitting in TIS w/c in room with QRB donned, all needs within reach, & family present to supervise.   Therapy Documentation Precautions:  Precautions Precautions: Fall Precaution Comments: monitor O2, pt has LUE DVT (brachial) & currently not candidate for anticoagulation, recent hx of traumatic R SDH Restrictions Weight Bearing Restrictions: No  Pain: c/o buttock pain - RN provided medication & therapist repositioned w/c.  See Function Navigator for Current Functional Status.   Therapy/Group: Individual Therapy  Sandi Mariscal 02/10/2017, 2:19 PM

## 2017-02-10 NOTE — Plan of Care (Signed)
Problem: RH Balance Goal: LTG: Patient will maintain dynamic sitting balance (OT) LTG:  Patient will maintain dynamic sitting balance with assistance during activities of daily living (OT)  Downgraded secondary to slow progress Goal: LTG Patient will maintain dynamic standing with ADLs (OT) LTG:  Patient will maintain dynamic standing balance with assist during activities of daily living (OT)   Downgraded secondary to slow progress  Problem: RH Bathing Goal: LTG Patient will bathe with assist, cues/equipment (OT) LTG: Patient will bathe specified number of body parts with assist with/without cues using equipment (position)  (OT)  Downgraded secondary to slow progress  Problem: RH Dressing Goal: LTG Patient will perform lower body dressing w/assist (OT) LTG: Patient will perform lower body dressing with assist, with/without cues in positioning using equipment (OT)  Downgraded secondary to slow progress  Problem: RH Toileting Goal: LTG Patient will perform toileting w/assist, cues/equip (OT) LTG: Patient will perform toiletiing (clothes management/hygiene) with assist, with/without cues using equipment (OT)  Downgraded secondary to slow progress  Problem: RH Toilet Transfers Goal: LTG Patient will perform toilet transfers w/assist (OT) LTG: Patient will perform toilet transfers with assist, with/without cues using equipment (OT)  Downgraded secondary to slow progress  Problem: RH Tub/Shower Transfers Goal: LTG Patient will perform tub/shower transfers w/assist (OT) LTG: Patient will perform tub/shower transfers with assist, with/without cues using equipment (OT)  Downgraded secondary to slow progress

## 2017-02-10 NOTE — Progress Notes (Signed)
[facility] PHYSICAL MEDICINE & REHABILITATION     PROGRESS NOTE    Subjective/Complaints: Lying in bed. A little restless last night. Denies pain.  ROS: pt denies nausea, vomiting, diarrhea, cough, shortness of breath or chest pain     Objective: Vital Signs: Blood pressure (!) 96/50, pulse 66, temperature 97.3 F (36.3 C), temperature source Oral, resp. rate 18, weight 93.9 kg (207 lb), SpO2 95 %. No results found.  Recent Labs  02/09/17 0550  WBC 9.3  HGB 10.1*  HCT 32.2*  PLT 242    Recent Labs  02/09/17 0550  NA 138  K 4.4  CL 100*  GLUCOSE 84  BUN 13  CREATININE 1.12  CALCIUM 8.0*   CBG (last 3)  No results for input(s): GLUCAP in the last 72 hours.  Wt Readings from Last 3 Encounters:  02/10/17 93.9 kg (207 lb)  02/01/17 106 kg (233 lb 11 oz)  01/10/17 91.8 kg (202 lb 6.1 oz)    Physical Exam:  Constitutional: no distress.  Obese  HENT:  Head: Normocephalic. External ears normal. Eyes: Conjunctivae and EOM are normal. Right eye exhibits no discharge. Left eye exhibits no discharge.  Neck: Normal range of motion. Neck supple.  Cardiovascular:  IRR IRR Respiratory: CTA B GI: Soft. Bowel sounds are normal.  Musculoskeletal: He exhibits 1+ edema LUE, no further weeping seen, tenting of skin  Neurological: awakens easily. .  Motor: 4-/5 UE. 3/5 HF,KE 4/5 distal.  Sensation intact to light touch  Skin: Skin is warm and dry.  Psychiatric: He has a normal mood and affect. His behavior is normal.   Assessment/Plan: 1. Functional deficits secondary to debility after sepsis/multiple medical which require 3+ hours per day of interdisciplinary therapy in a comprehensive inpatient rehab setting. Physiatrist is providing close team supervision and 24 hour management of active medical problems listed below. Physiatrist and rehab team continue to assess barriers to discharge/monitor patient progress toward functional and medical  goals.  Function:  Bathing Bathing position   Position: Bed  Bathing parts Body parts bathed by patient: Right arm, Chest, Abdomen, Left arm, Front perineal area, Right upper leg, Left upper leg Body parts bathed by helper: Buttocks, Back, Right lower leg, Left lower leg  Bathing assist Assist Level:  (patient completed 20%, total assist)      Upper Body Dressing/Undressing Upper body dressing   What is the patient wearing?: Pull over shirt/dress     Pull over shirt/dress - Perfomed by patient: Thread/unthread right sleeve, Thread/unthread left sleeve, Put head through opening Pull over shirt/dress - Perfomed by helper: Pull shirt over trunk        Upper body assist Assist Level:  (MOD A)   Set up : To obtain clothing/put away  Lower Body Dressing/Undressing Lower body dressing   What is the patient wearing?: Pants, Non-skid slipper socks, Ted Hose   Underwear - Performed by helper: Thread/unthread right underwear leg, Thread/unthread left underwear leg, Pull underwear up/down Pants- Performed by patient: Pull pants up/down Pants- Performed by helper: Thread/unthread right pants leg, Thread/unthread left pants leg   Non-skid slipper socks- Performed by helper: Don/doff right sock, Don/doff left sock               TED Hose - Performed by helper: Don/doff right TED hose, Don/doff left TED hose  Lower body assist Assist for lower body dressing:  (TOTAL)      Toileting Toileting Toileting activity did not occur: No continent bowel/bladder event Toileting steps  completed by patient: Adjust clothing prior to toileting Toileting steps completed by helper: Adjust clothing prior to toileting, Performs perineal hygiene Toileting Assistive Devices: Grab bar or rail  Toileting assist Assist level: Touching or steadying assistance (Pt.75%)   Transfers Chair/bed transfer   Chair/bed transfer method: Other Chair/bed transfer assist level: 2 helpers Chair/bed transfer  assistive device: Mechanical lift Mechanical lift: Maximove   Locomotion Ambulation Ambulation activity did not occur: Safety/medical concerns         Wheelchair   Type: Manual Max wheelchair distance: 30 ft (BLE) Assist Level: Dependent (Pt equals 0%)  Cognition Comprehension Comprehension assist level: Follows basic conversation/direction with no assist  Expression Expression assist level: Expresses basic needs/ideas: With no assist  Social Interaction Social Interaction assist level: Interacts appropriately 90% of the time - Needs monitoring or encouragement for participation or interaction.  Problem Solving Problem solving assist level: Solves basic problems with no assist  Memory Memory assist level: Recognizes or recalls 90% of the time/requires cueing < 10% of the time   Medical Problem List and Plan: 1.  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  DVT Prophylaxis/Anticoagulation: SCDs. Monitor for any signs of DVT 3. Pain Management: PRN oxycodone  -scheduled oxycodone and tylenol prior therapy each am and at noon with ?some improvement 4. Mood: Xanax 0.25 mg daily at bedtime, Lexapro 10 mg daily at bedtime  -denies depression    -neuropsych following.   -continue to provide emotional support as possible 5. Neuropsych: This patient is capable of making decisions on his own behalf. 6. Skin/Wound Care: Routine skin checks 7. Fluids/Electrolytes/Nutrition: encourage PO. Ate 75-100% yesterday again  8. Seizure prophylaxis. Keppra 500 mg twice a day 9. ID/bacteremia. Septra since 4/8 10. COPD/OSA. Check oxygen saturations every shift 11. Atrial fibrillation/AVR/CABG/PPM. Continue low-dose aspirin as well as amiodarone 200 mg twice a day. Chronic Coumadin discontinued due to subdural  hematomas 12. Diastolic congestive heart failure. Lasix 20 mg daily.--hold due to low bp's  -daily weights---weight at 100kg recentyl (inaccurate reading today)---re-weigh    13. Hypertension. Currently with hypotension. Toprol-XL 12.5 mg daily on hold. Hold lasix too 14. Hyperlipidemia. Lipitor 15. Prostatic abscess: S/p TURP. Cont bactrim through 4/28 16. ABLA: Monitor CBC 17. LUE DVT: Brachial vein. Not an anticoagulation candidate due to recent subdural hematoma and craniotomy  -continue to elevate/conservative care--  -edema slowly improving  LOS (Days) 9 A FACE TO FACE EVALUATION WAS PERFORMED  [name], MD 02/10/2017 8:19 AM

## 2017-02-10 NOTE — Progress Notes (Signed)
Occupational Therapy Weekly Progress Note and OT Intervention  Patient Details  Name: Alex [name] MRN: [id number] Date of Birth: 07/27/1931  Beginning of progress report period: February 01, 2017 End of progress report period: February 10, 2017  Today's Date: 02/10/2017 OT Individual Time: 0900-0958 and 1500-1530 OT Individual Time Calculation (min): 58 min and  30 min    Patient has met 1 of 4 short term goals. Pt making slow progress towards occupational therapy goals this week. Pt requires +2 assistance for functional transfers for safety. LB self care performed at bed level with total A and UB self care seated in wheelchair at mod A level. Pt oriented to self and location and required max A to orient to time and situation each session.   Patient continues to demonstrate the following deficits: muscle weakness, decreased cardiorespiratoy endurance and decreased oxygen support, decreased initiation, decreased attention, decreased awareness, decreased problem solving, decreased safety awareness and decreased memory and decreased sitting balance, decreased standing balance and decreased balance strategies and therefore will continue to benefit from skilled OT intervention to enhance overall performance with BADL.  Patient not progressing toward long term goals.  See goal revision..  Plan of care revisions: goals downgraded to mod A secondary to slow progress.  OT Short Term Goals Week 1:  OT Short Term Goal 1 (Week 1): Pt will perform stand pivot transfer with mod A +1 to BSC/ toilet OT Short Term Goal 1 - Progress (Week 1): Not met OT Short Term Goal 2 (Week 1): Pt will perform sit to stand for clothing management with  mod A  OT Short Term Goal 2 - Progress (Week 1): Not met OT Short Term Goal 3 - Progress (Week 1): Met OT Short Term Goal 4 (Week 1): Pt will be oriented x4 with mod external cues OT Short Term Goal 4 - Progress (Week 1): Progressing toward goal Week 2:  OT Short Term Goal 1  (Week 2): Pt will perform LB dressing with max A in order to decrease level of assist with functional task.  OT Short Term Goal 2 (Week 2): Pt will demonstrate min A dynamic sitting balance for 5 minutes during functional task.  OT Short Term Goal 3 (Week 2): Pt will perform UB dressing with min A in order to increase I with self care. OT Short Term Goal 4 (Week 2): Pt will perform toilet transfer with max A of 1 in order to reduce level of assitance with functional transfers.  Skilled Therapeutic Interventions/Progress Updates:  Session 1: Upon entering the room, pt supine in bed and requesting to wash and change clothing. LB dressing and bathing performed from supine with total A for LB clothing management. Pt rolls L and R with mod A for LE. Pt transferred via maxi move from bed >wheelchair and seated at sink for UB self care. Pt performed grooming tasks with set up A and mod cues for initiation and sequencing. Pt required min A for anterior weight shift to doff and don pull over shirt. Pt required increased time and max cues to thread shirt over B UEs and required assistance to get over neck and pull down trunk. Mirror utilized for visual feedback with task. Quick release belt donned and pt reclined in wheelchair with wife present in room upon exiting.   Session 2: Upon entering the room, pt seated in wheelchair with family present in the room. Pt reports pain in buttocks and family requests pt return to bed. Pt transferred via   maxi move from wheelchair >bed and pt rolled L <> R with mod A in order to remove sling. Pt remained supine in bed with bed alarm activated and call bell within reach.   Therapy Documentation Precautions:  Precautions Precautions: Fall Precaution Comments: monitor O2, pt has LUE DVT (brachial) & currently not candidate for anticoagulation, recent hx of traumatic R SDH Restrictions Weight Bearing Restrictions: No General:   Vital Signs: Oxygen Therapy SpO2: 98 % O2  Device: Nasal Cannula O2 Flow Rate (L/min): 1 L/min Pulse Oximetry Type: Intermittent Pain:   ADL: ADL ADL Comments: See functional assessment tool Exercises:   Other Treatments:    See Function Navigator for Current Functional Status.   Therapy/Group: Individual Therapy  Gypsy Decant 02/10/2017, 7:31 PM

## 2017-02-10 NOTE — Plan of Care (Signed)
Problem: RH Balance Goal: LTG Patient will maintain dynamic standing balance (PT) LTG:  Patient will maintain dynamic standing balance with assistance during mobility activities (PT)  Downgrade 2/2 slow progress  Problem: RH Bed Mobility Goal: LTG Patient will perform bed mobility with assist (PT) LTG: Patient will perform bed mobility with assistance, with/without cues (PT).  Downgrade 2/2 slow progress  Problem: RH Car Transfers Goal: LTG Patient will perform car transfers with assist (PT) LTG: Patient will perform car transfers with assistance (PT).  Downgrade 2/2 slow progress  Problem: RH Furniture Transfers Goal: LTG Patient will perform furniture transfers w/assist (OT/PT LTG: Patient will perform furniture transfers  with assistance (OT/PT).  Downgrade 2/2 slow progress  Problem: RH Ambulation Goal: LTG Patient will ambulate in controlled environment (PT) LTG: Patient will ambulate in a controlled environment, # of feet with assistance (PT).  10 ft with LRAD; downgrade 2/2 slow progress Goal: LTG Patient will ambulate in home environment (PT) LTG: Patient will ambulate in home environment, # of feet with assistance (PT).  Outcome: Not Applicable Date Met: 19/80/22 D/c 2/2 slow progress  Problem: RH Wheelchair Mobility Goal: LTG Patient will propel w/c in controlled environment (PT) LTG: Patient will propel wheelchair in controlled environment, # of feet with assist (PT)  50 ft; downgrade 2/2 slow progress  Problem: RH Stairs Goal: LTG Patient will ambulate up and down stairs w/assist (PT) LTG: Patient will ambulate up and down # of stairs with assistance (PT)  3 steps with R rail for home access; downgrade 2/2 slow progress

## 2017-02-11 ENCOUNTER — Inpatient Hospital Stay (HOSPITAL_COMMUNITY): Payer: Medicare Other | Admitting: Occupational Therapy

## 2017-02-11 ENCOUNTER — Encounter (HOSPITAL_COMMUNITY): Payer: Medicare Other | Admitting: Psychology

## 2017-02-11 ENCOUNTER — Inpatient Hospital Stay (HOSPITAL_COMMUNITY): Payer: Medicare Other | Admitting: Physical Therapy

## 2017-02-11 DIAGNOSIS — F0390 Unspecified dementia without behavioral disturbance: Secondary | ICD-10-CM

## 2017-02-11 LAB — GLUCOSE, CAPILLARY: GLUCOSE-CAPILLARY: 84 mg/dL (ref 65–99)

## 2017-02-11 NOTE — Progress Notes (Signed)
Physical Therapy Session Note  Patient Details  Name: Alex [name] MRN: [id number] Date of Birth: 12/08/1930  Today's Date: 02/11/2017 PT Individual Time: 7824-2353 PT Individual Time Calculation (min): 30 min   Short Term Goals: Week 2:  PT Short Term Goal 1 (Week 2): Pt will perform all bed mobility tasks with mod assist +1. PT Short Term Goal 2 (Week 2): Pt will transfer sit<>stand with max assist +1. PT Short Term Goal 3 (Week 2): Pt will transfer bed<>w/c with max assist +1.  Skilled Therapeutic Interventions/Progress Updates: Pt presented in bed agreeable to therapy. Performed supine to sit with mod/maxA x 1 with cues for LE placement and use of RUE to facilitate transfer. Pt able to tolerate sitting up x 8 min while performing LAQ, ankle pumps, pillow squeezes, and required moderate cues for maintaining midline and attempting to activate core ms. Pt returned to supine with maxA x 1 and use of bed rails. After theraputic break pt agreeable to attempt supine to sit again and was performed with modA. Pt sat at EOB for additional 6 min before becoming fatigued returning to bed requiring same assist as prior. Pt left in bed with needs met.      Therapy Documentation Precautions:  Precautions Precautions: Fall Precaution Comments: monitor O2, pt has LUE DVT (brachial) & currently not candidate for anticoagulation, recent hx of traumatic R SDH Restrictions Weight Bearing Restrictions: No General:   Vital Signs: Therapy Vitals Temp: 97.6 F (36.4 C) Temp Source: Oral Pulse Rate: 82 Resp: 18 BP: (!) 81/61 Patient Position (if appropriate): Sitting Oxygen Therapy SpO2: 99 % O2 Device: Not Delivered   See Function Navigator for Current Functional Status.   Therapy/Group: Individual Therapy  Shaquasha Gerstel  Denver Bentson, PTA  02/11/2017, 3:07 PM

## 2017-02-11 NOTE — Consult Note (Signed)
Neuropsychological Consualtation    Patient:  Alex [name]   DOB: 1931/05/05  MR Number: [id number]  Location: [facility] [id number] [place] [address] Dept: [phone] Loc: [phone]  Start: 8 AM End: 9 AM  Provider/Observer:     Arley Phenix, Psy.D>   Chief Complaint:     The patient is having some cognitive processing issues and there was a referral for neuropsychological consultation to initially assess. This visit is for formal objective neuropsychological testing.  Reason For Service:     The patient was referred for a neuropsychological consultation.  He was seen seen after a subdural hematoma due to traumatic fall.  He received inpatient rehabilitation services between feb 23, 18 and march 17th 2018.  After discharge home patient was doing very well needing minimal assistance with assistive device and on regular diet.  Present to ED on 3.28.2018 after new decline in functioning and was readmitted to rehabilitative services.    Testing Administered:  RBANS Form A  Participation Level:   Active  Participation Quality:  Appropriate and Attentive      Behavioral Observation:  Fairly Groomed, Lethargic, and Appropriate.   Test Results:        Total Index Score:  68  The patient produced a total index score of 68 which falls in the extremely low range. This index score gives a overall summary of global cognitive functioning. This level of impairment suggests significant cognitive deficits.       Immediate Memory Index:  85  The patient's performance on immediate memory test falls in the low  average range.  The patient did exceptionally well on the story learning task where he achieved a standard score of 12 where 10 is the mean. His performance was in the upper end of the average range for story memory. However, the patient had significant difficulties and deficits with  regard to list learning. The patient did have some difficulty with initially encoding information and a difficulty learning unconnected information such as a list. However, when the information was more of a story learning the patient's performance improved significantly. This performance does indicate that the patient is able to initially learned information rather efficiently relative to his overall functioning.        Visuospatial / Constructional Index:  58  The patient's performance on the visual spatial/visual constructional index falls in the extremely low range. This measure measures basic visuospatial and perceptual abilities. His performance indicates difficulty with processing and using visuospatial information. The patient had difficulty with visual reasoning and problem-solving as well.  This level of impairment with regard to visual spatial abilities will have significant impacts in the patient's ability to function independently with ADLs and other day-to-day living tasks such as food preparation, using tools, and certainly aspects such as driving.       Attention Index:  72  This index score is quite deceiving as is not a good representation of the patient's performance on auditory encoding measures. The patient was not able to do the coding subtest to any degree because of fine motor control issues. On the digit span test the patient performed in the average range for his age and overall did very well on auditory encoding test. The patient was able to attend to simple auditory information and maintain basic attention. This is a very good indicator for the patient's ability to process information being presented to him during rehabilitative  efforts.  This is also very good finding with regard to the patient's able to do daily tasks and ADLs upon discharge.      Language Index:  83  The patient's performance on the language index score falls in the low average range. The patient was able  to effectively name common pictures and displayed adequate expressive language. However, fluency was impaired relative to self generated performance. The patient had significant difficulty on the verbal fluency subtest.     Delayed Memory Index:  75  The patient performed in the borderline range with regard to delayed memory functioning. The patient had some difficulty with retrieval of information from long-term memory stores.  The patient did much better with acute recall. While his free recall of learned auditory information was impaired and there were times where he was unable to identify anything that he had remembered earlier on immediate  memory tasks he was able to effectively remember upon cued presentations. When the patient is an situations were memory and recall are important any opportunity to acute recall will be quite helpful in his overall performance and effectiveness with daily activities.  Summary of Results:   Results of the current objective measure assessing a broad range of neuropsychological functioning indicates that from a global perspective that the patient is having some significant cognitive deficits. These deficits are primarily related to visual spatial/constructional abilities and overall speed of mental operations. The patient's auditory encoding appear to be fairly well preserved and his initial memory and learning appears to be fairly well preserved. However, the patient displayed an inability to effectively recall information after a delay in a free recall format where the patient was simply asked to recall he could without cueing. However, his performance did improve significantly with cueing.  Impression/Diagnosis:   The patient does have some residual neuropsychological deficits present. Visual-spatial deficits and speed of mental operations are present as well as free recall of recently learned information. However, the patient showed significant improvement with  cueing which could be effectively utilized in the rehabilitative efforts. The patient is going to have some significant difficulties with ADLs and other day-to-day activities because of fine motor control deficits and difficulties with self initiation. However, he is able to learned information and encode auditory information which will greatly facilitate his understanding and participation in physical    Arley Phenix, Psy.D. Neuropsychologist   Qualitative Description of RBANS Index Scores      Classification Descriptors for Subtest Scaled Scores Above 16 very superior

## 2017-02-11 NOTE — Progress Notes (Signed)
Occupational Therapy Session Note  Patient Details  Name: Alex [name] MRN: [id number] Date of Birth: 01/31/1931  Today's Date: 02/11/2017 OT Individual Time: 1100-1200 OT Individual Time Calculation (min): 60 min    Short Term Goals: Week 2:  OT Short Term Goal 1 (Week 2): Pt will perform LB dressing with max A in order to decrease level of assist with functional task.  OT Short Term Goal 2 (Week 2): Pt will demonstrate min A dynamic sitting balance for 5 minutes during functional task.  OT Short Term Goal 3 (Week 2): Pt will perform UB dressing with min A in order to increase I with self care. OT Short Term Goal 4 (Week 2): Pt will perform toilet transfer with max A of 1 in order to reduce level of assitance with functional transfers.  Skilled Therapeutic Interventions/Progress Updates:    Upon entering the room, pt seated in wheelchair with wife present in room. Pt with oxygen removed at beginning of session with O2 sats at 94% but as pt engaged in functional tasks it decreased to 89% on room air and pt unable to increase with pursed lip breathing techniques. Therefore, O2 placed via [place] at 1 L and O2 above 94% for the remainder of session. Pt seated in wheelchair at sink to shave face. Pt required mod verbal cues to locate needed items, sequence, and initiate task. Pt encouraged to utilize L UE to touch face in order to ensure thoroughness. OT propelled to to day room for table top activity where pt sat unsupported in wheelchair for 6 minutes for task. Pt returned to room and reclined in tilt in space wheelchair for pressure relief. Quick release belt donned and wife remained present in room at end of session.   Therapy Documentation Precautions:  Precautions Precautions: Fall Precaution Comments: monitor O2, pt has LUE DVT (brachial) & currently not candidate for anticoagulation, recent hx of traumatic R SDH Restrictions Weight Bearing Restrictions: No General:   Vital  Signs:  Pain:   ADL: ADL ADL Comments: See functional assessment tool Vision/Perception     Exercises:   Other Treatments:    See Function Navigator for Current Functional Status.   Therapy/Group: Individual Therapy  Alen Bleacher 02/11/2017, 5:17 PM

## 2017-02-11 NOTE — Progress Notes (Signed)
Physical Therapy Session Note  Patient Details  Name: Alex [name] MRN: [id number] Date of Birth: 11/24/1930  Today's Date: 02/11/2017 PT Individual Time: 0905-1001 PT Individual Time Calculation (min): 56 min   Short Term Goals: Week 2:  PT Short Term Goal 1 (Week 2): Pt will perform all bed mobility tasks with mod assist +1. PT Short Term Goal 2 (Week 2): Pt will transfer sit<>stand with max assist +1. PT Short Term Goal 3 (Week 2): Pt will transfer bed<>w/c with max assist +1.  Skilled Therapeutic Interventions/Progress Updates:  Pt received in bed & agreeable to tx, denying c/o pain. Pt able to recall this therapist's name today. Pt with pants donned already but required max assist to thread shirt on BUE but pt able to put shirt over head. Pt required assistance to pull shirt over trunk. Pt transferred supine>sitting with max assist requiring multimodal cuing for sequencing and facilitation to upright trunk. Once sitting EOB pt transferred bed>w/c via slide board with +2 assist. Pt requires max multimodal cuing for head hips relationship and multiple scoots to transfer to w/c. Transported pt to gym and pt completed slide board transfers w/c<>mat table with +2 assist with continued education and facilitation for weight shifting, head/hips relationship, and placement. Pt tolerated sitting EOM ~10 minutes with close supervision while engaging in kick ball and ball toss with task focusing on endurance training and sitting balance. At end of session pt left sitting in w/c in room with wife present, QRB donned, and all needs within reach.  Pt on room air at rest, SpO2 = 92%, HR = 67 bpm. After transferring supine>sitting EOB pt with SpO2 drops to ~83% and required 2L/min to increase to WNL.  Pt's LUE noted to be weeping a significant amount during session & RN made aware.  Therapy Documentation Precautions:  Precautions Precautions: Fall Precaution Comments: monitor O2, pt has LUE DVT  (brachial) & currently not candidate for anticoagulation, recent hx of traumatic R SDH Restrictions Weight Bearing Restrictions: No  Pain: Denied c/o pain.  See Function Navigator for Current Functional Status.   Therapy/Group: Individual Therapy  Sandi Mariscal 02/11/2017, 12:49 PM

## 2017-02-11 NOTE — Progress Notes (Signed)
[facility] PHYSICAL MEDICINE & REHABILITATION     PROGRESS NOTE    Subjective/Complaints: Had a fair night. Therapy reports that he fatigues easily and has been more confused. Wife agrees.  '  ROS: pt denies nausea, vomiting, diarrhea, cough, shortness of breath or chest pain      Objective: Vital Signs: Blood pressure (!) 110/59, pulse 74, temperature 97.8 F (36.6 C), temperature source Oral, resp. rate 18, weight 101.7 kg (224 lb 3.3 oz), SpO2 96 %. No results found.  Recent Labs  02/09/17 0550  WBC 9.3  HGB 10.1*  HCT 32.2*  PLT 242    Recent Labs  02/09/17 0550  NA 138  K 4.4  CL 100*  GLUCOSE 84  BUN 13  CREATININE 1.12  CALCIUM 8.0*   CBG (last 3)   Recent Labs  02/10/17 2110 02/11/17 0656  GLUCAP 114* 84    Wt Readings from Last 3 Encounters:  02/11/17 101.7 kg (224 lb 3.3 oz)  02/01/17 106 kg (233 lb 11 oz)  01/10/17 91.8 kg (202 lb 6.1 oz)    Physical Exam:  Constitutional: no distress.  Obese  HENT:  Head: Normocephalic. External ears normal. Eyes: Conjunctivae and EOM are normal. Right eye exhibits no discharge. Left eye exhibits no discharge.  Neck: Normal range of motion. Neck supple.  Cardiovascular:  IRR IRR Respiratory: CTA B GI: Soft. Bowel sounds are normal.  Musculoskeletal: He exhibits 1+ edema LUE, I see no weepign today Neurological: awake, recognizes me. States he's ready to go home. .  Motor: 4-/5 UE. 3/5 HF,KE 4/5 distal.  Sensation intact to light touch  Skin: Skin is warm and dry.  Psychiatric: He has a normal mood and affect. His behavior is normal.   Assessment/Plan: 1. Functional deficits secondary to debility after sepsis/multiple medical which require 3+ hours per day of interdisciplinary therapy in a comprehensive inpatient rehab setting. Physiatrist is providing close team supervision and 24 hour management of active medical problems listed below. Physiatrist and rehab team continue to assess barriers to  discharge/monitor patient progress toward functional and medical goals.  Function:  Bathing Bathing position   Position: Other (comment) (LB in bed and UB at sink)  Bathing parts Body parts bathed by patient: Right arm, Chest, Abdomen, Left arm, Front perineal area Body parts bathed by helper: Buttocks, Right lower leg, Left lower leg, Back, Right upper leg, Left upper leg  Bathing assist Assist Level:  (max A)      Upper Body Dressing/Undressing Upper body dressing   What is the patient wearing?: Pull over shirt/dress     Pull over shirt/dress - Perfomed by patient: Thread/unthread right sleeve, Thread/unthread left sleeve Pull over shirt/dress - Perfomed by helper: Put head through opening, Pull shirt over trunk        Upper body assist Assist Level:  (mod a)   Set up : To obtain clothing/put away  Lower Body Dressing/Undressing Lower body dressing   What is the patient wearing?: Pants, Non-skid slipper socks, Ted Hose, Underwear   Underwear - Performed by helper: Thread/unthread right underwear leg, Thread/unthread left underwear leg, Pull underwear up/down Pants- Performed by patient: Pull pants up/down Pants- Performed by helper: Thread/unthread right pants leg, Thread/unthread left pants leg, Pull pants up/down   Non-skid slipper socks- Performed by helper: Don/doff right sock, Don/doff left sock               TED Hose - Performed by helper: Don/doff right TED hose, Don/doff left  TED hose  Lower body assist Assist for lower body dressing:  (total A)      Toileting Toileting Toileting activity did not occur: No continent bowel/bladder event Toileting steps completed by patient: Adjust clothing after toileting Toileting steps completed by helper: Adjust clothing prior to toileting, Performs perineal hygiene Toileting Assistive Devices: Grab bar or rail  Toileting assist Assist level: Touching or steadying assistance (Pt.75%)   Transfers Chair/bed transfer    Chair/bed transfer method: Other Chair/bed transfer assist level: 2 helpers Chair/bed transfer assistive device: Mechanical lift Mechanical lift: Maximove   Locomotion Ambulation Ambulation activity did not occur: Safety/medical concerns         Wheelchair   Type: Manual Max wheelchair distance: 30 ft (BLE) Assist Level: Dependent (Pt equals 0%)  Cognition Comprehension Comprehension assist level: Follows complex conversation/direction with extra time/assistive device  Expression Expression assist level: Expresses complex ideas: With extra time/assistive device  Social Interaction Social Interaction assist level: Interacts appropriately 75 - 89% of the time - Needs redirection for appropriate language or to initiate interaction.  Problem Solving Problem solving assist level: Solves basic 75 - 89% of the time/requires cueing 10 - 24% of the time  Memory Memory assist level: Recognizes or recalls 90% of the time/requires cueing < 10% of the time   Medical Problem List and Plan: 1.  Debilitation secondary to sepsis as well as history of bilateral SDH status post craniotomy 12/16/2016  -continue therapies with PT, OT---slow progress due to poor stamina, mental status  2.  DVT Prophylaxis/Anticoagulation: see below, SCD's 3. Pain Management: have stopped oxycodone due to confusion  -  4. Mood: Xanax 0.25 mg daily at bedtime, Lexapro 10 mg daily at bedtime---decreased to   -denies depression    -neuropsych following.   -continue to provide emotional support as possible 5. Neuropsych: This patient is capable of making decisions on his own behalf.  -holding all potentially neurosedating meds (except xanax)  -urine cx pending 6. Skin/Wound Care: Routine skin checks 7. Fluids/Electrolytes/Nutrition: encourage PO. Didn't eat as well yesterday  -recheck labs tomorrow 8. Seizure prophylaxis. Keppra 500 mg twice a day 9. ID/bacteremia. Septra since 4/8 10. COPD/OSA. Check oxygen  saturations every shift 11. Atrial fibrillation/AVR/CABG/PPM. Continue low-dose aspirin as well as amiodarone 200 mg twice a day. Chronic Coumadin discontinued due to subdural hematomas 12. Diastolic congestive heart failure. Lasix 20 mg daily.--held due to low bp's  -daily weights---weight at 101kg today    13. Hypertension. Currently with hypotension. Toprol-XL 12.5 mg daily on hold. Hold lasix too 14. Hyperlipidemia. Lipitor 15. Prostatic abscess: S/p TURP. Cont bactrim through 4/28 16. ABLA: Monitor CBC 17. LUE DVT: Brachial vein. Not an anticoagulation candidate due to recent subdural hematoma and craniotomy  -continue to elevate/conservative care--  -edema slowly improving  LOS (Days) 10 A FACE TO FACE EVALUATION WAS PERFORMED  [name], MD 02/11/2017 7:50 AM

## 2017-02-12 ENCOUNTER — Inpatient Hospital Stay (HOSPITAL_COMMUNITY): Payer: Medicare Other | Admitting: Physical Therapy

## 2017-02-12 ENCOUNTER — Inpatient Hospital Stay (HOSPITAL_COMMUNITY): Payer: Medicare Other | Admitting: Occupational Therapy

## 2017-02-12 DIAGNOSIS — F09 Unspecified mental disorder due to known physiological condition: Secondary | ICD-10-CM

## 2017-02-12 NOTE — Consult Note (Signed)
Consultation Note Date: 02/12/2017   Patient Name: Alex [name]  DOB: 1931-08-24  MRN: [id number]  Age / Sex: 83 y.o., male  PCP: [name], MD Referring Physician: [name], MD  Reason for Consultation: Establishing goals of care  HPI/Patient Profile: 83 y.o. male   admitted on 02/01/2017    Clinical Assessment and Goals of Care:  Alex [name] is well known to the palliative service, he has been seen by palliative providers in previous hospitalizations recently. He has a past medical history significant for atrial fibrillation, aortic valve replacement, status post permanent pacemaker placement, history of coronary artery bypass grafting, chronic obstructive pulmonary disease, obstructive sleep apnea, diastolic congestive heart failure, hypertension, dyslipidemia. Patient has had a gradual progressive decline essentially since the last 2-3 months, has required hospitalization for subdural hematoma, pneumonia, prostatic abscess, left upper extremity deep vein thrombosis.  [facility] the past several days. Family has requested a repeat palliative consultation for readdressing goals of care. A family meeting was undertaken with the patient, his wife, son, daughter-in-law, Alex [name], family representative and advocate Alex [name] who is also a hospice and palliative care center of [facility] liaison.  Brief life review performed. Patient lives in [place] with his wife. He was able to ambulate prior to this acute-subacute changes that have been going on for the past few months. Patient likes his chickens and goats. We discussed about what is important to the patient, we discussed about goals and wishes.  Patient's hospital course as well as course in [facility] reviewed in detail. At times, the patient is getting confused. Patient complains of  generalized weakness, he believes he may have had minimal physical strength recovery but not much.  We discussed about disposition options and what is important to the patient. We basically discussed about her different pathways-one that would involve continuing efforts at physical therapy and rehabilitation in a skilled nursing facility level of care versus another one that would focus exclusively on comfort and would entail a more hospice philosophy of care. All questions and concerns addressed and discussed in detail. See recommendations below. Thank you for the consult.  NEXT OF KIN  wife son  SUMMARY OF RECOMMENDATIONS    Disposition: recommend SNF [facility] facility, attempt additional rehab with palliative services following after discharge.  Family requests a CBC to be added to blood work scheduled for tomorrow.  Gentle discussions about how to proceed in the future, should the patient decide to focus on comfort care also undertaken in detail.  Thank you for the opportunity to participate in Alex [name]' care.   Code Status/Advance Care Planning:  DNR    Symptom Management:    as above   Palliative Prophylaxis:   Delirium Protocol     Psycho-social/Spiritual:   Desire for further Chaplaincy support:no  Additional Recommendations: Caregiving  Support/Resources  Prognosis:   Unable to determine  Discharge Planning: Skilled Nursing Facility for rehab with Palliative care service follow-up  Primary Diagnoses: Present on Admission: . (Resolved) Physical debility . Chronic obstructive pulmonary disease (HCC) . PAF (paroxysmal atrial fibrillation) (HCC) . Acute blood loss anemia . Acute deep vein thrombosis (DVT) of left upper extremity (HCC)   I have reviewed the medical record, interviewed the patient and family, and examined the patient. The following aspects are pertinent.  Past Medical History:  Diagnosis Date  . Aortic stenosis    s/p AVR by Dr Laneta Simmers   . COPD (chronic obstructive pulmonary disease) (HCC)   . History of coronary artery disease    status post stenting of the marginal circumflex in 12/2003 and again in 2009  . Hyperlipidemia   . Morbid obesity (HCC)    weight 243 pounds, BMI 31.2kg/m2, BSA 2.36 square meters  . Obstructive sleep apnea    compliant with CPAP  . Persistent atrial fibrillation [facility])    Social History   Social History  . Marital status: Married    Spouse name: N/A  . Number of children: N/A  . Years of education: N/A   Social History Main Topics  . Smoking status: Former Smoker    Packs/day: 0.50    Years: 30.00    Types: Cigarettes    Start date: 10/27/1973    Quit date: 10/28/2003  . Smokeless tobacco: Never Used  . Alcohol use 0.0 oz/week     Comment: 8 oz of wine per night  . Drug use: No  . Sexual activity: Not Asked   Other Topics Concern  . None   Social History Narrative   Lives in [place] with spouse.  Owns a Designer, industrial/product.   Family History  Problem Relation Age of Onset  . Alzheimer's disease Mother   . Tuberculosis Father   . Other Father     Spinal Meningitis   Scheduled Meds: . ALPRAZolam  0.25 mg Oral QHS  . amiodarone  200 mg Oral BID  . aspirin EC  81 mg Oral Daily  . atorvastatin  40 mg Oral q1800  . enoxaparin (LOVENOX) injection  40 mg Subcutaneous Q24H  . escitalopram  5 mg Oral QHS  . hydrocerin   Topical BID  . levETIRAcetam  500 mg Oral BID  . magnesium oxide  200 mg Oral Daily  . senna-docusate  1 tablet Oral BID  . sulfamethoxazole-trimethoprim  1 tablet Oral Q12H   Continuous Infusions: PRN Meds:.acetaminophen, alum & mag hydroxide-simeth, bisacodyl, diphenhydrAMINE, guaiFENesin-dextromethorphan, ipratropium-albuterol, polyethylene glycol, prochlorperazine **OR** prochlorperazine **OR** prochlorperazine, sodium chloride, traZODone Medications Prior to Admission:  Prior to Admission medications   Medication Sig Start Date End Date  Taking? Authorizing Provider  acetaminophen (TYLENOL) 500 MG tablet Take 1,000 mg by mouth every 6 (six) hours as needed.    Historical Provider, MD  ALPRAZolam Prudy Feeler) 0.25 MG tablet Take 1 tablet (0.25 mg total) by mouth at bedtime. 01/09/17   Mcarthur Rossetti Angiulli, PA-C  amiodarone (PACERONE) 200 MG tablet Take 1 tablet (200 mg total) by mouth 2 (two) times daily. 01/09/17   Mcarthur Rossetti Angiulli, PA-C  aspirin 81 MG tablet Take 81 mg by mouth daily.    Historical Provider, MD  atorvastatin (LIPITOR) 40 MG tablet Take 1 tablet (40 mg total) by mouth daily. 01/10/17   Mcarthur Rossetti Angiulli, PA-C  B Complex Vitamins (VITAMIN B COMPLEX PO) Take 1 tablet by mouth daily.     Historical Provider, MD  escitalopram (LEXAPRO) 10 MG tablet Take 1 tablet (10 mg total) by mouth at bedtime. 01/09/17  Mcarthur Rossetti Angiulli, PA-C  furosemide (LASIX) 20 MG tablet Take 1 tablet ( ) by mouth daily. May take extra tablet for weight gain >3lbs 01/09/17   Mcarthur Rossetti Angiulli, PA-C  ipratropium-albuterol (DUONEB) 0.5-2.5 (3) MG/3ML SOLN Take 3 mLs by nebulization every 6 (six) hours as needed. 02/01/17   Vassie Loll, MD  levETIRAcetam (KEPPRA) 500 MG tablet Take 1 tablet (500 mg total) by mouth 2 (two) times daily. 01/09/17   Mcarthur Rossetti Angiulli, PA-C  Magnesium 250 MG TABS Take 1 tablet (250 mg total) by mouth daily. 11/04/16   Newman Nip, NP  metoprolol succinate (TOPROL-XL) 25 MG 24 hr tablet Take 0.5 tablets (12.5 mg total) by mouth daily. 01/10/17   Mcarthur Rossetti Angiulli, PA-C  potassium chloride SA (K-DUR,KLOR-CON) 20 MEQ tablet Take 1 tablet (20 mEq total) by mouth daily. 01/10/17   Mcarthur Rossetti Angiulli, PA-C  sulfamethoxazole-trimethoprim (BACTRIM DS,SEPTRA DS) 800-160 MG tablet Take 1 tablet by mouth every 12 (twelve) hours. 01/31/17   Vassie Loll, MD   No Known Allergies Review of Systems + for confusion,  + for weakness  Physical Exam Appears pale and weak Regular work of breathing, still on supplemental O2, was not on home  O2 Regular LUE edema is much better Trace edema Awake alert, in pleasant spirits  Vital Signs: BP 135/78 (BP Location: Right Arm)   Pulse 80   Temp 98.2 F (36.8 C) (Oral)   Resp 18   Wt 98.7 kg (217 lb 9.5 oz)   SpO2 98%   BMI 27.94 kg/m  Pain Assessment: 0-10   Pain Score: Asleep   SpO2: SpO2: 98 % O2 Device:SpO2: 98 % O2 Flow Rate: .O2 Flow Rate (L/min): 2 L/min  IO: Intake/output summary:  Intake/Output Summary (Last 24 hours) at 02/12/17 1202 Last data filed at 02/12/17 0830  Gross per 24 hour  Intake              360 ml  Output              350 ml  Net               10 ml    LBM: Last BM Date: 02/12/17 Baseline Weight: Weight: 106.8 kg (235 lb 7.2 oz) Most recent weight: Weight: 98.7 kg (217 lb 9.5 oz)     Palliative Assessment/Data:   Flowsheet Rows     Most Recent Value  Intake Tab  Referral Department  Hospitalist  Unit at Time of Referral  Other (Comment) [[facility] ]  Palliative Care Primary Diagnosis  Other (Comment)  Palliative Care Type  Return patient Palliative Care  Reason for referral  Clarify Goals of Care  Date first seen by Palliative Care  02/12/17  Clinical Assessment  Palliative Performance Scale Score  30%  Pain Max last 24 hours  0  Pain Min Last 24 hours  0  Dyspnea Max Last 24 Hours  3  Dyspnea Min Last 24 hours  2  Nausea Max Last 24 Hours  0  Nausea Min Last 24 Hours  0  Anxiety Max Last 24 Hours  1  Anxiety Min Last 24 Hours  0  Psychosocial & Spiritual Assessment  Palliative Care Outcomes  Patient/Family meeting held?  Yes  Who was at the meeting?  patient wife pastor son daughter in law, family representative   Palliative Care Outcomes  Clarified goals of care      Time In:  10 Time Out:  11.10 Time Total:  70 min  Greater than 50%  of this time was spent counseling and coordinating care related to the above assessment and plan.  Signed by: Rosalin Hawking, MD  [phone]  Please contact Palliative  Medicine Team phone at [phone] for questions and concerns.  For individual provider: See Loretha Stapler

## 2017-02-12 NOTE — Progress Notes (Signed)
Occupational Therapy Session Note  Patient Details  Name: Alex [name] MRN: [id number] Date of Birth: 01-12-1931  Today's Date: 02/12/2017 OT Individual Time: 1445-1530 OT Individual Time Calculation (min): 45 min    Short Term Goals: Week 2:  OT Short Term Goal 1 (Week 2): Pt will perform LB dressing with max A in order to decrease level of assist with functional task.  OT Short Term Goal 2 (Week 2): Pt will demonstrate min A dynamic sitting balance for 5 minutes during functional task.  OT Short Term Goal 3 (Week 2): Pt will perform UB dressing with min A in order to increase I with self care. OT Short Term Goal 4 (Week 2): Pt will perform toilet transfer with max A of 1 in order to reduce level of assitance with functional transfers.  Skilled Therapeutic Interventions/Progress Updates:    Upon entering the room, pt sleeping and difficulty to awaken for OT intervention. Pt stating fatigue and reported, " It's been a long day." Pt refused sitting EOB or transfer into wheelchair. Pt was agreeable to B UE strengthening exercises with use of 2 lbs resistive dowel rod. OT demonstrating exercises and pt performed 3 sets of 10 bicep curls, straight arm raises, chest presses, and shoulder elevation with min cues for proper technique. Pt needing rest breaks between sets and falling asleep as therapist exited the room. Call bell and all needed items within reach upon exiting the room.   Therapy Documentation Precautions:  Precautions Precautions: Fall Precaution Comments: monitor O2, pt has LUE DVT (brachial) & currently not candidate for anticoagulation, recent hx of traumatic R SDH Restrictions Weight Bearing Restrictions: No General:   Vital Signs: Therapy Vitals Temp: 97.5 F (36.4 C) Temp Source: Oral Pulse Rate: 60 Resp: 17 BP: 98/64 Patient Position (if appropriate): Lying Oxygen Therapy SpO2: 100 % O2 Device: Nasal Cannula O2 Flow Rate (L/min): 2 L/min Pain:    ADL: ADL ADL Comments: See functional assessment tool Vision/Perception     Exercises:   Other Treatments:    See Function Navigator for Current Functional Status.   Therapy/Group: Individual Therapy  Alen Bleacher 02/12/2017, 4:24 PM

## 2017-02-12 NOTE — Patient Care Conference (Signed)
Inpatient RehabilitationTeam Conference and Plan of Care Update Date: 02/10/2017   Time: 2:40 PM    Patient Name: Alex [name]      Medical Record Number: [id number]  Date of Birth: January 11, 1931 Sex: Male         Room/Bed: 4W07C/4W07C-01 Payor Info: Payor: [employer] MEDICARE / Plan: [facility] MEDICARE / Product Type: *No Product type* /    Admitting Diagnosis: Debility Turp  Admit Date/Time:  02/01/2017 10:12 AM Admission Comments: No comment available   Primary Diagnosis:  Debility Principal Problem: Debility  Patient Active Problem List   Diagnosis Date Noted  . Cognitive disorder   . Debility 02/01/2017  . History of subdural hematoma   . Anxiety state   . Seizure prophylaxis   . Bacteremia   . Pacemaker   . Chronic diastolic congestive heart failure (HCC)   . Benign essential HTN   . Hypotension   . Hyperlipidemia   . Prostatic abscess   . Acute blood loss anemia   . Acute deep vein thrombosis (DVT) of left upper extremity (HCC)   . BPH with urinary obstruction: Per urology 01/29/2017 01/29/2017  . FUO (fever of unknown origin)   . Prostate abscess   . Fever   . HCAP (healthcare-associated pneumonia)   . Bacteremia due to Klebsiella pneumoniae   . Left arm swelling   . Transient hypotension 01/22/2017  . Sepsis (HCC) 01/21/2017  . Peripheral edema   . Bacterial UTI   . Urinary retention   . Hypertension   . Reactive hypertension   . Transaminitis   . Traumatic brain injury with loss of consciousness of 31 minutes to 59 minutes (HCC) 12/19/2016  . Acute pulmonary edema (HCC) 12/12/2016  . Acute diastolic CHF (congestive heart failure) (HCC) 12/12/2016  . Atrial flutter (HCC) 12/12/2016  . Traumatic subdural bleed with LOC of 1 hour to 5 hours 59 minutes (HCC) 12/11/2016  . Class 1 obesity due to excess calories with body mass index (BMI) of 30.0 to 30.9 in adult   . ETOH abuse   . Chronic obstructive pulmonary disease (HCC)   . Supplemental oxygen dependent   .  Alcohol use   . OSA on CPAP   . Atrial fibrillation (HCC)   . Coronary artery disease involving coronary bypass graft of native heart without angina pectoris   . Seizures (HCC)   . Dysphagia   . Bradycardia   . Hyperglycemia   . Agitation   . Hypokalemia   . Hypernatremia   . Leukocytosis   . Macrocytic anemia   . Thrombocytopenia (HCC)   . Traumatic subdural hematoma without loss of consciousness (HCC)   . Change in mental status   . Intracranial hematoma (HCC) 12/03/2016  . Sick sinus syndrome (HCC) 11/06/2015  . PAF (paroxysmal atrial fibrillation) (HCC) 11/14/2014  . SOB (shortness of breath) 10/31/2014  . Persistent atrial fibrillation (HCC) 10/31/2014  . Aortic stenosis, severe 07/28/2011  . 3-vessel coronary artery disease 07/28/2011  . Hyperlipidemia, mixed 07/28/2011    Expected Discharge Date: Expected Discharge Date: 02/27/17 (vs SNF)  Team Members Present: Physician leading conference: Arley Phenix, PsyD;Dr. Faith Rogue Social Worker Present: Amada Jupiter, LCSW Nurse Present: Carmie End, RN PT Present: Karolee Stamps, PT;Victoria Hyacinth Meeker, PT OT Present: Callie Fielding, OT SLP Present: Fae Pippin, SLP PPS Coordinator present : Tora Duck, RN, CRRN     Current Status/Progress Goal Weekly Team Focus  Medical   pt still with poor activity tolerance, denies depression. eating better.  holding bp meds  improve standing tolerance  volume/bp mgt, nutrition   Bowel/Bladder   incontinent of bladder, condom cath at [facility], continent of bowel. LBM 02/05/17  to be continent of bowel/bladder with min assist  Assist with toileting needs, timed toileting q 2hrs and as needed   Swallow/Nutrition/ Hydration             ADL's   BADLs Mod-Max A bedlevel;  Mod A sitting balance, and bed mobility, +2 (or 3) functional transfers or maximove  Supervision-Min A BADLs and functional transfers   sitting balance, functional transfers, cognition, pt/family ed, endurance, adaptive  bathing/dressing skills   Mobility   maxi move for bed<>chair transfers, poor activity tolerance, mod assist rolling in bed  min assist overall  activity tolerance, strengthening, endurance, pt education, bed mobility, transfers   Communication             Safety/Cognition/ Behavioral Observations            Pain   no c/o pain  <2  assess pain q shift and as needed   Skin   MASD to bottom, barrier cream in use, L arm edema DVT  no new skin breakdown/infection  Assess skin q shift and as needed barrier cream    Rehab Goals Patient on target to meet rehab goals: No Rehab Goals Revised: anticipate may need to downgrade goals as pt making slow progress. *See Care Plan and progress notes for long and short-term goals.  Barriers to Discharge: age, poor stamina, low bp's    Possible Resolutions to Barriers:  continued pacing, adaptive equipment, lower intensity of therapy?    Discharge Planning/Teaching Needs:  Home with spouse who can provide supervision/ and private duty caregivers, however, concern that continues to make slow progress and need to consider SNF.      Team Discussion:  Very little progress with therapies this past week. PO is better;  Labs ok but may need to recheck urine.  Anticipate downgrading goals to moderate assist but these may be lofty goals.  Didn't recognize his wife yesterday.  Team feels that SNF is the best option.  SW to meet with pt and family tomorrow to discuss.  Revisions to Treatment Plan:  Not at this time.   Continued Need for Acute Rehabilitation Level of Care: The patient requires daily medical management by a physician with specialized training in physical medicine and rehabilitation for the following conditions: Daily direction of a multidisciplinary physical rehabilitation program to ensure safe treatment while eliciting the highest outcome that is of practical value to the patient.: Yes Daily medical management of patient stability for  increased activity during participation in an intensive rehabilitation regime.: Yes  Tranise Forrest 02/12/2017, 12:36 PM

## 2017-02-12 NOTE — Progress Notes (Signed)
Physical Therapy Session Note  Patient Details  Name: Alex [name] MRN: [id number] Date of Birth: [birth year]  Today's Date: 02/12/2017 PT Individual Time: 1103-1207 PT Individual Time Calculation (min): 64 min   Short Term Goals: Week 2:  PT Short Term Goal 1 (Week 2): Pt will perform all bed mobility tasks with mod assist +1. PT Short Term Goal 2 (Week 2): Pt will transfer sit<>stand with max assist +1. PT Short Term Goal 3 (Week 2): Pt will transfer bed<>w/c with max assist +1.  Skilled Therapeutic Interventions/Progress Updates:    Pt up in w/c upon arrival, agreeable to PT session. Pt transported to gym via TIS w/c. Transfers: w/c<>mat table +2 max assist with cues for technique. Wanting to lean posterior during transfers. Sit<>stand using stedy performed with +2 max assist. Once in stedy, repeating sit<>stand X2 with assist at pelvis and knees. Sitting balance activities performed with reaching at variable angles and catch/throwing ball. Following session, pt returned to room, quick release belt on and all needs in reach.    Therapy Documentation Precautions:  Precautions Precautions: Fall Precaution Comments: monitor O2, pt has LUE DVT (brachial) & currently not candidate for anticoagulation, recent hx of traumatic R SDH Restrictions Weight Bearing Restrictions: No   Vital Signs:  SpO2 - 94-97% on 2L during session. HR 100 with activity.  Pain:  Denies pain.      See Function Navigator for Current Functional Status.   Therapy/Group: Individual Therapy  Delton See, PT 02/12/2017, 12:15 PM

## 2017-02-12 NOTE — Progress Notes (Addendum)
[facility]     PROGRESS NOTE    Subjective/Complaints: Lying in bed. Working on breakfast. Denies pain. Commented that I was "late today"   ROS: pt denies nausea, vomiting, diarrhea, cough, shortness of breath or chest pain      Objective: Vital Signs: Blood pressure 135/78, pulse 80, temperature 98.2 F (36.8 C), temperature source Oral, resp. rate 18, weight 98.7 kg (217 lb 9.5 oz), SpO2 98 %. No results found. No results for input(s): WBC, HGB, HCT, PLT in the last 72 hours. No results for input(s): NA, K, CL, GLUCOSE, BUN, CREATININE, CALCIUM in the last 72 hours.  Invalid input(s): CO CBG (last 3)   Recent Labs  02/10/17 2110 02/11/17 0656  GLUCAP 114* 84    Wt Readings from Last 3 Encounters:  02/12/17 98.7 kg (217 lb 9.5 oz)  02/01/17 106 kg (233 lb 11 oz)  01/10/17 91.8 kg (202 lb 6.1 oz)    Physical Exam:  Constitutional: no distress.  Obese  HENT:  Head: Normocephalic. External ears normal. Eyes: Conjunctivae and EOM are normal. Right eye exhibits no discharge. Left eye exhibits no discharge.  Neck: Normal range of motion. Neck supple.  Cardiovascular:  IRR Respiratory: clear.  GI: Soft. Bowel sounds are normal.  Musculoskeletal: He exhibits 1+ edema LUE, I see mild weeping yesterday Neurological: awake, recognizes me and realized I was in a bit later than yesterday . Oriented to month/year with extra time Motor: 4-/5 UE. 3/5 HF,KE 4/5 distal.  Sensation intact to light touch  Skin: Skin is warm and dry.  Psychiatric: He has a normal mood and affect. His behavior is normal.   Assessment/Plan: 1. Functional deficits secondary to debility after sepsis/multiple medical which require 3+ hours per day of interdisciplinary therapy in a comprehensive inpatient rehab setting. Physiatrist is providing close team supervision and 24 hour management of active medical problems listed below. Physiatrist and rehab team continue  to assess barriers to discharge/monitor patient progress toward functional and medical goals.  Function:  Bathing Bathing position   Position: Other (comment) (LB in bed and UB at sink)  Bathing parts Body parts bathed by patient: Right arm, Chest, Abdomen, Left arm, Front perineal area Body parts bathed by helper: Buttocks, Right lower leg, Left lower leg, Back, Right upper leg, Left upper leg  Bathing assist Assist Level:  (max A)      Upper Body Dressing/Undressing Upper body dressing   What is the patient wearing?: Pull over shirt/dress     Pull over shirt/dress - Perfomed by patient: Thread/unthread right sleeve, Thread/unthread left sleeve Pull over shirt/dress - Perfomed by helper: Put head through opening, Pull shirt over trunk        Upper body assist Assist Level:  (mod a)   Set up : To obtain clothing/put away  Lower Body Dressing/Undressing Lower body dressing   What is the patient wearing?: Pants, Non-skid slipper socks, Ted Hose, Underwear   Underwear - Performed by helper: Thread/unthread right underwear leg, Thread/unthread left underwear leg, Pull underwear up/down Pants- Performed by patient: Pull pants up/down Pants- Performed by helper: Thread/unthread right pants leg, Thread/unthread left pants leg, Pull pants up/down   Non-skid slipper socks- Performed by helper: Don/doff right sock, Don/doff left sock               TED Hose - Performed by helper: Don/doff right TED hose, Don/doff left TED hose  Lower body assist Assist for lower body dressing:  (total  A)      Toileting Toileting Toileting activity did not occur: No continent bowel/bladder event Toileting steps completed by patient: Adjust clothing after toileting Toileting steps completed by helper: Adjust clothing prior to toileting, Performs perineal hygiene Toileting Assistive Devices: Grab bar or rail  Toileting assist Assist level: Touching or steadying assistance (Pt.75%)    Transfers Chair/bed transfer   Chair/bed transfer method: Lateral scoot Chair/bed transfer assist level: 2 helpers Chair/bed transfer assistive device: Sliding board Mechanical lift: Maximove   Locomotion Ambulation Ambulation activity did not occur: Safety/medical concerns         Wheelchair   Type: Manual Max wheelchair distance: 30 ft (BLE) Assist Level: Dependent (Pt equals 0%)  Cognition Comprehension Comprehension assist level: Follows basic conversation/direction with extra time/assistive device  Expression Expression assist level: Expresses basic needs/ideas: With extra time/assistive device  Social Interaction Social Interaction assist level: Interacts appropriately 75 - 89% of the time - Needs redirection for appropriate language or to initiate interaction.  Problem Solving Problem solving assist level: Solves basic 75 - 89% of the time/requires cueing 10 - 24% of the time  Memory Memory assist level: Recognizes or recalls 90% of the time/requires cueing < 10% of the time   Medical Problem List and Plan: 1.  Debilitation secondary to sepsis as well as history of bilateral SDH status post craniotomy 12/16/2016  -pt continues to struggle to progress  -stamina poor, cognition an issue  -have requested palliative care consult as family would like guidance for his care moving forward  -he is DNR  2.  DVT Prophylaxis/Anticoagulation: see below, SCD's 3. Pain Management: have stopped oxycodone due to confusion  - tylenol prn 4. Mood: Xanax 0.25 mg daily at bedtime, Lexapro 10 mg daily at bedtime---decreased to   -denies depression     -neuropsych following along   -continue to provide emotional support as possible 5. Neuropsych: This patient is capable of making decisions on his own behalf.  -holding all potentially neurosedating meds (except xanax)  -urine cx pending  -check ammonia level tomorrow with labs 6. Skin/Wound Care: Routine skin checks 7.  Fluids/Electrolytes/Nutrition: encourage PO.  -albumin low but intake generally reasonable, check prealbumin/cmet tomorrow  -recent labs within acceptable limits.  8. Seizure prophylaxis. Keppra 500 mg twice a day 9. ID/bacteremia. Septra since 4/8 10. COPD/OSA. Check oxygen saturations every shift 11. Atrial fibrillation/AVR/CABG/PPM. Continue low-dose aspirin as well as amiodarone 200 mg twice a day. Chronic Coumadin discontinued due to subdural hematomas 12. Diastolic congestive heart failure. Lasix 20 mg daily.--held due to low bp's  -daily weights---weight at 99kg today   -may be worthwhile to have cardiology weigh in on poor exercise tolerance, persistently low bp's despite being off meds.  13. Hypertension. Currently with hypotension. Toprol-XL 12.5 mg daily on hold. Holding lasix too 14. Hyperlipidemia. Lipitor 15. Prostatic abscess: S/p TURP. Cont bactrim through 4/28 16. ABLA: Monitor CBC 17. LUE DVT: Brachial vein. Not an anticoagulation candidate due to recent subdural hematoma and craniotomy  -continue to elevate/conservative care--  -edema slowly improving, with intermittent weeping  LOS (Days) 11 A FACE TO FACE EVALUATION WAS PERFORMED  [name], MD 02/12/2017 8:43 AM

## 2017-02-12 NOTE — Progress Notes (Signed)
Social Work Patient ID: Alex [name], male   DOB: 27-Jun-1931, 83 y.o.   MRN: [id number]   Met with pt, family, Palliative Care MD and pt's pastor this morning to discuss pt's current functional limitations, his limited progress in CIR therapies and d/c planning issues.  Pt involved in discussion and listened to input from family, MD and pastor.  Pt expressed his desire to continue working with therapy and reports he has "not given up."  I presented the issue that, given plan to revise goals to mod assist and therapy concerns that these may be lofty, if he wishes to return home then would need to discuss new care levels with private duty agency.  I explained that they could possibly require that a 2nd private duty caregiver be involved for the safety of all involved.  Family friend, Alex [name] and son, [name], spoke of possibly changing the plan to SNF.  Pt has refused this option in the past, however, today he agreed to change of plan and asked if he could still go out of building to "visit the chickens and goats."  This was a very open, productive meeting and will alert tx team to change of plan.  Will begin bed search process.  Abbie Jablon, LCSW

## 2017-02-12 NOTE — Progress Notes (Signed)
Physical Therapy Session Note  Patient Details  Name: Alex [name] MRN: [id number] Date of Birth: May 29, 1931  Today's Date: 02/12/2017 PT Individual Time: 0902-1002 PT Individual Time Calculation (min): 60 min    Skilled Therapeutic Interventions/Progress Updates:    Pt remained on 2.5 L to 3 L 02 via nasal canula t/o session.  Pt denies pain upon therapist arrival.  Also reports that pt wife slept in the room last night, however, upon his wife arrival, she reports that she just got there.  Pt performed rolling b/l and scooting up in bed with B LE bridge today as well as transfers with slide board to wheelchair x 3 with mod assist x 2.  Pt additionally worked on sitting balance on edge of bed x 2 min and again for approx 7 min while kicking a ball.  Treatment session focused on improving mobility and endurance overall to decreased burden of care.  Following session, pt was left in care of family for family conference at 10AM.   HR:  77 bpm and Sp02>95% t/o session.  Therapy Documentation Precautions:  Precautions Precautions: Fall Precaution Comments: monitor O2, pt has LUE DVT (brachial) & currently not candidate for anticoagulation, recent hx of traumatic R SDH Restrictions Weight Bearing Restrictions: No   See Function Navigator for Current Functional Status.   Therapy/Group: Individual Therapy  Tonae Livolsi [name] 02/12/2017, 11:06 AM

## 2017-02-12 NOTE — Progress Notes (Signed)
Patient was assisted OOBV to [facility] to have a bowel movement, patient experienced a vagal response without any loss of consciousness, was assisted back to bed, VS 132/74/ P 88.

## 2017-02-12 NOTE — Plan of Care (Signed)
Problem: RH BOWEL ELIMINATION Goal: RH STG MANAGE BOWEL WITH ASSISTANCE STG Manage Bowel with Assistance. Mod assist  Outcome: Progressing Was medicated with Bisacodyl suppository with no relief, was disimpacted and given Fleet Enema with large result  Problem: RH SKIN INTEGRITY Goal: RH STG SKIN FREE OF INFECTION/BREAKDOWN Outcome: Progressing Stage 1 pressure sore to sacrum covered with pink foam

## 2017-02-13 ENCOUNTER — Inpatient Hospital Stay (HOSPITAL_COMMUNITY): Payer: Medicare Other | Admitting: Physical Therapy

## 2017-02-13 ENCOUNTER — Inpatient Hospital Stay (HOSPITAL_COMMUNITY): Payer: Medicare Other

## 2017-02-13 DIAGNOSIS — I951 Orthostatic hypotension: Secondary | ICD-10-CM

## 2017-02-13 DIAGNOSIS — I481 Persistent atrial fibrillation: Secondary | ICD-10-CM

## 2017-02-13 DIAGNOSIS — R7989 Other specified abnormal findings of blood chemistry: Secondary | ICD-10-CM

## 2017-02-13 LAB — CBC WITH DIFFERENTIAL/PLATELET
BASOS ABS: 0 10*3/uL (ref 0.0–0.1)
Basophils Relative: 1 %
Eosinophils Absolute: 0.2 10*3/uL (ref 0.0–0.7)
Eosinophils Relative: 3 %
HEMATOCRIT: 31.3 % — AB (ref 39.0–52.0)
HEMOGLOBIN: 9.6 g/dL — AB (ref 13.0–17.0)
LYMPHS ABS: 2.4 10*3/uL (ref 0.7–4.0)
LYMPHS PCT: 32 %
MCH: 29.9 pg (ref 26.0–34.0)
MCHC: 30.7 g/dL (ref 30.0–36.0)
MCV: 97.5 fL (ref 78.0–100.0)
Monocytes Absolute: 0.7 10*3/uL (ref 0.1–1.0)
Monocytes Relative: 10 %
NEUTROS ABS: 4 10*3/uL (ref 1.7–7.7)
NEUTROS PCT: 54 %
PLATELETS: 219 10*3/uL (ref 150–400)
RBC: 3.21 MIL/uL — AB (ref 4.22–5.81)
RDW: 17.5 % — ABNORMAL HIGH (ref 11.5–15.5)
WBC: 7.3 10*3/uL (ref 4.0–10.5)

## 2017-02-13 LAB — COMPREHENSIVE METABOLIC PANEL
ALT: 27 U/L (ref 17–63)
ANION GAP: 6 (ref 5–15)
AST: 36 U/L (ref 15–41)
Albumin: 1.9 g/dL — ABNORMAL LOW (ref 3.5–5.0)
Alkaline Phosphatase: 92 U/L (ref 38–126)
BUN: 9 mg/dL (ref 6–20)
CHLORIDE: 105 mmol/L (ref 101–111)
CO2: 28 mmol/L (ref 22–32)
CREATININE: 0.96 mg/dL (ref 0.61–1.24)
Calcium: 8.3 mg/dL — ABNORMAL LOW (ref 8.9–10.3)
Glucose, Bld: 83 mg/dL (ref 65–99)
POTASSIUM: 4.3 mmol/L (ref 3.5–5.1)
SODIUM: 139 mmol/L (ref 135–145)
Total Bilirubin: 0.6 mg/dL (ref 0.3–1.2)
Total Protein: 5.5 g/dL — ABNORMAL LOW (ref 6.5–8.1)

## 2017-02-13 LAB — AMMONIA: AMMONIA: 61 umol/L — AB (ref 9–35)

## 2017-02-13 LAB — URINE CULTURE: Culture: 30000 — AB

## 2017-02-13 LAB — PREALBUMIN: PREALBUMIN: 14 mg/dL — AB (ref 18–38)

## 2017-02-13 MED ORDER — LACTULOSE 10 GM/15ML PO SOLN
30.0000 g | Freq: Every day | ORAL | Status: DC
  Administered 2017-02-14 – 2017-02-17 (×4): 30 g via ORAL
  Filled 2017-02-13 (×4): qty 45

## 2017-02-13 MED ORDER — AMIODARONE HCL 200 MG PO TABS
200.0000 mg | ORAL_TABLET | Freq: Every day | ORAL | Status: DC
Start: 2017-02-14 — End: 2017-02-18
  Administered 2017-02-14 – 2017-02-18 (×5): 200 mg via ORAL
  Filled 2017-02-13 (×5): qty 1

## 2017-02-13 MED ORDER — FUROSEMIDE 20 MG PO TABS
20.0000 mg | ORAL_TABLET | ORAL | Status: DC
  Administered 2017-02-13 – 2017-02-18 (×3): 20 mg via ORAL
  Filled 2017-02-13 (×3): qty 1

## 2017-02-13 MED ORDER — LACTULOSE 10 GM/15ML PO SOLN
30.0000 g | Freq: Once | ORAL | Status: AC
  Administered 2017-02-13: 30 g via ORAL
  Filled 2017-02-13: qty 45

## 2017-02-13 NOTE — Progress Notes (Signed)
Physical Therapy Session Note  Patient Details  Name: Alex [name] MRN: [id number] Date of Birth: May 29, 1931  Today's Date: 02/13/2017 PT Individual Time: 0758-830 PT Individual Time Calculation (min): 2 min   Skilled Therapeutic Interventions/Progress Updates:    Sp02 on 1 L O2 today remained >92%.  Pt denied pain upon therapist entering room and reports wanting to get up to eat breakfast.  Pt transfers supine to sit with mod A from therapist and sat edge of bed x approx 7 min while eating breakfast to work on sitting balance endurance.  Pt then performed sit to stand with Steady and bed elevated with max A x 1 and transferred to wheelchair with Stedy.  Pt then remained sitting to finish breakfast.  Pt was left sitting up in wheelchahir at nurses station with quick release belt donned.  Therapy Documentation Precautions:  Precautions Precautions: Fall Precaution Comments: monitor O2, pt has LUE DVT (brachial) & currently not candidate for anticoagulation, recent hx of traumatic R SDH Restrictions Weight Bearing Restrictions: No   See Function Navigator for Current Functional Status.   Therapy/Group: Individual Therapy  Candia Kingsbury [name] 02/13/2017, 9:56 AM

## 2017-02-13 NOTE — Consult Note (Signed)
Reason for Consult: HF   Referring Physician: Dr. Naaman Plummer    PCP:  Derrill Center, MD  Primary Cardiologist:Dr. Burt Knack EP Dr. Bethena Midget is an 83 y.o. male.    Chief Complaint: recent edema and SOB   HPI: Alex [name] is a 83 y.o. male who is being seen today for the evaluation of diastolic heart failure at the request of Dr. Naaman Plummer.   Pt has a hx of diastolic heart failure, atrial fibrillation, aortic valve disease, and CAD with CABG in 2012 and AVR with 23 mm Edwards pericardial valve. Also OSA with Cpap.  He has undergone permanent pacemaker placement followed by Dr. Rayann Heman.  He did have a cardiac cath in 2014 with multivessel disease wit mild to moderate nonobstructive RCA stenosis, severe LAD and LCX stenosis and continued patency of LIMA to LAD graft and Seq VG to OM branches.   Last DCCV was 11/20/16 -pt was on sotalol but failed and switched to amiodarone.   In Feb. 2018  Pt had fall and subdural hematoma with craniotomy and coumadin was discontinued and INR reversed and due to no anticoagulation amiodarone was stopped to prevent conversion to SR without anticoagulation.   Last echo 10/2014  EF 55%, indeterminate diastolic function.  PA pk pressure 36, mild LVH.   Bioprosthetic aortic valve was poorly visualized but appeared to function normally.  Recent admit for prostate abscess, HCAP, Bacteremia with Klebsiella pneumonia and DVT of Lt upper extremity.  Again no anticoagulation but is on ASA.      Post recent admit d/c'd to rehab 02/01/17.  Was on lasix 20 mg daily last dose 02/10/17.      Now with lower ext edema BP has been low 95/47 to 100/52 and BB held.  HR controlled.   He is back on amiodarone this appeared to occur with discharge 12/16/16.  I see no cardiology note to resume.    Today pt has some SOB but about his usual.   Wt is down from 235 on the 12 of April to 215 lbs today.  EKG a fib with rate of 77. T wave inversions laterally. I personally  reviewed.   No chest pain.  He was able to stand today.  HR has been controlled reviewing VS.    Past Medical History:  Diagnosis Date  . Aortic stenosis    s/p AVR by Dr Cyndia Bent  . COPD (chronic obstructive pulmonary disease) ([place])   . History of coronary artery disease    status post stenting of the marginal circumflex in 12/2003 and again in 2009  . Hyperlipidemia   . Morbid obesity (HCC)    weight 243 pounds, BMI 31.2kg/m2, BSA 2.36 square meters  . Obstructive sleep apnea    compliant with CPAP  . Persistent atrial fibrillation [facility])     Past Surgical History:  Procedure Laterality Date  . AORTIC VALVE REPLACEMENT (AVR)/CORONARY ARTERY BYPASS GRAFTING (CABG)   08/05/2011   LIMA to LAD, sequential saphenous vein graft to third and fourth obtuse marginal branches of the circumflex, aortic valve replacement using a 23 mm Edwards pericardial valve  . APPENDECTOMY    . CARDIOVERSION N/A 11/14/2014   Procedure: CARDIOVERSION;  Surgeon: Candee Furbish, MD;  Location: [facility] ENDOSCOPY;  Service: Cardiovascular;  Laterality: N/A;  . CARDIOVERSION N/A 11/20/2016   Procedure: CARDIOVERSION;  Surgeon: Fay Records, MD;  Location: [place];  Service: Cardiovascular;  Laterality: N/A;  . CAROTID  ENDARTERECTOMY     Dr Levi Aland  . CRANIOTOMY Right 12/16/2016   Procedure: CRANIOTOMY HEMATOMA EVACUATION SUBDURAL;  Surgeon: Kevan Ny Ditty, MD;  Location: [place];  Service: Neurosurgery;  Laterality: Right;  . EP IMPLANTABLE DEVICE N/A 11/06/2015   Procedure: PPM Generator Changeout;for sick sinus syndrome with a MDT Adapta L PPM, chronically elevated RV threshold.  . permanent pacemaker     MDT EnRhythm implanted by Dr Sherilyn Banker in [place] for complete heart block with syncope  . REPLACEMENT TOTAL KNEE BILATERAL     2006  . TEE WITHOUT CARDIOVERSION N/A 11/14/2014   Procedure: TRANSESOPHAGEAL ECHOCARDIOGRAM (TEE);  Surgeon: Candee Furbish, MD;  Location: [facility] ENDOSCOPY;  Service: Cardiovascular;   Laterality: N/A;  . TRANSURETHRAL INCISION OF PROSTATE N/A 01/29/2017   Procedure: TRANSURETHRAL UNROOFING OF A  PROSTATE ABSCESS;  Surgeon: Kathie Rhodes, MD;  Location: [facility] ORS;  Service: Urology;  Laterality: N/A;    Family History  Problem Relation Age of Onset  . Alzheimer's disease Mother   . Tuberculosis Father   . Other Father     Spinal Meningitis   Social History:  reports that he quit smoking about 13 years ago. His smoking use included Cigarettes. He started smoking about 43 years ago. He has a 15.00 pack-year smoking history. He has never used smokeless tobacco. He reports that he drinks alcohol. He reports that he does not use drugs.  Allergies: No Known Allergies  OUTPATIENT MEDICATIONS:  No current facility-administered medications on file prior to encounter.    Current Outpatient Prescriptions on File Prior to Encounter  Medication Sig Dispense Refill  . acetaminophen (TYLENOL) 500 MG tablet Take 1,000 mg by mouth every 6 (six) hours as needed.    . ALPRAZolam (XANAX) 0.25 MG tablet Take 1 tablet (0.25 mg total) by mouth at bedtime. 30 tablet 0  . amiodarone (PACERONE) 200 MG tablet Take 1 tablet (200 mg total) by mouth 2 (two) times daily. 60 tablet 1  . aspirin 81 MG tablet Take 81 mg by mouth daily.    Marland Kitchen atorvastatin (LIPITOR) 40 MG tablet Take 1 tablet (40 mg total) by mouth daily. 30 tablet 1  . B Complex Vitamins (VITAMIN B COMPLEX PO) Take 1 tablet by mouth daily.     Marland Kitchen escitalopram (LEXAPRO) 10 MG tablet Take 1 tablet (10 mg total) by mouth at bedtime. 30 tablet 1  . furosemide (LASIX) 20 MG tablet Take 1 tablet ('20mg'$ ) by mouth daily. May take extra tablet for weight gain >3lbs 45 tablet 6  . ipratropium-albuterol (DUONEB) 0.5-2.5 (3) MG/3ML SOLN Take 3 mLs by nebulization every 6 (six) hours as needed.    . levETIRAcetam (KEPPRA) 500 MG tablet Take 1 tablet (500 mg total) by mouth 2 (two) times daily. 60 tablet 0  . Magnesium 250 MG TABS Take 1 tablet (250 mg  total) by mouth daily.  0  . metoprolol succinate (TOPROL-XL) 25 MG 24 hr tablet Take 0.5 tablets (12.5 mg total) by mouth daily. 30 tablet 1  . potassium chloride SA (K-DUR,KLOR-CON) 20 MEQ tablet Take 1 tablet (20 mEq total) by mouth daily. 30 tablet 1  . sulfamethoxazole-trimethoprim (BACTRIM DS,SEPTRA DS) 800-160 MG tablet Take 1 tablet by mouth every 12 (twelve) hours.     CURRENT MEDICATIONS: Scheduled Meds: . ALPRAZolam  0.25 mg Oral QHS  . amiodarone  200 mg Oral BID  . aspirin EC  81 mg Oral Daily  . atorvastatin  40 mg Oral q1800  . enoxaparin (  LOVENOX) injection  40 mg Subcutaneous Q24H  . escitalopram  5 mg Oral QHS  . hydrocerin   Topical BID  . levETIRAcetam  500 mg Oral BID  . magnesium oxide  200 mg Oral Daily  . senna-docusate  1 tablet Oral BID  . sulfamethoxazole-trimethoprim  1 tablet Oral Q12H   Continuous Infusions: PRN Meds:.acetaminophen, alum & mag hydroxide-simeth, bisacodyl, diphenhydrAMINE, guaiFENesin-dextromethorphan, ipratropium-albuterol, polyethylene glycol, prochlorperazine **OR** prochlorperazine **OR** prochlorperazine, sodium chloride, traZODone   Results for orders placed or performed during the hospital encounter of 02/01/17 (from the past 48 hour(s))  Prealbumin     Status: Abnormal   Collection Time: 02/13/17  5:48 AM  Result Value Ref Range   Prealbumin 14.0 (L) 18 - 38 mg/dL  Comprehensive metabolic panel     Status: Abnormal   Collection Time: 02/13/17  5:48 AM  Result Value Ref Range   Sodium 139 135 - 145 mmol/L   Potassium 4.3 3.5 - 5.1 mmol/L   Chloride 105 101 - 111 mmol/L   CO2 28 22 - 32 mmol/L   Glucose, Bld 83 65 - 99 mg/dL   BUN 9 6 - 20 mg/dL   Creatinine, Ser 0.96 0.61 - 1.24 mg/dL   Calcium 8.3 (L) 8.9 - 10.3 mg/dL   Total Protein 5.5 (L) 6.5 - 8.1 g/dL   Albumin 1.9 (L) 3.5 - 5.0 g/dL   AST 36 15 - 41 U/L   ALT 27 17 - 63 U/L   Alkaline Phosphatase 92 38 - 126 U/L   Total Bilirubin 0.6 0.3 - 1.2 mg/dL   GFR calc  non Af Amer >60 >60 mL/min   GFR calc Af Amer >60 >60 mL/min    Comment: (NOTE) The eGFR has been calculated using the CKD EPI equation. This calculation has not been validated in all clinical situations. eGFR's persistently <60 mL/min signify possible Chronic Kidney Disease.    Anion gap 6 5 - 15  Ammonia     Status: Abnormal   Collection Time: 02/13/17  5:48 AM  Result Value Ref Range   Ammonia 61 (H) 9 - 35 umol/L  CBC with Differential/Platelet     Status: Abnormal   Collection Time: 02/13/17  5:48 AM  Result Value Ref Range   WBC 7.3 4.0 - 10.5 K/uL   RBC 3.21 (L) 4.22 - 5.81 MIL/uL   Hemoglobin 9.6 (L) 13.0 - 17.0 g/dL   HCT 31.3 (L) 39.0 - 52.0 %   MCV 97.5 78.0 - 100.0 fL   MCH 29.9 26.0 - 34.0 pg   MCHC 30.7 30.0 - 36.0 g/dL   RDW 17.5 (H) 11.5 - 15.5 %   Platelets 219 150 - 400 K/uL   Neutrophils Relative % 54 %   Neutro Abs 4.0 1.7 - 7.7 K/uL   Lymphocytes Relative 32 %   Lymphs Abs 2.4 0.7 - 4.0 K/uL   Monocytes Relative 10 %   Monocytes Absolute 0.7 0.1 - 1.0 K/uL   Eosinophils Relative 3 %   Eosinophils Absolute 0.2 0.0 - 0.7 K/uL   Basophils Relative 1 %   Basophils Absolute 0.0 0.0 - 0.1 K/uL   No results found.  ROS: General:no colds or fevers, + weight changes Skin:no rashes + on heels may have ulcers- wrapped HEENT:no blurred vision, no congestion CV:see HPI PUL:see HPI GI:no diarrhea constipation or melena, no indigestion GU:no hematuria, no dysuria MS:no joint pain, no claudication Neuro:no syncope, no lightheadedness Endo:no diabetes, no thyroid disease   Blood pressure Marland Kitchen)  100/52, pulse 78, temperature 98.2 F (36.8 C), temperature source Oral, resp. rate 16, weight 215 lb 13.3 oz (97.9 kg), SpO2 96 %.  Wt Readings from Last 3 Encounters:  02/13/17 215 lb 13.3 oz (97.9 kg)  02/01/17 233 lb 11 oz (106 kg)  01/10/17 202 lb 6.1 oz (91.8 kg)    PE: General:Pleasant affect, NAD, sitting up in chair Skin:Warm and dry, brisk capillary  refill HEENT:normocephalic, sclera clear, mucus membranes moist Neck:supple, no JVD, no bruits  Heart:irreg irreg without murmur, gallup, rub or click Lungs:clear without rales, rhonchi, or wheezes XQK:SKSH, non tender, + BS, do not palpate liver spleen or masses Ext:tr lower ext edema, 2+ pedal pulses, 2+ radial pulses Neuro:alert and oriented X 3, MAE, follows commands, + facial symmetry    Assessment/Plan 1. Volume overload with hx of diastolic HF, now wt is down and mild SOB - lasix is on hold.  BP is low so lasix 20 mg MWF may be best.  Dr. Sallyanne Kuster has seen.  2. A fib/flutter  Rate is controlled.  Amiodarone had been stopped to prevent conversion to SR off anticoagulation but at this point will decrease dose to 200 mg daily.  Will use for rate control.  Pt previously had been on toprol for rate control but with low BP it has been held.  CHA2DS2VASc  of at least 4.  But no anticoagulation due to recent subdural hematoma.  He is on 81 mg asa for DVT of upper ext.  3. AVR tissue valve stable.  4.  CAD with hx CABG  No chest pain.   5.  Hx subdural hematoma  With craniotomy.    Cecilie Kicks  Nurse Practitioner Certified [place] Pager [phone] or after 5pm or weekends call [phone] 02/13/2017, 10:13 AM    I have seen and examined the patient along with Cecilie Kicks  NP.  I have reviewed the chart, notes and new data.  I agree with NP's note.  Key new complaints: mild dyspnea, activity limited mostly by generalized weakness and instability Key examination changes: irregular rhythm, faint early peaking systolic ejection murmur, otherwise normal exam. Almost no pedal edema. Mild asymmetrical swelling of left upper extremity. Key new findings / data: ECG shows atrial fibrillation (fairly organized, almost flutter like) with good rate control  PLAN:  Due to hypotension, we are compelled to use amiodarone for rate control, but I think the dose should be  reduced to 200 mg daily. Monitor LFTs and TSH carefully. Look for opportunities to switch to a less risky medication such as a beta blocker, if his BP improves. He has minimal (if any) signs of hypervolemia. To help with hypotension, change diuretic dosing to every other day. Thsi will require further "fine-tuning" as he is transferred to SNF or home and his diet changes.  Sanda Klein, MD, [place] [phone] 02/13/2017, 12:36 PM

## 2017-02-13 NOTE — Progress Notes (Signed)
Occupational Therapy Session Note  Patient Details  Name: Alex [name] MRN: [id number] Date of Birth: 06-22-1931  Today's Date: 02/13/2017 OT Individual Time: 0900-1000 OT Individual Time Calculation (min): 60 min    Short Term Goals: Week 2:  OT Short Term Goal 1 (Week 2): Pt will perform LB dressing with max A in order to decrease level of assist with functional task.  OT Short Term Goal 2 (Week 2): Pt will demonstrate min A dynamic sitting balance for 5 minutes during functional task.  OT Short Term Goal 3 (Week 2): Pt will perform UB dressing with min A in order to increase I with self care. OT Short Term Goal 4 (Week 2): Pt will perform toilet transfer with max A of 1 in order to reduce level of assitance with functional transfers.  Skilled Therapeutic Interventions/Progress Updates:    Pt resting in TIS w/c upon arrival.  Pt engage in BADL retraining including bathing/dressing with sit<>stand from w/c at sink.  Pt required mod A for standing in STEADY X 2, min A X 1, and supervision X 1.  Pt able to stand for approx 30 seconds X 3 with BUE support during LB bathing/dressing tasks.  Pt continues to exhibit diminished functional use of his LUE, although improvement is noted.  Pt requires more than a reasonable amount of time to complete tasks with multiple extended rest breaks.  Pt's wife present and encourage by pt's progress. Pt continues to required mod/max A for BADLs with use of mechanical lift (STEADY) for standing and functional transfers.   Therapy Documentation Precautions:  Precautions Precautions: Fall Precaution Comments: monitor O2, pt has LUE DVT (brachial) & currently not candidate for anticoagulation, recent hx of traumatic R SDH Restrictions Weight Bearing Restrictions: No  Pain: Pain Assessment Pain Assessment: No/denies pain  See Function Navigator for Current Functional Status.   Therapy/Group: Individual Therapy  [name] 02/13/2017, 10:04  AM

## 2017-02-13 NOTE — Progress Notes (Signed)
Physical Therapy Session Note  Patient Details  Name: Alex [name] MRN: [id number] Date of Birth: 09/14/1931  Today's Date: 02/13/2017 PT Individual Time: 1300-1353 PT Individual Time Calculation (min): 53 min    Skilled Therapeutic Interventions/Progress Updates:    Pt denies pain upon therapist entering the room.  Treatment focused on improving endurance and functional mobility.  Pt performed cone transfers seated in wheelchair to work on improving mobility and technique for sit to stand.  Additionally performed isometric partial sit to stand transfers.  Pt then performed wheelchair to bed with Stedy with mod A x 2.  Then performed 3 sit to stand with mod A x 1 in stedy prior to pt fatigued.  Pt then performed sit to supine and then supine to sit.  Upon last supine to sit, on 1 L 02, pt Sp02 was 84%.  Therapist turned up oxygen to 2L and pt oxygen level returned to high 90's.   Pt returned to bed and returned to 1 L02 with Sp02 in high 90's.  Nursing notified of pt response to treatment.  Pt was on 1L02 t/o session today and other than one episode, Sp02 remained > 92%.  Following treatment, pt left in bed with call bell in reach and alarm set.  Therapy Documentation Precautions:  Precautions Precautions: Fall Precaution Comments: monitor O2, pt has LUE DVT (brachial) & currently not candidate for anticoagulation, recent hx of traumatic R SDH Restrictions Weight Bearing Restrictions: No     See Function Navigator for Current Functional Status.   Therapy/Group: Individual Therapy  [name] [name] 02/13/2017, 3:26 PM

## 2017-02-13 NOTE — Progress Notes (Addendum)
[facility] PHYSICAL MEDICINE & REHABILITATION     PROGRESS NOTE    Subjective/Complaints:  up at eob eating. OT at bedside  ROS: pt denies nausea, vomiting, diarrhea, cough, shortness of breath or chest pain       Objective: Vital Signs: Blood pressure (!) 100/52, pulse 78, temperature 98.2 F (36.8 C), temperature source Oral, resp. rate 16, weight 97.9 kg (215 lb 13.3 oz), SpO2 96 %. No results found.  Recent Labs  02/13/17 0548  WBC 7.3  HGB 9.6*  HCT 31.3*  PLT 219    Recent Labs  02/13/17 0548  NA 139  K 4.3  CL 105  GLUCOSE 83  BUN 9  CREATININE 0.96  CALCIUM 8.3*   CBG (last 3)   Recent Labs  02/10/17 2110 02/11/17 0656  GLUCAP 114* 84    Wt Readings from Last 3 Encounters:  02/13/17 97.9 kg (215 lb 13.3 oz)  02/01/17 106 kg (233 lb 11 oz)  01/10/17 91.8 kg (202 lb 6.1 oz)    Physical Exam:  Constitutional: no distress.  Obese  HENT:  Head: Normocephalic. External ears normal. Eyes: Conjunctivae and EOM are normal. Right eye exhibits no discharge. Left eye exhibits no discharge.  Neck: Normal range of motion. Neck supple.  Cardiovascular:  IRR/IRR Respiratory: clear.  GI: Soft. Bowel sounds are normal.  Musculoskeletal: He exhibits tr to 1+ edema LUE Neurological: more alert, . Oriented to month/year/place Motor: 4-/5 UE. 3/5 HF,KE 4/5 distal.   Skin: Skin is warm and dry.  Psychiatric: He has a normal mood and affect. His behavior is normal.   Assessment/Plan: 1. Functional deficits secondary to debility after sepsis/multiple medical which require 3+ hours per day of interdisciplinary therapy in a comprehensive inpatient rehab setting. Physiatrist is providing close team supervision and 24 hour management of active medical problems listed below. Physiatrist and rehab team continue to assess barriers to discharge/monitor patient progress toward functional and medical goals.  Function:  Bathing Bathing position   Position: Other  (comment) (LB in bed and UB at sink)  Bathing parts Body parts bathed by patient: Right arm, Chest, Abdomen, Left arm, Front perineal area Body parts bathed by helper: Buttocks, Right lower leg, Left lower leg, Back, Right upper leg, Left upper leg  Bathing assist Assist Level:  (max A)      Upper Body Dressing/Undressing Upper body dressing   What is the patient wearing?: Pull over shirt/dress     Pull over shirt/dress - Perfomed by patient: Thread/unthread right sleeve, Put head through opening Pull over shirt/dress - Perfomed by helper: Thread/unthread left sleeve, Pull shirt over trunk        Upper body assist Assist Level: More than reasonable time   Set up : To obtain clothing/put away  Lower Body Dressing/Undressing Lower body dressing   What is the patient wearing?: Pants, Non-skid slipper socks, Ted Hose, Underwear   Underwear - Performed by helper: Thread/unthread right underwear leg, Thread/unthread left underwear leg, Pull underwear up/down Pants- Performed by patient: Pull pants up/down Pants- Performed by helper: Thread/unthread right pants leg, Thread/unthread left pants leg, Pull pants up/down   Non-skid slipper socks- Performed by helper: Don/doff right sock, Don/doff left sock               TED Hose - Performed by helper: Don/doff right TED hose, Don/doff left TED hose  Lower body assist Assist for lower body dressing:  (total A)      Toileting Toileting Toileting activity  did not occur: No continent bowel/bladder event Toileting steps completed by patient: Adjust clothing prior to toileting Toileting steps completed by helper: Performs perineal hygiene, Adjust clothing after toileting Toileting Assistive Devices: Grab bar or rail  Toileting assist Assist level: Touching or steadying assistance (Pt.75%)   Transfers Chair/bed transfer   Chair/bed transfer method: Lateral scoot Chair/bed transfer assist level: 2 helpers Chair/bed transfer assistive  device: Sliding board Mechanical lift: Maximove   Locomotion Ambulation Ambulation activity did not occur: Safety/medical concerns         Wheelchair   Type: Manual Max wheelchair distance: 30 ft (BLE) Assist Level: Dependent (Pt equals 0%)  Cognition Comprehension Comprehension assist level: Follows complex conversation/direction with extra time/assistive device  Expression Expression assist level: Expresses complex ideas: With no assist  Social Interaction Social Interaction assist level: Interacts appropriately with others - No medications needed.  Problem Solving Problem solving assist level: Solves complex problems: With extra time  Memory Memory assist level: More than reasonable amount of time   Medical Problem List and Plan: 1.  Debilitation secondary to sepsis as well as history of bilateral SDH status post craniotomy 12/16/2016  -pt continues to struggle to progress  -stamina poor, cognition an issue  -appreciate Palliative care assistance  -he remains DNR  2.  DVT Prophylaxis/Anticoagulation: see below, SCD's 3. Pain Management: have stopped oxycodone due to confusion  - tylenol prn 4. Mood: Xanax 0.25 mg daily at bedtime, Lexapro 10 mg daily at bedtime---decreased to   -denies depression     -neuropsych following along   -continue to provide emotional support as possible 5. Neuropsych/mental status: This patient is capable of making decisions on his own behalf.  -holding all potentially neurosedating meds (except xanax)  -urine cx with 30k staph haemolyticus. Not sensitive to any reasonable abx options---will not treat  -ammonia level high, 61   -give 30g lactulose today. Recheck ammonia tomorrow   -he has a history of ETOH abuse and LFT's were elevated earlier in the year   -CT of liver from 01/27/17 was normal   -I spoke with GI who agreed with lactulose trial and felt that this may be a result of his chronic illness and that his liver may not be functioning  well enough to process ammonia efficiently. Will continue for a few days and observe for effect.  6. Skin/Wound Care: Routine skin checks 7. Fluids/Electrolytes/Nutrition: encourage PO.  -albumin still low, prealbumin pending  -I personally reviewed the patient's labs today.   8. Seizure prophylaxis. Keppra 500 mg twice a day 9. ID/bacteremia. Septra since 4/8 10. COPD/OSA. Check oxygen saturations every shift 11. Atrial fibrillation/AVR/CABG/PPM. Continue low-dose aspirin as well as amiodarone 200 mg twice a day. Chronic Coumadin discontinued due to subdural hematomas 12. Diastolic congestive heart failure. Lasix 20 mg daily.--held due to low bp's  -daily weights---weight at 98kg today   -will ask cardiology to weigh in on poor exercise tolerance, persistently low bp's despite being off meds.  13. Hypertension. Currently with hypotension. Toprol-XL 12.5 mg daily on hold. Holding lasix too 14. Hyperlipidemia. Lipitor 15. Prostatic abscess: S/p TURP. Cont bactrim through 4/28 16. ABLA: Monitor CBC, hgb 9.6 today. No signs of blood loss---continue to follow serially 17. LUE DVT: Brachial vein. Not an anticoagulation candidate due to recent subdural hematoma and craniotomy  -continue to elevate/conservative care--  -edema slowly improving   LOS (Days) 12 A FACE TO FACE EVALUATION WAS PERFORMED  [name], MD 02/13/2017 9:01 AM

## 2017-02-13 NOTE — Progress Notes (Signed)
Physical Therapy Session Note  Patient Details  Name: Alex [name] MRN: [id number] Date of Birth: May 05, 1931  Today's Date: 02/13/2017 PT Individual Time: 1100-1130 PT Individual Time Calculation (min): 30 min   Short Term Goals: Week 2:  PT Short Term Goal 1 (Week 2): Pt will perform all bed mobility tasks with mod assist +1. PT Short Term Goal 2 (Week 2): Pt will transfer sit<>stand with max assist +1. PT Short Term Goal 3 (Week 2): Pt will transfer bed<>w/c with max assist +1.  Skilled Therapeutic Interventions/Progress Updates:    Pt sitting in TIS w/c upon arrival, agreeable to PT session. Transported via w/c to gym. Slideboard transfer performed w/c<>mat table with +2 assist. Assist with weight shifting and board placement. Verbal and manual cues for sequence of transfer. Once sitting on table, working on trunk stability/balance. Initially sitting with single UE support with progression to no UE support. Ball toss at variable angles and heights performed with PT assisting balance as needed. Seated ball kicks also performed with continued assist as needed. After session, pt returned to room via TIS w/c, up in room with spouse present and all needs in reach. No appreciated Lt UE weeping during session.   Therapy Documentation Precautions:  Precautions Precautions: Fall Precaution Comments: monitor O2, pt has LUE DVT (brachial) & currently not candidate for anticoagulation, recent hx of traumatic R SDH Restrictions Weight Bearing Restrictions: No  Pain: Pain Assessment Pain Assessment: No/denies pain  See Function Navigator for Current Functional Status.   Therapy/Group: Individual Therapy  Delton See, PT 02/13/2017, 12:45 PM

## 2017-02-14 ENCOUNTER — Inpatient Hospital Stay (HOSPITAL_COMMUNITY): Payer: Medicare Other | Admitting: Occupational Therapy

## 2017-02-14 ENCOUNTER — Inpatient Hospital Stay (HOSPITAL_COMMUNITY): Payer: Medicare Other

## 2017-02-14 DIAGNOSIS — E785 Hyperlipidemia, unspecified: Secondary | ICD-10-CM

## 2017-02-14 DIAGNOSIS — I5032 Chronic diastolic (congestive) heart failure: Secondary | ICD-10-CM

## 2017-02-14 DIAGNOSIS — J449 Chronic obstructive pulmonary disease, unspecified: Secondary | ICD-10-CM

## 2017-02-14 DIAGNOSIS — I4891 Unspecified atrial fibrillation: Secondary | ICD-10-CM

## 2017-02-14 LAB — AMMONIA: AMMONIA: 152 umol/L — AB (ref 9–35)

## 2017-02-14 NOTE — Progress Notes (Signed)
Patient ID: Alex [name], male   DOB: 10-Feb-1931, 83 y.o.   MRN: [id number]   02/14/17.  Alex [name] is a 83 y.o. male  admit for CIR with Debilitation secondary to sepsis as well as history of bilateral SDH status post craniotomy 12/16/2016   Past Medical History:  Diagnosis Date  . Aortic stenosis    s/p AVR by Dr Laneta Simmers  . COPD (chronic obstructive pulmonary disease) (HCC)   . History of coronary artery disease    status post stenting of the marginal circumflex in 12/2003 and again in 2009  . Hyperlipidemia   . Morbid obesity (HCC)    weight 243 pounds, BMI 31.2kg/m2, BSA 2.36 square meters  . Obstructive sleep apnea    compliant with CPAP  . Persistent atrial fibrillation (HCC)     Subjective: No new complaints. No new problems.  Remains weak; sitting on side of bed, feeding himself breakfast  Objective: Vital signs in last 24 hours: Temp:  [97.3 F (36.3 C)-97.4 F (36.3 C)] 97.4 F (36.3 C) (04/21 0501) Pulse Rate:  [54-79] 79 (04/21 0501) Resp:  [17-18] 18 (04/21 0501) BP: (89-103)/(47-56) 103/56 (04/21 0501) SpO2:  [95 %-96 %] 96 % (04/21 0501) Weight:  [215 lb 6.2 oz (97.7 kg)] 215 lb 6.2 oz (97.7 kg) (04/21 0501) Weight change: -7.1 oz (-0.2 kg) Last BM Date: 02/12/17  Intake/Output from previous day: 04/20 0701 - 04/21 0700 In: 600 [P.O.:600] Out: 1025 [Urine:1025] Last cbgs: CBG (last 3)  No results for input(s): GLUCAP in the last 72 hours.  BP Readings from Last 3 Encounters:  02/14/17 (!) 103/56  02/01/17 (!) 93/51  01/10/17 100/61    Physical Exam General: No apparent distress   HEENT: not dry Lungs: Normal effort. Lungs clear to auscultation, no crackles or wheezes. Cardiovascular:  heart rate approximately 100 Abdomen: S/NT/ND; BS(+) Musculoskeletal:  unchanged Neurological: No new neurological deficits; general weakness Wounds: N/A    Skin: clear  Aging changes Mental state: Alert, oriented, cooperative    Lab Results: BMET     Component Value Date/Time   NA 139 02/13/2017 0548   NA 142 11/14/2016 1601   K 4.3 02/13/2017 0548   CL 105 02/13/2017 0548   CO2 28 02/13/2017 0548   GLUCOSE 83 02/13/2017 0548   BUN 9 02/13/2017 0548   BUN 23 11/14/2016 1601   CREATININE 0.96 02/13/2017 0548   CREATININE 0.92 11/01/2015 1104   CALCIUM 8.3 (L) 02/13/2017 0548   GFRNONAA >60 02/13/2017 0548   GFRAA >60 02/13/2017 0548   CBC    Component Value Date/Time   WBC 7.3 02/13/2017 0548   RBC 3.21 (L) 02/13/2017 0548   HGB 9.6 (L) 02/13/2017 0548   HCT 31.3 (L) 02/13/2017 0548   HCT 37.6 11/14/2016 1601   PLT 219 02/13/2017 0548   PLT 181 11/14/2016 1601   MCV 97.5 02/13/2017 0548   MCV 102 (H) 11/14/2016 1601   MCH 29.9 02/13/2017 0548   MCHC 30.7 02/13/2017 0548   RDW 17.5 (H) 02/13/2017 0548   RDW 13.9 11/14/2016 1601   LYMPHSABS 2.4 02/13/2017 0548   LYMPHSABS 1.7 11/14/2016 1601   MONOABS 0.7 02/13/2017 0548   EOSABS 0.2 02/13/2017 0548   EOSABS 0.1 11/14/2016 1601   BASOSABS 0.0 02/13/2017 0548   BASOSABS 0.1 11/14/2016 1601    Medications: I have reviewed the patient's current medications.  Assessment/Plan:  General debility and functional deficits following sepsis syndrome  Atrial fibrillation/status post aVR/status post CABG/status  post PPM COPD/OSA Diastolic heart failure.  Appears compensated.  Blood pressure remains low normal off medications Dyslipidemia.  Continue statin therapy   Length of stay, days: 13  Alex [name] , MD 02/14/2017, 9:21 AM

## 2017-02-14 NOTE — Progress Notes (Signed)
Occupational Therapy Session Note  Patient Details  Name: Alex [name] MRN: [id number] Date of Birth: 04-Oct-1931  Today's Date: 02/14/2017 OT Individual Time: 7829-5621 OT Individual Time Calculation (min): 50 min    Short Term Goals: Week 2:  OT Short Term Goal 1 (Week 2): Pt will perform LB dressing with max A in order to decrease level of assist with functional task.  OT Short Term Goal 2 (Week 2): Pt will demonstrate min A dynamic sitting balance for 5 minutes during functional task.  OT Short Term Goal 3 (Week 2): Pt will perform UB dressing with min A in order to increase I with self care. OT Short Term Goal 4 (Week 2): Pt will perform toilet transfer with max A of 1 in order to reduce level of assitance with functional transfers.  Skilled Therapeutic Interventions/Progress Updates: Patient participation in OT today as follows:   Patient supine in bed and tolerated UE endurance activities then sat edge of bed with fair- static sitting balance;   Ptient was able to maintain balance while seated EOB to use bilateral hands to work on coordination (fine motor) to open containers,      Patient required 3 tries of sitting EOB with bed elevated for sit to stand into stedy for transfer into his w/c.   EOB to w/c transfer via Stedy=Max A x2;  He required setup of tray before self feeding  He was left seated in hs wc with safety postioning belt in place with his wife at the en dof the sessoin     Therapy Documentation Precautions:  Precautions Precautions: Fall Precaution Comments: monitor O2, pt has LUE DVT (brachial) & currently not candidate for anticoagulation, recent hx of traumatic R SDH Restrictions Weight Bearing Restrictions: No    Vital Signs:  O2=94+ room air;  BP=94/56 after 15 minutes therapy  Pain:denied     See Function Navigator for Current Functional Status.   Therapy/Group: Individual Therapy  Bud Face [facility] 02/14/2017, 4:44 PM

## 2017-02-14 NOTE — Progress Notes (Signed)
Occupational Therapy Session Note  Patient Details  Name: Alex [name] MRN: [id number] Date of Birth: 1931/08/16  Today's Date: 02/14/2017 OT Individual Time:800-900 Treatment time: 60 minutes     Short Term Goals: Week 2:  OT Short Term Goal 1 (Week 2): Pt will perform LB dressing with max A in order to decrease level of assist with functional task.  OT Short Term Goal 2 (Week 2): Pt will demonstrate min A dynamic sitting balance for 5 minutes during functional task.  OT Short Term Goal 3 (Week 2): Pt will perform UB dressing with min A in order to increase I with self care. OT Short Term Goal 4 (Week 2): Pt will perform toilet transfer with max A of 1 in order to reduce level of assitance with functional transfers.  Skilled Therapeutic Interventions/Progress Updates: ADL-retraining with focus on improved transfers, adapted bathing/dressing skills, sit<>stand, cognitive remediation (memory), and activity tolerance.   Pt received sitting at edge of bed, leaning to his right, between [facility] (elevated) and hand rails, unable to perform postural readjustment to enable self-feeding.  OT advised transfer to w/c to complete self-feeding and to progress to B&D.   After setup and mod assist to assume upright sitting at EOB, pt required max assist for lateral scoot to w/c and setup to finish self-feeding. Performance improved with presentation of meal, one item at a time, as pt is unable to manage complex food tray arrangement.    Pt was then positioned at sink in w/c and progressed through upper body bathing/dressing with overall min assist and max vc to sequence.   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   Pt was unable to rise to standing, even with use of Stedy, to wash periarea and buttocks and therefore required total assist to transfer back to bed for assisted lower body bathing and dressing, although  performing bed rolls with only min assist (tactile and verbal cues to push away from rails).   Pt left in bed at end of session with call light and phone within reach, bed alarm activated.     Therapy Documentation Precautions:  Precautions Precautions: Fall Precaution Comments: monitor O2, pt has LUE DVT (brachial) & currently not candidate for anticoagulation, recent hx of traumatic R SDH Restrictions Weight Bearing Restrictions: No   Vital Signs: Therapy Vitals Temp: 97.4 F (36.3 C) Temp Source: Oral Pulse Rate: 79 Resp: 18 BP: (!) 103/56 Patient Position (if appropriate): Lying Oxygen Therapy SpO2: 96 % O2 Device: Not Delivered  Pain: No/denies pain    See Function Navigator for Current Functional Status.   Therapy/Group: Individual Therapy  Kross Swallows 02/14/2017, 7:04 AM

## 2017-02-14 NOTE — Progress Notes (Signed)
Occupational Therapy Session Note  Patient Details  Name: Alex [name] MRN: [id number] Date of Birth: 1931-08-31    Skilled Therapeutic Interventions/Progress Updates: Patient participated as follows:   Pre w/c push ups x4 to increase endurance and arm and leg strength;    stedy to bed transfer after 2 attempts =Total A x 2 (patient c/o extreme fatigue); Patient was positioned on his left side and required min a and cues for bed mobility  He was left in bed with alarm engaged and his wife sitting beside the bed.     Therapy Documentation Precautions:  Precautions Precautions: Fall Precaution Comments: monitor O2, pt has LUE DVT (brachial) & currently not candidate for anticoagulation, recent hx of traumatic R SDH Restrictions Weight Bearing Restrictions: No  Pain:denied   Therapy/Group: Individual Therapy  Bud Face [facility] 02/14/2017, 4:56 PM

## 2017-02-15 ENCOUNTER — Inpatient Hospital Stay (HOSPITAL_COMMUNITY): Payer: Medicare Other

## 2017-02-15 ENCOUNTER — Inpatient Hospital Stay (HOSPITAL_COMMUNITY): Payer: Medicare Other | Admitting: Occupational Therapy

## 2017-02-15 NOTE — Plan of Care (Signed)
Problem: RH BLADDER ELIMINATION Goal: RH STG MANAGE BLADDER WITH ASSISTANCE STG Manage Bladder With Assistance. Mod  Outcome: Not Progressing Condom cath in place due to incontinence

## 2017-02-15 NOTE — Progress Notes (Signed)
Occupational Therapy Note  Patient Details  Name: Alex [name] MRN: [id number] Date of Birth: 11/24/1930  Today's Date: 02/15/2017 OT Individual Time: 1400-1430 OT Individual Time Calculation (min): 30 min   Pt denied pain Individual Therapy  Pt resting in bed upon arrival but agreeable to participating in therapy.  Focus on bed mobility, sit<>stand with Stedy, and standing endurance in [place].  Pt sat EOB with supervision and HOB elevated.  Pt able to position self properly at EOB in preparation for sit<>stand from EOB with Stedy.  Pt required max A for initial sit<>stand and mod A to remain standing with BUE support for approx 10 seconds.  Pt performed sit<>stand X 3 with mod A/min A with rest on seat of Stedy.  Pt returned to TIS w/c and remained seated reclined and QRB in place.  All needs within reach and RN notified.    Lavone Neri [facility] 02/15/2017, 2:46 PM

## 2017-02-15 NOTE — Progress Notes (Signed)
Patient ID: Alex [name], male   DOB: August 09, 1931, 83 y.o.   MRN: [id number]   02/15/17.  Alex [name] is a 83 y.o. male with a history of bilateral SDH, who is status post craniotomy on 12/16/2016.  He is admitted for CIR with debilitation secondary to sepsis and bilateral SDH.  Subjective: No new complaints. No new problems.  Comfortable night.  Good rehabilitation sessions yesterday  Past Medical History:  Diagnosis Date  . Aortic stenosis    s/p AVR by Dr Laneta Simmers  . COPD (chronic obstructive pulmonary disease) (HCC)   . History of coronary artery disease    status post stenting of the marginal circumflex in 12/2003 and again in 2009  . Hyperlipidemia   . Morbid obesity (HCC)    weight 243 pounds, BMI 31.2kg/m2, BSA 2.36 square meters  . Obstructive sleep apnea    compliant with CPAP  . Persistent atrial fibrillation (HCC)      Objective: Vital signs in last 24 hours: Temp:  [97.4 F (36.3 C)-97.8 F (36.6 C)] 97.4 F (36.3 C) (04/22 0447) Pulse Rate:  [77-94] 77 (04/22 0447) Resp:  [18] 18 (04/22 0447) BP: (93-105)/(44) 105/44 (04/22 0447) SpO2:  [97 %] 97 % (04/22 0447) Weight:  [214 lb 15.2 oz (97.5 kg)] 214 lb 15.2 oz (97.5 kg) (04/22 0447) Weight change: -7.1 oz (-0.2 kg) Last BM Date: 02/14/17  Intake/Output from previous day: 04/21 0701 - 04/22 0700 In: -  Out: 750 [Urine:750]  BP Readings from Last 3 Encounters:  02/15/17 (!) 105/44  02/01/17 (!) 93/51  01/10/17 100/61    Physical Exam General: No apparent distress   HEENT: not dry Lungs: Normal effort. Lungs clear to auscultation, no crackles or wheezes. Cardiovascular: Regular rate and rhythm, no edema Abdomen: S/NT/ND; BS(+) Musculoskeletal:  unchanged Neurological: No new neurological deficits; remains generally weak  Wounds: N/A    Skin: clear   Mental state: Alert, oriented, cooperative    Lab Results: BMET    Component Value Date/Time   NA 139 02/13/2017 0548   NA 142 11/14/2016 1601    K 4.3 02/13/2017 0548   CL 105 02/13/2017 0548   CO2 28 02/13/2017 0548   GLUCOSE 83 02/13/2017 0548   BUN 9 02/13/2017 0548   BUN 23 11/14/2016 1601   CREATININE 0.96 02/13/2017 0548   CREATININE 0.92 11/01/2015 1104   CALCIUM 8.3 (L) 02/13/2017 0548   GFRNONAA >60 02/13/2017 0548   GFRAA >60 02/13/2017 0548   CBC    Component Value Date/Time   WBC 7.3 02/13/2017 0548   RBC 3.21 (L) 02/13/2017 0548   HGB 9.6 (L) 02/13/2017 0548   HCT 31.3 (L) 02/13/2017 0548   HCT 37.6 11/14/2016 1601   PLT 219 02/13/2017 0548   PLT 181 11/14/2016 1601   MCV 97.5 02/13/2017 0548   MCV 102 (H) 11/14/2016 1601   MCH 29.9 02/13/2017 0548   MCHC 30.7 02/13/2017 0548   RDW 17.5 (H) 02/13/2017 0548   RDW 13.9 11/14/2016 1601   LYMPHSABS 2.4 02/13/2017 0548   LYMPHSABS 1.7 11/14/2016 1601   MONOABS 0.7 02/13/2017 0548   EOSABS 0.2 02/13/2017 0548   EOSABS 0.1 11/14/2016 1601   BASOSABS 0.0 02/13/2017 0548   BASOSABS 0.1 11/14/2016 1601    Medications: I have reviewed the patient's current medications.  Assessment/Plan:  Deconditioning/general debility following sepsis syndrome Status post aVR/status post CABG/status post PPM/atrial fibrillation Diastolic heart failure, compensated.  Remains off medication due to low normal blood  pressure readings Dyslipidemia.  Continue statin therapy    Length of stay, days: 14  Rogelia Boga , MD 02/15/2017, 8:33 AM

## 2017-02-15 NOTE — Progress Notes (Signed)
Occupational Therapy Session Note  Patient Details  Name: Alex [name] MRN: [id number] Date of Birth: 1931/01/08  Today's Date: 02/15/2017 OT Individual Time: 0700-0756 OT Individual Time Calculation (min): 56 min    Short Term Goals: Week 2:  OT Short Term Goal 1 (Week 2): Pt will perform LB dressing with max A in order to decrease level of assist with functional task.  OT Short Term Goal 2 (Week 2): Pt will demonstrate min A dynamic sitting balance for 5 minutes during functional task.  OT Short Term Goal 3 (Week 2): Pt will perform UB dressing with min A in order to increase I with self care. OT Short Term Goal 4 (Week 2): Pt will perform toilet transfer with max A of 1 in order to reduce level of assitance with functional transfers.  Skilled Therapeutic Interventions/Progress Updates:    Pt resting in bed upon arrival and agreeable to engaging in BADL retraining including LB bathing/dressing at bed level and UB bathing/dressing at w/c level.  Pt able to roll side to side with use of bed rails at supervision level.  Pt's sacral dressing soiled and RN changed dressing.  Pt sat EOB with min A and occasional A for sitting balance to complete LB dressing tasks.  Pt required max A for sit<>stand with Steady to pull up pants. Pt completed UB bathing/dressing tasks seated in w/c at sink.  Pt required assistance to pull shirt over his trunk.  Pt continues to exhibit decreased coordination with gross motor and FM tasks.  Pt O2 sats>90% on RA throughout session.  Focus on bed mobility, sitting balance, sit<>stand, standing balance, task initiation, sequencing, and safety awareness to increase independence with BADLs.  Therapy Documentation Precautions:  Precautions Precautions: Fall Precaution Comments: monitor O2, pt has LUE DVT (brachial) & currently not candidate for anticoagulation, recent hx of traumatic R SDH Restrictions Weight Bearing Restrictions: No Pain:  Pt denied pain See Function  Navigator for Current Functional Status.   Therapy/Group: Individual Therapy  [name] 02/15/2017, 7:56 AM

## 2017-02-15 NOTE — Progress Notes (Signed)
Occupational Therapy Session Note  Patient Details  Name: Alex [name] MRN: [id number] Date of Birth: 1931/10/14  Today's Date: 02/15/2017 OT Individual Time: 4098-1191 OT Individual Time Calculation (min): 44 min    Short Term Goals: Week 1:  OT Short Term Goal 1 (Week 1): Pt will perform stand pivot transfer with mod A +1 to BSC/ toilet OT Short Term Goal 1 - Progress (Week 1): Not met OT Short Term Goal 2 (Week 1): Pt will perform sit to stand for clothing management with  mod A  OT Short Term Goal 2 - Progress (Week 1): Not met OT Short Term Goal 3 - Progress (Week 1): Met OT Short Term Goal 4 (Week 1): Pt will be oriented x4 with mod external cues OT Short Term Goal 4 - Progress (Week 1): Progressing toward goal Week 2:  OT Short Term Goal 1 (Week 2): Pt will perform LB dressing with max A in order to decrease level of assist with functional task.  OT Short Term Goal 2 (Week 2): Pt will demonstrate min A dynamic sitting balance for 5 minutes during functional task.  OT Short Term Goal 3 (Week 2): Pt will perform UB dressing with min A in order to increase I with self care. OT Short Term Goal 4 (Week 2): Pt will perform toilet transfer with max A of 1 in order to reduce level of assitance with functional transfers.  Skilled Therapeutic Interventions/Progress Updates: Pt was sitting in w/c at time of arrival, reporting desire to shave. Pt able to complete task with close supervision. Responded well to "soft strokes" cueing. Pt leaving water running and shaving cream on hands and reporting "okay, I'm done." Cues for termination/cleanup of task. He then completed oral care with supervision, unable to independently locate toothpaste when near other items in front of him. Afterwards pt said "uh oh" and OT noticed that feces was accumulating in puddle on floor, leaking from brief and down leg. 2 helpers for sit<stand in [place] to return pt to bed for cleanup. Min A for rolling R<L while RN and  additional helper completed LB hygiene and bed change. Pt left with RN at time of departure.      Therapy Documentation Precautions:  Precautions Precautions: Fall Precaution Comments: monitor O2, pt has LUE DVT (brachial) & currently not candidate for anticoagulation, recent hx of traumatic R SDH Restrictions Weight Bearing Restrictions: No  Pain: No c/o pain during tx    ADL: ADL ADL Comments: See functional assessment tool  :    See Function Navigator for Current Functional Status.   Therapy/Group: Individual Therapy  [name] A Frieda Arnall 02/15/2017, 4:18 PM

## 2017-02-16 ENCOUNTER — Inpatient Hospital Stay (HOSPITAL_COMMUNITY): Payer: Medicare Other

## 2017-02-16 DIAGNOSIS — I952 Hypotension due to drugs: Secondary | ICD-10-CM

## 2017-02-16 DIAGNOSIS — I5033 Acute on chronic diastolic (congestive) heart failure: Secondary | ICD-10-CM

## 2017-02-16 LAB — COMPREHENSIVE METABOLIC PANEL
ALT: 34 U/L (ref 17–63)
AST: 48 U/L — AB (ref 15–41)
Albumin: 2.7 g/dL — ABNORMAL LOW (ref 3.5–5.0)
Alkaline Phosphatase: 108 U/L (ref 38–126)
Anion gap: 12 (ref 5–15)
BILIRUBIN TOTAL: 0.6 mg/dL (ref 0.3–1.2)
BUN: 9 mg/dL (ref 6–20)
CHLORIDE: 102 mmol/L (ref 101–111)
CO2: 23 mmol/L (ref 22–32)
CREATININE: 1.12 mg/dL (ref 0.61–1.24)
Calcium: 8.7 mg/dL — ABNORMAL LOW (ref 8.9–10.3)
GFR calc Af Amer: >60 mL/min (ref >60)
GFR, EST NON AFRICAN AMERICAN: 58 mL/min — AB (ref >60)
Glucose, Bld: 100 mg/dL — ABNORMAL HIGH (ref 65–99)
Potassium: 3.7 mmol/L (ref 3.5–5.1)
Sodium: 137 mmol/L (ref 135–145)
TOTAL PROTEIN: 6.7 g/dL (ref 6.5–8.1)

## 2017-02-16 LAB — CBC
HCT: 37.3 % — ABNORMAL LOW (ref 39.0–52.0)
Hemoglobin: 11.5 g/dL — ABNORMAL LOW (ref 13.0–17.0)
MCH: 30.5 pg (ref 26.0–34.0)
MCHC: 30.8 g/dL (ref 30.0–36.0)
MCV: 98.9 fL (ref 78.0–100.0)
PLATELETS: 282 10*3/uL (ref 150–400)
RBC: 3.77 MIL/uL — ABNORMAL LOW (ref 4.22–5.81)
RDW: 17.7 % — AB (ref 11.5–15.5)
WBC: 8.3 10*3/uL (ref 4.0–10.5)

## 2017-02-16 LAB — AMMONIA: Ammonia: 22 umol/L (ref 9–35)

## 2017-02-16 LAB — GLUCOSE, CAPILLARY: Glucose-Capillary: 82 mg/dL (ref 65–99)

## 2017-02-16 NOTE — Progress Notes (Signed)
[facility]     PROGRESS NOTE    Subjective/Complaints:  no major problems over weekend. Just waking up this morning. Denies pain.   ROS: pt denies nausea, vomiting, diarrhea, cough, shortness of breath or chest pain        Objective: Vital Signs: Blood pressure 99/63, pulse 85, temperature 97.6 F (36.4 C), temperature source Oral, resp. rate 18, weight 97.7 kg (215 lb 6.9 oz), SpO2 95 %. No results found. No results for input(s): WBC, HGB, HCT, PLT in the last 72 hours. No results for input(s): NA, K, CL, GLUCOSE, BUN, CREATININE, CALCIUM in the last 72 hours.  Invalid input(s): CO CBG (last 3)  No results for input(s): GLUCAP in the last 72 hours.  Wt Readings from Last 3 Encounters:  02/16/17 97.7 kg (215 lb 6.9 oz)  02/01/17 106 kg (233 lb 11 oz)  01/10/17 91.8 kg (202 lb 6.1 oz)    Physical Exam:  Constitutional: no distress.  Obese  HENT:  Head: Normocephalic. External ears normal. Eyes: Conjunctivae and EOM are normal. Right eye exhibits no discharge. Left eye exhibits no discharge.  Neck: Normal range of motion. Neck supple.  Cardiovascular:  IRR/IRR Respiratory: CTA B.  GI: Soft. Bowel sounds are normal.  Musculoskeletal: He exhibits tr to 1+ edema LUE Neurological: more alert, . Oriented to month/year/place Motor: 4-/5 UE. 3/5 HF,KE 4/5 distal.   Skin: Skin is warm and dry.  Psychiatric: He has a normal mood and affect. His behavior is normal.   Assessment/Plan: 1. Functional deficits secondary to debility after sepsis/multiple medical which require 3+ hours per day of interdisciplinary therapy in a comprehensive inpatient rehab setting. Physiatrist is providing close team supervision and 24 hour management of active medical problems listed below. Physiatrist and rehab team continue to assess barriers to discharge/monitor patient progress toward functional and medical goals.  Function:  Bathing Bathing position    Position: Wheelchair/chair at sink (bed level for LB)  Bathing parts Body parts bathed by patient: Right arm, Left arm, Chest, Abdomen, Front perineal area, Right upper leg, Left upper leg Body parts bathed by helper: Buttocks, Right lower leg, Left lower leg, Back  Bathing assist Assist Level:  (max A)      Upper Body Dressing/Undressing Upper body dressing   What is the patient wearing?: Pull over shirt/dress     Pull over shirt/dress - Perfomed by patient: Thread/unthread right sleeve, Thread/unthread left sleeve, Put head through opening Pull over shirt/dress - Perfomed by helper: Pull shirt over trunk        Upper body assist Assist Level: More than reasonable time   Set up : To obtain clothing/put away  Lower Body Dressing/Undressing Lower body dressing   What is the patient wearing?: Underwear, Pants, Ted Hose, Non-skid slipper socks   Underwear - Performed by helper: Thread/unthread right underwear leg, Thread/unthread left underwear leg, Pull underwear up/down Pants- Performed by patient: Pull pants up/down Pants- Performed by helper: Thread/unthread right pants leg, Thread/unthread left pants leg, Pull pants up/down   Non-skid slipper socks- Performed by helper: Don/doff right sock, Don/doff left sock               TED Hose - Performed by helper: Don/doff right TED hose, Don/doff left TED hose  Lower body assist Assist for lower body dressing:  (total A)      Toileting Toileting Toileting activity did not occur: No continent bowel/bladder event Toileting steps completed by patient: Adjust clothing prior  to toileting Toileting steps completed by helper: Performs perineal hygiene, Adjust clothing after toileting Toileting Assistive Devices: Grab bar or rail  Toileting assist Assist level: Touching or steadying assistance (Pt.75%)   Transfers Chair/bed transfer   Chair/bed transfer method: Lateral scoot Chair/bed transfer assist level: 2 helpers Chair/bed  transfer assistive device: Mechanical lift Mechanical lift: Stedy   Locomotion Ambulation Ambulation activity did not occur: Safety/medical concerns         Wheelchair   Type: Manual Max wheelchair distance: 30 ft (BLE) Assist Level: Dependent (Pt equals 0%)  Cognition Comprehension Comprehension assist level: Follows complex conversation/direction with extra time/assistive device  Expression Expression assist level: Expresses complex ideas: With no assist  Social Interaction Social Interaction assist level: Interacts appropriately with others - No medications needed.  Problem Solving Problem solving assist level: Solves complex problems: With extra time  Memory Memory assist level: More than reasonable amount of time   Medical Problem List and Plan: 1.  Debilitation secondary to sepsis as well as history of bilateral SDH status post craniotomy 12/16/2016  -pt continues to struggle to progress  -stamina poor, cognition an issue  -appreciate Palliative care assistance  -he remains DNR  2.  DVT Prophylaxis/Anticoagulation: see below, SCD's 3. Pain Management: have stopped oxycodone due to confusion  - tylenol prn 4. Mood: Xanax 0.25 mg daily at bedtime, Lexapro 10 mg daily at bedtime---decreased to   -denies depression     -neuropsych following along   -continue to provide emotional support as possible 5. Neuropsych/mental status: This patient is capable of making decisions on his own behalf.  -holding all potentially neurosedating meds (except xanax)  -urine cx with 30k staph haemolyticus. Not sensitive to any reasonable abx options---will not treat  -ammonia level even higher on Saturday 152, and not addressed   -continue lactulose daily, re-ordered ammonia level today. Adjust dose pending level   -he has a history of ETOH abuse and LFT's were elevated earlier in the year   -CT of liver from 01/27/17 was normal   -I spoke with GI Friday who agreed with lactulose trial and  felt that this may be a result of his chronic illness and that his liver may not be functioning well enough to process ammonia efficiently. Will continue for a few days and observe for effect.  6. Skin/Wound Care: Routine skin checks 7. Fluids/Electrolytes/Nutrition: encourage PO.  -albumin still low, prealbumin 14      8. Seizure prophylaxis. Keppra 500 mg twice a day 9. ID/bacteremia. Septra since 4/8 10. COPD/OSA. Check oxygen saturations every shift 11. Atrial fibrillation/AVR/CABG/PPM. Continue low-dose aspirin as well as amiodarone 200 mg twice a day. Chronic Coumadin discontinued due to subdural hematomas 12. Diastolic congestive heart failure. Lasix 20 mg MWF.   -daily weights---weight at 98kg today again   -appreciate cardiology follow up 13. Hypertension. Currently with hypotension. Toprol-XL 12.5 mg daily on hold. Holding lasix too 14. Hyperlipidemia. Lipitor 15. Prostatic abscess: S/p TURP. Cont bactrim through 4/28 16. ABLA: Monitor CBC, hgb 9.6 Friday---follow up today 17. LUE DVT: Brachial vein. Not an anticoagulation candidate due to recent subdural hematoma and craniotomy  -continue to elevate/conservative care--  -edema slowly improving   LOS (Days) 15 A FACE TO FACE EVALUATION WAS PERFORMED  [name], MD 02/16/2017 8:31 AM

## 2017-02-16 NOTE — NC FL2 (Signed)
MEDICAID FL2 LEVEL OF CARE SCREENING TOOL     IDENTIFICATION  Patient Name: Alex [name] Birthdate: Apr 23, 1931 Sex: male Admission Date (Current Location): 02/01/2017  [facility] and [place] Number:  Producer, television/film/video and Address:  [facility] [place]. [facility], [address], [place], [place] [address]      Provider Number: [id number]  Attending Physician Name and Address:  [name], MD  Relative Name and Phone Number:       Current Level of Care: Other (Comment) (Acute Inpatient Rehabilitation Unit) Recommended Level of Care: Skilled Nursing Facility Prior Approval Number:    Date Approved/Denied:   PASRR Number: [id number]  Discharge Plan: SNF    Current Diagnoses: Patient Active Problem List   Diagnosis Date Noted  . Cognitive disorder   . Debility 02/01/2017  . History of subdural hematoma   . Anxiety state   . Seizure prophylaxis   . Bacteremia   . Pacemaker   . Chronic diastolic congestive heart failure (HCC)   . Benign essential HTN   . Hypotension   . Hyperlipidemia   . Prostatic abscess   . Acute blood loss anemia   . Acute deep vein thrombosis (DVT) of left upper extremity (HCC)   . BPH with urinary obstruction: Per urology 01/29/2017 01/29/2017  . FUO (fever of unknown origin)   . Prostate abscess   . Fever   . HCAP (healthcare-associated pneumonia)   . Bacteremia due to Klebsiella pneumoniae   . Left arm swelling   . Transient hypotension 01/22/2017  . Sepsis (HCC) 01/21/2017  . Peripheral edema   . Bacterial UTI   . Urinary retention   . Hypertension   . Reactive hypertension   . Transaminitis   . Traumatic brain injury with loss of consciousness of 31 minutes to 59 minutes (HCC) 12/19/2016  . Acute pulmonary edema (HCC) 12/12/2016  . Acute diastolic CHF (congestive heart failure) (HCC) 12/12/2016  . Atrial flutter (HCC) 12/12/2016  . Traumatic subdural bleed with LOC of 1 hour to 5 hours 59 minutes (HCC)  12/11/2016  . Class 1 obesity due to excess calories with body mass index (BMI) of 30.0 to 30.9 in adult   . ETOH abuse   . Chronic obstructive pulmonary disease (HCC)   . Supplemental oxygen dependent   . Alcohol use   . OSA on CPAP   . Atrial fibrillation (HCC)   . Coronary artery disease involving coronary bypass graft of native heart without angina pectoris   . Seizures (HCC)   . Dysphagia   . Bradycardia   . Hyperglycemia   . Agitation   . Hypokalemia   . Hypernatremia   . Leukocytosis   . Macrocytic anemia   . Thrombocytopenia (HCC)   . Traumatic subdural hematoma without loss of consciousness (HCC)   . Change in mental status   . Intracranial hematoma (HCC) 12/03/2016  . Sick sinus syndrome (HCC) 11/06/2015  . PAF (paroxysmal atrial fibrillation) (HCC) 11/14/2014  . SOB (shortness of breath) 10/31/2014  . Persistent atrial fibrillation (HCC) 10/31/2014  . Aortic stenosis, severe 07/28/2011  . 3-vessel coronary artery disease 07/28/2011  . Hyperlipidemia, mixed 07/28/2011    Orientation RESPIRATION BLADDER Height & Weight     Self, Situation, Place, Time  Normal Incontinent, External catheter Weight: 97.7 kg (215 lb 6.9 oz) Height:     BEHAVIORAL SYMPTOMS/MOOD NEUROLOGICAL BOWEL NUTRITION STATUS      Continent Diet  AMBULATORY STATUS COMMUNICATION OF NEEDS Skin  Extensive Assist Verbally Other (Comment) (MASD to bottom, left arm edema due to DVT)                       Personal Care Assistance Level of Assistance  Bathing, Feeding, Dressing Bathing Assistance: Maximum assistance Feeding assistance: Maximum assistance Dressing Assistance: Maximum assistance     Functional Limitations Info    Sight Info: Adequate Hearing Info: Adequate Speech Info: Adequate    SPECIAL CARE FACTORS FREQUENCY  PT (By licensed PT), OT (By licensed OT)     PT Frequency: 5x/wk OT Frequency: 5x/wk            Contractures Contractures Info: Not present     Additional Factors Info  Code Status, Allergies, Psychotropic Code Status Info: DNR Allergies Info: no known allergies Psychotropic Info: see med list         Current Medications (02/16/2017):  This is the current hospital active medication list Current Facility-Administered Medications  Medication Dose Route Frequency Provider Last Rate Last Dose  . acetaminophen (TYLENOL) tablet 325 mg  325 mg Oral Q4H PRN [name], MD   325 mg at 02/15/17 2037  . ALPRAZolam Prudy Feeler) tablet 0.25 mg  0.25 mg Oral QHS Ankit Karis Juba, MD   0.25 mg at 02/15/17 2037  . alum & mag hydroxide-simeth (MAALOX/MYLANTA) 200-200-20 MG/5ML suspension 30 mL  30 mL Oral Q4H PRN Jacquelynn Cree, PA-C      . amiodarone (PACERONE) tablet 200 mg  200 mg Oral Daily Leone Brand, NP   200 mg at 02/16/17 0801  . aspirin EC tablet 81 mg  81 mg Oral Daily Ankit Karis Juba, MD   81 mg at 02/16/17 0801  . atorvastatin (LIPITOR) tablet 40 mg  40 mg Oral q1800 Ankit Karis Juba, MD   40 mg at 02/15/17 1749  . bisacodyl (DULCOLAX) suppository 10 mg  10 mg Rectal Daily PRN Jacquelynn Cree, PA-C   10 mg at 02/11/17 2151  . diphenhydrAMINE (BENADRYL) 12.5 MG/5ML elixir 12.5-25 mg  12.5-25 mg Oral Q6H PRN Evlyn Kanner Love, PA-C      . enoxaparin (LOVENOX) injection 40 mg  40 mg Subcutaneous Q24H Pamela S Love, PA-C   40 mg at 02/16/17 0805  . escitalopram (LEXAPRO) tablet 5 mg  5 mg Oral QHS [name], MD   5 mg at 02/15/17 2037  . furosemide (LASIX) tablet 20 mg  20 mg Oral Q M,W,F Leone Brand, NP   20 mg at 02/16/17 1006  . guaiFENesin-dextromethorphan (ROBITUSSIN DM) 100-10 MG/5ML syrup 5-10 mL  5-10 mL Oral Q6H PRN Jacquelynn Cree, PA-C      . hydrocerin (EUCERIN) cream   Topical BID Daniel J Angiulli, PA-C      . ipratropium-albuterol (DUONEB) 0.5-2.5 (3) MG/3ML nebulizer solution 3 mL  3 mL Nebulization Q6H PRN Ankit Karis Juba, MD      . lactulose (CHRONULAC) 10 GM/15ML solution 30 g  30 g Oral Daily [name], MD   30 g at 02/16/17 0802  . levETIRAcetam (KEPPRA) tablet 500 mg  500 mg Oral BID Ankit Karis Juba, MD   500 mg at 02/16/17 0800  . magnesium oxide (MAG-OX) tablet 200 mg  200 mg Oral Daily Ankit Karis Juba, MD   200 mg at 02/16/17 0801  . polyethylene glycol (MIRALAX / GLYCOLAX) packet 17 g  17 g Oral Daily PRN Evlyn Kanner Love, PA-C   17 g  at 02/11/17 1516  . prochlorperazine (COMPAZINE) tablet 5-10 mg  5-10 mg Oral Q6H PRN Jacquelynn Cree, PA-C       Or  . prochlorperazine (COMPAZINE) injection 5-10 mg  5-10 mg Intramuscular Q6H PRN Jacquelynn Cree, PA-C       Or  . prochlorperazine (COMPAZINE) suppository 12.5 mg  12.5 mg Rectal Q6H PRN Jacquelynn Cree, PA-C      . senna-docusate (Senokot-S) tablet 1 tablet  1 tablet Oral BID Mcarthur Rossetti Angiulli, PA-C   1 tablet at 02/15/17 2037  . sodium chloride (OCEAN) 0.65 % nasal spray 1 spray  1 spray Each Nare PRN Mcarthur Rossetti Angiulli, PA-C   1 spray at 02/06/17 1815  . sulfamethoxazole-trimethoprim (BACTRIM DS,SEPTRA DS) 800-160 MG per tablet 1 tablet  1 tablet Oral Q12H [name], MD   1 tablet at 02/16/17 0553  . traZODone (DESYREL) tablet 25-50 mg  25-50 mg Oral QHS PRN Jacquelynn Cree, PA-C   50 mg at 02/15/17 2037     Discharge Medications: Please see discharge summary for a list of discharge medications.  Relevant Imaging Results:  Relevant Lab Results:   Additional Information SSN: [id number]  Amada Jupiter, LCSW

## 2017-02-16 NOTE — Progress Notes (Signed)
Physical Therapy Note  Patient Details  Name: AUSTINE WIEDEMAN MRN: [id number] Date of Birth: [birth year] Today's Date: 02/16/2017  1455-1535, 40 min individual tx Pain: none  Rolling L><R using rails, supervision.  In R side lying, bil hip/knee flex/extension to facilitate supine> sit.  Mod assist to elevate trunk from R side lying.  R lateral leans onto raised HOB, practicing pushing through R elbow> hand for full elevation of trunk, x 5.  Stedy transfer to w/c.HR 86; O2 sats 95% on room air  Sit> stand at raised mat, with bil UE support on mat.  Wt shifting in standing L><R x 1 minute; terminal hip extension x 5 with bil UE support. Also in squatting, with bil UE support for wt shifting R><L x 2 cycles before fatigueing.  While PT pushed w/c, neuromuscular re-education via demo for bil knee ext to hold feet off of floor.  Pt left resting in w/c with quick release belt applied and all needs within reach; son present.  See function navigator for current status.   Annete Ayuso 02/16/2017, 12:33 PM

## 2017-02-16 NOTE — Progress Notes (Signed)
Progress Note  Patient Name: Alex [name] Date of Encounter: 02/16/2017  Primary Cardiologist:Dr. Excell Seltzer EP Dr. Johney Frame  Subjective   Has has shortness of breath only with exercise. No chest pain or palpitations.   Inpatient Medications    Scheduled Meds: . ALPRAZolam  0.25 mg Oral QHS  . amiodarone  200 mg Oral Daily  . aspirin EC  81 mg Oral Daily  . atorvastatin  40 mg Oral q1800  . enoxaparin (LOVENOX) injection  40 mg Subcutaneous Q24H  . escitalopram  5 mg Oral QHS  . furosemide  20 mg Oral Q M,W,F  . hydrocerin   Topical BID  . lactulose  30 g Oral Daily  . levETIRAcetam  500 mg Oral BID  . magnesium oxide  200 mg Oral Daily  . senna-docusate  1 tablet Oral BID  . sulfamethoxazole-trimethoprim  1 tablet Oral Q12H   Continuous Infusions:  PRN Meds: acetaminophen, alum & mag hydroxide-simeth, bisacodyl, diphenhydrAMINE, guaiFENesin-dextromethorphan, ipratropium-albuterol, polyethylene glycol, prochlorperazine **OR** prochlorperazine **OR** prochlorperazine, sodium chloride, traZODone   Vital Signs    Vitals:   02/14/17 1526 02/15/17 0447 02/15/17 1753 02/16/17 0441  BP: (!) 93/44 (!) 105/44 (!) 101/53 99/63  Pulse: 94 77 88 85  Resp: Temp: 97.8 F (36.6 C) 97.4 F (36.3 C) 97.6 F (36.4 C) 97.6 F (36.4 C)  TempSrc: Oral Oral Oral Oral  SpO2:  97% 94% 95%  Weight:  214 lb 15.2 oz (97.5 kg)  215 lb 6.9 oz (97.7 kg)    Intake/Output Summary (Last 24 hours) at 02/16/17 0938 Last data filed at 02/16/17 0453  Gross per 24 hour  Intake              640 ml  Output              400 ml  Net              240 ml   Filed Weights   02/14/17 0501 02/15/17 0447 02/16/17 0441  Weight: 215 lb 6.2 oz (97.7 kg) 214 lb 15.2 oz (97.5 kg) 215 lb 6.9 oz (97.7 kg)    Telemetry    Not on telemetry   ECG    None today   Physical Exam   GEN: No acute distress sitting in wheel chair Neck: No JVD Cardiac:irregular , no murmurs, rubs, or gallops.    Respiratory: Clear to auscultation bilaterally. GI: Soft, nontender, non-distended  MS: No edema; No deformity. Neuro:  Nonfocal  Psych: Normal affect   Labs    Chemistry Recent Labs Lab 02/13/17 0548  NA 139  K 4.3  CL 105  CO2 28  GLUCOSE 83  BUN 9  CREATININE 0.96  CALCIUM 8.3*  PROT 5.5*  ALBUMIN 1.9*  AST 36  ALT 27  ALKPHOS 92  BILITOT 0.6  GFRNONAA >60  GFRAA >60  ANIONGAP 6     Hematology Recent Labs Lab 02/13/17 0548  WBC 7.3  RBC 3.21*  HGB 9.6*  HCT 31.3*  MCV 97.5  MCH 29.9  MCHC 30.7  RDW 17.5*  PLT 219    Cardiac EnzymesNo results for input(s): TROPONINI in the last 168 hours. No results for input(s): TROPIPOC in the last 168 hours.   BNPNo results for input(s): BNP, PROBNP in the last 168 hours.   DDimer No results for input(s): DDIMER in the last 168 hours.   Radiology    No results found.  Cardiac Studies  Echo 10/2014 Study Conclusions  - Left ventricle: The cavity size was normal. Wall thickness was normal. Systolic function was normal. The estimated ejection fraction was in the range of 55% to 60%. No evidence of thrombus. - Aortic valve: A bioprosthesis was present and functioning normally. No evidence of vegetation. There was trivial regurgitation. - Mitral valve: No evidence of vegetation. There was mild regurgitation. - Left atrium: No evidence of thrombus in the atrial cavity or appendage. No evidence of thrombus in the appendage. - Right atrium: No evidence of thrombus in the atrial cavity or appendage. - Atrial septum: No defect or patent foramen ovale was identified. - Tricuspid valve: No evidence of vegetation. - Pulmonic valve: No evidence of vegetation.  Impressions:  - Successful cardioversion. No cardiac source of emboli was indentified.  Patient Profile      83 y/o male with h/o CAD s/p CABG x 3 in 2012 with bioprosthetic aortic valve replacement for aortic stenosis,  SSS s/p PPM,  atrial fibrillation/flutter w/ CHA2DS2 VASc score of 3, previously on coumadin (off due to subdural hematoma after mechanical fall 11/2016 s/p Creniotomy) seen in inpatient rehab for dyspnea and LE edema.   Assessment & Plan    1. Chronic diastolic CHF  - Due to persistent hypotension he is now on lasix  MWF. His dyspnea improved. Only occurs with exercise. He is laying flat without any issue. Trace LE edema. Waynetta Sandy has been stable at 214-215 lb for the past 4 days. Continue current dose. - Advised leg elevation. Low sodium diet. Continue current dose of lasix. May give addition  if dyspnea/worsening edema. Hypotension limiting aggressive diuresis.   2. Persistent afib/aflutter - Rate controlled. CHA2DS2VASc  of at least 4.  But no anticoagulation due to recent subdural hematoma.  He is on 81 mg asa for DVT of upper ext. - Continue Amiodarone  qd.   [name], PA  02/16/2017, 9:38 AM    The patient was seen, examined and discussed with Bhagat,Bhavinkumar PA-C and I agree with the above.   83 y/o male with h/o CAD s/p CABG x 3 in 2012 with bioprosthetic aortic valve replacement for aortic stenosis,  SSS s/p PPM, atrial fibrillation/flutter w/ CHA2DS2 VASc score of 3, previously on coumadin (off due to subdural hematoma after mechanical fall 11/2016 s/p Creniotomy) seen in inpatient rehab for dyspnea and LE edema sec to acute on chronic diastolic CHF. His LE edema has resolved, he has minimal crackles at the left lung base, I would continue the same low dose lasix 3x/week considering hypotension and advanced age.   [name] 02/16/2017

## 2017-02-16 NOTE — Progress Notes (Signed)
Physical Therapy Session Note  Patient Details  Name: Alex [name] MRN: [id number] Date of Birth: [birth year]  Today's Date: 02/16/2017 PT Individual Time: 0825-0925 PT Individual Time Calculation (min): 60 min   Short Term Goals: Week 2:  PT Short Term Goal 1 (Week 2): Pt will perform all bed mobility tasks with mod assist +1. PT Short Term Goal 2 (Week 2): Pt will transfer sit<>stand with max assist +1. PT Short Term Goal 3 (Week 2): Pt will transfer bed<>w/c with max assist +1.   Skilled Therapeutic Interventions/Progress Updates:    Session focused on addressing overall functional endurance/activity tolerance, sit <> stands with use of Stedy, slideboard transfer training w/c <> mat on level surface with focus on on technique and energy conservation, and seated LE therex for functional strengthening to aid with overall endurance. Pt able to don shorts seated EOB and focused on standing while PT pulled up pants in [place]. Pt required mod/max assist for initial sit -> stand from [place] with bed elevated due to inability to stand from lower surface. From perched surface, pt able to complete sit <> stand with min assist. Pt able to maintain standing ~ overall before rest break needed. During slideboard transfer training, cues for head/hips relationship, hand placement, and technique with pt completing with min to mod assist on level surface of mat. Completed 10 reps with 2# ankle weights of hip marches and LAQ with 3 sec hold on BLE seated in w/c.  Therapy Documentation Precautions:  Precautions Precautions: Fall Precaution Comments: monitor O2, pt has LUE DVT (brachial) & currently not candidate for anticoagulation, recent hx of traumatic R SDH Restrictions Weight Bearing Restrictions: No   Pain:  Denies pain.    See Function Navigator for Current Functional Status.   Therapy/Group: Individual Therapy  Karolee Stamps Darrol Poke, PT, DPT  02/16/2017, 9:30 AM

## 2017-02-16 NOTE — Progress Notes (Signed)
Occupational Therapy Session Note  Patient Details  Name: Alex [name] MRN: [id number] Date of Birth: 08/23/1931  Today's Date: 02/16/2017 OT Individual Time: 1000-1100 OT Individual Time Calculation (min): 60 min    Short Term Goals: Week 2:  OT Short Term Goal 1 (Week 2): Pt will perform LB dressing with max A in order to decrease level of assist with functional task.  OT Short Term Goal 2 (Week 2): Pt will demonstrate min A dynamic sitting balance for 5 minutes during functional task.  OT Short Term Goal 3 (Week 2): Pt will perform UB dressing with min A in order to increase I with self care. OT Short Term Goal 4 (Week 2): Pt will perform toilet transfer with max A of 1 in order to reduce level of assitance with functional transfers.  Skilled Therapeutic Interventions/Progress Updates:    Pt resting in w/c upon arrival.  Pt engaged in BADL retraining including bathing/dressing with sit<>stand (Stedy) from w/c at sink. Pt doffed and completed UB bathing tasks at supervision level.  Pt required max A for sit>stand from w/c with Stedy.  Pt was able to maintain standing with BUE support for approx 30 seconds and rested on seat of Stedy to participate in LB bathing tasks.  Pt was able to lift B feet to facilitate threading of pants before standing up.  Pt completed grooming tasks seated in w/c at sink.  Pt transitioned to therapy gym and engaged in BUE therex with SciFit on level 3 for 7 mins.  Pt returned to room and remained in w/c with all needs within reach and QRB in place.   Therapy Documentation Precautions:  Precautions Precautions: Fall Precaution Comments: monitor O2, pt has LUE DVT (brachial) & currently not candidate for anticoagulation, recent hx of traumatic R SDH Restrictions Weight Bearing Restrictions: No Pain:  Pt denied pain ADL: ADL ADL Comments: See functional assessment tool  See Function Navigator for Current Functional Status.   Therapy/Group: Individual  Therapy  [name] 02/16/2017, 11:00 AM

## 2017-02-17 ENCOUNTER — Inpatient Hospital Stay (HOSPITAL_COMMUNITY): Payer: Medicare Other | Admitting: Physical Therapy

## 2017-02-17 ENCOUNTER — Inpatient Hospital Stay (HOSPITAL_COMMUNITY): Payer: Medicare Other

## 2017-02-17 ENCOUNTER — Inpatient Hospital Stay (HOSPITAL_COMMUNITY): Payer: Medicare Other | Admitting: *Deleted

## 2017-02-17 MED ORDER — ALPRAZOLAM 0.25 MG PO TABS: 0.2500 mg | ORAL_TABLET | Freq: Every day | ORAL | 0 refills | Status: DC

## 2017-02-17 NOTE — Progress Notes (Addendum)
[facility] PHYSICAL MEDICINE & REHABILITATION     PROGRESS NOTE    Subjective/Complaints: Up in chair. Just finished breakfast. Denies pain  ROS: pt denies nausea, vomiting, diarrhea, cough, shortness of breath or chest pain         Objective: Vital Signs: Blood pressure (!) 92/47, pulse 62, temperature 98 F (36.7 C), temperature source Oral, resp. rate 18, weight 93.4 kg (205 lb 14.6 oz), SpO2 92 %. No results found.  Recent Labs  02/16/17 1050  WBC 8.3  HGB 11.5*  HCT 37.3*  PLT 282    Recent Labs  02/16/17 1050  NA 137  K 3.7  CL 102  GLUCOSE 100*  BUN 9  CREATININE 1.12  CALCIUM 8.7*   CBG (last 3)   Recent Labs  02/15/17 1137  GLUCAP 82    Wt Readings from Last 3 Encounters:  02/17/17 93.4 kg (205 lb 14.6 oz)  02/01/17 106 kg (233 lb 11 oz)  01/10/17 91.8 kg (202 lb 6.1 oz)    Physical Exam:  Constitutional: no distress.  Obese  HENT:  Head: Normocephalic. External ears normal. Eyes: Conjunctivae and EOM are normal. Right eye exhibits no discharge. Left eye exhibits no discharge.  Neck: Normal range of motion. Neck supple.  Cardiovascular:  IRR/IRR Respiratory: CTA B.  GI: Soft. Bowel sounds are normal.  Musculoskeletal: He exhibits tr to 1+ edema LUE improving Neurological: more alert, . Oriented to month/year/hospital/person Motor: 4-/5 UE. 3/5 HF,KE 4/5 distally.   Skin: Skin is warm and dry.  Psychiatric: He has a normal mood and affect. His behavior is normal.   Assessment/Plan: 1. Functional deficits secondary to debility after sepsis/multiple medical which require 3+ hours per day of interdisciplinary therapy in a comprehensive inpatient rehab setting. Physiatrist is providing close team supervision and 24 hour management of active medical problems listed below. Physiatrist and rehab team continue to assess barriers to discharge/monitor patient progress toward functional and medical goals.  Function:  Bathing Bathing  position   Position: Wheelchair/chair at sink  Bathing parts Body parts bathed by patient: Right arm, Left arm, Chest, Abdomen, Front perineal area, Right upper leg, Left upper leg, Right lower leg, Left lower leg Body parts bathed by helper: Buttocks, Back  Bathing assist Assist Level:  (max A)      Upper Body Dressing/Undressing Upper body dressing   What is the patient wearing?: Pull over shirt/dress     Pull over shirt/dress - Perfomed by patient: Thread/unthread right sleeve, Thread/unthread left sleeve, Put head through opening, Pull shirt over trunk Pull over shirt/dress - Perfomed by helper: Pull shirt over trunk        Upper body assist Assist Level: Supervision or verbal cues   Set up : To obtain clothing/put away  Lower Body Dressing/Undressing Lower body dressing   What is the patient wearing?: Underwear, Pants, Non-skid slipper socks, Ted Hose Underwear - Performed by patient: Thread/unthread right underwear leg Underwear - Performed by helper: Thread/unthread left underwear leg, Pull underwear up/down Pants- Performed by patient: Thread/unthread right pants leg Pants- Performed by helper: Thread/unthread left pants leg, Pull pants up/down   Non-skid slipper socks- Performed by helper: Don/doff left sock, Don/doff right sock               TED Hose - Performed by helper: Don/doff right TED hose, Don/doff left TED hose  Lower body assist Assist for lower body dressing:  (total A)      Toileting Toileting Toileting activity did  not occur: No continent bowel/bladder event Toileting steps completed by patient: Adjust clothing prior to toileting Toileting steps completed by helper: Performs perineal hygiene, Adjust clothing after toileting Toileting Assistive Devices: Grab bar or rail  Toileting assist Assist level: Touching or steadying assistance (Pt.75%)   Transfers Chair/bed transfer   Chair/bed transfer method: Lateral scoot Chair/bed transfer assist  level: Maximal assist (Pt 25 - 49%/lift and lower) Chair/bed transfer assistive device: Mechanical lift Mechanical lift: Stedy   Locomotion Ambulation Ambulation activity did not occur: Safety/medical concerns         Wheelchair   Type: Manual Max wheelchair distance: 30 ft (BLE) Assist Level: Dependent (Pt equals 0%)  Cognition Comprehension Comprehension assist level: Follows complex conversation/direction with extra time/assistive device  Expression Expression assist level: Expresses complex ideas: With no assist  Social Interaction Social Interaction assist level: Interacts appropriately with others - No medications needed.  Problem Solving Problem solving assist level: Solves complex problems: With extra time  Memory Memory assist level: More than reasonable amount of time   Medical Problem List and Plan: 1.  Debilitation secondary to sepsis as well as history of bilateral SDH status post craniotomy 12/16/2016  -pt continues to struggle to progress  -stamina poor, cognition an issue  -appreciate Palliative care assistance  -he remains DNR  -from my standpoint, he's ready for SNF  2.  DVT Prophylaxis/Anticoagulation: see below, SCD's 3. Pain Management: have stopped oxycodone due to confusion  - tylenol prn 4. Mood: Xanax 0.25 mg daily at bedtime, Lexapro 10 mg daily at bedtime---decreased to   -denies depression     -neuropsych following along   -continue to provide emotional support as possible 5. Neuropsych/mental status: This patient is capable of making decisions on his own behalf.  -holding all potentially neurosedating meds (except xanax)  -urine cx with 30k staph haemolyticus. Not sensitive to any reasonable abx options---will not treat  -ammonia level down to 22 yesterday   -continue lactulose daily-recheck level in AM if still here---no diarrhea or AE's currently   -he has a history of ETOH abuse and LFT's were elevated earlier in the year   -CT of liver from  01/27/17 was normal   -I spoke with GI last Friday who agreed with lactulose trial and felt that this may be a result of his chronic illness and that his liver may not be functioning well enough to process ammonia efficiently. Will continue for a few days and observe for effect.  6. Skin/Wound Care: Routine skin checks 7. Fluids/Electrolytes/Nutrition: encourage PO.  -albumin still low, prealbumin 14      8. Seizure prophylaxis. Keppra 500 mg twice a day 9. ID/bacteremia. Septra since 4/8 10. COPD/OSA. Check oxygen saturations every shift 11. Atrial fibrillation/AVR/CABG/PPM. Continue low-dose aspirin as well as amiodarone 200 mg twice a day. Chronic Coumadin discontinued due to subdural hematomas 12. Diastolic congestive heart failure. Lasix 20 mg MWF.   -daily weights---weight at 98kg today again   -appreciate cardiology follow up 13. Hypertension. Currently with hypotension. Toprol-XL 12.5 mg daily on hold. Holding lasix too 14. Hyperlipidemia. Lipitor 15. Prostatic abscess: S/p TURP. Cont bactrim through 4/28 16. ABLA: Monitor CBC, hgb up 11. 5 on Monday 17. LUE DVT: Brachial vein. Not an anticoagulation candidate due to recent subdural hematoma and craniotomy  -continue to elevate/conservative care--  -edema slowly improving   LOS (Days) 16 A FACE TO FACE EVALUATION WAS PERFORMED  [name], MD 02/17/2017 8:42 AM

## 2017-02-17 NOTE — Discharge Summary (Signed)
Discharge summary job # [id number]

## 2017-02-17 NOTE — Progress Notes (Signed)
  Social Work Patient ID: Alex [name], male   DOB: 1931-01-14, 83 y.o.   MRN: [id number]   Have received SNF bed offer from [facility] for admit tomorrow.  Have discussed with pt and family and they are accepting offer and will plan transfer via ambulance.  MD and tx team aware and clearing pt for d/c.  Didier Brandenburg, LCSW

## 2017-02-17 NOTE — Progress Notes (Signed)
Physical Therapy Discharge Summary  Patient Details  Name: Alex [name] MRN: [id number] Date of Birth: Jan 21, 1931  Today's Date: 02/17/2017   Patient has met 2 of 8 long term goals due to improved activity tolerance and improved balance.  Patient to discharge at a wheelchair level and requires Max Assist of 1-2 people for transfers.  Reasons goals not met: Pt did not meet goals 2/2 poor endurance, strength, balance, & awareness. Pt was making slow progress towards goals and is limited by minimal activity tolerance.  Recommendation:  Patient will benefit from ongoing skilled PT services in skilled nursing facility setting to continue to advance safe functional mobility, address ongoing impairments in strength, endurance, balance, perception, transfers, bed mobility, w/c mobility, standing, ambulation, awareness, and minimize fall risk.  Equipment: TBD at next venue of care  Reasons for discharge: discharge from hospital and pt to d/c to SNF  Patient/family agrees with progress made and goals achieved: Yes  PT Discharge Precautions/Restrictions Precautions Precautions: Fall Precaution Comments: recent hx of traumatic R SDH, LUE DVT (not currently a candidate for anticoagulation) Restrictions Weight Bearing Restrictions: No  Vision/Perception  Perception Perception: Impaired Inattention/Neglect:  (impaired L attention)   Cognition Overall Cognitive Status: Impaired/Different from baseline Orientation Level: Oriented to person;Oriented to place Memory: Impaired Awareness: Impaired Problem Solving: Impaired Safety/Judgment: Impaired  Mobility Bed Mobility Bed Mobility: Supine to Sit;Sit to Supine Supine to Sit:  (mod assist<>supervision with hospital bed features) Sit to Supine: 5: Supervision Transfers Transfers: Yes Sit to Stand:  (max assist with 1-2 helpers from elevated bed with Stedy lift) Lateral/Scoot Transfers: With slide board (max of 1-2 people)  Locomotion   Ambulation Ambulation: No Gait Gait: No Stairs / Additional Locomotion Stairs: No Wheelchair Mobility Wheelchair Mobility: No   Trunk/Postural Assessment  Postural Control Postural Control:  (poor postural awareness overall)   Extremity Assessment  General weakness BLE  See Function Navigator for Current Functional Status.  Waunita Schooner 02/17/2017, 5:23 PM

## 2017-02-17 NOTE — Discharge Summary (Signed)
NAMEWINFORD, Alex [name] NO.:  [id number]  MEDICAL RECORD NO.:  [id number]  LOCATION:                                 FACILITY:  PHYSICIAN:  [name], M.D.DATE OF BIRTH:  04-Feb-1931  DATE OF ADMISSION:  02/01/2017 DATE OF DISCHARGE:  02/18/2017                              DISCHARGE SUMMARY   DISCHARGE DIAGNOSES: 1. Debilitation secondary to sepsis as well as history of bilateral     subdural hematoma with craniotomy, December 16, 2016. 2. SCDs for deep vein thrombosis prophylaxis. 3. Pain management. 4. Anxiety. 5. Seizure prophylaxis. 6. Bacteremia. 7. Chronic obstructive pulmonary disease. 8. Atrial fibrillation with aortic valve replacement, coronary artery     bypass grafting, and permanent pacemaker. 9. Diastolic congestive heart failure. 10.Hypertension. 11.Hyperlipidemia. 12.Benign prostatic hypertrophy. 13.Acute blood loss anemia. 14.Left upper extremity deep vein thrombosis, brachial vein. 15.History of alcohol abuse.  HISTORY OF PRESENT ILLNESS:  This is an 83 year old right-handed male with history of alcohol use, COPD, atrial fibrillation with AVR and pacemaker, as well as traumatic subdural hematoma with right frontotemporoparietal craniotomy receiving inpatient rehab services from December 19, 2016, until January 10, 2017, with discharge to home, ambulating minimal assist with assistive device.  Presented January 21, 2017, with decreased appetite, low-grade fever, hypotension, and dry cough.  Chest x-ray, suspect basilar infiltrates, possible aspiration. CT of the head, no acute changes.  Urine study negative.  Lactic acid 1.98.  BUN 19, creatinine 0.91.  Maintained on sepsis protocol. Intravenous fluids to maintain blood pressure.  Blood cultures grew out Klebsiella.  Urine cultures, E. coli as well as Klebsiella.  Cardiac rate remained controlled.  The patient on low-dose aspirin prior to admission.  Tolerating a regular diet.   Request had been made for Palliative Care consult to discuss goals of care.  ID was consulted. Workup with left upper extremity Dopplers due to some increased swelling showed a small brachial vein DVT.  CT abdomen and pelvis, worrisome for prostatitis as well as possible prostatic abscess.  Urology consulted. Underwent TURP.  Remained afebrile.  Physical and occupational therapy ongoing.  The patient was admitted for comprehensive rehab program.  PAST MEDICAL HISTORY:  See discharge diagnoses.  SOCIAL HISTORY:  Lives with wife, was using an assistive device prior to admission.  Functional status upon admission to rehab services was +2 physical assist sit to stand, max assist stand pivot transfers, moderate assist supine to sit, and max assistance activities of daily living.  PHYSICAL EXAMINATION:  VITAL SIGNS:  Blood pressure 93/51, pulse 57, temperature 97, and respirations 20. GENERAL:  This was an alert male.  He was able to answer basic questions. HEENT:  EOMs intact. NECK:  Supple.  Nontender.  No JVD. CARDIAC:  Irregularly irregular. LUNGS:  Clear to auscultation. ABDOMEN:  Mildly distended.  Soft, nontender. EXTREMITIES:  Motor strength 4-/5.  REHABILITATION HOSPITAL COURSE:  The patient was admitted to inpatient rehab services with therapies initiated on a 3-hour daily basis, consisting of physical therapy, occupational therapy, and rehabilitation nursing.  The following issues were addressed during the patient's rehabilitation stay pertaining to Mr. Kraemer' debilitation related to sepsis as well as recent bilateral  SDH with craniotomy.  He continued to struggle overall with his therapies, slow progress, stamina was poor, cognition was an issue.  Palliative Care continued to follow for goals of care.  SCDs for DVT prophylaxis.  He remained on low-dose aspirin. Pain management, his oxycodone was discontinued due to some confusion, using only Tylenol.  He continued on Xanax  as well as Lexapro for depression.  Emotional support provided.  Keppra ongoing for seizure prophylaxis.  He had completed his course of Septra on February 01, 2017, for bacteremia, followed by Infectious Disease.  He continued to be followed by Cardiology Service for atrial fibrillation and permanent pacemaker, who advised to continue low-dose aspirin as well as amiodarone 200 mg daily.  His chronic Coumadin that he had been on in the past had been discontinued due to subdural hematomas.  He was receiving Lasix 20 mg Mondays, Wednesdays, and Fridays.  Close monitoring of his weight.  With noted recent TURP and prostatic possible abscess where he had been on Septra since February 01, 2017, had since been resumed and will continue through February 21, 2017.  He remained afebrile. Acute blood loss anemia, 11.5 and stable.  In regard to his overall mental status, close monitoring of ammonia levels.  He had been placed on lactulose trials.  CT of the liver January 27, 2017, was normal. Context made with Gastroenterology, who agreed with lactulose trial and felt that elevated ammonia level result of his chronic illness and liver may not be functioning well enough to process as well as his history of alcohol use and LFTs had been elevated earlier in the year.  The patient received weekly collaborative interdisciplinary team conferences to discuss estimated length of stay, family teaching, and any barriers to discharge.  Again, limited stamina, working with energy conservation techniques.  He progressed to performing sliding board transfers, sit to stand with the use of a steady lift.  Demonstrated deficits with muscle weakness, decreased cardiorespiratory endurance, decreased oxygen support, decreased coordination and visual perceptual skills, and decreased attention to the left.  Working with bathing and dressing, sit to stand.  He can perform upper body bathing task at supervision level. Due to his limited  endurance and the fact his wife could not provide the necessary assistance at home physically, it was felt skilled nursing facility was needed with bed becoming available on February 18, 2017.  DISCHARGE MEDICATIONS:  Included Xanax 0.25 mg p.o. at bedtime; amiodarone 200 mg p.o. daily; aspirin 81 mg p.o. daily; Lipitor 40 mg p.o. daily; Lexapro 5 mg p.o. at bedtime; Lasix 20 mg p.o. Monday, Wednesday, and Friday; lactulose 20 mg p.o. daily; Keppra 500 mg p.o. b.i.d.; magnesium oxide 200 mg p.o. daily; Senokot S one tablet p.o. b.i.d.; Bactrim DS one tablet p.o. every 12 hours until February 21, 2017, and stop; Tylenol as needed.  DIET:  Regular.  SPECIAL INSTRUCTIONS:  Continue to monitor ammonia levels.     Mariam Dollar, P.A.   ______________________________ [name], M.D.    DA/MEDQ  D:  02/17/2017  T:  02/17/2017  Job:  [id number]  cc:   Larita Fife at [facility], MD [name], M.D.

## 2017-02-17 NOTE — Progress Notes (Signed)
Occupational Therapy Note  Patient Details  Name: KYJUAN GAUSE MRN: [id number] Date of Birth: [birth year]  Today's Date: 02/17/2017 OT Individual Time: 1300-1400 OT Individual Time Calculation (min): 60 min   Pt denied pain Individual Therapy  Pt resting in w/c upon arrival with wife present.  Pt practiced sit<>stand with Stedy X 5 (max A to min A after repetition). Pt also engaged in dynamic sitting balance tasks.  Pt able to stand in [place] for approx 30 seconds X 5 with extended rest breaks.  Pt transferred to bed with Stedy and assisted with bed mobility to reposition.  Pt remained in bed with bed alarm activated and all needs within reach.   Lavone Neri [facility] 02/17/2017, 2:35 PM

## 2017-02-17 NOTE — Progress Notes (Addendum)
Physical Therapy Weekly Progress Note  Patient Details  Name: Alex [name] MRN: [id number] Date of Birth: 02/13/1931  Beginning of progress report period: February 09, 2017 End of progress report period: February 17, 2017  Today's Date: 02/17/2017  Patient has met 2 of 3 short term goals.  Pt is making slow progress towards LTGs that have been downgraded to mod/max assist overall. Pt has progressed to performing slide board transfers and sit<>stand transfers with use of Stedy lift. Pt would benefit from continued skilled PT treatment to address endurance, strength, bed mobility, transfers, and to increase independence with all functional mobility tasks prior to d/c to SNF.  Patient continues to demonstrate the following deficits muscle weakness, decreased cardiorespiratoy endurance and decreased oxygen support, decreased coordination, decreased visual perceptual skills, decreased attention to left, decreased attention, decreased awareness, decreased problem solving, decreased safety awareness, decreased memory and delayed processing, and decreased standing balance, decreased postural control and decreased balance strategies and therefore will continue to benefit from skilled PT intervention to increase functional independence with mobility.  Patient is slowly progressing toward long term goals..  Continue plan of care.  PT Short Term Goals Week 2:  PT Short Term Goal 1 (Week 2): Pt will perform all bed mobility tasks with mod assist +1. PT Short Term Goal 1 - Progress (Week 2): Met PT Short Term Goal 2 (Week 2): Pt will transfer sit<>stand with max assist +1. PT Short Term Goal 2 - Progress (Week 2): Met (with modifications (Stedy, elevated bed height)) PT Short Term Goal 3 (Week 2): Pt will transfer bed<>w/c with max assist +1. PT Short Term Goal 3 - Progress (Week 2): Progressing toward goal Week 3:  PT Short Term Goal 1 (Week 3): Pt will consistently transfer bed<>w/c with max assist +1. PT  Short Term Goal 2 (Week 3): Pt will stand 1 minute with max assist +1.   Therapy Documentation Precautions:  Precautions Precautions: Fall Precaution Comments: monitor O2, pt has LUE DVT (brachial) & currently not candidate for anticoagulation, recent hx of traumatic R SDH Restrictions Weight Bearing Restrictions: No   See Function Navigator for Current Functional Status.  Therapy/Group: Individual Therapy  Waunita Schooner 02/17/2017, 8:34 AM

## 2017-02-17 NOTE — Progress Notes (Signed)
Occupational Therapy Session Note  Patient Details  Name: Alex [name] MRN: [id number] Date of Birth: [birth year]  Today's Date: 02/17/2017 OT Individual Time: 0700-0759 OT Individual Time Calculation (min): 59 min    Short Term Goals: Week 2:  OT Short Term Goal 1 (Week 2): Pt will perform LB dressing with max A in order to decrease level of assist with functional task.  OT Short Term Goal 2 (Week 2): Pt will demonstrate min A dynamic sitting balance for 5 minutes during functional task.  OT Short Term Goal 3 (Week 2): Pt will perform UB dressing with min A in order to increase I with self care. OT Short Term Goal 4 (Week 2): Pt will perform toilet transfer with max A of 1 in order to reduce level of assitance with functional transfers.  Skilled Therapeutic Interventions/Progress Updates:    Pt resting in bed upon arrival and agreeable to engaging in therapy.  Pt sat EOB (HOB elevated) using bed rails without assistance.  Pt required mod A for sit<>stand with Stedy.  Pt engaged in bathing/dressing tasks with sit<>stand from w/c (with Stedy) at sink.  Pt completes UB bathing/dressing tasks at supervision level.  Pt required assistance with bathing buttocks and pulling up pants.  Pt is able to don socks and thread his RLE into underpants and pants.  Pt completed eating and grooming tasks at supervision level. Focus on activity tolerance, sit<>stand with Stedy, BADL retraining, increased functional use of LUE, and safety awareness to increased independence with BADLs.   Therapy Documentation Precautions:  Precautions Precautions: Fall Precaution Comments: monitor O2, pt has LUE DVT (brachial) & currently not candidate for anticoagulation, recent hx of traumatic R SDH Restrictions Weight Bearing Restrictions: No General:   Vital Signs: Therapy Vitals Temp: 98 F (36.7 C) Temp Source: Oral Pulse Rate: 62 Resp: 18 BP: (!) 92/47 Patient Position (if appropriate): Lying Oxygen  Therapy SpO2: 92 % O2 Device: Not Delivered Pain: Pain Assessment Pain Assessment: No/denies pain ADL: ADL ADL Comments: See functional assessment tool Vision/Perception     Exercises:   Other Treatments:    See Function Navigator for Current Functional Status.   Therapy/Group: Individual Therapy  [name] 02/17/2017, 8:03 AM

## 2017-02-17 NOTE — Progress Notes (Signed)
Physical Therapy Session Note  Patient Details  Name: Alex [name] MRN: [id number] Date of Birth: 11-Sep-1931  Today's Date: 02/17/2017 PT Individual Time: 0900-1000  PT Individual Time Calculation (min): 60 min   Short Term Goals: Week 2:  PT Short Term Goal 1 (Week 2): Pt will perform all bed mobility tasks with mod assist +1. PT Short Term Goal 2 (Week 2): Pt will transfer sit<>stand with max assist +1. PT Short Term Goal 3 (Week 2): Pt will transfer bed<>w/c with max assist +1.  Skilled Therapeutic Interventions/Progress Updates:  Pt received in w/c & agreeable to tx, noting buttock pain from sitting too long. Pt able to scoot back in w/c seat with cuing & reports "that feels better". Pt transferred w/c<>tilt table via maximove +2 assist. Pt tolerated tilt table for ~35 minutes with task focusing on upright tolerance and weight bearing through BLE.  0 degrees: BP = 104/57 mmHg, HR = 58 bpm (manual), SpO2 = 96% on room air 20 degrees after ~1 minute: BP = 93/51 mmHg, SpO2 = 96% 20 degrees after ~3 minutes: BP = 91/58 mmHg 30 degrees after ~ minute: BP = 64/31 mmHg (pt asymptomatic) Pt immediately returned to 20 degrees 20 degrees after ~1 minute: BP = 76/40 mmHg 20 degrees after ~3 minutes: BP = 80/42 mmHg Pt returned to 10 degrees, then 0 degrees  Therapist notified RN of pt's drop in BP when on tilt table and approved thigh high ted hose. Thigh high ted hose donned total assist for time management.   0 degrees after ~3 minutes: BP =  111/67 mmHg  Pt returned to sitting in w/c & BP = 96/46 mmHg  At end of session pt left sitting in w/c in room with QRB donned & all needs within reach.    During session pt unable to recall therapist's name and roles/positions of other staff members.    Therapy Documentation Precautions:  Precautions Precautions: Fall Precaution Comments: monitor O2, pt has LUE DVT (brachial) & currently not candidate for anticoagulation, recent hx of  traumatic R SDH Restrictions Weight Bearing Restrictions: No   See Function Navigator for Current Functional Status.   Therapy/Group: Individual Therapy  Sandi Mariscal 02/17/2017, 10:24 AM

## 2017-02-17 NOTE — Plan of Care (Signed)
Problem: RH Balance Goal: LTG Patient will maintain dynamic sitting balance (PT) LTG:  Patient will maintain dynamic sitting balance with assistance during mobility activities (PT)  Outcome: Not Met (add Reason) Not met 2/2 impaired balance Goal: LTG Patient will maintain dynamic standing balance (PT) LTG:  Patient will maintain dynamic standing balance with assistance during mobility activities (PT)  Outcome: Not Met (add Reason) Not met 2/2 impaired balance  Problem: RH Bed Mobility Goal: LTG Patient will perform bed mobility with assist (PT) LTG: Patient will perform bed mobility with assistance, with/without cues (PT).  Outcome: Completed/Met Date Met: 02/17/17 Mod assist<>supervision assist  Problem: RH Car Transfers Goal: LTG Patient will perform car transfers with assist (PT) LTG: Patient will perform car transfers with assistance (PT).  Outcome: Not Met (add Reason) Not attempted 2/2 safety/medical reasons  Problem: RH Furniture Transfers Goal: LTG Patient will perform furniture transfers w/assist (OT/PT LTG: Patient will perform furniture transfers  with assistance (OT/PT).  Outcome: Completed/Met Date Met: 02/17/17 From significantly elevated bed  Problem: RH Ambulation Goal: LTG Patient will ambulate in controlled environment (PT) LTG: Patient will ambulate in a controlled environment, # of feet with assistance (PT).  Outcome: Not Met (add Reason) Pt unable to ambulate at this time  Problem: RH Wheelchair Mobility Goal: LTG Patient will propel w/c in controlled environment (PT) LTG: Patient will propel wheelchair in controlled environment, # of feet with assist (PT)  Outcome: Not Met (add Reason) Pt utilized TIS w/c  Problem: RH Stairs Goal: LTG Patient will ambulate up and down stairs w/assist (PT) LTG: Patient will ambulate up and down # of stairs with assistance (PT)  Outcome: Not Met (add Reason) Not attempted 2/2 safety/medical reasons

## 2017-02-18 ENCOUNTER — Encounter: Payer: Medicare Other | Admitting: Physical Medicine & Rehabilitation

## 2017-02-18 LAB — AMMONIA: AMMONIA: 15 umol/L (ref 9–35)

## 2017-02-18 MED ORDER — ESCITALOPRAM OXALATE 5 MG PO TABS: 5.0000 mg | ORAL_TABLET | Freq: Every day | ORAL | Status: DC

## 2017-02-18 MED ORDER — MAGNESIUM OXIDE 400 (241.3 MG) MG PO TABS: 200.0000 mg | ORAL_TABLET | Freq: Every day | ORAL | Status: DC

## 2017-02-18 MED ORDER — SULFAMETHOXAZOLE-TRIMETHOPRIM 800-160 MG PO TABS: 1.0000 | ORAL_TABLET | Freq: Two times a day (BID) | ORAL | Status: DC

## 2017-02-18 MED ORDER — LEVETIRACETAM 500 MG PO TABS: 500.0000 mg | ORAL_TABLET | Freq: Two times a day (BID) | ORAL | 0 refills | Status: DC

## 2017-02-18 MED ORDER — LACTULOSE 10 GM/15ML PO SOLN
20.0000 g | Freq: Every day | ORAL | Status: DC
  Filled 2017-02-18: qty 30

## 2017-02-18 MED ORDER — FUROSEMIDE 20 MG PO TABS: 20.0000 mg | ORAL_TABLET | ORAL | Status: DC

## 2017-02-18 MED ORDER — LACTULOSE 10 GM/15ML PO SOLN: 20.0000 g | Freq: Every day | ORAL | Status: DC

## 2017-02-18 MED ORDER — ATORVASTATIN CALCIUM 40 MG PO TABS: 40.0000 mg | ORAL_TABLET | Freq: Every day | ORAL | 1 refills | Status: DC

## 2017-02-18 MED ORDER — AMIODARONE HCL 200 MG PO TABS: 200.0000 mg | ORAL_TABLET | Freq: Every day | ORAL | Status: DC

## 2017-02-18 MED ORDER — LACTULOSE 10 GM/15ML PO SOLN: 20.0000 g | Freq: Every day | ORAL | 0 refills | Status: DC

## 2017-02-18 NOTE — Progress Notes (Addendum)
Occupational Therapy Discharge Summary  Patient Details  Name: Alex [name] MRN: [id number] Date of Birth: 1931-06-07  Patient has met 9 of 14 long term goals due to improved activity tolerance, improved balance and postural control. Pt made slow progress with BADLs and functional transfers during this admission.  Pt continues to require use of mechanical lift Charlaine Dalton) for all functional transfers.  Pt is able to complete UB bathing and dressing tasks at supervision level when seated in w/c but continues to required max A for LB dressing tasks with sit<>stand (max A) in [place].  Pt requires max A for emergent awareness during functional activities. Pt's wife has been present for therapy.  Pt to discharge to SNF for continued rehab.  Patient to discharge at overall min to maxA  level.  Patient's care partner requires assistance to provide the necessary physical assistance at discharge therefore pt will d/c to SNF for further rehab at this time.    Reasons goals not met: Pt still requires max A for LB dressing instead of mod A. Pt continues to require a STEADY for safe transfers to the toilet and for toileting (requiring total A on the FIM) instead of a goal of mod A.  Pt also continues to required mod cues for emergent awareness.  Recommendation:  Patient will benefit from ongoing skilled OT services in skilled nursing facility setting to continue to advance functional skills in the area of BADL and Reduce care partner burden.  Equipment: No equipment provided Discharge to SNF  Reasons for discharge: discharge from hospital  Patient/family agrees with progress made and goals achieved: Yes  OT Discharge Vision/Perception  Vision- History Baseline Vision/History: Wears glasses Wears Glasses: Reading only Patient Visual Report: No change from baseline Vision- Assessment Vision Assessment?: No apparent visual deficits  Cognition Overall Cognitive Status: Impaired/Different from  baseline Arousal/Alertness: Awake/alert Orientation Level: Oriented X4 Attention: Sustained Focused Attention: Appears intact Sustained Attention: Appears intact Sustained Attention Impairment: Verbal basic;Functional basic Selective Attention: Impaired Memory: Impaired Memory Impairment: Decreased recall of new information Awareness: Impaired Awareness Impairment: Emergent impairment Problem Solving: Impaired Problem Solving Impairment: Verbal basic;Functional basic Safety/Judgment: Impaired Sensation Sensation Light Touch: Impaired by gross assessment Light Touch Impaired Details: Impaired LUE Stereognosis: Impaired by gross assessment Hot/Cold: Appears Intact Proprioception: Impaired Detail Proprioception Impaired Details: Impaired LUE Coordination Gross Motor Movements are Fluid and Coordinated: No Fine Motor Movements are Fluid and Coordinated: No Motor  Motor Motor - Skilled Clinical Observations: general weakness   Trunk/Postural Assessment  Cervical Assessment Cervical Assessment: Within Functional Limits Thoracic Assessment Thoracic Assessment: Within Functional Limits Lumbar Assessment Lumbar Assessment: Within Functional Limits Postural Control Trunk Control: delayed righting reactions  Balance Static Sitting Balance Static Sitting - Balance Support: Right upper extremity supported Static Sitting - Level of Assistance: 5: Stand by assistance Dynamic Sitting Balance Dynamic Sitting - Balance Support: Feet supported;Left upper extremity supported Dynamic Sitting - Level of Assistance: 5: Stand by assistance Extremity/Trunk Assessment RUE Assessment RUE Assessment: Within Functional Limits LUE Assessment LUE Assessment: Within Functional Limits   See Function Navigator for Current Functional Status.  Leotis Shames [facility] 02/18/2017, 6:44 AM   This OTR/L is in agreement with OTA about d/c recommendations at this time.  Frequent discussions occur  about pt's progress and problem solving.

## 2017-02-18 NOTE — Progress Notes (Signed)
Transport here to pick pt up for discharge, assisted to stretcher and taken out to ambulance via stretcher. Alex [name]

## 2017-02-18 NOTE — Progress Notes (Signed)
Report called to [facility] RN, no questions noted. Ride to pick up ~1100 today. Pt ready and aware of discharge. No questions noted. Pamelia Hoit

## 2017-02-18 NOTE — Patient Care Conference (Signed)
Inpatient RehabilitationTeam Conference and Plan of Care Update Date: 02/17/2017   Time: 2:40 PM    Patient Name: Alex [name]      Medical Record Number: [id number]  Date of Birth: 02-20-1931 Sex: Male         Room/Bed: 4W07C/4W07C-01 Payor Info: Payor: [employer] MEDICARE / Plan: [facility] MEDICARE / Product Type: *No Product type* /    Admitting Diagnosis: Debility Turp  Admit Date/Time:  02/01/2017 10:12 AM Admission Comments: No comment available   Primary Diagnosis:  Debility Principal Problem: Debility  Patient Active Problem List   Diagnosis Date Noted  . Cognitive disorder   . Debility 02/01/2017  . History of subdural hematoma   . Anxiety state   . Seizure prophylaxis   . Bacteremia   . Pacemaker   . Acute on chronic diastolic CHF (congestive heart failure), NYHA class 3 (HCC)   . Benign essential HTN   . Hypotension   . Hyperlipidemia   . Prostatic abscess   . Acute blood loss anemia   . Acute deep vein thrombosis (DVT) of left upper extremity (HCC)   . BPH with urinary obstruction: Per urology 01/29/2017 01/29/2017  . FUO (fever of unknown origin)   . Prostate abscess   . Fever   . HCAP (healthcare-associated pneumonia)   . Bacteremia due to Klebsiella pneumoniae   . Left arm swelling   . Transient hypotension 01/22/2017  . Sepsis (HCC) 01/21/2017  . Peripheral edema   . Bacterial UTI   . Urinary retention   . Hypertension   . Reactive hypertension   . Transaminitis   . Traumatic brain injury with loss of consciousness of 31 minutes to 59 minutes (HCC) 12/19/2016  . Acute pulmonary edema (HCC) 12/12/2016  . Acute diastolic CHF (congestive heart failure) (HCC) 12/12/2016  . Atrial flutter (HCC) 12/12/2016  . Traumatic subdural bleed with LOC of 1 hour to 5 hours 59 minutes (HCC) 12/11/2016  . Class 1 obesity due to excess calories with body mass index (BMI) of 30.0 to 30.9 in adult   . ETOH abuse   . Chronic obstructive pulmonary disease (HCC)   .  Supplemental oxygen dependent   . Alcohol use   . OSA on CPAP   . Atrial fibrillation (HCC)   . Coronary artery disease involving coronary bypass graft of native heart without angina pectoris   . Seizures (HCC)   . Dysphagia   . Bradycardia   . Hyperglycemia   . Agitation   . Hypokalemia   . Hypernatremia   . Leukocytosis   . Macrocytic anemia   . Thrombocytopenia (HCC)   . Traumatic subdural hematoma without loss of consciousness (HCC)   . Change in mental status   . Intracranial hematoma (HCC) 12/03/2016  . Sick sinus syndrome (HCC) 11/06/2015  . PAF (paroxysmal atrial fibrillation) (HCC) 11/14/2014  . SOB (shortness of breath) 10/31/2014  . Persistent atrial fibrillation (HCC) 10/31/2014  . Aortic stenosis, severe 07/28/2011  . 3-vessel coronary artery disease 07/28/2011  . Hyperlipidemia, mixed 07/28/2011    Expected Discharge Date: Expected Discharge Date: 02/18/17 (to SNF)  Team Members Present: Physician leading conference: Dr. Faith Rogue Social Worker Present: Amada Jupiter, LCSW Nurse Present: Carmie End, RN PT Present: Karolee Stamps, PT;Victoria Hyacinth Meeker, PT OT Present: Roney Mans, OT SLP Present: Feliberto Gottron, SLP PPS Coordinator present : Tora Duck, RN, CRRN     Current Status/Progress Goal Weekly Team Focus  Medical   confusion, may be related to ammonia  level elevated. cardiology consult for bp/cv mgt. now stable for discharge  improve activity tolerance/stamina  see prior   Bowel/Bladder   incontinent of b/b, condom cath at [facility], LBM-4/22  continent of b/b with min assist  timed toileting q 2 hours and as needed   Swallow/Nutrition/ Hydration             ADL's   Mod-Max A bathing bedlevel LB and w/c level UB; Min A UB dressing; Max A LB dressing;  Max A to+2 functional transfers  Supervision-Min A BADLs and functional transfers   Cognition, dynamic balance, functional transfers, pt/family education, adaptive bathing/dressing skills, endurance    Mobility   +2<>max assist overall for transfers, mod<>supervision for bed mobility  max assist ambulation, mod assist transfers, max assist stair negotiation  upright tolerance, weight bearing through BLE, strengthening, endurance training   Communication             Safety/Cognition/ Behavioral Observations            Pain   no c/o pain  <2  assess pain q shift and prn   Skin   MASD to bottom, barrier cream, L arm edema, weeping, DVT  no new skin breakdown while on IPR  assess skin q shift and prn, apply barrier cream as needed      *See Care Plan and progress notes for long and short-term goals.  Barriers to Discharge: confusion/size/weakness/lack of initiation    Possible Resolutions to Barriers:  supervision/SNF    Discharge Planning/Teaching Needs:  d/c plan changed to SNF - bed ready tomorrow.      Team Discussion:  Plan changed to SNF and SW reports bed ready for admission tomorrow. MD and team agreeable he is ready for transfer.  Revisions to Treatment Plan:  Change in d/c plan.   Continued Need for Acute Rehabilitation Level of Care: The patient requires daily medical management by a physician with specialized training in physical medicine and rehabilitation for the following conditions: Daily direction of a multidisciplinary physical rehabilitation program to ensure safe treatment while eliciting the highest outcome that is of practical value to the patient.: Yes Daily medical management of patient stability for increased activity during participation in an intensive rehabilitation regime.: Yes Daily analysis of laboratory values and/or radiology reports with any subsequent need for medication adjustment of medical intervention for : Cardiac problems;Pulmonary problems;Neurological problems  Steven Basso 02/18/2017, 9:30 AM

## 2017-02-18 NOTE — Progress Notes (Signed)
[facility] PHYSICAL MEDICINE & REHABILITATION     PROGRESS NOTE    Subjective/Complaints: In bed. Waking up. No issues reported  ROS: pt denies nausea, vomiting, diarrhea, cough, shortness of breath or chest pain         Objective: Vital Signs: Blood pressure (!) 76/42, pulse 67, temperature 97.7 F (36.5 C), temperature source Oral, resp. rate 18, weight 93.1 kg (205 lb 4 oz), SpO2 93 %. No results found.  Recent Labs  02/16/17 1050  WBC 8.3  HGB 11.5*  HCT 37.3*  PLT 282    Recent Labs  02/16/17 1050  NA 137  K 3.7  CL 102  GLUCOSE 100*  BUN 9  CREATININE 1.12  CALCIUM 8.7*   CBG (last 3)   Recent Labs  02/15/17 1137  GLUCAP 82    Wt Readings from Last 3 Encounters:  02/18/17 93.1 kg (205 lb 4 oz)  02/01/17 106 kg (233 lb 11 oz)  01/10/17 91.8 kg (202 lb 6.1 oz)    Physical Exam:  Constitutional: no distress.  Obese  HENT:  Head: Normocephalic. External ears normal. Eyes: Conjunctivae and EOM are normal. Right eye exhibits no discharge. Left eye exhibits no discharge.  Neck: Normal range of motion. Neck supple.  Cardiovascular:  IRR/IRR Respiratory: CTA B.  GI: Soft. Bowel sounds are normal.  Musculoskeletal: He exhibits tr to 1+ LUE edema Neurological: more alert, . Oriented to month/year/hospital/person Motor: 4-/5 UE. 3/5 HF,KE 4/5 distally.   Skin: Skin is warm and dry.  Psychiatric: He has a normal mood and affect. His behavior is normal.   Assessment/Plan: 1. Functional deficits secondary to debility after sepsis/multiple medical which require 3+ hours per day of interdisciplinary therapy in a comprehensive inpatient rehab setting. Physiatrist is providing close team supervision and 24 hour management of active medical problems listed below. Physiatrist and rehab team continue to assess barriers to discharge/monitor patient progress toward functional and medical goals.  Function:  Bathing Bathing position   Position:  Wheelchair/chair at sink  Bathing parts Body parts bathed by patient: Right arm, Left arm, Chest, Abdomen, Front perineal area, Right upper leg, Left upper leg, Right lower leg, Left lower leg Body parts bathed by helper: Buttocks, Back  Bathing assist Assist Level:  (max A)      Upper Body Dressing/Undressing Upper body dressing   What is the patient wearing?: Pull over shirt/dress     Pull over shirt/dress - Perfomed by patient: Thread/unthread right sleeve, Thread/unthread left sleeve, Put head through opening, Pull shirt over trunk Pull over shirt/dress - Perfomed by helper: Pull shirt over trunk        Upper body assist Assist Level: Supervision or verbal cues   Set up : To obtain clothing/put away  Lower Body Dressing/Undressing Lower body dressing   What is the patient wearing?: Underwear, Pants, Non-skid slipper socks, Ted Hose Underwear - Performed by patient: Thread/unthread right underwear leg Underwear - Performed by helper: Thread/unthread left underwear leg, Pull underwear up/down Pants- Performed by patient: Thread/unthread right pants leg Pants- Performed by helper: Thread/unthread left pants leg, Pull pants up/down   Non-skid slipper socks- Performed by helper: Don/doff left sock, Don/doff right sock               TED Hose - Performed by helper: Don/doff right TED hose, Don/doff left TED hose  Lower body assist Assist for lower body dressing:  (total A)      Toileting Toileting Toileting activity did not occur:  No continent bowel/bladder event Toileting steps completed by patient: Adjust clothing prior to toileting Toileting steps completed by helper: Performs perineal hygiene, Adjust clothing after toileting Toileting Assistive Devices: Grab bar or rail  Toileting assist Assist level: Touching or steadying assistance (Pt.75%)   Transfers Chair/bed transfer   Chair/bed transfer method: Lateral scoot Chair/bed transfer assist level: Maximal assist (Pt  25 - 49%/lift and lower) Chair/bed transfer assistive device: Mechanical lift Mechanical lift: Stedy   Locomotion Ambulation Ambulation activity did not occur: Safety/medical concerns         Wheelchair   Type: Manual Max wheelchair distance: 30 ft (BLE) Assist Level: Dependent (Pt equals 0%)  Cognition Comprehension Comprehension assist level: Follows complex conversation/direction with extra time/assistive device  Expression Expression assist level: Expresses complex ideas: With no assist  Social Interaction Social Interaction assist level: Interacts appropriately with others - No medications needed.  Problem Solving Problem solving assist level: Solves complex problems: With extra time  Memory Memory assist level: More than reasonable amount of time   Medical Problem List and Plan: 1.  Debilitation secondary to sepsis as well as history of bilateral SDH status post craniotomy 12/16/2016  -to SNF today  -follow up with me in about a month  2.  DVT Prophylaxis/Anticoagulation: see below, SCD's 3. Pain Management: have stopped oxycodone due to confusion  - tylenol prn 4. Mood: Xanax 0.25 mg daily at bedtime, Lexapro 10 mg daily at bedtime---decreased to   -denies depression     -neuropsych following along   -continue to provide emotional support as possible 5. Neuropsych/mental status: This patient is capable of making decisions on his own behalf.  -holding all potentially neurosedating meds (except xanax)  -urine cx with 30k staph haemolyticus. Not sensitive to any reasonable abx options---will not treat  -ammonia level down to 15 today   -continue lactulose daily at . Hold if he develops diarrhea   -he has a history of ETOH abuse and LFT's were elevated earlier in the year   -CT of liver from 01/27/17 was normal   -I spoke with GI last Friday who agreed with lactulose trial and felt that this may be a result of his chronic illness and that his liver may not be  functioning well enough to process ammonia efficiently. Will continue for a few days and observe for effect.  6. Skin/Wound Care: Routine skin checks 7. Fluids/Electrolytes/Nutrition:  .  -albumin still low, prealbumin 14, but patient eating 100% of meals now      8. Seizure prophylaxis. Keppra 500 mg twice a day 9. ID/bacteremia. Septra since 4/8 10. COPD/OSA. Check oxygen saturations every shift 11. Atrial fibrillation/AVR/CABG/PPM. Continue low-dose aspirin as well as amiodarone 200 mg twice a day. Chronic Coumadin discontinued due to subdural hematomas 12. Diastolic congestive heart failure. Lasix 20 mg MWF.   -daily weights---weight at 94kg today     -appreciate cardiology follow up 13. Hypertension. Currently with hypotension. Toprol-XL 12.5 mg daily on hold. Holding lasix too 14. Hyperlipidemia. Lipitor 15. Prostatic abscess: S/p TURP. Cont bactrim through 4/28 16. ABLA: Monitor CBC, hgb up 11. 5 on Monday 17. LUE DVT: Brachial vein. Not an anticoagulation candidate due to recent subdural hematoma and craniotomy  -continue to elevate/conservative care--  -edema slowly improving   LOS (Days) 17 A FACE TO FACE EVALUATION WAS PERFORMED  Faith Rogue T, MD 02/18/2017 8:30 AM

## 2017-02-18 NOTE — Progress Notes (Signed)
Social Work  Discharge Note  The overall goal for the admission was met for:   Discharge location: No - plan changed to SNF due to extent of pt's care needs.  Length of Stay: Yes - 17 days  Discharge activity level: No  Home/community participation: No  Services provided included: MD, RD, PT, OT, SLP, RN, TR, Pharmacy, Neuropsych and SW  Financial Services:  [facility] Medicare  Follow-up services arranged: Other: SNF placement at [facility] [place] (or additional information):  Patient/Family verbalized understanding of follow-up arrangements: Yes  Individual responsible for coordination of the follow-up plan: wife  Confirmed correct DME delivered: NA    [name]

## 2017-03-02 DIAGNOSIS — S065X0D Traumatic subdural hemorrhage without loss of consciousness, subsequent encounter: Secondary | ICD-10-CM | POA: Diagnosis not present

## 2017-03-02 DIAGNOSIS — G4733 Obstructive sleep apnea (adult) (pediatric): Secondary | ICD-10-CM

## 2017-03-02 DIAGNOSIS — Z954 Presence of other heart-valve replacement: Secondary | ICD-10-CM

## 2017-03-02 DIAGNOSIS — Z96653 Presence of artificial knee joint, bilateral: Secondary | ICD-10-CM

## 2017-03-02 DIAGNOSIS — Z950 Presence of cardiac pacemaker: Secondary | ICD-10-CM

## 2017-03-02 DIAGNOSIS — I2510 Atherosclerotic heart disease of native coronary artery without angina pectoris: Secondary | ICD-10-CM

## 2017-03-02 DIAGNOSIS — F1011 Alcohol abuse, in remission: Secondary | ICD-10-CM

## 2017-03-02 DIAGNOSIS — J449 Chronic obstructive pulmonary disease, unspecified: Secondary | ICD-10-CM

## 2017-03-02 DIAGNOSIS — R531 Weakness: Secondary | ICD-10-CM

## 2017-03-02 DIAGNOSIS — G4089 Other seizures: Secondary | ICD-10-CM

## 2017-03-02 DIAGNOSIS — Z87891 Personal history of nicotine dependence: Secondary | ICD-10-CM

## 2017-03-02 DIAGNOSIS — W19XXXD Unspecified fall, subsequent encounter: Secondary | ICD-10-CM

## 2017-03-02 DIAGNOSIS — I481 Persistent atrial fibrillation: Secondary | ICD-10-CM

## 2017-03-02 DIAGNOSIS — N390 Urinary tract infection, site not specified: Secondary | ICD-10-CM

## 2017-03-02 DIAGNOSIS — Z6831 Body mass index (BMI) 31.0-31.9, adult: Secondary | ICD-10-CM

## 2017-03-02 DIAGNOSIS — R4182 Altered mental status, unspecified: Secondary | ICD-10-CM

## 2017-03-02 DIAGNOSIS — Z7982 Long term (current) use of aspirin: Secondary | ICD-10-CM

## 2017-03-02 DIAGNOSIS — Z951 Presence of aortocoronary bypass graft: Secondary | ICD-10-CM

## 2017-03-25 ENCOUNTER — Encounter: Payer: Self-pay | Admitting: Physical Medicine & Rehabilitation

## 2017-03-25 ENCOUNTER — Encounter: Payer: Medicare Other | Attending: Physical Medicine & Rehabilitation | Admitting: Physical Medicine & Rehabilitation

## 2017-03-25 DIAGNOSIS — S5402XA Injury of ulnar nerve at forearm level, left arm, initial encounter: Secondary | ICD-10-CM | POA: Diagnosis not present

## 2017-03-25 DIAGNOSIS — Z950 Presence of cardiac pacemaker: Secondary | ICD-10-CM | POA: Diagnosis not present

## 2017-03-25 DIAGNOSIS — Z87891 Personal history of nicotine dependence: Secondary | ICD-10-CM | POA: Insufficient documentation

## 2017-03-25 DIAGNOSIS — R410 Disorientation, unspecified: Secondary | ICD-10-CM | POA: Insufficient documentation

## 2017-03-25 DIAGNOSIS — I2510 Atherosclerotic heart disease of native coronary artery without angina pectoris: Secondary | ICD-10-CM | POA: Insufficient documentation

## 2017-03-25 DIAGNOSIS — R32 Unspecified urinary incontinence: Secondary | ICD-10-CM | POA: Diagnosis not present

## 2017-03-25 DIAGNOSIS — Z951 Presence of aortocoronary bypass graft: Secondary | ICD-10-CM | POA: Diagnosis not present

## 2017-03-25 DIAGNOSIS — I82622 Acute embolism and thrombosis of deep veins of left upper extremity: Secondary | ICD-10-CM | POA: Diagnosis not present

## 2017-03-25 DIAGNOSIS — I5030 Unspecified diastolic (congestive) heart failure: Secondary | ICD-10-CM | POA: Insufficient documentation

## 2017-03-25 DIAGNOSIS — I481 Persistent atrial fibrillation: Secondary | ICD-10-CM | POA: Diagnosis not present

## 2017-03-25 DIAGNOSIS — J449 Chronic obstructive pulmonary disease, unspecified: Secondary | ICD-10-CM | POA: Diagnosis not present

## 2017-03-25 DIAGNOSIS — R5381 Other malaise: Secondary | ICD-10-CM | POA: Insufficient documentation

## 2017-03-25 DIAGNOSIS — E785 Hyperlipidemia, unspecified: Secondary | ICD-10-CM | POA: Diagnosis not present

## 2017-03-25 DIAGNOSIS — G4733 Obstructive sleep apnea (adult) (pediatric): Secondary | ICD-10-CM | POA: Diagnosis present

## 2017-03-25 NOTE — Patient Instructions (Signed)
PLEASE FEEL FREE TO CALL OUR OFFICE WITH ANY PROBLEMS OR QUESTIONS ([phone])      

## 2017-03-25 NOTE — Progress Notes (Signed)
Subjective:    Patient ID: Alex DuhamelJohn R [name], male    DOB: 07-06-1931, 83 y.o.   MRN: [id number]  HPI   [name] is back regarding his SDH and debility after sepsis. He was with [name] on inpatient rehab twice. [name]. He is receiving ongoing therapies. He is anxious to get home. He states he is stronger and able to do more on his own. He states he is getting up to the bathroom on his own, but had a fall recently when getting up on his own. Upon further questioning, he has fallen numerous times in fact He does bird baths on his own, but can't get in the shower on his own. He tends to be a little more confused at night.   He has had some occasional incontinence at night. He has been generally continent of bowels.   Alex [name] is anxious to get home but therapy is still recommending care at the SNF level, suggesting also that he may need long term care.   He remains on essentially the same medications he took while in the hospital including amiodarone, lasix, xanax, keppra, trazodone. His sleep has been fair except when he's had to void or has been incontinent      Pain Inventory Average Pain 0 Pain Right Now 0 My pain is na  In the last 24 hours, has pain interfered with the following? General activity 0 Relation with others 0 Enjoyment of life 0 What TIME of day is your pain at its worst? na Sleep (in general) Good  Pain is worse with: na Pain improves with: na Relief from Meds: na  Mobility walk with assistance use a walker ability to climb steps?  no do you drive?  no use a wheelchair  Function retired  Neuro/Psych bladder control problems bowel control problems tremor trouble walking confusion  Prior Studies Any changes since last visit?  no  Physicians involved in your care Any changes since last visit?  no   Family History  Problem Relation Age of Onset  . Alzheimer's disease Mother   . Tuberculosis Father   . Other Father    Spinal Meningitis   Social History   Social History  . Marital status: Married    Spouse name: N/A  . Number of children: N/A  . Years of education: N/A   Social History Main Topics  . Smoking status: Former Smoker    Packs/day: 0.50    Years: 30.00    Types: Cigarettes    Start date: 10/27/1973    Quit date: 10/28/2003  . Smokeless tobacco: Never Used  . Alcohol use 0.0 oz/week     Comment: 8 oz of wine per night  . Drug use: No  . Sexual activity: Not on file   Other Topics Concern  . Not on file   Social History Narrative   [place].  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   Past Surgical History:  Procedure Laterality Date  . AORTIC VALVE REPLACEMENT (AVR)/CORONARY ARTERY BYPASS GRAFTING (CABG)   08/05/2011   LIMA to LAD, sequential saphenous vein graft to third and fourth obtuse marginal branches of the circumflex, aortic valve replacement using a 23 mm Edwards pericardial valve  . APPENDECTOMY    . CARDIOVERSION N/A 11/14/2014   Procedure: CARDIOVERSION;  Surgeon: Donato SchultzMark Skains, MD;  Location: [facility] ENDOSCOPY;  Service: Cardiovascular;  Laterality: N/A;  . CARDIOVERSION N/A 11/20/2016   Procedure: CARDIOVERSION;  Surgeon:  Pricilla Riffle, MD;  Location: [facility] ENDOSCOPY;  Service: Cardiovascular;  Laterality: N/A;  . CAROTID ENDARTERECTOMY     Dr Lollie Sails  . CRANIOTOMY Right 12/16/2016   Procedure: CRANIOTOMY HEMATOMA EVACUATION SUBDURAL;  Surgeon: Loura Halt Ditty, MD;  Location: [facility] OR;  Service: Neurosurgery;  Laterality: Right;  . EP IMPLANTABLE DEVICE N/A 11/06/2015   Procedure: PPM Generator Changeout;for sick sinus syndrome with a MDT Adapta L PPM, chronically elevated RV threshold.  . permanent pacemaker     MDT EnRhythm implanted by Dr Lawanda Cousins in [place] for complete heart block with syncope  . REPLACEMENT TOTAL KNEE BILATERAL     2006  . TEE WITHOUT CARDIOVERSION N/A 11/14/2014   Procedure: TRANSESOPHAGEAL ECHOCARDIOGRAM (TEE);  Surgeon:  Donato Schultz, MD;  Location: [facility] ENDOSCOPY;  Service: Cardiovascular;  Laterality: N/A;  . TRANSURETHRAL INCISION OF PROSTATE N/A 01/29/2017   Procedure: TRANSURETHRAL UNROOFING OF A  PROSTATE ABSCESS;  Surgeon: Ihor Gully, MD;  Location: [facility] ORS;  Service: Urology;  Laterality: N/A;   Past Medical History:  Diagnosis Date  . Aortic stenosis    s/p AVR by Dr Laneta Simmers  . COPD (chronic obstructive pulmonary disease) (HCC)   . History of coronary artery disease    status post stenting of the marginal circumflex in 12/2003 and again in 2009  . Hyperlipidemia   . Morbid obesity (HCC)    weight 243 pounds, BMI 31.2kg/m2, BSA 2.36 square meters  . Obstructive sleep apnea    compliant with CPAP  . Persistent atrial fibrillation (HCC)    There were no vitals taken for this visit.  Opioid Risk Score:   Fall Risk Score:  `1  Depression screen PHQ 2/9  No flowsheet data found.  Review of Systems  Constitutional: Negative.   HENT: Negative.   Eyes: Negative.   Respiratory: Negative.   Cardiovascular: Negative.   Gastrointestinal: Negative.   Endocrine: Negative.   Genitourinary: Negative.   Musculoskeletal: Negative.   Skin: Negative.   Allergic/Immunologic: Negative.   Neurological: Negative.   Hematological: Negative.   Psychiatric/Behavioral: Negative.   All other systems reviewed and are negative.      Objective:   Physical Exam  Obese HENT:  Head: Normocephalic. External ears normal. Eyes: Conjunctivae and EOM are normal. Right eye exhibits no discharge. Left eye exhibits no discharge.  Neck: Normal range of motion. Neck supple.  Cardiovascular:  IRR/IRR--stable Respiratory: cta b  GI: Soft. Bowel sounds are normal.  Musculoskeletal: He exhibits tr to 1+ LUE edema---improved Neurological: more alert, . Oriented to month/year/hospital/person. Improved memory and awareness. Still a little impulsive.  Motor: 4-/5 UE. 4-/5 HF,KE 4/5 distally. ulnar sensory loss left hand,  normal intrinsic muscle strength/. Pt stood easily from chair. Romberg +. Tends to lean forward slightly with ambulation. Maintained balance fairly well.  Skin: Skin is warm and dry.  Psychiatric: He has a normal mood and affect. His behavior is normal       Assessment and Plan 1. Debilitation secondary to sepsis as well as history of bilateral SDH status post craniotomy 12/16/2016             -continue SNF  -discussed safety at length with pt and wife. He is not ready to go home but is making nice progress with mobility and balance 2. Mood: Xanax 0.25 mg daily at bedtime, Lexapro 5 mg daily at bedtime-  3. Seizure prophylaxis. Keppra 500 mg twice a day--would continue for now 4 COPD/OSA.  5. Atrial fibrillation/AVR/CABG/PPM.  low-dose aspirin as well as amiodarone 200 mg twice a day  6. Diastolic congestive heart failure. Lasix per medical team.  7. LUE DVT: Brachial vein. 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   Thirty minutes of face to face patient care time were spent during this visit. All questions were encouraged and answered. Greater than 50% of time during this encounter was spent counseling patient/family in regard to safety considerations, further rehab needs, prognosis, nerve injury dx.   Follow up in 6 weeks

## 2017-03-28 NOTE — Addendum Note (Signed)
Addendum  created 03/28/17 0949 by Mykenna Viele, MD   Sign clinical note    

## 2017-04-03 NOTE — Addendum Note (Signed)
Addendum  created 04/03/17 1141 by Clayson Riling, MD   Sign clinical note    

## 2017-05-11 ENCOUNTER — Encounter: Payer: Medicare Other | Admitting: Physical Medicine & Rehabilitation

## 2017-05-28 ENCOUNTER — Other Ambulatory Visit: Payer: Self-pay | Admitting: Internal Medicine

## 2017-06-12 ENCOUNTER — Telehealth: Payer: Self-pay | Admitting: *Deleted

## 2017-06-12 MED ORDER — AMIODARONE HCL 200 MG PO TABS: 200.0000 mg | ORAL_TABLET | Freq: Two times a day (BID) | ORAL | 3 refills | Status: DC

## 2017-06-12 NOTE — Telephone Encounter (Signed)
Spoke with pt's wife and pt has been home for some time and has been taking Amiodarone 200 mg bid not sure where this message came form but no documentation noted of decrease to 1 daily  Refill sent as requested ./cy

## 2017-06-12 NOTE — Telephone Encounter (Signed)
A msg was left on the refill vm by an unidentified caller but not the patient stating that the patients amiodarone dose was reduced to 200 mg qd by the skilled nursing facility. Patient was started on this medication by Rudi Coco at 200 mg bid on 12/01/16. They are requesting a refill be sent to [facility] in [place] by Dr Excell Seltzer or Dr Johney Frame. The message stated to call patients wife at [phone] if anything was needed. Okay to refill? Please advise. Thanks, MI

## 2017-07-08 ENCOUNTER — Other Ambulatory Visit: Payer: Self-pay | Admitting: Internal Medicine

## 2017-07-09 NOTE — Telephone Encounter (Signed)
Please call office and schedule appointment for further refills. 

## 2017-07-14 ENCOUNTER — Encounter: Payer: Self-pay | Admitting: Neurology

## 2017-07-14 ENCOUNTER — Ambulatory Visit (INDEPENDENT_AMBULATORY_CARE_PROVIDER_SITE_OTHER): Payer: Medicare Other | Admitting: Neurology

## 2017-07-14 VITALS — BP 106/62 | HR 91 | Ht 74.0 in | Wt 234.0 lb

## 2017-07-14 DIAGNOSIS — S065XAA Traumatic subdural hemorrhage with loss of consciousness status unknown, initial encounter: Secondary | ICD-10-CM | POA: Insufficient documentation

## 2017-07-14 DIAGNOSIS — S065X9A Traumatic subdural hemorrhage with loss of consciousness of unspecified duration, initial encounter: Secondary | ICD-10-CM

## 2017-07-14 DIAGNOSIS — I6200 Nontraumatic subdural hemorrhage, unspecified: Secondary | ICD-10-CM

## 2017-07-14 DIAGNOSIS — R413 Other amnesia: Secondary | ICD-10-CM | POA: Diagnosis not present

## 2017-07-14 NOTE — Progress Notes (Signed)
PATIENT: Alex [name] DOB: [birth year]  Chief Complaint  Patient presents with  . Seizures    He is here with his wife, Alex [name]. Reports having a fall in February 2018 resulting in a subdural hematoma.  His wife does not recall any seizure activity but he had an abnormal EEG while hospitalized and placed on medication.  He is currenlty taking Keppra , BID. He has completed all his therapy and feels he is getting back to his baseline.  He is walking unassisted but his wife feels he needs to be using a cane.  . Memory Loss    MMSE 24/30 - 9 animals.  He is still having some difficulty with memory.  Marland Kitchen PCP    [name]., MD     HISTORICAL  Alex [name] is 83 years old male, seen in refer by his primary care physician Dr. Alben Spittle, Ellin Mayhew. for evaluation of seizure, he is accompanied by her wife Alex [name] at today's clinical visit, initial evaluation was on July 14 2017.  Reviewed and summarized extensive hospital record, he had a past medical history of aortic stenosis status post aortic valve  paroxysmal atrial fibrillation, on chronic Coumadin treatment, status post pacemaker, COPD, coronary artery disease, status post CABG, hyperlipidemia,   He fell in early February, on December 01 2016, he fell at his house again, struck his head on concrete floor, 2 days later on December 03 2016, he complains of severe headaches, was brought into the emergency room, found to have right subdural hematoma, over the next couple weeks, gradually getting worse, eventually require right craniotomy evacuation, there was no clinical seizure, but EEG on December 05 2016 showed moderate diffuse slowing, epileptiform discharge on the right posterior temporal region, lasting 2-4 minutes, he has been treated with Keppra 500 mg twice a day,  He was later discharged for rehabilitation, we hospital admission again in April 2018 for sepsis, prostate abscess, require another round of inpatient rehabilitation, now  his back home,  He was a Garment/textile technologist, was noted to have mild memory loss even prior to the incident, now gradually getting worse, he has not driven since February, continue has mild unsteady gait   REVIEW OF SYSTEMS: Full 14 system review of systems performed and notable only for weight gain, fatigue, shortness of breath, decreased energy, memory loss   ALLERGIES: No Known Allergies  HOME MEDICATIONS: Current Outpatient Prescriptions  Medication Sig Dispense Refill  . ALPRAZolam (XANAX) 0.25 MG tablet Take 1 tablet (0.25 mg total) by mouth at bedtime. 6 tablet 0  . amiodarone (PACERONE) 200 MG tablet Take 1 tablet (200 mg total) by mouth 2 (two) times daily. 60 tablet 3  . aspirin 81 MG tablet Take 81 mg by mouth daily.    Marland Kitchen atorvastatin (LIPITOR) 80 MG tablet TAKE 1 TABLET BY MOUTH DAILY 90 tablet 1  . escitalopram (LEXAPRO) 5 MG tablet Take 1 tablet (5 mg total) by mouth at bedtime.    . furosemide (LASIX) 20 MG tablet Take 1 tablet (20 mg total) by mouth every Monday, Wednesday, and Friday. 30 tablet   . lactulose (CHRONULAC) 10 GM/15ML solution Take 30 mLs (20 g total) by mouth daily. 240 mL 0  . levETIRAcetam (KEPPRA) 500 MG tablet Take 1 tablet (500 mg total) by mouth 2 (two) times daily. 60 tablet 0  . magnesium oxide (MAG-OX) 400 (241.3 Mg) MG tablet Take 0.5 tablets (200 mg total) by mouth daily.     No current  facility-administered medications for this visit.     PAST MEDICAL HISTORY: Past Medical History:  Diagnosis Date  . Anxiety   . Aortic stenosis    s/p AVR by Dr Laneta Simmers  . COPD (chronic obstructive pulmonary disease) (HCC)   . History of coronary artery disease    status post stenting of the marginal circumflex in 12/2003 and again in 2009  . Hyperlipidemia   . Insomnia   . Morbid obesity (HCC)    weight 243 pounds, BMI 31.2kg/m2, BSA 2.36 square meters  . Obstructive sleep apnea    compliant with CPAP  . Persistent atrial fibrillation (HCC)   . Seizure  (HCC)   . Traumatic subdural hematoma without loss of consciousness (HCC)     PAST SURGICAL HISTORY: Past Surgical History:  Procedure Laterality Date  . AORTIC VALVE REPLACEMENT (AVR)/CORONARY ARTERY BYPASS GRAFTING (CABG)   08/05/2011   LIMA to LAD, sequential saphenous vein graft to third and fourth obtuse marginal branches of the circumflex, aortic valve replacement using a 23 mm Edwards pericardial valve  . APPENDECTOMY    . CARDIOVERSION N/A 11/14/2014   Procedure: CARDIOVERSION;  Surgeon: Donato Schultz, MD;  Location: [facility] ENDOSCOPY;  Service: Cardiovascular;  Laterality: N/A;  . CARDIOVERSION N/A 11/20/2016   Procedure: CARDIOVERSION;  Surgeon: Pricilla Riffle, MD;  Location: [facility] ENDOSCOPY;  Service: Cardiovascular;  Laterality: N/A;  . CAROTID ENDARTERECTOMY     Dr Lollie Sails  . CRANIOTOMY Right 12/16/2016   Procedure: CRANIOTOMY HEMATOMA EVACUATION SUBDURAL;  Surgeon: Loura Halt Ditty, MD;  Location: [facility] OR;  Service: Neurosurgery;  Laterality: Right;  . EP IMPLANTABLE DEVICE N/A 11/06/2015   Procedure: PPM Generator Changeout;for sick sinus syndrome with a MDT Adapta L PPM, chronically elevated RV threshold.  . permanent pacemaker     MDT EnRhythm implanted by Dr Lawanda Cousins in [place] for complete heart block with syncope  . REPLACEMENT TOTAL KNEE BILATERAL     2006  . TEE WITHOUT CARDIOVERSION N/A 11/14/2014   Procedure: TRANSESOPHAGEAL ECHOCARDIOGRAM (TEE);  Surgeon: Donato Schultz, MD;  Location: [facility] ENDOSCOPY;  Service: Cardiovascular;  Laterality: N/A;  . TRANSURETHRAL INCISION OF PROSTATE N/A 01/29/2017   Procedure: TRANSURETHRAL UNROOFING OF A  PROSTATE ABSCESS;  Surgeon: Ihor Gully, MD;  Location: [facility] ORS;  Service: Urology;  Laterality: N/A;    FAMILY HISTORY: Family History  Problem Relation Age of Onset  . Alzheimer's disease Mother   . Tuberculosis Father   . Other Father        Spinal Meningitis    SOCIAL HISTORY:  Social History   Social History  . Marital  status: Married    Spouse name: N/A  . Number of children: 2  . Years of education: 8th grade   Occupational History  . Retired    Social History Main Topics  . Smoking status: Former Smoker    Packs/day: 0.50    Years: 30.00    Types: Cigarettes    Start date: 10/27/1973    Quit date: 10/28/2003  . Smokeless tobacco: Never Used  . Alcohol use 0.0 oz/week     Comment: 3 glasses of wine per evening  . Drug use: No  . Sexual activity: Not on file   Other Topics Concern  . Not on file   Social History Narrative   Lives in [place] with spouse.  Owns a Designer, industrial/product.   Right-handed.   1 cup coffee per day.     PHYSICAL EXAM   Vitals:  07/14/17 0926  BP: 106/62  Pulse: 91  Weight: 234 lb (106.1 kg)  Height:  (1.88 m)    Not recorded      Body mass index is 30.04 kg/m.  PHYSICAL EXAMNIATION:  Gen: NAD, conversant, well nourised, obese, well groomed                     Cardiovascular: Regular rate rhythm, no peripheral edema, warm, nontender. Eyes: Conjunctivae clear without exudates or hemorrhage Neck: Supple, no carotid bruits. Pulmonary: Clear to auscultation bilaterally   NEUROLOGICAL EXAM:  MMSE - Mini Mental State Exam 07/14/2017  Orientation to time 2  Orientation to Place 5  Registration 3  Attention/ Calculation 4  Recall 1  Language- name 2 objects 2  Language- repeat 1  Language- follow 3 step command 3  Language- read & follow direction 1  Write a sentence 1  Copy design 1  Total score 24  animal naming 9   CRANIAL NERVES: CN II: Visual fields are full to confrontation. Fundoscopic exam is normal with sharp discs and no vascular changes. Pupils are round equal and briskly reactive to light. CN III, IV, VI: extraocular movement are normal. No ptosis. CN V: Facial sensation is intact to pinprick in all 3 divisions bilaterally. Corneal responses are intact.  CN VII: Face is symmetric with normal eye closure and  smile. CN VIII: Hearing is normal to rubbing fingers CN IX, X: Palate elevates symmetrically. Phonation is normal. CN XI: Head turning and shoulder shrug are intact CN XII: Tongue is midline with normal movements and no atrophy.  MOTOR: There is no pronator drift of out-stretched arms. Muscle bulk and tone are normal. Muscle strength is normal.  REFLEXES: Reflexes are 2+ and symmetric at the biceps, triceps, knees, and ankles. Plantar responses are flexor.  SENSORY: Intact to light touch, pinprick, positional sensation and vibratory sensation are intact in fingers and toes.  COORDINATION: Rapid alternating movements and fine finger movements are intact. There is no dysmetria on finger-to-nose and heel-knee-shin.    GAIT/STANCE: He needs push up to get up from seated position, cautious, mildly unsteady.   DIAGNOSTIC DATA (LABS, IMAGING, TESTING) - I reviewed patient records, labs, notes, testing and imaging myself where available.   ASSESSMENT AND PLAN  JASANI DOLNEY is a 83 y.o. male   History of right subdural hematoma in February 2018 Abnormal EEG, evidence of epileptiform discharges Mild cognitive impairment   Repeat CT head without contrast, not a candidate for MRI due to pacemaker   Laboratory evaluation for treatable cause of memory loss  Keppra 500 mg twice a day    Levert Feinstein, M.D. Ph.D.  [facility] [address], [address] [place], [place] [address] Ph: [phone] Fax: [phone]  CC:  [name]., MD

## 2017-07-15 LAB — TSH: TSH: 1.07 u[IU]/mL (ref 0.450–4.500)

## 2017-07-15 LAB — HGB A1C W/O EAG: Hgb A1c MFr Bld: 6.1 % — ABNORMAL HIGH (ref 4.8–5.6)

## 2017-07-15 LAB — FOLATE: Folate: 8.9 ng/mL (ref >3.0)

## 2017-07-15 LAB — VITAMIN B12: VITAMIN B 12: 531 pg/mL (ref 232–1245)

## 2017-07-15 LAB — RPR: RPR Ser Ql: NONREACTIVE

## 2017-07-15 LAB — C-REACTIVE PROTEIN: CRP: 4.4 mg/L (ref 0.0–4.9)

## 2017-07-16 ENCOUNTER — Ambulatory Visit (INDEPENDENT_AMBULATORY_CARE_PROVIDER_SITE_OTHER): Payer: Medicare Other | Admitting: Neurology

## 2017-07-16 DIAGNOSIS — S065X9A Traumatic subdural hemorrhage with loss of consciousness of unspecified duration, initial encounter: Secondary | ICD-10-CM

## 2017-07-16 DIAGNOSIS — S065XAA Traumatic subdural hemorrhage with loss of consciousness status unknown, initial encounter: Secondary | ICD-10-CM

## 2017-07-16 DIAGNOSIS — R410 Disorientation, unspecified: Secondary | ICD-10-CM

## 2017-07-16 DIAGNOSIS — R413 Other amnesia: Secondary | ICD-10-CM

## 2017-07-17 NOTE — Procedures (Signed)
   HISTORY: 83 years old male, with history of subdural hematoma, increased confusion afterwards  TECHNIQUE:  16 channel EEG was performed based on standard 10-16 international system. One channel was dedicated to EKG, which has demonstrates normal sinus rhythm of 66 beats per minutes.  Upon awakening, the posterior background activity was well-developed, in alpha range, 9 Hz, reactive to eye opening and closure. This is a very poor quality of study was frequent electrode artifact.  There was no evidence of epileptiform discharge.  Photic stimulation was performed, which induced a symmetric photic driving.  Hyperventilation was not performed.  No sleep was achieved.  CONCLUSION: This is a  normal yet technically limited EEG.  There is no electrodiagnostic evidence of epileptiform discharge.  Levert Feinstein, M.D. Ph.D.  [facility] [address] [place], [place] [address] Phone: [phone] Fax:      [phone]

## 2017-07-21 ENCOUNTER — Ambulatory Visit
Admission: RE | Admit: 2017-07-21 | Discharge: 2017-07-21 | Disposition: A | Discharge status: Home / Self Care | Payer: Medicare Other | Source: Ambulatory Visit | Attending: Neurology | Admitting: Neurology

## 2017-07-21 DIAGNOSIS — I6200 Nontraumatic subdural hemorrhage, unspecified: Secondary | ICD-10-CM | POA: Diagnosis not present

## 2017-07-21 DIAGNOSIS — R413 Other amnesia: Secondary | ICD-10-CM | POA: Diagnosis not present

## 2017-07-21 DIAGNOSIS — S065X9A Traumatic subdural hemorrhage with loss of consciousness of unspecified duration, initial encounter: Secondary | ICD-10-CM

## 2017-07-21 DIAGNOSIS — S065XAA Traumatic subdural hemorrhage with loss of consciousness status unknown, initial encounter: Secondary | ICD-10-CM

## 2017-07-23 ENCOUNTER — Telehealth: Payer: Self-pay | Admitting: Neurology

## 2017-07-23 NOTE — Telephone Encounter (Signed)
Please call patient repeat CT head without contrast showed evidence of residual chronic subdural hematoma on the right side of the brain, posterior surgical changes, generalized atrophy, small vessel disease, compared to previous scan on January 21 2017, there has been reduction in the right subdural hemorrhage,  I will review films with him at next follow-up visit  IMPRESSION:  Abnormal CT head (without) demonstrating: 1. Mild hyperdensities and thickening of the dura over the right frontal and insular regions, as well as over the bilateral tentorium cerebelli, consistent with mild residual chronic subdural hemorrhage. No mass effect or mid-line shift. 2. Right craniotomy and post-surgical changes.  3. Right parietal encephalomalcia.  4. Moderate periventricular and subcortical chronic small vessel ischemic disease. 5. No acute findings. 6. Compared to CT on 01/21/17 there has been reduction in right subdural hemorrhage.

## 2017-07-23 NOTE — Telephone Encounter (Signed)
Spoke to patient's wife on HIPAA - she is aware of results, verbalized understanding and will share them with her husband.  They will keep his pending follow up.

## 2017-07-27 ENCOUNTER — Telehealth: Payer: Self-pay

## 2017-07-27 ENCOUNTER — Ambulatory Visit (INDEPENDENT_AMBULATORY_CARE_PROVIDER_SITE_OTHER): Payer: Medicare Other | Admitting: Cardiovascular Disease

## 2017-07-27 ENCOUNTER — Encounter: Payer: Self-pay | Admitting: Cardiovascular Disease

## 2017-07-27 ENCOUNTER — Ambulatory Visit
Admission: RE | Admit: 2017-07-27 | Discharge: 2017-07-27 | Disposition: A | Discharge status: Home / Self Care | Payer: Medicare Other | Source: Ambulatory Visit | Attending: Cardiovascular Disease | Admitting: Cardiovascular Disease

## 2017-07-27 ENCOUNTER — Inpatient Hospital Stay (HOSPITAL_COMMUNITY)
Admission: AD | Admit: 2017-07-27 | Discharge: 2017-07-29 | DRG: 293 | Disposition: A | Discharge status: Home / Self Care | Payer: Medicare Other | Source: Ambulatory Visit | Attending: Cardiovascular Disease | Admitting: Cardiovascular Disease

## 2017-07-27 ENCOUNTER — Encounter (INDEPENDENT_AMBULATORY_CARE_PROVIDER_SITE_OTHER): Payer: Self-pay

## 2017-07-27 VITALS — BP 140/70 | HR 82 | Ht 73.0 in | Wt 247.4 lb

## 2017-07-27 DIAGNOSIS — I959 Hypotension, unspecified: Secondary | ICD-10-CM | POA: Diagnosis not present

## 2017-07-27 DIAGNOSIS — I700 Atherosclerosis of aorta: Secondary | ICD-10-CM | POA: Diagnosis not present

## 2017-07-27 DIAGNOSIS — Z820 Family history of epilepsy and other diseases of the nervous system: Secondary | ICD-10-CM | POA: Diagnosis not present

## 2017-07-27 DIAGNOSIS — Z79899 Other long term (current) drug therapy: Secondary | ICD-10-CM

## 2017-07-27 DIAGNOSIS — G4733 Obstructive sleep apnea (adult) (pediatric): Secondary | ICD-10-CM | POA: Diagnosis not present

## 2017-07-27 DIAGNOSIS — Z953 Presence of xenogenic heart valve: Secondary | ICD-10-CM | POA: Diagnosis not present

## 2017-07-27 DIAGNOSIS — I482 Chronic atrial fibrillation: Secondary | ICD-10-CM | POA: Diagnosis present

## 2017-07-27 DIAGNOSIS — F1090 Alcohol use, unspecified, uncomplicated: Secondary | ICD-10-CM | POA: Diagnosis present

## 2017-07-27 DIAGNOSIS — R0602 Shortness of breath: Secondary | ICD-10-CM

## 2017-07-27 DIAGNOSIS — Z951 Presence of aortocoronary bypass graft: Secondary | ICD-10-CM | POA: Diagnosis not present

## 2017-07-27 DIAGNOSIS — I2510 Atherosclerotic heart disease of native coronary artery without angina pectoris: Secondary | ICD-10-CM | POA: Diagnosis not present

## 2017-07-27 DIAGNOSIS — Z87891 Personal history of nicotine dependence: Secondary | ICD-10-CM | POA: Diagnosis not present

## 2017-07-27 DIAGNOSIS — E785 Hyperlipidemia, unspecified: Secondary | ICD-10-CM | POA: Diagnosis present

## 2017-07-27 DIAGNOSIS — I110 Hypertensive heart disease with heart failure: Secondary | ICD-10-CM | POA: Diagnosis not present

## 2017-07-27 DIAGNOSIS — J449 Chronic obstructive pulmonary disease, unspecified: Secondary | ICD-10-CM | POA: Diagnosis not present

## 2017-07-27 DIAGNOSIS — I5033 Acute on chronic diastolic (congestive) heart failure: Secondary | ICD-10-CM | POA: Diagnosis present

## 2017-07-27 DIAGNOSIS — Z7289 Other problems related to lifestyle: Secondary | ICD-10-CM | POA: Diagnosis present

## 2017-07-27 DIAGNOSIS — N289 Disorder of kidney and ureter, unspecified: Secondary | ICD-10-CM | POA: Diagnosis present

## 2017-07-27 DIAGNOSIS — I350 Nonrheumatic aortic (valve) stenosis: Secondary | ICD-10-CM | POA: Diagnosis present

## 2017-07-27 DIAGNOSIS — R0902 Hypoxemia: Secondary | ICD-10-CM | POA: Diagnosis not present

## 2017-07-27 DIAGNOSIS — I480 Paroxysmal atrial fibrillation: Secondary | ICD-10-CM | POA: Diagnosis not present

## 2017-07-27 DIAGNOSIS — Z789 Other specified health status: Secondary | ICD-10-CM | POA: Diagnosis present

## 2017-07-27 DIAGNOSIS — Z7982 Long term (current) use of aspirin: Secondary | ICD-10-CM

## 2017-07-27 DIAGNOSIS — I25810 Atherosclerosis of coronary artery bypass graft(s) without angina pectoris: Secondary | ICD-10-CM | POA: Diagnosis present

## 2017-07-27 DIAGNOSIS — Z950 Presence of cardiac pacemaker: Secondary | ICD-10-CM

## 2017-07-27 LAB — COMPREHENSIVE METABOLIC PANEL
ALBUMIN: 3.9 g/dL (ref 3.5–5.0)
ALT: 37 U/L (ref 17–63)
ANION GAP: 9 (ref 5–15)
AST: 43 U/L — ABNORMAL HIGH (ref 15–41)
Alkaline Phosphatase: 78 U/L (ref 38–126)
BILIRUBIN TOTAL: 1.2 mg/dL (ref 0.3–1.2)
BUN: 21 mg/dL — ABNORMAL HIGH (ref 6–20)
CHLORIDE: 106 mmol/L (ref 101–111)
CO2: 24 mmol/L (ref 22–32)
Calcium: 9.1 mg/dL (ref 8.9–10.3)
Creatinine, Ser: 1.23 mg/dL (ref 0.61–1.24)
GFR calc Af Amer: >60 mL/min (ref >60)
GFR calc non Af Amer: 52 mL/min — ABNORMAL LOW (ref >60)
GLUCOSE: 117 mg/dL — AB (ref 65–99)
POTASSIUM: 4.4 mmol/L (ref 3.5–5.1)
Sodium: 139 mmol/L (ref 135–145)
TOTAL PROTEIN: 7.3 g/dL (ref 6.5–8.1)

## 2017-07-27 LAB — CBC WITH DIFFERENTIAL/PLATELET
Basophils Absolute: 0.1 10*3/uL (ref 0.0–0.1)
Basophils Relative: 1 %
Eosinophils Absolute: 0.1 10*3/uL (ref 0.0–0.7)
Eosinophils Relative: 2 %
HEMATOCRIT: 38 % — AB (ref 39.0–52.0)
Hemoglobin: 11.2 g/dL — ABNORMAL LOW (ref 13.0–17.0)
LYMPHS PCT: 23 %
Lymphs Abs: 1.5 10*3/uL (ref 0.7–4.0)
MCH: 26.7 pg (ref 26.0–34.0)
MCHC: 29.5 g/dL — AB (ref 30.0–36.0)
MCV: 90.5 fL (ref 78.0–100.0)
MONO ABS: 0.6 10*3/uL (ref 0.1–1.0)
MONOS PCT: 9 %
NEUTROS ABS: 4.2 10*3/uL (ref 1.7–7.7)
Neutrophils Relative %: 65 %
Platelets: 220 10*3/uL (ref 150–400)
RBC: 4.2 MIL/uL — ABNORMAL LOW (ref 4.22–5.81)
RDW: 19 % — AB (ref 11.5–15.5)
WBC: 6.4 10*3/uL (ref 4.0–10.5)

## 2017-07-27 LAB — TROPONIN I

## 2017-07-27 LAB — PROTIME-INR
INR: 1.31
PROTHROMBIN TIME: 16.2 s — AB (ref 11.4–15.2)

## 2017-07-27 LAB — BRAIN NATRIURETIC PEPTIDE: B NATRIURETIC PEPTIDE 5: 469.3 pg/mL — AB (ref 0.0–100.0)

## 2017-07-27 LAB — D-DIMER, QUANTITATIVE (NOT AT ARMC): D DIMER QUANT: 6.31 ug{FEU}/mL — AB (ref 0.00–0.50)

## 2017-07-27 LAB — TSH: TSH: 2.098 u[IU]/mL (ref 0.350–4.500)

## 2017-07-27 MED ORDER — FOLIC ACID 1 MG PO TABS
1.0000 mg | ORAL_TABLET | Freq: Every day | ORAL | Status: DC
  Administered 2017-07-27 – 2017-07-29 (×3): 1 mg via ORAL
  Filled 2017-07-27 (×3): qty 1

## 2017-07-27 MED ORDER — SODIUM CHLORIDE 0.9 % IV SOLN: 250.0000 mL | INTRAVENOUS | Status: DC | PRN

## 2017-07-27 MED ORDER — ACETAMINOPHEN 325 MG PO TABS: 650.0000 mg | ORAL_TABLET | ORAL | Status: DC | PRN

## 2017-07-27 MED ORDER — VITAMIN B-1 100 MG PO TABS
100.0000 mg | ORAL_TABLET | Freq: Every day | ORAL | Status: DC
  Administered 2017-07-27 – 2017-07-29 (×3): 100 mg via ORAL
  Filled 2017-07-27 (×3): qty 1

## 2017-07-27 MED ORDER — ENOXAPARIN SODIUM 40 MG/0.4ML ~~LOC~~ SOLN
40.0000 mg | SUBCUTANEOUS | Status: DC
  Administered 2017-07-27 – 2017-07-28 (×2): 40 mg via SUBCUTANEOUS
  Filled 2017-07-27 (×2): qty 0.4

## 2017-07-27 MED ORDER — ALBUTEROL SULFATE (2.5 MG/3ML) 0.083% IN NEBU: 3.0000 mL | INHALATION_SOLUTION | Freq: Four times a day (QID) | RESPIRATORY_TRACT | Status: DC | PRN

## 2017-07-27 MED ORDER — LORAZEPAM 2 MG/ML IJ SOLN: 1.0000 mg | Freq: Four times a day (QID) | INTRAMUSCULAR | Status: DC | PRN

## 2017-07-27 MED ORDER — ESCITALOPRAM OXALATE 10 MG PO TABS
5.0000 mg | ORAL_TABLET | Freq: Every day | ORAL | Status: DC
  Administered 2017-07-27 – 2017-07-28 (×2): 5 mg via ORAL
  Filled 2017-07-27 (×2): qty 1

## 2017-07-27 MED ORDER — ADULT MULTIVITAMIN W/MINERALS CH
1.0000 | ORAL_TABLET | Freq: Every day | ORAL | Status: DC
  Administered 2017-07-27 – 2017-07-29 (×3): 1 via ORAL
  Filled 2017-07-27 (×3): qty 1

## 2017-07-27 MED ORDER — ONDANSETRON HCL 4 MG/2ML IJ SOLN: 4.0000 mg | Freq: Four times a day (QID) | INTRAMUSCULAR | Status: DC | PRN

## 2017-07-27 MED ORDER — MAGNESIUM OXIDE 400 (241.3 MG) MG PO TABS
200.0000 mg | ORAL_TABLET | Freq: Every day | ORAL | Status: DC
  Administered 2017-07-27 – 2017-07-29 (×3): 200 mg via ORAL
  Filled 2017-07-27 (×3): qty 1

## 2017-07-27 MED ORDER — ATORVASTATIN CALCIUM 80 MG PO TABS
80.0000 mg | ORAL_TABLET | Freq: Every day | ORAL | Status: DC
Start: 2017-07-27 — End: 2017-07-29
  Administered 2017-07-27 – 2017-07-29 (×3): 80 mg via ORAL
  Filled 2017-07-27 (×3): qty 1

## 2017-07-27 MED ORDER — SODIUM CHLORIDE 0.9% FLUSH
3.0000 mL | Freq: Two times a day (BID) | INTRAVENOUS | Status: DC
  Administered 2017-07-27 – 2017-07-29 (×4): 3 mL via INTRAVENOUS

## 2017-07-27 MED ORDER — THIAMINE HCL 100 MG/ML IJ SOLN: 100.0000 mg | Freq: Every day | INTRAMUSCULAR | Status: DC

## 2017-07-27 MED ORDER — LORAZEPAM 1 MG PO TABS
0.0000 mg | ORAL_TABLET | Freq: Four times a day (QID) | ORAL | Status: DC
  Filled 2017-07-27: qty 2

## 2017-07-27 MED ORDER — SODIUM CHLORIDE 0.9% FLUSH: 3.0000 mL | INTRAVENOUS | Status: DC | PRN

## 2017-07-27 MED ORDER — LORAZEPAM 1 MG PO TABS: 0.0000 mg | ORAL_TABLET | Freq: Two times a day (BID) | ORAL | Status: DC

## 2017-07-27 MED ORDER — LACTULOSE 10 GM/15ML PO SOLN
20.0000 g | Freq: Every day | ORAL | Status: DC
  Administered 2017-07-27 – 2017-07-29 (×3): 20 g via ORAL
  Filled 2017-07-27 (×3): qty 30

## 2017-07-27 MED ORDER — LORAZEPAM 1 MG PO TABS: 1.0000 mg | ORAL_TABLET | Freq: Four times a day (QID) | ORAL | Status: DC | PRN

## 2017-07-27 MED ORDER — ASPIRIN 81 MG PO CHEW
81.0000 mg | CHEWABLE_TABLET | Freq: Every day | ORAL | Status: DC
  Administered 2017-07-28 – 2017-07-29 (×2): 81 mg via ORAL
  Filled 2017-07-27 (×2): qty 1

## 2017-07-27 MED ORDER — LEVETIRACETAM 500 MG PO TABS
500.0000 mg | ORAL_TABLET | Freq: Two times a day (BID) | ORAL | Status: DC
  Administered 2017-07-27 – 2017-07-29 (×4): 500 mg via ORAL
  Filled 2017-07-27 (×4): qty 1

## 2017-07-27 MED ORDER — FUROSEMIDE 10 MG/ML IJ SOLN
40.0000 mg | Freq: Two times a day (BID) | INTRAMUSCULAR | Status: DC
  Administered 2017-07-27 – 2017-07-28 (×2): 40 mg via INTRAVENOUS
  Filled 2017-07-27 (×2): qty 4

## 2017-07-27 MED ORDER — TRAZODONE HCL 50 MG PO TABS
50.0000 mg | ORAL_TABLET | Freq: Every day | ORAL | Status: DC
  Administered 2017-07-27 – 2017-07-28 (×2): 50 mg via ORAL
  Filled 2017-07-27 (×2): qty 1

## 2017-07-27 MED ORDER — ALPRAZOLAM 0.25 MG PO TABS
0.2500 mg | ORAL_TABLET | Freq: Every day | ORAL | Status: DC
  Administered 2017-07-27 – 2017-07-28 (×2): 0.25 mg via ORAL
  Filled 2017-07-27 (×2): qty 1

## 2017-07-27 MED ORDER — AMIODARONE HCL 200 MG PO TABS
200.0000 mg | ORAL_TABLET | Freq: Two times a day (BID) | ORAL | Status: DC
  Administered 2017-07-27 – 2017-07-29 (×4): 200 mg via ORAL
  Filled 2017-07-27 (×4): qty 1

## 2017-07-27 MED ORDER — ASPIRIN EC 81 MG PO TBEC: 81.0000 mg | DELAYED_RELEASE_TABLET | Freq: Every day | ORAL | Status: DC

## 2017-07-27 MED ORDER — POTASSIUM CHLORIDE CRYS ER 10 MEQ PO TBCR
10.0000 meq | EXTENDED_RELEASE_TABLET | Freq: Two times a day (BID) | ORAL | Status: DC
  Administered 2017-07-27 – 2017-07-29 (×4): 10 meq via ORAL
  Filled 2017-07-27 (×4): qty 1

## 2017-07-27 NOTE — H&P (Signed)
Cardiology Office Note Date:  07/27/2017   ID:  Alex [name], DOB 19-Mar-1931, MRN [id number]  PCP:  [name]., MD  Cardiologist:  Tonny Bollman, MD                  Chief Complaint  Patient presents with  . Shortness of Breath    Add On  . Cough     History of Present Illness: Alex [name] is a 83 y.o. male who presents for evaluation of shortness of breath. He has a history of  diastolic heart failure, atrial fibrillation, aortic valve disease, and CAD. He has undergone permanent pacemaker placement.  He underwent cardiac catheterizationafter an abnormal stress test in 2014. This demonstrated multivessel CAD with mild to moderate nonobstructive RCA stenosis, severe LAD and left circumflex stenoses, and continued patency of the LIMA to LAD graft as well as the sequential vein graft to the obtuse marginal branches.  He has a history of subdural hematoma in February 2018 and a prolonged hospitalization and rehab stay at that time. I saw him during his hospitalization but his last office visit with me was in July 2017.   He is here with his wife today. She called in this morning because his breathing has become progressively worse. He is now short of breath with minimal activity such as walking a few steps. He also complains of orthopnea and abdominal distention, poor appetite. He has become more fatigued and lethargic at home. No chest pain or pressure. No leg swelling. No syncope or palpitations.         Past Medical History:  Diagnosis Date  . Anxiety   . Aortic stenosis    s/p AVR by Dr Laneta Simmers  . COPD (chronic obstructive pulmonary disease) (HCC)   . History of coronary artery disease    status post stenting of the marginal circumflex in 12/2003 and again in 2009  . Hyperlipidemia   . Insomnia   . Morbid obesity (HCC)    weight 243 pounds, BMI 31.2kg/m2, BSA 2.36 square meters  . Obstructive sleep apnea    compliant with CPAP  .  Persistent atrial fibrillation (HCC)   . Seizure (HCC)   . Traumatic subdural hematoma without loss of consciousness [facility])          Past Surgical History:  Procedure Laterality Date  . AORTIC VALVE REPLACEMENT (AVR)/CORONARY ARTERY BYPASS GRAFTING (CABG)   08/05/2011   LIMA to LAD, sequential saphenous vein graft to third and fourth obtuse marginal branches of the circumflex, aortic valve replacement using a 23 mm Edwards pericardial valve  . APPENDECTOMY    . CARDIOVERSION N/A 11/14/2014   Procedure: CARDIOVERSION;  Surgeon: Donato Schultz, MD;  Location: [facility] ENDOSCOPY;  Service: Cardiovascular;  Laterality: N/A;  . CARDIOVERSION N/A 11/20/2016   Procedure: CARDIOVERSION;  Surgeon: Pricilla Riffle, MD;  Location: [facility] ENDOSCOPY;  Service: Cardiovascular;  Laterality: N/A;  . CAROTID ENDARTERECTOMY     Dr Lollie Sails  . CRANIOTOMY Right 12/16/2016   Procedure: CRANIOTOMY HEMATOMA EVACUATION SUBDURAL;  Surgeon: Loura Halt Ditty, MD;  Location: [facility] OR;  Service: Neurosurgery;  Laterality: Right;  . EP IMPLANTABLE DEVICE N/A 11/06/2015   Procedure: PPM Generator Changeout;for sick sinus syndrome with a MDT Adapta L PPM, chronically elevated RV threshold.  . permanent pacemaker     MDT EnRhythm implanted by Dr Lawanda Cousins in [place] for complete heart block with syncope  . REPLACEMENT TOTAL KNEE BILATERAL     2006  .  TEE WITHOUT CARDIOVERSION N/A 11/14/2014   Procedure: TRANSESOPHAGEAL ECHOCARDIOGRAM (TEE);  Surgeon: Donato Schultz, MD;  Location: [facility] ENDOSCOPY;  Service: Cardiovascular;  Laterality: N/A;  . TRANSURETHRAL INCISION OF PROSTATE N/A 01/29/2017   Procedure: TRANSURETHRAL UNROOFING OF A  PROSTATE ABSCESS;  Surgeon: Ihor Gully, MD;  Location: [facility] ORS;  Service: Urology;  Laterality: N/A;          Current Outpatient Prescriptions  Medication Sig Dispense Refill  . ALPRAZolam (XANAX) 0.25 MG tablet Take 1 tablet (0.25 mg total) by mouth at bedtime. 6 tablet 0  .  amiodarone (PACERONE) 200 MG tablet Take 1 tablet (200 mg total) by mouth 2 (two) times daily. 60 tablet 3  . aspirin 81 MG tablet Take 81 mg by mouth daily.    Marland Kitchen atorvastatin (LIPITOR) 80 MG tablet TAKE 1 TABLET BY MOUTH DAILY 90 tablet 1  . escitalopram (LEXAPRO) 5 MG tablet Take 1 tablet (5 mg total) by mouth at bedtime.    . furosemide (LASIX) 20 MG tablet Take 1 tablet (20 mg total) by mouth every Monday, Wednesday, and Friday. 30 tablet   . lactulose (CHRONULAC) 10 GM/15ML solution Take 30 mLs (20 g total) by mouth daily. 240 mL 0  . levETIRAcetam (KEPPRA) 500 MG tablet Take 1 tablet (500 mg total) by mouth 2 (two) times daily. 60 tablet 0  . magnesium oxide (MAG-OX) 400 (241.3 Mg) MG tablet Take 0.5 tablets (200 mg total) by mouth daily.    Marland Kitchen PROAIR HFA 108 (90 Base) MCG/ACT inhaler Inhale 2 puffs into the lungs every 6 (six) hours as needed. wheezing  11  . traZODone (DESYREL) 50 MG tablet Take 50 mg by mouth at bedtime.  5   No current facility-administered medications for this visit.     Allergies:   Patient has no known allergies.   Social History:  The patient  reports that he quit smoking about 13 years ago. His smoking use included Cigarettes. He started smoking about 43 years ago. He has a 15.00 pack-year smoking history. He has never used smokeless tobacco. He reports that he drinks alcohol. He reports that he does not use drugs.   Family History:  The patient's family history includes Alzheimer's disease in his mother; Other in his father; Tuberculosis in his father.   ROS:  Please see the history of present illness.  Otherwise, review of systems is positive for weakness, fatigue, balance problems, easy bruising.  All other systems are reviewed and negative.   PHYSICAL EXAM: VS:  BP 140/70   Pulse 82   Ht  (1.854 m)   Wt 112.2 kg (247 lb 6.4 oz)   BMI 32.64 kg/m  , BMI Body mass index is 32.64 kg/m. GEN: elderly male, in no acute distress  HEENT:  normal  Neck: JVP elevated, no masses. No carotid bruits Cardiac: irregular with 2/6 SEM at the RUSB, no diastolic murmur              Respiratory:  clear to auscultation bilaterally, normal work of breathing GI: soft, nontender, distended, nontender MS: no deformity or atrophy  Ext: trace bilateral pretibial edema, pedal pulses 2+= bilaterally Skin: warm and dry, no rash Neuro:  Strength and sensation are intact Psych: euthymic mood, full affect  EKG:  EKG is ordered today. The ekg ordered today shows V-paced rhythm, no atrial activity identified  Recent Labs: 12/03/2016: B Natriuretic Peptide 358.8 12/07/2016: Magnesium 1.9 02/16/2017: ALT 34; BUN 9; Creatinine, Ser 1.12; Hemoglobin 11.5; Platelets  282; Potassium 3.7; Sodium 137 07/14/2017: TSH 1.070   Lipid Panel  Labs(Brief)  No results found for: CHOL, TRIG, HDL, CHOLHDL, VLDL, LDLCALC, LDLDIRECT         Wt Readings from Last 3 Encounters:  07/27/17 112.2 kg (247 lb 6.4 oz)  07/14/17 106.1 kg (234 lb)  02/18/17 93.1 kg (205 lb 4 oz)     Cardiac Studies Reviewed: Echo TEE 11-14-2014: Study Conclusions  - Left ventricle: The cavity size was normal. Wall thickness was normal. Systolic function was normal. The estimated ejection fraction was in the range of 55% to 60%. No evidence of thrombus. - Aortic valve: A bioprosthesis was present and functioning normally. No evidence of vegetation. There was trivial regurgitation. - Mitral valve: No evidence of vegetation. There was mild regurgitation. - Left atrium: No evidence of thrombus in the atrial cavity or appendage. No evidence of thrombus in the appendage. - Right atrium: No evidence of thrombus in the atrial cavity or appendage. - Atrial septum: No defect or patent foramen ovale was identified. - Tricuspid valve: No evidence of vegetation. - Pulmonic valve: No evidence of vegetation.  Impressions:  - Successful cardioversion. No cardiac source of  emboli was indentified.  CXR: performed but currently pending  ASSESSMENT AND PLAN: 1.  Acute on Chronic diastolic heart failure: NYHA 3-4 symptoms with evidence of volume overload on exam. Pt hypoxic with ambulation into the office but O2 saturation > 90 after he rested for a minute. Recommend hospital admission for IV diuresis, reassessment of LV function with echo, serial monitoring of lab work. Will hold on beta-blocker with acute decompensation. No ACE/ARB as LV systolic function normal and plans for IV diuresis.   2. Hyperlipidemia:  Continue atorvastatin  3. Atrial fibrillation: off anticoagulation with gait instability, hx subdural hematoma requiring surgery earlier this year.  4. Aortic valve disease: s/p bioprosthetic AVR: update echo  5. HTN: BP controlled  6. CAD, native vessel, without angina: continue same Rx, ASA 81 mg  7. Etoh: drinks 3-4 glasses of wine daily. Will write orders for CIWA protocol if needed.   Current medicines are reviewed with the patient today.  The patient does not have concerns regarding medicines.  Labs/ tests ordered today include:  No orders of the defined types were placed in this encounter.   Disposition:   Hospital admission  Signed, Tonny Bollman, MD  07/27/2017 1:54 PM    [facility] [address] [place], [place], [place]  [address] Phone: [phone]; Fax: [phone]

## 2017-07-27 NOTE — Patient Instructions (Addendum)
You have a bed at the hospital, but it isn't available yet.  They will call you on the [phone] number to notify you to come to the hospital.  When you arrive, go to [facility] Main Entrance A). There is free valet parking at this entrance. They will take you to your room.

## 2017-07-27 NOTE — Progress Notes (Signed)
Cardiology Office Note Date:  07/27/2017   ID:  Alex [name], DOB [birth year], MRN [id number]  PCP:  [name]., MD  Cardiologist:  Tonny Bollman, MD    Chief Complaint  Patient presents with  . Shortness of Breath    Add On  . Cough     History of Present Illness: Alex [name] is a 83 y.o. male who presents for evaluation of shortness of breath. He has a history of  diastolic heart failure, atrial fibrillation, aortic valve disease, and CAD. He has undergone permanent pacemaker placement.  He underwent cardiac catheterizationafter an abnormal stress test in 2014. This demonstrated multivessel CAD with mild to moderate nonobstructive RCA stenosis, severe LAD and left circumflex stenoses, and continued patency of the LIMA to LAD graft as well as the sequential vein graft to the obtuse marginal branches.  He has a history of subdural hematoma in February 2018 and a prolonged hospitalization and rehab stay at that time. I saw him during his hospitalization but his last office visit with me was in July 2017.   He is here with his wife today. She called in this morning because his breathing has become progressively worse. He is now short of breath with minimal activity such as walking a few steps. He also complains of orthopnea and abdominal distention, poor appetite. He has become more fatigued and lethargic at home. No chest pain or pressure. No leg swelling. No syncope or palpitations.     Past Medical History:  Diagnosis Date  . Anxiety   . Aortic stenosis    s/p AVR by Dr Laneta Simmers  . COPD (chronic obstructive pulmonary disease) (HCC)   . History of coronary artery disease    status post stenting of the marginal circumflex in 12/2003 and again in 2009  . Hyperlipidemia   . Insomnia   . Morbid obesity (HCC)    weight 243 pounds, BMI 31.2kg/m2, BSA 2.36 square meters  . Obstructive sleep apnea    compliant with CPAP  . Persistent atrial fibrillation (HCC)   . Seizure  (HCC)   . Traumatic subdural hematoma without loss of consciousness [facility])     Past Surgical History:  Procedure Laterality Date  . AORTIC VALVE REPLACEMENT (AVR)/CORONARY ARTERY BYPASS GRAFTING (CABG)   08/05/2011   LIMA to LAD, sequential saphenous vein graft to third and fourth obtuse marginal branches of the circumflex, aortic valve replacement using a 23 mm Edwards pericardial valve  . APPENDECTOMY    . CARDIOVERSION N/A 11/14/2014   Procedure: CARDIOVERSION;  Surgeon: Donato Schultz, MD;  Location: [facility] ENDOSCOPY;  Service: Cardiovascular;  Laterality: N/A;  . CARDIOVERSION N/A 11/20/2016   Procedure: CARDIOVERSION;  Surgeon: Pricilla Riffle, MD;  Location: [facility] ENDOSCOPY;  Service: Cardiovascular;  Laterality: N/A;  . CAROTID ENDARTERECTOMY     Dr Lollie Sails  . CRANIOTOMY Right 12/16/2016   Procedure: CRANIOTOMY HEMATOMA EVACUATION SUBDURAL;  Surgeon: Loura Halt Ditty, MD;  Location: [facility] OR;  Service: Neurosurgery;  Laterality: Right;  . EP IMPLANTABLE DEVICE N/A 11/06/2015   Procedure: PPM Generator Changeout;for sick sinus syndrome with a MDT Adapta L PPM, chronically elevated RV threshold.  . permanent pacemaker     MDT EnRhythm implanted by Dr Lawanda Cousins in [place] for complete heart block with syncope  . REPLACEMENT TOTAL KNEE BILATERAL     2006  . TEE WITHOUT CARDIOVERSION N/A 11/14/2014   Procedure: TRANSESOPHAGEAL ECHOCARDIOGRAM (TEE);  Surgeon: Donato Schultz, MD;  Location: [facility] ENDOSCOPY;  Service:  Cardiovascular;  Laterality: N/A;  . TRANSURETHRAL INCISION OF PROSTATE N/A 01/29/2017   Procedure: TRANSURETHRAL UNROOFING OF A  PROSTATE ABSCESS;  Surgeon: Ihor Gully, MD;  Location: [facility] ORS;  Service: Urology;  Laterality: N/A;    No current facility-administered medications for this visit.    No current outpatient prescriptions on file.   Facility-Administered Medications Ordered in Other Visits  Medication Dose Route Frequency Provider Last Rate Last Dose  . albuterol (PROVENTIL  HFA;VENTOLIN HFA) 108 (90 Base) MCG/ACT inhaler 2 puff  2 puff Inhalation Q6H PRN Tonny Bollman, MD      . ALPRAZolam Prudy Feeler) tablet 0.25 mg  0.25 mg Oral Mickel Crow, MD      . amiodarone (PACERONE) tablet 200 mg  200 mg Oral BID Tonny Bollman, MD      . aspirin tablet 81 mg  81 mg Oral Daily Tonny Bollman, MD      . atorvastatin (LIPITOR) tablet 80 mg  80 mg Oral Daily Tonny Bollman, MD      . escitalopram (LEXAPRO) tablet 5 mg  5 mg Oral Mickel Crow, MD      . folic acid (FOLVITE) tablet 1 mg  1 mg Oral Daily Tonny Bollman, MD      . lactulose [facility]) 10 GM/15ML solution 20 g  20 g Oral Daily Tonny Bollman, MD      . levETIRAcetam (KEPPRA) tablet 500 mg  500 mg Oral BID Tonny Bollman, MD      . LORazepam (ATIVAN) tablet 1 mg  1 mg Oral Q6H PRN Tonny Bollman, MD       Or  . LORazepam (ATIVAN) injection 1 mg  1 mg Intravenous Q6H PRN Tonny Bollman, MD      . LORazepam (ATIVAN) tablet 0-4 mg  0-4 mg Oral Q6H Tonny Bollman, MD       Followed by  . [START ON 07/29/2017] LORazepam (ATIVAN) tablet 0-4 mg  0-4 mg Oral Q12H Tonny Bollman, MD      . magnesium oxide (MAG-OX) tablet 200 mg  200 mg Oral Daily Tonny Bollman, MD      . multivitamin with minerals tablet 1 tablet  1 tablet Oral Daily Tonny Bollman, MD      . thiamine (VITAMIN B-1) tablet 100 mg  100 mg Oral Daily Tonny Bollman, MD       Or  . thiamine (B-1) injection 100 mg  100 mg Intravenous Daily Tonny Bollman, MD      . traZODone (DESYREL) tablet 50 mg  50 mg Oral Mickel Crow, MD        Allergies:   Patient has no known allergies.   Social History:  The patient  reports that he quit smoking about 13 years ago. His smoking use included Cigarettes. He started smoking about 43 years ago. He has a 15.00 pack-year smoking history. He has never used smokeless tobacco. He reports that he drinks alcohol. He reports that he does not use drugs.   Family History:  The patient's family  history includes Alzheimer's disease in his mother; Other in his father; Tuberculosis in his father.    ROS:  Please see the history of present illness.  Otherwise, review of systems is positive for weakness, fatigue, balance problems, easy bruising.  All other systems are reviewed and negative.    PHYSICAL EXAM: VS:  BP 140/70   Pulse 82   Ht  (1.854 m)   Wt 112.2 kg (247 lb 6.4 oz)  BMI 32.64 kg/m  , BMI Body mass index is 32.64 kg/m. GEN: elderly male, in no acute distress  HEENT: normal  Neck: JVP elevated, no masses. No carotid bruits Cardiac: irregular with 2/6 SEM at the RUSB, no diastolic murmur              Respiratory:  clear to auscultation bilaterally, normal work of breathing GI: soft, nontender, distended, nontender MS: no deformity or atrophy  Ext: trace bilateral pretibial edema, pedal pulses 2+= bilaterally Skin: warm and dry, no rash Neuro:  Strength and sensation are intact Psych: euthymic mood, full affect  EKG:  EKG is ordered today. The ekg ordered today shows V-paced rhythm, no atrial activity identified  Recent Labs: 12/03/2016: B Natriuretic Peptide 358.8 12/07/2016: Magnesium 1.9 02/16/2017: ALT 34; BUN 9; Creatinine, Ser 1.12; Hemoglobin 11.5; Platelets 282; Potassium 3.7; Sodium 137 07/14/2017: TSH 1.070   Lipid Panel  No results found for: CHOL, TRIG, HDL, CHOLHDL, VLDL, LDLCALC, LDLDIRECT    Wt Readings from Last 3 Encounters:  07/27/17 112.2 kg (247 lb 6.4 oz)  07/14/17 106.1 kg (234 lb)  02/18/17 93.1 kg (205 lb 4 oz)     Cardiac Studies Reviewed: Echo TEE 11-14-2014: Study Conclusions  - Left ventricle: The cavity size was normal. Wall thickness was normal. Systolic function was normal. The estimated ejection fraction was in the range of 55% to 60%. No evidence of thrombus. - Aortic valve: A bioprosthesis was present and functioning normally. No evidence of vegetation. There was trivial regurgitation. - Mitral valve: No  evidence of vegetation. There was mild regurgitation. - Left atrium: No evidence of thrombus in the atrial cavity or appendage. No evidence of thrombus in the appendage. - Right atrium: No evidence of thrombus in the atrial cavity or appendage. - Atrial septum: No defect or patent foramen ovale was identified. - Tricuspid valve: No evidence of vegetation. - Pulmonic valve: No evidence of vegetation.  Impressions:  - Successful cardioversion. No cardiac source of emboli was indentified.  CXR: performed but currently pending  ASSESSMENT AND PLAN: 1.  Acute on Chronic diastolic heart failure: NYHA 3-4 symptoms with evidence of volume overload on exam. Pt hypoxic with ambulation into the office but O2 saturation > 90 after he rested for a minute. Recommend hospital admission for IV diuresis, reassessment of LV function with echo, serial monitoring of lab work.   2. Hyperlipidemia:  Continue atorvastatin  3. Atrial fibrillation: off anticoagulation with gait instability, hx subdural hematoma requiring surgery earlier this year.  4. Aortic valve disease: s/p bioprosthetic AVR: update echo  5. HTN: BP controlled  6. CAD, native vessel, without angina: continue same Rx, ASA 81 mg  Current medicines are reviewed with the patient today.  The patient does not have concerns regarding medicines.  Labs/ tests ordered today include:   Orders Placed This Encounter  Procedures  . EKG 12-Lead    Disposition:   Hospital admission  Signed, Tonny Bollman, MD  07/27/2017 7:14 PM    [facility] [address] [place], [place], [place]  [address] Phone: [phone]; Fax: [phone]

## 2017-07-27 NOTE — Telephone Encounter (Signed)
Dr. Excell Seltzer spoke with patient's wife personally. The patient is having SOB. Per Dr. Excell Seltzer, xray ordered for patient to be done today prior to appointment at 1320. Patient's wife is agreeable to treatment plan.

## 2017-07-28 ENCOUNTER — Encounter (HOSPITAL_COMMUNITY): Payer: Self-pay | Admitting: *Deleted

## 2017-07-28 ENCOUNTER — Inpatient Hospital Stay (HOSPITAL_COMMUNITY): Payer: Medicare Other

## 2017-07-28 DIAGNOSIS — I5030 Unspecified diastolic (congestive) heart failure: Secondary | ICD-10-CM | POA: Diagnosis not present

## 2017-07-28 DIAGNOSIS — Z950 Presence of cardiac pacemaker: Secondary | ICD-10-CM | POA: Diagnosis not present

## 2017-07-28 DIAGNOSIS — I4891 Unspecified atrial fibrillation: Secondary | ICD-10-CM | POA: Diagnosis not present

## 2017-07-28 DIAGNOSIS — J42 Unspecified chronic bronchitis: Secondary | ICD-10-CM | POA: Diagnosis not present

## 2017-07-28 DIAGNOSIS — I481 Persistent atrial fibrillation: Secondary | ICD-10-CM

## 2017-07-28 DIAGNOSIS — I2510 Atherosclerotic heart disease of native coronary artery without angina pectoris: Secondary | ICD-10-CM | POA: Diagnosis not present

## 2017-07-28 DIAGNOSIS — I5033 Acute on chronic diastolic (congestive) heart failure: Secondary | ICD-10-CM

## 2017-07-28 DIAGNOSIS — Z789 Other specified health status: Secondary | ICD-10-CM

## 2017-07-28 DIAGNOSIS — I110 Hypertensive heart disease with heart failure: Secondary | ICD-10-CM | POA: Diagnosis not present

## 2017-07-28 LAB — BASIC METABOLIC PANEL
ANION GAP: 6 (ref 5–15)
BUN: 21 mg/dL — ABNORMAL HIGH (ref 6–20)
CALCIUM: 8.8 mg/dL — AB (ref 8.9–10.3)
CO2: 28 mmol/L (ref 22–32)
Chloride: 107 mmol/L (ref 101–111)
Creatinine, Ser: 1.37 mg/dL — ABNORMAL HIGH (ref 0.61–1.24)
GFR, EST AFRICAN AMERICAN: 53 mL/min — AB (ref >60)
GFR, EST NON AFRICAN AMERICAN: 46 mL/min — AB (ref >60)
GLUCOSE: 91 mg/dL (ref 65–99)
Potassium: 4.1 mmol/L (ref 3.5–5.1)
Sodium: 141 mmol/L (ref 135–145)

## 2017-07-28 LAB — ECHOCARDIOGRAM COMPLETE
Height: 73 in
WEIGHTICAEL: 3859.2 [oz_av]

## 2017-07-28 MED ORDER — FUROSEMIDE 40 MG PO TABS
40.0000 mg | ORAL_TABLET | Freq: Two times a day (BID) | ORAL | Status: DC
  Administered 2017-07-28 – 2017-07-29 (×2): 40 mg via ORAL
  Filled 2017-07-28 (×2): qty 1

## 2017-07-28 NOTE — Progress Notes (Signed)
90/49, HR 64; have notified cardiology, they advise ok to give all AM meds.

## 2017-07-28 NOTE — Progress Notes (Signed)
  Echocardiogram 2D Echocardiogram has been performed.  Alex [name] 07/28/2017, 2:40 PM

## 2017-07-28 NOTE — Progress Notes (Addendum)
Progress Note  Patient Name: Alex [name] Date of Encounter: 07/28/2017  Primary Cardiologist: Weaver/Cooper  Subjective   Feels much better, able to lie flat comfortably. However, breathing is not yet back to baseline. No edema. Net diuresis about 1L. Weight down 5 lb. Slight uptick in creatinine. Judging by multiple encounters in chart, dry weight is 232-235 lb. (still 6-9 lb above).  Inpatient Medications    Scheduled Meds: . ALPRAZolam  0.25 mg Oral QHS  . amiodarone  200 mg Oral BID  . aspirin  81 mg Oral Daily  . atorvastatin  80 mg Oral Daily  . enoxaparin (LOVENOX) injection  40 mg Subcutaneous Q24H  . escitalopram  5 mg Oral QHS  . folic acid  1 mg Oral Daily  . furosemide  40 mg Intravenous BID  . lactulose  20 g Oral Daily  . levETIRAcetam  500 mg Oral BID  . LORazepam  0-4 mg Oral Q6H   Followed by  . [START ON 07/29/2017] LORazepam  0-4 mg Oral Q12H  . magnesium oxide  200 mg Oral Daily  . multivitamin with minerals  1 tablet Oral Daily  . potassium chloride  10 mEq Oral BID  . sodium chloride flush  3 mL Intravenous Q12H  . thiamine  100 mg Oral Daily   Or  . thiamine  100 mg Intravenous Daily  . traZODone  50 mg Oral QHS   Continuous Infusions: . sodium chloride     PRN Meds: sodium chloride, acetaminophen, albuterol, LORazepam **OR** LORazepam, ondansetron (ZOFRAN) IV, sodium chloride flush   Vital Signs    Vitals:   07/27/17 2128 07/28/17 0050 07/28/17 0355 07/28/17 0832  BP: 111/65 (!) 104/53 137/69 (!) 90/49  Pulse: 73 76 72 64  Resp:  Temp:   98 F (36.7 C) 97.7 F (36.5 C)  TempSrc:   Oral Oral  SpO2:  96% 97% 91%  Weight:   241 lb 3.2 oz (109.4 kg)   Height:        Intake/Output Summary (Last 24 hours) at 07/28/17 1013 Last data filed at 07/28/17 0944  Gross per 24 hour  Intake              775 ml  Output             1700 ml  Net             -925 ml   Filed Weights   07/27/17 1928 07/28/17 0355  Weight: 246 lb  6.4 oz (111.8 kg) 241 lb 3.2 oz (109.4 kg)    Telemetry    Afib, V paced - Personally Reviewed  ECG    Afib, mostly V paced - Personally Reviewed  Physical Exam  Comfortable lying flat GEN: No acute distress.   Neck: No JVD Cardiac: irregular, split S2, 2/6 precordial systolic murmurno diastolic murmurs, rubs, or gallops.  Respiratory: Clear to auscultation bilaterally. GI: Soft, nontender, non-distended  MS: No edema; No deformity. Neuro:  Nonfocal  Psych: Normal affect   Labs    Chemistry Recent Labs Lab 07/27/17 2015 07/28/17 0546  NA 139 141  K 4.4 4.1  CL 106 107  CO2 24 28  GLUCOSE 117* 91  BUN 21* 21*  CREATININE 1.23 1.37*  CALCIUM 9.1 8.8*  PROT 7.3  --   ALBUMIN 3.9  --   AST 43*  --   ALT 37  --   ALKPHOS 78  --   BILITOT  1.2  --   GFRNONAA 52* 46*  GFRAA >60 53*  ANIONGAP 9 6     Hematology Recent Labs Lab 07/27/17 2015  WBC 6.4  RBC 4.20*  HGB 11.2*  HCT 38.0*  MCV 90.5  MCH 26.7  MCHC 29.5*  RDW 19.0*  PLT 220    Cardiac Enzymes Recent Labs Lab 07/27/17 2015  TROPONINI <0.03   No results for input(s): TROPIPOC in the last 168 hours.   BNP Recent Labs Lab 07/27/17 2015  BNP 469.3*     DDimer  Recent Labs Lab 07/27/17 2015  DDIMER 6.31*     Radiology    Dg Chest 2 View  Result Date: 07/27/2017 CLINICAL DATA:  Shortness of breath. EXAM: CHEST  2 VIEW COMPARISON:  Radiographs of January 25, 2017. FINDINGS: Stable cardiomediastinal silhouette. Atherosclerosis of thoracic aorta is noted. Status post aortic valve replacement. Left-sided pacemaker is unchanged in position. No pneumothorax is noted. Mild right pleural effusion is noted with associated atelectasis or pneumonia. Minimal left basilar subsegmental atelectasis or scarring is noted. Minimal left pleural effusion is noted. IMPRESSION: Aortic atherosclerosis. Mild right pleural effusion is noted with associated atelectasis or pneumonia. Minimal left basilar  subsegmental atelectasis or scarring is noted with minimal left pleural effusion. Electronically Signed   By: Lupita Raider, M.D.   On: 07/27/2017 14:28    Cardiac Studies   Echo TEE 11-14-2014: Study Conclusions  - Left ventricle: The cavity size was normal. Wall thickness was normal. Systolic function was normal. The estimated ejection fraction was in the range of 55% to 60%. No evidence of thrombus. - Aortic valve: A bioprosthesis was present and functioning normally. No evidence of vegetation. There was trivial regurgitation. - Mitral valve: No evidence of vegetation. There was mild regurgitation. - Left atrium: No evidence of thrombus in the atrial cavity or appendage. No evidence of thrombus in the appendage. - Right atrium: No evidence of thrombus in the atrial cavity or appendage. - Atrial septum: No defect or patent foramen ovale was identified. - Tricuspid valve: No evidence of vegetation. - Pulmonic valve: No evidence of vegetation.  Impressions:  - Successful cardioversion. No cardiac source of emboli was indentified.  Patient Profile     83 y.o. male with acute on chronic HFpEF, permanent atrial fibrillation, pacemaker, not on anticoagulation due to falls/SDH, HTN, CAD, improving with diuresis.  Assessment & Plan    1. CHF: still above "dry weight", but may need to slow down diuresis due to low BP and renal dysfunction. Breathing still not back to baseline. Needs monitored diuresis due to hemodynamic instability and deteriorating renal function. 2. AFib: slow response, mostly V paced. On amiodarone only due to low BP. 3. CAD: angina free 4. Hypotension:  BP lower today, but asymptomatic at rest. 5. Prerenal azotemia: change to PO diuretics.  For questions or updates, please contact [facility] Please consult [email or web address] for contact info under Cardiology/STEMI.      Signed, Thurmon Fair, MD  07/28/2017, 10:13 AM

## 2017-07-28 NOTE — Progress Notes (Signed)
Patient direct admit to unit 3 east bed 14.Patient assisted to bed by nursing staff.Patient placed in hospital gown and placed on telemetry.Alert and oriented x 4 denies chest pain at present time does complain of shortness of breath with activity.Oriented patient and family to nursing unit,call bell, and fall risk policy.Yellow bracelet,yellow socks and bed alarm set for safety.No acute distress at present time.Will continue to monitor patient.

## 2017-07-28 NOTE — Progress Notes (Signed)
Patient d- dimer 6.31notified Dr.Quresti.No new orders received.Will continue to monitor patient.

## 2017-07-29 ENCOUNTER — Telehealth: Payer: Self-pay | Admitting: Physician Assistant

## 2017-07-29 DIAGNOSIS — J42 Unspecified chronic bronchitis: Secondary | ICD-10-CM | POA: Diagnosis not present

## 2017-07-29 DIAGNOSIS — Z950 Presence of cardiac pacemaker: Secondary | ICD-10-CM

## 2017-07-29 DIAGNOSIS — I4891 Unspecified atrial fibrillation: Secondary | ICD-10-CM | POA: Diagnosis not present

## 2017-07-29 DIAGNOSIS — I5033 Acute on chronic diastolic (congestive) heart failure: Secondary | ICD-10-CM | POA: Diagnosis not present

## 2017-07-29 DIAGNOSIS — I110 Hypertensive heart disease with heart failure: Secondary | ICD-10-CM | POA: Diagnosis not present

## 2017-07-29 DIAGNOSIS — I481 Persistent atrial fibrillation: Secondary | ICD-10-CM | POA: Diagnosis not present

## 2017-07-29 LAB — BASIC METABOLIC PANEL
Anion gap: 9 (ref 5–15)
BUN: 22 mg/dL — ABNORMAL HIGH (ref 6–20)
CHLORIDE: 102 mmol/L (ref 101–111)
CO2: 30 mmol/L (ref 22–32)
CREATININE: 1.33 mg/dL — AB (ref 0.61–1.24)
Calcium: 8.7 mg/dL — ABNORMAL LOW (ref 8.9–10.3)
GFR calc non Af Amer: 47 mL/min — ABNORMAL LOW (ref >60)
GFR, EST AFRICAN AMERICAN: 55 mL/min — AB (ref >60)
Glucose, Bld: 84 mg/dL (ref 65–99)
Potassium: 4.2 mmol/L (ref 3.5–5.1)
SODIUM: 141 mmol/L (ref 135–145)

## 2017-07-29 MED ORDER — AMIODARONE HCL 200 MG PO TABS: 200.0000 mg | ORAL_TABLET | Freq: Every day | ORAL | 3 refills | Status: DC

## 2017-07-29 MED ORDER — FUROSEMIDE 40 MG PO TABS: 40.0000 mg | ORAL_TABLET | Freq: Every day | ORAL | Status: DC

## 2017-07-29 MED ORDER — POTASSIUM CHLORIDE CRYS ER 10 MEQ PO TBCR: 10.0000 meq | EXTENDED_RELEASE_TABLET | Freq: Every day | ORAL | 3 refills | Status: DC

## 2017-07-29 MED ORDER — AMIODARONE HCL 200 MG PO TABS: 200.0000 mg | ORAL_TABLET | Freq: Every day | ORAL | Status: DC

## 2017-07-29 MED ORDER — FUROSEMIDE 40 MG PO TABS: 40.0000 mg | ORAL_TABLET | Freq: Every day | ORAL | 3 refills | Status: DC

## 2017-07-29 NOTE — Progress Notes (Signed)
Progress Note  Patient Name: Alex [name] Date of Encounter: 07/29/2017  Primary Cardiologist: Cooper/ Weaver  Subjective   Weight down very close to presumed dry weight (236 lb today, 232-235 lb most of last 12 months). No dyspnea walking in room, without O2. Net diuresis 3.5 L since admission.  Inpatient Medications    Scheduled Meds: . ALPRAZolam  0.25 mg Oral QHS  . amiodarone  200 mg Oral BID  . aspirin  81 mg Oral Daily  . atorvastatin  80 mg Oral Daily  . enoxaparin (LOVENOX) injection  40 mg Subcutaneous Q24H  . escitalopram  5 mg Oral QHS  . folic acid  1 mg Oral Daily  . furosemide  40 mg Oral BID  . lactulose  20 g Oral Daily  . levETIRAcetam  500 mg Oral BID  . LORazepam  0-4 mg Oral Q6H   Followed by  . LORazepam  0-4 mg Oral Q12H  . magnesium oxide  200 mg Oral Daily  . multivitamin with minerals  1 tablet Oral Daily  . potassium chloride  10 mEq Oral BID  . sodium chloride flush  3 mL Intravenous Q12H  . thiamine  100 mg Oral Daily  . traZODone  50 mg Oral QHS   Continuous Infusions: . sodium chloride     PRN Meds: sodium chloride, acetaminophen, albuterol, LORazepam **OR** LORazepam, ondansetron (ZOFRAN) IV, sodium chloride flush   Vital Signs    Vitals:   07/28/17 1545 07/28/17 2027 07/29/17 0623 07/29/17 0826  BP: (!) 87/49 135/62 117/65   Pulse: (!) 120 84 69   Resp: Temp: 97.7 F (36.5 C) 97.8 F (36.6 C) 97.8 F (36.6 C)   TempSrc: Axillary Oral Oral   SpO2: 92% 94% 94%   Weight:   263 lb (119.3 kg) 236 lb 15.9 oz (107.5 kg)  Height:        Intake/Output Summary (Last 24 hours) at 07/29/17 0850 Last data filed at 07/29/17 1610  Gross per 24 hour  Intake              415 ml  Output             2850 ml  Net            -2435 ml   Filed Weights   07/28/17 0355 07/29/17 0623 07/29/17 0826  Weight: 241 lb 3.2 oz (109.4 kg) 263 lb (119.3 kg) 236 lb 15.9 oz (107.5 kg)    Telemetry    AFib, mostly V paced - Personally  Reviewed  ECG    No new tracing - Personally Reviewed  Physical Exam  Smiling, comfortable GEN: No acute distress.   Neck: No JVD Cardiac: RRR - intermittently irregular, no murmurs, rubs, or gallops.  Respiratory: Clear to auscultation bilaterally. GI: Soft, nontender, non-distended  MS: No edema; No deformity. Neuro:  Nonfocal  Psych: Normal affect   Labs    Chemistry Recent Labs Lab 07/27/17 2015 07/28/17 0546 07/29/17 0355  NA 139 141 141  K 4.4 4.1 4.2  CL 106 107 102  CO2 GLUCOSE 117* 91 84  BUN 21* 21* 22*  CREATININE 1.23 1.37* 1.33*  CALCIUM 9.1 8.8* 8.7*  PROT 7.3  --   --   ALBUMIN 3.9  --   --   AST 43*  --   --   ALT 37  --   --   ALKPHOS 78  --   --  BILITOT 1.2  --   --   GFRNONAA 52* 46* 47*  GFRAA >60 53* 55*  ANIONGAP $Remo ematology Recent Labs Lab 07/27/17 2015  WBC 6.4  RBC 4.20*  HGB 11.2*  HCT 38.0*  MCV 90.5  MCH 26.7  MCHC 29.5*  RDW 19.0*  PLT 220    Cardiac Enzymes Recent Labs Lab 07/27/17 2015  TROPONINI <0.03   No results for input(s): TROPIPOC in the last 168 hours.   BNP Recent Labs Lab 07/27/17 2015  BNP 469.3*     DDimer  Recent Labs Lab 07/27/17 2015  DDIMER 6.31*     Radiology    Dg Chest 2 View  Result Date: 07/27/2017 CLINICAL DATA:  Shortness of breath. EXAM: CHEST  2 VIEW COMPARISON:  Radiographs of January 25, 2017. FINDINGS: Stable cardiomediastinal silhouette. Atherosclerosis of thoracic aorta is noted. Status post aortic valve replacement. Left-sided pacemaker is unchanged in position. No pneumothorax is noted. Mild right pleural effusion is noted with associated atelectasis or pneumonia. Minimal left basilar subsegmental atelectasis or scarring is noted. Minimal left pleural effusion is noted. IMPRESSION: Aortic atherosclerosis. Mild right pleural effusion is noted with associated atelectasis or pneumonia. Minimal left basilar subsegmental atelectasis or scarring is noted with  minimal left pleural effusion. Electronically Signed   By: Lupita Raider, M.D.   On: 07/27/2017 14:28    Cardiac Studies   ECHO 07/28/2017  - Procedure narrative: Transthoracic echocardiography. Image   quality was poor. The study was technically difficult, as a   result of poor acoustic windows, poor sound wave transmission,   and body habitus. - Left ventricle: The cavity size was normal. Wall thickness was   increased in a pattern of moderate LVH. Systolic function was   normal. The estimated ejection fraction was in the range of 50%   to 55%. Wall motion was normal; there were no regional wall   motion abnormalities. - Aortic valve: Mildly calcified annulus. ..... (bioprosthesis) - Mitral valve: Mildly calcified annulus. Mildly thickened, mildly   calcified leaflets . Valve area by continuity equation (using   LVOT flow): 1.12 cm^2. - Right ventricle: Systolic function was mildly reduced. - Right atrium: The atrium was mildly dilated. - Pulmonary arteries: Systolic pressure was moderately increased.   PA peak pressure: 51 mm Hg (S).  Patient Profile     83 y.o. male with acute on chronic HFpEF, permanent atrial fibrillation, s/p bioAVR, pacemaker, not on anticoagulation due to falls/SDH, HTN, CAD, improved with diuresis.  Assessment & Plan    1. CHF: just above "dry weight", will DC home today on daily furosemide 40 mg PO diuretic (was only taking 20 mg three days a week). Recommend early TOC f/u visit next week. Reviewed sodium restriction and daily weights. 2. AFib: slow response, mostly V paced. On amiodarone only due to low BP. Should review amiodarone dosage with Dr. Johney Frame at next device check - was paced 40% at January pacemaker check. Seems to be well over 80% paced on monitor here.  3. CAD: asymptomatic. 4. Hypotension:  BP occasionally low (which I believe was the reason for lower dose diuretic), but asymptomatic . 5. Prerenal azotemia: stable after change to PO  diuretics. 6. S/p AVR: bioprosthesis gradients not reported on echo, but normal on my review of Doppler images 7. PM: last device check January. Will recheck before DC today. Need to get him doing remote downloads again.  For questions or updates, please contact  [facility] Please consult [email or web address] for contact info under Cardiology/STEMI.      Signed, Thurmon Fair, MD  07/29/2017, 8:50 AM

## 2017-07-29 NOTE — Discharge Summary (Signed)
Discharge Summary    Patient ID: Alex [name],  MRN: [id number], DOB/AGE: 1931/10/15 83 y.o.  Admit date: 07/27/2017 Discharge date: 07/29/2017  Primary Care Provider: [name] Primary Cardiologist: Dr. Excell Seltzer  Discharge Diagnoses    Principal Problem:   Acute on chronic diastolic (congestive) heart failure [facility]) Active Problems:   Chronic obstructive pulmonary disease (HCC)   Alcohol use   Coronary artery disease involving coronary bypass graft of native heart without angina pectoris   History of Present Illness     Alex [name] is a 83 y.o. male with past medical history of CAD (s/p CABG in 2012 with LIMA-LAD and seq SVG-3rdOM-4thOM, patent grafts by cath in 2014), severe AS (s/p AVR in 2012), CHB (s/p Medtronic PPM placement - followed by Dr. Johney Frame), PAF (not on anticoagulation due to history of subdural hematoma), HTN, HLD and history of subdural hematoma who was directly admitted to [facility] from the office on 07/27/2017.   He reported worsening dyspnea on exertion along with orthopnea, abdominal distension, and a decreased appetite. BNP was found to be elevated at 469. Admission was recommended and he was started on IV Lasix at that time.   Hospital Course     Consultants: None  The following morning, he reported improvement in his respiratory status. Weight was down 5 lbs but with the up-trending of his creatinine and hypotension, he was switched to PO Lasix. An echocardiogram was obtained and showed a preserved EF of 50-55%, no regional WMA, and moderately elevated PA pressures with a peak pressure of 51 mm Hg.   On 07/29/2017, weight had trended down to 236 lbs (at 246 lbs on admission) and he was overall -3.5L. Was ambulating around the room without difficulty. Creatinine was stable at 1.33, therefore he was continued on Lasix  daily (previously only taking  MWF prior to admission). Amiodarone dosing was reduced from  BID to  daily. His device  was interrogated prior to discharge and a staff message has been sent to the device clinic to arrange for remote device checks as his last interrogation was in 10/2016.  He was last examined by Dr. Royann Shivers and deemed stable for discharge. A 10-day TOC appointment has been arranged with [name], PA-C. He will need a repeat BMET at that time.  He was discharged home in good condition.   _____________  Discharge Vitals Blood pressure 117/65, pulse 69, temperature 97.8 F (36.6 C), temperature source Oral, resp. rate 18, height  (1.854 m), weight 236 lb 15.9 oz (107.5 kg), SpO2 94 %.  Filed Weights   07/28/17 0355 07/29/17 0623 07/29/17 0826  Weight: 241 lb 3.2 oz (109.4 kg) 263 lb (119.3 kg) 236 lb 15.9 oz (107.5 kg)    Labs & Radiologic Studies     CBC  Recent Labs  07/27/17 2015  WBC 6.4  NEUTROABS 4.2  HGB 11.2*  HCT 38.0*  MCV 90.5  PLT 220   Basic Metabolic Panel  Recent Labs  07/28/17 0546 07/29/17 0355  NA 141 141  K 4.1 4.2  CL 107 102  CO2 28 30  GLUCOSE 91 84  BUN 21* 22*  CREATININE 1.37* 1.33*  CALCIUM 8.8* 8.7*   Liver Function Tests  Recent Labs  07/27/17 2015  AST 43*  ALT 37  ALKPHOS 78  BILITOT 1.2  PROT 7.3  ALBUMIN 3.9   No results for input(s): LIPASE, AMYLASE in the last 72 hours. Cardiac Enzymes  Recent Labs  07/27/17 2015  TROPONINI <0.03   BNP Invalid input(s): POCBNP D-Dimer  Recent Labs  07/27/17 2015  DDIMER 6.31*   Hemoglobin A1C No results for input(s): HGBA1C in the last 72 hours. Fasting Lipid Panel No results for input(s): CHOL, HDL, LDLCALC, TRIG, CHOLHDL, LDLDIRECT in the last 72 hours. Thyroid Function Tests  Recent Labs  07/27/17 2015  TSH 2.098    Dg Chest 2 View  Result Date: 07/27/2017 CLINICAL DATA:  Shortness of breath. EXAM: CHEST  2 VIEW COMPARISON:  Radiographs of January 25, 2017. FINDINGS: Stable cardiomediastinal silhouette. Atherosclerosis of thoracic aorta is noted. Status post  aortic valve replacement. Left-sided pacemaker is unchanged in position. No pneumothorax is noted. Mild right pleural effusion is noted with associated atelectasis or pneumonia. Minimal left basilar subsegmental atelectasis or scarring is noted. Minimal left pleural effusion is noted. IMPRESSION: Aortic atherosclerosis. Mild right pleural effusion is noted with associated atelectasis or pneumonia. Minimal left basilar subsegmental atelectasis or scarring is noted with minimal left pleural effusion. Electronically Signed   By: Lupita Raider, M.D.   On: 07/27/2017 14:28   Ct Head Wo Contrast  Result Date: 07/23/2017 [facility] NEUROIMAGING REPORT STUDY DATE: 07/21/17 PATIENT NAME: VELMER WOELFEL DOB: 03-Jun-1931 MRN: [id number] ORDERING CLINICIAN: Levert Feinstein, MD PhD CLINICAL HISTORY: 83 year old male with memory loss. History of subdural hematoma s/p craniotomy. EXAM: CT head (without) TECHNIQUE: CT scan of the head was obtained utilizing 5 mm axial slices from the skull base to the vertex. CONTRAST: no COMPARISON: 01/21/17 IMAGING SITE: [facility] FINDINGS: The cortical sulci, fissures and cisterns are notable for mild-moderate atrophy. Lateral, third and fourth ventricle are notable for mild ventriculomegaly, with asymmetric enlargement of the right lateral ventricle. Mild hyperdensities and thickening of the dura over the right frontal and insular regions, as well as over the bilateral tentorium cerebelli. No mass effect or mid-line shift. Right craniotomy and post-surgical changes. Right parietal encephalomalcia. Moderate periventricular and subcortical chronic small vessel ischemic disease. No other definite extra-axial fluid collections are seen. No intracranial hemorrhage. The orbits and their contents and paranasal sinuses are unremarkable.   Abnormal CT head (without) demonstrating: 1. Mild hyperdensities and thickening of the dura over the right frontal  and insular regions, as well as over the bilateral tentorium cerebelli, consistent with mild residual chronic subdural hemorrhage. No mass effect or mid-line shift. 2. Right craniotomy and post-surgical changes. 3. Right parietal encephalomalcia. 4. Moderate periventricular and subcortical chronic small vessel ischemic disease. 5. No acute findings. 6. Compared to CT on 01/21/17 there has been reduction in right subdural hemorrhage. INTERPRETING PHYSICIAN: Suanne Marker, MD Certified in Neurology, Neurophysiology and Neuroimaging [facility] [address], [address] [place], [place] [address] [phone]     Diagnostic Studies/Procedures    Echocardiogram: 07/28/2017 Study Conclusions  - Procedure narrative: Transthoracic echocardiography. Image   quality was poor. The study was technically difficult, as a   result of poor acoustic windows, poor sound wave transmission,   and body habitus. - Left ventricle: The cavity size was normal. Wall thickness was   increased in a pattern of moderate LVH. Systolic function was   normal. The estimated ejection fraction was in the range of 50%   to 55%. Wall motion was normal; there were no regional wall   motion abnormalities. - Aortic valve: Mildly calcified annulus. - Mitral valve: Mildly calcified annulus. Mildly thickened, mildly   calcified leaflets . Valve area by continuity  equation (using   LVOT flow): 1.12 cm^2. - Right ventricle: Systolic function was mildly reduced. - Right atrium: The atrium was mildly dilated. - Pulmonary arteries: Systolic pressure was moderately increased.   PA peak pressure: 51 mm Hg (S).    Disposition   Pt is being discharged home today in good condition.  Follow-up Plans & Appointments    Follow-up Information    Beatrice Lecher, PA-C Follow up on 08/07/2017.   Specialties:  Cardiology, Physician Assistant Why:  Cardiology Hospital Follow-Up on 08/07/2017 at 8:45AM.  Contact  information: [address] [place] [address] [phone]          Discharge Instructions    Diet - low sodium heart healthy    Complete by:  As directed    Discharge instructions    Complete by:  As directed    Limit daily fluid intake to less than 2 Liters per day. Please limit salt intake.  Please weight yourself every morning. Call cardiology if weight increases by 3 pounds overnight or if your weight exceeds 239 lbs.   Increase activity slowly    Complete by:  As directed       Discharge Medications     Medication List    TAKE these medications   ALPRAZolam 0.25 MG tablet Commonly known as:  XANAX Take 1 tablet (0.25 mg total) by mouth at bedtime.   amiodarone 200 MG tablet Commonly known as:  PACERONE Take 1 tablet (200 mg total) by mouth daily. What changed:  when to take this   aspirin 81 MG tablet Take 81 mg by mouth daily.   atorvastatin 80 MG tablet Commonly known as:  LIPITOR TAKE 1 TABLET BY MOUTH DAILY What changed:  See the new instructions.   escitalopram 5 MG tablet Commonly known as:  LEXAPRO Take 1 tablet (5 mg total) by mouth at bedtime.   furosemide 40 MG tablet Commonly known as:  LASIX Take 1 tablet (40 mg total) by mouth daily. What changed:  medication strength  how much to take  when to take this   lactulose 10 GM/15ML solution Commonly known as:  CHRONULAC Take 30 mLs (20 g total) by mouth daily.   levETIRAcetam 500 MG tablet Commonly known as:  KEPPRA Take 1 tablet (500 mg total) by mouth 2 (two) times daily.   magnesium oxide 400 (241.3 Mg) MG tablet Commonly known as:  MAG-OX Take 0.5 tablets (200 mg total) by mouth daily.   potassium chloride 10 MEQ tablet Commonly known as:  K-DUR,KLOR-CON Take 1 tablet (10 mEq total) by mouth daily.   PROAIR HFA 108 (90 Base) MCG/ACT inhaler Generic drug:  albuterol Inhale 2 puffs into the lungs every 6 (six) hours as needed. wheezing   traZODone 50  MG tablet Commonly known as:  DESYREL Take 50 mg by mouth at bedtime.          Allergies No Known Allergies   Outstanding Labs/Studies   BMET at Follow-Up Appointment  Duration of Discharge Encounter   Greater than 30 minutes including physician time.  Signed, Ellsworth Lennox, PA-C 07/29/2017, 10:42 AM

## 2017-07-29 NOTE — Progress Notes (Signed)
  Comprehensive pacemaker check with active testing of capture threshold. Roughly 50% ventricular pacing only slightly changed from 40% pacing in January. RV lead has high pacing threshold 3.75 V at 1.5 ms, unipolar. Impedance is also relatively high at 1764 ohms, but these are chronic abnormalities. The lead is a Medtronic [id number] implanted in 2006. He is not pacemaker dependent. The underlying ventricular rate is 40-50 bpm. High ventricular rates are not seen. Will decrease the dose of amiodarone to 200 mg daily.  Thurmon Fair, MD, [facility] [phone] office [phone] pager

## 2017-07-29 NOTE — Care Management Note (Signed)
Case Management Note  Patient Details  Name: Alex [name] MRN: [id number] Date of Birth: 09-Apr-1931  Subjective/Objective:      CHF             Action/Plan: Patient lives at home with spouse; Primary Care Provider: [name]; has private insurance with [facility] [place] with prescription drug coverage; CM following for DCP  Expected Discharge Date:  07/29/17               Expected Discharge Plan:  Home/Self Care  Discharge planning Services  CM Consult    Status of Service:  In process, will continue to follow  Reola Mosher [phone] 07/29/2017, 11:15 AM

## 2017-07-29 NOTE — Telephone Encounter (Signed)
New message     TOC appt made  7-10 days per [place] PA,. Appointment 10/12 :45 with [name].

## 2017-07-30 NOTE — Telephone Encounter (Signed)
Patient contacted regarding discharge from [facility] on 07/29/17.  Patient understands to follow up with provider [name] PA on 08/07/17 at 8:45 am at [facility]. Office.  Patient understands his discharge instructions.  Patient understands his medications and regimen.  Patient understands to bring all of his medications to this visit.  Per wife, patient denies chest pain and dizziness but still has some sob but its much better than his admission to the hospital.

## 2017-08-03 ENCOUNTER — Ambulatory Visit (INDEPENDENT_AMBULATORY_CARE_PROVIDER_SITE_OTHER): Payer: Medicare Other | Admitting: *Deleted

## 2017-08-03 ENCOUNTER — Telehealth: Payer: Self-pay | Admitting: Cardiology

## 2017-08-03 DIAGNOSIS — I495 Sick sinus syndrome: Secondary | ICD-10-CM | POA: Diagnosis not present

## 2017-08-03 NOTE — Telephone Encounter (Signed)
Confirmed remote transmission w/ pt wife.   

## 2017-08-04 LAB — CUP PACEART REMOTE DEVICE CHECK
Battery Remaining Longevity: 96 mo
Date Time Interrogation Session: 20181008194233
Implantable Lead Implant Date: 20061113
Implantable Lead Location: 753859
Implantable Pulse Generator Implant Date: 20170110
Lead Channel Impedance Value: 1713 Ohm
Lead Channel Pacing Threshold Pulse Width: 0.4 ms
MDC IDC LEAD IMPLANT DT: 20061113
MDC IDC LEAD LOCATION: 753860
MDC IDC MSMT BATTERY IMPEDANCE: 134 Ohm
MDC IDC MSMT BATTERY VOLTAGE: 2.78 V
MDC IDC MSMT LEADCHNL RA IMPEDANCE VALUE: 389 Ohm
MDC IDC MSMT LEADCHNL RA PACING THRESHOLD AMPLITUDE: 0.625 V
MDC IDC SET LEADCHNL RA PACING AMPLITUDE: 2 V
MDC IDC SET LEADCHNL RV PACING AMPLITUDE: 4 V
MDC IDC SET LEADCHNL RV PACING PULSEWIDTH: 1.5 ms
MDC IDC SET LEADCHNL RV SENSING SENSITIVITY: 4 mV
MDC IDC STAT BRADY AP VP PERCENT: 1 %
MDC IDC STAT BRADY AP VS PERCENT: 0 %
MDC IDC STAT BRADY AS VP PERCENT: 66 %
MDC IDC STAT BRADY AS VS PERCENT: 33 %

## 2017-08-04 NOTE — Progress Notes (Signed)
Remote pacemaker transmission.   

## 2017-08-07 ENCOUNTER — Encounter: Payer: Self-pay | Admitting: Physician Assistant

## 2017-08-07 ENCOUNTER — Encounter: Payer: Self-pay | Admitting: Cardiology

## 2017-08-07 ENCOUNTER — Inpatient Hospital Stay (HOSPITAL_COMMUNITY)
Admission: AD | Admit: 2017-08-07 | Discharge: 2017-08-10 | DRG: 292 | Disposition: A | Discharge status: Home / Self Care | Payer: Medicare Other | Source: Ambulatory Visit | Attending: Cardiovascular Disease | Admitting: Cardiovascular Disease

## 2017-08-07 ENCOUNTER — Encounter (INDEPENDENT_AMBULATORY_CARE_PROVIDER_SITE_OTHER): Payer: Self-pay

## 2017-08-07 ENCOUNTER — Encounter (HOSPITAL_COMMUNITY): Payer: Self-pay | Admitting: *Deleted

## 2017-08-07 ENCOUNTER — Ambulatory Visit (INDEPENDENT_AMBULATORY_CARE_PROVIDER_SITE_OTHER): Payer: Medicare Other | Admitting: Physician Assistant

## 2017-08-07 VITALS — BP 102/54 | HR 70 | Ht 73.0 in | Wt 244.8 lb

## 2017-08-07 DIAGNOSIS — I482 Chronic atrial fibrillation: Secondary | ICD-10-CM

## 2017-08-07 DIAGNOSIS — J449 Chronic obstructive pulmonary disease, unspecified: Secondary | ICD-10-CM | POA: Diagnosis not present

## 2017-08-07 DIAGNOSIS — Z9889 Other specified postprocedural states: Secondary | ICD-10-CM | POA: Diagnosis not present

## 2017-08-07 DIAGNOSIS — R0902 Hypoxemia: Secondary | ICD-10-CM | POA: Diagnosis not present

## 2017-08-07 DIAGNOSIS — I5030 Unspecified diastolic (congestive) heart failure: Secondary | ICD-10-CM

## 2017-08-07 DIAGNOSIS — I4821 Permanent atrial fibrillation: Secondary | ICD-10-CM

## 2017-08-07 DIAGNOSIS — Z820 Family history of epilepsy and other diseases of the nervous system: Secondary | ICD-10-CM

## 2017-08-07 DIAGNOSIS — Z79899 Other long term (current) drug therapy: Secondary | ICD-10-CM | POA: Diagnosis not present

## 2017-08-07 DIAGNOSIS — Z953 Presence of xenogenic heart valve: Secondary | ICD-10-CM | POA: Diagnosis present

## 2017-08-07 DIAGNOSIS — Z952 Presence of prosthetic heart valve: Secondary | ICD-10-CM

## 2017-08-07 DIAGNOSIS — I5033 Acute on chronic diastolic (congestive) heart failure: Principal | ICD-10-CM | POA: Diagnosis present

## 2017-08-07 DIAGNOSIS — Z96653 Presence of artificial knee joint, bilateral: Secondary | ICD-10-CM | POA: Diagnosis present

## 2017-08-07 DIAGNOSIS — E876 Hypokalemia: Secondary | ICD-10-CM | POA: Diagnosis present

## 2017-08-07 DIAGNOSIS — N179 Acute kidney failure, unspecified: Secondary | ICD-10-CM | POA: Diagnosis not present

## 2017-08-07 DIAGNOSIS — G4733 Obstructive sleep apnea (adult) (pediatric): Secondary | ICD-10-CM | POA: Diagnosis present

## 2017-08-07 DIAGNOSIS — I481 Persistent atrial fibrillation: Secondary | ICD-10-CM | POA: Diagnosis not present

## 2017-08-07 DIAGNOSIS — Z951 Presence of aortocoronary bypass graft: Secondary | ICD-10-CM

## 2017-08-07 DIAGNOSIS — I25810 Atherosclerosis of coronary artery bypass graft(s) without angina pectoris: Secondary | ICD-10-CM

## 2017-08-07 DIAGNOSIS — I2510 Atherosclerotic heart disease of native coronary artery without angina pectoris: Secondary | ICD-10-CM | POA: Diagnosis not present

## 2017-08-07 DIAGNOSIS — T502X5A Adverse effect of carbonic-anhydrase inhibitors, benzothiadiazides and other diuretics, initial encounter: Secondary | ICD-10-CM | POA: Diagnosis present

## 2017-08-07 DIAGNOSIS — Z9049 Acquired absence of other specified parts of digestive tract: Secondary | ICD-10-CM

## 2017-08-07 DIAGNOSIS — Z87891 Personal history of nicotine dependence: Secondary | ICD-10-CM

## 2017-08-07 DIAGNOSIS — Z950 Presence of cardiac pacemaker: Secondary | ICD-10-CM | POA: Diagnosis not present

## 2017-08-07 DIAGNOSIS — Z7982 Long term (current) use of aspirin: Secondary | ICD-10-CM

## 2017-08-07 DIAGNOSIS — Z6829 Body mass index (BMI) 29.0-29.9, adult: Secondary | ICD-10-CM

## 2017-08-07 LAB — CBC WITH DIFFERENTIAL/PLATELET
Basophils Absolute: 0.1 10*3/uL (ref 0.0–0.1)
Basophils Relative: 1 %
Eosinophils Absolute: 0 10*3/uL (ref 0.0–0.7)
Eosinophils Relative: 1 %
HEMATOCRIT: 35 % — AB (ref 39.0–52.0)
Hemoglobin: 10.3 g/dL — ABNORMAL LOW (ref 13.0–17.0)
LYMPHS PCT: 16 %
Lymphs Abs: 0.9 10*3/uL (ref 0.7–4.0)
MCH: 26.5 pg (ref 26.0–34.0)
MCHC: 29.4 g/dL — AB (ref 30.0–36.0)
MCV: 90.2 fL (ref 78.0–100.0)
MONO ABS: 0.5 10*3/uL (ref 0.1–1.0)
MONOS PCT: 9 %
NEUTROS ABS: 4.4 10*3/uL (ref 1.7–7.7)
Neutrophils Relative %: 73 %
Platelets: 213 10*3/uL (ref 150–400)
RBC: 3.88 MIL/uL — ABNORMAL LOW (ref 4.22–5.81)
RDW: 19.2 % — AB (ref 11.5–15.5)
WBC: 6 10*3/uL (ref 4.0–10.5)

## 2017-08-07 LAB — COMPREHENSIVE METABOLIC PANEL
ALT: 29 U/L (ref 17–63)
ANION GAP: 8 (ref 5–15)
AST: 39 U/L (ref 15–41)
Albumin: 3.4 g/dL — ABNORMAL LOW (ref 3.5–5.0)
Alkaline Phosphatase: 79 U/L (ref 38–126)
BILIRUBIN TOTAL: 1.3 mg/dL — AB (ref 0.3–1.2)
BUN: 21 mg/dL — AB (ref 6–20)
CO2: 23 mmol/L (ref 22–32)
Calcium: 8.6 mg/dL — ABNORMAL LOW (ref 8.9–10.3)
Chloride: 109 mmol/L (ref 101–111)
Creatinine, Ser: 1.07 mg/dL (ref 0.61–1.24)
Glucose, Bld: 117 mg/dL — ABNORMAL HIGH (ref 65–99)
POTASSIUM: 4.2 mmol/L (ref 3.5–5.1)
Sodium: 140 mmol/L (ref 135–145)
TOTAL PROTEIN: 6.5 g/dL (ref 6.5–8.1)

## 2017-08-07 LAB — TROPONIN I

## 2017-08-07 LAB — BRAIN NATRIURETIC PEPTIDE: B NATRIURETIC PEPTIDE 5: 351.7 pg/mL — AB (ref 0.0–100.0)

## 2017-08-07 MED ORDER — LACTULOSE 10 GM/15ML PO SOLN
20.0000 g | Freq: Every day | ORAL | Status: DC
  Administered 2017-08-08 – 2017-08-10 (×3): 20 g via ORAL
  Filled 2017-08-07 (×3): qty 30

## 2017-08-07 MED ORDER — FUROSEMIDE 10 MG/ML IJ SOLN
40.0000 mg | Freq: Two times a day (BID) | INTRAMUSCULAR | Status: DC
  Administered 2017-08-07 – 2017-08-08 (×2): 40 mg via INTRAVENOUS
  Filled 2017-08-07 (×2): qty 4

## 2017-08-07 MED ORDER — SODIUM CHLORIDE 0.9% FLUSH
3.0000 mL | Freq: Two times a day (BID) | INTRAVENOUS | Status: DC
  Administered 2017-08-07 – 2017-08-10 (×7): 3 mL via INTRAVENOUS

## 2017-08-07 MED ORDER — ONDANSETRON HCL 4 MG/2ML IJ SOLN: 4.0000 mg | Freq: Four times a day (QID) | INTRAMUSCULAR | Status: DC | PRN

## 2017-08-07 MED ORDER — POTASSIUM CHLORIDE CRYS ER 10 MEQ PO TBCR
10.0000 meq | EXTENDED_RELEASE_TABLET | Freq: Two times a day (BID) | ORAL | Status: DC
  Administered 2017-08-07 – 2017-08-09 (×6): 10 meq via ORAL
  Filled 2017-08-07 (×7): qty 1

## 2017-08-07 MED ORDER — MAGNESIUM OXIDE 400 (241.3 MG) MG PO TABS
200.0000 mg | ORAL_TABLET | Freq: Every day | ORAL | Status: DC
  Administered 2017-08-08 – 2017-08-10 (×3): 200 mg via ORAL
  Filled 2017-08-07 (×3): qty 1

## 2017-08-07 MED ORDER — LEVETIRACETAM 500 MG PO TABS
500.0000 mg | ORAL_TABLET | Freq: Two times a day (BID) | ORAL | Status: DC
  Administered 2017-08-07 – 2017-08-10 (×6): 500 mg via ORAL
  Filled 2017-08-07 (×7): qty 1

## 2017-08-07 MED ORDER — AMIODARONE HCL 200 MG PO TABS
200.0000 mg | ORAL_TABLET | Freq: Every day | ORAL | Status: DC
  Administered 2017-08-08 – 2017-08-10 (×3): 200 mg via ORAL
  Filled 2017-08-07 (×3): qty 1

## 2017-08-07 MED ORDER — SODIUM CHLORIDE 0.9% FLUSH: 3.0000 mL | INTRAVENOUS | Status: DC | PRN

## 2017-08-07 MED ORDER — TRAZODONE HCL 50 MG PO TABS
50.0000 mg | ORAL_TABLET | Freq: Every day | ORAL | Status: DC
  Administered 2017-08-07 – 2017-08-09 (×3): 50 mg via ORAL
  Filled 2017-08-07 (×3): qty 1

## 2017-08-07 MED ORDER — ESCITALOPRAM OXALATE 10 MG PO TABS
5.0000 mg | ORAL_TABLET | Freq: Every day | ORAL | Status: DC
  Administered 2017-08-07 – 2017-08-09 (×3): 5 mg via ORAL
  Filled 2017-08-07 (×4): qty 1

## 2017-08-07 MED ORDER — SODIUM CHLORIDE 0.9 % IV SOLN: 250.0000 mL | INTRAVENOUS | Status: DC | PRN

## 2017-08-07 MED ORDER — ATORVASTATIN CALCIUM 80 MG PO TABS
80.0000 mg | ORAL_TABLET | Freq: Every day | ORAL | Status: DC
  Administered 2017-08-07 – 2017-08-09 (×3): 80 mg via ORAL
  Filled 2017-08-07 (×3): qty 1

## 2017-08-07 MED ORDER — ALBUTEROL SULFATE (2.5 MG/3ML) 0.083% IN NEBU: 3.0000 mL | INHALATION_SOLUTION | Freq: Four times a day (QID) | RESPIRATORY_TRACT | Status: DC | PRN

## 2017-08-07 MED ORDER — ALPRAZOLAM 0.25 MG PO TABS
0.2500 mg | ORAL_TABLET | Freq: Every day | ORAL | Status: DC
  Administered 2017-08-07 – 2017-08-09 (×3): 0.25 mg via ORAL
  Filled 2017-08-07 (×3): qty 1

## 2017-08-07 MED ORDER — ACETAMINOPHEN 325 MG PO TABS: 650.0000 mg | ORAL_TABLET | ORAL | Status: DC | PRN

## 2017-08-07 MED ORDER — ASPIRIN 81 MG PO CHEW
81.0000 mg | CHEWABLE_TABLET | Freq: Every day | ORAL | Status: DC
  Administered 2017-08-08 – 2017-08-10 (×3): 81 mg via ORAL
  Filled 2017-08-07 (×3): qty 1

## 2017-08-07 MED ORDER — ENOXAPARIN SODIUM 40 MG/0.4ML ~~LOC~~ SOLN
40.0000 mg | SUBCUTANEOUS | Status: DC
  Administered 2017-08-07 – 2017-08-09 (×3): 40 mg via SUBCUTANEOUS
  Filled 2017-08-07 (×3): qty 0.4

## 2017-08-07 NOTE — Consult Note (Signed)
Advanced Heart Failure Team Consult Note   Primary Physician: Dr Alben Spittle  Primary Cardiologist: Dr Excell Seltzer    Reason for Consultation: Heart Failure   HPI:    Alex [name] is seen today for evaluation of diastolic heart failure at the request of Dr Excell Seltzer.   Alex [name] is an 83 year old with a history of Afib , S/P Bioprosthetic AVR 2012, CAD,S/P CABG, sick sinus syndrome with pace maker, and subdural hematoma 11/2016. Most recent LHC 2014 with patent grafts.   Admitted earlier this month with volume overload. Diuresed with 40 mg IV lasix twice daily and transitioned lasix 40 mg daily. Discharge weight was 236 pounds.   Today he presented for post hospital follow up with Alex [name]. He reports increased dyspnea on exertion and dyspnea rest. Weight at home had gone up 8 pounds. Says he has been taking 40 mg po lasix daily. Wears 2 liters oxygen at night. Appetite ok. No falls.   ECHO 07/28/2017 Procedure narrative: Transthoracic echocardiography. Image   quality was poor. The study was technically difficult, as a   result of poor acoustic windows, poor sound wave transmission,   and body habitus. - Left ventricle: The cavity size was normal. Wall thickness was   increased in a pattern of moderate LVH. Systolic function was   normal. The estimated ejection fraction was in the range of 50%   to 55%. Wall motion was normal; there were no regional wall   motion abnormalities. - Aortic valve: Mildly calcified annulus. - Mitral valve: Mildly calcified annulus. Mildly thickened, mildly   calcified leaflets . Valve area by continuity equation (using   LVOT flow): 1.12 cm^2. - Right ventricle: Systolic function was mildly reduced. - Right atrium: The atrium was mildly dilated. - Pulmonary arteries: Systolic pressure was moderately increased.   PA peak pressure: 51 mm Hg (S).  Nuclear stress test 03/14/13 Inferoposterior scar with mild peri-infarct ischemia in the distal lateral wall,  EF 48  Cardiac catheterization 03/22/13 LAD ostial 90 LCx occluded, small intermediate branch proximal 80 RCA proximal 50, mid 30-40 LIMA-LAD patent SVG-OM3/OM4 patent   Review of Systems: [y] = yes,  = no   General: Weight gain ; Weight loss ; Anorexia ; Fatigue [ Y]; Fever ; Chills ; Weakness   Cardiac: Chest pain/pressure ; Resting SOB [Y ]; Exertional SOB [Y ]; Orthopnea ; Pedal Edema ; Palpitations ; Syncope ; Presyncope ; Paroxysmal nocturnal dyspnea[ ]   Pulmonary: Cough ; Wheezing[ ] ; Hemoptysis[ ] ; Sputum ; Snoring   GI: Vomiting[ ] ; Dysphagia[ ] ; Melena[ ] ; Hematochezia ; Heartburn[ ] ; Abdominal pain ; Constipation ; Diarrhea ; BRBPR   GU: Hematuria[ ] ; Dysuria ; Nocturia[ ]   Vascular: Pain in legs with walking ; Pain in feet with lying flat ; Non-healing sores ; Stroke ; TIA ; Slurred speech ;  Neuro: Headaches[ ] ; Vertigo[ ] ; Seizures[ ] ; Paresthesias[ ] ;Blurred vision ; Diplopia ; Vision changes   Ortho/Skin: Arthritis ; Joint pain [Y ]; Muscle pain ; Joint swelling ; Back Pain ; Rash   Psych: Depression[ ] ; Anxiety[ ]   Heme: Bleeding problems ; Clotting disorders ; Anemia   Endocrine: Diabetes ; Thyroid dysfunction[ ]   Home Medications Prior to Admission medications   Medication Sig Start  Date End Date Taking? Authorizing Provider  ALPRAZolam (XANAX) 0.25 MG tablet Take 1 tablet (0.25 mg total) by mouth at bedtime. 02/17/17   Angiulli, Mcarthur Rossetti, PA-C  amiodarone (PACERONE) 200 MG tablet Take 1 tablet (200 mg total) by mouth daily. 07/29/17   Strader, Lennart Pall, PA-C  aspirin 81 MG tablet Take 81 mg by mouth daily.    [provider]  atorvastatin (LIPITOR) 80 MG tablet TAKE 1 TABLET BY MOUTH DAILY 07/09/17   Allred, Fayrene Fearing, MD  escitalopram (LEXAPRO) 5 MG tablet Take 1 tablet (5 mg total) by mouth at bedtime. 02/18/17   Angiulli, Mcarthur Rossetti, PA-C    furosemide (LASIX) 40 MG tablet Take 1 tablet (40 mg total) by mouth daily. 07/29/17   Strader, Lennart Pall, PA-C  lactulose (CHRONULAC) 10 GM/15ML solution Take 30 mLs (20 g total) by mouth daily. 02/18/17   Angiulli, Mcarthur Rossetti, PA-C  levETIRAcetam (KEPPRA) 500 MG tablet Take 1 tablet (500 mg total) by mouth 2 (two) times daily. 02/18/17   Angiulli, Mcarthur Rossetti, PA-C  magnesium oxide (MAG-OX) 400 (241.3 Mg) MG tablet Take 0.5 tablets (200 mg total) by mouth daily. 02/18/17   Angiulli, Mcarthur Rossetti, PA-C  potassium chloride (K-DUR,KLOR-CON) 10 MEQ tablet Take 1 tablet (10 mEq total) by mouth daily. 07/29/17   Strader, Lennart Pall, PA-C  PROAIR HFA [phone] Base) MCG/ACT inhaler Inhale 2 puffs into the lungs every 6 (six) hours as needed. wheezing 07/22/17   [provider]  traZODone (DESYREL) 50 MG tablet Take 50 mg by mouth at bedtime. 07/08/17   [provider]    Past Medical History: Past Medical History:  Diagnosis Date  . Anxiety   . Aortic stenosis    s/p AVR by Dr Laneta Simmers  . COPD (chronic obstructive pulmonary disease) (HCC)   . History of coronary artery disease    status post stenting of the marginal circumflex in 12/2003 and again in 2009  . Hyperlipidemia   . Insomnia   . Morbid obesity (HCC)    weight 243 pounds, BMI 31.2kg/m2, BSA 2.36 square meters  . Obstructive sleep apnea    compliant with CPAP  . Persistent atrial fibrillation (HCC)   . Seizure (HCC)   . Traumatic subdural hematoma without loss of consciousness [facility])     Past Surgical History: Past Surgical History:  Procedure Laterality Date  . AORTIC VALVE REPLACEMENT (AVR)/CORONARY ARTERY BYPASS GRAFTING (CABG)   08/05/2011   LIMA to LAD, sequential saphenous vein graft to third and fourth obtuse marginal branches of the circumflex, aortic valve replacement using a 23 mm Edwards pericardial valve  . APPENDECTOMY    . CARDIOVERSION N/A 11/14/2014   Procedure: CARDIOVERSION;  Surgeon: Donato Schultz, MD;   Location: [facility] ENDOSCOPY;  Service: Cardiovascular;  Laterality: N/A;  . CARDIOVERSION N/A 11/20/2016   Procedure: CARDIOVERSION;  Surgeon: Pricilla Riffle, MD;  Location: [facility] ENDOSCOPY;  Service: Cardiovascular;  Laterality: N/A;  . CAROTID ENDARTERECTOMY     Dr Lollie Sails  . CRANIOTOMY Right 12/16/2016   Procedure: CRANIOTOMY HEMATOMA EVACUATION SUBDURAL;  Surgeon: Loura Halt Ditty, MD;  Location: [facility] OR;  Service: Neurosurgery;  Laterality: Right;  . EP IMPLANTABLE DEVICE N/A 11/06/2015   Procedure: PPM Generator Changeout;for sick sinus syndrome with a MDT Adapta L PPM, chronically elevated RV threshold.  . permanent pacemaker     MDT EnRhythm implanted by Dr Lawanda Cousins in [place] for complete heart block with syncope  . REPLACEMENT TOTAL KNEE BILATERAL  2006  . TEE WITHOUT CARDIOVERSION N/A 11/14/2014   Procedure: TRANSESOPHAGEAL ECHOCARDIOGRAM (TEE);  Surgeon: Donato Schultz, MD;  Location: [facility] ENDOSCOPY;  Service: Cardiovascular;  Laterality: N/A;  . TRANSURETHRAL INCISION OF PROSTATE N/A 01/29/2017   Procedure: TRANSURETHRAL UNROOFING OF A  PROSTATE ABSCESS;  Surgeon: Ihor Gully, MD;  Location: [facility] ORS;  Service: Urology;  Laterality: N/A;    Family History: Family History  Problem Relation Age of Onset  . Alzheimer's disease Mother   . Tuberculosis Father   . Other Father        Spinal Meningitis    Social History: Social History   Social History  . Marital status: Married    Spouse name: N/A  . Number of children: 2  . Years of education: 8th grade   Occupational History  . Retired    Social History Main Topics  . Smoking status: Former Smoker    Packs/day: 0.50    Years: 30.00    Types: Cigarettes    Start date: 10/27/1973    Quit date: 10/28/2003  . Smokeless tobacco: Never Used  . Alcohol use 0.0 oz/week     Comment: 3 glasses of wine per evening  . Drug use: No  . Sexual activity: Not Asked   Other Topics Concern  . None   Social History Narrative   Lives  in [place] with spouse.  Owns a Designer, industrial/product.   Right-handed.   1 cup coffee per day.    Allergies:  No Known Allergies  Objective:    Vital Signs:   Temp:  [97.8 F (36.6 C)] 97.8 F (36.6 C) (10/12 1100) Pulse Rate:  [65-73] 65 (10/12 1215) Resp:  [18] 18 (10/12 1100) BP: (118)/(62) 118/62 (10/12 1100) SpO2:  [96 %] 96 % (10/12 1100) Weight:  [240 lb 8 oz (109.1 kg)] 240 lb 8 oz (109.1 kg) (10/12 1100) Last BM Date: 08/06/17  Weight change: Filed Weights   08/07/17 1100  Weight: 240 lb 8 oz (109.1 kg)    Intake/Output:  No intake or output data in the 24 hours ending 08/07/17 1324    Physical Exam    General:  Elderly. No resp difficulty HEENT: normal Neck: supple. JVP to jaw . Carotids 2+ bilat; no bruits. No lymphadenopathy or thyromegaly appreciated. Cor: PMI nondisplaced. Regular rate & rhythm. No rubs, gallops or murmurs. Lungs: clear Abdomen: soft, nontender, distended. No hepatosplenomegaly. No bruits or masses. Good bowel sounds. Extremities: no cyanosis, clubbing, rash, edema Neuro: alert & orientedx3, cranial nerves grossly intact. moves all 4 extremities w/o difficulty. Affect pleasant   Telemetry   A fib 70s  Personally reviewed   EKG    AF with v-pacing 87 Personally reviewed   Labs   Basic Metabolic Panel: No results for input(s): NA, K, CL, CO2, GLUCOSE, BUN, CREATININE, CALCIUM, MG, PHOS in the last 168 hours.  Liver Function Tests: No results for input(s): AST, ALT, ALKPHOS, BILITOT, PROT, ALBUMIN in the last 168 hours. No results for input(s): LIPASE, AMYLASE in the last 168 hours. No results for input(s): AMMONIA in the last 168 hours.  CBC: No results for input(s): WBC, NEUTROABS, HGB, HCT, MCV, PLT in the last 168 hours.  Cardiac Enzymes: No results for input(s): CKTOTAL, CKMB, CKMBINDEX, TROPONINI in the last 168 hours.  BNP: BNP (last 3 results)  Recent Labs  12/03/16 0718 07/27/17 2015  BNP  358.8* 469.3*    ProBNP (last 3 results) No results for input(s): PROBNP in the  last 8760 hours.   CBG: No results for input(s): GLUCAP in the last 168 hours.  Coagulation Studies: No results for input(s): LABPROT, INR in the last 72 hours.   Imaging    No results found.   Medications:     Current Medications: . ALPRAZolam  0.25 mg Oral QHS  . [START ON 08/08/2017] amiodarone  200 mg Oral Daily  . [START ON 08/08/2017] aspirin  81 mg Oral Daily  . atorvastatin  80 mg Oral q1800  . enoxaparin (LOVENOX) injection  40 mg Subcutaneous Q24H  . escitalopram  5 mg Oral QHS  . furosemide  40 mg Intravenous BID  . [START ON 08/08/2017] lactulose  20 g Oral Daily  . levETIRAcetam  500 mg Oral BID  . [START ON 08/08/2017] magnesium oxide  200 mg Oral Daily  . potassium chloride  10 mEq Oral BID  . sodium chloride flush  3 mL Intravenous Q12H  . traZODone  50 mg Oral QHS     Infusions: . sodium chloride         Patient Profile   Alex [name] is an 83 year old with a history of chronic Afib , S/P Bioprosthetic AVR 2012, CAD,S/P CABG, sick sinus syndrome with pace maker, and subdural hematoma 11/2016. Most recent LHC 2014 with patent grafts.   Admitted with volume overload.   Assessment/Plan   1.A/C Diastolic Heart Failure- ECHO EF 50-55%  - Volume over loaded. Agree with IV lasix 40 mg twice a day. Once diuresed will anticipate switching to torsemide once fully diuresed. - BMET pending. 2. CAD - CABG -LHC 2014 with patent grafts.On statin, asa. No S/S ischemia.  3. Chronic A Fib-On amio  daily. ? Stopping as he is in A fib chronically.  - In the past he was on Toprol XL 12.5 mg daily. Stopped due to hypotension.  - No anticoagulation with h/o subdural hematoma. 4. S/P AVR Bioprosthetic 2012  5. H.O Subdural Hematoma 11/2016 6. SSS with PPM   CBC reviewed. Stable.  BNP BMET pending.  CXR pending.   Length of Stay: 0  Tonye Becket, NP  08/07/2017, 1:24  PM  Advanced Heart Failure Team Pager [phone] (M-F; 7a - 4p)  Please contact [facility] for night-coverage after hours (4p -7a ) and weekends on [email or web address]  Patient seen and examined with Tonye Becket, NP. We discussed all aspects of the encounter. I agree with the assessment and plan as stated above.   Alex 83 y/o [name] followed by Dr. Excell Seltzer for CAD, AVR (bioprosthetic), diastolic HF and permanent AF (no AC due to previous SDH) with chronic RV pacing. Re-admitted with recurrent volume overload.    Echo reviewed personally. EF 50% RV mildly HK but not too bad.   Tries to watch his diet but says he does eat a lot of ice. Weighs daily but has not been using sliding scale diuretics. Will diurese with IV lasix. Transition to po torsemide on d/c. Will educate further on use of sliding scale diuretics. Could consider Cardiomems as needed.   Arvilla Meres, MD  4:59 PM

## 2017-08-07 NOTE — Care Management Note (Signed)
Case Management Note  Patient Details  Name: [name] MRN: [id number] Date of Birth: [birth year]  Subjective/Objective: CHF                  Action/Plan: Patient lives at home with spouse; Primary Care Provider:We[place], Cortlan Dolin; has private insurance with [facility] with prescription drug coverage; CM following for DCP/ possibly need HHC services at discharge/ readmission 3 times in 6 month.  Expected Discharge Date:  08/10/17               Expected Discharge Plan:  Home w Home Health Services  Discharge planning Services  CM Consult  Status of Service:  In process, will continue to follow  Reola Mosher [phone] 08/07/2017, 1:50 PM

## 2017-08-07 NOTE — Patient Instructions (Signed)
YOU ARE BEING ADMITTED TO [facility] TODAY ; THE HOSPITAL WILL CALL YOU WHEN THEY HAVE A ROOM READY.

## 2017-08-07 NOTE — Progress Notes (Signed)
Cardiology Office Note:    Date:  08/07/2017   ID:  Vedia Coffer, DOB Feb 20, 1931, MRN [id number]  PCP:  Derrill Center., MD  Cardiologist:  Dr. Sherren Mocha   Electrophysiologist:  Dr. Thompson Grayer      Referring MD: Derrill Center., MD   Chief Complaint  Patient presents with  . Hospitalization Follow-up    Congestive heart failure    History of Present Illness:    Alex [name] is a 83 y.o. male with a hx of diastolic heart failure, atrial fibrillation, aortic valve disease status post bioprosthetic AVR in 2012, CAD status post CABG.  Cardiac catheterization 2014 demonstrated patent bypass grafts.  He suffered a subdural hematoma in 2/18 with prolonged hospitalization and rehab stay.  He was evaluated by Dr. Burt Knack 07/27/17.  He was admitted from the office for acute on chronic diastolic heart failure.  He was admitted 10/1-10/3.  Echocardiogram demonstrated EF 50-55 with well functioning aortic valve prosthesis and PASP 51.  Amiodarone dose was reduced from 200 mg twice daily to 200 mg daily.  [name] returns for follow-up.  He is here with his wife.  He felt better for 1 day.  However, since then, he notes significant shortness of breath.  He notes dyspnea with minimal activities.  He denies orthopnea or PND.  He denies LE edema.  He does note increasing abdominal girth.  By our scales, his weight is up 8 pounds since discharge.  Prior CV studies:   The following studies were reviewed today:  Echo 07/28/17 Moderate LVH, EF 50-55, normal wall motion, calcified aortic valve, MAC, mildly reduced RVSF, mild RAE, PASP 51  TEE 11/14/14 EF 55-60, aortic valve prosthesis functioning normally, mild MR, no LAA clot  Echo 11/06/14 Mild LVH, EF 55, bioprosthetic aortic valve functioning normally (mean 11), MAC, trivial MR, mild LAE, mild RAE, PASP 36  Nuclear stress test 03/14/13 Inferoposterior scar with mild peri-infarct ischemia in the distal lateral wall, EF 48  Cardiac  catheterization 03/22/13 LAD ostial 90 LCx occluded, small intermediate branch proximal 80 RCA proximal 50, mid 30-40 LIMA-LAD patent SVG-OM3/OM4 patent  Past Medical History:  Diagnosis Date  . Anxiety   . Aortic stenosis    s/p AVR by Dr Cyndia Bent  . COPD (chronic obstructive pulmonary disease) ([place])   . History of coronary artery disease    status post stenting of the marginal circumflex in 12/2003 and again in 2009  . Hyperlipidemia   . Insomnia   . Morbid obesity (HCC)    weight 243 pounds, BMI 31.2kg/m2, BSA 2.36 square meters  . Obstructive sleep apnea    compliant with CPAP  . Persistent atrial fibrillation ([place])   . Seizure ([place])   . Traumatic subdural hematoma without loss of consciousness [facility])     Past Surgical History:  Procedure Laterality Date  . AORTIC VALVE REPLACEMENT (AVR)/CORONARY ARTERY BYPASS GRAFTING (CABG)   08/05/2011   LIMA to LAD, sequential saphenous vein graft to third and fourth obtuse marginal branches of the circumflex, aortic valve replacement using a 23 mm Edwards pericardial valve  . APPENDECTOMY    . CARDIOVERSION N/A 11/14/2014   Procedure: CARDIOVERSION;  Surgeon: Candee Furbish, MD;  Location: [facility] ENDOSCOPY;  Service: Cardiovascular;  Laterality: N/A;  . CARDIOVERSION N/A 11/20/2016   Procedure: CARDIOVERSION;  Surgeon: Fay Records, MD;  Location: [place];  Service: Cardiovascular;  Laterality: N/A;  . CAROTID ENDARTERECTOMY     Dr Levi Aland  . CRANIOTOMY  Right 12/16/2016   Procedure: CRANIOTOMY HEMATOMA EVACUATION SUBDURAL;  Surgeon: Kevan Ny Ditty, MD;  Location: [place];  Service: Neurosurgery;  Laterality: Right;  . EP IMPLANTABLE DEVICE N/A 11/06/2015   Procedure: PPM Generator Changeout;for sick sinus syndrome with a MDT Adapta L PPM, chronically elevated RV threshold.  . permanent pacemaker     MDT EnRhythm implanted by Dr Sherilyn Banker in [place] for complete heart block with syncope  . REPLACEMENT TOTAL KNEE BILATERAL     2006    . TEE WITHOUT CARDIOVERSION N/A 11/14/2014   Procedure: TRANSESOPHAGEAL ECHOCARDIOGRAM (TEE);  Surgeon: Candee Furbish, MD;  Location: [facility] ENDOSCOPY;  Service: Cardiovascular;  Laterality: N/A;  . TRANSURETHRAL INCISION OF PROSTATE N/A 01/29/2017   Procedure: TRANSURETHRAL UNROOFING OF A  PROSTATE ABSCESS;  Surgeon: Kathie Rhodes, MD;  Location: [facility] ORS;  Service: Urology;  Laterality: N/A;    Current Medications: Current Meds  Medication Sig  . ALPRAZolam (XANAX) 0.25 MG tablet Take 1 tablet (0.25 mg total) by mouth at bedtime.  Marland Kitchen amiodarone (PACERONE) 200 MG tablet Take 1 tablet (200 mg total) by mouth daily.  Marland Kitchen aspirin 81 MG tablet Take 81 mg by mouth daily.  Marland Kitchen atorvastatin (LIPITOR) 80 MG tablet TAKE 1 TABLET BY MOUTH DAILY  . escitalopram (LEXAPRO) 5 MG tablet Take 1 tablet (5 mg total) by mouth at bedtime.  . furosemide (LASIX) 40 MG tablet Take 1 tablet (40 mg total) by mouth daily.  Marland Kitchen lactulose (CHRONULAC) 10 GM/15ML solution Take 30 mLs (20 g total) by mouth daily.  Marland Kitchen levETIRAcetam (KEPPRA) 500 MG tablet Take 1 tablet (500 mg total) by mouth 2 (two) times daily.  . magnesium oxide (MAG-OX) 400 (241.3 Mg) MG tablet Take 0.5 tablets (200 mg total) by mouth daily.  . potassium chloride (K-DUR,KLOR-CON) 10 MEQ tablet Take 1 tablet (10 mEq total) by mouth daily.  Marland Kitchen PROAIR HFA 108 (90 Base) MCG/ACT inhaler Inhale 2 puffs into the lungs every 6 (six) hours as needed. wheezing  . traZODone (DESYREL) 50 MG tablet Take 50 mg by mouth at bedtime.     Allergies:   Patient has no known allergies.   Social History   Social History  . Marital status: Married    Spouse name: N/A  . Number of children: 2  . Years of education: 8th grade   Occupational History  . Retired    Social History Main Topics  . Smoking status: Former Smoker    Packs/day: 0.50    Years: 30.00    Types: Cigarettes    Start date: 10/27/1973    Quit date: 10/28/2003  . Smokeless tobacco: Never Used  . Alcohol use 0.0  oz/week     Comment: 3 glasses of wine per evening  . Drug use: No  . Sexual activity: Not Asked   Other Topics Concern  . None   Social History Narrative   Lives in [place] with spouse.  Owns a Scientist, research (medical).   Right-handed.   1 cup coffee per day.     Family Hx: The patient's family history includes Alzheimer's disease in his mother; Other in his father; Tuberculosis in his father.  ROS:   Please see the history of present illness.    ROS All other systems reviewed and are negative.   EKGs/Labs/Other Test Reviewed:    EKG:  EKG is  ordered today.  The ekg ordered today demonstrates V paced HR 87  Recent Labs: 12/07/2016: Magnesium 1.9 07/27/2017: ALT 37;  B Natriuretic Peptide 469.3; Hemoglobin 11.2; Platelets 220; TSH 2.098 07/29/2017: BUN 22; Creatinine, Ser 1.33; Potassium 4.2; Sodium 141   Recent Lipid Panel No results found for: CHOL, TRIG, HDL, CHOLHDL, LDLCALC, LDLDIRECT  Physical Exam:    VS:  BP (!) 102/54   Pulse 70   Ht _0  (1.854 m)   Wt 244 lb 12.8 oz (111 kg)   SpO2 (!) 87%   BMI 32.30 kg/m     Wt Readings from Last 3 Encounters:  08/07/17 240 lb 8 oz (109.1 kg)  08/07/17 244 lb 12.8 oz (111 kg)  07/29/17 236 lb 15.9 oz (107.5 kg)     Physical Exam  Constitutional: He is oriented to person, place, and time. He appears well-developed and well-nourished. No distress.  HENT:  Head: Normocephalic and atraumatic.  Eyes: No scleral icterus.  Neck: JVD present.  Cardiovascular: Normal rate and regular rhythm.   Murmur heard.  Low-pitched systolic murmur is present with a grade of 2/6  at the upper right sternal border, upper left sternal border Pulmonary/Chest: He has decreased breath sounds. He has no wheezes. He has no rales.  Abdominal: He exhibits distension.  Musculoskeletal: He exhibits no edema.  Neurological: He is alert and oriented to person, place, and time.  Skin: Skin is warm and dry.  Psychiatric: He has a normal  mood and affect.    ASSESSMENT:    1. Acute on chronic diastolic heart failure ([place])   2. Permanent atrial fibrillation ([place])   3. Coronary artery disease involving coronary bypass graft of native heart without angina pectoris   4. S/P AVR (aortic valve replacement)   5. Chronic obstructive pulmonary disease, unspecified COPD type ([place])    PLAN:    In order of problems listed above:  Acute on chronic diastolic heart failure ([place]) He returns for follow up and is volume overloaded again.  He is hypoxic and short of breath just sitting up from a lying position in the room. No rales on exam.  Question if this is cor pulmonale rather than diastolic HF.  I have recommended admission to the hospital.  I d/w Dr. Sherren Mocha today.  He agreed with admission to the hospital.   -  Admit to Tele  -  Lasix 40 mg IV bid  -  Consider CHF Team consult  -  May need R heart cath  -  We should place him on Torsemide at DC to help with diuresis   Permanent Atrial Fibrillation He is V paced.  He is not on anticoagulation due to prior subdural hematoma.   3. Coronary artery disease involving coronary bypass graft of native heart without angina pectoris S/p CABG.  He had patent grafts in 2014.  He denies anginal symptoms.    4. S/P AVR (aortic valve replacement) Recent echo with stable AVR.  Continue SBE prophylaxis.    5. COPD This is likely impacting his breathing as well.  Dispo:  Return for [facility] Follow Up, w/ Dr. Burt Knack, or Richardson Dopp, PA-C.   Medication Adjustments/Labs and Tests Ordered: Current medicines are reviewed at length with the patient today.  Concerns regarding medicines are outlined above.  Tests Ordered: Orders Placed This Encounter  Procedures  . EKG 12-Lead   Medication Changes: No orders of the defined types were placed in this encounter.   Signed, Richardson Dopp, PA-C  08/07/2017 12:53 PM    [place], [place]  [address] Phone: [phone]; Fax: [phone]    

## 2017-08-07 NOTE — H&P (Signed)
Cardiology Admission History and Physical:   Patient ID: Alex [name]; MRN: [id number]; DOB: Apr 24, 1931   Admission date: 08/07/2017  Primary Care Provider: Derrill Center., MD Primary Cardiologist: Dr. Sherren [name]   Chief Complaint:  shortness of breath   Patient Profile:   Alex [name] is a 83 y.o. male with a history of diastolic heart failure, atrial fibrillation, aortic valve disease status post bioprosthetic AVR in 2012, CAD status post CABG, sick sinus syndrome status post pacemaker.  Cardiac catheterization 2014 demonstrated patent bypass grafts.  He suffered a subdural hematoma in 2/18 with prolonged hospitalization and rehab stay.  He was recently admitted with acute on chronic diastolic CHF.  He did well with diuresis but developed worsening shortness of breath within 24 hours of discharge.  He returned to the office today for posthospitalization follow-up.  He is volume overloaded and is hypoxemic on exam.  He will be admitted for recurrent acute on chronic diastolic heart failure.   History of Present Illness:   Alex [name] was admitted 10/1-10/3.  Echocardiogram demonstrated EF 50-55 with well functioning aortic valve prosthesis and PASP 51.  Pacemaker was interrogated.  Rate was fairly well controlled.  Amiodarone dose was reduced from 200 mg twice daily to 200 mg daily.  He returns for posthospitalization follow-up.  He felt better for 1 day.  However, since then, he notes significant shortness of breath.  He notes dyspnea with minimal activities.  He denies orthopnea or PND.  He denies LE edema.  He does note increasing abdominal girth.  By our scales, his weight is up 8 pounds since discharge.   Past Medical History:  Diagnosis Date  . Anxiety   . Aortic stenosis    s/p AVR by Dr Alex [name]  . COPD (chronic obstructive pulmonary disease) ([place])   . History of coronary artery disease    status post stenting of the marginal circumflex in 12/2003 and again in 2009  .  Hyperlipidemia   . Insomnia   . Morbid obesity (HCC)    weight 243 pounds, BMI 31.2kg/m2, BSA 2.36 square meters  . Obstructive sleep apnea    compliant with CPAP  . Persistent atrial fibrillation ([place])   . Seizure ([place])   . Traumatic subdural hematoma without loss of consciousness [facility])     Past Surgical History:  Procedure Laterality Date  . AORTIC VALVE REPLACEMENT (AVR)/CORONARY ARTERY BYPASS GRAFTING (CABG)   08/05/2011   LIMA to LAD, sequential saphenous vein graft to third and fourth obtuse marginal branches of the circumflex, aortic valve replacement using a 23 mm Edwards pericardial valve  . APPENDECTOMY    . CARDIOVERSION N/A 11/14/2014   Procedure: CARDIOVERSION;  Surgeon: Candee Furbish, MD;  Location: [facility] ENDOSCOPY;  Service: Cardiovascular;  Laterality: N/A;  . CARDIOVERSION N/A 11/20/2016   Procedure: CARDIOVERSION;  Surgeon: Alex Records, MD;  Location: [place];  Service: Cardiovascular;  Laterality: N/A;  . CAROTID ENDARTERECTOMY     Dr Alex [name]  . CRANIOTOMY Right 12/16/2016   Procedure: CRANIOTOMY HEMATOMA EVACUATION SUBDURAL;  Surgeon: Alex Ny Ditty, MD;  Location: [place];  Service: Neurosurgery;  Laterality: Right;  . EP IMPLANTABLE DEVICE N/A 11/06/2015   Procedure: PPM Generator Changeout;for sick sinus syndrome with a MDT Adapta L PPM, chronically elevated RV threshold.  . permanent pacemaker     MDT EnRhythm implanted by Dr Alex [name] in [place] for complete heart block with syncope  . REPLACEMENT TOTAL KNEE BILATERAL     2006  .  TEE WITHOUT CARDIOVERSION N/A 11/14/2014   Procedure: TRANSESOPHAGEAL ECHOCARDIOGRAM (TEE);  Surgeon: Candee Furbish, MD;  Location[facility] ENDOSCOPY;  Service: Cardiovascular;  Laterality: N/A;  . TRANSURETHRAL INCISION OF PROSTATE N/A 01/29/2017   Procedure: TRANSURETHRAL UNROOFING OF A  PROSTATE ABSCESS;  Surgeon: Alex Rhodes, MD;  Location: [facility] ORS;  Service: Urology;  Laterality: N/A;     Medications Prior to Admission: Prior  to Admission medications   Medication Sig Start Date End Date Taking? Authorizing Provider  ALPRAZolam (XANAX) 0.25 MG tablet Take 1 tablet (0.25 mg total) by mouth at bedtime. 02/17/17   Angiulli, Alex Paganini, PA-C  amiodarone (PACERONE) 200 MG tablet Take 1 tablet (200 mg total) by mouth daily. 07/29/17   Strader, Alex Hertz, PA-C  aspirin 81 MG tablet Take 81 mg by mouth daily.    [provider]  atorvastatin (LIPITOR) 80 MG tablet TAKE 1 TABLET BY MOUTH DAILY 07/09/17   Allred, Alex Rinks, MD  escitalopram (LEXAPRO) 5 MG tablet Take 1 tablet (5 mg total) by mouth at bedtime. 02/18/17   Angiulli, Alex Paganini, PA-C  furosemide (LASIX) 40 MG tablet Take 1 tablet (40 mg total) by mouth daily. 07/29/17   Strader, Alex Hertz, PA-C  lactulose (CHRONULAC) 10 GM/15ML solution Take 30 mLs (20 g total) by mouth daily. 02/18/17   Angiulli, Alex Paganini, PA-C  levETIRAcetam (KEPPRA) 500 MG tablet Take 1 tablet (500 mg total) by mouth 2 (two) times daily. 02/18/17   Angiulli, Alex Paganini, PA-C  magnesium oxide (MAG-OX) 400 (241.3 Mg) MG tablet Take 0.5 tablets (200 mg total) by mouth daily. 02/18/17   Angiulli, Alex Paganini, PA-C  potassium chloride (K-DUR,KLOR-CON) 10 MEQ tablet Take 1 tablet (10 mEq total) by mouth daily. 07/29/17   Strader, Alex Hertz, PA-C  PROAIR HFA 108 [phone] Base) MCG/ACT inhaler Inhale 2 puffs into the lungs every 6 (six) hours as needed. wheezing 07/22/17   [provider]  traZODone (DESYREL) 50 MG tablet Take 50 mg by mouth at bedtime. 07/08/17   [provider]     Allergies:   No Known Allergies  Social History:   Social History   Social History  . Marital status: Married    Spouse name: N/A  . Number of children: 2  . Years of education: 8th grade   Occupational History  . Retired    Social History Main Topics  . Smoking status: Former Smoker    Packs/day: 0.50    Years: 30.00    Types: Cigarettes    Start date: 10/27/1973    Quit date: 10/28/2003  . Smokeless tobacco:  Never Used  . Alcohol use 0.0 oz/week     Comment: 3 glasses of wine per evening  . Drug use: No  . Sexual activity: Not on file   Other Topics Concern  . Not on file   Social History Narrative   Lives in [place] with spouse.  Owns a Scientist, research (medical).   Right-handed.   1 cup coffee per day.    Family History:   The patient's family history includes Alzheimer's disease in his mother; Other in his father; Tuberculosis in his father.    ROS:  Please see the history of present illness.  All other ROS reviewed and negative.     Physical Exam/Data:   Vitals:   08/07/17 1100 08/07/17 1215  BP: 118/62   Pulse: 73 65  Resp: 18   Temp: 97.8 F (36.6 C)   TempSrc: Oral  SpO2: 96%   Weight: 240 lb 8 oz (109.1 kg)   Height: _0  (1.854 m)    No intake or output data in the 24 hours ending 08/07/17 1230 Filed Weights   08/07/17 1100  Weight: 240 lb 8 oz (109.1 kg)   Body mass index is 31.73 kg/m.  Constitutional: He is oriented to person, place, and time. He appears well-developed and well-nourished. No distress.  HENT:  Head: Normocephalic and atraumatic.  Eyes: No scleral icterus.  Neck: JVD present.  Cardiovascular: Normal rate and regular rhythm.   Murmur heard.  Low-pitched systolic murmur is present with a grade of 2/6  at the upper right sternal border, upper left sternal border Pulmonary/Chest: He has decreased breath sounds. He has no wheezes. He has no rales.  Abdominal: He exhibits distension.  Musculoskeletal: He exhibits no edema.  Neurological: He is alert and oriented to person, place, and time.  Skin: Skin is warm and dry.  Psychiatric: He has a normal mood and affect.    EKG:  The ECG that was done  was personally reviewed and demonstrates V paced HR 87  Relevant CV Studies:  Echo 07/28/17 Moderate LVH, EF 50-55, normal wall motion, calcified aortic valve, MAC, mildly reduced RVSF, mild RAE, PASP 51  TEE 11/14/14 EF 55-60, aortic  valve prosthesis functioning normally, mild MR, no LAA clot  Echo 11/06/14 Mild LVH, EF 55, bioprosthetic aortic valve functioning normally (mean 11), MAC, trivial MR, mild LAE, mild RAE, PASP 36  Nuclear stress test 03/14/13 Inferoposterior scar with mild peri-infarct ischemia in the distal lateral wall, EF 48  Cardiac catheterization 03/22/13 LAD ostial 90 LCx occluded, small intermediate branch proximal 80 RCA proximal 50, mid 30-40 LIMA-LAD patent SVG-OM3/OM4 patent  Laboratory Data:  ChemistryNo results for input(s): NA, K, CL, CO2, GLUCOSE, BUN, CREATININE, CALCIUM, GFRNONAA, GFRAA, ANIONGAP in the last 168 hours.  No results for input(s): PROT, ALBUMIN, AST, ALT, ALKPHOS, BILITOT in the last 168 hours. HematologyNo results for input(s): WBC, RBC, HGB, HCT, MCV, MCH, MCHC, RDW, PLT in the last 168 hours. Cardiac EnzymesNo results for input(s): TROPONINI in the last 168 hours. No results for input(s): TROPIPOC in the last 168 hours.  BNPNo results for input(s): BNP, PROBNP in the last 168 hours.  DDimer No results for input(s): DDIMER in the last 168 hours.  Radiology/Studies:  No results found.  Assessment and Plan:   1. Acute on chronic diastolic heart failure ([place]) He returns for follow up and is volume overloaded again.  He is hypoxic and short of breath just sitting up from a lying position in the room. No rales on exam.  Question if this is cor pulmonale rather than diastolic HF.  I have recommended admission to the hospital.  I d/w Dr. Sherren [name] today who agreed.    -  Admit to Tele  -  Lasix 40 mg IV bid  -  Consider CHF Team consult  -  May need R heart cath  -  We should place him on Torsemide at DC to help with diuresis   2. Permanent Atrial Fibrillation He is V paced.  He is not on anticoagulation due to prior subdural hematoma.   3. Coronary artery disease involving coronary bypass graft of native heart without angina pectoris S/p CABG.  He had patent  grafts in 2014.  He denies anginal symptoms.    4. S/P AVR (aortic valve replacement) Recent echo with stable AVR.  Continue SBE prophylaxis.  5. COPD This is likely impacting his breathing as well.   Severity of Illness: The appropriate patient status for this patient is INPATIENT. Inpatient status is judged to be reasonable and necessary in order to provide the required intensity of service to ensure the patient's safety. The patient's presenting symptoms, physical exam findings, and initial radiographic and laboratory data in the context of their chronic comorbidities is felt to place them at high risk for further clinical deterioration. Furthermore, it is not anticipated that the patient will be medically stable for discharge from the hospital within 2 midnights of admission. The following factors support the patient status of inpatient.   " The patient's presenting symptoms include shortness of breath. " The worrisome physical exam findings include elevated JVP, abdominal distention, low O2 sats. " The initial radiographic and laboratory data are worrisome because of n/a. " The chronic co-morbidities include COPD, CAD, atrial fibrillation.   * I certify that at the point of admission it is my clinical judgment that the patient will require inpatient hospital care spanning beyond 2 midnights from the point of admission due to high intensity of service, high risk for further deterioration and high frequency of surveillance required.*    For questions or updates, please contact [place] Please consult [email or web address] for contact info under Cardiology/STEMI.    Signed, Richardson Dopp, PA-C  08/07/2017 12:30 PM

## 2017-08-08 ENCOUNTER — Inpatient Hospital Stay (HOSPITAL_COMMUNITY): Payer: Medicare Other

## 2017-08-08 DIAGNOSIS — I5030 Unspecified diastolic (congestive) heart failure: Secondary | ICD-10-CM | POA: Diagnosis not present

## 2017-08-08 DIAGNOSIS — I5033 Acute on chronic diastolic (congestive) heart failure: Secondary | ICD-10-CM | POA: Diagnosis not present

## 2017-08-08 DIAGNOSIS — Z952 Presence of prosthetic heart valve: Secondary | ICD-10-CM

## 2017-08-08 DIAGNOSIS — I482 Chronic atrial fibrillation: Secondary | ICD-10-CM | POA: Diagnosis not present

## 2017-08-08 DIAGNOSIS — I25118 Atherosclerotic heart disease of native coronary artery with other forms of angina pectoris: Secondary | ICD-10-CM | POA: Diagnosis not present

## 2017-08-08 DIAGNOSIS — Z5309 Procedure and treatment not carried out because of other contraindication: Secondary | ICD-10-CM | POA: Diagnosis not present

## 2017-08-08 DIAGNOSIS — Z953 Presence of xenogenic heart valve: Secondary | ICD-10-CM | POA: Diagnosis not present

## 2017-08-08 LAB — BASIC METABOLIC PANEL
Anion gap: 9 (ref 5–15)
BUN: 20 mg/dL (ref 6–20)
CALCIUM: 8.6 mg/dL — AB (ref 8.9–10.3)
CO2: 26 mmol/L (ref 22–32)
CREATININE: 1.16 mg/dL (ref 0.61–1.24)
Chloride: 105 mmol/L (ref 101–111)
GFR calc non Af Amer: 56 mL/min — ABNORMAL LOW (ref >60)
Glucose, Bld: 91 mg/dL (ref 65–99)
Potassium: 3.9 mmol/L (ref 3.5–5.1)
SODIUM: 140 mmol/L (ref 135–145)

## 2017-08-08 LAB — TROPONIN I: Troponin I: <0.03 ng/mL (ref <0.03)

## 2017-08-08 LAB — MAGNESIUM: Magnesium: 1.8 mg/dL (ref 1.7–2.4)

## 2017-08-08 MED ORDER — FUROSEMIDE 10 MG/ML IJ SOLN
40.0000 mg | Freq: Four times a day (QID) | INTRAMUSCULAR | Status: DC
  Administered 2017-08-08 – 2017-08-10 (×7): 40 mg via INTRAVENOUS
  Filled 2017-08-08 (×7): qty 4

## 2017-08-08 NOTE — Progress Notes (Addendum)
Progress Note  Patient Name: Alex [name] Date of Encounter: 08/08/2017  Primary Cardiologist: Dr Excell Seltzer  Subjective   He feels back to baseline but hasn't walked yet.  Inpatient Medications    Scheduled Meds: . ALPRAZolam  0.25 mg Oral QHS  . amiodarone  200 mg Oral Daily  . aspirin  81 mg Oral Daily  . atorvastatin  80 mg Oral q1800  . enoxaparin (LOVENOX) injection  40 mg Subcutaneous Q24H  . escitalopram  5 mg Oral QHS  . furosemide  40 mg Intravenous BID  . lactulose  20 g Oral Daily  . levETIRAcetam  500 mg Oral BID  . magnesium oxide  200 mg Oral Daily  . potassium chloride  10 mEq Oral BID  . sodium chloride flush  3 mL Intravenous Q12H  . traZODone  50 mg Oral QHS   Continuous Infusions: . sodium chloride     PRN Meds: sodium chloride, acetaminophen, albuterol, ondansetron (ZOFRAN) IV, sodium chloride flush   Vital Signs    Vitals:   08/07/17 1215 08/07/17 2117 08/08/17 0615 08/08/17 0811  BP:  115/67 106/78 103/62  Pulse: 65 64 75 68  Resp:  Temp:  97.7 F (36.5 C) 97.9 F (36.6 C)   TempSrc:  Oral Oral   SpO2:  96% 99% 99%  Weight:   239 lb 1.6 oz (108.5 kg)   Height:        Intake/Output Summary (Last 24 hours) at 08/08/17 0927 Last data filed at 08/08/17 0616  Gross per 24 hour  Intake              480 ml  Output              700 ml  Net             -220 ml   Filed Weights   08/07/17 1100 08/08/17 0615  Weight: 240 lb 8 oz (109.1 kg) 239 lb 1.6 oz (108.5 kg)   Telemetry    V Paced rhythm, frequent PVCs  - Personally Reviewed  Physical Exam   GEN: No acute distress.   Neck: no JVD Cardiac: RRR, no murmurs, rubs, or gallops.  Respiratory: CTA bilaterally. GI: Soft, nontender, non-distended  MS: minimal B/L edema; No deformity. Neuro:  Nonfocal  Psych: Normal affect   Labs    Chemistry Recent Labs Lab 08/07/17 1200 08/08/17 0228  NA 140 140  K 4.2 3.9  CL 109 105  CO2 23 26  GLUCOSE 117* 91  BUN 21* 20    CREATININE 1.07 1.16  CALCIUM 8.6* 8.6*  PROT 6.5  --   ALBUMIN 3.4*  --   AST 39  --   ALT 29  --   ALKPHOS 79  --   BILITOT 1.3*  --   GFRNONAA >60 56*  GFRAA >60 >60  ANIONGAP 8 9    Hematology Recent Labs Lab 08/07/17 1200  WBC 6.0  RBC 3.88*  HGB 10.3*  HCT 35.0*  MCV 90.2  MCH 26.5  MCHC 29.4*  RDW 19.2*  PLT 213   Cardiac Enzymes Recent Labs Lab 08/07/17 1200 08/07/17 2125 08/08/17 0228  TROPONINI <0.03 <0.03 <0.03   No results for input(s): TROPIPOC in the last 168 hours.   BNP Recent Labs Lab 08/07/17 1300  BNP 351.7*    DDimer No results for input(s): DDIMER in the last 168 hours.   Radiology    Dg Chest 2 View  Result Date: 08/08/2017 CLINICAL DATA:  Heart failure. EXAM: CHEST  2 VIEW COMPARISON:  Radiographs of July 27, 2017. FINDINGS: Stable cardiomediastinal silhouette. Sternotomy wires are noted. Status post cardiac valve repair. No pneumothorax is noted. Left-sided pacemaker is unchanged in position. Hypoinflation of the lungs is noted with mild bibasilar subsegmental atelectasis and mild pleural effusions. Bony thorax is unremarkable. IMPRESSION: Hypoinflation of the lungs with mild bibasilar subsegmental atelectasis and mild pleural effusions. Electronically Signed   By: Lupita Raider, M.D.   On: 08/08/2017 08:07   Cardiac Studies     Patient Profile     83 y.o. male admitted with fluid overload  Assessment & Plan    1.A/C Diastolic Heart Failure- ECHO EF 50-55%  - significantly improved volume. I would continue iv lasix 40 mg Q12H until discharge when we will switch to torsemide 20 mg PO BID once fully diuresed. Today 235 lbs, baseline 230 lbs.  Will educate further on use of sliding scale diuretics. Could consider Cardiomems as needed.  He wanted to go home today, however walking down the hallway made him desaturate to SpO2 85%, we will continue iv diuresis and reevaluate tomorrow, also pending PT&OT. Crea stable 1.1. 2. CAD -  CABG -LHC 2014 with patent grafts.On statin, asa. No S/S ischemia.  3. Chronic A Fib-On amio  daily. ? Stopping as he is in A fib chronically.  - In the past he was on Toprol XL 12.5 mg daily. Stopped due to hypotension.  - No anticoagulation with h/o subdural hematoma. 4. S/P AVR Bioprosthetic 2012  5. H/O Subdural Hematoma 11/2016 6. SSS with PPM   For questions or updates, please contact [facility] Please consult [email or web address] for contact info under Cardiology/STEMI.    Signed, [name], MD  08/08/2017, 9:27 AM

## 2017-08-09 DIAGNOSIS — I482 Chronic atrial fibrillation: Secondary | ICD-10-CM | POA: Diagnosis not present

## 2017-08-09 DIAGNOSIS — I5030 Unspecified diastolic (congestive) heart failure: Secondary | ICD-10-CM | POA: Diagnosis not present

## 2017-08-09 DIAGNOSIS — I5033 Acute on chronic diastolic (congestive) heart failure: Secondary | ICD-10-CM | POA: Diagnosis not present

## 2017-08-09 DIAGNOSIS — Z953 Presence of xenogenic heart valve: Secondary | ICD-10-CM | POA: Diagnosis not present

## 2017-08-09 DIAGNOSIS — Z952 Presence of prosthetic heart valve: Secondary | ICD-10-CM | POA: Diagnosis not present

## 2017-08-09 LAB — BASIC METABOLIC PANEL
ANION GAP: 9 (ref 5–15)
BUN: 17 mg/dL (ref 6–20)
CO2: 30 mmol/L (ref 22–32)
CREATININE: 1.2 mg/dL (ref 0.61–1.24)
Calcium: 8.8 mg/dL — ABNORMAL LOW (ref 8.9–10.3)
Chloride: 101 mmol/L (ref 101–111)
GFR calc non Af Amer: 54 mL/min — ABNORMAL LOW (ref >60)
Glucose, Bld: 92 mg/dL (ref 65–99)
POTASSIUM: 3.6 mmol/L (ref 3.5–5.1)
SODIUM: 140 mmol/L (ref 135–145)

## 2017-08-09 MED ORDER — TORSEMIDE 20 MG PO TABS
20.0000 mg | ORAL_TABLET | Freq: Two times a day (BID) | ORAL | Status: DC
  Administered 2017-08-10: 20 mg via ORAL
  Filled 2017-08-09: qty 1

## 2017-08-09 NOTE — Progress Notes (Signed)
Patient ambulated in hallway with walker and standby assistance. Patient slightly unsteady while walking.  He states breathing feels good although oxygen saturation 85% on room air.  Patient's oxygen saturation back up to 94-96% on 2L oxygen.

## 2017-08-10 DIAGNOSIS — I482 Chronic atrial fibrillation: Secondary | ICD-10-CM | POA: Diagnosis not present

## 2017-08-10 DIAGNOSIS — I5033 Acute on chronic diastolic (congestive) heart failure: Secondary | ICD-10-CM | POA: Diagnosis not present

## 2017-08-10 DIAGNOSIS — Z953 Presence of xenogenic heart valve: Secondary | ICD-10-CM | POA: Diagnosis not present

## 2017-08-10 LAB — BASIC METABOLIC PANEL
ANION GAP: 10 (ref 5–15)
BUN: 19 mg/dL (ref 6–20)
CO2: 34 mmol/L — ABNORMAL HIGH (ref 22–32)
Calcium: 8.9 mg/dL (ref 8.9–10.3)
Chloride: 96 mmol/L — ABNORMAL LOW (ref 101–111)
Creatinine, Ser: 1.36 mg/dL — ABNORMAL HIGH (ref 0.61–1.24)
GFR calc Af Amer: 53 mL/min — ABNORMAL LOW (ref >60)
GFR, EST NON AFRICAN AMERICAN: 46 mL/min — AB (ref >60)
Glucose, Bld: 135 mg/dL — ABNORMAL HIGH (ref 65–99)
POTASSIUM: 3.2 mmol/L — AB (ref 3.5–5.1)
SODIUM: 140 mmol/L (ref 135–145)

## 2017-08-10 LAB — MAGNESIUM: Magnesium: 1.8 mg/dL (ref 1.7–2.4)

## 2017-08-10 MED ORDER — POTASSIUM CHLORIDE CRYS ER 10 MEQ PO TBCR: 10.0000 meq | EXTENDED_RELEASE_TABLET | Freq: Two times a day (BID) | ORAL | 3 refills | Status: AC

## 2017-08-10 MED ORDER — TORSEMIDE 20 MG PO TABS: 40.0000 mg | ORAL_TABLET | Freq: Every day | ORAL | Status: DC

## 2017-08-10 MED ORDER — TORSEMIDE 20 MG PO TABS: 20.0000 mg | ORAL_TABLET | Freq: Every day | ORAL | Status: DC

## 2017-08-10 MED ORDER — TORSEMIDE 20 MG PO TABS: ORAL_TABLET | ORAL | 6 refills | Status: DC

## 2017-08-10 MED ORDER — POTASSIUM CHLORIDE CRYS ER 20 MEQ PO TBCR
40.0000 meq | EXTENDED_RELEASE_TABLET | Freq: Two times a day (BID) | ORAL | Status: DC
  Administered 2017-08-10: 40 meq via ORAL
  Filled 2017-08-10: qty 2

## 2017-08-10 NOTE — Evaluation (Signed)
Physical Therapy Evaluation Patient Details Name: Alex [name] MRN: [id number] DOB: 1931/01/13 Today's Date: 08/10/2017   History of Present Illness  Alex [name] a 83 y.o.malewith a history of diastolic heart failure, atrial fibrillation, aortic valve disease status post bioprosthetic AVR in 2012, CAD status post CABG, sick sinus syndrome status post pacemaker. He suffered a subdural hematoma in 2/18.Marland KitchenHe was recently admitted with acute on chronic diastolic CHF. Pt admitted for SOB, found to be overloaded with fluid.  Clinical Impression  Pt admitted with above. Pt reports he was walking without AD PTA but did receive help for ADLs and household chores. Pt now with SOB with ambulation and dec SpO2 on RA and requires RW for safe ambulation. Acute PT to con't to follow to address energy conservation and endurance.  SATURATION QUALIFICATIONS: (This note is used to comply with regulatory documentation for home oxygen)  Patient Saturations on Room Air at Rest = 90-94%  Patient Saturations on Room Air while Ambulating = <88% did not get a reading during ambulation but it was 88% upon standing  Patient Saturations on 2 Liters of oxygen while Ambulating = - unsure as could not get a reading  Please briefly explain why patient needs home oxygen:  Increased SOB and dec SPO2 <88% on RA    Follow Up Recommendations Home health PT;Supervision/Assistance - 24 hour    Equipment Recommendations  None recommended by PT    Recommendations for Other Services       Precautions / Restrictions Precautions Precautions: Fall Precaution Comments: mild confusion Restrictions Weight Bearing Restrictions: No      Mobility  Bed Mobility Overal bed mobility: Needs Assistance Bed Mobility: Supine to Sit     Supine to sit: Supervision     General bed mobility comments: hob flat, but used bed rail, increased time, mildly labored effort  Transfers Overall transfer level: Needs  assistance Equipment used: None Transfers: Sit to/from Stand Sit to Stand: Min assist         General transfer comment: minA to steady pt, pt with wide base of support, slow and guarded to complete stand, pt did use hands to push self up.   Ambulation/Gait Ambulation/Gait assistance: Min guard;Min assist Ambulation Distance (Feet): 120 Feet Assistive device: None;Rolling walker (2 wheeled) Gait Pattern/deviations: Step-through pattern;Decreased stride length;Shuffle;Trunk flexed;Wide base of support Gait velocity: slow Gait velocity interpretation: Below normal speed for age/gender General Gait Details: pt began amb without AD however began reaching and then depending on hallway rail. Pt with SOB, unable to get a pulse ox reading during amb, however it was 88% upon standing on RA. . pt given RW s/p 30 feet, pt with improved stability and fluidity of gait pattern. pt then min guard.  Stairs            Wheelchair Mobility    Modified Rankin (Stroke Patients Only)       Balance Overall balance assessment: Needs assistance Sitting-balance support: Feet supported;No upper extremity supported Sitting balance-[name]: Good     Standing balance support: No upper extremity supported Standing balance-[name]: Fair                               Pertinent Vitals/Pain Pain Assessment: No/denies pain    Home Living Family/patient expects to be discharged to:: Private residence Living Arrangements: Spouse/significant other (hired CNA) Available Help at Discharge: Family;Personal care attendant;Available 24 hours/day Type of Home: House Home Access:  Stairs to enter Entrance Stairs-Rails: Right;Left (can't reach both) Entrance Stairs-Number of Steps: 3 Home Layout: Able to live on main level with bedroom/bathroom (has elevator to the basement) Home Equipment: Walker - 2 wheels;Cane - single point;Shower seat      Prior Function Level of Independence:  Needs assistance   Gait / Transfers Assistance Needed: pt reports he has a RW and cane but does't use them at home  ADL's / Homemaking Assistance Needed: hired help assist with dressing and bathing        Hand Dominance   Dominant Hand: Right    Extremity/Trunk Assessment   Upper Extremity Assessment Upper Extremity Assessment: Generalized weakness    Lower Extremity Assessment Lower Extremity Assessment: Generalized weakness    Cervical / Trunk Assessment Cervical / Trunk Assessment: Kyphotic  Communication   Communication: HOH  Cognition Arousal/Alertness: Awake/alert Behavior During Therapy: WFL for tasks assessed/performed Overall Cognitive Status: No family/caregiver present to determine baseline cognitive functioning                                 General Comments: pt able to follow commands however has noted delayed processing, decreased insight to deficits and safety, and mild confusion. stated it was august and was unable to get the date with cues. but was able to recall 08/10/2017 at end of session      General Comments      Exercises     Assessment/Plan    PT Assessment Patient needs continued PT services  PT Problem List Decreased strength;Decreased range of motion;Decreased activity tolerance;Decreased balance;Decreased mobility;Decreased cognition;Decreased knowledge of use of DME;Decreased safety awareness       PT Treatment Interventions DME instruction;Gait training;Stair training;Functional mobility training;Therapeutic activities;Therapeutic exercise;Balance training;Cognitive remediation    PT Goals (Current goals can be found in the Care Plan section)  Acute Rehab PT Goals Patient Stated Goal: didn't state PT Goal Formulation: With patient Time For Goal Achievement: 08/17/17 Potential to Achieve Goals: Good    Frequency Min 3X/week   Barriers to discharge        Co-evaluation               AM-PAC PT "6 Clicks"  Daily Activity  Outcome Measure Difficulty turning over in bed (including adjusting bedclothes, sheets and blankets)?: A Lot Difficulty moving from lying on back to sitting on the side of the bed? : A Little Difficulty sitting down on and standing up from a chair with arms (e.g., wheelchair, bedside commode, etc,.)?: A Little Help needed moving to and from a bed to chair (including a wheelchair)?: A Little Help needed walking in hospital room?: A Little Help needed climbing 3-5 steps with a railing? : A Little 6 Click Score: 17    End of Session Equipment Utilized During Treatment: Gait belt;Oxygen (2LO2 via [place]) Activity Tolerance: Patient tolerated treatment well Patient left: in chair;with call bell/phone within reach;with chair alarm set Nurse Communication: Mobility status PT Visit Diagnosis: Unsteadiness on feet (R26.81);Muscle weakness (generalized) (M62.81);Difficulty in walking, not elsewhere classified (R26.2)    Time: 1191-4782 PT Time Calculation (min) (ACUTE ONLY): 29 min   Charges:   PT Evaluation $PT Eval Moderate Complexity: 1 Mod PT Treatments $Gait Training: 8-22 mins   PT G Codes:        Lewis Shock, PT, DPT Pager #: [phone] Office #: [phone]   Amoreena Neubert M Blayklee Mable 08/10/2017, 8:14 AM

## 2017-08-10 NOTE — Progress Notes (Signed)
Advanced Heart Failure Rounding Note  PCP: Dr. Alben Spittle Primary Cardiologist: Dr. Excell Seltzer HF: Dr. Gala Romney   Subjective:    Feeling much better. Has walked the halls without difficulty or dizziness. Condom cath remains in place.   Diuresed 3.8 L and down 14 lbs total.   Objective:   Weight Range: 226 lb 4.8 oz (102.6 kg) Body mass index is 29.86 kg/m.   Vital Signs:   Temp:  [97.6 F (36.4 C)-98.5 F (36.9 C)] 98.5 F (36.9 C) (10/15 0900) Pulse Rate:  [64-75] 64 (10/15 0900) Resp:  [18] 18 (10/15 0542) BP: (92-113)/(56-66) 92/62 (10/15 0900) SpO2:  [95 %-100 %] 97 % (10/15 0900) Weight:  [226 lb 4.8 oz (102.6 kg)] 226 lb 4.8 oz (102.6 kg) (10/15 0248) Last BM Date: 08/10/17  Weight change: Filed Weights   08/08/17 0615 08/09/17 0525 08/10/17 0248  Weight: 239 lb 1.6 oz (108.5 kg) 230 lb 6.4 oz (104.5 kg) 226 lb 4.8 oz (102.6 kg)    Intake/Output:   Intake/Output Summary (Last 24 hours) at 08/10/17 1107 Last data filed at 08/10/17 1046  Gross per 24 hour  Intake              840 ml  Output             1225 ml  Net             -385 ml      Physical Exam    General:  Elderly appearing. No resp difficulty HEENT: Normal Neck: Supple. JVP 6-7 cm +. Carotids 2+ bilat; no bruits. No lymphadenopathy or thyromegaly appreciated. Cor: PMI nondisplaced. Irregularly irregular. No rubs, gallops or murmurs. Lungs: Clear Abdomen: Soft, nontender, nondistended. No hepatosplenomegaly. No bruits or masses. Good bowel sounds. Extremities: No cyanosis, clubbing, rash, edema Neuro: Alert & orientedx3, cranial nerves grossly intact. moves all 4 extremities w/o difficulty. Affect pleasant  Telemetry   Afib, Rate controlled. Personally reviewed.   EKG    Afib on admit. Chronic.   Labs    CBC  Recent Labs  08/07/17 1200  WBC 6.0  NEUTROABS 4.4  HGB 10.3*  HCT 35.0*  MCV 90.2  PLT 213   Basic Metabolic Panel  Recent Labs  08/08/17 0228 08/09/17 0411  08/10/17 0451  NA 140 140 140  K 3.9 3.6 3.2*  CL 105 101 96*  CO2 26 30 34*  GLUCOSE 91 92 135*  BUN CREATININE 1.16 1.20 1.36*  CALCIUM 8.6* 8.8* 8.9  MG 1.8  --   --    Liver Function Tests  Recent Labs  08/07/17 1200  AST 39  ALT 29  ALKPHOS 79  BILITOT 1.3*  PROT 6.5  ALBUMIN 3.4*   No results for input(s): LIPASE, AMYLASE in the last 72 hours. Cardiac Enzymes  Recent Labs  08/07/17 1200 08/07/17 2125 08/08/17 0228  TROPONINI <0.03 <0.03 <0.03    BNP: BNP (last 3 results)  Recent Labs  12/03/16 0718 07/27/17 2015 08/07/17 1300  BNP 358.8* 469.3* 351.7*    ProBNP (last 3 results) No results for input(s): PROBNP in the last 8760 hours.   D-Dimer No results for input(s): DDIMER in the last 72 hours. Hemoglobin A1C No results for input(s): HGBA1C in the last 72 hours. Fasting Lipid Panel No results for input(s): CHOL, HDL, LDLCALC, TRIG, CHOLHDL, LDLDIRECT in the last 72 hours. Thyroid Function Tests No results for input(s): TSH, T4TOTAL, T3FREE, THYROIDAB in the last 72 hours.  Invalid input(s): FREET3  Other results:   Imaging     No results found.   Medications:     Scheduled Medications: . ALPRAZolam  0.25 mg Oral QHS  . amiodarone  200 mg Oral Daily  . aspirin  81 mg Oral Daily  . atorvastatin  80 mg Oral q1800  . enoxaparin (LOVENOX) injection  40 mg Subcutaneous Q24H  . escitalopram  5 mg Oral QHS  . lactulose  20 g Oral Daily  . levETIRAcetam  500 mg Oral BID  . magnesium oxide  200 mg Oral Daily  . potassium chloride  40 mEq Oral BID  . sodium chloride flush  3 mL Intravenous Q12H  . torsemide  20 mg Oral BID  . traZODone  50 mg Oral QHS     Infusions: . sodium chloride       PRN Medications:  sodium chloride, acetaminophen, albuterol, ondansetron (ZOFRAN) IV, sodium chloride flush    Patient Profile   Alex [name] is an 83 year old with a history of chronic Afib , S/P Bioprosthetic AVR 2012,  CAD,S/P CABG, sick sinus syndrome with pace maker, and subdural hematoma 11/2016. Most recent LHC 2014 with patent grafts.   Admitted with volume overload.   Assessment/Plan   1.A/C Diastolic Heart Failure - Echo: 07/28/17 EF 50-55%  - Volume status improved.  - Down 14 lbs total. - Agree with transition to torsemide, but will make torsemide 40 mg q am, and torsemide 20 mg q pm.  2. CAD - CABG -LHC 2014 with patent grafts. - Continue statin and ASA. - No s/s of ischemia.    3. Chronic A Fib - Chronic. Rate controlled.  - Consider stopping amiodarone as he is in chronic Afib. Will discuss with MD. - In the past he was on Toprol XL 12.5 mg daily. Stopped due to hypotension.  - No anticoagulation with h/o subdural hematoma. 4. S/P AVR Bioprosthetic 2012  - Stable on most recent echo.  5. H.O Subdural Hematoma 11/2016 - Stable.  6. SSS with PPM  - Stable.  7. AKI  - Creatinine up from 1.0 on admit to 1.36 with diuresis. Continue to follow.  8. Hypokalemia - Will supp.  - Check Mg.   Weight below perceived baseline and creatinine up slightly. Now on po meds. Likely home today with close HF clinic follow up.   Length of Stay: 3  Alex [name]  08/10/2017, 11:07 AM  Advanced Heart Failure Team Pager [phone] (M-F; 7a - 4p)  Please contact [facility] for night-coverage after hours (4p -7a ) and weekends on [email or web address]   Patient seen and examined with the above-signed Advanced Practice Provider and/or Housestaff. I personally reviewed laboratory data, imaging studies and relevant notes. I independently examined the patient and formulated the important aspects of the plan. I have edited the note to reflect any of my changes or salient points. I have personally discussed the plan with the patient and/or family.  Weight down 14 pounds. Breathing much better. Long talk with him and family about management of fluid I/Os and use of sliding scale diuretics. Will change  lasix to torsemide 40/20. Will see back on Friday with BMET to make sure we haven't overdiuresed him. Goal weight likely 226-229. AF well controlled. No AC due to SDH in 2/18.   Arvilla Meres, MD  10:56 PM

## 2017-08-10 NOTE — Progress Notes (Addendum)
Pt IV discontiuned, catheter intact and telemetry removed. Pt has all belongings. Pt discharge teaching provided at bedside with pt and pt wife.  Pt states he does not have an oxygen tank at home  [facility] aware, placed order  Informed case management

## 2017-08-10 NOTE — Discharge Summary (Signed)
Advanced Heart Failure Discharge Note  Discharge Summary   Patient ID: Alex [name] MRN: [id number], DOB/AGE: 1930-10-28 83 y.o. Admit date: 08/07/2017 D/C date:     08/10/2017   Primary Discharge Diagnoses:  1.A/C Diastolic Heart Failure - Echo: 07/28/17 EF 50-55%  2. CAD - CABG -LHC 2014 with patent grafts. 3. Chronic A Fib 4. S/P AVR Bioprosthetic 2012  5. H.O Subdural Hematoma 11/2016 6. SSS with PPM  7. AKI  8. Hypokalemia  Hospital Course:   ELIAV MECHLING is a 83 y.o. male with chronic afib, s/p bioprosthetic AVR, CAD s/p CABG, sick sinus s/p ppm, SDH 11/2016, and diastolic CHF.   He presented to [facility] office 08/07/17 with worsening SOB and found to be volume overloaded with DOE and at rest with 8 lb weight loss. Admitted for IV diuresis and CHF consult.   Pt diuresed well on IV lasix and transitioned to torsemide (from lasix PTA) for discharge. Hypokalemia due to diuresis treated with supplementation. Foley catheter placed for aggressive diuresis and pt passed Void test prior to discharge.   Overall this pt diuresed + 4 L (I/O off due to incontinence) and down 14 lbs from admission weight.   Pt was also found to need O2 with ambulation, so this was also ordered via [facility] and Case Management.   He will be discharged to home in stable condition with follow up as below.   Discharge Weight Range: 226 lbs Discharge Vitals: Blood pressure (!) 96/42, pulse 66, temperature 97.7 F (36.5 C), temperature source Oral, resp. rate 18, height  (1.854 m), weight 226 lb 4.8 oz (102.6 kg), SpO2 95 %.  Labs: Lab Results  Component Value Date   WBC 6.0 08/07/2017   HGB 10.3 (L) 08/07/2017   HCT 35.0 (L) 08/07/2017   MCV 90.2 08/07/2017   PLT 213 08/07/2017    Recent Labs Lab 08/07/17 1200  08/10/17 0451  NA 140  < > 140  K 4.2  < > 3.2*  CL 109  < > 96*  CO2 23  < > 34*  BUN 21*  < > 19  CREATININE 1.07  < > 1.36*  CALCIUM 8.6*  < > 8.9  PROT 6.5  --   --   BILITOT 1.3*   --   --   ALKPHOS 79  --   --   ALT 29  --   --   AST 39  --   --   GLUCOSE 117*  < > 135*  < > = values in this interval not displayed. No results found for: CHOL, HDL, LDLCALC, TRIG BNP (last 3 results)  Recent Labs  12/03/16 0718 07/27/17 2015 08/07/17 1300  BNP 358.8* 469.3* 351.7*    ProBNP (last 3 results) No results for input(s): PROBNP in the last 8760 hours.   Diagnostic Studies/Procedures   No results found.  Discharge Medications   Allergies as of 08/10/2017   No Known Allergies     Medication List    STOP taking these medications   furosemide 40 MG tablet Commonly known as:  LASIX     TAKE these medications   ALPRAZolam 0.25 MG tablet Commonly known as:  XANAX Take 1 tablet (0.25 mg total) by mouth at bedtime.   amiodarone 200 MG tablet Commonly known as:  PACERONE Take 1 tablet (200 mg total) by mouth daily.   aspirin 81 MG tablet Take 81 mg by mouth daily.   atorvastatin 80 MG tablet Commonly known  as:  LIPITOR TAKE 1 TABLET BY MOUTH DAILY What changed:  See the new instructions.   escitalopram 5 MG tablet Commonly known as:  LEXAPRO Take 1 tablet (5 mg total) by mouth at bedtime.   lactulose 10 GM/15ML solution Commonly known as:  CHRONULAC Take 30 mLs (20 g total) by mouth daily.   levETIRAcetam 500 MG tablet Commonly known as:  KEPPRA Take 1 tablet (500 mg total) by mouth 2 (two) times daily.   magnesium oxide 400 (241.3 Mg) MG tablet Commonly known as:  MAG-OX Take 0.5 tablets (200 mg total) by mouth daily.   potassium chloride 10 MEQ tablet Commonly known as:  K-DUR,KLOR-CON Take 1 tablet (10 mEq total) by mouth 2 (two) times daily. What changed:  when to take this   PROAIR HFA 108 (90 Base) MCG/ACT inhaler Generic drug:  albuterol Inhale 2 puffs into the lungs every 6 (six) hours as needed for shortness of breath. wheezing   torsemide 20 MG tablet Commonly known as:  DEMADEX Take 2 tablets (40 mg total) every  morning, and 1 tablet (20 mg) every evening. Hold for weight < 222 and call HF clinic.   traZODone 50 MG tablet Commonly known as:  DESYREL Take 50 mg by mouth at bedtime.            Durable Medical Equipment        Start     Ordered   08/10/17 1358  For home use only DME oxygen  Once    Question Answer Comment  Mode or (Route) Nasal cannula   Liters per Minute 2   Frequency Continuous (stationary and portable oxygen unit needed)   Oxygen conserving device Yes   Oxygen delivery system Gas      08/10/17 1358   08/10/17 1128  Heart failure home health orders  (Heart failure home health orders / Face to face)  Once    Comments:  Heart Failure Follow-up Care:  Verify follow-up appointments per Patient Discharge Instructions. Confirm transportation arranged. Reconcile home medications with discharge medication list. Remove discontinued medications from use. Assist patient/caregiver to manage medications using pill box. Reinforce low sodium food selection Assessments: Vital signs and oxygen saturation at each visit. Assess home environment for safety concerns, caregiver support and availability of low-sodium foods. Consult Child psychotherapist, PT/OT, Dietitian, and CNA based on assessments. Perform comprehensive cardiopulmonary assessment. Notify MD for any change in condition or weight gain of 3 pounds in one day or 5 pounds in one week with symptoms. Daily Weights and Symptom Monitoring: Ensure patient has access to scales. Teach patient/caregiver to weigh daily before breakfast and after voiding using same scale and record.    Teach patient/caregiver to track weight and symptoms and when to notify Provider. Activity: Develop individualized activity plan with patient/caregiver.  Labs as needed.  Question Answer Comment  Heart Failure Follow-up Care Advanced Heart Failure (AHF) Clinic at [phone]   Obtain the following labs Other see comments   Lab frequency Other see comments    Fax lab results to Other see comments   Diet Low Sodium Heart Healthy   Fluid restrictions: 2000 mL Fluid   Initiate Heart Failure Clinic Diuretic Protocol to be used by [facility] only ( to be ordered by Heart Failure Team Providers Only) Yes      08/10/17 1127      Disposition   The patient will be discharged in stable condition to home with follow up as below.  Discharge  Instructions    (HEART FAILURE PATIENTS) Call MD:  Anytime you have any of the following symptoms: 1) 3 pound weight gain in 24 hours or 5 pounds in 1 week 2) shortness of breath, with or without a dry hacking cough 3) swelling in the hands, feet or stomach 4) if you have to sleep on extra pillows at night in order to breathe.    Complete by:  As directed    Diet - low sodium heart healthy    Complete by:  As directed    Heart Failure patients record your daily weight using the same scale at the same time of day    Complete by:  As directed    Increase activity slowly    Complete by:  As directed      Follow-up Information    [facility] Follow [facility].   Why:  They will do your home health care at your home Contact information: [address] [place] [address] [phone]        [facility] Follow up on 08/14/2017.   Specialty:  Cardiology Why:  at 130 pm for post hospital follow up. Psychologist, sport and exercise thru [employer] off of [address], underground parking for your right. Code for entry is 8000. Can also park in lower ED lot and enter thru blue awning.  Contact information: [address] [id number] mc [place] [address] [phone]       [name]., MD.   Specialty:  Family Medicine Why:  Office will call patient Contact information: [address] [place] [address] [phone]             Duration of Discharge Encounter: Greater than 35 minutes   Signed, Luane School 08/10/2017, 2:20 PM   Agree.  Weight down 14 pounds. Breathing much better. Long talk with him and family about management of fluid I/Os and use of sliding scale diuretics. Will change lasix to torsemide 40/20. Will see back on Friday with BMET to make sure we haven't overdiuresed him. Goal weight likely 226-229. AF well controlled. No AC due to SDH in 2/18.   Arvilla Meres, MD  10:57 PM

## 2017-08-10 NOTE — Progress Notes (Signed)
Pt wife states she wants to be discharged and not wait for oxygen  Case manager and MD aware Pt discharged via wheelchair

## 2017-08-10 NOTE — Progress Notes (Addendum)
Case manager requested more clear evaluation   SATURATION QUALIFICATIONS: (Thisnote is usedto comply with regulatory documentation for home oxygen)  Patient Saturations on Room Air at Rest = 90%  Patient Saturations on [employer] while Ambulating = 88%  Patient Saturations on 2Liters of oxygen while Ambulating = 95%  Please briefly explain why patient needs home oxygen:Increased shortness of breath

## 2017-08-10 NOTE — Progress Notes (Signed)
Pt received oxygen tank prior to discharge Case management aware

## 2017-08-10 NOTE — Care Management Note (Signed)
Case Management Note  Patient Details  Name: CHUCKY HOMES MRN: [id number] Date of Birth: 03-03-1931  Subjective/Objective:  CHF                 Action/Plan: Patient lives at home with spouse; WUJ:[id number], Ellin Mayhew., MD; has private insurance with [facility] with prescription drug coverage; pharmacy of choice is [facility]; DME - home oxygen, cane, walker; has a personal care service with [employer] - a nurses aide assist the patient at home 5 days a week from 10 am - 4 pm; HHC choice offered, patient chose [facility]; Lupita Leash with [facility] called for arrangements;  Expected Discharge Date:  08/10/17               Expected Discharge Plan:  Home w Home Health Services  Discharge planning Services  CM Consult  HH Arranged:   [facility], PT [facility] Agency:   [facility]  Status of Service:  In process, will continue to follow  Reola Mosher [phone] 08/10/2017, 11:55 AM

## 2017-08-10 NOTE — Progress Notes (Signed)
Oxygen for home ordered as requested and to be delivered to the patient's room today prior to discharging home. Abelino Derrick [facility] [phone]

## 2017-08-10 NOTE — Progress Notes (Signed)
Physical therapy Note  SpO2 monitored during ambulation and PT eval  SATURATION QUALIFICATIONS: (This note is used to comply with regulatory documentation for home oxygen)  Patient Saturations on Room Air at Rest = 90-94%  Patient Saturations on Room Air while Ambulating = <88% did not get a reading during ambulation but it was 88% upon standing  Patient Saturations on 2 Liters of oxygen while Ambulating =  unsure as could not get a reading from pulse ox however pt with SOB  Please briefly explain why patient needs home oxygen:  Increased SOB and dec SPO2 <88% on RA   Lewis Shock, PT, DPT Pager #: [phone] Office #: [phone]

## 2017-08-13 DIAGNOSIS — I5033 Acute on chronic diastolic (congestive) heart failure: Secondary | ICD-10-CM | POA: Diagnosis not present

## 2017-08-14 ENCOUNTER — Encounter (HOSPITAL_COMMUNITY): Payer: Self-pay

## 2017-08-14 ENCOUNTER — Telehealth (HOSPITAL_COMMUNITY): Payer: Self-pay

## 2017-08-14 ENCOUNTER — Ambulatory Visit (HOSPITAL_COMMUNITY)
Admit: 2017-08-14 | Discharge: 2017-08-14 | Disposition: A | Discharge status: Home / Self Care | Payer: Medicare Other | Attending: Internal Medicine | Admitting: Internal Medicine

## 2017-08-14 VITALS — BP 114/62 | HR 95 | Wt 228.0 lb

## 2017-08-14 DIAGNOSIS — G4700 Insomnia, unspecified: Secondary | ICD-10-CM | POA: Insufficient documentation

## 2017-08-14 DIAGNOSIS — Z87891 Personal history of nicotine dependence: Secondary | ICD-10-CM | POA: Insufficient documentation

## 2017-08-14 DIAGNOSIS — I4821 Permanent atrial fibrillation: Secondary | ICD-10-CM

## 2017-08-14 DIAGNOSIS — I5032 Chronic diastolic (congestive) heart failure: Secondary | ICD-10-CM | POA: Diagnosis not present

## 2017-08-14 DIAGNOSIS — Z8489 Family history of other specified conditions: Secondary | ICD-10-CM | POA: Diagnosis not present

## 2017-08-14 DIAGNOSIS — T502X5A Adverse effect of carbonic-anhydrase inhibitors, benzothiadiazides and other diuretics, initial encounter: Secondary | ICD-10-CM | POA: Insufficient documentation

## 2017-08-14 DIAGNOSIS — I2510 Atherosclerotic heart disease of native coronary artery without angina pectoris: Secondary | ICD-10-CM | POA: Diagnosis not present

## 2017-08-14 DIAGNOSIS — I482 Chronic atrial fibrillation: Secondary | ICD-10-CM | POA: Diagnosis not present

## 2017-08-14 DIAGNOSIS — N189 Chronic kidney disease, unspecified: Secondary | ICD-10-CM | POA: Diagnosis not present

## 2017-08-14 DIAGNOSIS — F419 Anxiety disorder, unspecified: Secondary | ICD-10-CM | POA: Insufficient documentation

## 2017-08-14 DIAGNOSIS — G4733 Obstructive sleep apnea (adult) (pediatric): Secondary | ICD-10-CM | POA: Insufficient documentation

## 2017-08-14 DIAGNOSIS — N183 Chronic kidney disease, stage 3 unspecified: Secondary | ICD-10-CM

## 2017-08-14 DIAGNOSIS — Z79899 Other long term (current) drug therapy: Secondary | ICD-10-CM | POA: Diagnosis not present

## 2017-08-14 DIAGNOSIS — R0602 Shortness of breath: Secondary | ICD-10-CM | POA: Diagnosis not present

## 2017-08-14 DIAGNOSIS — I481 Persistent atrial fibrillation: Secondary | ICD-10-CM | POA: Insufficient documentation

## 2017-08-14 DIAGNOSIS — I350 Nonrheumatic aortic (valve) stenosis: Secondary | ICD-10-CM | POA: Diagnosis not present

## 2017-08-14 DIAGNOSIS — Z820 Family history of epilepsy and other diseases of the nervous system: Secondary | ICD-10-CM | POA: Diagnosis not present

## 2017-08-14 DIAGNOSIS — E785 Hyperlipidemia, unspecified: Secondary | ICD-10-CM | POA: Insufficient documentation

## 2017-08-14 DIAGNOSIS — E876 Hypokalemia: Secondary | ICD-10-CM | POA: Diagnosis not present

## 2017-08-14 DIAGNOSIS — Z953 Presence of xenogenic heart valve: Secondary | ICD-10-CM | POA: Insufficient documentation

## 2017-08-14 DIAGNOSIS — Z952 Presence of prosthetic heart valve: Secondary | ICD-10-CM

## 2017-08-14 DIAGNOSIS — I6200 Nontraumatic subdural hemorrhage, unspecified: Secondary | ICD-10-CM | POA: Insufficient documentation

## 2017-08-14 DIAGNOSIS — Z951 Presence of aortocoronary bypass graft: Secondary | ICD-10-CM | POA: Insufficient documentation

## 2017-08-14 DIAGNOSIS — Z7982 Long term (current) use of aspirin: Secondary | ICD-10-CM | POA: Insufficient documentation

## 2017-08-14 DIAGNOSIS — Z9889 Other specified postprocedural states: Secondary | ICD-10-CM | POA: Insufficient documentation

## 2017-08-14 DIAGNOSIS — J449 Chronic obstructive pulmonary disease, unspecified: Secondary | ICD-10-CM | POA: Diagnosis not present

## 2017-08-14 DIAGNOSIS — I495 Sick sinus syndrome: Secondary | ICD-10-CM | POA: Diagnosis not present

## 2017-08-14 DIAGNOSIS — I25810 Atherosclerosis of coronary artery bypass graft(s) without angina pectoris: Secondary | ICD-10-CM

## 2017-08-14 LAB — BASIC METABOLIC PANEL
Anion gap: 8 (ref 5–15)
BUN: 26 mg/dL — AB (ref 6–20)
CALCIUM: 8.8 mg/dL — AB (ref 8.9–10.3)
CO2: 33 mmol/L — ABNORMAL HIGH (ref 22–32)
CREATININE: 1.42 mg/dL — AB (ref 0.61–1.24)
Chloride: 99 mmol/L — ABNORMAL LOW (ref 101–111)
GFR calc Af Amer: 51 mL/min — ABNORMAL LOW (ref >60)
GFR, EST NON AFRICAN AMERICAN: 44 mL/min — AB (ref >60)
Glucose, Bld: 114 mg/dL — ABNORMAL HIGH (ref 65–99)
POTASSIUM: 3.6 mmol/L (ref 3.5–5.1)
SODIUM: 140 mmol/L (ref 135–145)

## 2017-08-14 NOTE — Patient Instructions (Addendum)
No changes to medication at this time.  Follow up 2 months with Dr. Gala RomneyBensimhon.  _____________________________________________________________  [place] Ridge[place] Code: 8002   Take all medication as prescribed the day of your appointment. Bring all medications with you to your appointment.  Do the following things EVERYDAY: 1) Weigh yourself in the morning before breakfast. Write it down and keep it in a log. 2) Take your medicines as prescribed 3) Eat low salt foods-Limit salt (sodium) to 2000 mg per day.  4) Stay as active as you can everyday 5) Limit all fluids for the day to less than 2 liters

## 2017-08-14 NOTE — Telephone Encounter (Signed)
CHF Clinic appointment reminder call placed to patient for upcoming post-hospital follow up.  Does understand purpose of this appointment and where CHF Clinic is located? Yes  How is patient feeling? Well, no complaints  Does patient have all of their medications since their recent discharge? yes  Patient also reminded to take all medications as prescribed on the day of his/her appointment and to bring all medications to this appointment.  Advised to call our office for tardiness or cancellations/rescheduling needs.  .Bradley, Megan Genevea  

## 2017-08-14 NOTE — Addendum Note (Signed)
Encounter addended by: Chyrl CivatteBradley, Chauna Osoria G, RN on: 08/14/2017  2:14 PM<BR>    Actions taken: Order list changed, Diagnosis association updated

## 2017-08-14 NOTE — Progress Notes (Signed)
Advanced Heart Failure Clinic Note   Referring Physician: Primary Care: Primary Cardiologist:  HPI: Alex [name] is a 83 y.o. male with chronic afib, s/p bioprosthetic AVR, CAD s/p CABG, sick sinus s/p ppm, SDH 11/2016, and diastolic CHF.   He presented to [facility] office 08/07/17 with worsening SOB and found to be volume overloaded with DOE and at rest with 8 lb weight loss. Admitted for IV diuresis and CHF consult. Pt diuresed well on IV lasix and transitioned to torsemide (from lasix PTA) for discharge. Hypokalemia due to diuresis treated with supplementation. Foley catheter placed for aggressive diuresis and pt passed Void test prior to discharge. Discharge weight was 226 pounds.   Overall this pt diuresed + 4 L (I/O off due to incontinence) and down 14 lbs from admission weight.   Pt was also found to need O2 with ambulation, so this was also ordered via [facility] and Case Management.   Returns today for HF follow up. Weights at home are "in the 220's". Not wearing oxygen all the time at home. He continues to feel SOB with walking throughout his home and with ADL's. Drinking less than 2L a day, avoiding salt. But then says he had brunswick stew for dinner.    Review of Systems: [y] = yes, [ ]  = no   General: Weight gain [ ] ; Weight loss [ ] ; Anorexia [ ] ; Fatigue [ ] ; Fever [ ] ; Chills [ ] ; Weakness [ ]   Cardiac: Chest pain/pressure [ ] ; Resting SOB [ y]; Exertional SOB [place] ]; Orthopnea [ ] ; Pedal Edema [ ] ; Palpitations [ ] ; Syncope [ ] ; Presyncope [ ] ; Paroxysmal nocturnal dyspnea[ ]   Pulmonary: Cough [ ] ; Wheezing[ ] ; Hemoptysis[ ] ; Sputum [ ] ; Snoring [ ]   GI: Vomiting[ ] ; Dysphagia[ ] ; Melena[ ] ; Hematochezia [ ] ; Heartburn[ ] ; Abdominal pain [ ] ; Constipation [ ] ; Diarrhea [ ] ; BRBPR [ ]   GU: Hematuria[ ] ; Dysuria [ ] ; Nocturia[ ]   Vascular: Pain in legs with walking [ ] ; Pain in feet with lying flat [ ] ; Non-healing sores [ ] ; Stroke [ ] ; TIA [ ] ; Slurred speech [ ] ;  Neuro:  Headaches[ ] ; Vertigo[ ] ; Seizures[ ] ; Paresthesias[ ] ;Blurred vision [ ] ; Diplopia [ ] ; Vision changes [ ]   Ortho/Skin: Arthritis [ y]; Joint pain [ ] ; Muscle pain [ ] ; Joint swelling [ ] ; Back Pain [ ] ; Rash [ ]   Psych: Depression[ ] ; Anxiety[ ]   Heme: Bleeding problems [ ] ; Clotting disorders [ ] ; Anemia [ ]   Endocrine: Diabetes [ ] ; Thyroid dysfunction[ ]    Past Medical History:  Diagnosis Date  . Anxiety   . Aortic stenosis    s/p AVR by Dr Laneta Simmers  . COPD (chronic obstructive pulmonary disease) (HCC)   . History of coronary artery disease    status post stenting of the marginal circumflex in 12/2003 and again in 2009  . Hyperlipidemia   . Insomnia   . Morbid obesity (HCC)    weight 243 pounds, BMI 31.2kg/m2, BSA 2.36 square meters  . Obstructive sleep apnea    compliant with CPAP  . Persistent atrial fibrillation (HCC)   . Seizure (HCC)   . Traumatic subdural hematoma without loss of consciousness (HCC)     Current Outpatient Prescriptions  Medication Sig Dispense Refill  . ALPRAZolam (XANAX) 0.25 MG tablet Take 1 tablet (0.25 mg total) by mouth at bedtime. 6 tablet 0  . amiodarone (PACERONE) 200 MG tablet Take 1 tablet (200 mg total) by mouth daily. 60  tablet 3  . aspirin 81 MG tablet Take 81 mg by mouth daily.    Marland Kitchen. atorvastatin (LIPITOR) 80 MG tablet TAKE 1 TABLET BY MOUTH DAILY (Patient taking differently: TAKE 1 TABLET (80mg ) BY MOUTH DAILY) 90 tablet 1  . escitalopram (LEXAPRO) 5 MG tablet Take 1 tablet (5 mg total) by mouth at bedtime.    Marland Kitchen. lactulose (CHRONULAC) 10 GM/15ML solution Take 30 mLs (20 g total) by mouth daily. 240 mL 0  . levETIRAcetam (KEPPRA) 500 MG tablet Take 1 tablet (500 mg total) by mouth 2 (two) times daily. 60 tablet 0  . magnesium oxide (MAG-OX) 400 (241.3 Mg) MG tablet Take 0.5 tablets (200 mg total) by mouth daily.    . potassium chloride (K-DUR,KLOR-CON) 10 MEQ tablet Take 1 tablet (10 mEq total) by mouth 2 (two) times daily. 60 tablet 3  .  PROAIR HFA 108 (90 Base) MCG/ACT inhaler Inhale 2 puffs into the lungs every 6 (six) hours as needed for shortness of breath. wheezing  11  . torsemide (DEMADEX) 20 MG tablet Take 2 tablets (40 mg total) every morning, and 1 tablet (20 mg) every evening. Hold for weight < 222 and call HF clinic. 100 tablet 6  . traZODone (DESYREL) 50 MG tablet Take 50 mg by mouth at bedtime.  5   No current facility-administered medications for this encounter.     No Known Allergies    Social History   Social History  . Marital status: Married    Spouse name: N/A  . Number of children: 2  . Years of education: 8th grade   Occupational History  . Retired    Social History Main Topics  . Smoking status: Former Smoker    Packs/day: 0.50    Years: 30.00    Types: Cigarettes    Start date: 10/27/1973    Quit date: 10/28/2003  . Smokeless tobacco: Never Used  . Alcohol use 0.0 oz/week     Comment: 3 glasses of wine per evening  . Drug use: No  . Sexual activity: Not on file   Other Topics Concern  . Not on file   Social History Narrative   [place].  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   Right-handed.   1 cup coffee per day.      Family History  Problem Relation Age of Onset  . Alzheimer's disease Mother   . Tuberculosis Father   . Other Father        Spinal Meningitis    Vitals:   08/14/17 1341  BP: 114/62  Pulse: 95  SpO2: 96%  Weight: 228 lb (103.4 kg)     PHYSICAL EXAM: General:  Obese appearing. No respiratory difficulty HEENT: normal Neck: supple. 7-8 cm JVD. Carotids 2+ bilat; no bruits. No lymphadenopathy or thyromegaly appreciated. Cor: PMI nondisplaced. irregular rate & rhythm. No rubs, gallops or murmurs. Lungs: clear Abdomen: soft, nontender, nondistended. No hepatosplenomegaly. No bruits or masses. Good bowel sounds. Extremities: no cyanosis, clubbing, rash, trace pedal edema bilaterally.  Neuro: alert & oriented x 3, cranial nerves  grossly intact. moves all 4 extremities w/o difficulty. Affect pleasant.    ASSESSMENT & PLAN:  1.Chronic Diastolic Heart Failure - Echo: 07/28/17 EF 50-55%  - NYHA III - Volume appears stable on exam.  - Continue torsemide 40 mg in the am and 20 mg in the pm.  - BMET today.  - Advised him to avoid salt in his diet and keep fluid  less than 2L a day.  - I advised him to wear his oxygen, he says that he forgets, but I think it would help when he gets SOB with ADL's.   2. CAD - CABG -LHC 2014 with patent grafts. - Denies chest pain - Continue ASA  3. Chronic A Fib - Rate controlled on Amio (using for rate control).  - not on anticoagulation with history of subdural hematoma.   4. S/P AVR Bioprosthetic 2012  - Stable by last Echo.   5. H.O Subdural Hematoma 11/2016 - Stable.   6. SSS with PPM  - Stable.   7. CKD  - Creatinine  1.2-1.3  Follow up in 2 months.   Little Ishikawa, NP 08/14/17

## 2017-08-17 ENCOUNTER — Encounter (HOSPITAL_COMMUNITY): Payer: Medicare Other

## 2017-09-03 ENCOUNTER — Encounter (INDEPENDENT_AMBULATORY_CARE_PROVIDER_SITE_OTHER): Payer: Self-pay

## 2017-09-03 ENCOUNTER — Encounter: Payer: Self-pay | Admitting: Cardiovascular Disease

## 2017-09-03 ENCOUNTER — Ambulatory Visit: Payer: Medicare Other | Admitting: Cardiovascular Disease

## 2017-09-03 VITALS — BP 110/50 | HR 70 | Ht 74.0 in | Wt 227.0 lb

## 2017-09-03 DIAGNOSIS — Z952 Presence of prosthetic heart valve: Secondary | ICD-10-CM | POA: Diagnosis not present

## 2017-09-03 DIAGNOSIS — I5032 Chronic diastolic (congestive) heart failure: Secondary | ICD-10-CM

## 2017-09-03 DIAGNOSIS — E785 Hyperlipidemia, unspecified: Secondary | ICD-10-CM | POA: Diagnosis not present

## 2017-09-03 DIAGNOSIS — I480 Paroxysmal atrial fibrillation: Secondary | ICD-10-CM | POA: Diagnosis not present

## 2017-09-03 DIAGNOSIS — I2510 Atherosclerotic heart disease of native coronary artery without angina pectoris: Secondary | ICD-10-CM | POA: Diagnosis not present

## 2017-09-03 NOTE — Patient Instructions (Addendum)
Medication Instructions:  Your provider recommends that you continue on your current medications as directed. Please refer to the Current Medication list given to you today.    Labwork: None  Testing/Procedures: None  Follow-Up: You have an appointment with Dr. Excell Seltzerooper on December 11, 2017 at 10:20AM.   Any Other Special Instructions Will Be Listed Below (If Applicable).     If you need a refill on your cardiac medications before your next appointment, please call your pharmacy.

## 2017-09-03 NOTE — Progress Notes (Signed)
Cardiology Office Note Date:  09/03/2017   ID:  Alex [name], DOB [birth year], MRN [id number]  PCP:  Elijio MilesWeaver, Alex W., MD  Cardiologist:  Alex Bollmanooper, Sabino Denning, MD    Chief Complaint  Patient presents with  . Acute on chronic diastolic heart failure     History of Present Illness: Alex DuhamelJohn R [name] is a 83 y.o. male who presents for follow-up evaluation.   The patient is followed for diastolic heart failure, atrial fibrillation, and aortic valve disease status post bioprosthetic aortic valve replacement in 2012.  At that time he underwent coronary bypass grafting as well.  His most recent heart catheterization in 2014 demonstrated patency of his bypass graft.  He has had failing health over the last year with progressive shortness of breath and he sustained a subdural hematoma in February 2018 with a prolonged hospitalization and rehab stay.  He is been admitted with congestive heart failure in September and October of this year.  At the time of his most recent admission in October he was seen by the advanced heart failure team and is now followed in the heart failure clinic as well.  The patient is here with his wife today.  He is feeling better.  He continues to have shortness of breath with activity but breathing is much improved compared to his baseline.  He denies chest pain or pressure.  He denies leg swelling, orthopnea, or PND.  He has had no recent cough, fever, or chills.  He is compliant with his diuretics.  He is weighing regularly and avoiding salt.   Past Medical History:  Diagnosis Date  . Anxiety   . Aortic stenosis    s/p AVR by Alex Alex [name]  . COPD (chronic obstructive pulmonary disease) (HCC)   . History of coronary artery disease    status post stenting of the marginal circumflex in 12/2003 and again in 2009  . Hyperlipidemia   . Insomnia   . Morbid obesity (HCC)    weight 243 pounds, BMI 31.2kg/m2, BSA 2.36 square meters  . Obstructive sleep apnea    compliant with CPAP  .  Persistent atrial fibrillation (HCC)   . Seizure (HCC)   . Traumatic subdural hematoma without loss of consciousness [facility](HCC)     Past Surgical History:  Procedure Laterality Date  . AORTIC VALVE REPLACEMENT (AVR)/CORONARY ARTERY BYPASS GRAFTING (CABG)   08/05/2011   LIMA to LAD, sequential saphenous vein graft to third and fourth obtuse marginal branches of the circumflex, aortic valve replacement using a 23 mm Edwards pericardial valve  . APPENDECTOMY    . CAROTID ENDARTERECTOMY     Alex Alex [name]  . permanent pacemaker     MDT EnRhythm implanted by Alex [name] in [place] for complete heart block with syncope  . REPLACEMENT TOTAL KNEE BILATERAL     2006    Current Outpatient Medications  Medication Sig Dispense Refill  . ALPRAZolam (XANAX) 0.25 MG tablet Take 1 tablet (0.25 mg total) by mouth at bedtime. 6 tablet 0  . amiodarone (PACERONE) 200 MG tablet Take 1 tablet (200 mg total) by mouth daily. 60 tablet 3  . aspirin EC 81 MG tablet Take 81 mg daily by mouth.    Marland Kitchen. atorvastatin (LIPITOR) 80 MG tablet TAKE 1 TABLET BY MOUTH DAILY (Patient taking differently: TAKE 1 TABLET (80mg ) BY MOUTH DAILY) 90 tablet 1  . escitalopram (LEXAPRO) 5 MG tablet Take 1 tablet (5 mg total) by mouth at bedtime.    Marland Kitchen. lactulose (  CHRONULAC) 10 GM/15ML solution Take 30 mLs (20 g total) by mouth daily. 240 mL 0  . levETIRAcetam (KEPPRA) 500 MG tablet Take 1 tablet (500 mg total) by mouth 2 (two) times daily. 60 tablet 0  . magnesium oxide (MAG-OX) 400 (241.3 Mg) MG tablet Take 0.5 tablets (200 mg total) by mouth daily.    . potassium chloride (K-DUR,KLOR-CON) 10 MEQ tablet Take 1 tablet (10 mEq total) by mouth 2 (two) times daily. 60 tablet 3  . PROAIR HFA 108 (90 Base) MCG/ACT inhaler Inhale 2 puffs into the lungs every 6 (six) hours as needed for shortness of breath. wheezing  11  . torsemide (DEMADEX) 20 MG tablet Take 2 tablets (40 mg total) every morning, and 1 tablet (20 mg) every evening. Hold for  weight < 222 and call HF clinic. 100 tablet 6  . traZODone (DESYREL) 50 MG tablet Take 50 mg by mouth at bedtime.  5   No current facility-administered medications for this visit.     Allergies:   Patient has no known allergies.   Social History:  The patient  reports that he quit smoking about 13 years ago. His smoking use included cigarettes. He started smoking about 43 years ago. He has a 15.00 pack-year smoking history. he has never used smokeless tobacco. He reports that he drinks alcohol. He reports that he does not use drugs.   Family History:  The patient's  family history includes Alzheimer's disease in his mother; Other in his father; Tuberculosis in his father.    ROS:  Please see the history of present illness.  Otherwise, review of systems is positive for easy bruising and balance problems.  All other systems are reviewed and negative.    PHYSICAL EXAM: VS:  BP (!) 110/50   Pulse 70   Ht 6\' 2"  (1.88 m)   Wt 227 lb (103 kg)   BMI 29.15 kg/m  , BMI Body mass index is 29.15 kg/m. GEN: Well nourished, well developed, pleasant elderly male in no acute distress  HEENT: normal  Neck: no JVD, no masses. No carotid bruits Cardiac: RRR with grade 2/6 systolic ejection murmur at the right upper sternal border               Respiratory:  clear to auscultation bilaterally, normal work of breathing GI: soft, nontender, nondistended, + BS MS: no deformity or atrophy  Ext: no pretibial edema, pedal pulses 2+= bilaterally Skin: warm and dry, no rash Neuro:  Strength and sensation are intact Psych: euthymic mood, full affect  EKG:  EKG is not ordered today.  Recent Labs: 07/27/2017: TSH 2.098 08/07/2017: ALT 29; B Natriuretic Peptide 351.7; Hemoglobin 10.3; Platelets 213 08/10/2017: Magnesium 1.8 08/14/2017: BUN 26; Creatinine, Ser 1.42; Potassium 3.6; Sodium 140   Lipid Panel  No results found for: CHOL, TRIG, HDL, CHOLHDL, VLDL, LDLCALC, LDLDIRECT    Wt Readings from Last  3 Encounters:  09/03/17 227 lb (103 kg)  08/14/17 228 lb (103.4 kg)  08/10/17 226 lb 4.8 oz (102.6 kg)     Cardiac Studies Reviewed: 2D echocardiogram July 28, 2017: Study Conclusions  - Procedure narrative: Transthoracic echocardiography. Image   quality was poor. The study was technically difficult, as a   result of poor acoustic windows, poor sound wave transmission,   and body habitus. - Left ventricle: The cavity size was normal. Wall thickness was   increased in a pattern of moderate LVH. Systolic function was   normal. The estimated ejection fraction  was in the range of 50%   to 55%. Wall motion was normal; there were no regional wall   motion abnormalities. - Aortic valve: Mildly calcified annulus. - Mitral valve: Mildly calcified annulus. Mildly thickened, mildly   calcified leaflets . Valve area by continuity equation (using   LVOT flow): 1.12 cm^2. - Right ventricle: Systolic function was mildly reduced. - Right atrium: The atrium was mildly dilated. - Pulmonary arteries: Systolic pressure was moderately increased.   PA peak pressure: 51 mm Hg (S).  ASSESSMENT AND PLAN: 1.  Chronic diastolic heart failure, New York Heart Association functional class II-III symptoms.  The patient has exertional dyspnea related to combination of COPD and diastolic heart failure.  He is off of oxygen and maintaining good saturations.  He does not appear volume overloaded on exam.  He will continue on torsemide at current dosing.  Discussed outpatient heart failure management including sodium restriction, fluid restriction, and daily weights.  He will continue to follow up in the heart failure clinic.  I will plan to see him back in about 3 months.  2.  Coronary artery disease, native vessel, without angina: Continue aspirin and atorvastatin.  3.  Aortic valve disease status post bioprosthetic aortic valve replacement: Most recent echocardiogram is reviewed and demonstrates normal function  of his aortic valve bioprosthesis.  4.  Paroxysmal atrial fibrillation: Treated with amiodarone.  Not a candidate for anticoagulation.  5. SSS: s/p PPM - followed in EP clinic  Current medicines are reviewed with the patient today.  The patient does not have concerns regarding medicines.  Labs/ tests ordered today include:  No orders of the defined types were placed in this encounter.   Disposition:   FU 3 months  Signed, Alex Bollman, MD  09/03/2017 10:36 AM    [facility] [address] [place], [place], [place]  [address] Phone: [phone]; Fax: [phone]

## 2017-09-05 ENCOUNTER — Encounter: Payer: Self-pay | Admitting: Cardiovascular Disease

## 2017-09-30 ENCOUNTER — Ambulatory Visit (HOSPITAL_COMMUNITY)
Admission: RE | Admit: 2017-09-30 | Discharge: 2017-09-30 | Disposition: A | Discharge status: Home / Self Care | Payer: Medicare Other | Source: Ambulatory Visit | Attending: Internal Medicine | Admitting: Internal Medicine

## 2017-09-30 ENCOUNTER — Other Ambulatory Visit: Payer: Self-pay

## 2017-09-30 ENCOUNTER — Encounter (HOSPITAL_COMMUNITY): Payer: Self-pay | Admitting: Internal Medicine

## 2017-09-30 VITALS — BP 128/78 | HR 64 | Wt 230.4 lb

## 2017-09-30 DIAGNOSIS — I495 Sick sinus syndrome: Secondary | ICD-10-CM | POA: Diagnosis not present

## 2017-09-30 DIAGNOSIS — I482 Chronic atrial fibrillation, unspecified: Secondary | ICD-10-CM

## 2017-09-30 DIAGNOSIS — Z7982 Long term (current) use of aspirin: Secondary | ICD-10-CM | POA: Insufficient documentation

## 2017-09-30 DIAGNOSIS — Z79899 Other long term (current) drug therapy: Secondary | ICD-10-CM | POA: Insufficient documentation

## 2017-09-30 DIAGNOSIS — N189 Chronic kidney disease, unspecified: Secondary | ICD-10-CM | POA: Diagnosis not present

## 2017-09-30 DIAGNOSIS — E785 Hyperlipidemia, unspecified: Secondary | ICD-10-CM | POA: Diagnosis not present

## 2017-09-30 DIAGNOSIS — G4733 Obstructive sleep apnea (adult) (pediatric): Secondary | ICD-10-CM | POA: Insufficient documentation

## 2017-09-30 DIAGNOSIS — Z953 Presence of xenogenic heart valve: Secondary | ICD-10-CM | POA: Diagnosis not present

## 2017-09-30 DIAGNOSIS — F419 Anxiety disorder, unspecified: Secondary | ICD-10-CM | POA: Insufficient documentation

## 2017-09-30 DIAGNOSIS — Z951 Presence of aortocoronary bypass graft: Secondary | ICD-10-CM | POA: Insufficient documentation

## 2017-09-30 DIAGNOSIS — I5032 Chronic diastolic (congestive) heart failure: Secondary | ICD-10-CM | POA: Diagnosis not present

## 2017-09-30 DIAGNOSIS — J449 Chronic obstructive pulmonary disease, unspecified: Secondary | ICD-10-CM | POA: Insufficient documentation

## 2017-09-30 DIAGNOSIS — Z87891 Personal history of nicotine dependence: Secondary | ICD-10-CM | POA: Insufficient documentation

## 2017-09-30 DIAGNOSIS — J9611 Chronic respiratory failure with hypoxia: Secondary | ICD-10-CM | POA: Diagnosis not present

## 2017-09-30 DIAGNOSIS — I2510 Atherosclerotic heart disease of native coronary artery without angina pectoris: Secondary | ICD-10-CM | POA: Diagnosis not present

## 2017-09-30 DIAGNOSIS — G4700 Insomnia, unspecified: Secondary | ICD-10-CM | POA: Diagnosis not present

## 2017-09-30 DIAGNOSIS — I350 Nonrheumatic aortic (valve) stenosis: Secondary | ICD-10-CM | POA: Insufficient documentation

## 2017-09-30 LAB — BASIC METABOLIC PANEL
Anion gap: 11 (ref 5–15)
BUN: 29 mg/dL — AB (ref 6–20)
CO2: 29 mmol/L (ref 22–32)
Calcium: 9 mg/dL (ref 8.9–10.3)
Chloride: 98 mmol/L — ABNORMAL LOW (ref 101–111)
Creatinine, Ser: 1.48 mg/dL — ABNORMAL HIGH (ref 0.61–1.24)
GFR calc Af Amer: 48 mL/min — ABNORMAL LOW (ref >60)
GFR, EST NON AFRICAN AMERICAN: 42 mL/min — AB (ref >60)
GLUCOSE: 81 mg/dL (ref 65–99)
POTASSIUM: 3.8 mmol/L (ref 3.5–5.1)
Sodium: 138 mmol/L (ref 135–145)

## 2017-09-30 NOTE — Patient Instructions (Signed)
Labs today  Keep appointment with Dr Excell Seltzerooper in February   Follow up with us AS NEEDED

## 2017-09-30 NOTE — Progress Notes (Signed)
Advanced Heart Failure Clinic Note   Primary Cardiologist: Excell Seltzerooper  HPI: Alex [name] is a 83 y.o. male with chronic afib, s/p bioprosthetic AVR, CAD s/p CABG, sick sinus s/p ppm, SDH 11/2016, and diastolic CHF.   [facility]07/17 with worsening SOB and found to be volume overloaded. Admitted for IV diuresis and CHF consult. Pt diuresed well on IV lasix and transitioned to torsemide (from lasix PTA) for discharge. Hypokalemia due to diuresis treated with supplementation. Foley catheter placed for aggressive diuresis and pt passed Void test prior to discharge. Diuresed 14 pounds. Discharge weight was 226 pounds.   Pt was also found to need O2 with ambulation, so [facility] and [facility] Management.   Pt presents today for regular follow up. Feeling great overall. Has not required his O2 at home, but not following pulse ox. Denies DOE.  Trying to watch fluid and salt.  Weight at home 225 lbs and stable. Hasn't needed any extra torsemide. No orthopnea or PND. Remains active.   Review of systems complete and found to be negative unless listed in HPI.    Past Medical History:  Diagnosis Date  . Anxiety   . Aortic stenosis    s/p AVR by Dr Laneta SimmersBartle  . COPD (chronic obstructive pulmonary disease) (HCC)   . History of coronary artery disease    status post stenting of the marginal circumflex in 12/2003 and again in 2009  . Hyperlipidemia   . Insomnia   . Morbid obesity (HCC)    weight 243 pounds, BMI 31.2kg/m2, BSA 2.36 square meters  . Obstructive sleep apnea    compliant with CPAP  . Persistent atrial fibrillation (HCC)   . Seizure (HCC)   . Traumatic subdural hematoma without loss of consciousness (HCC)    Current Outpatient Medications  Medication Sig Dispense Refill  . ALPRAZolam (XANAX) 0.25 MG tablet Take 1 tablet (0.25 mg total) by mouth at bedtime. 6 tablet 0  . amiodarone (PACERONE) 200 MG tablet Take 1 tablet (200 mg total) by mouth daily. 60  tablet 3  . aspirin EC 81 MG tablet Take 81 mg daily by mouth.    Marland Kitchen. atorvastatin (LIPITOR) 80 MG tablet TAKE 1 TABLET BY MOUTH DAILY (Patient taking differently: TAKE 1 TABLET (80mg ) BY MOUTH DAILY) 90 tablet 1  . escitalopram (LEXAPRO) 5 MG tablet Take 1 tablet (5 mg total) by mouth at bedtime.    Marland Kitchen. lactulose (CHRONULAC) 10 GM/15ML solution Take 30 mLs (20 g total) by mouth daily. 240 mL 0  . levETIRAcetam (KEPPRA) 500 MG tablet Take 1 tablet (500 mg total) by mouth 2 (two) times daily. 60 tablet 0  . magnesium oxide (MAG-OX) 400 (241.3 Mg) MG tablet Take 0.5 tablets (200 mg total) by mouth daily.    . potassium chloride (K-DUR,KLOR-CON) 10 MEQ tablet Take 1 tablet (10 mEq total) by mouth 2 (two) times daily. 60 tablet 3  . PROAIR HFA 108 (90 Base) MCG/ACT inhaler Inhale 2 puffs into the lungs every 6 (six) hours as needed for shortness of breath. wheezing  11  . torsemide (DEMADEX) 20 MG tablet Take 2 tablets (40 mg total) every morning, and 1 tablet (20 mg) every evening. Hold for weight < 222 and call HF clinic. 100 tablet 6  . traZODone (DESYREL) 50 MG tablet Take 50 mg by mouth at bedtime.  5   No current facility-administered medications for this encounter.    No Known Allergies  Social  History   Socioeconomic History  . Marital status: Married    Spouse name: Not on file  . Number of children: 2  . Years of education: 8th grade  . Highest education level: Not on file  Social Needs  . Financial resource strain: Not on file  . Food insecurity - worry: Not on file  . Food insecurity - inability: Not on file  . Transportation needs - medical: Not on file  . Transportation needs - non-medical: Not on file  Occupational History  . Occupation: Retired  Tobacco Use  . Smoking status: Former Smoker    Packs/day: 0.50    Years: 30.00    Pack years: 15.00    Types: Cigarettes    Start date: 10/27/1973    Last attempt to quit: 10/28/2003    Years since quitting: 13.9  . Smokeless  tobacco: Never Used  Substance and Sexual Activity  . Alcohol use: Yes    Alcohol/week: 0.0 oz    Comment: 3 glasses of wine per evening  . Drug use: No  . Sexual activity: Not on file  Other Topics Concern  . Not on file  Social History Narrative   Lives in [place] with spouse.  Owns a Designer, industrial/product.   Right-handed.   1 cup coffee per day.   Family History  Problem Relation Age of Onset  . Alzheimer's disease Mother   . Tuberculosis Father   . Other Father        Spinal Meningitis   Vitals:   09/30/17 1151  BP: 128/78  Pulse: 64  SpO2: 96%  Weight: 230 lb 6.4 oz (104.5 kg)    Wt Readings from Last 3 Encounters:  09/30/17 230 lb 6.4 oz (104.5 kg)  09/03/17 227 lb (103 kg)  08/14/17 228 lb (103.4 kg)    PHYSICAL EXAM: General: Well appearing. No resp difficulty. HEENT: Normal Neck: Supple. JVP 5-6. Carotids 2+ bilat; + bruits. No thyromegaly or nodule noted. Cor: PMI nondisplaced. RRR, 2/6 AS murmur. s2 crisp Lungs: CTAB, normal effort. Abdomen: Soft, non-tender, non-distended, no HSM. No bruits or masses. +BS  Extremities: No cyanosis, clubbing, or rash. R and LLE no edema.  Neuro: Alert & orientedx3, cranial nerves grossly intact. moves all 4 extremities w/o difficulty. Affect pleasant   ASSESSMENT & PLAN:  1.Chronic Diastolic Heart Failure - Echo: 07/28/17 EF 50-55%  - NYHA II - Volume status stable on exam.  - Continue torsemide 40 mg q am and 20 mg q pm. Can take extra 20 mg in the evening as needed - Reinforced fluid restriction to < 2 L daily, sodium restriction to less than 2000 mg daily, and the importance of daily weights.    2. Chronic hypoxic respiratory failure - Encouraged to wear O2 as needed with exertion. Pulse ox stable to continue wearing Home O2 (Occasionaly forgets).   2. CAD - CABG -LHC 2014 with patent grafts. -No s/s of ischemia.    - Continue ASA  3. Chronic A Fib - Rate controlled on po amiodarone.  - not on  anticoagulation with history of subdural hematoma.   4. S/P AVR Bioprosthetic 2012  - Stable Echo 07/28/2017.   5. H.O Subdural Hematoma 11/2016 - Stable.   6. SSS with PPM  - Stable.   7. CKD  - Creatinine 1.42 08/14/17.  - BMET today.   Doing well overall. Volume status stable. Will graduate from the HF clinic. Can follow up as needed. Will need 6  month follow up with Dr. Excell Seltzerooper.   Alex FreerMichael Andrew Tillery, PA-C 09/30/17   Patient seen and examined with the above-signed Advanced Practice Provider and/or Housestaff. I personally reviewed laboratory data, imaging studies and relevant notes. I independently examined the patient and formulated the important aspects of the plan. I have edited the note to reflect any of my changes or salient points. I have personally discussed the plan with the patient and/or family.  Doing well with switch from lasix to torsemide. Volume status very stable. NYHA II. Will check labs. Will have him f/u with Dr. Excell Seltzerooper. Can f/u with HF Clinic as needed if fluid overload recurs.   Arvilla Meresaniel Avriana Joo, MD  10:44 PM

## 2017-10-06 ENCOUNTER — Other Ambulatory Visit (HOSPITAL_COMMUNITY): Payer: Self-pay | Admitting: Internal Medicine

## 2017-10-06 ENCOUNTER — Other Ambulatory Visit: Payer: Self-pay | Admitting: *Deleted

## 2017-10-06 MED ORDER — AMIODARONE HCL 200 MG PO TABS: 200.0000 mg | ORAL_TABLET | Freq: Every day | ORAL | 1 refills | Status: DC

## 2017-10-07 ENCOUNTER — Telehealth (HOSPITAL_COMMUNITY): Payer: Self-pay | Admitting: Cardiology

## 2017-10-07 NOTE — Telephone Encounter (Signed)
VERBAL ORDER GIVEN TO CONTINUE IN HOME PT SERVICES X 8 WEEKS

## 2017-10-12 DIAGNOSIS — I5033 Acute on chronic diastolic (congestive) heart failure: Secondary | ICD-10-CM | POA: Diagnosis not present

## 2017-10-13 ENCOUNTER — Ambulatory Visit: Payer: Medicare Other | Admitting: Neurology

## 2017-10-13 ENCOUNTER — Encounter: Payer: Self-pay | Admitting: Neurology

## 2017-10-13 VITALS — BP 110/65 | HR 90 | Ht 74.0 in | Wt 230.0 lb

## 2017-10-13 DIAGNOSIS — G3184 Mild cognitive impairment, so stated: Secondary | ICD-10-CM

## 2017-10-13 DIAGNOSIS — S065X9A Traumatic subdural hemorrhage with loss of consciousness of unspecified duration, initial encounter: Secondary | ICD-10-CM

## 2017-10-13 DIAGNOSIS — S065XAA Traumatic subdural hemorrhage with loss of consciousness status unknown, initial encounter: Secondary | ICD-10-CM

## 2017-10-13 MED ORDER — DIVALPROEX SODIUM ER 500 MG PO TB24: 500.0000 mg | ORAL_TABLET | Freq: Every day | ORAL | 11 refills | Status: DC

## 2017-10-13 MED ORDER — MEMANTINE HCL 10 MG PO TABS: 10.0000 mg | ORAL_TABLET | Freq: Two times a day (BID) | ORAL | 11 refills | Status: DC

## 2017-10-13 NOTE — Progress Notes (Addendum)
PATIENT: Alex [name] DOB: 10-16-1931  Chief Complaint  Patient presents with  . Seizures    He is here with his wife, Alex [name].  They would like to review his EEG results.  He is doing well on Keppra 500mg , BID.  No further seizure activity reported.   . Memory Loss    MMSE on 07/14/17 was 24/30, naming 9 animals.  He is here as a follow up to that appt to review his CT results.     HISTORICAL  Alex [name] is 83 years old male, seen in refer by his primary care physician Dr. Alben Spittle, Ellin Mayhew. for evaluation of seizure, he is accompanied by her wife Alex [name] at today's clinical visit, initial evaluation was on July 14 2017.  Reviewed and summarized extensive hospital record, he had a past medical history of aortic stenosis status post aortic valve  paroxysmal atrial fibrillation, on chronic Coumadin treatment, status post pacemaker, COPD, coronary artery disease, status post CABG, hyperlipidemia,   He fell in early February, on December 01 2016, he fell at his house again, struck his head on concrete floor, 2 days later on December 03 2016, he complains of severe headaches, was brought into the emergency room, found to have right subdural hematoma, over the next couple weeks, gradually getting worse, eventually require right craniotomy evacuation, there was no clinical seizure, but EEG on December 05 2016 showed moderate diffuse slowing, epileptiform discharge on the right posterior temporal region, lasting 2-4 minutes, he has been treated with Keppra 500 mg twice a day,  He was later discharged for rehabilitation, we hospital admission again in April 2018 for sepsis, prostate abscess, require another round of inpatient rehabilitation, now his back home,  He was a Garment/textile technologist, was noted to have mild memory loss even prior to the incident, now gradually getting worse, he has not driven since February, continue has mild unsteady gait  UPDATE Oct 13 2017: EEG in September 2018 showed no  evidence of epileptiform discharge.  Lab in Nov 2018: BMP, creat 1.42,  Elevated BNP,  Normal TSH, B12 531,  He is overall doing well, no falling, no seizure,  He is the owner the dry wall company, wife noticed that he has gradual worsening memory loss, he is no longer driving His mother and sister have dementia.  Repeat CT head without contrast in Sep 2018: Showed mild hyper density and thickening of the dura of the right frontal and insular region, as well as over bilateral tentorium cerebelli, consistent with mild residual chronic subdural hemorrhage, no mass-effect, right parietal encephalomalacia, right craniotomy postsurgical changes, moderate atrophy, supratentorium small vessel disease,  REVIEW OF SYSTEMS: Full 14 system review of systems performed and notable only for weight gain, fatigue, shortness of breath, decreased energy, memory loss   ALLERGIES: No Known Allergies  HOME MEDICATIONS: Current Outpatient Medications  Medication Sig Dispense Refill  . ALPRAZolam (XANAX) 0.25 MG tablet Take 1 tablet (0.25 mg total) by mouth at bedtime. 6 tablet 0  . amiodarone (PACERONE) 200 MG tablet Take 1 tablet (200 mg total) by mouth daily. 90 tablet 0  . amiodarone (PACERONE) 200 MG tablet Take 1 tablet (200 mg total) by mouth daily. 60 tablet 1  . aspirin EC 81 MG tablet Take 81 mg daily by mouth.    Marland Kitchen atorvastatin (LIPITOR) 80 MG tablet TAKE 1 TABLET BY MOUTH DAILY (Patient taking differently: TAKE 1 TABLET (80mg ) BY MOUTH DAILY) 90 tablet 1  . escitalopram (LEXAPRO) 5  MG tablet Take 1 tablet (5 mg total) by mouth at bedtime.    Marland Kitchen. lactulose (CHRONULAC) 10 GM/15ML solution Take 30 mLs (20 g total) by mouth daily. 240 mL 0  . levETIRAcetam (KEPPRA) 500 MG tablet Take 1 tablet (500 mg total) by mouth 2 (two) times daily. 60 tablet 0  . magnesium oxide (MAG-OX) 400 (241.3 Mg) MG tablet Take 0.5 tablets (200 mg total) by mouth daily.    . potassium chloride (K-DUR,KLOR-CON) 10 MEQ tablet Take  1 tablet (10 mEq total) by mouth 2 (two) times daily. 60 tablet 3  . PROAIR HFA 108 (90 Base) MCG/ACT inhaler Inhale 2 puffs into the lungs every 6 (six) hours as needed for shortness of breath. wheezing  11  . torsemide (DEMADEX) 20 MG tablet Take 2 tablets (40 mg total) every morning, and 1 tablet (20 mg) every evening. Hold for weight < 222 and call HF clinic. 100 tablet 6  . traZODone (DESYREL) 50 MG tablet Take 50 mg by mouth at bedtime.  5   No current facility-administered medications for this visit.     PAST MEDICAL HISTORY: Past Medical History:  Diagnosis Date  . Anxiety   . Aortic stenosis    s/p AVR by Dr Laneta SimmersBartle  . COPD (chronic obstructive pulmonary disease) (HCC)   . History of coronary artery disease    status post stenting of the marginal circumflex in 12/2003 and again in 2009  . Hyperlipidemia   . Insomnia   . Morbid obesity (HCC)    weight 243 pounds, BMI 31.2kg/m2, BSA 2.36 square meters  . Obstructive sleep apnea    compliant with CPAP  . Persistent atrial fibrillation (HCC)   . Seizure (HCC)   . Traumatic subdural hematoma without loss of consciousness (HCC)     PAST SURGICAL HISTORY: Past Surgical History:  Procedure Laterality Date  . AORTIC VALVE REPLACEMENT (AVR)/CORONARY ARTERY BYPASS GRAFTING (CABG)   08/05/2011   LIMA to LAD, sequential saphenous vein graft to third and fourth obtuse marginal branches of the circumflex, aortic valve replacement using a 23 mm Edwards pericardial valve  . APPENDECTOMY    . CARDIOVERSION N/A 11/14/2014   Procedure: CARDIOVERSION;  Surgeon: Donato SchultzMark Skains, MD;  [facility];  Service: Cardiovascular;  Laterality: N/A;  . CARDIOVERSION N/A 11/20/2016   Procedure: CARDIOVERSION;  Surgeon: Pricilla RifflePaula Ross V, MD;  [facility];  Service: Cardiovascular;  Laterality: N/A;  . CAROTID ENDARTERECTOMY     Dr Lollie Sailsale Williams  . CRANIOTOMY Right 12/16/2016   Procedure: CRANIOTOMY HEMATOMA EVACUATION SUBDURAL;  Surgeon:  Loura HaltBenjamin Jared Ditty, MD;  [facility];  Service: Neurosurgery;  Laterality: Right;  . EP IMPLANTABLE DEVICE N/A 11/06/2015   Procedure: PPM Generator Changeout;for sick sinus syndrome with a MDT Adapta L PPM, chronically elevated RV threshold.  . permanent pacemaker     MDT EnRhythm implanted by Dr Lawanda CousinsAl-Kori in [place] for complete heart block with syncope  . REPLACEMENT TOTAL KNEE BILATERAL     2006  . TEE WITHOUT CARDIOVERSION N/A 11/14/2014   Procedure: TRANSESOPHAGEAL ECHOCARDIOGRAM (TEE);  Surgeon: Donato SchultzMark Skains, MD;  [facility];  Service: Cardiovascular;  Laterality: N/A;  . TRANSURETHRAL INCISION OF PROSTATE N/A 01/29/2017   Procedure: TRANSURETHRAL UNROOFING OF A  PROSTATE ABSCESS;  Surgeon: Ihor GullyMark Ottelin, MD;  Location: [facility] ORS;  Service: Urology;  Laterality: N/A;    FAMILY HISTORY: Family History  Problem Relation Age of Onset  . Alzheimer's disease Mother   . Tuberculosis Father   .  Other Father        Spinal Meningitis    SOCIAL HISTORY:  Social History   Socioeconomic History  . Marital status: Married    Spouse name: Not on file  . Number of children: 2  . Years of education: 8th grade  . Highest education level: Not on file  Social Needs  . Financial resource strain: Not on file  . Food insecurity - worry: Not on file  . Food insecurity - inability: Not on file  . Transportation needs - medical: Not on file  . Transportation needs - non-medical: Not on file  Occupational History  . Occupation: Retired  Tobacco Use  . Smoking status: Former Smoker    Packs/day: 0.50    Years: 30.00    Pack years: 15.00    Types: Cigarettes    Start date: 10/27/1973    Last attempt to quit: 10/28/2003    Years since quitting: 13.9  . Smokeless tobacco: Never Used  Substance and Sexual Activity  . Alcohol use: Yes    Alcohol/week: 0.0 oz    Comment: 3 glasses of wine per evening  . Drug use: No  . Sexual activity: Not on file  Other Topics Concern  . Not on  file  Social History Narrative   [place]  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   Right-handed.   1 cup coffee per day.     PHYSICAL EXAM   Vitals:   10/13/17 1222  BP: 110/65  Pulse: 90  Weight: 230 lb (104.3 kg)  Height: 6\' 2"  (1.88 m)    Not recorded      Body mass index is 29.53 kg/m.  PHYSICAL EXAMNIATION:  Gen: NAD, conversant, well nourised, obese, well groomed                     Cardiovascular: Regular rate rhythm, no peripheral edema, warm, nontender. Eyes: Conjunctivae clear without exudates or hemorrhage Neck: Supple, no carotid bruits. Pulmonary: Clear to auscultation bilaterally   NEUROLOGICAL EXAM: MMSE - Mini Mental State Exam 10/13/2017 07/14/2017  Orientation to time 3 2  Orientation to Place 3 5  Registration 3 3  Attention/ Calculation 3 4  Recall 1 1  Language- name 2 objects 2 2  Language- repeat 1 1  Language- follow 3 step command 3 3  Language- read & follow direction 1 1  Write a sentence 1 1  Copy design 0 1  Total score 21 24   CRANIAL NERVES: CN II: Visual fields are full to confrontation. Fundoscopic exam is normal with sharp discs and no vascular changes. Pupils are round equal and briskly reactive to light. CN III, IV, VI: extraocular movement are normal. No ptosis. CN V: Facial sensation is intact to pinprick in all 3 divisions bilaterally. Corneal responses are intact.  CN VII: Face is symmetric with normal eye closure and smile. CN VIII: Hearing is normal to rubbing fingers CN IX, X: Palate elevates symmetrically. Phonation is normal. CN XI: Head turning and shoulder shrug are intact CN XII: Tongue is midline with normal movements and no atrophy.  MOTOR: There is no pronator drift of out-stretched arms. Muscle bulk and tone are normal. Muscle strength is normal.  REFLEXES: Reflexes are 2+ and symmetric at the biceps, triceps, knees, and ankles. Plantar responses are flexor.  SENSORY: Intact to  light touch, pinprick, positional sensation and vibratory sensation are intact in fingers and toes.  COORDINATION: Rapid alternating  movements and fine finger movements are intact. There is no dysmetria on finger-to-nose and heel-knee-shin.    GAIT/STANCE: He needs push up to get up from seated position, cautious, mildly unsteady.   DIAGNOSTIC DATA (LABS, IMAGING, TESTING) - I reviewed patient records, labs, notes, testing and imaging myself where available.   ASSESSMENT AND PLAN  Alex [name] is a 83 y.o. male   History of right subdural hematoma in February 2018 Abnormal EEG, evidence of epileptiform discharges Mild cognitive impairment with agitation  Repeat CT head without contrast in Sep 2018: Showed mild hyper density and thickening of the dura of the right frontal and insular region, as well as over bilateral tentorium cerebelli, consistent with mild residual chronic subdural hemorrhage, no mass-effect, right parietal encephalomalacia, right craniotomy postsurgical changes, moderate atrophy, supratentorium small vessel disease,     Laboratory evaluation showed no treatable cause of memory loss  Keppra can potentially worsening his agitation, will switch to Depakote ER 500 mg every night  Add on Namenda 10 mg twice daily, continue physical therapy, encouraged him moderate exercise daily  I also had lengthy discussion with patient and his wife, with his gradual worsening memory loss, visual spatial deficit, I have advised him to stop driving  Alex [name], M.D. Ph.D.  [facility] [address] [place], [place] [address] Ph: [phone] Fax: [phone]  CC:  [name]., MD

## 2017-10-14 ENCOUNTER — Other Ambulatory Visit: Payer: Self-pay | Admitting: *Deleted

## 2017-10-14 ENCOUNTER — Telehealth: Payer: Self-pay | Admitting: Neurology

## 2017-10-14 MED ORDER — DIVALPROEX SODIUM ER 500 MG PO TB24: 500.0000 mg | ORAL_TABLET | Freq: Every day | ORAL | 3 refills | Status: DC

## 2017-10-14 MED ORDER — MEMANTINE HCL 10 MG PO TABS: 10.0000 mg | ORAL_TABLET | Freq: Two times a day (BID) | ORAL | 3 refills | Status: DC

## 2017-10-14 NOTE — Telephone Encounter (Signed)
90 day prescriptions sent to pharmacy

## 2017-10-14 NOTE — Telephone Encounter (Signed)
Patient's wife requesting divalproex (DEPAKOTE ER) 500 MG 24 hr tablet #90 and cmemantine (NAMENDA) 10 MG tablet #90 called to [facility] on [address] [place]

## 2017-10-27 ENCOUNTER — Other Ambulatory Visit (HOSPITAL_COMMUNITY): Payer: Self-pay | Admitting: Internal Medicine

## 2017-11-02 ENCOUNTER — Ambulatory Visit (INDEPENDENT_AMBULATORY_CARE_PROVIDER_SITE_OTHER): Payer: Medicare Other | Admitting: *Deleted

## 2017-11-02 DIAGNOSIS — I495 Sick sinus syndrome: Secondary | ICD-10-CM | POA: Diagnosis not present

## 2017-11-03 ENCOUNTER — Encounter: Payer: Self-pay | Admitting: Cardiology

## 2017-11-03 LAB — CUP PACEART REMOTE DEVICE CHECK
Battery Impedance: 158 Ohm
Battery Voltage: 2.79 V
Brady Statistic AP VS Percent: 0 %
Brady Statistic AS VP Percent: 71 %
Date Time Interrogation Session: 20190107203217
Implantable Lead Implant Date: 20061113
Implantable Lead Location: 753860
Implantable Lead Model: 5076
Implantable Lead Model: 5594
Lead Channel Impedance Value: 379 Ohm
Lead Channel Setting Pacing Amplitude: 2 V
Lead Channel Setting Pacing Amplitude: 4 V
Lead Channel Setting Sensing Sensitivity: 2 mV
MDC IDC LEAD IMPLANT DT: 20061113
MDC IDC LEAD LOCATION: 753859
MDC IDC MSMT BATTERY REMAINING LONGEVITY: 92 mo
MDC IDC MSMT LEADCHNL RA PACING THRESHOLD AMPLITUDE: 0.625 V
MDC IDC MSMT LEADCHNL RA PACING THRESHOLD PULSEWIDTH: 0.4 ms
MDC IDC MSMT LEADCHNL RV IMPEDANCE VALUE: 1818 Ohm
MDC IDC PG IMPLANT DT: 20170110
MDC IDC SET LEADCHNL RV PACING PULSEWIDTH: 1.5 ms
MDC IDC STAT BRADY AP VP PERCENT: 6 %
MDC IDC STAT BRADY AS VS PERCENT: 24 %

## 2017-11-03 NOTE — Progress Notes (Signed)
Remote pacemaker transmission.   

## 2017-11-10 ENCOUNTER — Telehealth: Payer: Self-pay | Admitting: Neurology

## 2017-11-10 MED ORDER — MEMANTINE HCL 10 MG PO TABS: 10.0000 mg | ORAL_TABLET | Freq: Two times a day (BID) | ORAL | 3 refills | Status: AC

## 2017-11-10 MED ORDER — DIVALPROEX SODIUM ER 500 MG PO TB24: 500.0000 mg | ORAL_TABLET | Freq: Every day | ORAL | 3 refills | Status: DC

## 2017-11-10 MED FILL — POTASSIUM CHLORIDE 10MEQ ER: 10 | 30 days supply | Qty: 60 | Fill #0

## 2017-11-10 NOTE — Telephone Encounter (Signed)
Refills sent to her requested pharmacy.

## 2017-11-10 NOTE — Telephone Encounter (Signed)
Pts wife called requesting a 90 day refill for memantine (NAMENDA) 10 MG tablet and divalproex (DEPAKOTE ER) 500 MG 24 hr tablet sent to [facility] [place] [facility] - [place], [place] - [address] Any questions contact at [phone]

## 2017-11-13 ENCOUNTER — Telehealth: Payer: Self-pay

## 2017-11-13 MED ORDER — AMIODARONE HCL 200 MG PO TABS: 200.0000 mg | ORAL_TABLET | Freq: Two times a day (BID) | ORAL | 3 refills | Status: AC

## 2017-11-13 NOTE — Telephone Encounter (Signed)
Patient requests amiodarone called in to [facility]. Rx sent. Patient was grateful for call.

## 2017-12-09 MED FILL — PROAIR HFA 90 MCG INHALER: 108 (90 BAS | 25 days supply | Qty: 9 | Fill #0

## 2017-12-09 MED FILL — MAGNESIUM OXIDE 400 MG TAB: 400 (240 MG | 120 days supply | Qty: 120 | Fill #0

## 2017-12-09 MED FILL — ESCITALOPRAM 10 MG TABLET: 10 | 30 days supply | Qty: 30 | Fill #0

## 2017-12-09 MED FILL — levETIRAcetam 500 MG TABS: 500 | 90 days supply | Qty: 180 | Fill #0

## 2017-12-09 MED FILL — POTASSIUM CL ER 10 MEQ TAB: 10 | 90 days supply | Qty: 180 | Fill #0

## 2017-12-11 ENCOUNTER — Ambulatory Visit: Payer: Medicare Other | Admitting: Cardiovascular Disease

## 2017-12-11 ENCOUNTER — Encounter: Payer: Self-pay | Admitting: Cardiovascular Disease

## 2017-12-11 VITALS — BP 112/52 | HR 67 | Ht 74.0 in | Wt 242.0 lb

## 2017-12-11 DIAGNOSIS — I482 Chronic atrial fibrillation: Secondary | ICD-10-CM

## 2017-12-11 DIAGNOSIS — I5032 Chronic diastolic (congestive) heart failure: Secondary | ICD-10-CM | POA: Diagnosis not present

## 2017-12-11 DIAGNOSIS — I4821 Permanent atrial fibrillation: Secondary | ICD-10-CM

## 2017-12-11 NOTE — Progress Notes (Signed)
Cardiology Office Note Date:  12/11/2017   ID:  Alex DuhamelJohn R [name], DOB 03/07/1931, MRN [id number]  PCP:  Alex MilesWeaver, Alex W., MD  Cardiologist:  Alex BollmanMichael Mansur Patti, MD    Chief Complaint  Patient presents with  . Shortness of Breath   History of Present Illness: Alex [name] is a 84 y.o. male who presents for follow-up evaluation.  The patient has a history of atrial fibrillation, bioprosthetic aortic valve replacement, coronary artery disease status post CABG, sick sinus syndrome status post permanent pacemaker placement, and subdural hematoma in 2018 with long complicated recovery and extended rehabilitation.  The patient developed progressive symptoms of diastolic heart failure and volume overload in fall 2018 and was hospitalized for IV diuresis.  And advanced heart failure consult was obtained as the patient had recurrent hospitalization for heart failure.  He was transitioned to oral torsemide and has been followed closely since that time.  He was last seen here in November 2018 and in the heart failure clinic December 2018.  He is here with his wife today.  He reports no change in symptoms.  Shortness of breath is stable.  No orthopnea, PND, or leg swelling.  No chest pain.  He has not been following a diet or watching his salt intake.  Past Medical History:  Diagnosis Date  . Anxiety   . Aortic stenosis    s/p AVR by Dr Laneta SimmersBartle  . COPD (chronic obstructive pulmonary disease) (HCC)   . History of coronary artery disease    status post stenting of the marginal circumflex in 12/2003 and again in 2009  . Hyperlipidemia   . Insomnia   . Morbid obesity (HCC)    weight 243 pounds, BMI 31.2kg/m2, BSA 2.36 square meters  . Obstructive sleep apnea    compliant with CPAP  . Persistent atrial fibrillation (HCC)   . Seizure (HCC)   . Traumatic subdural hematoma without loss of consciousness [facility](HCC)     Past Surgical History:  Procedure Laterality Date  . AORTIC VALVE REPLACEMENT (AVR)/CORONARY  ARTERY BYPASS GRAFTING (CABG)   08/05/2011   LIMA to LAD, sequential saphenous vein graft to third and fourth obtuse marginal branches of the circumflex, aortic valve replacement using a 23 mm Edwards pericardial valve  . APPENDECTOMY    . CARDIOVERSION N/A 11/14/2014   Procedure: CARDIOVERSION;  Surgeon: Donato SchultzMark Skains, MD;  [facility];  Service: Cardiovascular;  Laterality: N/A;  . CARDIOVERSION N/A 11/20/2016   Procedure: CARDIOVERSION;  Surgeon: Pricilla RifflePaula Ross V, MD;  [facility];  Service: Cardiovascular;  Laterality: N/A;  . CAROTID ENDARTERECTOMY     Dr Lollie Sailsale Williams  . CRANIOTOMY Right 12/16/2016   Procedure: CRANIOTOMY HEMATOMA EVACUATION SUBDURAL;  Surgeon: Loura HaltBenjamin Jared Ditty, MD;  [facility];  Service: Neurosurgery;  Laterality: Right;  . EP IMPLANTABLE DEVICE N/A 11/06/2015   Procedure: PPM Generator Changeout;for sick sinus syndrome with a MDT Adapta L PPM, chronically elevated RV threshold.  . permanent pacemaker     MDT EnRhythm implanted by Dr Lawanda CousinsAl-Kori in [place] for complete heart block with syncope  . REPLACEMENT TOTAL KNEE BILATERAL     2006  . TEE WITHOUT CARDIOVERSION N/A 11/14/2014   Procedure: TRANSESOPHAGEAL ECHOCARDIOGRAM (TEE);  Surgeon: Donato SchultzMark Skains, MD;  [facility];  Service: Cardiovascular;  Laterality: N/A;  . TRANSURETHRAL INCISION OF PROSTATE N/A 01/29/2017   Procedure: TRANSURETHRAL UNROOFING OF A  PROSTATE ABSCESS;  Surgeon: Ihor GullyMark Ottelin, MD;  Location: [facility] ORS;  Service: Urology;  Laterality: N/A;  Current Outpatient Medications  Medication Sig Dispense Refill  . amiodarone (PACERONE) 200 MG tablet Take 1 tablet (200 mg total) by mouth 2 (two) times daily. 180 tablet 3  . aspirin EC 81 MG tablet Take 81 mg daily by mouth.    Marland Kitchen atorvastatin (LIPITOR) 80 MG tablet TAKE 1 TABLET BY MOUTH DAILY (Patient taking differently: TAKE 1 TABLET (80mg ) BY MOUTH DAILY) 90 tablet 1  . divalproex (DEPAKOTE ER) 500 MG 24 hr tablet Take 1  tablet (500 mg total) by mouth at bedtime. 90 tablet 3  . escitalopram (LEXAPRO) 5 MG tablet Take 1 tablet (5 mg total) by mouth at bedtime.    Marland Kitchen lactulose (CHRONULAC) 10 GM/15ML solution Take 30 mLs (20 g total) by mouth daily. 240 mL 0  . magnesium oxide (MAG-OX) 400 (241.3 Mg) MG tablet Take 0.5 tablets (200 mg total) by mouth daily.    . memantine (NAMENDA) 10 MG tablet Take 1 tablet (10 mg total) by mouth 2 (two) times daily. 180 tablet 3  . potassium chloride (K-DUR,KLOR-CON) 10 MEQ tablet Take 1 tablet (10 mEq total) by mouth 2 (two) times daily. 60 tablet 3  . PROAIR HFA 108 (90 Base) MCG/ACT inhaler Inhale 2 puffs into the lungs every 6 (six) hours as needed for shortness of breath. wheezing  11  . torsemide (DEMADEX) 20 MG tablet Take 2 tablets (40 mg total) every morning, and 1 tablet (20 mg) every evening. Hold for weight < 222 and call HF clinic. 100 tablet 6  . traZODone (DESYREL) 50 MG tablet Take 50 mg by mouth at bedtime.  5   No current facility-administered medications for this visit.     Allergies:   Patient has no known allergies.   Social History:  The patient  reports that he quit smoking about 14 years ago. His smoking use included cigarettes. He started smoking about 44 years ago. He has a 15.00 pack-year smoking history. he has never used smokeless tobacco. He reports that he drinks alcohol. He reports that he does not use drugs.   Family History:  The patient's family history includes Alzheimer's disease in his mother; Other in his father; Tuberculosis in his father.   ROS:  Please see the history of present illness.  All other systems are reviewed and negative.   PHYSICAL EXAM: VS:  BP (!) 112/52   Pulse 67   Ht 6\' 2"  (1.88 m)   Wt 242 lb (109.8 kg)   BMI 31.07 kg/m  , BMI Body mass index is 31.07 kg/m. GEN: Pleasant elderly male, in no acute distress  HEENT: normal  Neck: no JVD, no masses. No carotid bruits Cardiac: RRR without murmur or gallop                 Respiratory:  clear to auscultation bilaterally, normal work of breathing GI: soft, nontender, nondistended, + BS MS: no deformity or atrophy  Ext: no pretibial edema Skin: warm and dry, no rash Neuro:  Strength and sensation are intact Psych: euthymic mood, full affect  EKG:  EKG is not ordered today.  Recent Labs: 07/27/2017: TSH 2.098 08/07/2017: ALT 29; B Natriuretic Peptide 351.7; Hemoglobin 10.3; Platelets 213 08/10/2017: Magnesium 1.8 09/30/2017: BUN 29; Creatinine, Ser 1.48; Potassium 3.8; Sodium 138   Lipid Panel  No results found for: CHOL, TRIG, HDL, CHOLHDL, VLDL, LDLCALC, LDLDIRECT    Wt Readings from Last 3 Encounters:  12/11/17 242 lb (109.8 kg)  10/13/17 230 lb (104.3 kg)  09/30/17  230 lb 6.4 oz (104.5 kg)     Cardiac Studies Reviewed: Echo 07-28-2017: Study Conclusions  - Procedure narrative: Transthoracic echocardiography. Image   quality was poor. The study was technically difficult, as a   result of poor acoustic windows, poor sound wave transmission,   and body habitus. - Left ventricle: The cavity size was normal. Wall thickness was   increased in a pattern of moderate LVH. Systolic function was   normal. The estimated ejection fraction was in the range of 50%   to 55%. Wall motion was normal; there were no regional wall   motion abnormalities. - Aortic valve: Mildly calcified annulus. - Mitral valve: Mildly calcified annulus. Mildly thickened, mildly   calcified leaflets . Valve area by continuity equation (using   LVOT flow): 1.12 cm^2. - Right ventricle: Systolic function was mildly reduced. - Right atrium: The atrium was mildly dilated. - Pulmonary arteries: Systolic pressure was moderately increased.   PA peak pressure: 51 mm Hg (S).  ASSESSMENT AND PLAN: 1.  Chronic diastolic heart failure, New York Heart Association functional class II symptoms: The patient's weight is increased by 12 pounds.  He does not appear fluid overloaded and  there is no change in his symptoms.  I do think he has had significant fluid and sodium indiscretion.  Reviewed the importance of daily weights and adherence to a diet as well as fluid restriction.  We wrote out a sliding scale for his torsemide so that if his weight increases 3 pounds from baseline at home, he would increase torsemide to 40 mg twice daily.  If his weight increases to 5 pounds from baseline on his home scale he would contact us for further instructions.  Otherwise he will continue with his current medications.  2.  Chronic hypoxic respiratory failure, likely secondary to COPD and diastolic heart failure.  Patient has home O2 arranged but with minimal symptoms is not using it much.  3.  Coronary artery disease status post CABG: No anginal symptoms.  Cardiac catheterization from 2014 reviewed.  4.  Persistent atrial fibrillation: The patient is off of anticoagulation because of his history of spontaneous subdural hematoma.  He is been rate controlled with amiodarone  5.  Aortic valve disease status post bioprosthetic aortic valve replacement: The patient has had normal function by echo criteria last echo study within the previous 12 months.  6.  Chronic kidney disease, stage III: Most recent labs reviewed with stable creatinine (1.4).  Current medicines are reviewed with the patient today.  The patient does not have concerns regarding medicines.  Labs/ tests ordered today include:  No orders of the defined types were placed in this encounter.   Disposition:   FU 6 months  Signed, Alex Bollman, MD  12/11/2017 7:12 PM    [facility] [address] [place], [place], [place]  [address] Phone: [phone]; Fax: [phone]

## 2017-12-11 NOTE — Patient Instructions (Addendum)
Medication Instructions:  If you gain 3 pounds from your baseline weight at home, you may increase your Torsemide to 2 pills twice that day. If you gain 5 pounds from baseline weight, please call us!  Labwork: You will have labs when you return for your 3 month appointment.  Testing/Procedures: None  Follow-Up: Your provider recommends that you schedule a follow-up appointment in 3 months with Tereso NewcomerScott Weaver, PA.  Your provider wants you to follow-up in: 6 months with Dr. Excell Seltzerooper. You will receive a reminder letter in the mail two months in advance. If you don't receive a letter, please call our office to schedule the follow-up appointment.    Any Other Special Instructions Will Be Listed Below (If Applicable).     If you need a refill on your cardiac medications before your next appointment, please call your pharmacy.

## 2017-12-14 MED FILL — ATORVASTATIN 80 MG TABLET: 80 | 90 days supply | Qty: 90 | Fill #0

## 2017-12-14 MED FILL — traZODone HCL 50 MG TABS: 50 | 90 days supply | Qty: 90 | Fill #0

## 2017-12-16 MED FILL — CEPHALEXIN 500 MG CAPSULE: 500 | 1 days supply | Qty: 3 | Fill #0

## 2018-01-07 MED FILL — ESCITALOPRAM 10 MG TABLET: 10 | 60 days supply | Qty: 60 | Fill #1

## 2018-02-01 ENCOUNTER — Telehealth: Payer: Self-pay | Admitting: Cardiology

## 2018-02-01 ENCOUNTER — Encounter: Payer: Medicare Other | Admitting: *Deleted

## 2018-02-01 NOTE — Telephone Encounter (Signed)
Attempted to confirm remote transmission with pt. No answer and was unable to leave a message.   

## 2018-02-02 MED FILL — POTASSIUM CHLORIDE 10MEQ ER: 10 | 30 days supply | Qty: 60 | Fill #1

## 2018-02-04 ENCOUNTER — Encounter: Payer: Self-pay | Admitting: Cardiology

## 2018-02-08 MED FILL — FUROSEMIDE 20 MG TAB: 20 | 90 days supply | Qty: 360 | Fill #0

## 2018-02-08 MED FILL — MEMANTINE HCL 10 MG TABLET: 10 | 90 days supply | Qty: 180 | Fill #0

## 2018-02-08 MED FILL — BENZONATATE 100 MG CAPSULE: 100 | 7 days supply | Qty: 20 | Fill #0

## 2018-02-08 MED FILL — DIVALPROEX SOD ER 500 MG TA: 500 | 90 days supply | Qty: 90 | Fill #0

## 2018-02-08 MED FILL — DOXYCYCLINE HYCLATE 100 MG: 100 | 10 days supply | Qty: 20 | Fill #0

## 2018-02-18 ENCOUNTER — Ambulatory Visit (INDEPENDENT_AMBULATORY_CARE_PROVIDER_SITE_OTHER): Payer: Medicare Other | Admitting: *Deleted

## 2018-02-18 ENCOUNTER — Encounter: Payer: Self-pay | Admitting: Physician Assistant

## 2018-02-18 DIAGNOSIS — I495 Sick sinus syndrome: Secondary | ICD-10-CM | POA: Diagnosis not present

## 2018-02-19 ENCOUNTER — Encounter: Payer: Self-pay | Admitting: Cardiology

## 2018-02-19 NOTE — Progress Notes (Signed)
Remote pacemaker transmission.   

## 2018-03-03 MED FILL — ESCITALOPRAM 10 MG TABLET: 10 | 60 days supply | Qty: 60 | Fill #2

## 2018-03-08 ENCOUNTER — Ambulatory Visit: Payer: Medicare Other | Admitting: Physician Assistant

## 2018-03-08 ENCOUNTER — Encounter: Payer: Self-pay | Admitting: Physician Assistant

## 2018-03-08 VITALS — BP 118/70 | HR 88 | Ht 74.0 in | Wt 236.4 lb

## 2018-03-08 DIAGNOSIS — I5032 Chronic diastolic (congestive) heart failure: Secondary | ICD-10-CM | POA: Diagnosis not present

## 2018-03-08 DIAGNOSIS — I779 Disorder of arteries and arterioles, unspecified: Secondary | ICD-10-CM

## 2018-03-08 DIAGNOSIS — I25810 Atherosclerosis of coronary artery bypass graft(s) without angina pectoris: Secondary | ICD-10-CM

## 2018-03-08 DIAGNOSIS — I482 Chronic atrial fibrillation: Secondary | ICD-10-CM | POA: Diagnosis not present

## 2018-03-08 DIAGNOSIS — N183 Chronic kidney disease, stage 3 unspecified: Secondary | ICD-10-CM

## 2018-03-08 DIAGNOSIS — Z952 Presence of prosthetic heart valve: Secondary | ICD-10-CM

## 2018-03-08 DIAGNOSIS — I739 Peripheral vascular disease, unspecified: Secondary | ICD-10-CM

## 2018-03-08 DIAGNOSIS — I4821 Permanent atrial fibrillation: Secondary | ICD-10-CM

## 2018-03-08 NOTE — Patient Instructions (Addendum)
Medication Instructions:  1. Your physician recommends that you continue on your current medications as directed. Please refer to the Current Medication list given to you today.   Labwork: NONE ORDERED TODAY  Testing/Procedures: NONE ORDERED   Follow-Up: DR. Excell Seltzer 3 MONTHS ; I WILL HAVE DR. Earmon Phoenix NURSE KATY CALL YOU WITH AN APPT   Any Other Special Instructions Will Be Listed Below (If Applicable).     If you need a refill on your cardiac medications before your next appointment, please call your pharmacy.

## 2018-03-08 NOTE — Progress Notes (Signed)
Cardiology Office Note:    Date:  03/08/2018   ID:  Vedia Coffer, DOB 1931-05-06, MRN [id number]  PCP:  Derrill Center., MD  Cardiologist:  Sherren Mocha, MD  Electrophysiologist:  Dr. Thompson Grayer  CHF: Glori Bickers, MD  Referring MD: Derrill Center., MD   Chief Complaint  Patient presents with  . Follow-up    CHF    History of Present Illness:    Alex [name] is a 84 y.o. male with diastolic heart failure, atrial fibrillation, aortic valve disease status post bioprosthetic AVR in 2012, CAD status post CABG, carotid artery disease status post right CEA, chronic hypoxic respiratory failure, COPD, chronic kidney disease.  Cardiac catheterization 2014 demonstrated patent bypass grafts.  He suffered a subdural hematoma in 2/18 with prolonged hospitalization and rehab stay.   He was admitted twice in the fall 2018 for decompensated heart failure.  He was seen by the advanced heart failure team and was followed in the CHF clinic.  His last visit was in December 2018 and was to follow-up as needed.  He was last seen by Dr. Burt Knack 2/19.     Mr. Gamero returns for follow-up.  He is here with his wife.  Since last seen, he has been doing well.  He denies significant shortness of breath.  He does not use oxygen that much.  He denies orthopnea, PND or edema.  He denies chest discomfort or syncope.  Other than bruising, he denies any bleeding issues.  Prior CV studies:   The following studies were reviewed today:  Echo 07/28/17 Moderate LVH, EF 50-55, normal wall motion, calcified aortic valve, MAC, mildly reduced RVSF, mild RAE, PASP 51  TEE 11/14/14 EF 55-60, aortic valve prosthesis functioning normally, mild MR, no LAA clot  Echo 11/06/14 Mild LVH, EF 55, bioprosthetic aortic valve functioning normally (mean 11), MAC, trivial MR, mild LAE, mild RAE, PASP 36  Nuclear stress test 03/14/13 Inferoposterior scar with mild peri-infarct ischemia in the distal lateral wall, EF  48  Cardiac catheterization 03/22/13 LAD ostial 90 LCx occluded, small intermediate branch proximal 80 RCA proximal 50, mid 30-40 LIMA-LAD patent SVG-OM3/OM4 patent  Past Medical History:  Diagnosis Date  . Anxiety   . Aortic stenosis    s/p AVR by Dr Cyndia Bent  . COPD (chronic obstructive pulmonary disease) ([place])   . History of coronary artery disease    status post stenting of the marginal circumflex in 12/2003 and again in 2009  . Hyperlipidemia   . Insomnia   . Morbid obesity (HCC)    weight 243 pounds, BMI 31.2kg/m2, BSA 2.36 square meters  . Obstructive sleep apnea    compliant with CPAP  . Persistent atrial fibrillation ([place])   . Seizure ([place])   . Traumatic subdural hematoma without loss of consciousness (HCC)    Surgical Hx: The patient  has a past surgical history that includes Appendectomy; Replacement total knee bilateral; Carotid endarterectomy; permanent pacemaker; Aortic valve replacement (avr)/coronary artery bypass grafting (cabg) ( 08/05/2011); TEE without cardioversion (N/A, 11/14/2014); Cardioversion (N/A, 11/14/2014); Cardiac catheterization (N/A, 11/06/2015); Cardioversion (N/A, 11/20/2016); Craniotomy (Right, 12/16/2016); and Transurethral incision of prostate (N/A, 01/29/2017).   Current Medications: Current Meds  Medication Sig  . amiodarone (PACERONE) 200 MG tablet Take 1 tablet (200 mg total) by mouth 2 (two) times daily.  Marland Kitchen aspirin EC 81 MG tablet Take 81 mg daily by mouth.  Marland Kitchen atorvastatin (LIPITOR) 80 MG tablet TAKE 1 TABLET BY MOUTH DAILY (Patient taking differently: TAKE  1 TABLET (71m) BY MOUTH DAILY)  . divalproex (DEPAKOTE ER) 500 MG 24 hr tablet Take 1 tablet (500 mg total) by mouth at bedtime.  .Marland Kitchenescitalopram (LEXAPRO) 5 MG tablet Take 1 tablet (5 mg total) by mouth at bedtime.  . furosemide (LASIX) 20 MG tablet Take 2 tabs in the AM and 1 tab in the PM  . lactulose (CHRONULAC) 10 GM/15ML solution Take 30 mLs (20 g total) by mouth daily.  . magnesium  oxide (MAG-OX) 400 (241.3 Mg) MG tablet Take 0.5 tablets (200 mg total) by mouth daily.  . memantine (NAMENDA) 10 MG tablet Take 1 tablet (10 mg total) by mouth 2 (two) times daily.  . potassium chloride (K-DUR,KLOR-CON) 10 MEQ tablet Take 1 tablet (10 mEq total) by mouth 2 (two) times daily.  .Marland KitchenPROAIR HFA 108 (90 Base) MCG/ACT inhaler Inhale 2 puffs into the lungs every 6 (six) hours as needed for shortness of breath. wheezing  . torsemide (DEMADEX) 20 MG tablet Take 2 tablets (40 mg total) every morning, and 1 tablet (20 mg) every evening. Hold for weight < 222 and call HF clinic.  . traZODone (DESYREL) 50 MG tablet Take 50 mg by mouth at bedtime.     Allergies:   Patient has no known allergies.   Social History   Tobacco Use  . Smoking status: Former Smoker    Packs/day: 0.50    Years: 30.00    Pack years: 15.00    Types: Cigarettes    Start date: 10/27/1973    Last attempt to quit: 10/28/2003    Years since quitting: 14.3  . Smokeless tobacco: Never Used  Substance Use Topics  . Alcohol use: Yes    Alcohol/week: 0.0 oz    Comment: 3 glasses of wine per evening  . Drug use: No     Family Hx: The patient's family history includes Alzheimer's disease in his mother; Other in his father; Tuberculosis in his father.  ROS:   Please see the history of present illness.    ROS All other systems reviewed and are negative.   EKGs/Labs/Other Test Reviewed:    EKG:  EKG is  ordered today.  The ekg ordered today demonstrates ventricular paced, heart rate 74  Recent Labs: 07/27/2017: TSH 2.098 08/07/2017: ALT 29; B Natriuretic Peptide 351.7; Hemoglobin 10.3; Platelets 213 08/10/2017: Magnesium 1.8 09/30/2017: BUN 29; Creatinine, Ser 1.48; Potassium 3.8; Sodium 138   Labs from PCP 11/03/2017: TSH 1.078  02/25/2018: K4.4, creatinine 1.67, BUN 30, ALT 24, LDL 69   Recent Lipid Panel No results found for: CHOL, TRIG, HDL, CHOLHDL, LDLCALC, LDLDIRECT  Physical Exam:    VS:  BP 118/70  (BP Location: Right Arm, Patient Position: Sitting, Cuff Size: Normal)   Pulse 88   Ht _0  (1.88 m)   Wt 236 lb 6.4 oz (107.2 kg)   SpO2 97%   BMI 30.35 kg/m     Wt Readings from Last 3 Encounters:  03/08/18 236 lb 6.4 oz (107.2 kg)  12/11/17 242 lb (109.8 kg)  10/13/17 230 lb (104.3 kg)     Physical Exam  Constitutional: He is oriented to person, place, and time. He appears well-developed and well-nourished. No distress.  HENT:  Head: Normocephalic and atraumatic.  Neck: Neck supple. No JVD present.  Cardiovascular: Normal rate, regular rhythm, S1 normal and S2 normal.  No murmur heard. Pulmonary/Chest: He has decreased breath sounds. He has no rales.  Abdominal: Soft.  Musculoskeletal: He exhibits no edema.  Neurological: He is alert and oriented to person, place, and time.  Skin: Skin is warm and dry.    ASSESSMENT & PLAN:    Chronic diastolic CHF (congestive heart failure) ([place]) He is doing well on his current regimen.  His breathing is stable and his volume is stable.  His weight is down since last visit.  Recent labs with primary care demonstrates stable creatinine.  Permanent atrial fibrillation ([place]) He is not on any coagulation due to prior history of subdural hematoma.  He is rate controlled with amiodarone.  Recent LFTs and TSH within normal limits.  Coronary artery disease involving coronary bypass graft of native heart without angina pectoris History of CABG with patent grafts by cardiac catheterization in 2014.  He denies chest discomfort.  Continue aspirin, statin.  S/P AVR Well-functioning bioprosthetic aortic valve by echocardiogram in 2018.  Continue SBE prophylaxis.  CKD (chronic kidney disease) stage 3, GFR 30-59 ml/min (HCC) Recent creatinine stable.  Carotid artery disease It appears that he had an ultrasound in 2017 at [facility].  This may be managed by his primary care doctor.  I have asked him to discuss this with his primary care doctor at  the next visit.  If no one is following his carotid disease, we can certainly set him up for carotid ultrasound next visit.   Dispo:  Return in about 3 months (around 06/08/2018) for Routine Follow Up, w/ Dr. Burt Knack.   Medication Adjustments/Labs and Tests Ordered: Current medicines are reviewed at length with the patient today.  Concerns regarding medicines are outlined above.  Tests Ordered: Orders Placed This Encounter  Procedures  . EKG 12-Lead   Medication Changes: No orders of the defined types were placed in this encounter.   Signed, Richardson Dopp, PA-C  03/08/2018 10:39 AM    [facility] [place], [place], [place]  [address] Phone: [phone]; Fax: [phone]

## 2018-03-12 LAB — CUP PACEART REMOTE DEVICE CHECK
Battery Impedance: 181 Ohm
Battery Voltage: 2.78 V
Brady Statistic AS VS Percent: 19 %
Implantable Lead Implant Date: 20061113
Implantable Lead Location: 753860
Implantable Lead Model: 5076
Lead Channel Impedance Value: 388 Ohm
Lead Channel Setting Pacing Amplitude: 2 V
Lead Channel Setting Sensing Sensitivity: 2 mV
MDC IDC LEAD IMPLANT DT: 20061113
MDC IDC LEAD LOCATION: 753859
MDC IDC MSMT BATTERY REMAINING LONGEVITY: 90 mo
MDC IDC MSMT LEADCHNL RA PACING THRESHOLD AMPLITUDE: 0.625 V
MDC IDC MSMT LEADCHNL RA PACING THRESHOLD PULSEWIDTH: 0.4 ms
MDC IDC MSMT LEADCHNL RV IMPEDANCE VALUE: 1935 Ohm
MDC IDC PG IMPLANT DT: 20170110
MDC IDC SESS DTM: 20190425133133
MDC IDC SET LEADCHNL RV PACING AMPLITUDE: 4 V
MDC IDC SET LEADCHNL RV PACING PULSEWIDTH: 1.5 ms
MDC IDC STAT BRADY AP VP PERCENT: 11 %
MDC IDC STAT BRADY AP VS PERCENT: 0 %
MDC IDC STAT BRADY AS VP PERCENT: 70 %

## 2018-03-25 ENCOUNTER — Ambulatory Visit: Payer: Medicare Other | Admitting: Cardiovascular Disease

## 2018-03-31 MED FILL — AMIODARONE HCL 200 MG TABS: 200 | 90 days supply | Qty: 180 | Fill #0

## 2018-04-13 ENCOUNTER — Ambulatory Visit: Payer: Medicare Other | Admitting: Nurse Practitioner

## 2018-04-18 NOTE — Progress Notes (Signed)
[facility]  PATIENT: Alex Alex [name] DOB: 06-Nov-1930   REASON FOR VISIT: Follow-up for seizure disorder memory loss, history of subdural hematoma HISTORY FROM: Patient and wife    HISTORY OF PRESENT ILLNESS: Alex Alex [name] is 84 years old male, seen in refer by his primary care physician Dr. Alben Alex [name], Alex Alex [name]. for evaluation of seizure, he is accompanied by her wife Alex Alex [name] at today's clinical visit, initial evaluation was on July 14 2017.  Reviewed and summarized extensive hospital record, he had a past medical history of aortic stenosis status post aortic valve  paroxysmal atrial fibrillation, on chronic Coumadin treatment, status post pacemaker, COPD, coronary artery disease, status post CABG, hyperlipidemia,   He fell in early February, on December 01 2016, he fell at his house again, struck his head on concrete floor, 2 days later on December 03 2016, he complains of severe headaches, was brought into the emergency room, found to have right subdural hematoma, over the next couple weeks, gradually getting worse, eventually require right craniotomy evacuation, there was no clinical seizure, but EEG on December 05 2016 showed moderate diffuse slowing, epileptiform discharge on the right posterior temporal region, lasting 2-4 minutes, he has been treated with Keppra 500 mg twice a day,  He was later discharged for rehabilitation, we hospital admission again in April 2018 for sepsis, prostate abscess, require another round of inpatient rehabilitation, now his back home,  He was a Garment/textile technologist, was noted to have mild memory loss even prior to the incident, now gradually getting worse, he has not driven since February, continue has mild unsteady gait  UPDATE Oct 13 2017:YY EEG in September 2018 showed no evidence of epileptiform discharge. Lab in Nov 2018: BMP, creat 1.42,  Elevated BNP,  Normal TSH, B12 531, He is overall doing well, no falling, no seizure,  He is  the owner the dry wall company, wife noticed that he has gradual worsening memory loss, he is no longer driving His mother and sister have dementia.  Repeat CT head without contrast in Sep 2018: Showed mild hyper density and thickening of the dura of the right frontal and insular region, as well as over bilateral tentorium cerebelli, consistent with mild residual chronic subdural hemorrhage, no mass-effect, right parietal encephalomalacia, right craniotomy postsurgical changes, moderate atrophy, supratentorium small vessel disease, UPDATE 6/24/2019CM Mr. Alex [name], 84 year old male returns for follow-up with history of seizure disorder memory loss and subdural hematoma.  He reports one seizure since last seen he did not call into the office.  He is currently on Depakote 500mg  at hs. He has not had a level drawn.  He remains on Namenda 10 mg twice daily for memory loss without side effects.  Appetite is good.  He is sleeping well.  He ambulates with a rolling walker.  He returns for reevaluation  REVIEW OF SYSTEMS: Full 14 system review of systems performed and notable only for those listed, all others are neg:  Constitutional: neg  Cardiovascular: neg Ear/Nose/Throat: neg  Skin: neg Eyes: neg Respiratory: neg Gastroitestinal: neg  Hematology/Lymphatic: neg  Endocrine: neg Musculoskeletal: Walking difficulty Allergy/Immunology: neg Neurological:  memory loss Psychiatric: neg Sleep : neg   ALLERGIES: No Known Allergies  HOME MEDICATIONS: Outpatient Medications Prior to Visit  Medication Sig Dispense Refill  . amiodarone (PACERONE) 200 MG tablet Take 1 tablet (200 mg total) by mouth 2 (two) times daily. 180 tablet 3  . aspirin EC 81 MG tablet Take 81 mg daily by mouth.    Marland Kitchen  atorvastatin (LIPITOR) 80 MG tablet TAKE 1 TABLET BY MOUTH DAILY (Patient taking differently: TAKE 1 TABLET (80mg ) BY MOUTH DAILY) 90 tablet 1  . divalproex (DEPAKOTE ER) 500 MG 24 hr tablet Take 1 tablet (500 mg  total) by mouth at bedtime. 90 tablet 3  . escitalopram (LEXAPRO) 5 MG tablet Take 1 tablet (5 mg total) by mouth at bedtime.    . furosemide (LASIX) 20 MG tablet Take 2 tabs in the AM and 1 tab in the PM    . lactulose (CHRONULAC) 10 GM/15ML solution Take 30 mLs (20 g total) by mouth daily. 240 mL 0  . magnesium oxide (MAG-OX) 400 (241.3 Mg) MG tablet Take 0.5 tablets (200 mg total) by mouth daily.    . memantine (NAMENDA) 10 MG tablet Take 1 tablet (10 mg total) by mouth 2 (two) times daily. 180 tablet 3  . potassium chloride (K-DUR,KLOR-CON) 10 MEQ tablet Take 1 tablet (10 mEq total) by mouth 2 (two) times daily. 60 tablet 3  . PROAIR HFA 108 (90 Base) MCG/ACT inhaler Inhale 2 puffs into the lungs every 6 (six) hours as needed for shortness of breath. wheezing  11  . torsemide (DEMADEX) 20 MG tablet Take 2 tablets (40 mg total) every morning, and 1 tablet (20 mg) every evening. Hold for weight < 222 and call HF clinic. 100 tablet 6  . traZODone (DESYREL) 50 MG tablet Take 50 mg by mouth at bedtime.  5   No facility-administered medications prior to visit.     PAST MEDICAL HISTORY: Past Medical History:  Diagnosis Date  . Anxiety   . Aortic stenosis    s/p AVR by Dr Laneta Simmers  . COPD (chronic obstructive pulmonary disease) (HCC)   . History of coronary artery disease    status post stenting of the marginal circumflex in 12/2003 and again in 2009  . Hyperlipidemia   . Insomnia   . Morbid obesity (HCC)    weight 243 pounds, BMI 31.2kg/m2, BSA 2.36 square meters  . Obstructive sleep apnea    compliant with CPAP  . Persistent atrial fibrillation (HCC)   . Seizure (HCC)   . Traumatic subdural hematoma without loss of consciousness (HCC)     PAST SURGICAL HISTORY: Past Surgical History:  Procedure Laterality Date  . AORTIC VALVE REPLACEMENT (AVR)/CORONARY ARTERY BYPASS GRAFTING (CABG)   08/05/2011   LIMA to LAD, sequential saphenous vein graft to third and fourth obtuse marginal  branches of the circumflex, aortic valve replacement using a 23 mm Edwards pericardial valve  . APPENDECTOMY    . CARDIOVERSION N/A 11/14/2014   Procedure: CARDIOVERSION;  Surgeon: Donato Schultz, MD;  Location: [facility] ENDOSCOPY;  Service: Cardiovascular;  Laterality: N/A;  . CARDIOVERSION N/A 11/20/2016   Procedure: CARDIOVERSION;  Surgeon: Pricilla Riffle, MD;  Location: [facility] ENDOSCOPY;  Service: Cardiovascular;  Laterality: N/A;  . CAROTID ENDARTERECTOMY     Dr Lollie Sails  . CRANIOTOMY Right 12/16/2016   Procedure: CRANIOTOMY HEMATOMA EVACUATION SUBDURAL;  Surgeon: Loura Halt Ditty, MD;  Location: [facility] OR;  Service: Neurosurgery;  Laterality: Right;  . EP IMPLANTABLE DEVICE N/A 11/06/2015   Procedure: PPM Generator Changeout;for sick sinus syndrome with a MDT Adapta L PPM, chronically elevated RV threshold.  . permanent pacemaker     MDT EnRhythm implanted by Dr Lawanda Cousins in [place] for complete heart block with syncope  . REPLACEMENT TOTAL KNEE BILATERAL     2006  . TEE WITHOUT CARDIOVERSION N/A 11/14/2014   Procedure: TRANSESOPHAGEAL ECHOCARDIOGRAM (  TEE);  Surgeon: Donato Schultz, MD;  Location: [facility] ENDOSCOPY;  Service: Cardiovascular;  Laterality: N/A;  . TRANSURETHRAL INCISION OF PROSTATE N/A 01/29/2017   Procedure: TRANSURETHRAL UNROOFING OF A  PROSTATE ABSCESS;  Surgeon: Ihor Gully, MD;  Location: [facility] ORS;  Service: Urology;  Laterality: N/A;    FAMILY HISTORY: Family History  Problem Relation Age of Onset  . Alzheimer's disease Mother   . Tuberculosis Father   . Other Father        Spinal Meningitis    SOCIAL HISTORY: Social History   Socioeconomic History  . Marital status: Married    Spouse name: Not on file  . Number of children: 2  . Years of education: 8th grade  . Highest education level: Not on file  Occupational History  . Occupation: Retired  Engineer, production  . Financial resource strain: Not on file  . Food insecurity:    Worry: Not on file    Inability: Not on file  .  Transportation needs:    Medical: Not on file    Non-medical: Not on file  Tobacco Use  . Smoking status: Former Smoker    Packs/day: 0.50    Years: 30.00    Pack years: 15.00    Types: Cigarettes    Start date: 10/27/1973    Last attempt to quit: 10/28/2003    Years since quitting: 14.4  . Smokeless tobacco: Never Used  Substance and Sexual Activity  . Alcohol use: Yes    Alcohol/week: 0.0 oz    Comment: 3 glasses of wine per evening  . Drug use: No  . Sexual activity: Not on file  Lifestyle  . Physical activity:    Days per week: Not on file    Minutes per session: Not on file  . Stress: Not on file  Relationships  . Social connections:    Talks on phone: Not on file    Gets together: Not on file    Attends religious service: Not on file    Active member of club or organization: Not on file    Attends meetings of clubs or organizations: Not on file    Relationship status: Not on file  . Intimate partner violence:    Fear of current or ex partner: Not on file    Emotionally abused: Not on file    Physically abused: Not on file    Forced sexual activity: Not on file  Other Topics Concern  . Not on file  Social History Narrative   Lives in [place] with spouse.  Owns a Designer, industrial/product.   Right-handed.   1 cup coffee per day.     PHYSICAL EXAM  Vitals:   04/19/18 1324  BP: 120/73  Pulse: 92  Weight: 242 lb 3.2 oz (109.9 kg)  Height: 6\' 2"  (1.88 m)   Body mass index is 31.1 kg/m.  Generalized: Well developed, in no acute distress  Head: normocephalic and atraumatic,. Oropharynx benign  Neck: Supple,   Musculoskeletal: No deformity   Neurological examination   Mentation: Alert AFT 8. Clock drawing 3/4 MMSE - Mini Mental State Exam 04/19/2018 10/13/2017 07/14/2017  Orientation to time 3 3 2   Orientation to Place 2 3 5   Registration 3 3 3   Attention/ Calculation 3 3 4   Recall 1 1 1   Language- name 2 objects 2 2 2   Language- repeat 1 1 1     Language- follow 3 step command 3 3 3   Language- read & follow  direction 1 1 1   Write a sentence 1 1 1   Copy design 1 0 1  Total score 21 21 24    Follows all commands speech and language fluent.   Cranial nerve II-XII: Pupils were equal round reactive to light extraocular movements were full, visual field were full on confrontational test. Facial sensation and strength were normal. hearing was intact to finger rubbing bilaterally. Uvula tongue midline. head turning and shoulder shrug were normal and symmetric.Tongue protrusion into cheek strength was normal. Motor: normal bulk and tone, full strength in the BUE, BLE,  Sensory: normal and symmetric to light touch,  Coordination: finger-nose-finger, heel-to-shin bilaterally, no dysmetria Reflexes: Brachioradialis 2/2, biceps 2/2, triceps 2/2, patellar 2/2, Achilles 2/2, plantar responses were flexor bilaterally. Gait and Station: Rising up from seated position with push off, cautious mildly unsteady gait no difficulty with turns ambulates with walker. DIAGNOSTIC DATA (LABS, IMAGING, TESTING) - I reviewed patient records, labs, notes, testing and imaging myself where available.  Lab Results  Component Value Date   WBC 6.0 08/07/2017   HGB 10.3 (L) 08/07/2017   HCT 35.0 (L) 08/07/2017   MCV 90.2 08/07/2017   PLT 213 08/07/2017      Component Value Date/Time   NA 138 09/30/2017 1219   NA 142 11/14/2016 1601   K 3.8 09/30/2017 1219   CL 98 (L) 09/30/2017 1219   CO2 29 09/30/2017 1219   GLUCOSE 81 09/30/2017 1219   BUN 29 (H) 09/30/2017 1219   BUN 23 11/14/2016 1601   CREATININE 1.48 (H) 09/30/2017 1219   CREATININE 0.92 11/01/2015 1104   CALCIUM 9.0 09/30/2017 1219   PROT 6.5 08/07/2017 1200   ALBUMIN 3.4 (L) 08/07/2017 1200   AST 39 08/07/2017 1200   ALT 29 08/07/2017 1200   ALKPHOS 79 08/07/2017 1200   BILITOT 1.3 (H) 08/07/2017 1200   GFRNONAA 42 (L) 09/30/2017 1219   GFRAA 48 (L) 09/30/2017 1219    Lab Results   Component Value Date   HGBA1C 6.1 (H) 07/14/2017   Lab Results  Component Value Date   VITAMINB12 531 07/14/2017   Lab Results  Component Value Date   TSH 2.098 07/27/2017      ASSESSMENT AND PLAN  Jetty DuhamelJohn R Alex [name] is a 84 y.o. male  with  hematoma February 2018, abnormal EEG evidence of epileptiform discharges and mild cognitive impairment/memory loss.  Family history of dementia. Repeat CT head without contrast in Sep 2018: Showed mild hyper density and thickening of the dura of the right frontal and insular region, as well as over bilateral tentorium cerebelli, consistent with mild residual chronic subdural hemorrhage, no mass-effect, right parietal encephalomalacia, right craniotomy postsurgical changes, moderate atrophy, supratentorium small vessel disease, Laboratory evaluation showed no treatable cause of memory loss               PLAN: Continue Namenda 10mg  twice daily does not need refills Continue Depakote 500mg  at hs  Will check depakote level today Use walker at all times for safe ambulation. Pt no longer driving F/U 6 months  Nilda RiggsNancy Carolyn Martin, [facility] Asc LLCGNP, Wh[facility] Arh HospitalBC, APRN  [facility] [address], [address] [place], [place] [address] [phone]

## 2018-04-19 ENCOUNTER — Ambulatory Visit: Payer: Medicare Other | Admitting: Nurse Practitioner

## 2018-04-19 ENCOUNTER — Encounter: Payer: Self-pay | Admitting: Nurse Practitioner

## 2018-04-19 VITALS — BP 120/73 | HR 92 | Ht 74.0 in | Wt 242.2 lb

## 2018-04-19 DIAGNOSIS — Z8679 Personal history of other diseases of the circulatory system: Secondary | ICD-10-CM

## 2018-04-19 DIAGNOSIS — R413 Other amnesia: Secondary | ICD-10-CM | POA: Diagnosis not present

## 2018-04-19 DIAGNOSIS — R569 Unspecified convulsions: Secondary | ICD-10-CM | POA: Diagnosis not present

## 2018-04-19 NOTE — Patient Instructions (Signed)
Continue Namenda 10mg  twice daily Continue Depakote 500mg  at hs  Will check depakote level today Use walker at all times for safe ambulation. F/U 6 months

## 2018-04-20 ENCOUNTER — Telehealth: Payer: Self-pay | Admitting: Nurse Practitioner

## 2018-04-20 LAB — VALPROIC ACID LEVEL: VALPROIC ACID LVL: 35 ug/mL — AB (ref 50–100)

## 2018-04-20 MED ORDER — DIVALPROEX SODIUM ER 500 MG PO TB24: 1000.0000 mg | ORAL_TABLET | Freq: Every day | ORAL | 11 refills | Status: AC

## 2018-04-20 MED FILL — DIVALPROEX SOD ER 500 MG TA: 500 | 30 days supply | Qty: 60 | Fill #0

## 2018-04-20 NOTE — Telephone Encounter (Signed)
I spoke to wife and relayed the lab results of his valproic acid level and it being sub therapeutic.  Pt to increase his dose of depakote 500mg  to 2 tablets at bedtime.  She verbalized understanding.

## 2018-04-20 NOTE — Telephone Encounter (Signed)
Valproic acid level subtherapeutic at 35.  Increase Depakote to 2 tablets at hs. Will renew RX please call the patient

## 2018-04-22 MED FILL — ESCITALOPRAM 10 MG TABLET: 10 | 90 days supply | Qty: 90 | Fill #0

## 2018-04-22 MED FILL — traZODone HCL 50 MG TABS: 50 | 90 days supply | Qty: 90 | Fill #1

## 2018-04-26 NOTE — Progress Notes (Signed)
I have reviewed and agreed above plan. 

## 2018-05-17 MED FILL — MEMANTINE HCL 10 MG TABLET: 10 | 90 days supply | Qty: 180 | Fill #1

## 2018-05-17 MED FILL — POTASSIUM CL 10 MEQ TAB SA: 10 | 90 days supply | Qty: 180 | Fill #1

## 2018-05-17 MED FILL — DIVALPROEX SOD ER 500 MG TA: 500 | 30 days supply | Qty: 60 | Fill #1

## 2018-05-17 MED FILL — ATORVASTATIN 80 MG TABLET: 80 | 90 days supply | Qty: 90 | Fill #1

## 2018-05-17 MED FILL — FUROSEMIDE 20 MG TAB: 20 | 90 days supply | Qty: 360 | Fill #1

## 2018-05-20 ENCOUNTER — Telehealth: Payer: Self-pay | Admitting: Cardiology

## 2018-05-20 ENCOUNTER — Ambulatory Visit (INDEPENDENT_AMBULATORY_CARE_PROVIDER_SITE_OTHER): Payer: Medicare Other | Admitting: *Deleted

## 2018-05-20 DIAGNOSIS — I442 Atrioventricular block, complete: Secondary | ICD-10-CM | POA: Diagnosis not present

## 2018-05-20 NOTE — Telephone Encounter (Signed)
Confirmed remote transmission w/ pt wife.   

## 2018-05-21 NOTE — Progress Notes (Signed)
Remote pacemaker transmission.   

## 2018-05-22 IMAGING — DX DG CHEST 2V
2 series · 2 of 2 positions shown · non-contrast
Comparison: Report of a chest x-ray dated August 25, 2011

CLINICAL DATA: Increasing exertional shortness of breath. History
of COPD, atrial fibrillation, coronary artery disease and aortic
stenosis.

EXAM:
CHEST  2 VIEW

[chest pa]
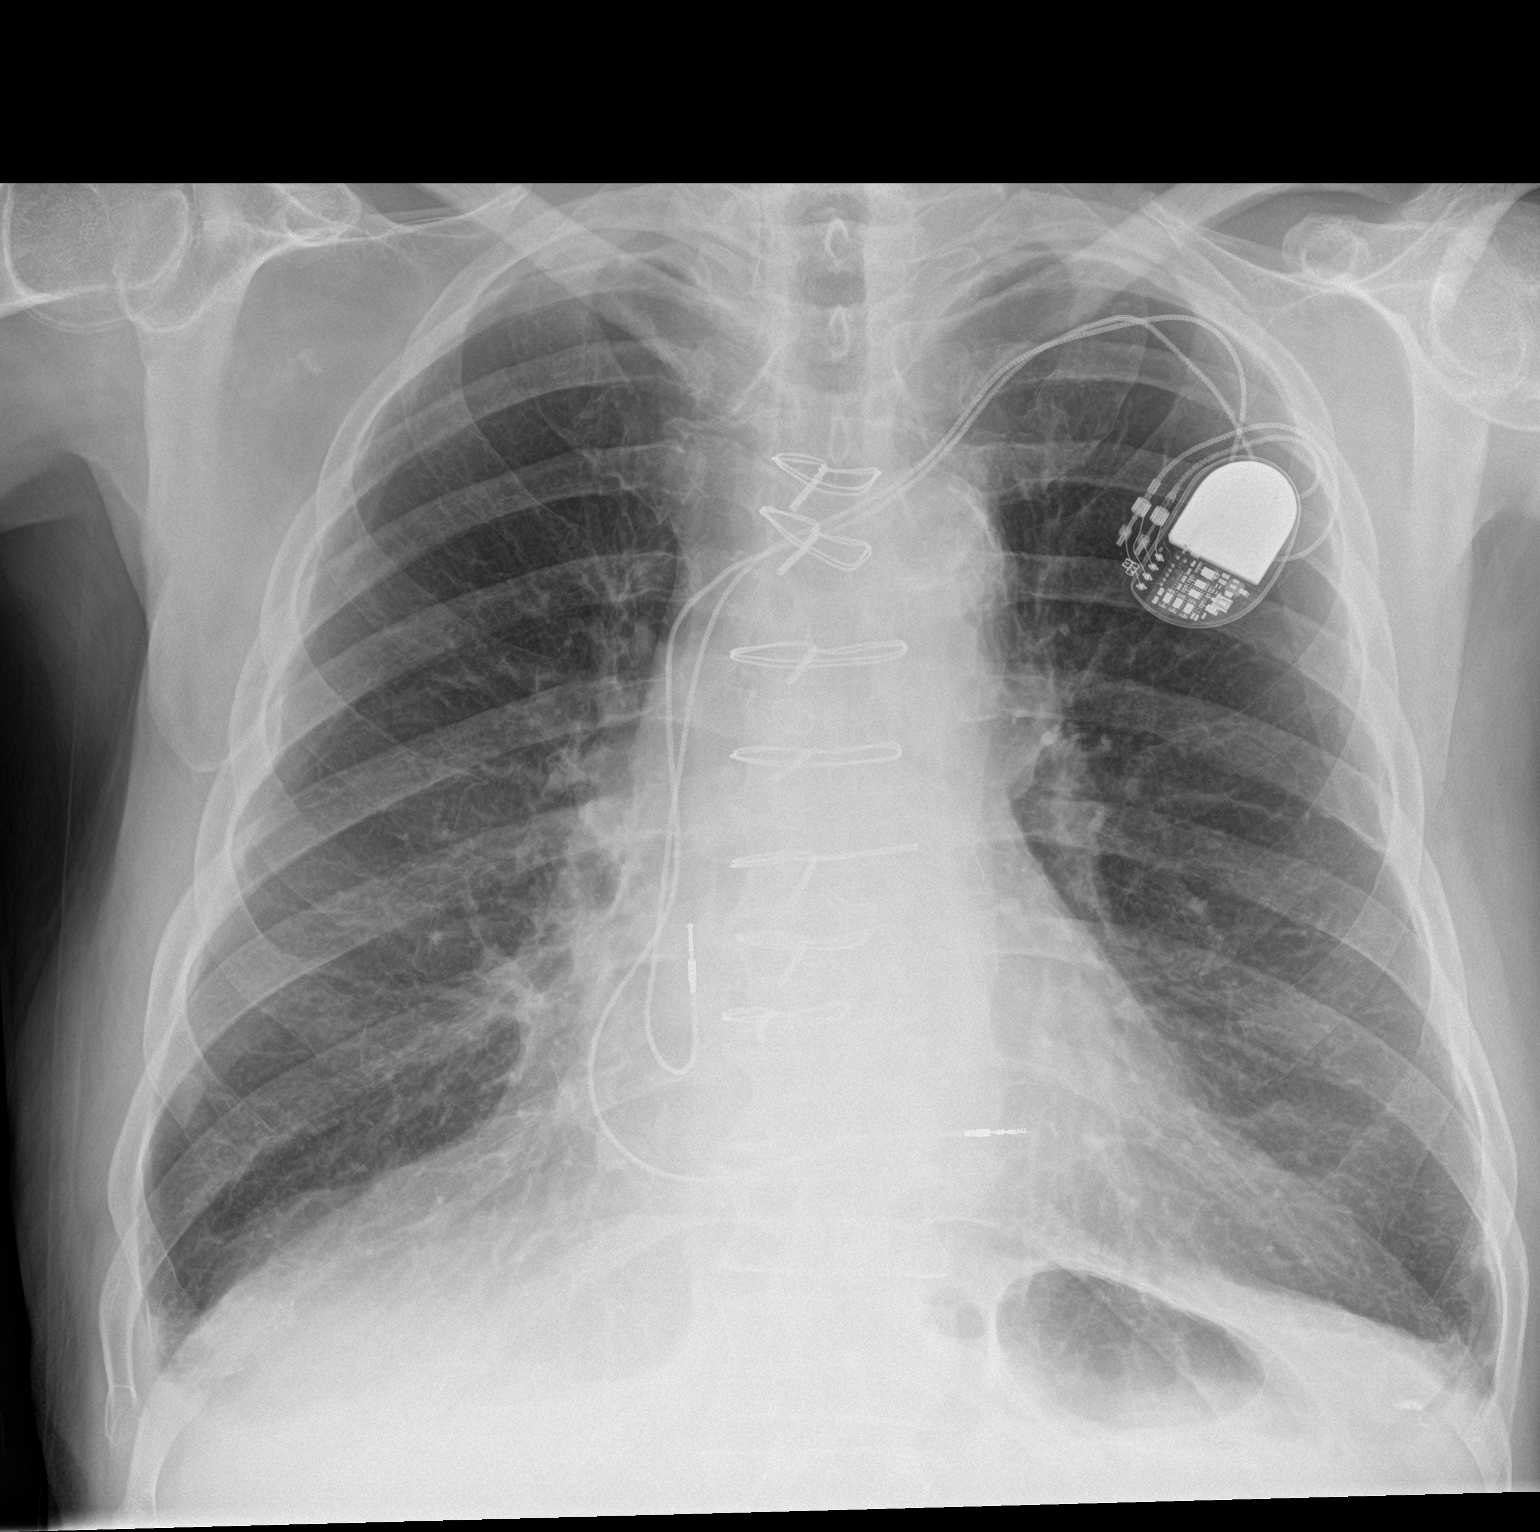

[chest lat]
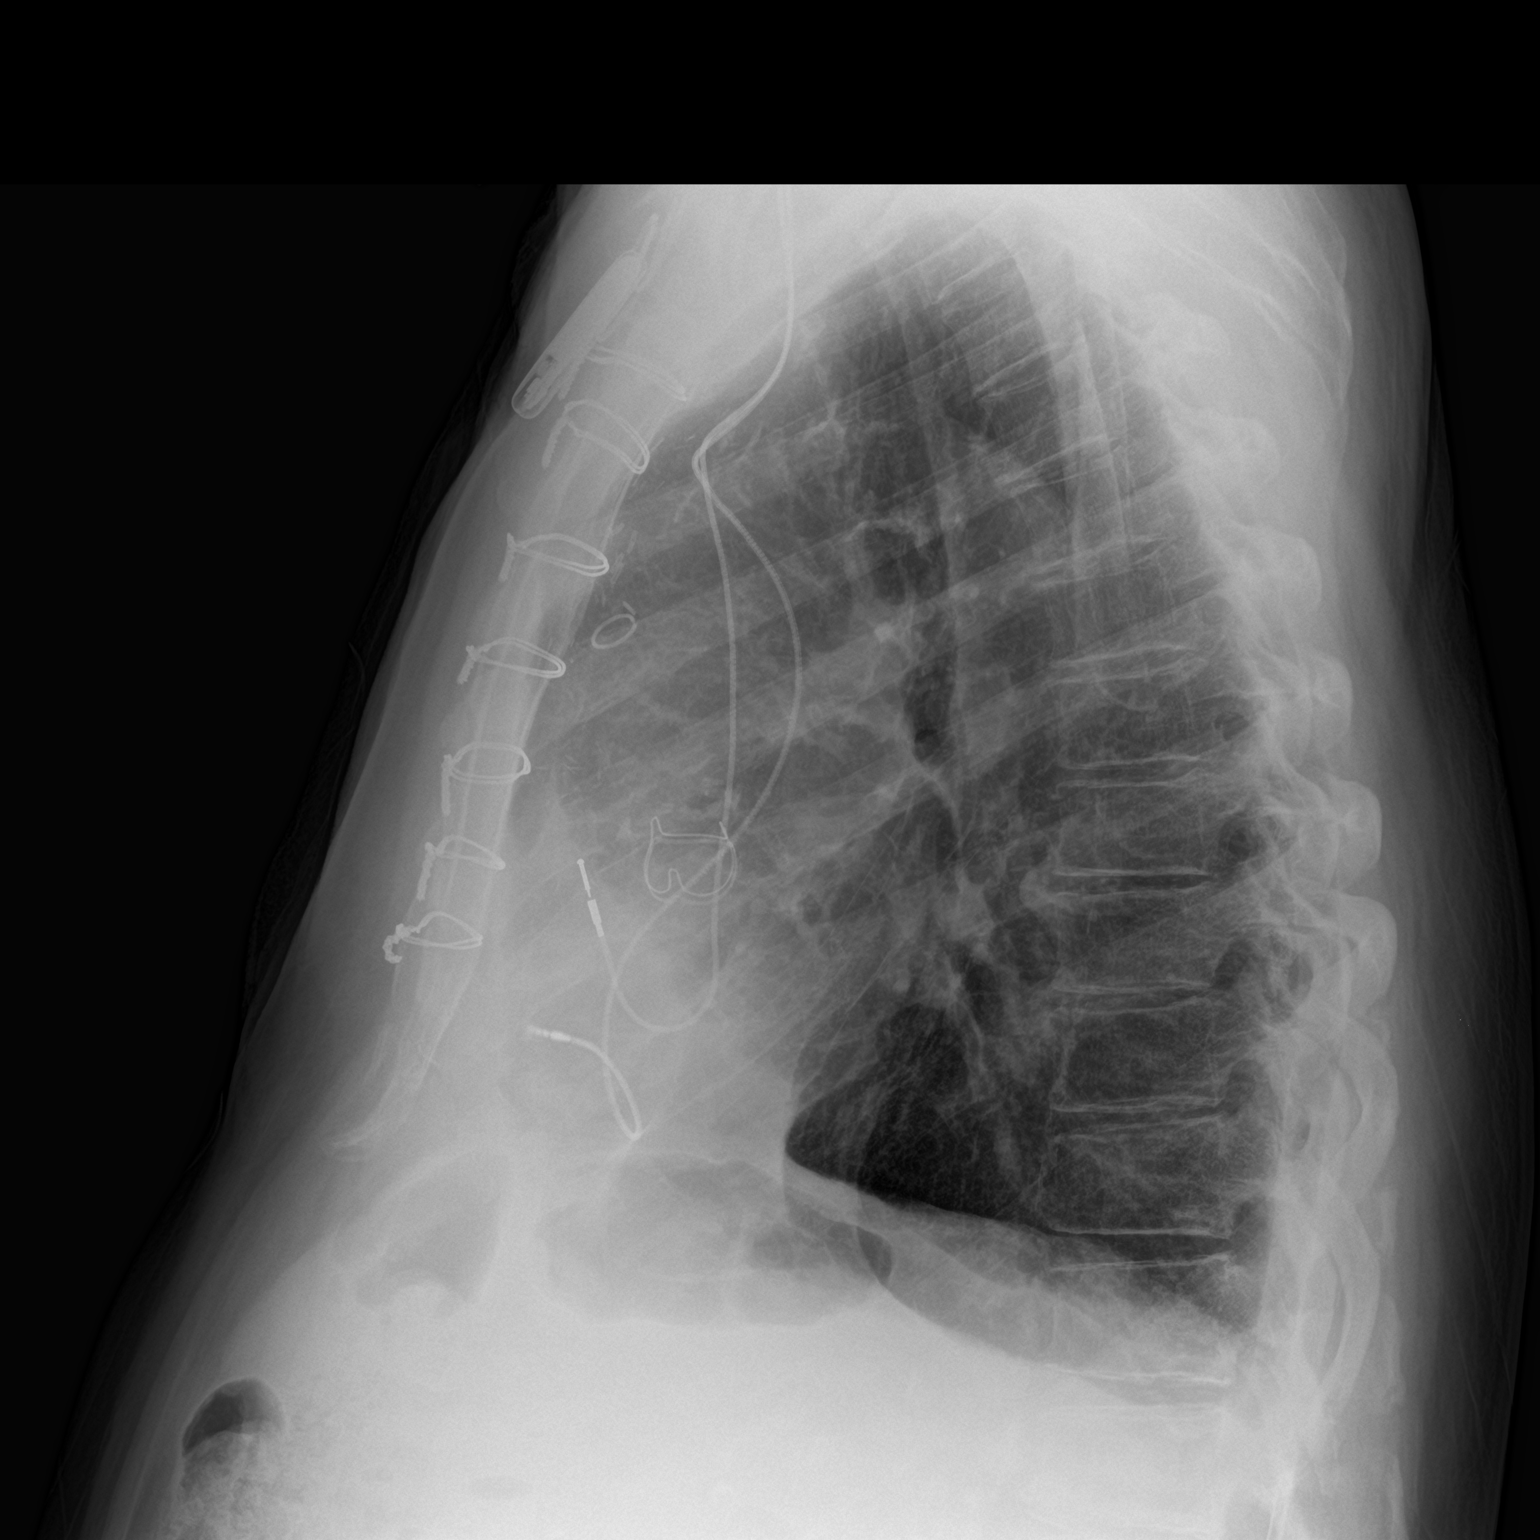

[2 of 2 positions shown; findings below may reference images not displayed]

FINDINGS: The lungs are mildly hyperinflated with hemidiaphragm flattening.
There is no focal infiltrate. There is a small amount of pleural
thickening or pleural fluid at the right lung base. The interstitial
markings are not abnormally increased. The heart and pulmonary
vascularity are normal. The ICD is in stable position. The sternal
wires are intact. The prosthetic aortic valve ring appears to be in
appropriate position. There is calcification in the wall of the
aortic arch. The mediastinum is normal in width. The bony thorax
exhibits no acute abnormality.
IMPRESSION: Chronic bronchitic changes, likely stable. Tiny right pleural
effusion but this is not new by report. Previous CABG. No acute
pneumonia.

Thoracic aortic atherosclerosis.

## 2018-05-24 IMAGING — CT CT HEAD W/O CM
3 of 4 series · 14 of 47 positions shown, 16 images · non-contrast
Comparison: CT HEAD December 03, 2016

CLINICAL DATA: Frontal headache, LEFT drift for 1 hour. Follow-up
subdural hematoma.

EXAM:
CT HEAD WITHOUT CONTRAST
TECHNIQUE: Contiguous axial images were obtained from the base of the skull
through the vertex without intravenous contrast.

[Series 2: head 5.0 h30s · axial · 0.50mm/px · z∈[-63,+62]mm · 8 of 33 slices shown, 10 images]
[im 4/33  brain]
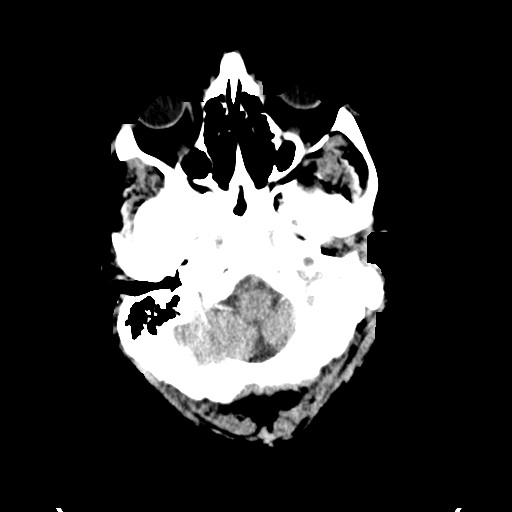
[im 4/33  bone]
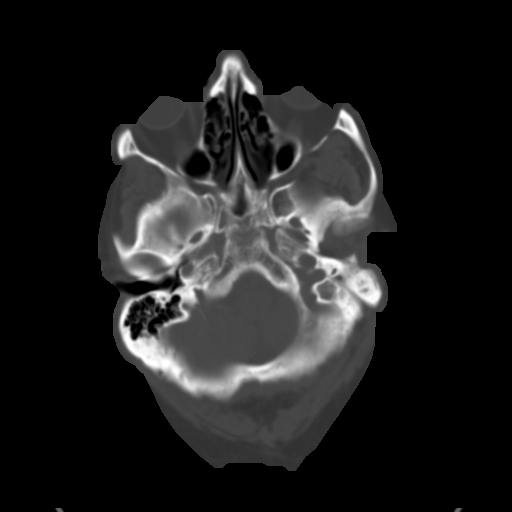
[im 7/33  brain]
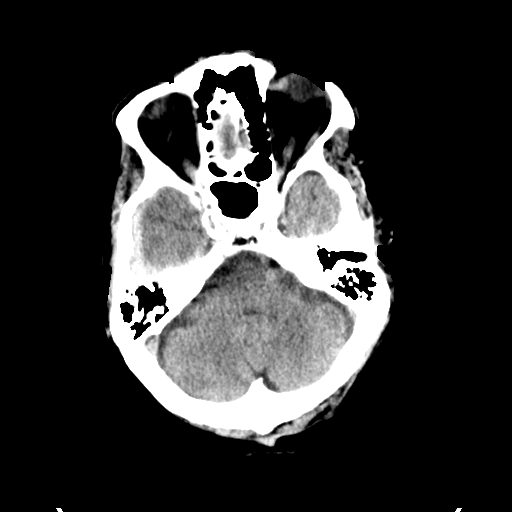
[im 10/33  brain]
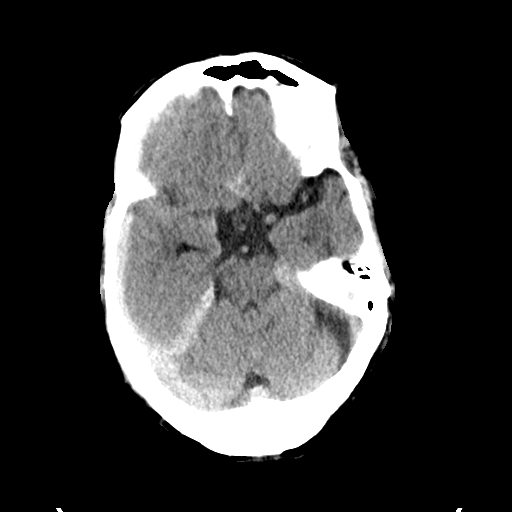
[im 15/33  brain]
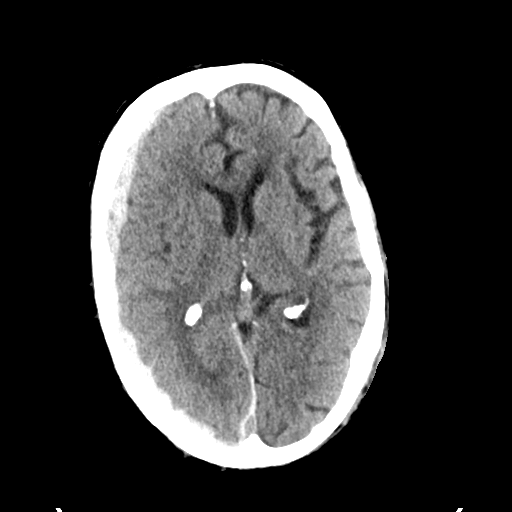
[im 18/33  brain]
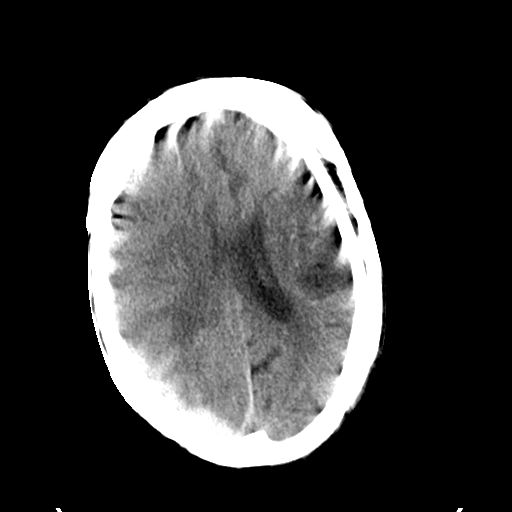
[im 18/33  bone]
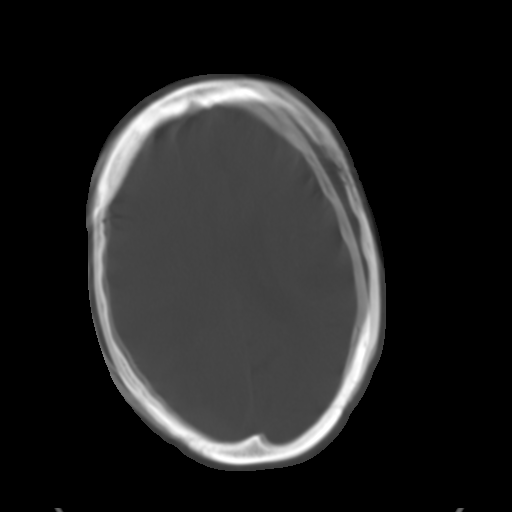
[im 23/33  brain]
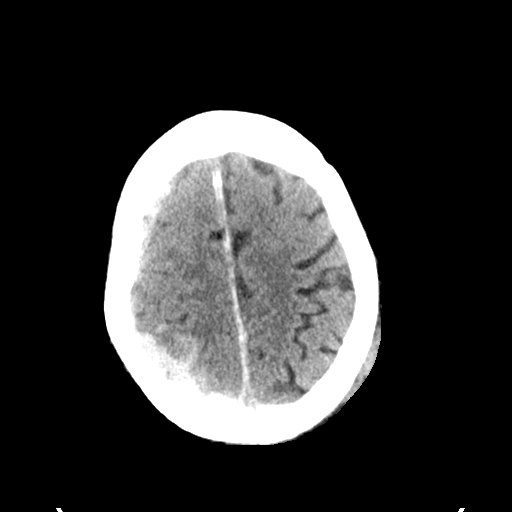
[im 26/33  brain]
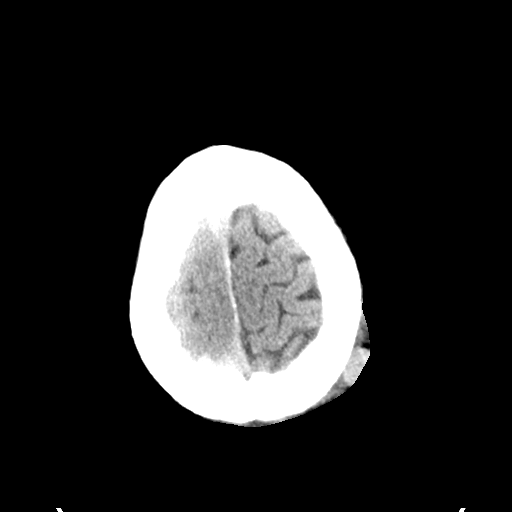
[im 29/33  brain]
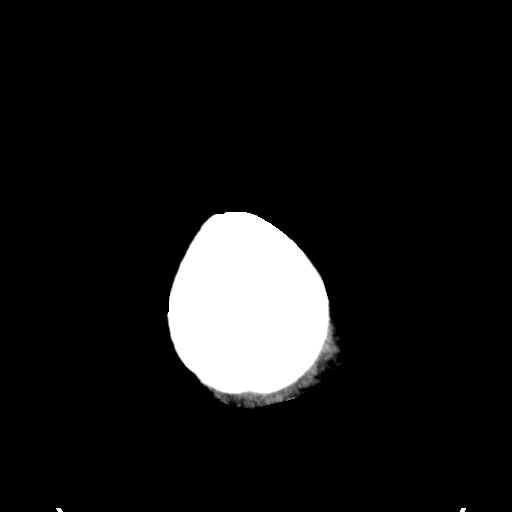

[Series 7: head 5.0 mpr cor · coronal · 0.31mm/px · 3 of 43 slices shown]
[im 19/43  brain]
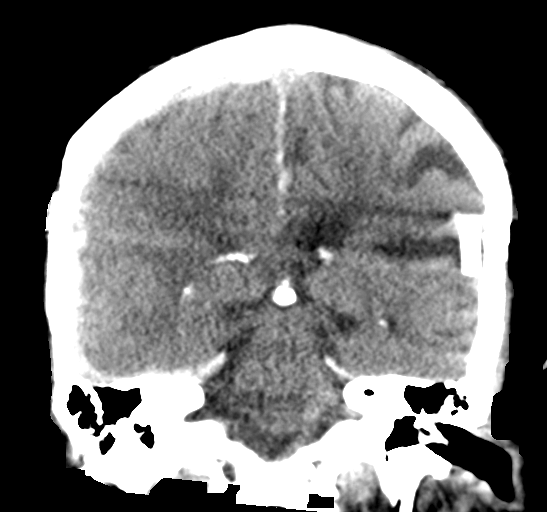
[im 24/43  brain]
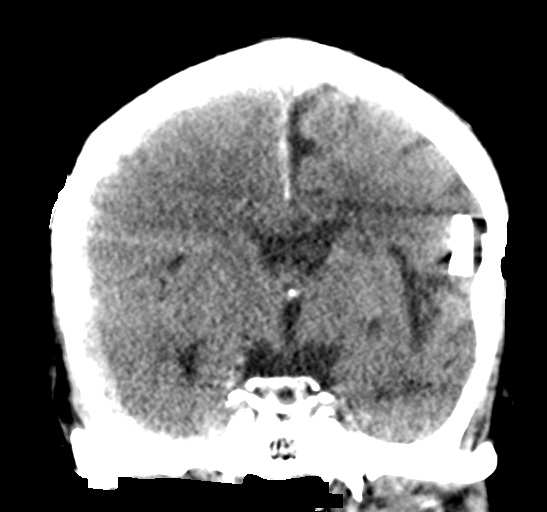
[im 29/43  brain]
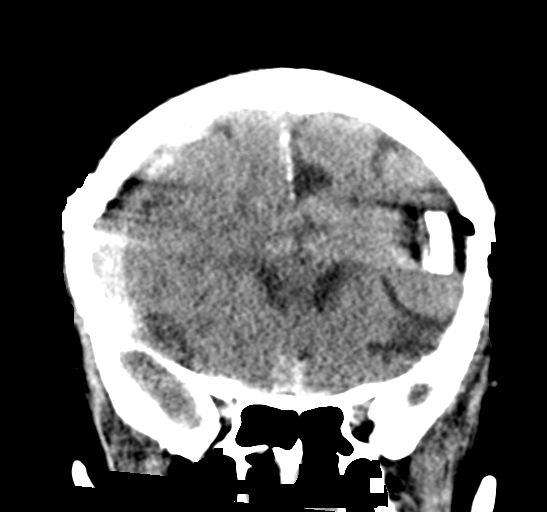

[Series 8: head 5.0 mpr sag · sagittal · 0.31mm/px · 3 of 33 slices shown]
[im 11/33  brain]
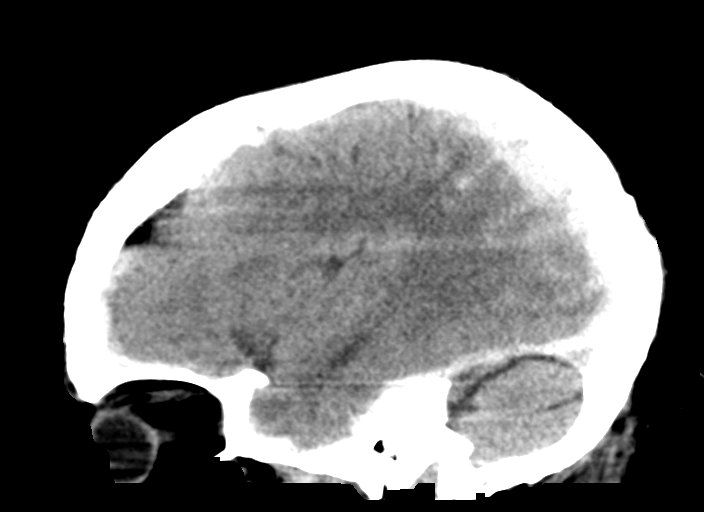
[im 17/33  brain]
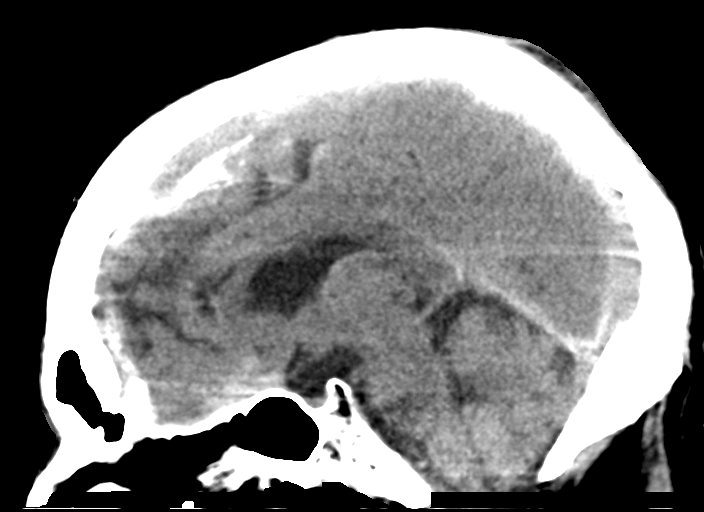
[im 22/33  brain]
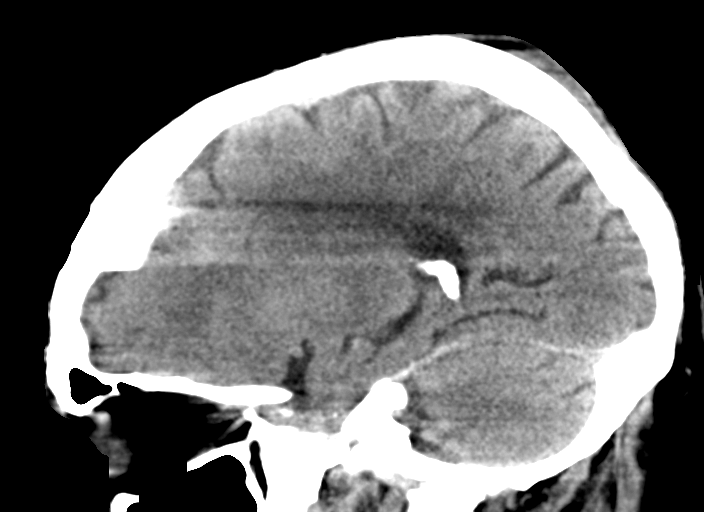

[14 of 47 positions shown; findings below may reference images not displayed]

FINDINGS: BRAIN: Similar 11 mm dense RIGHT holo hemispheric subdural hematoma.
2 mm RIGHT falcotentorial subdural hematoma, trace LEFT cerebellar
tentorium subdural hematoma. Small amount of RIGHT parietal
subarachnoid hemorrhage. Focal subarachnoid hemorrhage versus
subdural hematoma anterior falx/ anterior cranial fossa. 6 mm RIGHT
to LEFT midline shift. Partially effaced RIGHT lateral ventricle
without hydrocephalus or entrapment. Patchy supratentorial white
matter hypodensities compatible with mild chronic small vessel
ischemic disease. No acute large vascular territory infarct. Basal
cisterns remain patent.

VASCULAR: Moderate calcific atherosclerosis of the carotid siphons.

SKULL: No skull fracture. Large LEFT parietal scalp hematoma without
subcutaneous gas or radiopaque foreign bodies.

SINUSES/ORBITS: The mastoid air-cells and included paranasal sinuses
are well-aerated. Status post bilateral ocular lens implants. The
included ocular globes and orbital contents are non-suspicious.

OTHER: None.
IMPRESSION: Stable examination: 11 mm RIGHT holo hemispheric acute subdural
hematoma results in 6 mm RIGHT to LEFT midline shift. No ventricular
entrapment.

2 mm RIGHT falcotentorial and trace LEFT cerebellar tentorium
subdural hematomas.

Small amount of RIGHT parietal subarachnoid hemorrhage. Subarachnoid
hemorrhage versus small subdural hematoma falx/ anterior cranial
fossa.

## 2018-05-24 IMAGING — CT CT HEAD W/O CM
3 series · 15 of 47 positions shown, 18 images · non-contrast
Comparison: 05/05/2014

CLINICAL DATA: Multiple falls, most recently yesterday.

EXAM:
CT HEAD WITHOUT CONTRAST
TECHNIQUE: Contiguous axial images were obtained from the base of the skull
through the vertex without intravenous contrast.

[Series 2: head wo · axial · 0.46mm/px · z∈[-196,-51]mm · 9 of 35 slices shown, 12 images]
[im 3/35  brain]
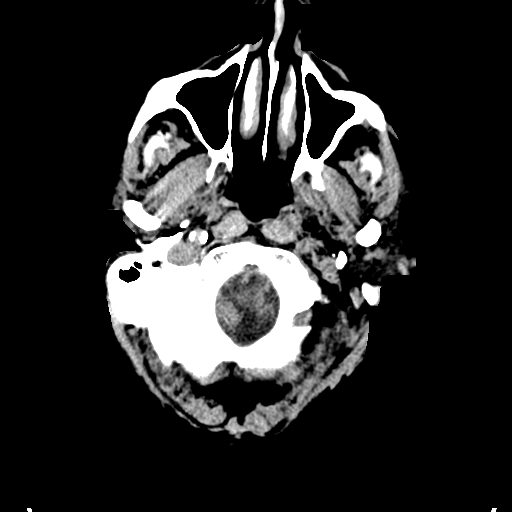
[im 3/35  bone]
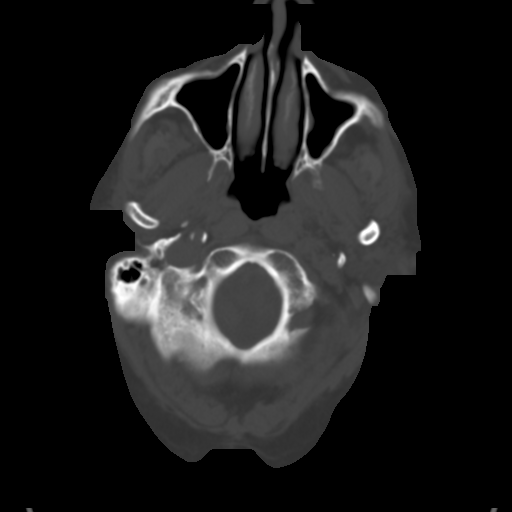
[im 6/35  brain]
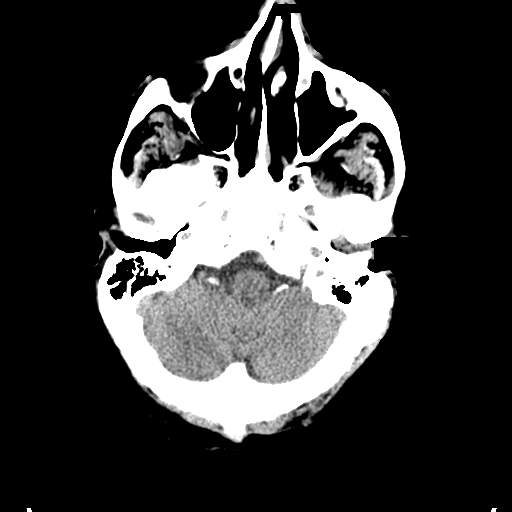
[im 10/35  brain]
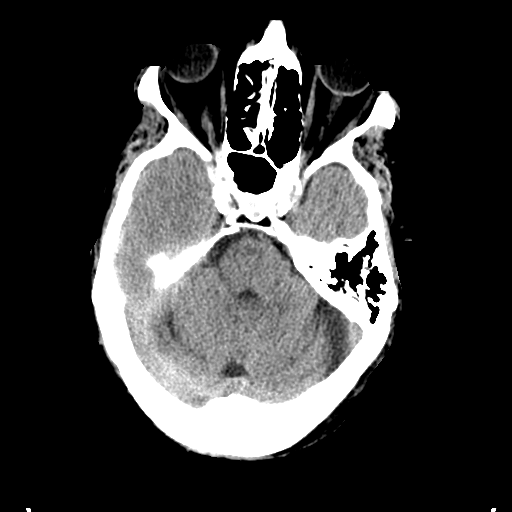
[im 13/35  brain]
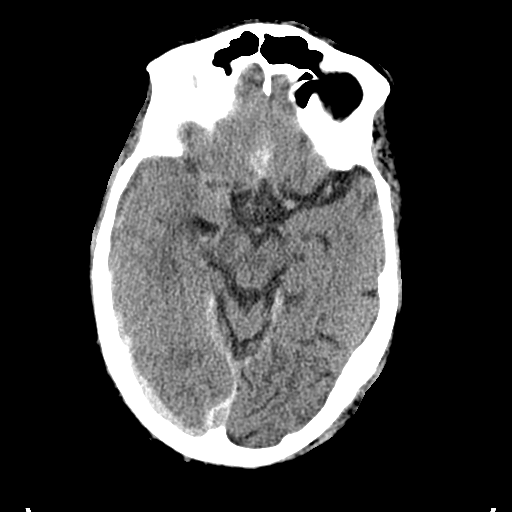
[im 18/35  brain]
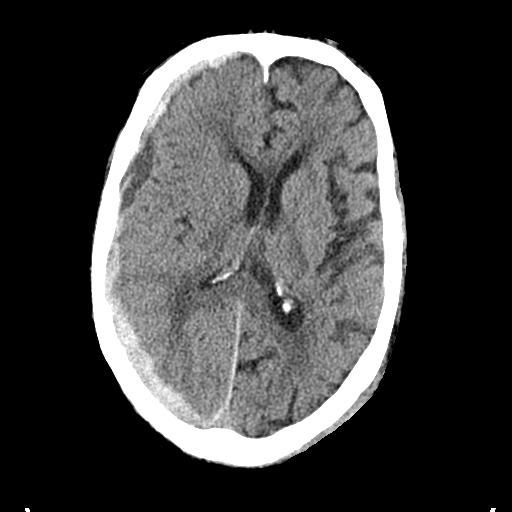
[im 18/35  bone]
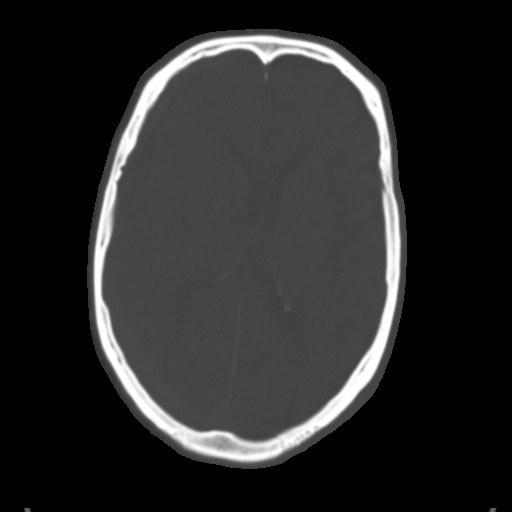
[im 22/35  brain]
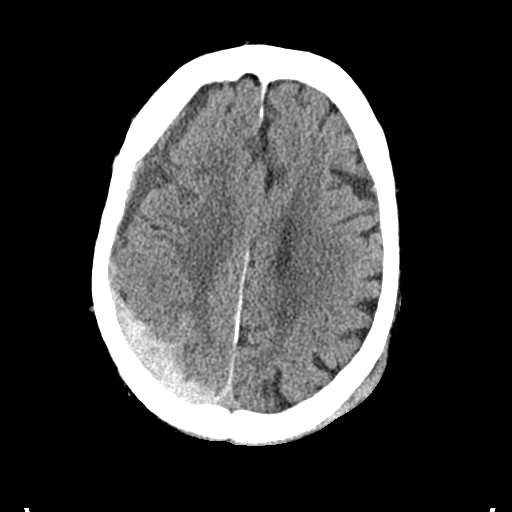
[im 25/35  brain]
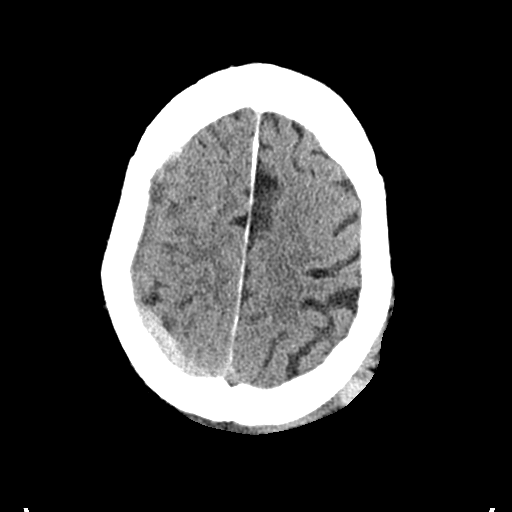
[im 29/35  brain]
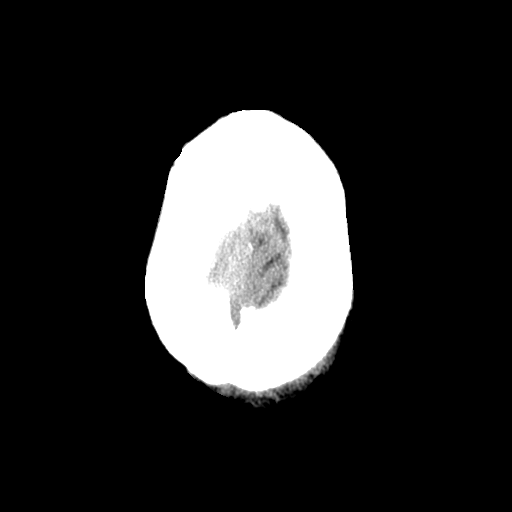
[im 32/35  brain]
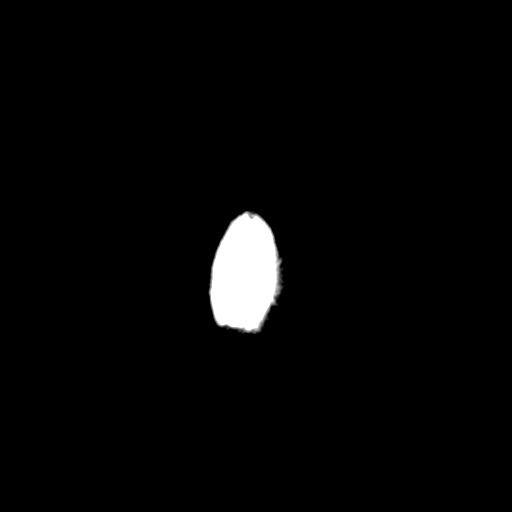
[im 32/35  bone]
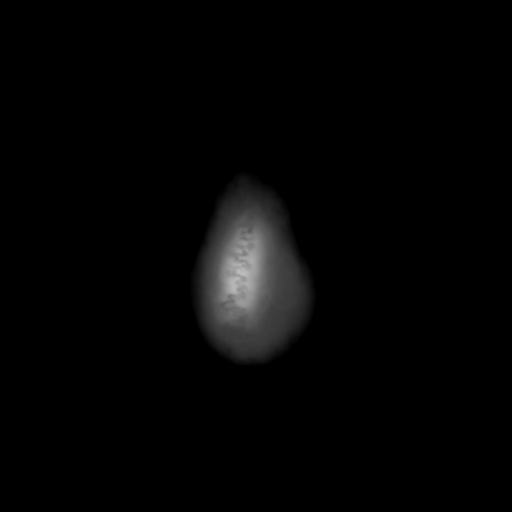

[Series 4: cor soft · coronal · 0.35mm/px · 3 of 75 slices shown]
[im 25/75  brain]
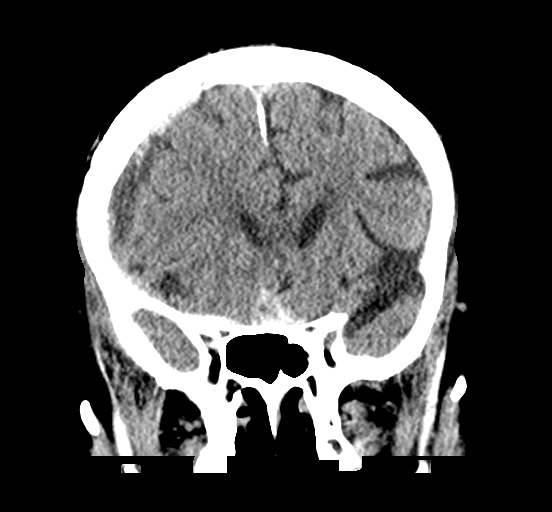
[im 33/75  brain]
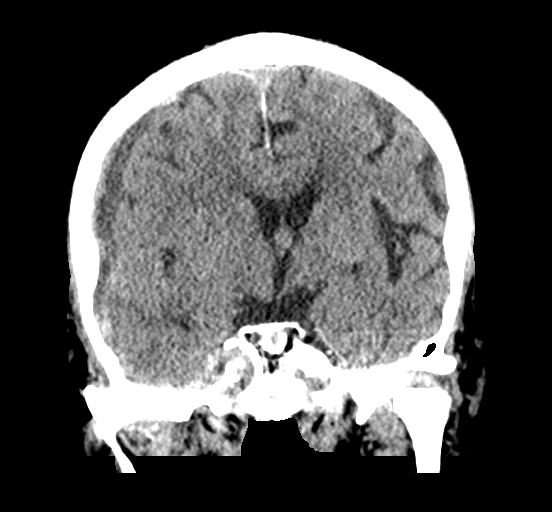
[im 42/75  brain]
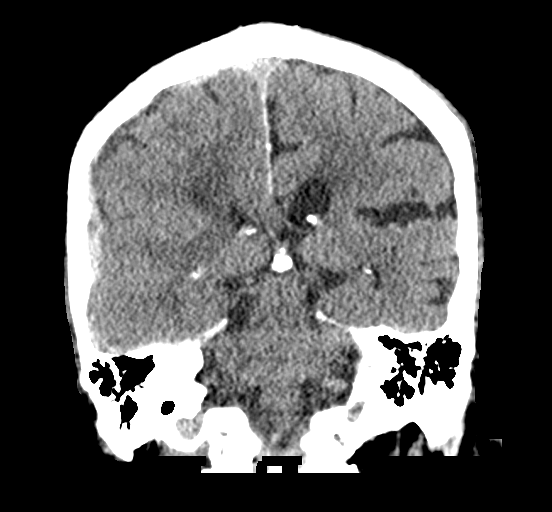

[Series 5: sag soft · sagittal · 0.37mm/px · 3 of 58 slices shown]
[im 20/58  brain]
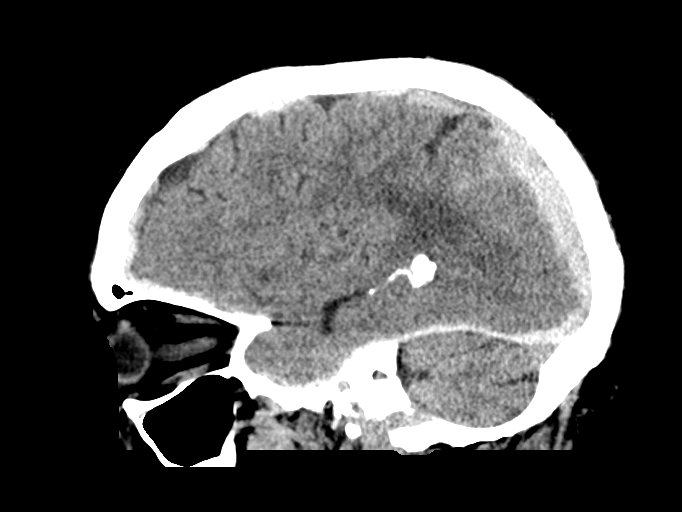
[im 29/58  brain]
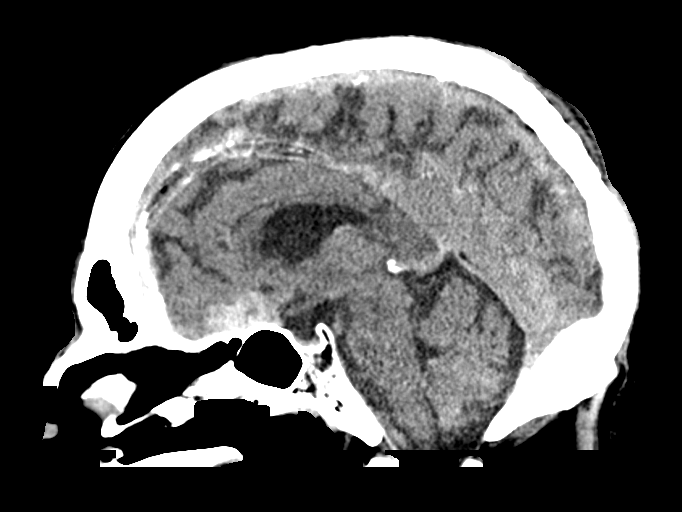
[im 39/58  brain]
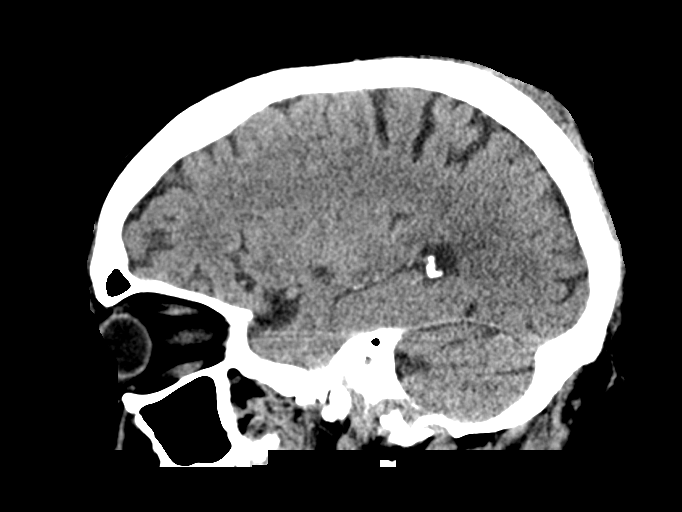

[15 of 47 positions shown; findings below may reference images not displayed]

FINDINGS: Brain: Right holo hemispheric acute subdural hematoma with maximal
thickness in the parietal region measuring 15 mm. Mass effect with
right-to-left shift of 7.5 mm. No evidence of intraparenchymal
bleeding. There may be a very small amount of subarachnoid blood on
the right. No subdural on the left. No hydrocephalus. No sign of
focal brain infarction. Chronic small-vessel changes of the deep
white matter.

Vascular: There is atherosclerotic calcification of the major
vessels at the base of the brain.

Skull: No skull fracture.

Sinuses/Orbits: Clear/normal

Other: Left parietal scalp hematoma.
IMPRESSION: Left parietal scalp hematoma. Acute right holo hemispheric subdural
hematoma with maximal thickness in the right posterior parietal
region measuring 15 mm. Right-to-left shift of 7.5 mm.

Critical Value/emergent results were called by telephone at the time
of interpretation on 12/03/2016 at [DATE] to Dr. JHOANI PION , who
verbally acknowledged these results.

## 2018-06-11 ENCOUNTER — Encounter: Payer: Self-pay | Admitting: Cardiovascular Disease

## 2018-06-11 ENCOUNTER — Ambulatory Visit: Payer: Medicare Other | Admitting: Cardiovascular Disease

## 2018-06-11 VITALS — BP 110/62 | HR 75 | Ht 74.0 in | Wt 242.8 lb

## 2018-06-11 DIAGNOSIS — I4821 Permanent atrial fibrillation: Secondary | ICD-10-CM

## 2018-06-11 DIAGNOSIS — I482 Chronic atrial fibrillation: Secondary | ICD-10-CM

## 2018-06-11 DIAGNOSIS — I5032 Chronic diastolic (congestive) heart failure: Secondary | ICD-10-CM | POA: Diagnosis not present

## 2018-06-11 NOTE — Patient Instructions (Addendum)
Medication Instructions:  Your provider recommends that you continue on your current medications as directed. Please refer to the Current Medication list given to you today.    Labwork: None  Testing/Procedures: None  Follow-Up: You are scheduled to see Dr. Excell Seltzerooper on December 4th at 1:40PM.  Any Other Special Instructions Will Be Listed Below (If Applicable).     If you need a refill on your cardiac medications before your next appointment, please call your pharmacy.

## 2018-06-11 NOTE — Progress Notes (Signed)
Cardiology Office Note Date:  06/12/2018   ID:  Alex DuhamelJohn R [name], DOB April 04, 1931, MRN [id number]  PCP:  Alex MilesWeaver, Denise W., MD  Cardiologist:  Alex BollmanMichael Sibyl Mikula, MD    Chief Complaint  Patient presents with  . Shortness of Breath     History of Present Illness: Alex DuhamelJohn R [name] is a 84 y.o. male who presents for follow-up of chronic diastolic heart failure. Other problems include CAD s/p CABG, aortic stenosis s/p bioprosthetic AVR, carotid disease s/p CEA, persistent atrial fibrillation, and subdural hematoma.  He is here with his wife and daughter today. States he's doing well and denies chest pain, shortness of breath at rest, edema, orthopnea, or PND. He's off of home O2. His activity is very limited and he's not up and around much at all anymore (in a wheelchair today). He's compliant with his medications.   Past Medical History:  Diagnosis Date  . Anxiety   . Aortic stenosis    s/p AVR by Dr Laneta SimmersBartle  . COPD (chronic obstructive pulmonary disease) (HCC)   . History of coronary artery disease    status post stenting of the marginal circumflex in 12/2003 and again in 2009  . Hyperlipidemia   . Insomnia   . Morbid obesity (HCC)    weight 243 pounds, BMI 31.2kg/m2, BSA 2.36 square meters  . Obstructive sleep apnea    compliant with CPAP  . Persistent atrial fibrillation (HCC)   . Seizure (HCC)   . Traumatic subdural hematoma without loss of consciousness [facility](HCC)     Past Surgical History:  Procedure Laterality Date  . AORTIC VALVE REPLACEMENT (AVR)/CORONARY ARTERY BYPASS GRAFTING (CABG)   08/05/2011   LIMA to LAD, sequential saphenous vein graft to third and fourth obtuse marginal branches of the circumflex, aortic valve replacement using a 23 mm Edwards pericardial valve  . APPENDECTOMY    . CARDIOVERSION N/A 11/14/2014   Procedure: CARDIOVERSION;  Surgeon: Donato SchultzMark Skains, MD;  [facility];  Service: Cardiovascular;  Laterality: N/A;  . CARDIOVERSION N/A 11/20/2016   Procedure:  CARDIOVERSION;  Surgeon: Pricilla RifflePaula Ross V, MD;  [facility];  Service: Cardiovascular;  Laterality: N/A;  . CAROTID ENDARTERECTOMY     Dr Lollie Sailsale Williams  . CRANIOTOMY Right 12/16/2016   Procedure: CRANIOTOMY HEMATOMA EVACUATION SUBDURAL;  Surgeon: Loura HaltBenjamin Jared Ditty, MD;  [facility];  Service: Neurosurgery;  Laterality: Right;  . EP IMPLANTABLE DEVICE N/A 11/06/2015   Procedure: PPM Generator Changeout;for sick sinus syndrome with a MDT Adapta L PPM, chronically elevated RV threshold.  . permanent pacemaker     MDT EnRhythm implanted by Dr Lawanda CousinsAl-Kori in [place] for complete heart block with syncope  . REPLACEMENT TOTAL KNEE BILATERAL     2006  . TEE WITHOUT CARDIOVERSION N/A 11/14/2014   Procedure: TRANSESOPHAGEAL ECHOCARDIOGRAM (TEE);  Surgeon: Donato SchultzMark Skains, MD;  [facility];  Service: Cardiovascular;  Laterality: N/A;  . TRANSURETHRAL INCISION OF PROSTATE N/A 01/29/2017   Procedure: TRANSURETHRAL UNROOFING OF A  PROSTATE ABSCESS;  Surgeon: Ihor GullyMark Ottelin, MD;  Location: [facility] ORS;  Service: Urology;  Laterality: N/A;    Current Outpatient Medications  Medication Sig Dispense Refill  . amiodarone (PACERONE) 200 MG tablet Take 1 tablet (200 mg total) by mouth 2 (two) times daily. 180 tablet 3  . aspirin EC 81 MG tablet Take 81 mg daily by mouth.    Marland Kitchen. atorvastatin (LIPITOR) 80 MG tablet TAKE 1 TABLET BY MOUTH DAILY (Patient taking differently: TAKE 1 TABLET (80mg ) BY MOUTH DAILY)  90 tablet 1  . divalproex (DEPAKOTE ER) 500 MG 24 hr tablet Take 2 tablets (1,000 mg total) by mouth at bedtime. 60 tablet 11  . escitalopram (LEXAPRO) 10 MG tablet Take 10 mg by mouth daily.    . furosemide (LASIX) 20 MG tablet Take 2 tabs in the AM and 1 tab in the PM    . memantine (NAMENDA) 10 MG tablet Take 1 tablet (10 mg total) by mouth 2 (two) times daily. 180 tablet 3  . potassium chloride (K-DUR,KLOR-CON) 10 MEQ tablet Take 1 tablet (10 mEq total) by mouth 2 (two) times daily. 60 tablet 3  .  PROAIR HFA 108 (90 Base) MCG/ACT inhaler Inhale 2 puffs into the lungs every 6 (six) hours as needed for shortness of breath. wheezing  11  . traZODone (DESYREL) 50 MG tablet Take 50 mg by mouth at bedtime.  5   No current facility-administered medications for this visit.     Allergies:   Patient has no known allergies.   Social History:  The patient  reports that he quit smoking about 14 years ago. His smoking use included cigarettes. He started smoking about 44 years ago. He has a 15.00 pack-year smoking history. He has never used smokeless tobacco. He reports that he drinks alcohol. He reports that he does not use drugs.   Family History:  The patient's  family history includes Alzheimer's disease in his mother; Other in his father; Tuberculosis in his father.    ROS:  Please see the history of present illness.  Otherwise, review of systems is positive for gait instability.  All other systems are reviewed and negative.    PHYSICAL EXAM: VS:  BP 110/62   Pulse 75   Ht 6\' 2"  (1.88 m)   Wt 242 lb 12.8 oz (110.1 kg)   SpO2 97%   BMI 31.17 kg/m  , BMI Body mass index is 31.17 kg/m. GEN: elderly male, in no acute distress  HEENT: normal  Neck: no JVD, no masses. BL carotid bruits Cardiac: irregular with soft SEM at the RUSB, no diastolic murmur               Respiratory:  clear to auscultation bilaterally, normal work of breathing GI: soft, nontender, nondistended, + BS MS: no deformity or atrophy  Ext: trace bilateral pretibial edema Skin: warm and dry, no rash Neuro:  Strength and sensation are intact Psych: euthymic mood, full affect  EKG:  EKG is not ordered today.  Recent Labs: 07/27/2017: TSH 2.098 08/07/2017: ALT 29; B Natriuretic Peptide 351.7; Hemoglobin 10.3; Platelets 213 08/10/2017: Magnesium 1.8 09/30/2017: BUN 29; Creatinine, Ser 1.48; Potassium 3.8; Sodium 138   Lipid Panel  No results found for: CHOL, TRIG, HDL, CHOLHDL, VLDL, LDLCALC, LDLDIRECT    Wt  Readings from Last 3 Encounters:  06/11/18 242 lb 12.8 oz (110.1 kg)  04/19/18 242 lb 3.2 oz (109.9 kg)  03/08/18 236 lb 6.4 oz (107.2 kg)     Cardiac Studies Reviewed: 2D Echo 07-28-2017: Study Conclusions  - Procedure narrative: Transthoracic echocardiography. Image   quality was poor. The study was technically difficult, as a   result of poor acoustic windows, poor sound wave transmission,   and body habitus. - Left ventricle: The cavity size was normal. Wall thickness was   increased in a pattern of moderate LVH. Systolic function was   normal. The estimated ejection fraction was in the range of 50%   to 55%. Wall motion was normal; there were  no regional wall   motion abnormalities. - Aortic valve: Mildly calcified annulus. - Mitral valve: Mildly calcified annulus. Mildly thickened, mildly   calcified leaflets . Valve area by continuity equation (using   LVOT flow): 1.12 cm^2. - Right ventricle: Systolic function was mildly reduced. - Right atrium: The atrium was mildly dilated. - Pulmonary arteries: Systolic pressure was moderately increased.   PA peak pressure: 51 mm Hg (S).  ASSESSMENT AND PLAN: 1.  Chronic diastolic heart failure, NYHA II symptoms. Overall appears stable. Would continue current diuretic regimen. Reviewed recent labs from PCP demonstrating stable renal function. FU 4 months.   2. Permanent atrial fibrillation: not a candidate for anticoagulation with hx severe subdural hematoma, chronic gait instability. Rate controlled with amiodarone.   3. CAD without angina: treated with ASA and a statin drug.  4. Aortic valve disease s/p bioprosthetic AVR: normal valve function by echo and exam. On ASA.   5. CKD 3: reviewed recent creatinine which is improved from past labs. Appears stable.   6. Carotid disease: last duplex 2017. He is asymptomatic. We discussed further surveillance and they decline. I agree considering his current health status that  surveillance/screening studies no longer indicated.   Current medicines are reviewed with the patient today.  The patient does not have concerns regarding medicines.  Labs/ tests ordered today include:  No orders of the defined types were placed in this encounter.   Disposition:   FU 4 months  Signed, Alex BollmanMichael Sennie Borden, MD  06/12/2018 6:46 AM    [facility] [address], [place], [place]  [address] Phone: [phone]; Fax: [phone]

## 2018-06-12 ENCOUNTER — Encounter: Payer: Self-pay | Admitting: Cardiovascular Disease

## 2018-06-28 LAB — CUP PACEART REMOTE DEVICE CHECK
Battery Impedance: 206 Ohm
Battery Remaining Longevity: 85 mo
Battery Voltage: 2.78 V
Brady Statistic AP VP Percent: 16 %
Brady Statistic AS VP Percent: 72 %
Date Time Interrogation Session: 20190725185411
Implantable Lead Implant Date: 20061113
Implantable Lead Location: 753860
Implantable Lead Model: 5076
Implantable Lead Model: 5594
Implantable Pulse Generator Implant Date: 20170110
Lead Channel Impedance Value: 1931 Ohm
Lead Channel Impedance Value: 365 Ohm
Lead Channel Pacing Threshold Amplitude: 0.625 V
Lead Channel Pacing Threshold Pulse Width: 0.4 ms
Lead Channel Setting Pacing Amplitude: 4 V
Lead Channel Setting Sensing Sensitivity: 2 mV
MDC IDC LEAD IMPLANT DT: 20061113
MDC IDC LEAD LOCATION: 753859
MDC IDC SET LEADCHNL RA PACING AMPLITUDE: 2 V
MDC IDC SET LEADCHNL RV PACING PULSEWIDTH: 1.5 ms
MDC IDC STAT BRADY AP VS PERCENT: 0 %
MDC IDC STAT BRADY AS VS PERCENT: 12 %

## 2018-07-07 MED FILL — AMIODARONE HCL 200 MG TABS: 200 | 90 days supply | Qty: 180 | Fill #1

## 2018-07-07 MED FILL — DIVALPROEX SOD ER 500 MG TA: 500 | 30 days supply | Qty: 60 | Fill #2

## 2018-07-22 MED FILL — traZODone HCL 50 MG TABS: 50 | 30 days supply | Qty: 30 | Fill #0

## 2018-07-29 MED FILL — POTASSIUM CHL ER M10 TABLET: 10 | 30 days supply | Qty: 60 | Fill #2

## 2018-07-29 MED FILL — MAGNESIUM OXIDE 400 MG TAB: 400 (240 MG | 120 days supply | Qty: 120 | Fill #1

## 2018-08-03 MED FILL — LORazepam 0.5 MG TABS: 0.5 | 5 days supply | Qty: 30 | Fill #0

## 2018-08-03 MED FILL — STOOL SOFTENER-LAXATIVE TAB: 50-8.6 | 50 days supply | Qty: 100 | Fill #0

## 2018-08-09 MED FILL — DIVALPROEX SOD ER 500 MG TA: 500 | 30 days supply | Qty: 60 | Fill #3

## 2018-08-13 MED FILL — QUETIAPINE FUMARATE 50 MG T: 50 | 7 days supply | Qty: 30 | Fill #0

## 2018-08-18 MED FILL — LORazepam 0.5 MG TABS: 0.5 | 10 days supply | Qty: 60 | Fill #0

## 2018-08-18 MED FILL — traZODone HCL 100 MG TABS: 100 | 30 days supply | Qty: 30 | Fill #0

## 2018-08-19 ENCOUNTER — Encounter: Payer: Medicare Other | Admitting: *Deleted

## 2018-08-19 ENCOUNTER — Telehealth: Payer: Self-pay

## 2018-08-19 NOTE — Telephone Encounter (Signed)
Received message that Hospice is calling for orders.  Called to inform Hospice that no orders are in the system and Dr. Excell Seltzer has not been here to sign.   Left message for her to call back to discuss.

## 2018-08-19 NOTE — Telephone Encounter (Signed)
Confirmed remote transmission w/ pt wife.   

## 2018-08-19 NOTE — Telephone Encounter (Signed)
Shavone from Hospice states Haynes Bast personally spoke with Dr. Excell Seltzer via his cell phone and he said he would sign Hospice orders. Informed her Dr. Excell Seltzer is not in the office, but he will be Monday and if papers are signed they will be faxed back. She was grateful for call and agrees with treatment plan.

## 2018-08-19 NOTE — Telephone Encounter (Signed)
Follow up:   Nurse Vito Berger  can also be page [phone] returning call

## 2018-08-20 ENCOUNTER — Ambulatory Visit (INDEPENDENT_AMBULATORY_CARE_PROVIDER_SITE_OTHER): Payer: Medicare Other | Admitting: *Deleted

## 2018-08-20 ENCOUNTER — Encounter: Payer: Self-pay | Admitting: Cardiology

## 2018-08-20 DIAGNOSIS — I442 Atrioventricular block, complete: Secondary | ICD-10-CM | POA: Diagnosis not present

## 2018-08-21 NOTE — Progress Notes (Signed)
Remote pacemaker transmission.   

## 2018-08-23 ENCOUNTER — Encounter: Payer: Self-pay | Admitting: Cardiology

## 2018-08-23 NOTE — Telephone Encounter (Signed)
Dr. Excell Seltzer signed orders. Placed in Medical Records' "to be faxed" bin.

## 2018-09-10 LAB — CUP PACEART REMOTE DEVICE CHECK
Battery Impedance: 206 Ohm
Battery Voltage: 2.79 V
Brady Statistic AP VP Percent: 14 %
Brady Statistic AP VS Percent: 0 %
Brady Statistic AS VP Percent: 77 %
Implantable Lead Implant Date: 20061113
Implantable Lead Location: 753860
Implantable Lead Model: 5076
Implantable Lead Model: 5594
Implantable Pulse Generator Implant Date: 20170110
Lead Channel Impedance Value: 2062 Ohm
Lead Channel Impedance Value: 388 Ohm
Lead Channel Sensing Intrinsic Amplitude: 2.8 mV
Lead Channel Setting Pacing Amplitude: 2 V
Lead Channel Setting Pacing Amplitude: 4 V
Lead Channel Setting Pacing Pulse Width: 1.5 ms
MDC IDC LEAD IMPLANT DT: 20061113
MDC IDC LEAD LOCATION: 753859
MDC IDC MSMT BATTERY REMAINING LONGEVITY: 85 mo
MDC IDC MSMT LEADCHNL RA PACING THRESHOLD AMPLITUDE: 0.625 V
MDC IDC MSMT LEADCHNL RA PACING THRESHOLD PULSEWIDTH: 0.4 ms
MDC IDC SESS DTM: 20191025223114
MDC IDC SET LEADCHNL RV SENSING SENSITIVITY: 2 mV
MDC IDC STAT BRADY AS VS PERCENT: 9 %

## 2018-09-10 MED FILL — POTASSIUM CHL ER M10 TABLET: 10 | 30 days supply | Qty: 60 | Fill #3

## 2018-09-10 MED FILL — DIVALPROEX SOD ER 500 MG TA: 500 | 30 days supply | Qty: 60 | Fill #4

## 2018-09-24 MED FILL — traZODone HCL 100 MG TABS: 100 | 30 days supply | Qty: 30 | Fill #0

## 2018-09-27 NOTE — Progress Notes (Deleted)
Cardiology Office Note Date:  09/27/2018  Patient ID:  Alex, [name] 1931/08/29, MRN [id number] PCP:  [name]., MD  Cardiologist:  Dr. Excell Seltzer  ***refresh   Chief Complaint: ***  History of Present Illness: Alex [name] is a 84 y.o. male with history of CAD (CABG), VHD w/AS s/p AVR (bioprosthetic, 2012), PVD s/p *** CEA, h/o traumatic SDH that in Feb 2018 was enlarging requiring Right frontotemporoparietal craniotomy  Evacuation ( he had prolonged hospital stays and CIR stays much of 2018 after this), AFib, tachy-brady w/PPM, HTN, chronic CHF (diastolic), COPD, obesity.  Oct 2018 had CHF hospitalizations (x2), this his last hospital stay I see.  He comes in today to be seen for Dr. Johney Frame.  He last saw him 11/2016.  He has been followed by cardiology service in and out patient in the interim.  *** symptoms *** has has a cooper visit at 1:40 *** meds *** not put back in a/c ?? Not an a/c candidate, has been maintained on amio for rate control. *** side is the CEA  Device information: MDT dual chamber PPM, implanted 09/08/05, gen change 11/06/15  Past Medical History:  Diagnosis Date  . Anxiety   . Aortic stenosis    s/p AVR by Dr Laneta Simmers  . COPD (chronic obstructive pulmonary disease) (HCC)   . History of coronary artery disease    status post stenting of the marginal circumflex in 12/2003 and again in 2009  . Hyperlipidemia   . Insomnia   . Morbid obesity (HCC)    weight 243 pounds, BMI 31.2kg/m2, BSA 2.36 square meters  . Obstructive sleep apnea    compliant with CPAP  . Persistent atrial fibrillation (HCC)   . Seizure (HCC)   . Traumatic subdural hematoma without loss of consciousness [facility])     Past Surgical History:  Procedure Laterality Date  . AORTIC VALVE REPLACEMENT (AVR)/CORONARY ARTERY BYPASS GRAFTING (CABG)   08/05/2011   LIMA to LAD, sequential saphenous vein graft to third and fourth obtuse marginal branches of the circumflex, aortic valve  replacement using a 23 mm Edwards pericardial valve  . APPENDECTOMY    . CARDIOVERSION N/A 11/14/2014   Procedure: CARDIOVERSION;  Surgeon: Donato Schultz, MD;  [facility];  Service: Cardiovascular;  Laterality: N/A;  . CARDIOVERSION N/A 11/20/2016   Procedure: CARDIOVERSION;  Surgeon: Pricilla Riffle, MD;  Location: [facility] ENDOSCOPY;  Service: Cardiovascular;  Laterality: N/A;  . CAROTID ENDARTERECTOMY     Dr Lollie Sails  . CRANIOTOMY Right 12/16/2016   Procedure: CRANIOTOMY HEMATOMA EVACUATION SUBDURAL;  Surgeon: Loura Halt Ditty, MD;  Location[facility] OR;  Service: Neurosurgery;  Laterality: Right;  . EP IMPLANTABLE DEVICE N/A 11/06/2015   Procedure: PPM Generator Changeout;for sick sinus syndrome with a MDT Adapta L PPM, chronically elevated RV threshold.  . permanent pacemaker     MDT EnRhythm implanted by Dr Lawanda Cousins in [place] for complete heart block with syncope  . REPLACEMENT TOTAL KNEE BILATERAL     2006  . TEE WITHOUT CARDIOVERSION N/A 11/14/2014   Procedure: TRANSESOPHAGEAL ECHOCARDIOGRAM (TEE);  Surgeon: Donato Schultz, MD;  Location: [facility] ENDOSCOPY;  Service: Cardiovascular;  Laterality: N/A;  . TRANSURETHRAL INCISION OF PROSTATE N/A 01/29/2017   Procedure: TRANSURETHRAL UNROOFING OF A  PROSTATE ABSCESS;  Surgeon: Ihor Gully, MD;  Location: [facility] ORS;  Service: Urology;  Laterality: N/A;    Current Outpatient Medications  Medication Sig Dispense Refill  . amiodarone (PACERONE) 200 MG tablet Take 1 tablet (200  mg total) by mouth 2 (two) times daily. 180 tablet 3  . aspirin EC 81 MG tablet Take 81 mg daily by mouth.    Marland Kitchen. atorvastatin (LIPITOR) 80 MG tablet TAKE 1 TABLET BY MOUTH DAILY (Patient taking differently: TAKE 1 TABLET (80mg ) BY MOUTH DAILY) 90 tablet 1  . divalproex (DEPAKOTE ER) 500 MG 24 hr tablet Take 2 tablets (1,000 mg total) by mouth at bedtime. 60 tablet 11  . escitalopram (LEXAPRO) 10 MG tablet Take 10 mg by mouth daily.    . furosemide (LASIX) 20 MG tablet Take 2  tabs in the AM and 1 tab in the PM    . memantine (NAMENDA) 10 MG tablet Take 1 tablet (10 mg total) by mouth 2 (two) times daily. 180 tablet 3  . potassium chloride (K-DUR,KLOR-CON) 10 MEQ tablet Take 1 tablet (10 mEq total) by mouth 2 (two) times daily. 60 tablet 3  . PROAIR HFA 108 (90 Base) MCG/ACT inhaler Inhale 2 puffs into the lungs every 6 (six) hours as needed for shortness of breath. wheezing  11  . traZODone (DESYREL) 50 MG tablet Take 50 mg by mouth at bedtime.  5   No current facility-administered medications for this visit.     Allergies:   Patient has no known allergies.   Social History:  The patient  reports that he quit smoking about 14 years ago. His smoking use included cigarettes. He started smoking about 44 years ago. He has a 15.00 pack-year smoking history. He has never used smokeless tobacco. He reports that he drinks alcohol. He reports that he does not use drugs.   Family History:  The patient's family history includes Alzheimer's disease in his mother; Other in his father; Tuberculosis in his father.***  ROS:  Please see the history of present illness. Otherwise, review of systems is positive for ***.   All other systems are reviewed and otherwise negative.   PHYSICAL EXAM: *** VS:  There were no vitals taken for this visit. BMI: There is no height or weight on file to calculate BMI. Well nourished, well developed, in no acute distress  HEENT: normocephalic, atraumatic  Neck: no JVD, carotid bruits or masses Cardiac:  *** irreg?RRR; no significant murmurs, no rubs, or gallops Lungs:  *** CTA b/l, no wheezing, rhonchi or rales  Abd: soft, nontender MS: no deformity or *** atrophy Ext: ***  no edema  Skin: warm and dry, no rash Neuro:  No gross deficits appreciated Psych: euthymic mood, full affect  *** PPM site is stable, no tethering or discomfort   EKG:  Not done today PPM interrogation done today and reviewed by myself: ***  07/28/17: TTE Study  Conclusions - Procedure narrative: Transthoracic echocardiography. Image   quality was poor. The study was technically difficult, as a   result of poor acoustic windows, poor sound wave transmission,   and body habitus. - Left ventricle: The cavity size was normal. Wall thickness was   increased in a pattern of moderate LVH. Systolic function was   normal. The estimated ejection fraction was in the range of 50%   to 55%. Wall motion was normal; there were no regional wall   motion abnormalities. - Aortic valve: Mildly calcified annulus. - Mitral valve: Mildly calcified annulus. Mildly thickened, mildly   calcified leaflets . Valve area by continuity equation (using   LVOT flow): 1.12 cm^2. - Right ventricle: Systolic function was mildly reduced. - Right atrium: The atrium was mildly dilated. - Pulmonary arteries:  Systolic pressure was moderately increased.   PA peak pressure: 51 mm Hg (S).   Recent Labs: 09/30/2017: BUN 29; Creatinine, Ser 1.48; Potassium 3.8; Sodium 138  No results found for requested labs within last 8760 hours.   CrCl cannot be calculated (Patient's most recent lab result is older than the maximum 21 days allowed.).   Wt Readings from Last 3 Encounters:  06/11/18 242 lb 12.8 oz (110.1 kg)  04/19/18 242 lb 3.2 oz (109.9 kg)  03/08/18 236 lb 6.4 oz (107.2 kg)     Other studies reviewed: Additional studies/records reviewed today include: summarized above  ASSESSMENT AND PLAN:  1. PPM     ***  2. ***permanent Afib     Off a/c s/p severe traumatic SDH requiring evacuation, unsteady gait   Disposition: F/u with ***  Current medicines are reviewed at length with the patient today.  The patient did not have any concerns regarding medicines.***  Signed, Francis Dowse, PA-C 09/27/2018 12:52 PM     [facility] [address] [place] [place] [address] [phone] (office)  [phone] (fax)

## 2018-09-29 ENCOUNTER — Ambulatory Visit: Payer: Medicare Other | Admitting: Cardiovascular Disease

## 2018-09-29 ENCOUNTER — Encounter: Payer: Medicare Other | Admitting: Physician Assistant

## 2018-09-29 MED FILL — CIPROFLOXACIN HCL 500 MG TA: 500 | 5 days supply | Qty: 10 | Fill #0

## 2018-09-29 MED FILL — QUETIAPINE 25 MG TABLET: 25 | 7 days supply | Qty: 15 | Fill #0

## 2018-10-01 MED FILL — SM STOOL SOFTENER-LAXATIVE: 8.6-50 | 15 days supply | Qty: 30 | Fill #0

## 2018-10-06 MED FILL — DIVALPROEX SOD ER 500 MG TA: 500 | 7 days supply | Qty: 14 | Fill #5

## 2018-10-06 MED FILL — AMIODARONE HCL 200 MG TABS: 200 | 15 days supply | Qty: 30 | Fill #2

## 2018-10-06 MED FILL — FUROSEMIDE 20 MG TAB: 20 | 15 days supply | Qty: 60 | Fill #0

## 2018-10-06 MED FILL — POTASSIUM CHL ER M10 TABLET: 10 | 15 days supply | Qty: 30 | Fill #4

## 2018-10-13 MED FILL — DIVALPROEX SOD ER 500 MG TA: 500 | 7 days supply | Qty: 14 | Fill #6

## 2018-10-13 MED FILL — QUETIAPINE 25 MG TABLET: 25 | 15 days supply | Qty: 30 | Fill #0

## 2018-10-13 MED FILL — LORazepam 0.5 MG TABS: 0.5 | 15 days supply | Qty: 90 | Fill #0

## 2018-10-13 MED FILL — SM STOOL SOFTENER-LAXATIVE: 8.6-50 | 15 days supply | Qty: 30 | Fill #0

## 2018-10-13 MED FILL — traMADol HCL 50 MG TABS: 50 | 15 days supply | Qty: 60 | Fill #0

## 2018-10-18 MED FILL — FUROSEMIDE 20 MG TAB: 20 | 15 days supply | Qty: 60 | Fill #1

## 2018-10-18 MED FILL — DIVALPROEX SOD ER 500 MG TA: 500 | 7 days supply | Qty: 14 | Fill #7

## 2018-10-18 MED FILL — AMIODARONE HCL 200 MG TABS: 200 | 15 days supply | Qty: 30 | Fill #3

## 2018-10-18 MED FILL — POTASSIUM CHL ER M10 TABLET: 10 | 15 days supply | Qty: 30 | Fill #5

## 2018-10-25 MED FILL — MORPHINE SULF 100 MG/5 ML S: 100 | 10 days supply | Qty: 15 | Fill #0

## 2018-10-25 MED FILL — LORazepam 0.5 MG TABS: 0.5 | 15 days supply | Qty: 90 | Fill #0

## 2018-10-25 MED FILL — HALOPERIDOL 5 MG TABS: 5 | 15 days supply | Qty: 90 | Fill #0

## 2018-11-01 MED FILL — MORPHINE SULF 100 MG/5 ML S: 100 | 10 days supply | Qty: 30 | Fill #0

## 2018-11-03 ENCOUNTER — Telehealth: Payer: Self-pay | Admitting: Nurse Practitioner

## 2018-11-03 NOTE — Telephone Encounter (Signed)
wife called to inform pt passed on 27-Nov-2018

## 2018-11-08 ENCOUNTER — Ambulatory Visit: Payer: Medicare Other | Admitting: Nurse Practitioner

## 2018-11-09 ENCOUNTER — Telehealth: Payer: Self-pay | Admitting: Cardiovascular Disease

## 2018-11-09 NOTE — Telephone Encounter (Signed)
Called Shavonne with Hospice and told her that per chart, papers have been signed and sent to medical records to be faxed. Vito Berger stated she has not received fax yet. Will forward to Medical Records and Dr. Earmon [place] nurse to follow up.

## 2018-11-09 NOTE — Telephone Encounter (Signed)
Follow Up:     Shavonne from [facility], calling to check on the status of the orders  Faxed on 11-04-18. It was for a Plan of Care and for Supplement.

## 2018-11-12 ENCOUNTER — Telehealth: Payer: Self-pay | Admitting: Cardiovascular Disease

## 2018-11-12 NOTE — Telephone Encounter (Signed)
F/U Message       Shovonne called today checking status on paper work. I spoke to [place], Orpha Bur states Dr. Excell Seltzer will sign and she will fax everything over today. Just an [place]

## 2018-11-12 NOTE — Telephone Encounter (Signed)
Paperwork signed by Dr. Excell Seltzer and placed in Medical Records' to be faxed.

## 2018-11-27 DEATH — deceased
